# Patient Record
Sex: Male | Born: 1960 | Race: Black or African American | Hispanic: No | Marital: Single | Smoking: Former smoker
Health system: Southern US, Community
[De-identification: ages and names within clinical notes are randomized; demographics above are authoritative.]

## PROBLEM LIST (undated history)

## (undated) ENCOUNTER — Inpatient Hospital Stay: Admission: EM | Payer: Self-pay | Source: Home / Self Care

## (undated) DIAGNOSIS — G8929 Other chronic pain: Secondary | ICD-10-CM

## (undated) DIAGNOSIS — F419 Anxiety disorder, unspecified: Secondary | ICD-10-CM

## (undated) DIAGNOSIS — E7800 Pure hypercholesterolemia, unspecified: Secondary | ICD-10-CM

## (undated) DIAGNOSIS — K219 Gastro-esophageal reflux disease without esophagitis: Secondary | ICD-10-CM

## (undated) DIAGNOSIS — F32A Depression, unspecified: Secondary | ICD-10-CM

## (undated) DIAGNOSIS — D126 Benign neoplasm of colon, unspecified: Secondary | ICD-10-CM

## (undated) DIAGNOSIS — M109 Gout, unspecified: Secondary | ICD-10-CM

## (undated) DIAGNOSIS — R200 Anesthesia of skin: Secondary | ICD-10-CM

## (undated) DIAGNOSIS — F329 Major depressive disorder, single episode, unspecified: Secondary | ICD-10-CM

## (undated) DIAGNOSIS — G5793 Unspecified mononeuropathy of bilateral lower limbs: Secondary | ICD-10-CM

## (undated) DIAGNOSIS — M542 Cervicalgia: Secondary | ICD-10-CM

## (undated) DIAGNOSIS — M5416 Radiculopathy, lumbar region: Secondary | ICD-10-CM

## (undated) DIAGNOSIS — F1910 Other psychoactive substance abuse, uncomplicated: Secondary | ICD-10-CM

## (undated) DIAGNOSIS — I639 Cerebral infarction, unspecified: Secondary | ICD-10-CM

## (undated) DIAGNOSIS — K649 Unspecified hemorrhoids: Secondary | ICD-10-CM

## (undated) DIAGNOSIS — I10 Essential (primary) hypertension: Secondary | ICD-10-CM

## (undated) DIAGNOSIS — H538 Other visual disturbances: Secondary | ICD-10-CM

## (undated) DIAGNOSIS — M549 Dorsalgia, unspecified: Secondary | ICD-10-CM

## (undated) DIAGNOSIS — M1990 Unspecified osteoarthritis, unspecified site: Secondary | ICD-10-CM

## (undated) DIAGNOSIS — G629 Polyneuropathy, unspecified: Secondary | ICD-10-CM

## (undated) HISTORY — DX: Major depressive disorder, single episode, unspecified: F32.9

## (undated) HISTORY — DX: Gastro-esophageal reflux disease without esophagitis: K21.9

## (undated) HISTORY — DX: Other visual disturbances: H53.8

## (undated) HISTORY — PX: HAND SURGERY: SHX662

## (undated) HISTORY — DX: Other psychoactive substance abuse, uncomplicated: F19.10

## (undated) HISTORY — DX: Unspecified hemorrhoids: K64.9

## (undated) HISTORY — DX: Cervicalgia: M54.2

## (undated) HISTORY — DX: Dorsalgia, unspecified: M54.9

## (undated) HISTORY — PX: FINGER SURGERY: SHX640

## (undated) HISTORY — DX: Benign neoplasm of colon, unspecified: D12.6

## (undated) HISTORY — DX: Depression, unspecified: F32.A

## (undated) HISTORY — DX: Unspecified osteoarthritis, unspecified site: M19.90

---

## 1999-05-03 ENCOUNTER — Encounter: Payer: Self-pay | Admitting: Emergency Medicine

## 1999-05-03 ENCOUNTER — Emergency Department (HOSPITAL_COMMUNITY): Admission: EM | Admit: 1999-05-03 | Discharge: 1999-05-03 | Payer: Self-pay | Admitting: Emergency Medicine

## 2001-05-20 ENCOUNTER — Ambulatory Visit (HOSPITAL_COMMUNITY): Admission: RE | Admit: 2001-05-20 | Discharge: 2001-05-20 | Payer: Self-pay | Admitting: Unknown Physician Specialty

## 2001-05-20 ENCOUNTER — Encounter: Payer: Self-pay | Admitting: Family Medicine

## 2002-01-15 ENCOUNTER — Encounter: Payer: Self-pay | Admitting: Urology

## 2002-01-15 ENCOUNTER — Ambulatory Visit (HOSPITAL_COMMUNITY): Admission: RE | Admit: 2002-01-15 | Discharge: 2002-01-15 | Payer: Self-pay | Admitting: Urology

## 2004-04-05 ENCOUNTER — Ambulatory Visit: Payer: Self-pay | Admitting: Family Medicine

## 2004-04-05 ENCOUNTER — Ambulatory Visit (HOSPITAL_COMMUNITY): Admission: RE | Admit: 2004-04-05 | Discharge: 2004-04-05 | Payer: Self-pay | Admitting: Family Medicine

## 2004-07-18 ENCOUNTER — Inpatient Hospital Stay (HOSPITAL_COMMUNITY): Admission: EM | Admit: 2004-07-18 | Discharge: 2004-07-20 | Payer: Self-pay | Admitting: Emergency Medicine

## 2006-03-13 DIAGNOSIS — D126 Benign neoplasm of colon, unspecified: Secondary | ICD-10-CM

## 2006-03-13 HISTORY — DX: Benign neoplasm of colon, unspecified: D12.6

## 2006-04-09 ENCOUNTER — Ambulatory Visit: Payer: Self-pay | Admitting: Family Medicine

## 2006-04-17 ENCOUNTER — Encounter (INDEPENDENT_AMBULATORY_CARE_PROVIDER_SITE_OTHER): Payer: Self-pay | Admitting: *Deleted

## 2006-04-17 ENCOUNTER — Encounter: Payer: Self-pay | Admitting: Family Medicine

## 2006-04-17 LAB — CONVERTED CEMR LAB
ALT: 16 units/L (ref 0–53)
AST: 25 units/L (ref 0–37)
Alkaline Phosphatase: 69 units/L (ref 39–117)
BUN: 14 mg/dL (ref 6–23)
Basophils Relative: 1 % (ref 0–1)
Calcium: 9.4 mg/dL (ref 8.4–10.5)
Cholesterol: 246 mg/dL — ABNORMAL HIGH (ref 0–200)
Creatinine, Ser: 1.37 mg/dL (ref 0.40–1.50)
Eosinophils Absolute: 0.2 10*3/uL (ref 0.0–0.7)
Eosinophils Relative: 5 % (ref 0–5)
Hemoglobin, Urine: NEGATIVE
Indirect Bilirubin: 0.5 mg/dL (ref 0.0–0.9)
Ketones, ur: NEGATIVE mg/dL
MCHC: 33.6 g/dL (ref 30.0–36.0)
MCV: 88 fL (ref 78.0–100.0)
Monocytes Relative: 11 % (ref 3–11)
Neutrophils Relative %: 44 % (ref 43–77)
PSA: 0.24 ng/mL
Potassium: 4.8 meq/L (ref 3.5–5.3)
RBC: 4.93 M/uL (ref 4.22–5.81)
Specific Gravity, Urine: 1.016 (ref 1.005–1.03)
Total Protein: 8.1 g/dL (ref 6.0–8.3)
Triglycerides: 170 mg/dL — ABNORMAL HIGH (ref <150)
Urine Glucose: NEGATIVE mg/dL
pH: 5.5 (ref 5.0–8.0)

## 2006-05-21 ENCOUNTER — Ambulatory Visit: Payer: Self-pay | Admitting: Family Medicine

## 2006-07-25 ENCOUNTER — Encounter: Payer: Self-pay | Admitting: Family Medicine

## 2006-07-25 LAB — CONVERTED CEMR LAB
HDL: 60 mg/dL (ref >39)
LDL Cholesterol: 122 mg/dL — ABNORMAL HIGH (ref 0–99)
Total CHOL/HDL Ratio: 3.8
Triglycerides: 226 mg/dL — ABNORMAL HIGH (ref <150)

## 2006-07-26 ENCOUNTER — Encounter: Payer: Self-pay | Admitting: Family Medicine

## 2006-07-26 LAB — CONVERTED CEMR LAB
AST: 22 units/L (ref 0–37)
Albumin: 4.2 g/dL (ref 3.5–5.2)
Total Bilirubin: 0.6 mg/dL (ref 0.3–1.2)
Total Protein: 7.6 g/dL (ref 6.0–8.3)

## 2006-08-14 ENCOUNTER — Ambulatory Visit: Payer: Self-pay | Admitting: Family Medicine

## 2006-08-28 ENCOUNTER — Encounter: Payer: Self-pay | Admitting: Family Medicine

## 2006-08-28 LAB — CONVERTED CEMR LAB
Hemoglobin, Urine: NEGATIVE
Ketones, ur: NEGATIVE mg/dL
Leukocytes, UA: NEGATIVE
Nitrite: NEGATIVE
Specific Gravity, Urine: 1.016 (ref 1.005–1.03)
Urobilinogen, UA: 0.2 (ref 0.0–1.0)
pH: 5.5 (ref 5.0–8.0)

## 2006-10-04 ENCOUNTER — Ambulatory Visit: Payer: Self-pay | Admitting: Gastroenterology

## 2006-10-09 ENCOUNTER — Ambulatory Visit: Payer: Self-pay | Admitting: Gastroenterology

## 2006-10-09 ENCOUNTER — Ambulatory Visit (HOSPITAL_COMMUNITY): Admission: RE | Admit: 2006-10-09 | Discharge: 2006-10-09 | Payer: Self-pay | Admitting: Gastroenterology

## 2006-10-09 ENCOUNTER — Encounter: Payer: Self-pay | Admitting: Gastroenterology

## 2006-10-09 HISTORY — PX: COLONOSCOPY: SHX174

## 2006-11-08 ENCOUNTER — Encounter: Payer: Self-pay | Admitting: Family Medicine

## 2006-11-08 LAB — CONVERTED CEMR LAB
AST: 25 units/L (ref 0–37)
Albumin: 4.2 g/dL (ref 3.5–5.2)
Alkaline Phosphatase: 53 units/L (ref 39–117)
HDL: 59 mg/dL (ref >39)
Indirect Bilirubin: 0.5 mg/dL (ref 0.0–0.9)
LDL Cholesterol: 90 mg/dL (ref 0–99)
Total Bilirubin: 0.6 mg/dL (ref 0.3–1.2)

## 2006-11-14 ENCOUNTER — Ambulatory Visit: Payer: Self-pay | Admitting: Family Medicine

## 2006-12-03 ENCOUNTER — Ambulatory Visit: Payer: Self-pay | Admitting: Orthopedic Surgery

## 2007-03-14 ENCOUNTER — Encounter: Payer: Self-pay | Admitting: Family Medicine

## 2007-03-28 ENCOUNTER — Ambulatory Visit: Payer: Self-pay | Admitting: Family Medicine

## 2007-04-03 ENCOUNTER — Encounter: Payer: Self-pay | Admitting: Family Medicine

## 2007-04-03 LAB — CONVERTED CEMR LAB
ALT: 14 units/L (ref 0–53)
AST: 28 units/L (ref 0–37)
Albumin: 4.6 g/dL (ref 3.5–5.2)
Alkaline Phosphatase: 57 units/L (ref 39–117)
Basophils Absolute: 0 10*3/uL (ref 0.0–0.1)
Basophils Relative: 0 % (ref 0–1)
CO2: 23 meq/L (ref 19–32)
Calcium: 9.6 mg/dL (ref 8.4–10.5)
Cholesterol: 217 mg/dL — ABNORMAL HIGH (ref 0–200)
Creatinine, Ser: 1.24 mg/dL (ref 0.40–1.50)
Eosinophils Absolute: 0.2 10*3/uL (ref 0.0–0.7)
Eosinophils Relative: 4 % (ref 0–5)
HCT: 42 % (ref 39.0–52.0)
HDL: 71 mg/dL (ref >39)
Hemoglobin: 15 g/dL (ref 13.0–17.0)
MCHC: 35.7 g/dL (ref 30.0–36.0)
MCV: 86.1 fL (ref 78.0–100.0)
Monocytes Absolute: 0.5 10*3/uL (ref 0.1–1.0)
Platelets: 383 10*3/uL (ref 150–400)
RDW: 13.6 % (ref 11.5–15.5)
Sodium: 138 meq/L (ref 135–145)
TSH: 0.779 microintl units/mL (ref 0.350–5.50)
Total Bilirubin: 0.8 mg/dL (ref 0.3–1.2)
Total CHOL/HDL Ratio: 3.1

## 2007-04-15 ENCOUNTER — Ambulatory Visit: Payer: Self-pay | Admitting: Family Medicine

## 2007-04-20 ENCOUNTER — Ambulatory Visit (HOSPITAL_COMMUNITY): Admission: RE | Admit: 2007-04-20 | Discharge: 2007-04-20 | Payer: Self-pay | Admitting: Family Medicine

## 2007-05-08 ENCOUNTER — Ambulatory Visit: Payer: Self-pay | Admitting: Family Medicine

## 2007-05-17 ENCOUNTER — Ambulatory Visit: Payer: Self-pay | Admitting: Family Medicine

## 2007-07-10 ENCOUNTER — Encounter: Admission: RE | Admit: 2007-07-10 | Discharge: 2007-07-10 | Payer: Self-pay | Admitting: Orthopedic Surgery

## 2007-07-22 ENCOUNTER — Ambulatory Visit: Payer: Self-pay | Admitting: Family Medicine

## 2007-07-25 ENCOUNTER — Encounter (INDEPENDENT_AMBULATORY_CARE_PROVIDER_SITE_OTHER): Payer: Self-pay | Admitting: *Deleted

## 2007-07-25 ENCOUNTER — Ambulatory Visit (HOSPITAL_COMMUNITY): Admission: RE | Admit: 2007-07-25 | Discharge: 2007-07-25 | Payer: Self-pay | Admitting: Family Medicine

## 2007-07-25 ENCOUNTER — Encounter: Payer: Self-pay | Admitting: Family Medicine

## 2007-07-25 DIAGNOSIS — M542 Cervicalgia: Secondary | ICD-10-CM | POA: Insufficient documentation

## 2007-07-25 DIAGNOSIS — I10 Essential (primary) hypertension: Secondary | ICD-10-CM | POA: Insufficient documentation

## 2007-07-25 DIAGNOSIS — R109 Unspecified abdominal pain: Secondary | ICD-10-CM | POA: Insufficient documentation

## 2007-07-25 DIAGNOSIS — M549 Dorsalgia, unspecified: Secondary | ICD-10-CM | POA: Insufficient documentation

## 2007-07-25 DIAGNOSIS — E785 Hyperlipidemia, unspecified: Secondary | ICD-10-CM | POA: Insufficient documentation

## 2007-07-25 LAB — CONVERTED CEMR LAB
Albumin: 4.2 g/dL (ref 3.5–5.2)
BUN: 13 mg/dL (ref 6–23)
CO2: 22 meq/L (ref 19–32)
Chloride: 103 meq/L (ref 96–112)
HDL: 81 mg/dL (ref >39)
LDL Cholesterol: 72 mg/dL (ref 0–99)
Potassium: 4.8 meq/L (ref 3.5–5.3)
Total Bilirubin: 0.9 mg/dL (ref 0.3–1.2)
Total Protein: 7.4 g/dL (ref 6.0–8.3)
Triglycerides: 119 mg/dL (ref <150)
VLDL: 24 mg/dL (ref 0–40)

## 2007-07-26 ENCOUNTER — Encounter: Admission: RE | Admit: 2007-07-26 | Discharge: 2007-07-26 | Payer: Self-pay | Admitting: Orthopedic Surgery

## 2007-10-02 ENCOUNTER — Ambulatory Visit: Payer: Self-pay | Admitting: Family Medicine

## 2007-10-15 ENCOUNTER — Telehealth: Payer: Self-pay | Admitting: Family Medicine

## 2008-01-08 ENCOUNTER — Ambulatory Visit: Payer: Self-pay | Admitting: Family Medicine

## 2008-02-03 ENCOUNTER — Ambulatory Visit: Payer: Self-pay | Admitting: Family Medicine

## 2008-02-03 DIAGNOSIS — R209 Unspecified disturbances of skin sensation: Secondary | ICD-10-CM | POA: Insufficient documentation

## 2008-02-05 ENCOUNTER — Encounter: Payer: Self-pay | Admitting: Family Medicine

## 2008-02-17 LAB — CONVERTED CEMR LAB
ALT: <8 units/L (ref 0–53)
Albumin: 3.5 g/dL (ref 3.5–5.2)
Chloride: 103 meq/L (ref 96–112)
Cholesterol: 169 mg/dL (ref 0–200)
HDL: 47 mg/dL (ref >39)
Potassium: 3.2 meq/L — ABNORMAL LOW (ref 3.5–5.3)
Sodium: 141 meq/L (ref 135–145)
Total CHOL/HDL Ratio: 3.6
Total Protein: 6.3 g/dL (ref 6.0–8.3)
Triglycerides: 171 mg/dL — ABNORMAL HIGH (ref <150)
VLDL: 34 mg/dL (ref 0–40)

## 2008-02-19 ENCOUNTER — Encounter: Payer: Self-pay | Admitting: Family Medicine

## 2008-04-14 ENCOUNTER — Ambulatory Visit: Payer: Self-pay | Admitting: Family Medicine

## 2008-04-14 DIAGNOSIS — B353 Tinea pedis: Secondary | ICD-10-CM | POA: Insufficient documentation

## 2008-04-14 DIAGNOSIS — K5900 Constipation, unspecified: Secondary | ICD-10-CM | POA: Insufficient documentation

## 2008-05-22 ENCOUNTER — Telehealth: Payer: Self-pay | Admitting: Family Medicine

## 2008-05-26 ENCOUNTER — Ambulatory Visit: Payer: Self-pay | Admitting: Family Medicine

## 2008-05-30 DIAGNOSIS — F329 Major depressive disorder, single episode, unspecified: Secondary | ICD-10-CM | POA: Insufficient documentation

## 2008-06-16 ENCOUNTER — Ambulatory Visit: Payer: Self-pay | Admitting: Family Medicine

## 2008-06-16 LAB — CONVERTED CEMR LAB
Bilirubin Urine: NEGATIVE
Glucose, Urine, Semiquant: NEGATIVE
Specific Gravity, Urine: 1.015
Urobilinogen, UA: 0.2

## 2008-06-19 ENCOUNTER — Encounter: Payer: Self-pay | Admitting: Family Medicine

## 2008-06-30 ENCOUNTER — Ambulatory Visit: Payer: Self-pay | Admitting: Family Medicine

## 2008-06-30 DIAGNOSIS — M25529 Pain in unspecified elbow: Secondary | ICD-10-CM | POA: Insufficient documentation

## 2008-06-30 DIAGNOSIS — R599 Enlarged lymph nodes, unspecified: Secondary | ICD-10-CM | POA: Insufficient documentation

## 2008-07-17 ENCOUNTER — Encounter: Payer: Self-pay | Admitting: Family Medicine

## 2008-07-17 ENCOUNTER — Emergency Department (HOSPITAL_COMMUNITY): Admission: EM | Admit: 2008-07-17 | Discharge: 2008-07-17 | Payer: Self-pay | Admitting: Emergency Medicine

## 2008-07-20 ENCOUNTER — Ambulatory Visit: Payer: Self-pay | Admitting: Family Medicine

## 2008-07-20 DIAGNOSIS — J029 Acute pharyngitis, unspecified: Secondary | ICD-10-CM | POA: Insufficient documentation

## 2008-08-17 ENCOUNTER — Telehealth: Payer: Self-pay | Admitting: Family Medicine

## 2008-10-09 ENCOUNTER — Encounter: Payer: Self-pay | Admitting: Family Medicine

## 2008-11-18 ENCOUNTER — Ambulatory Visit: Payer: Self-pay | Admitting: Family Medicine

## 2008-11-18 DIAGNOSIS — M25519 Pain in unspecified shoulder: Secondary | ICD-10-CM | POA: Insufficient documentation

## 2008-11-18 DIAGNOSIS — M25569 Pain in unspecified knee: Secondary | ICD-10-CM | POA: Insufficient documentation

## 2008-12-15 ENCOUNTER — Ambulatory Visit: Payer: Self-pay | Admitting: Orthopedic Surgery

## 2008-12-15 DIAGNOSIS — M25819 Other specified joint disorders, unspecified shoulder: Secondary | ICD-10-CM | POA: Insufficient documentation

## 2008-12-15 DIAGNOSIS — M7580 Other shoulder lesions, unspecified shoulder: Secondary | ICD-10-CM

## 2008-12-29 ENCOUNTER — Ambulatory Visit: Payer: Self-pay | Admitting: Orthopedic Surgery

## 2008-12-29 DIAGNOSIS — M2240 Chondromalacia patellae, unspecified knee: Secondary | ICD-10-CM | POA: Insufficient documentation

## 2009-01-20 ENCOUNTER — Telehealth: Payer: Self-pay | Admitting: Orthopedic Surgery

## 2009-01-20 ENCOUNTER — Telehealth: Payer: Self-pay | Admitting: Family Medicine

## 2009-01-21 ENCOUNTER — Encounter: Payer: Self-pay | Admitting: Orthopedic Surgery

## 2009-12-09 ENCOUNTER — Encounter (INDEPENDENT_AMBULATORY_CARE_PROVIDER_SITE_OTHER): Payer: Self-pay

## 2009-12-09 ENCOUNTER — Ambulatory Visit: Payer: Self-pay | Admitting: Family Medicine

## 2009-12-09 DIAGNOSIS — N63 Unspecified lump in unspecified breast: Secondary | ICD-10-CM | POA: Insufficient documentation

## 2009-12-15 ENCOUNTER — Ambulatory Visit (HOSPITAL_COMMUNITY): Admission: RE | Admit: 2009-12-15 | Discharge: 2009-12-15 | Payer: Self-pay | Admitting: Family Medicine

## 2010-03-10 ENCOUNTER — Encounter: Payer: Self-pay | Admitting: Family Medicine

## 2010-03-10 ENCOUNTER — Ambulatory Visit: Payer: Self-pay | Admitting: Family Medicine

## 2010-03-11 DIAGNOSIS — F1010 Alcohol abuse, uncomplicated: Secondary | ICD-10-CM | POA: Insufficient documentation

## 2010-03-11 LAB — CONVERTED CEMR LAB
ALT: 18 units/L (ref 0–53)
Albumin: 4.5 g/dL (ref 3.5–5.2)
Basophils Relative: 1 % (ref 0–1)
Cholesterol: 208 mg/dL — ABNORMAL HIGH (ref 0–200)
Eosinophils Absolute: 0.1 10*3/uL (ref 0.0–0.7)
Glucose, Bld: 86 mg/dL (ref 70–99)
Lymphs Abs: 2.1 10*3/uL (ref 0.7–4.0)
MCV: 85.6 fL (ref 78.0–100.0)
Neutrophils Relative %: 45 % (ref 43–77)
PSA: 0.43 ng/mL (ref <=4.00)
Platelets: 365 10*3/uL (ref 150–400)
Potassium: 4.9 meq/L (ref 3.5–5.3)
Sodium: 139 meq/L (ref 135–145)
TSH: 0.981 microintl units/mL (ref 0.350–4.500)
Total CHOL/HDL Ratio: 3
Total Protein: 7.9 g/dL (ref 6.0–8.3)
Triglycerides: 182 mg/dL — ABNORMAL HIGH (ref <150)
VLDL: 36 mg/dL (ref 0–40)
WBC: 4.7 10*3/uL (ref 4.0–10.5)

## 2010-04-03 ENCOUNTER — Encounter: Payer: Self-pay | Admitting: Family Medicine

## 2010-04-12 NOTE — Letter (Signed)
Summary: PHONE NOTES  PHONE NOTES   Imported By: Lind Guest 08/06/2009 15:27:28  _____________________________________________________________________  External Attachment:    Type:   Image     Comment:   External Document

## 2010-04-12 NOTE — Assessment & Plan Note (Signed)
Summary: lump- room 1   Vital Signs:  Patient profile:   50 year old male Height:      74 inches Weight:      201.75 pounds BMI:     26.00 O2 Sat:      98 % on Room air Pulse rate:   88 / minute Resp:     16 per minute BP sitting:   130 / 80  (left arm)  Vitals Entered By: Adella Hare LPN (December 09, 2009 1:50 PM) CC: lump on left chest Is Patient Diabetic? No Pain Assessment Patient in pain? no        Referring Provider:  Dr Lodema Hong   CC:  lump on left chest.  History of Present Illness: Pt discovered tender lump Lt breast area approx 1 1/2 weeks ago.  No change in size since discovered.  No nipple dischg.  No FH of breast CA.  Hx of htn. Taking medications as prescribed and denies side effects.  No headache, chest pain or palpitations. Also Hx of Hyperlipidemia.  Taking chol meds also. Is overdue for labs.  No ins currently, but states he will have some in 2-3 mos.     Allergies (verified): No Known Drug Allergies  Past History:  Past medical history reviewed for relevance to current acute and chronic problems.  Past Medical History: Reviewed history from 07/25/2007 and no changes required. Current Problems:  NECK PAIN (ICD-723.1) BACK PAIN (ICD-724.5) ABDOMINAL PAIN (ICD-789.00) HYPERLIPIDEMIA (ICD-272.4) HYPERTENSION (ICD-401.9)  Review of Systems General:  Denies chills and fever. CV:  Denies chest pain or discomfort, lightheadness, and palpitations. Resp:  Denies cough and shortness of breath.  Physical Exam  General:  Well-developed,well-nourished,in no acute distress; alert,appropriate and cooperative throughout examination Head:  Normocephalic and atraumatic without obvious abnormalities. No apparent alopecia or balding. Ears:  External ear exam shows no significant lesions or deformities.  Otoscopic examination reveals clear canals, tympanic membranes are intact bilaterally without bulging, retraction, inflammation or discharge. Hearing is  grossly normal bilaterally. Nose:  External nasal examination shows no deformity or inflammation. Nasal mucosa are pink and moist without lesions or exudates. Mouth:  Oral mucosa and oropharynx without lesions or exudates.  Teeth in good repair. Neck:  No deformities, masses, or tenderness noted. Breasts:  gynecomastia bilat and L palpable mass approx 2 cm irrg, mobile,inferior to areola. Lungs:  Normal respiratory effort, chest expands symmetrically. Lungs are clear to auscultation, no crackles or wheezes. Heart:  Normal rate and regular rhythm. S1 and S2 normal without gallop, murmur, click, rub or other extra sounds. Cervical Nodes:  No lymphadenopathy noted Axillary Nodes:  No palpable lymphadenopathy Psych:  Cognition and judgment appear intact. Alert and cooperative with normal attention span and concentration. No apparent delusions, illusions, hallucinations   Impression & Recommendations:  Problem # 1:  BREAST MASS, LEFT (ICD-611.72) Assessment New  Orders: Mammogram (Screening) (Mammo) Miscellaneous Other Radiology (Misc Other Rad)  Problem # 2:  HYPERTENSION (ICD-401.9) Assessment: Comment Only  His updated medication list for this problem includes:    Lisinopril-hydrochlorothiazide 10-12.5 Mg Tabs (Lisinopril-hydrochlorothiazide) ..... One tab by mouth once daily  BP today: 130/80 Prior BP: 116/70 (11/18/2008)  Labs Reviewed: K+: 3.2 (02/05/2008) Creat: : 1.07 (02/05/2008)   Chol: 169 (02/05/2008)   HDL: 47 (02/05/2008)   LDL: 88 (02/05/2008)   TG: 171 (02/05/2008)  Problem # 3:  HYPERLIPIDEMIA (ICD-272.4) Assessment: Comment Only  His updated medication list for this problem includes:    Mevacor 20 Mg Tabs (Lovastatin) .Marland KitchenMarland KitchenMarland KitchenMarland Kitchen  One tab by mouth at bedtime  Labs Reviewed: SGOT: 15 (02/05/2008)   SGPT: <8 U/L (02/05/2008)   HDL:47 (02/05/2008), 81 (07/25/2007)  LDL:88 (02/05/2008), 72 (07/25/2007)  Chol:169 (02/05/2008), 177 (07/25/2007)  Trig:171 (02/05/2008), 119  (07/25/2007)  Complete Medication List: 1)  Lisinopril-hydrochlorothiazide 10-12.5 Mg Tabs (Lisinopril-hydrochlorothiazide) .... One tab by mouth once daily 2)  Mevacor 20 Mg Tabs (Lovastatin) .... One tab by mouth at bedtime  Patient Instructions: 1)  Please schedule a follow-up appointment in 2 months. 2)  I have ordered a mammogram and ultrasound of your Lt breast. 3)  I have refilled your blood pressure and cholesterol medication. 4)  You will need to have blood work in a couple of months when your health insurance starts. Prescriptions: MEVACOR 20 MG  TABS (LOVASTATIN) one tab by mouth at bedtime  #90 x 0   Entered and Authorized by:   Esperanza Sheets PA   Signed by:   Esperanza Sheets PA on 12/09/2009   Method used:   Electronically to        [facility]  [address] (retail)       [address]       [place], [place]  [address]       Ph: [phone]       Fax: [phone]   RxID:   [id number] LISINOPRIL-HYDROCHLOROTHIAZIDE 10-12.5 MG  TABS (LISINOPRIL-HYDROCHLOROTHIAZIDE) one tab by mouth once daily  #90 x 0   Entered and Authorized by:   Esperanza Sheets PA   Signed by:   Esperanza Sheets PA on 12/09/2009   Method used:   Electronically to        [facility]  [address] (retail)       [address]       [place], [place]  [address]       Ph: [phone]       Fax: [phone]   RxID:   [id number]

## 2010-04-12 NOTE — Letter (Signed)
Summary: CONSULTS  CONSULTS   Imported By: Lind Guest 08/06/2009 15:23:42  _____________________________________________________________________  External Attachment:    Type:   Image     Comment:   External Document

## 2010-04-12 NOTE — Letter (Signed)
Summary: X RAYS  X RAYS   Imported By: Lind Guest 08/06/2009 15:28:09  _____________________________________________________________________  External Attachment:    Type:   Image     Comment:   External Document

## 2010-04-12 NOTE — Letter (Signed)
Summary: OFFICE NOTES  OFFICE NOTES   Imported By: Lind Guest 08/06/2009 15:23:13  _____________________________________________________________________  External Attachment:    Type:   Image     Comment:   External Document

## 2010-04-12 NOTE — Letter (Signed)
Summary: DEMO  DEMO   Imported By: Lind Guest 08/06/2009 15:24:07  _____________________________________________________________________  External Attachment:    Type:   Image     Comment:   External Document

## 2010-04-12 NOTE — Letter (Signed)
Summary: Work note fax  Work note fax   Imported By: Cammie Sickle 04/06/2009 10:34:55  _____________________________________________________________________  External Attachment:    Type:   Image     Comment:   External Document

## 2010-04-12 NOTE — Letter (Signed)
Summary: Work Excuse  [facility]  [address]   [place], [place] [address]   Phone: [phone]  Fax: [phone]    Today's Date: December 09, 2009  Name of Patient: Tristan Cook  The above named patient had a medical visit today at: 1:30pm  Please take this into consideration when reviewing the time away from work/school.    Special Instructions:  [*] None  [  ] To be off the remainder of today, returning to the normal work / school schedule tomorrow.  [  ] To be off until the next scheduled appointment on ______________________.  [  ] Other ________________________________________________________________ ________________________________________________________________________   Sincerely yours,   Esperanza Sheets, PA-C

## 2010-04-12 NOTE — Letter (Signed)
Summary: HISTORY AND PHYSICAL  HISTORY AND PHYSICAL   Imported By: Lind Guest 08/06/2009 15:24:43  _____________________________________________________________________  External Attachment:    Type:   Image     Comment:   External Document

## 2010-04-12 NOTE — Letter (Signed)
Summary: LABS  LABS   Imported By: Lind Guest 08/06/2009 15:31:15  _____________________________________________________________________  External Attachment:    Type:   Image     Comment:   External Document

## 2010-04-12 NOTE — Letter (Signed)
Summary: MISC  MISC   Imported By: Lind Guest 08/06/2009 15:26:38  _____________________________________________________________________  External Attachment:    Type:   Image     Comment:   External Document
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
## 2010-07-26 NOTE — Op Note (Signed)
[name], [name]              ACCOUNT NO.:  [id number]   MEDICAL RECORD NO.:  [id number]          PATIENT TYPE:  AMB   LOCATION:  DAY                           FACILITY:  [facility]   PHYSICIAN:  Kassie Mends, M.D.      DATE OF BIRTH:  1960-05-05   DATE OF PROCEDURE:  10/09/2006  DATE OF DISCHARGE:                               OPERATIVE REPORT   PROCEDURE:  Colonoscopy with snare cautery polypectomy.   INDICATION FOR EXAM:  Tristan [name] is a 50 year old male whose brother  had colon cancer in his 54s.  He presents with heme-positive stool.   FINDINGS:  1. 3-mm pedunculated sigmoid colon polyp removed via snare cautery.  2. An 8-mm sessile hepatic flexure polyp removed via snare cautery.  3. A 6-mm descending colon polyp removed via snare cautery.  4. Small internal hemorrhoids.  5. Otherwise no masses, inflammatory changes, diverticula, or      arteriovenous malformations seen.   RECOMMENDATIONS:  1. No aspirin, NSAIDs or anticoagulation for 7 days.  2. He should follow a high fiber diet.  He was given a handout on high-      fiber diet and polyps, as well as hemorrhoids.  3. We will call Tristan [name] with results of his biopsies.  If his      polyp is adenomatous, then his children should have a screening      colonoscopy at age 3.  4. Screening colonoscopy in 5 years because he has a first-degree      relative who had colon cancer at age less than 62.   MEDICATIONS:  1. Demerol 50 mg IV.  2. Versed 5 mg IV.   PROCEDURE TECHNIQUE:  Physical exam was performed; and informed consent  was obtained from the patient after explaining the benefits, risks, and  alternatives to the procedure.  The patient was connected to the monitor  and placed in the left lateral position.  Continuous oxygen was provided  by nasal cannula and IV medicine administered through an indwelling  cannula.  After administration of sedation and rectal exam, the  patient's rectum was intubated  with the  scope and it was advanced onder direct visualization to the  cecum.  The scope was withdrawn slowly by carefully examining the color,  texture, anatomy, and integrity of the mucosa on the way out.  The  patient was recovered in endoscopy, and discharged home in satisfactory  condition.      Kassie Mends, M.D.  Electronically Signed     SM/MEDQ  D:  10/09/2006  T:  10/09/2006  Job:  [id number]   cc:   Milus Mallick. Lodema Hong, M.D.  Fax: [phone]

## 2010-07-26 NOTE — Consult Note (Signed)
NAMERIORDAN, WALLE              ACCOUNT NO.:  [id number]   MEDICAL RECORD NO.:  [id number]          PATIENT TYPE:  AMB   LOCATION:                                FACILITY:  [facility]   PHYSICIAN:  Kassie Mends, M.D.      DATE OF BIRTH:  Sep 03, 1960   DATE OF CONSULTATION:  10/04/2006  DATE OF DISCHARGE:                                 CONSULTATION   REASON FOR CONSULTATION:  Heme-positive stool.   HISTORY OF PRESENT ILLNESS:  Mr. Raffield is a 50 year old male who has a  history of hemorrhoids if he eats hot food.  He had Hemoccults checked  and they were positive.  He has a brother who is in his 31s with  colorectal cancer.  He denies any blood in stool, black tarry stools,  problems swallowing, nausea, vomiting, diarrhea, constipation or change  in his bowel habits.  He is having a bowel movement one to two times a  day.  He denies any aspirin, BC, Goody powder or NSAID use.   PAST MEDICAL HISTORY:  1. Hypertension.  2. Hyperlipidemia.  3. Hemorrhoids.   PAST SURGICAL HISTORY:  None.   ALLERGIES:  NO KNOWN DRUG ALLERGIES.   MEDICATIONS:  1. Lisinopril/HCTZ 10/12.5 daily.  2. Lovastatin 20 mg q.h.s.  3. Tylenol Extra Strength as needed.   FAMILY HISTORY:  He has no family history of breast, uterine or ovarian  cancer.   SOCIAL HISTORY:  He is separated.  He works of Radio producer.  He does not  smoke.  He drinks approximately a six pack on the weekends.   PHYSICAL EXAMINATION:  VITAL SIGNS:  Weight 200 pounds, height 6 feet 1  inch, temperature 93, blood pressure 120/80, pulse 80.  GENERAL:  He is in no apparent distress, alert and orient x4.  HEENT:  Exam is atraumatic, normocephalic.  Pupils equal, reactive to  light.  Mouth no oral lesions.  Posterior pharynx without erythema or  exudate.  NECK:  Full range of motion.  No lymphadenopathy.  LUNGS:  Clear to auscultation bilaterally.  CARDIOVASCULAR:  Regular rhythm, no murmur, normal S1-S2.  ABDOMEN:  Bowel sounds are  present, soft, nontender, nondistended.  No  rebound or guarding.  No hepatosplenomegaly, abdominal bruits or  pulsatile masses.  EXTREMITIES:  Without cyanosis, clubbing or edema.   LABORATORY DATA:  February 2008: hemoglobin 14.6, platelets 411, BUN 14,  creatinine 1.37, albumin 4.2 in May 2008 as well as total bili 0.6, alk  phos 57, AST 22, ALT 13.   ASSESSMENT:  Mr. Pursley is a 50 year old male with a first-degree  relative who had colon cancer age less than 50.  His colon cancer  screening is delayed.  He also has heme-positive stools.  Thank you for  allowing me to see Mr. Koury in consultation.  My recommendations  follow.   RECOMMENDATIONS:  1. Will set Mr. Pelzer up for a screening colonoscopy.  He will      likely need to have a colonoscopy every 5 years unless something      else is revealed on his colonoscopy.  2. He is a return patient visit to see me as needed.      Kassie Mends, M.D.  Electronically Signed     SM/MEDQ  D:  10/04/2006  T:  10/05/2006  Job:  [id number]   cc:   Milus Mallick. Lodema Hong, M.D.  Fax: [phone]

## 2010-07-29 NOTE — H&P (Signed)
NAME:  Tristan Cook, Tristan Cook              ACCOUNT NO.:  [id number]   MEDICAL RECORD NO.:  [id number]          PATIENT TYPE:  EMS   LOCATION:  ED                            FACILITY:  [facility]   PHYSICIAN:  Mobolaji B. Bakare, M.D.DATE OF BIRTH:  1960/06/21   DATE OF ADMISSION:  07/18/2004  DATE OF DISCHARGE:  LH                                HISTORY & PHYSICAL   PRIMARY CARE PHYSICIAN:  Dr. Lodema Hong.   CHIEF COMPLAINT:  Pleuritic left-sided chest pain.   HISTORY OF PRESENTING COMPLAINT:  Tristan Cook is a 50 year old African-  American male with no significant past medical history.  He noticed earlier  today left pleuritic, left-sided chest pain.  The pain is rated 8-10/10 and  is aggravated by movement and breathing.  He denies any cough or fever;  however, he had some chills earlier today.  No headaches.  No abdominal  pain.  No diarrhea or vomiting.  Denies hemoptysis, and no weight loss.  He  has about a 28 pack year of smoking.  He was evaluated in the emergency  department with a CT scan to rule out PE, sequel to a mildly positive D-  dimer.  Chest CT scan did not show any PE; however, there were infiltrates  on the left side more than right, suggestive of pneumonia right in the left  lower lobe.  He was given 1 gm of Rocephin and 500 mg of Zithromax p.o.   Pain seems to be the issue with Tristan Cook.  He needs to be admitted for  pain control, and, in addition, he does not have insurance.  He cannot  afford to buy any prescription medications at this point, he is in between  getting a job.  At the moment, he is unemployed.  Hence, he will need to be  seen by a Child psychotherapist in the morning for financial assistance regarding  medications and treatment.   PAST MEDICAL HISTORY:  None.   MEDICATIONS:  None.   ALLERGIES:  None.   FAMILY HISTORY:  He is married and has 2 children.  His father had a stroke  at the age of 12.  The patient's mom is healthy.  All other siblings have no  known medical ailment.   SOCIAL HISTORY:  He has been smoking about an average of one pack per day  since the age of 37.  He does not drink alcohol and does not use drugs.   REVIEW OF SYSTEMS:  The patient has shortness of breath.  There is no  history of sick contact at home.   PHYSICAL EXAMINATION:  VITAL SIGNS:  The vitals on arrival in the ER  revealed a temperature of 99, blood pressure 149/86, pulse 103, respiratory  rate 12, O2 saturations of 99%.  GENERAL:  The patient is alert and oriented to time, place, and person.  He  seems to be uncomfortable secondary to pain.  HEENT:  Normocephalic and atraumatic.  Not pale, anicteric.  NECK:  No elevated JVD.  No carotid bruit.  LUNGS:  Reduced air entry bibasilarly, more on the left than  on the right.  Some rales on the left lower base, but no crackles, no wheeze, no rhonchi.  CVS:  S1 and S2 regular.  No murmur, no gallop.  Tachycardic.  ABDOMEN:  Not distended, soft, nontender.  He hepatosplenomegaly.  EXTREMITIES:  No pedal edema.  No calf tenderness.  Dorsalis pedis pulse is  2+ bilaterally.  CNS:  No focal neurological deficit.  SKIN:  No rash, no petechiae.   INITIAL LABORATORY DATA:  ABG on room air revealed a pH of 7.46, PCO2 of 30,  PO2 of 84, bicarb 21, O2 saturations 97%.  D-dimer 0.61.  White cells 14.4,  hemoglobin 14.8, hematocrit 42.0, neutrophils 80%, lymphocytes 10%,  monocytes 10%.  Sodium 135, potassium 4.0, chloride 101, CO2 of 25, glucose  99, BUN 7, creatinine 1.1, calcium 9.4.  Chest CT revealed no acute PE.  Left greater than right lower lobe infiltrate suspicious for pneumonia.  Tiny left pleural effusion.   ASSESSMENT AND PLAN:  Tristan Cook is a 50 year old African-American male  presenting with chills, shortness of breath, leukocytosis, and chest CT  scan, suggestive of pneumonia bilaterally.  In addition, the patient lacks  the financial means to obtain medications, and has a significant history of   smoking.  1.  Community acquired pneumonia.  Continue Rocephin 1 gm IV daily,      Zithromax 500 mg IV daily, Tylenol 650 mg q.6h. p.r.n. for headache.      Blood culture x1 set now, and blood culture with temperature greater      than 100.6.  2.  Pleuritic chest pain secondary to above.  Use Percocet 5/325, 1-2      tablets p.o. q.3-4h. p.r.n.  Check EKG.  3.  Tobacco abuse.  Smoking cessation counseling.  The patient is willing to      quite smoking.  Will ask counselor to see, and also start nicotine patch      21 mg topical daily.  4.  Deep vein thrombosis prophylaxis.  Use Lovenox 40 mg subcutaneous daily.  5.  I will ask the social worker to see the patient regarding financial      assistance for procurement of medications on discharge.  6.  Elevated Blood Pressure - Probably secondary to pain. will monitor and      if persistent will start antihypertensives.      MBB/MEDQ  D:  07/18/2004  T:  07/18/2004  Job:  [id number]

## 2010-07-29 NOTE — Discharge Summary (Signed)
Tristan Cook, Tristan Cook              ACCOUNT NO.:  [id number]   MEDICAL RECORD NO.:  [id number]          PATIENT TYPE:  INP   LOCATION:  A322                          FACILITY:  [facility]   PHYSICIAN:  Osvaldo Shipper, MD     DATE OF BIRTH:  05/19/1960   DATE OF ADMISSION:  07/18/2004  DATE OF DISCHARGE:  05/10/2006LH                                 DISCHARGE SUMMARY   DISCHARGE DIAGNOSES:  1.  Community-acquired pneumonia.  2.  Hypertension.  3.  Nicotine abuse.   Please review H&P dictated on Jul 18, 2004 by Dr. Corky Downs for details  regarding patient's presenting illness.   BRIEF HOSPITAL COURSE:  Problem 1. COMMUNITY-ACQUIRED PNEUMONIA.  Patient  was admitted with symptoms of pleuritic left-sided chest pain.  He underwent  a CAT scan in the ER to rule out PE in which they found bilateral lower lobe  pneumonia.  CAT scan did not show any PE or any other concerning lesions.  Patient was put on antibiotics.  Patient was admitted mainly for pain  control issues because of a severe pleuritic chest pain.  On the day of  discharge patient's symptoms have improved.  He is actually ambulating down  the hall as well as going down for a cigarette smoke.  He admits that the  pain has improved significantly.  He continues to have some cough and one  episode of cough had specks of blood but nothing since then.  Patient denies  any shortness of breath or any fevers at this time.  He is saturating well  on room air and he is considered okay for discharge.   Problem 2. HYPERTENSION.  His blood pressure was found to be elevated in the  emergency room and it stayed elevated during his hospital stay.  He was  started on hydrochlorothiazide; his blood pressure is better controlled  though not completely optimal.  He will need to follow with his primary  medical doctor for any further adjustment to his medications.   Problem 3. SMOKING.  Patient was counseled about smoking cessation.  He says  he is ready  to quit smoking and requested nicotine patches.  I have  prescribed these for the patient at this time.   Problem 4. FINANCIAL PROBLEMS.  Patient does not have insurance and he  requires help with buying medications as well as with the hospital bill.  Financial counselor will see the patient prior to his discharge.   DISCHARGE MEDICATIONS:  1.  Levofloxacin 500 mg p.o. daily for 8 more days.  2.  Motrin 600 mg p.o. t.i.d. with meals for pain p.r.n.  3.  Hydrochlorothiazide 12.5 mg p.o. daily.  4.  Nicotine patch 21 mg per day.   DISCHARGE FOLLOWUP INSTRUCTIONS:  1.  Dr. Syliva Overman in 1-2 weeks to follow up on his pneumonia as well      as on blood pressure.  2.  Diet:  Low salt diet.  3.  Physical activity:  As before.   PROCEDURES PERFORMED THIS HOSPITAL STAY INCLUDE:  CT chest to rule out PE  which was ruled out  however, it showed airspace disease in both lung bases  suspicious for pneumonia.  Patient also underwent a chest x-ray which also  showed similar findings.   DISCHARGE INSTRUCTIONS:  Patient was told that if he developed high fever  which is not resolving with Tylenol or if he develops severe chest pains or  anymore moderate to large quantities of blood in his sputum he needs to call  his PMD or seek medical attention immediately.      GK/MEDQ  D:  07/20/2004  T:  07/20/2004  Job:  [id number]   cc:   Milus Mallick. Lodema Hong, M.D.  [address]  [place], [place] [address]  Fax: [phone]

## 2010-08-04 ENCOUNTER — Encounter: Payer: Self-pay | Admitting: Family Medicine

## 2010-08-09 ENCOUNTER — Ambulatory Visit: Payer: Self-pay | Admitting: Family Medicine

## 2010-10-11 ENCOUNTER — Other Ambulatory Visit: Payer: Self-pay | Admitting: Family Medicine

## 2010-10-18 ENCOUNTER — Telehealth: Payer: Self-pay | Admitting: Family Medicine

## 2010-10-18 MED ORDER — LOVASTATIN 20 MG PO TABS: 20.0000 mg | ORAL_TABLET | Freq: Every day | ORAL | Status: DC

## 2010-10-18 MED ORDER — LISINOPRIL-HYDROCHLOROTHIAZIDE 10-12.5 MG PO TABS: 1.0000 | ORAL_TABLET | Freq: Every day | ORAL | Status: DC

## 2010-10-18 NOTE — Telephone Encounter (Signed)
Sent in refills 

## 2010-10-20 ENCOUNTER — Emergency Department (HOSPITAL_COMMUNITY): Payer: Self-pay

## 2010-10-20 ENCOUNTER — Emergency Department (HOSPITAL_COMMUNITY)
Admission: EM | Admit: 2010-10-20 | Discharge: 2010-10-20 | Disposition: A | Discharge status: Home / Self Care | Payer: Self-pay | Attending: Emergency Medicine | Admitting: Emergency Medicine

## 2010-10-20 ENCOUNTER — Encounter: Payer: Self-pay | Admitting: *Deleted

## 2010-10-20 DIAGNOSIS — I10 Essential (primary) hypertension: Secondary | ICD-10-CM | POA: Insufficient documentation

## 2010-10-20 DIAGNOSIS — R7402 Elevation of levels of lactic acid dehydrogenase (LDH): Secondary | ICD-10-CM | POA: Insufficient documentation

## 2010-10-20 DIAGNOSIS — R110 Nausea: Secondary | ICD-10-CM

## 2010-10-20 DIAGNOSIS — R630 Anorexia: Secondary | ICD-10-CM | POA: Insufficient documentation

## 2010-10-20 DIAGNOSIS — R7401 Elevation of levels of liver transaminase levels: Secondary | ICD-10-CM | POA: Insufficient documentation

## 2010-10-20 DIAGNOSIS — R748 Abnormal levels of other serum enzymes: Secondary | ICD-10-CM

## 2010-10-20 DIAGNOSIS — E7800 Pure hypercholesterolemia, unspecified: Secondary | ICD-10-CM | POA: Insufficient documentation

## 2010-10-20 DIAGNOSIS — N3000 Acute cystitis without hematuria: Secondary | ICD-10-CM | POA: Insufficient documentation

## 2010-10-20 DIAGNOSIS — K449 Diaphragmatic hernia without obstruction or gangrene: Secondary | ICD-10-CM | POA: Insufficient documentation

## 2010-10-20 HISTORY — DX: Essential (primary) hypertension: I10

## 2010-10-20 HISTORY — DX: Pure hypercholesterolemia, unspecified: E78.00

## 2010-10-20 LAB — DIFFERENTIAL
Basophils Relative: 0 % (ref 0–1)
Lymphs Abs: 1.7 10*3/uL (ref 0.7–4.0)
Monocytes Relative: 9 % (ref 3–12)
Neutro Abs: 8.6 10*3/uL — ABNORMAL HIGH (ref 1.7–7.7)
Neutrophils Relative %: 76 % (ref 43–77)

## 2010-10-20 LAB — COMPREHENSIVE METABOLIC PANEL
ALT: 170 U/L — ABNORMAL HIGH (ref 0–53)
AST: 418 U/L — ABNORMAL HIGH (ref 0–37)
Albumin: 2.6 g/dL — ABNORMAL LOW (ref 3.5–5.2)
Alkaline Phosphatase: 190 U/L — ABNORMAL HIGH (ref 39–117)
BUN: 8 mg/dL (ref 6–23)
Chloride: 98 mEq/L (ref 96–112)
Potassium: 4 mEq/L (ref 3.5–5.1)
Sodium: 134 mEq/L — ABNORMAL LOW (ref 135–145)
Total Bilirubin: 1.3 mg/dL — ABNORMAL HIGH (ref 0.3–1.2)

## 2010-10-20 LAB — CBC
Hemoglobin: 12.7 g/dL — ABNORMAL LOW (ref 13.0–17.0)
MCHC: 36 g/dL (ref 30.0–36.0)
Platelets: 155 10*3/uL (ref 150–400)
RBC: 4.04 MIL/uL — ABNORMAL LOW (ref 4.22–5.81)

## 2010-10-20 LAB — URINALYSIS, ROUTINE W REFLEX MICROSCOPIC
Nitrite: POSITIVE — AB
Specific Gravity, Urine: 1.025 (ref 1.005–1.030)
pH: 5.5 (ref 5.0–8.0)

## 2010-10-20 LAB — URINE MICROSCOPIC-ADD ON

## 2010-10-20 MED ORDER — CIPROFLOXACIN IN D5W 400 MG/200ML IV SOLN
400.0000 mg | Freq: Once | INTRAVENOUS | Status: AC
  Administered 2010-10-20: 400 mg via INTRAVENOUS
  Filled 2010-10-20: qty 200

## 2010-10-20 MED ORDER — IOHEXOL 300 MG/ML  SOLN
100.0000 mL | Freq: Once | INTRAMUSCULAR | Status: AC | PRN
  Administered 2010-10-20: 100 mL via INTRAVENOUS

## 2010-10-20 MED ORDER — MORPHINE SULFATE 2 MG/ML IJ SOLN
2.0000 mg | Freq: Once | INTRAMUSCULAR | Status: AC
  Administered 2010-10-20: 2 mg via INTRAVENOUS
  Filled 2010-10-20: qty 1

## 2010-10-20 MED ORDER — SODIUM CHLORIDE 0.9 % IV SOLN
Freq: Once | INTRAVENOUS | Status: AC
  Administered 2010-10-20: 05:00:00 via INTRAVENOUS

## 2010-10-20 MED ORDER — AZITHROMYCIN 250 MG PO TABS
1000.0000 mg | ORAL_TABLET | Freq: Once | ORAL | Status: AC
  Administered 2010-10-20: 1000 mg via ORAL
  Filled 2010-10-20: qty 4

## 2010-10-20 MED ORDER — HYDROCODONE-ACETAMINOPHEN 5-500 MG PO TABS: 1.0000 | ORAL_TABLET | Freq: Four times a day (QID) | ORAL | Status: DC | PRN

## 2010-10-20 MED ORDER — CEFPODOXIME PROXETIL 200 MG PO TABS
400.0000 mg | ORAL_TABLET | Freq: Once | ORAL | Status: AC
  Administered 2010-10-20: 400 mg via ORAL
  Filled 2010-10-20: qty 2

## 2010-10-20 MED ORDER — DEXTROSE 5 % IV SOLN: 1.0000 g | INTRAVENOUS | Status: DC

## 2010-10-20 MED ORDER — ONDANSETRON HCL 4 MG PO TABS: 8.0000 mg | ORAL_TABLET | Freq: Three times a day (TID) | ORAL | Status: DC | PRN

## 2010-10-20 MED ORDER — ONDANSETRON HCL 4 MG/2ML IJ SOLN
4.0000 mg | Freq: Once | INTRAMUSCULAR | Status: AC
  Administered 2010-10-20: 4 mg via INTRAVENOUS
  Filled 2010-10-20: qty 2

## 2010-10-20 MED ORDER — CIPROFLOXACIN HCL 500 MG PO TABS: 500.0000 mg | ORAL_TABLET | Freq: Two times a day (BID) | ORAL | Status: AC

## 2010-10-20 NOTE — Progress Notes (Signed)
Care/disposition to Dr. Patria Mane.

## 2010-10-20 NOTE — ED Notes (Signed)
Patient with abd pain and vomiting at times and poor PO, this morning woke up with penile discharge yellow in color, admits to one sexual partner

## 2010-10-20 NOTE — ED Notes (Signed)
Patient with no complaints at this time. Respirations even and unlabored. Skin warm/dry. Discharge instructions reviewed with patient at this time. Patient given opportunity to voice concerns/ask questions. IV removed per policy and band-aid applied to site. Patient discharged at this time and left Emergency Department with steady gait.  

## 2010-10-20 NOTE — ED Provider Notes (Addendum)
History     CSN: [id number] Arrival date & time: 10/20/2010  2:16 AM  Chief Complaint  Patient presents with  . Penile Discharge  . Abdominal Pain  . Emesis   HPI Comments: Seen 230. Patient with lower abdominal pain x 2 weeks associated with some weight loss. Unable to quantify weight loss. States has had nausea and vomiting especially after meals. Denies fever, chills, chest pain, shortness of breath, night sweats. Noticed  penile discharge yesterday. Single partner, no h/o STD.  Patient is a 50 y.o. male presenting with penile discharge, abdominal pain, and vomiting. The history is provided by the patient.  Penile Discharge This is a new (single partner) problem. The current episode started yesterday. Episode frequency: occasionally. The problem has not changed since onset.Associated symptoms include abdominal pain. The symptoms are aggravated by nothing. The symptoms are relieved by nothing. He has tried nothing for the symptoms.  Abdominal Pain The primary symptoms of the illness include abdominal pain, nausea and vomiting. Episode onset: 2 weeks. The onset of the illness was gradual. The problem has been gradually worsening.  The abdominal pain is located in the RLQ and LLQ. The abdominal pain does not radiate. The severity of the abdominal pain is 7/10. The abdominal pain is relieved by nothing.  The nausea is associated with eating. The nausea is exacerbated by food.  The patient has not had a change in bowel habit. Additional symptoms associated with the illness include anorexia. Symptoms associated with the illness do not include chills, diaphoresis, constipation, hematuria or back pain.  Emesis  Associated symptoms include abdominal pain. Pertinent negatives include no chills.    Past Medical History  Diagnosis Date  . Hypertension   . High cholesterol     Past Surgical History  Procedure Date  . Hand surgery     No family history on file.  History  Substance Use  Topics  . Smoking status: Former Games developer  . Smokeless tobacco: Not on file  . Alcohol Use: 1.2 oz/week    2 Cans of beer per week     everyday      Review of Systems  Constitutional: Negative for chills and diaphoresis.  Gastrointestinal: Positive for nausea, vomiting, abdominal pain and anorexia. Negative for constipation.  Genitourinary: Positive for discharge. Negative for hematuria.  Musculoskeletal: Negative for back pain.  All other systems reviewed and are negative.    Physical Exam  BP 142/84  Pulse 98  Temp(Src) 98.2 F (36.8 C) (Oral)  Resp 18  Ht 6\' 2"  (1.88 m)  Wt 187 lb (84.823 kg)  BMI 24.01 kg/m2  SpO2 100%  Physical Exam  Nursing note and vitals reviewed. Constitutional: He is oriented to person, place, and time. He appears well-developed and well-nourished. No distress.  HENT:  Head: Normocephalic and atraumatic.  Eyes: EOM are normal.  Neck: Normal range of motion.  Cardiovascular: Normal rate.   Murmur heard. Pulmonary/Chest: Effort normal and breath sounds normal.  Abdominal: Soft. There is tenderness.       Lower abdomen tenderness to palpation. No rebound or guarding.  Genitourinary: Penis normal.       White discharge from penis. Culture obtained.  Neurological: He is alert and oriented to person, place, and time.  Skin: Skin is warm and dry.    ED Course  Procedures  MDM Patient with abdominal pain x 2 weeks and possible weight loss who presents with vomiting and abdominal pain. Labs with elevated LFTS c/w hepatitis. Additionally patient  has penile discharge (culture pending) for which he was treated. Has evidence of UTI, (culture pending), treatment begun. Abdominal series shows what looks like early bowel obstruction. With recent vomiting, differential includes ilius. CT pending.  MDM Reviewed: nursing note and vitals Interpretation: labs and x-ray         Nicoletta Dress. Colon Branch, MD 10/20/10 1610  Nicoletta Dress. Colon Branch, MD 10/20/10  [phone]

## 2010-10-20 NOTE — ED Provider Notes (Signed)
Physical Exam  BP 132/79  Pulse 94  Temp(Src) 98.9 F (37.2 C) (Oral)  Resp 18  Ht 6\' 2"  (1.88 m)  Wt 187 lb (84.823 kg)  BMI 24.01 kg/m2  SpO2 98%  Physical Exam  ED Course  Procedures  MDM Care received from Dr. Colon Branch.  Patient awaiting a CT scan to further evaluate the cause of his lower abdominal pain  9:19 AM Patient reports he feels much better this time.  CT scan without acute pathology except small hiatal hernia.  These labs were reviewed and is noted to have evidence of elevated LFTs and alkaline phosphatase.  CT scan shows no evidence of biliary pathology at this time.  Patient will followup with the gastroenterologist for elevation of his LFTs as well as his primary care physician.  His lower abdominal pain is likely rate related to a cystitis with evidence of a urinary tract infection with positive nitrites and 21-50 white blood cell and many bacteria.  Pt was given a dose of cipro IV in the ER. will be discharged home in the care of his primary care physician with PO cipro.  Home with pain medicine and antibiotics.  I doubt that this is pyelonephritis as a result of his urinary tract infection and does not believe that his nausea is associated it.   Results for orders placed during the hospital encounter of 10/20/10  URINALYSIS, ROUTINE W REFLEX MICROSCOPIC      Component Value Range   Color, Urine AMBER (*) YELLOW    Appearance CLOUDY (*) CLEAR    Specific Gravity, Urine 1.025  1.005 - 1.030    pH 5.5  5.0 - 8.0    Glucose, UA NEGATIVE  NEGATIVE (mg/dL)   Hgb urine dipstick SMALL (*) NEGATIVE    Bilirubin Urine NEGATIVE  NEGATIVE    Ketones, ur NEGATIVE  NEGATIVE (mg/dL)   Protein, ur TRACE (*) NEGATIVE (mg/dL)   Urobilinogen, UA 0.2  0.0 - 1.0 (mg/dL)   Nitrite POSITIVE (*) NEGATIVE    Leukocytes, UA SMALL (*) NEGATIVE   CBC      Component Value Range   WBC 11.4 (*) 4.0 - 10.5 (K/uL)   RBC 4.04 (*) 4.22 - 5.81 (MIL/uL)   Hemoglobin 12.7 (*) 13.0 - 17.0  (g/dL)   HCT 16.1 (*) 09.6 - 52.0 (%)   MCV 87.4  78.0 - 100.0 (fL)   MCH 31.4  26.0 - 34.0 (pg)   MCHC 36.0  30.0 - 36.0 (g/dL)   RDW 04.5  40.9 - 81.1 (%)   Platelets 155  150 - 400 (K/uL)  DIFFERENTIAL      Component Value Range   Neutrophils Relative 76  43 - 77 (%)   Neutro Abs 8.6 (*) 1.7 - 7.7 (K/uL)   Lymphocytes Relative 15  12 - 46 (%)   Lymphs Abs 1.7  0.7 - 4.0 (K/uL)   Monocytes Relative 9  3 - 12 (%)   Monocytes Absolute 1.0  0.1 - 1.0 (K/uL)   Eosinophils Relative 0  0 - 5 (%)   Eosinophils Absolute 0.0  0.0 - 0.7 (K/uL)   Basophils Relative 0  0 - 1 (%)   Basophils Absolute 0.0  0.0 - 0.1 (K/uL)  COMPREHENSIVE METABOLIC PANEL      Component Value Range   Sodium 134 (*) 135 - 145 (mEq/L)   Potassium 4.0  3.5 - 5.1 (mEq/L)   Chloride 98  96 - 112 (mEq/L)   CO2 21  19 -  32 (mEq/L)   Glucose, Bld 106 (*) 70 - 99 (mg/dL)   BUN 8  6 - 23 (mg/dL)   Creatinine, Ser 1.19  0.50 - 1.35 (mg/dL)   Calcium 8.7  8.4 - 14.7 (mg/dL)   Total Protein 7.5  6.0 - 8.3 (g/dL)   Albumin 2.6 (*) 3.5 - 5.2 (g/dL)   AST 829 (*) 0 - 37 (U/L)   ALT 170 (*) 0 - 53 (U/L)   Alkaline Phosphatase 190 (*) 39 - 117 (U/L)   Total Bilirubin 1.3 (*) 0.3 - 1.2 (mg/dL)   GFR calc non Af Amer >60  >60 (mL/min)   GFR calc Af Amer >60  >60 (mL/min)  URINE MICROSCOPIC-ADD ON      Component Value Range   Squamous Epithelial / LPF RARE  RARE    WBC, UA 21-50  <3 (WBC/hpf)   Bacteria, UA MANY (*) RARE          Lyanne Co, MD 10/20/10 [phone]

## 2010-10-21 LAB — GC/CHLAMYDIA PROBE AMP, GENITAL
Chlamydia, DNA Probe: NEGATIVE
GC Probe Amp, Genital: NEGATIVE

## 2010-10-22 LAB — URINE CULTURE
Colony Count: >=100000
Culture  Setup Time: 201208091638

## 2010-10-23 NOTE — ED Notes (Signed)
Results rcvd from [facility].  >/= 100,000 colonies -> E Coli.  Rx given in ED for Cipro -> sensitive to the same.  Chart appended per protocol.

## 2010-10-26 ENCOUNTER — Encounter: Payer: Self-pay | Admitting: Family Medicine

## 2010-10-26 ENCOUNTER — Ambulatory Visit (INDEPENDENT_AMBULATORY_CARE_PROVIDER_SITE_OTHER): Payer: Self-pay | Admitting: Family Medicine

## 2010-10-26 VITALS — BP 100/70 | HR 97 | Ht 74.0 in | Wt 182.1 lb

## 2010-10-26 DIAGNOSIS — R7401 Elevation of levels of liver transaminase levels: Secondary | ICD-10-CM

## 2010-10-26 DIAGNOSIS — E785 Hyperlipidemia, unspecified: Secondary | ICD-10-CM

## 2010-10-26 DIAGNOSIS — B353 Tinea pedis: Secondary | ICD-10-CM

## 2010-10-26 DIAGNOSIS — R748 Abnormal levels of other serum enzymes: Secondary | ICD-10-CM

## 2010-10-26 DIAGNOSIS — I10 Essential (primary) hypertension: Secondary | ICD-10-CM

## 2010-10-26 DIAGNOSIS — F1010 Alcohol abuse, uncomplicated: Secondary | ICD-10-CM

## 2010-10-26 DIAGNOSIS — K759 Inflammatory liver disease, unspecified: Secondary | ICD-10-CM

## 2010-10-26 MED ORDER — CLOTRIMAZOLE-BETAMETHASONE 1-0.05 % EX CREA: TOPICAL_CREAM | CUTANEOUS | Status: DC

## 2010-10-26 NOTE — Patient Instructions (Addendum)
F/U in 3 months.  Complete all medication for the UTI.  Your liver enzymes  Are very high, you ABSOLUTELY  Need to stop drinking.  Hepatitis panel asap    AST and   ALT in 6 weeks  Blood pressure is low, since you have lost weight, pls reduce your med dose to HALF tablet once daily(zestoretic)  PLS stop the pill for cholesterol  (mevacor, lovastatin)

## 2010-10-30 DIAGNOSIS — K759 Inflammatory liver disease, unspecified: Secondary | ICD-10-CM | POA: Insufficient documentation

## 2010-10-30 NOTE — Assessment & Plan Note (Signed)
Currently has elevation in liver enzymes, likely because of excessive alcohol, needs to quit

## 2010-10-30 NOTE — Progress Notes (Signed)
  Subjective:    Patient ID: Tristan Cook, male    DOB: 07-06-1960, 50 y.o.   MRN: [id number]  HPI Pt recently evaluated in the ed and dx with hepatitis, likely due to excessive alcohol use, which he curently states he has reduced/ stopped. He denies fever, jaundice, any known exposure to infectious hepatitis.    Review of Systems Denies recent fever or chills. Denies sinus pressure, nasal congestion, ear pain or sore throat. Denies chest congestion, productive cough or wheezing. Denies chest pains, palpitations and leg swelling Denies abdominal pain, nausea, vomiting,diarrhea or constipation.    Denies headaches, seizures, numbness, or tingling. Denies depression, anxiety or insomnia. Denies skin break down or rash.        Objective:   Physical Exam Patient alert and oriented and in no cardiopulmonary distress.  HEENT: No facial asymmetry, EOMI, no sinus tenderness,  oropharynx pink and moist.  Neck supple no adenopathy.  Chest: Clear to auscultation bilaterally.  CVS: S1, S2 no murmurs, no S3.  ABD: Soft non tender. Bowel sounds normal.  Ext: No edema  MS: Adequate ROM spine, shoulders, hips and knees.  Skin: Intact, no ulcerations or rash noted.  Psych: Good eye contact, normal affect. Memory intact not anxious or depressed appearing.  CNS: CN 2-12 intact, power, tone and sensation normal throughout.        Assessment & Plan:

## 2010-10-30 NOTE — Assessment & Plan Note (Signed)
Over corrected with weight loss, pt to reduce med by half

## 2010-10-30 NOTE — Assessment & Plan Note (Signed)
Pt to discontinue alcohol and rept labs in 6 weeks. Encouraged to get tested for infectious hepatitis asap

## 2010-10-30 NOTE — Assessment & Plan Note (Signed)
Pt to discontinue med at this time due to elevated liver enzymes

## 2011-01-03 ENCOUNTER — Encounter: Payer: Self-pay | Admitting: Family Medicine

## 2011-01-04 ENCOUNTER — Ambulatory Visit: Payer: Self-pay | Admitting: Family Medicine

## 2011-01-25 ENCOUNTER — Encounter: Payer: Self-pay | Admitting: Family Medicine

## 2011-01-26 ENCOUNTER — Encounter: Payer: Self-pay | Admitting: Family Medicine

## 2011-01-26 ENCOUNTER — Ambulatory Visit (INDEPENDENT_AMBULATORY_CARE_PROVIDER_SITE_OTHER): Payer: Self-pay | Admitting: Family Medicine

## 2011-01-26 VITALS — BP 120/78 | HR 88 | Resp 16 | Ht 74.0 in | Wt 201.4 lb

## 2011-01-26 DIAGNOSIS — K625 Hemorrhage of anus and rectum: Secondary | ICD-10-CM

## 2011-01-26 DIAGNOSIS — Z125 Encounter for screening for malignant neoplasm of prostate: Secondary | ICD-10-CM

## 2011-01-26 DIAGNOSIS — R7301 Impaired fasting glucose: Secondary | ICD-10-CM

## 2011-01-26 DIAGNOSIS — R5383 Other fatigue: Secondary | ICD-10-CM

## 2011-01-26 DIAGNOSIS — I10 Essential (primary) hypertension: Secondary | ICD-10-CM

## 2011-01-26 DIAGNOSIS — D179 Benign lipomatous neoplasm, unspecified: Secondary | ICD-10-CM | POA: Insufficient documentation

## 2011-01-26 DIAGNOSIS — E785 Hyperlipidemia, unspecified: Secondary | ICD-10-CM

## 2011-01-26 MED ORDER — LISINOPRIL-HYDROCHLOROTHIAZIDE 10-12.5 MG PO TABS: 1.0000 | ORAL_TABLET | Freq: Every day | ORAL | Status: DC

## 2011-01-26 NOTE — Progress Notes (Signed)
  Subjective:    Patient ID: BLAYNE FRANKIE, male    DOB: [birth year], 50 y.o.   MRN: [id number]  HPI C/o painless swelling on right chest noted in the past 5 months approximately, states this has been going on ever since he stopped cholesterol med .Hard stool , straining at stool in the past 6 months ever since he stopped drinking , and he has noted blood in the stool at times , no change in stool caliber, no family h/o colon ca Review of Systems See HPI Denies recent fever or chills. Denies sinus pressure, nasal congestion, ear pain or sore throat. Denies chest congestion, productive cough or wheezing. Denies chest pains, palpitations and leg swelling Denies dysuria, frequency, hesitancy or incontinence. Denies joint pain, swelling and limitation in mobility. Denies headaches, seizures, numbness, or tingling. Denies depression, anxiety or insomnia. Denies skin break down or rash.        Objective:   Physical Exam  Patient alert and oriented and in no cardiopulmonary distress.  HEENT: No facial asymmetry, EOMI, no sinus tenderness,  oropharynx pink and moist.  Neck supple no adenopathy.  Chest: Clear to auscultation bilaterally.Fatty tumor palpable under right chest wall, this is the area of pt's concern  CVS: S1, S2 no murmurs, no S3.  ABD: Soft non tender. Bowel sounds normal.No palpable organomegaly or masses. Rectal exam not done  Ext: No edema  MS: Adequate ROM spine, shoulders, hips and knees.  Skin: Intact, no ulcerations or rash noted.  Psych: Good eye contact, normal affect. Memory intact not anxious or depressed appearing.  CNS: CN 2-12 intact, power, tone and sensation normal throughout.       Assessment & Plan:

## 2011-01-26 NOTE — Patient Instructions (Addendum)
CPE in 2.5 months.  You are to get fasting labs tomorrow.Cbc and anemia panel, lipid, hepatic and chem 7, testosterone level You are referred for an US of the right chest wall swelling.  You are referred to GI for your c/o rectal bleeding intermittently.  Pls commit to daily stool softeners, and increase intake of water , vegetable and fruit to help with bowel movements  PSA and TSH in January, non fasting  You need TdaP and flu vaccines, you should have them today.Please re consider

## 2011-01-26 NOTE — Assessment & Plan Note (Signed)
Controlled, no change in medication  

## 2011-01-29 NOTE — Assessment & Plan Note (Signed)
C/o increased constipation and rctal bleeding intermittently x 6 months, refer for GI eval

## 2011-01-29 NOTE — Assessment & Plan Note (Signed)
Hyperlipidemia:Low fat diet discussed and encouraged.  Labs past due 

## 2011-01-29 NOTE — Assessment & Plan Note (Signed)
Lipoma on chest wall, pt to have [place] to further evaluate this

## 2011-02-06 ENCOUNTER — Encounter: Payer: Self-pay | Admitting: Gastroenterology

## 2011-02-07 ENCOUNTER — Ambulatory Visit (INDEPENDENT_AMBULATORY_CARE_PROVIDER_SITE_OTHER): Payer: Self-pay | Admitting: Urgent Care

## 2011-02-07 ENCOUNTER — Encounter: Payer: Self-pay | Admitting: Urgent Care

## 2011-02-07 DIAGNOSIS — K625 Hemorrhage of anus and rectum: Secondary | ICD-10-CM

## 2011-02-07 DIAGNOSIS — K5900 Constipation, unspecified: Secondary | ICD-10-CM

## 2011-02-07 NOTE — Progress Notes (Addendum)
Referring Provider: Simpson, Margaret, MD Primary Care Physician:  Margaret Simpson, MD, MD Primary Gastroenterologist:  Dr. Fields  Chief Complaint  Patient presents with  . Colonoscopy    Rectal bleeding    HPI:  Tristan Cook is a 50 y.o. male here as a referral from Dr. Simpson for rectal bleeding. He had history of constipation x 3 mo.  Tried OTC stool softeners & they seem to help.  He has noticed bright red blood in stool in small to large amts.  No clots.  Occasional ibuprofen rarely.   Denies any upper GI symptoms including heartburn, indigestion, nausea, vomiting, dysphagia, odynophagia or anorexia.  Denies any diarrhea or weight loss.  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  Past Medical History  Diagnosis Date  . Hypertension   . High cholesterol   . Substance abuse     h/o excessive alcohol use; pt says he limits use to one to 2 beers daily he currently  . Neck pain   . Back pain   . Abdominal pain   . Hyperlipidemia   . Adenomatous colon polyp 2008    Past Surgical History  Procedure Date  . Hand surgery   . Colonoscopy 10/09/2006    3 mm pedunculated sigmoid colon polyp removed/8-mm sessile hepatic flexure polyp (tubular villous adenoma) removed/6-mm  descending colon polyp removed/small internal hemorrhoids    Current Outpatient Prescriptions  Medication Sig Dispense Refill  . clotrimazole-betamethasone (LOTRISONE) cream Apply to affected area 2 times daily  45 g  1  . lisinopril-hydrochlorothiazide (ZESTORETIC) 10-12.5 MG per tablet Take 1 tablet by mouth daily.  90 tablet  1  . lovastatin (MEVACOR) 20 MG tablet Take 1 tablet (20 mg total) by mouth daily.  30 tablet  3    Allergies as of 02/07/2011  . (No Known Allergies)   Family History  Problem Relation Age of Onset  . Hypertension Mother   . Hypertension Father   . Hypertension Sister   . Hypertension Brother   . Colon cancer Brother 50   History   Social History  . Marital  Status: Legally Separated    Spouse Name: N/A    Number of Children: 0  . Years of Education: N/A   Occupational History  . unemployed    Social History Main Topics  . Smoking status: Former Smoker -- 1.0 packs/day for 23 years    Types: Cigarettes    Quit date: 03/13/2004  . Smokeless tobacco: Not on file  . Alcohol Use: 1.2 oz/week    2 Cans of beer per week     can of beer once or twice per wk  . Drug Use: No  . Sexually Active: Yes  Review of Systems: Gen: Denies any fever, chills, sweats, anorexia, fatigue, weakness, malaise, weight loss, and sleep disorder CV: Denies chest pain, angina, palpitations, syncope, orthopnea, PND, peripheral edema, and claudication. Resp: Denies dyspnea at rest, dyspnea with exercise, cough, sputum, wheezing, coughing up blood, and pleurisy. GI: Denies vomiting blood, jaundice, and fecal incontinence.   Denies dysphagia or odynophagia. GU : Denies urinary burning, blood in urine, urinary frequency, urinary hesitancy, nocturnal urination, and urinary incontinence. MS: Denies joint pain, limitation of movement, and swelling, stiffness, low back pain, extremity pain. Denies muscle weakness, cramps, atrophy.  Derm: Denies rash, itching, dry skin, hives, moles, warts, or unhealing ulcers.  Psych: Denies depression, anxiety, memory loss, suicidal ideation, hallucinations, paranoia, and confusion. Heme: Denies bruising, bleeding, and enlarged lymph nodes.  Physical   Exam: BP 118/78  Pulse 90  Temp(Src) 98.1 F (36.7 C) (Temporal)  Ht 6' 2" (1.88 m)  Wt 202 lb 3.2 oz (91.717 kg)  BMI 25.96 kg/m2 General:   Alert,  Well-developed, well-nourished, pleasant and cooperative in NAD Head:  Normocephalic and atraumatic. Eyes:  Sclera clear, no icterus.   Conjunctiva pink. Ears:  Normal auditory acuity. Nose:  No deformity, discharge,  or lesions. Mouth:  No deformity or lesions, oropharynx pink & moist. Neck:  Supple; no masses or thyromegaly. Lungs:   Clear throughout to auscultation.   No wheezes, crackles, or rhonchi. No acute distress. Heart:  Regular rate and rhythm; no murmurs, clicks, rubs,  or gallops. Abdomen:  Soft, nontender and nondistended. No masses, hepatosplenomegaly or hernias noted. Normal bowel sounds, without guarding, and without rebound.   Rectal:  Deferred until time of colonoscopy.   Msk:  Symmetrical without gross deformities. Normal posture. Pulses:  Normal pulses noted. Extremities:  Without clubbing or edema. Neurologic:  Alert and  oriented x4;  grossly normal neurologically. Skin:  Intact without significant lesions or rashes. Cervical Nodes:  No significant cervical adenopathy. Psych:  Alert and cooperative. Normal mood and affect.  

## 2011-02-07 NOTE — Progress Notes (Signed)
Cc to PCP 

## 2011-02-07 NOTE — Assessment & Plan Note (Signed)
Use colace 100mg  up to twice per day for constipation Use miralax 17 grams daily if needed for constipation If severe bleeding, go directly to ER

## 2011-02-07 NOTE — Patient Instructions (Signed)
Keep colonoscopy as planned Use colace 100mg  up to twice per day for constipation Use miralax 17 grams daily if needed for constipation If severe bleeding, go directly to ER

## 2011-02-07 NOTE — Assessment & Plan Note (Signed)
Tristan Cook is a 50 y.o. male with 3 month history of constipation and rectal bleeding. He also has history of adenomatous colon polyp. He is want any colonoscopy for further evaluation to determine an etiology of his rectal bleeding.I have discussed risks & benefits which include, but are not limited to, bleeding, infection, perforation & drug reaction.  The patient agrees with this plan & written consent will be obtained.

## 2011-02-08 NOTE — Progress Notes (Signed)
PT HAD TCS IN 2008 SHOWING HEMORRHOIDS. RECTAL BLEEDING LIKELY 2O TO HEMORRHOIDS. DAILY ETOH USE.  RECHECK CBC/PT/INR PRIOR TO TCS.

## 2011-02-09 ENCOUNTER — Other Ambulatory Visit: Payer: Self-pay

## 2011-02-09 DIAGNOSIS — K625 Hemorrhage of anus and rectum: Secondary | ICD-10-CM

## 2011-02-09 NOTE — Progress Notes (Signed)
Please call pt to arrange labs per SL[facility] below.  Thanks

## 2011-02-09 NOTE — Progress Notes (Signed)
LMOM for pt to call. Lab orders faxed to [facility].

## 2011-02-13 NOTE — Progress Notes (Signed)
Pt aware he can go get labs. ( See documentaion of 02/13/2011).

## 2011-02-13 NOTE — Progress Notes (Signed)
Per my conversation with Lubertha Basque on 02/10/2011, pt has 100% coverage from 01/06/11 - 07/07/2011.

## 2011-02-20 ENCOUNTER — Encounter (HOSPITAL_COMMUNITY): Payer: Self-pay | Admitting: Pharmacy Technician

## 2011-02-23 ENCOUNTER — Telehealth: Payer: Self-pay | Admitting: Family Medicine

## 2011-02-23 ENCOUNTER — Other Ambulatory Visit: Payer: Self-pay | Admitting: Gastroenterology

## 2011-02-23 MED ORDER — LISINOPRIL-HYDROCHLOROTHIAZIDE 10-12.5 MG PO TABS: 1.0000 | ORAL_TABLET | Freq: Every day | ORAL | Status: DC

## 2011-02-23 NOTE — Telephone Encounter (Signed)
SENT IN

## 2011-02-24 LAB — CBC WITH DIFFERENTIAL/PLATELET
Eosinophils Absolute: 0.2 10*3/uL (ref 0.0–0.7)
HCT: 44.1 % (ref 39.0–52.0)
Hemoglobin: 15.2 g/dL (ref 13.0–17.0)
Lymphs Abs: 2.3 10*3/uL (ref 0.7–4.0)
MCH: 30 pg (ref 26.0–34.0)
MCV: 87 fL (ref 78.0–100.0)
Monocytes Absolute: 0.5 10*3/uL (ref 0.1–1.0)
Monocytes Relative: 9 % (ref 3–12)
Neutrophils Relative %: 46 % (ref 43–77)
RBC: 5.07 MIL/uL (ref 4.22–5.81)

## 2011-02-24 LAB — PROTIME-INR: Prothrombin Time: 13.6 seconds (ref 11.6–15.2)

## 2011-02-26 NOTE — Progress Notes (Signed)
Quick Note:  See PT/INR-normal CC: Tristan Overman, MD, MD  ______

## 2011-02-26 NOTE — Progress Notes (Signed)
Quick Note:  Please call pt & let him know his blood ct is normal. CC: Syliva Overman, MD  ______

## 2011-02-27 ENCOUNTER — Telehealth: Payer: Self-pay

## 2011-02-27 MED ORDER — SODIUM CHLORIDE 0.45 % IV SOLN
Freq: Once | INTRAVENOUS | Status: AC
  Administered 2011-02-28: 09:00:00 via INTRAVENOUS

## 2011-02-27 NOTE — Telephone Encounter (Signed)
Called pt and informed of normal lab results.

## 2011-02-27 NOTE — Progress Notes (Signed)
Quick Note:  Pt informed ______ 

## 2011-02-28 ENCOUNTER — Encounter (HOSPITAL_COMMUNITY): Payer: Self-pay | Admitting: *Deleted

## 2011-02-28 ENCOUNTER — Encounter (HOSPITAL_COMMUNITY)
Admission: RE | Disposition: A | Discharge status: Home / Self Care | Payer: Self-pay | Source: Ambulatory Visit | Attending: Gastroenterology

## 2011-02-28 ENCOUNTER — Ambulatory Visit (HOSPITAL_COMMUNITY)
Admission: RE | Admit: 2011-02-28 | Discharge: 2011-02-28 | Disposition: A | Discharge status: Home / Self Care | Payer: Self-pay | Source: Ambulatory Visit | Attending: Gastroenterology | Admitting: Gastroenterology

## 2011-02-28 ENCOUNTER — Other Ambulatory Visit: Payer: Self-pay | Admitting: Gastroenterology

## 2011-02-28 DIAGNOSIS — Z860100 Personal history of colon polyps, unspecified: Secondary | ICD-10-CM | POA: Insufficient documentation

## 2011-02-28 DIAGNOSIS — D128 Benign neoplasm of rectum: Secondary | ICD-10-CM | POA: Insufficient documentation

## 2011-02-28 DIAGNOSIS — K625 Hemorrhage of anus and rectum: Secondary | ICD-10-CM

## 2011-02-28 DIAGNOSIS — E785 Hyperlipidemia, unspecified: Secondary | ICD-10-CM | POA: Insufficient documentation

## 2011-02-28 DIAGNOSIS — Z79899 Other long term (current) drug therapy: Secondary | ICD-10-CM | POA: Insufficient documentation

## 2011-02-28 DIAGNOSIS — D129 Benign neoplasm of anus and anal canal: Secondary | ICD-10-CM | POA: Insufficient documentation

## 2011-02-28 DIAGNOSIS — I10 Essential (primary) hypertension: Secondary | ICD-10-CM | POA: Insufficient documentation

## 2011-02-28 DIAGNOSIS — K621 Rectal polyp: Secondary | ICD-10-CM

## 2011-02-28 DIAGNOSIS — K921 Melena: Secondary | ICD-10-CM | POA: Insufficient documentation

## 2011-02-28 DIAGNOSIS — E7800 Pure hypercholesterolemia, unspecified: Secondary | ICD-10-CM | POA: Insufficient documentation

## 2011-02-28 DIAGNOSIS — K5900 Constipation, unspecified: Secondary | ICD-10-CM

## 2011-02-28 DIAGNOSIS — K648 Other hemorrhoids: Secondary | ICD-10-CM | POA: Insufficient documentation

## 2011-02-28 DIAGNOSIS — K620 Anal polyp: Secondary | ICD-10-CM

## 2011-02-28 HISTORY — PX: COLONOSCOPY: SHX5424

## 2011-02-28 SURGERY — COLONOSCOPY
Anesthesia: Moderate Sedation

## 2011-02-28 MED ORDER — MIDAZOLAM HCL 5 MG/5ML IJ SOLN
INTRAMUSCULAR | Status: DC | PRN
  Administered 2011-02-28 (×2): 2 mg via INTRAVENOUS

## 2011-02-28 MED ORDER — MEPERIDINE HCL 100 MG/ML IJ SOLN
INTRAMUSCULAR | Status: DC | PRN
  Administered 2011-02-28 (×2): 25 mg via INTRAVENOUS

## 2011-02-28 MED ORDER — MEPERIDINE HCL 100 MG/ML IJ SOLN
INTRAMUSCULAR | Status: AC
  Filled 2011-02-28: qty 2

## 2011-02-28 MED ORDER — MIDAZOLAM HCL 5 MG/5ML IJ SOLN
INTRAMUSCULAR | Status: AC
  Filled 2011-02-28: qty 10

## 2011-02-28 NOTE — H&P (View-Only) (Signed)
Referring Provider: Syliva Overman, MD Primary Care Physician:  Syliva Overman, MD, MD Primary Gastroenterologist:  Dr. Darrick Penna  Chief Complaint  Patient presents with  . Colonoscopy    Rectal bleeding    HPI:  Tristan Cook is a 50 y.o. male here as a referral from Dr. Lodema Hong for rectal bleeding. He had history of constipation x 3 mo.  Tried OTC stool softeners & they seem to help.  He has noticed bright red blood in stool in small to large amts.  No clots.  Occasional ibuprofen rarely.   Denies any upper GI symptoms including heartburn, indigestion, nausea, vomiting, dysphagia, odynophagia or anorexia.  Denies any diarrhea or weight loss.  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  Past Medical History  Diagnosis Date  . Hypertension   . High cholesterol   . Substance abuse     h/o excessive alcohol use; pt says he limits use to one to 2 beers daily he currently  . Neck pain   . Back pain   . Abdominal pain   . Hyperlipidemia   . Adenomatous colon polyp 2008    Past Surgical History  Procedure Date  . Hand surgery   . Colonoscopy 10/09/2006    3 mm pedunculated sigmoid colon polyp removed/8-mm sessile hepatic flexure polyp (tubular villous adenoma) removed/6-mm  descending colon polyp removed/small internal hemorrhoids    Current Outpatient Prescriptions  Medication Sig Dispense Refill  . clotrimazole-betamethasone (LOTRISONE) cream Apply to affected area 2 times daily  45 g  1  . lisinopril-hydrochlorothiazide (ZESTORETIC) 10-12.5 MG per tablet Take 1 tablet by mouth daily.  90 tablet  1  . lovastatin (MEVACOR) 20 MG tablet Take 1 tablet (20 mg total) by mouth daily.  30 tablet  3    Allergies as of 02/07/2011  . (No Known Allergies)   Family History  Problem Relation Age of Onset  . Hypertension Mother   . Hypertension Father   . Hypertension Sister   . Hypertension Brother   . Colon cancer Brother 41   History   Social History  . Marital  Status: Legally Separated    Spouse Name: N/A    Number of Children: 0  . Years of Education: N/A   Occupational History  . unemployed    Social History Main Topics  . Smoking status: Former Smoker -- 1.0 packs/day for 23 years    Types: Cigarettes    Quit date: 03/13/2004  . Smokeless tobacco: Not on file  . Alcohol Use: 1.2 oz/week    2 Cans of beer per week     can of beer once or twice per wk  . Drug Use: No  . Sexually Active: Yes  Review of Systems: Gen: Denies any fever, chills, sweats, anorexia, fatigue, weakness, malaise, weight loss, and sleep disorder CV: Denies chest pain, angina, palpitations, syncope, orthopnea, PND, peripheral edema, and claudication. Resp: Denies dyspnea at rest, dyspnea with exercise, cough, sputum, wheezing, coughing up blood, and pleurisy. GI: Denies vomiting blood, jaundice, and fecal incontinence.   Denies dysphagia or odynophagia. GU : Denies urinary burning, blood in urine, urinary frequency, urinary hesitancy, nocturnal urination, and urinary incontinence. MS: Denies joint pain, limitation of movement, and swelling, stiffness, low back pain, extremity pain. Denies muscle weakness, cramps, atrophy.  Derm: Denies rash, itching, dry skin, hives, moles, warts, or unhealing ulcers.  Psych: Denies depression, anxiety, memory loss, suicidal ideation, hallucinations, paranoia, and confusion. Heme: Denies bruising, bleeding, and enlarged lymph nodes.  Physical  Exam: BP 118/78  Pulse 90  Temp(Src) 98.1 F (36.7 C) (Temporal)  Ht 6\' 2"  (1.88 m)  Wt 202 lb 3.2 oz (91.717 kg)  BMI 25.96 kg/m2 General:   Alert,  Well-developed, well-nourished, pleasant and cooperative in NAD Head:  Normocephalic and atraumatic. Eyes:  Sclera clear, no icterus.   Conjunctiva pink. Ears:  Normal auditory acuity. Nose:  No deformity, discharge,  or lesions. Mouth:  No deformity or lesions, oropharynx pink & moist. Neck:  Supple; no masses or thyromegaly. Lungs:   Clear throughout to auscultation.   No wheezes, crackles, or rhonchi. No acute distress. Heart:  Regular rate and rhythm; no murmurs, clicks, rubs,  or gallops. Abdomen:  Soft, nontender and nondistended. No masses, hepatosplenomegaly or hernias noted. Normal bowel sounds, without guarding, and without rebound.   Rectal:  Deferred until time of colonoscopy.   Msk:  Symmetrical without gross deformities. Normal posture. Pulses:  Normal pulses noted. Extremities:  Without clubbing or edema. Neurologic:  Alert and  oriented x4;  grossly normal neurologically. Skin:  Intact without significant lesions or rashes. Cervical Nodes:  No significant cervical adenopathy. Psych:  Alert and cooperative. Normal mood and affect.

## 2011-02-28 NOTE — Op Note (Signed)
[facility] [address] [place], [place]  [address]  COLONOSCOPY PROCEDURE REPORT  PATIENT:  Tristan Cook, Tristan Cook  MR#:  [id number] BIRTHDATE:  February 02, 1961, 50 yrs. old  GENDER:  male  ENDOSCOPIST:  Jonette Eva, MD REF. BY:  Syliva Overman, M.D. ASSISTANT:  PROCEDURE DATE:  02/28/2011 PROCEDURE:  Colonoscopy with biopsy  INDICATIONS:  PERSONAL Hx ADVANCED POLYP JUL 2008 RECTAL BLEEDING ASSOCIATED WITH CONSTIPATION  MEDICATIONS:   Demerol 50 mg IV, Versed 4 mg IV  DESCRIPTION OF PROCEDURE:    Physical exam was performed. Informed consent was obtained from the patient after explaining the benefits, risks, and alternatives to procedure.  The patient was connected to monitor and placed in left lateral position. Continuous oxygen was provided by nasal cannula and IV medicine administered through an indwelling cannula.  After administration of sedation and rectal exam, the patient's rectum was intubated and the EC-[id number] ([id number]) colonoscope was advanced under direct visualization to the cecum.  The scope was removed slowly by carefully examining the color, texture, anatomy, and integrity mucosa on the way out.  The patient was recovered in endoscopy and discharged home in satisfactory condition. <<PROCEDUREIMAGES>>  FINDINGS:  There were TWO 3-4 MM SESSILE polyps identified and removed in the rectum VIA COLD FORCEPS.  Internal Hemorrhoids were found.  PREP QUALITY: GOOD CECAL W/D TIME:    20 minutes  COMPLICATIONS:    None  ENDOSCOPIC IMPRESSION: 1) Polyps, multiple in the rectum 2) Internal hemorrhoids, MODERATE-CAUSING RECTAL BLEEDING  RECOMMENDATIONS: TCS IN 5 YEARS DUE TO [id number] POLYP HIGH FIBER DIET AVOID CONSTIPATION. DRINK 6-8 CUPS OF WATER DAILY OPV IN 4 MOS. CONSIDER ADDING AMITIZA IF sX NO RESOLVED.  REPEAT EXAM:  No  ______________________________ Jonette Eva, MD  CC:  Syliva Overman, M.D.  n. eSIGNEDDuncan Dull Merritt Mccravy at 02/28/2011  09:47 AM  Kathline Magic, [phone]

## 2011-02-28 NOTE — Interval H&P Note (Signed)
History and Physical Interval Note:  02/28/2011 8:56 AM  Tristan Cook  has presented today for surgery, with the diagnosis of rectal bleeding  & constipation  The various methods of treatment have been discussed with the patient and family. After consideration of risks, benefits and other options for treatment, the patient has consented to  Procedure(s): COLONOSCOPY as a surgical intervention .  The patients' history has been reviewed, patient examined, no change in status, stable for surgery.  I have reviewed the patients' chart and labs.  Questions were answered to the patient's satisfaction.     [employer]

## 2011-03-13 ENCOUNTER — Encounter (HOSPITAL_COMMUNITY): Payer: Self-pay | Admitting: Gastroenterology

## 2011-03-13 ENCOUNTER — Telehealth: Payer: Self-pay | Admitting: Gastroenterology

## 2011-03-13 NOTE — Telephone Encounter (Signed)
Please call pt. HE HAD A PROLAPSED POLYP removed from hIS colon. TCS in 5 years. High fiber diet.

## 2011-03-15 ENCOUNTER — Telehealth: Payer: Self-pay

## 2011-03-15 ENCOUNTER — Encounter: Payer: Self-pay | Admitting: Gastroenterology

## 2011-03-15 NOTE — Telephone Encounter (Signed)
Referral faxed to Dr Ula Lingo

## 2011-03-15 NOTE — Telephone Encounter (Signed)
Pt said he is having a lot of trouble with hemorrhoids. He said as soon as he gets out of shower he is having rectal itching, and very irritated. Please advise!

## 2011-03-15 NOTE — Telephone Encounter (Signed)
CALLED PT WITH [facility] W/ DR Lovell Sheehan ON 01/08- PT WILL NOT BE ABLE TO GO AT THIS TIME DUE TO FINANCIAL REASONS- DR Lovell Sheehan IS REQUESTING $100 AT TIME OF VISIT

## 2011-03-15 NOTE — Telephone Encounter (Signed)
REVIEWED.  

## 2011-03-15 NOTE — Telephone Encounter (Signed)
LM for pt to returncall

## 2011-03-15 NOTE — Telephone Encounter (Signed)
Informed pt. Rx's called to [facility].

## 2011-03-15 NOTE — Telephone Encounter (Signed)
Reminder in epic to have tcs in 5 yrs °

## 2011-03-15 NOTE — Telephone Encounter (Signed)
Pt informed

## 2011-03-15 NOTE — Telephone Encounter (Signed)
Results Cc to PCP  

## 2011-03-15 NOTE — Telephone Encounter (Signed)
Pt is aware of OV on 04/26/11 at 0930 with SF and appt card was mailed

## 2011-03-15 NOTE — Telephone Encounter (Signed)
PLEASE CALL PT. HE WILL NEED TO AVOID CONSTIPATION. I will prescribe AMITIZA 24 MCG HE SHOULD TAKE 1 PO QHS FOR 7 DAYS THEN 1  PO BID #[id number]. MED MAY CAUSE NAUSEA. HE should have Anusol HC 1 pr bid for 10 days #qs, rfx0. He needs to be referred to Dr. Leticia Penna or Lovell Sheehan for hemorrhoidectomy. OPV IN 6 WEEKS FOR FOLLOW UP.

## 2011-04-05 ENCOUNTER — Encounter: Payer: Self-pay | Admitting: Family Medicine

## 2011-04-07 ENCOUNTER — Ambulatory Visit: Payer: Self-pay | Admitting: Family Medicine

## 2011-04-26 ENCOUNTER — Ambulatory Visit (INDEPENDENT_AMBULATORY_CARE_PROVIDER_SITE_OTHER): Payer: Self-pay | Admitting: Gastroenterology

## 2011-04-26 ENCOUNTER — Encounter: Payer: Self-pay | Admitting: Gastroenterology

## 2011-04-26 VITALS — BP 134/82 | HR 102 | Temp 97.4°F | Ht 74.0 in | Wt 204.8 lb

## 2011-04-26 DIAGNOSIS — K625 Hemorrhage of anus and rectum: Secondary | ICD-10-CM

## 2011-04-26 MED ORDER — HYDROCORTISONE ACETATE 25 MG RE SUPP: 25.0000 mg | Freq: Two times a day (BID) | RECTAL | Status: DC

## 2011-04-26 NOTE — Patient Instructions (Signed)
USE PREP H AS NEEDED. USE  ANUSOL HC AS NEEDED. FOLLOW UP IN 6 MOS. Hemorrhoids Hemorrhoids are dilated (enlarged) veins around the rectum. Sometimes clots will form in the veins. This makes them swollen and painful. These are called thrombosed hemorrhoids. Causes of hemorrhoids include:  Constipation.   Straining to have a bowel movement.   HEAVY LIFTING HOME CARE INSTRUCTIONS  Eat a well balanced diet and drink 6 to 8 glasses of water every day to avoid constipation. You may also use a bulk laxative.   Avoid straining to have bowel movements.   Keep anal area dry and clean.   Do not use a donut shaped pillow or sit on the toilet for long periods. This increases blood pooling and pain.   Move your bowels when your body has the urge; this will require less straining and will decrease pain and pressure.

## 2011-04-26 NOTE — Progress Notes (Signed)
Faxed to PCP

## 2011-04-26 NOTE — Assessment & Plan Note (Addendum)
DUE TO HEMORRHOIDS. Sx controlled WITH FIBER, WATER, & PRN PREP H.  PREP H PRN. ANUSOL HC SUPP PRN. OPV IN 6 MOS.

## 2011-04-26 NOTE — Progress Notes (Signed)
  Subjective:    Patient ID: Tristan Cook, male    DOB: [birth year], 51 y.o.   MRN: [id number]  PCP: Tristan Cook  HPI Constipation better. No bleeding. Walks around it feel "sweaty" down there. Been using Prep H on the inside and it has hleped. EATING FIBER & DRINKING WATER. Did not pick up supp or Amitiza. Amitiza too expensive.   ENROLLED AT [facility]. 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  Past Medical History  Diagnosis Date  . Hypertension   . High cholesterol   . Substance abuse     h/o excessive alcohol use; pt says he limits use to one to 2 beers daily he currently  . Neck pain   . Back pain   . Abdominal pain   . Hyperlipidemia   . Adenomatous colon polyp 2008  . Constipation     Past Surgical History  Procedure Date  . Hand surgery   . Colonoscopy 10/09/2006    3 mm pedunculated sigmoid colon polyp removed/8-mm sessile hepatic flexure polyp (tubular villous adenoma) removed/6-mm  descending colon polyp removed/small internal hemorrhoids  . Colonoscopy 02/28/2011    Procedure: COLONOSCOPY;  Surgeon: Arlyce Harman, MD;  Location: [facility];  Service: Endoscopy;  Laterality: N/A;  9:30    No Known Allergies  Current Outpatient Prescriptions  Medication Sig Dispense Refill  . lisinopril-hydrochlorothiazide (ZESTORETIC) 10-12.5 MG per tablet Take 1 tablet by mouth daily.    .      . lovastatin (MEVACOR) 20 MG tablet Take 1 tablet (20 mg total) by mouth daily.        Review of Systems     Objective:   Physical Exam  Vitals reviewed. Constitutional: He is oriented to person, place, and time. He appears well-developed and well-nourished. No distress.  HENT:  Head: Normocephalic and atraumatic.  Cardiovascular: Normal rate, regular rhythm and normal heart sounds.   Pulmonary/Chest: Effort normal and breath sounds normal. No respiratory distress.  Abdominal: Soft. Bowel sounds are normal. He exhibits no distension. There is no tenderness.    Musculoskeletal: He exhibits no edema.  Neurological: He is alert and oriented to person, place, and time.  Psychiatric: He has a normal mood and affect.          Assessment & Plan:

## 2011-05-01 NOTE — Progress Notes (Signed)
Reminder in epic to follow up in 6 months °

## 2011-06-19 ENCOUNTER — Encounter (HOSPITAL_COMMUNITY): Payer: Self-pay | Admitting: Emergency Medicine

## 2011-06-19 ENCOUNTER — Emergency Department (HOSPITAL_COMMUNITY): Payer: Self-pay

## 2011-06-19 ENCOUNTER — Emergency Department (HOSPITAL_COMMUNITY)
Admission: EM | Admit: 2011-06-19 | Discharge: 2011-06-19 | Disposition: A | Discharge status: Home / Self Care | Payer: Self-pay | Attending: Emergency Medicine | Admitting: Emergency Medicine

## 2011-06-19 DIAGNOSIS — M25579 Pain in unspecified ankle and joints of unspecified foot: Secondary | ICD-10-CM | POA: Insufficient documentation

## 2011-06-19 DIAGNOSIS — I10 Essential (primary) hypertension: Secondary | ICD-10-CM | POA: Insufficient documentation

## 2011-06-19 DIAGNOSIS — Z79899 Other long term (current) drug therapy: Secondary | ICD-10-CM | POA: Insufficient documentation

## 2011-06-19 DIAGNOSIS — M109 Gout, unspecified: Secondary | ICD-10-CM | POA: Insufficient documentation

## 2011-06-19 DIAGNOSIS — Z9889 Other specified postprocedural states: Secondary | ICD-10-CM | POA: Insufficient documentation

## 2011-06-19 DIAGNOSIS — E789 Disorder of lipoprotein metabolism, unspecified: Secondary | ICD-10-CM | POA: Insufficient documentation

## 2011-06-19 DIAGNOSIS — E785 Hyperlipidemia, unspecified: Secondary | ICD-10-CM | POA: Insufficient documentation

## 2011-06-19 MED ORDER — PREDNISONE 10 MG PO TABS: ORAL_TABLET | ORAL | Status: DC

## 2011-06-19 MED ORDER — HYDROCODONE-ACETAMINOPHEN 5-325 MG PO TABS: ORAL_TABLET | ORAL | Status: AC

## 2011-06-19 MED ORDER — INDOMETHACIN 25 MG PO CAPS
50.0000 mg | ORAL_CAPSULE | Freq: Once | ORAL | Status: AC
  Administered 2011-06-19: 50 mg via ORAL
  Filled 2011-06-19: qty 2

## 2011-06-19 NOTE — ED Notes (Signed)
Right ankle pain x 5 days. Denies trauma.

## 2011-06-19 NOTE — Discharge Instructions (Signed)
Gout Gout is an inflammatory condition (arthritis) caused by a buildup of uric acid crystals in the joints. Uric acid is a chemical that is normally present in the blood. Under some circumstances, uric acid can form into crystals in your joints. This causes joint redness, soreness, and swelling (inflammation). Repeat attacks are common. Over time, uric acid crystals can form into masses (tophi) near a joint, causing disfigurement. Gout is treatable and often preventable. CAUSES  The disease begins with elevated levels of uric acid in the blood. Uric acid is produced by your body when it breaks down a naturally found substance called purines. This also happens when you eat certain foods such as meats and fish. Causes of an elevated uric acid level include:  Being passed down from parent to child (heredity).   Diseases that cause increased uric acid production (obesity, psoriasis, some cancers).   Excessive alcohol use.   Diet, especially diets rich in meat and seafood.   Medicines, including certain cancer-fighting drugs (chemotherapy), diuretics, and aspirin.   Chronic kidney disease. The kidneys are no longer able to remove uric acid well.   Problems with metabolism.  Conditions strongly associated with gout include:  Obesity.   High blood pressure.   High cholesterol.   Diabetes.  Not everyone with elevated uric acid levels gets gout. It is not understood why some people get gout and others do not. Surgery, joint injury, and eating too much of certain foods are some of the factors that can lead to gout. SYMPTOMS   An attack of gout comes on quickly. It causes intense pain with redness, swelling, and warmth in a joint.   Fever can occur.   Often, only one joint is involved. Certain joints are more commonly involved:   Base of the big toe.   Knee.   Ankle.   Wrist.   Finger.  Without treatment, an attack usually goes away in a few days to weeks. Between attacks, you  usually will not have symptoms, which is different from many other forms of arthritis. DIAGNOSIS  Your caregiver will suspect gout based on your symptoms and exam. Removal of fluid from the joint (arthrocentesis) is done to check for uric acid crystals. Your caregiver will give you a medicine that numbs the area (local anesthetic) and use a needle to remove joint fluid for exam. Gout is confirmed when uric acid crystals are seen in joint fluid, using a special microscope. Sometimes, blood, urine, and X-ray tests are also used. TREATMENT  There are 2 phases to gout treatment: treating the sudden onset (acute) attack and preventing attacks (prophylaxis). Treatment of an Acute Attack  Medicines are used. These include anti-inflammatory medicines or steroid medicines.   An injection of steroid medicine into the affected joint is sometimes necessary.   The painful joint is rested. Movement can worsen the arthritis.   You may use warm or cold treatments on painful joints, depending which works best for you.   Discuss the use of coffee, vitamin C, or cherries with your caregiver. These may be helpful treatment options.  Treatment to Prevent Attacks After the acute attack subsides, your caregiver may advise prophylactic medicine. These medicines either help your kidneys eliminate uric acid from your body or decrease your uric acid production. You may need to stay on these medicines for a very long time. The early phase of treatment with prophylactic medicine can be associated with an increase in acute gout attacks. For this reason, during the first few months   of treatment, your caregiver may also advise you to take medicines usually used for acute gout treatment. Be sure you understand your caregiver's directions. You should also discuss dietary treatment with your caregiver. Certain foods such as meats and fish can increase uric acid levels. Other foods such as dairy can decrease levels. Your caregiver  can give you a list of foods to avoid. HOME CARE INSTRUCTIONS   Do not take aspirin to relieve pain. This raises uric acid levels.   Only take over-the-counter or prescription medicines for pain, discomfort, or fever as directed by your caregiver.   Rest the joint as much as possible. When in bed, keep sheets and blankets off painful areas.   Keep the affected joint raised (elevated).   Use crutches if the painful joint is in your leg.   Drink enough water and fluids to keep your urine clear or pale yellow. This helps your body get rid of uric acid. Do not drink alcoholic beverages. They slow the passage of uric acid.   Follow your caregiver's dietary instructions. Pay careful attention to the amount of protein you eat. Your daily diet should emphasize fruits, vegetables, whole grains, and fat-free or low-fat milk products.   Maintain a healthy body weight.  SEEK MEDICAL CARE IF:   You have an oral temperature above 102 F (38.9 C).   You develop diarrhea, vomiting, or any side effects from medicines.   You do not feel better in 24 hours, or you are getting worse.  SEEK IMMEDIATE MEDICAL CARE IF:   Your joint becomes suddenly more tender and you have:   Chills.   An oral temperature above 102 F (38.9 C), not controlled by medicine.  MAKE SURE YOU:   Understand these instructions.   Will watch your condition.   Will get help right away if you are not doing well or get worse.  Document Released: 02/25/2000 Document Revised: 02/16/2011 Document Reviewed: 06/07/2009 [employer] Patient Information 2012 [employer]. 

## 2011-06-19 NOTE — ED Provider Notes (Signed)
History     CSN: [id number]  Arrival date & time 06/19/11  4098   First MD Initiated Contact with Patient 06/19/11 1009      Chief Complaint  Patient presents with  . Ankle Pain    (Consider location/radiation/quality/duration/timing/severity/associated sxs/prior treatment) Patient is a 51 y.o. male presenting with ankle pain. The history is provided by the patient.  Ankle Pain  The incident occurred more than 2 days ago. The incident occurred at home. There was no injury mechanism. The pain is present in the right ankle. The quality of the pain is described as aching. The pain is moderate. The pain has been constant since onset. Pertinent negatives include no numbness, no inability to bear weight, no loss of motion, no muscle weakness, no loss of sensation and no tingling. He reports no foreign bodies present. The symptoms are aggravated by activity, bearing weight and palpation. Treatments tried: ace wrap. The treatment provided no relief.    Past Medical History  Diagnosis Date  . Hypertension   . High cholesterol   . Substance abuse     h/o excessive alcohol use; pt says he limits use to one to 2 beers daily he currently  . Neck pain   . Back pain   . Abdominal pain   . Hyperlipidemia   . Adenomatous colon polyp 2008  . Constipation     Past Surgical History  Procedure Date  . Hand surgery   . Colonoscopy 10/09/2006    3 mm pedunculated sigmoid colon polyp removed/8-mm sessile hepatic flexure polyp (tubular villous adenoma) removed/6-mm  descending colon polyp removed/small internal hemorrhoids  . Colonoscopy 02/28/2011    Procedure: COLONOSCOPY;  Surgeon: Arlyce Harman, MD;  Location: [facility];  Service: Endoscopy;  Laterality: N/A;  9:30    Family History  Problem Relation Age of Onset  . Hypertension Mother   . Hypertension Father   . Hypertension Sister   . Hypertension Brother   . Colon cancer Neg Hx     History  Substance Use Topics  . Smoking  status: Former Smoker -- 1.0 packs/day for 23 years    Types: Cigarettes    Quit date: 03/13/2004  . Smokeless tobacco: Not on file  . Alcohol Use: 3.6 oz/week    6 Cans of beer per week     can of beer once or twice per wk      Review of Systems  Constitutional: Negative for fever.  HENT: Negative for neck pain and neck stiffness.   Musculoskeletal: Positive for joint swelling and arthralgias. Negative for back pain.  Neurological: Negative for tingling and numbness.  Hematological: Negative for adenopathy.  All other systems reviewed and are negative.    Allergies  Review of patient's allergies indicates no known allergies.  Home Medications   Current Outpatient Rx  Name Route Sig Dispense Refill  . LISINOPRIL-HYDROCHLOROTHIAZIDE 10-12.5 MG PO TABS Oral Take 1 tablet by mouth daily. 90 tablet 0  . LOVASTATIN 20 MG PO TABS Oral Take 1 tablet (20 mg total) by mouth daily. 30 tablet 3    BP 143/86  Pulse 89  Temp 98.4 F (36.9 C)  Resp 18  Ht 6\' 2"  (1.88 m)  Wt 203 lb (92.08 kg)  BMI 26.06 kg/m2  SpO2 100%  Physical Exam  Nursing note and vitals reviewed. Constitutional: He is oriented to person, place, and time. He appears well-developed and well-nourished. No distress.  HENT:  Head: Normocephalic and atraumatic.  Cardiovascular: Normal  rate, regular rhythm, normal heart sounds and intact distal pulses.   No murmur heard. Pulmonary/Chest: Effort normal and breath sounds normal. No respiratory distress.  Abdominal: He exhibits no distension.  Musculoskeletal: Normal range of motion. He exhibits tenderness. He exhibits no edema.       Right ankle: He exhibits normal range of motion, no swelling, no ecchymosis, no deformity, no laceration and normal pulse. tenderness. Lateral malleolus and medial malleolus tenderness found. No head of 5th metatarsal and no proximal fibula tenderness found. Achilles tendon normal.       Feet:       ttp of the anterior right ankle.   No edema, bruising or discoloration.  DP and PT pulses are brisk, sensation intact.  No calf pain or swelling  Neurological: He is alert and oriented to person, place, and time. He exhibits normal muscle tone. Coordination normal.  Skin: Skin is warm and dry.    ED Course  Procedures (including critical care time)  Labs Reviewed  URIC ACID - Abnormal; Notable for the following:    Uric Acid, Serum 9.4 (*)    All other components within normal limits   Dg Ankle Complete Right  06/19/2011  *RADIOLOGY REPORT*  Clinical Data: Ankle pain for 2 weeks.  No known injury.  RIGHT ANKLE - COMPLETE 3+ VIEW  Comparison: None.  Findings: The mineralization and alignment are normal.  There is no evidence of acute fracture or dislocation.  There are mild tibiotalar degenerative changes and mild spurring of both malleoli. No focal soft tissue swelling is evident.  IMPRESSION: No acute osseous findings.  Mild degenerative changes.  Original Report Authenticated By: Gerrianne Scale, M.D.          MDM    Patient has localized ttp of the right ankle.  No open wounds to suggest cellulitis.  Family hx of gout per patient.  Uric acid is elevated.  Sx's likely gout.  Will treat with pain medication and steroids.     Patient / Family / Caregiver understand and agree with initial ED impression and plan with expectations set for ED visit. Pt stable in ED with no significant deterioration in condition. Pt feels improved after observation and/or treatment in ED.        Ranee Peasley L. [place], [place] 06/21/11 2206

## 2011-06-23 NOTE — ED Provider Notes (Signed)
Medical screening examination/treatment/procedure(s) were performed by non-physician practitioner and as supervising physician I was immediately available for consultation/collaboration.   Trevino Wyatt L Syd Newsome, MD 06/23/11 1403 

## 2011-07-02 ENCOUNTER — Emergency Department (HOSPITAL_COMMUNITY)
Admission: EM | Admit: 2011-07-02 | Discharge: 2011-07-02 | Disposition: A | Discharge status: Home / Self Care | Payer: Self-pay | Attending: Emergency Medicine | Admitting: Emergency Medicine

## 2011-07-02 ENCOUNTER — Encounter (HOSPITAL_COMMUNITY): Payer: Self-pay

## 2011-07-02 DIAGNOSIS — M25571 Pain in right ankle and joints of right foot: Secondary | ICD-10-CM

## 2011-07-02 DIAGNOSIS — M25473 Effusion, unspecified ankle: Secondary | ICD-10-CM | POA: Insufficient documentation

## 2011-07-02 DIAGNOSIS — Z79899 Other long term (current) drug therapy: Secondary | ICD-10-CM | POA: Insufficient documentation

## 2011-07-02 DIAGNOSIS — E789 Disorder of lipoprotein metabolism, unspecified: Secondary | ICD-10-CM | POA: Insufficient documentation

## 2011-07-02 DIAGNOSIS — M25476 Effusion, unspecified foot: Secondary | ICD-10-CM | POA: Insufficient documentation

## 2011-07-02 DIAGNOSIS — E785 Hyperlipidemia, unspecified: Secondary | ICD-10-CM | POA: Insufficient documentation

## 2011-07-02 DIAGNOSIS — M25579 Pain in unspecified ankle and joints of unspecified foot: Secondary | ICD-10-CM | POA: Insufficient documentation

## 2011-07-02 DIAGNOSIS — I10 Essential (primary) hypertension: Secondary | ICD-10-CM | POA: Insufficient documentation

## 2011-07-02 MED ORDER — INDOMETHACIN 25 MG PO CAPS: 25.0000 mg | ORAL_CAPSULE | Freq: Three times a day (TID) | ORAL | Status: DC | PRN

## 2011-07-02 NOTE — ED Notes (Signed)
Pt presents to er with c/o of right ankle pain that started yesterday, pt states that he has had problems with gout in the same ankle before and the pain is the same as before. Denies any injury, cms intact,

## 2011-07-02 NOTE — ED Provider Notes (Signed)
History     CSN: [id number]  Arrival date & time 07/02/11  4098   First MD Initiated Contact with Patient 07/02/11 [phone]      Chief Complaint  Patient presents with  . Ankle Pain    HPI SUBJECTIVE: Tristan Cook is a 51 y.o. male who presents to the Emergency Department for pain in his right ankle. Onset yesterday, no known injury. Quality described as aching. Moderate pain that is constant. Pertinent negatives include no numbness, no inability to rear weight, no loss of motion, no muscle weakness, no tingling. He reports no foreign body. Symptoms are worse with activity, bearing weight and palpation. Nothing makes the pain better. He reports being evaluated here 06/19/11 for same and given prednisone which relieved his symptoms. He was to follow up with his PCP but has not made appointment yet. He states he has had blood work in the past and diagnosed with gout. The history was provided by the patient.  Past Medical History  Diagnosis Date  . Hypertension   . High cholesterol   . Substance abuse     h/o excessive alcohol use; pt says he limits use to one to 2 beers daily he currently  . Neck pain   . Back pain   . Abdominal pain   . Hyperlipidemia   . Adenomatous colon polyp 2008  . Constipation     Past Surgical History  Procedure Date  . Hand surgery   . Colonoscopy 10/09/2006    3 mm pedunculated sigmoid colon polyp removed/8-mm sessile hepatic flexure polyp (tubular villous adenoma) removed/6-mm  descending colon polyp removed/small internal hemorrhoids  . Colonoscopy 02/28/2011    Procedure: COLONOSCOPY;  Surgeon: Arlyce Harman, MD;  Location: [facility];  Service: Endoscopy;  Laterality: N/A;  9:30    Family History  Problem Relation Age of Onset  . Hypertension Mother   . Hypertension Father   . Hypertension Sister   . Hypertension Brother   . Colon cancer Neg Hx     History  Substance Use Topics  . Smoking status: Former Smoker -- 1.0 packs/day for 23  years    Types: Cigarettes    Quit date: 03/13/2004  . Smokeless tobacco: Not on file  . Alcohol Use: 3.6 oz/week    6 Cans of beer per week     can of beer once or twice per wk      Review of Systems  Constitutional: Negative for fever and chills.  HENT: Negative.   Respiratory: Negative.   Musculoskeletal:       Right ankle pain.  Skin:       No wound.  Neurological: Negative for dizziness and headaches.  Psychiatric/Behavioral: Negative for confusion. The patient is not nervous/anxious.     Allergies  Review of patient's allergies indicates no known allergies.  Home Medications   Current Outpatient Rx  Name Route Sig Dispense Refill  . LISINOPRIL-HYDROCHLOROTHIAZIDE 10-12.5 MG PO TABS Oral Take 1 tablet by mouth daily. 90 tablet 0  . LOVASTATIN 20 MG PO TABS Oral Take 1 tablet (20 mg total) by mouth daily. 30 tablet 3  . PREDNISONE 10 MG PO TABS  Take 6 tablets day one, 5 tablets day two, 4 tablets day three, 3 tablets day four, 2 tablets day five, then 1 tablet day six 21 tablet 0    BP 139/82  Pulse 95  Temp(Src) 98.3 F (36.8 C) (Oral)  Resp 18  Ht 6\' 2"  (1.88 m)  Wt 205 lb (92.987 kg)  BMI 26.32 kg/m2  SpO2 100%  Physical Exam  Constitutional: He is oriented to person, place, and time. He appears well-developed and well-nourished. No distress.  HENT:  Head: Normocephalic.  Neck: Neck supple.  Pulmonary/Chest: Effort normal.  Musculoskeletal:       Right ankle with minimal swelling. Tender with palpation inner aspect. Pain with passive range of motion.   Neurological: He is alert and oriented to person, place, and time.       Pedal pulse present and strong. Adequate circulation.  Skin: Skin is warm and dry.       intact  Psychiatric: He has a normal mood and affect. His behavior is normal. Judgment and thought content normal.   Assessment: Right ankle pain  Diff. Dx: Gout   Tendonitis  Plan:  Indocin Rx 25 mg po tid    The nature of gout is  fully explained, including dietary relationship, acute and interval phase and treatment of both. Long term complications such as kidney stones, tophi and arthritis are discussed. Avoidance of alcohol recommended, and written literature is given along with a low purine diet. Indications for the use of allopurinol for prophylaxis and the use of colchicine to prevent or treat flare-ups is also discussed. Proper use of indomethacin for acute attacks discussed, and its side effects. Call if further attacks occur, or this one does not resolve promptly.   ED Course: Discussed with patient need for follow up with PCP for further evaluation and treatment plan.  Procedures   MDM I have reviewed this patient's vital signs, nurses notes, appropriate labs and imaging.           [facility] Orlene Och, NP 07/02/11 0900

## 2011-07-02 NOTE — Discharge Instructions (Signed)
It is important that you follow up with Dr. Lodema Hong as soon as possible. Follow the diet we give you and take the medication as directed. Return here as needed.

## 2011-07-02 NOTE — ED Notes (Signed)
Pt c/o r ankle pain since yesterday.  Denies injury, reports history of gout.

## 2011-07-03 NOTE — ED Provider Notes (Signed)
Medical screening examination/treatment/procedure(s) were performed by non-physician practitioner and as supervising physician I was immediately available for consultation/collaboration.  Jessia Kief S. Delaine Canter, MD 07/03/11 2021 

## 2011-07-11 ENCOUNTER — Encounter: Payer: Self-pay | Admitting: Family Medicine

## 2011-07-11 ENCOUNTER — Ambulatory Visit (INDEPENDENT_AMBULATORY_CARE_PROVIDER_SITE_OTHER): Payer: Self-pay | Admitting: Family Medicine

## 2011-07-11 VITALS — BP 120/80 | HR 86 | Resp 16 | Ht 74.0 in | Wt 203.0 lb

## 2011-07-11 DIAGNOSIS — Z125 Encounter for screening for malignant neoplasm of prostate: Secondary | ICD-10-CM

## 2011-07-11 DIAGNOSIS — M109 Gout, unspecified: Secondary | ICD-10-CM | POA: Insufficient documentation

## 2011-07-11 DIAGNOSIS — E785 Hyperlipidemia, unspecified: Secondary | ICD-10-CM

## 2011-07-11 DIAGNOSIS — I10 Essential (primary) hypertension: Secondary | ICD-10-CM

## 2011-07-11 MED ORDER — ALLOPURINOL 300 MG PO TABS: 300.0000 mg | ORAL_TABLET | Freq: Every day | ORAL | Status: DC

## 2011-07-11 MED ORDER — LISINOPRIL-HYDROCHLOROTHIAZIDE 10-12.5 MG PO TABS: 1.0000 | ORAL_TABLET | Freq: Every day | ORAL | Status: DC

## 2011-07-11 NOTE — Assessment & Plan Note (Signed)
First episode of acute flare, now resolved, pt to take allopurinol to lower uric acid level

## 2011-07-11 NOTE — Assessment & Plan Note (Signed)
Hyperlipidemia:Low fat diet discussed and encouraged.   

## 2011-07-11 NOTE — Assessment & Plan Note (Signed)
Controlled, no change in medication  

## 2011-07-11 NOTE — Patient Instructions (Signed)
F/u in 1 year.  Call if you need me before Fasting chem 7, uric acid and PSA in 4 month  Medication is sent in

## 2011-07-11 NOTE — Progress Notes (Signed)
  Subjective:    Patient ID: Tristan Cook, male    DOB: 07-30-1960, 51 y.o.   MRN: [id number]  HPI Pt seen in the eD on 4/21 with acute gout flare of right ankle, has been eating excessive quantities of seafood. Uric acid was high, no significant pain and swelling currently, this is his first episode. Still has indomethacin, which he is not taking since he is currently symptom free.   Review of Systems See HPI Denies recent fever or chills. Denies sinus pressure, nasal congestion, ear pain or sore throat. Denies chest congestion, productive cough or wheezing. Denies chest pains, palpitations and leg swelling Denies headaches, seizures, numbness, or tingling. Denies depression, anxiety or insomnia. Denies skin break down or rash.        Objective:   Physical Exam Patient alert and oriented and in no cardiopulmonary distress.  HEENT: No facial asymmetry, EOMI, no sinus tenderness,  oropharynx pink and moist.  Neck supple no adenopathy.  Chest: Clear to auscultation bilaterally.  CVS: S1, S2 no murmurs, no S3.  ABD: Soft non tender. Bowel sounds normal.  Ext: No edema  MS: Adequate ROM spine, shoulders, hips and knees.No swelling , redness, warmth or tenderness of right ankle  Skin: Intact, no ulcerations or rash noted.  Psych: Good eye contact, normal affect. Memory intact not anxious or depressed appearing.  CNS: CN 2-12 intact, power, tone and sensation normal throughout.        Assessment & Plan:

## 2011-09-13 ENCOUNTER — Emergency Department (HOSPITAL_COMMUNITY)
Admission: EM | Admit: 2011-09-13 | Discharge: 2011-09-13 | Disposition: A | Discharge status: Home / Self Care | Payer: Self-pay | Attending: Emergency Medicine | Admitting: Emergency Medicine

## 2011-09-13 ENCOUNTER — Encounter (HOSPITAL_COMMUNITY): Payer: Self-pay | Admitting: Emergency Medicine

## 2011-09-13 DIAGNOSIS — E7800 Pure hypercholesterolemia, unspecified: Secondary | ICD-10-CM | POA: Insufficient documentation

## 2011-09-13 DIAGNOSIS — M109 Gout, unspecified: Secondary | ICD-10-CM

## 2011-09-13 DIAGNOSIS — Z860100 Personal history of colon polyps, unspecified: Secondary | ICD-10-CM | POA: Insufficient documentation

## 2011-09-13 DIAGNOSIS — Z87891 Personal history of nicotine dependence: Secondary | ICD-10-CM | POA: Insufficient documentation

## 2011-09-13 DIAGNOSIS — I10 Essential (primary) hypertension: Secondary | ICD-10-CM | POA: Insufficient documentation

## 2011-09-13 DIAGNOSIS — E785 Hyperlipidemia, unspecified: Secondary | ICD-10-CM | POA: Insufficient documentation

## 2011-09-13 MED ORDER — OXYCODONE-ACETAMINOPHEN 5-325 MG PO TABS: 1.0000 | ORAL_TABLET | ORAL | Status: AC | PRN

## 2011-09-13 MED ORDER — INDOMETHACIN 50 MG PO CAPS: 50.0000 mg | ORAL_CAPSULE | Freq: Three times a day (TID) | ORAL | Status: DC

## 2011-09-13 MED ORDER — OXYCODONE-ACETAMINOPHEN 5-325 MG PO TABS
1.0000 | ORAL_TABLET | ORAL | Status: DC | PRN
Start: 2011-09-13 — End: 2011-09-13

## 2011-09-13 MED ORDER — PREDNISONE 10 MG PO TABS: ORAL_TABLET | ORAL | Status: DC

## 2011-09-13 NOTE — ED Provider Notes (Signed)
History     CSN: [id number]  Arrival date & time 09/13/11  0865   First MD Initiated Contact with Patient 09/13/11 [phone]      Chief Complaint  Patient presents with  . 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  He is taking allopurinol and has also taken indomethacin for 2 days,  Although was prescribed a low dose of 25 mg tid.  Pain is persistent and not improved.  The pain starts at his left lateral foot at his mtp joint and has now started to radiate across the lateral dorsum of his foot.  He has increased swelling and warmth to touch and has had prior gouty flares at this location.  He denies any injury.  He states he is under increased stress with school and his gout seems to flare when under increased stress.  He denies fevers or chills,  No nausea or other complaint.  The history is provided by the patient.    Past Medical History  Diagnosis Date  . Hypertension   . High cholesterol   . Substance abuse     h/o excessive alcohol use; pt says he limits use to one to 2 beers daily he currently  . Neck pain   . Back pain   . Abdominal pain   . Hyperlipidemia   . Adenomatous colon polyp 2008  . Constipation     Past Surgical History  Procedure Date  . Hand surgery   . Colonoscopy 10/09/2006    3 mm pedunculated sigmoid colon polyp removed/8-mm sessile hepatic flexure polyp (tubular villous adenoma) removed/6-mm  descending colon polyp removed/small internal hemorrhoids  . Colonoscopy 02/28/2011    Procedure: COLONOSCOPY;  Surgeon: Arlyce Harman, MD;  Location: [facility];  Service: Endoscopy;  Laterality: N/A;  9:30    Family History  Problem Relation Age of Onset  . Hypertension Mother   . Hypertension Father   . Hypertension Sister   . Hypertension Brother   . Colon cancer Neg Hx     History    Substance Use Topics  . Smoking status: Former Smoker -- 1.0 packs/day for 23 years    Types: Cigarettes    Quit date: 03/13/2004  . Smokeless tobacco: Not on file  . Alcohol Use: 3.6 oz/week    6 Cans of beer per week     can of beer once or twice per wk      Review of Systems  Musculoskeletal: Positive for joint swelling and arthralgias.  Skin: Negative for wound.  Neurological: Negative for weakness and numbness.    Allergies  Review of patient's allergies indicates no known allergies.  Home Medications   Current Outpatient Rx  Name Route Sig Dispense Refill  . ALLOPURINOL 300 MG PO TABS Oral Take 300 mg by mouth daily.    . INDOMETHACIN 25 MG PO CAPS Oral Take 25 mg by mouth 3 (three) times daily as needed.    [name] LISINOPRIL-HYDROCHLOROTHIAZIDE 10-12.5 MG PO TABS Oral Take 1 tablet by mouth daily.    . INDOMETHACIN 50 MG PO CAPS Oral Take 1 capsule (50 mg total) by mouth 3 (three) times daily with meals. 21 capsule 0  . OXYCODONE-ACETAMINOPHEN 5-325 MG PO TABS Oral Take 1 tablet by mouth every 4 (four) hours as needed for pain. 20 tablet 0    BP 119/73  Pulse 108  Temp  98 F (36.7 C) (Oral)  Resp 20  Ht 6\' 2"  (1.88 m)  Wt 204 lb (92.534 kg)  BMI 26.19 kg/m2  SpO2 97%  Physical Exam  Constitutional: He appears well-developed and well-nourished.  HENT:  Head: Atraumatic.  Neck: Normal range of motion.  Cardiovascular:       Pulses equal bilaterally  Musculoskeletal: He exhibits edema and tenderness.       Feet:       TTP,  Slight edema and increased warmth at lateral foot.  No skin lesions, wounds,  Or punctures,  Skin intact.  Pedal pulses normal.  Neurological: He is alert. He has normal strength. He displays normal reflexes. No sensory deficit.       Equal strength  Skin: Skin is warm and dry.  Psychiatric: He has a normal mood and affect.    ED Course  Procedures (including critical care time)  Labs Reviewed - No data to display No results  found.   1. Gout attack       MDM  Pt to continue with allopurinol.  Increased indocin to 50 mg tid.  Percocet prescribed,  Elevation,  Warm compresses.  F/u pcp if not improved over the next several days.        Burgess Amor, [place] 09/13/11 [phone]

## 2011-09-13 NOTE — ED Notes (Signed)
History of gout. Pt states takes daily meds for it.

## 2011-09-13 NOTE — ED Provider Notes (Signed)
Medical screening examination/treatment/procedure(s) were performed by non-physician practitioner and as supervising physician I was immediately available for consultation/collaboration. Estellar Cadena, MD, FACEP   Kolyn Rozario L Symphoni Helbling, MD 09/13/11 1505 

## 2011-09-16 ENCOUNTER — Emergency Department (HOSPITAL_COMMUNITY): Payer: Self-pay

## 2011-09-16 ENCOUNTER — Emergency Department (HOSPITAL_COMMUNITY)
Admission: EM | Admit: 2011-09-16 | Discharge: 2011-09-16 | Disposition: A | Discharge status: Home / Self Care | Payer: Self-pay | Attending: Emergency Medicine | Admitting: Emergency Medicine

## 2011-09-16 ENCOUNTER — Encounter (HOSPITAL_COMMUNITY): Payer: Self-pay | Admitting: *Deleted

## 2011-09-16 DIAGNOSIS — Z860100 Personal history of colon polyps, unspecified: Secondary | ICD-10-CM | POA: Insufficient documentation

## 2011-09-16 DIAGNOSIS — E7800 Pure hypercholesterolemia, unspecified: Secondary | ICD-10-CM | POA: Insufficient documentation

## 2011-09-16 DIAGNOSIS — Z79899 Other long term (current) drug therapy: Secondary | ICD-10-CM | POA: Insufficient documentation

## 2011-09-16 DIAGNOSIS — M79609 Pain in unspecified limb: Secondary | ICD-10-CM | POA: Insufficient documentation

## 2011-09-16 DIAGNOSIS — I10 Essential (primary) hypertension: Secondary | ICD-10-CM | POA: Insufficient documentation

## 2011-09-16 DIAGNOSIS — M79672 Pain in left foot: Secondary | ICD-10-CM

## 2011-09-16 DIAGNOSIS — F1910 Other psychoactive substance abuse, uncomplicated: Secondary | ICD-10-CM | POA: Insufficient documentation

## 2011-09-16 DIAGNOSIS — E785 Hyperlipidemia, unspecified: Secondary | ICD-10-CM | POA: Insufficient documentation

## 2011-09-16 MED ORDER — COLCHICINE 0.6 MG PO TABS
1.2000 mg | ORAL_TABLET | Freq: Once | ORAL | Status: AC
  Administered 2011-09-16: 1.2 mg via ORAL

## 2011-09-16 MED ORDER — COLCHICINE 0.6 MG PO TABS: 0.6000 mg | ORAL_TABLET | Freq: Every day | ORAL | Status: DC

## 2011-09-16 MED ORDER — ONDANSETRON HCL 4 MG PO TABS
4.0000 mg | ORAL_TABLET | Freq: Once | ORAL | Status: AC
  Administered 2011-09-16: 4 mg via ORAL
  Filled 2011-09-16: qty 1

## 2011-09-16 MED ORDER — KETOROLAC TROMETHAMINE 60 MG/2ML IM SOLN
60.0000 mg | Freq: Once | INTRAMUSCULAR | Status: AC
  Administered 2011-09-16: 60 mg via INTRAMUSCULAR

## 2011-09-16 MED ORDER — KETOROLAC TROMETHAMINE 60 MG/2ML IM SOLN
INTRAMUSCULAR | Status: AC
  Administered 2011-09-16: 60 mg via INTRAMUSCULAR
  Filled 2011-09-16: qty 2

## 2011-09-16 MED ORDER — COLCHICINE 0.6 MG PO TABS
ORAL_TABLET | ORAL | Status: AC
  Administered 2011-09-16: 1.2 mg via ORAL
  Filled 2011-09-16: qty 2

## 2011-09-16 NOTE — ED Provider Notes (Signed)
History     CSN: [id number]  Arrival date & time 09/16/11  2058   First MD Initiated Contact with Patient 09/16/11 2129      Chief Complaint  Patient presents with  . Foot Pain    (Consider location/radiation/quality/duration/timing/severity/associated sxs/prior treatment) Patient is a 51 y.o. male presenting with lower extremity pain. The history is provided by the patient.  Foot Pain This is a chronic problem. The current episode started in the past 7 days. The problem occurs constantly. The problem has been gradually worsening. Associated symptoms include arthralgias. Pertinent negatives include no abdominal pain, chest pain, chills, coughing, fever or neck pain. The symptoms are aggravated by standing and walking. He has tried NSAIDs and oral narcotics for the symptoms. The treatment provided no relief.    Past Medical History  Diagnosis Date  . Hypertension   . High cholesterol   . Substance abuse     h/o excessive alcohol use; pt says he limits use to one to 2 beers daily he currently  . Neck pain   . Back pain   . Abdominal pain   . Hyperlipidemia   . Adenomatous colon polyp 2008  . Constipation     Past Surgical History  Procedure Date  . Hand surgery   . Colonoscopy 10/09/2006    3 mm pedunculated sigmoid colon polyp removed/8-mm sessile hepatic flexure polyp (tubular villous adenoma) removed/6-mm  descending colon polyp removed/small internal hemorrhoids  . Colonoscopy 02/28/2011    Procedure: COLONOSCOPY;  Surgeon: Arlyce Harman, MD;  Location: [facility];  Service: Endoscopy;  Laterality: N/A;  9:30    Family History  Problem Relation Age of Onset  . Hypertension Mother   . Hypertension Father   . Hypertension Sister   . Hypertension Brother   . Colon cancer Neg Hx     History  Substance Use Topics  . Smoking status: Former Smoker -- 1.0 packs/day for 23 years    Types: Cigarettes    Quit date: 03/13/2004  . Smokeless tobacco: Not on file  .  Alcohol Use: 3.6 oz/week    6 Cans of beer per week     can of beer once or twice per wk      Review of Systems  Constitutional: Negative for fever, chills and activity change.       All ROS Neg except as noted in HPI  HENT: Negative for nosebleeds and neck pain.   Eyes: Negative for photophobia and discharge.  Respiratory: Negative for cough, shortness of breath and wheezing.   Cardiovascular: Negative for chest pain and palpitations.  Gastrointestinal: Positive for constipation. Negative for abdominal pain and blood in stool.  Genitourinary: Negative for dysuria, frequency and hematuria.  Musculoskeletal: Positive for back pain and arthralgias.  Skin: Negative.   Neurological: Negative for dizziness, seizures and speech difficulty.  Psychiatric/Behavioral: Negative for hallucinations and confusion.    Allergies  Review of patient's allergies indicates no known allergies.  Home Medications   Current Outpatient Rx  Name Route Sig Dispense Refill  . ALLOPURINOL 300 MG PO TABS Oral Take 300 mg by mouth daily.    . INDOMETHACIN 25 MG PO CAPS Oral Take 25 mg by mouth 3 (three) times daily as needed.    . INDOMETHACIN 50 MG PO CAPS Oral Take 1 capsule (50 mg total) by mouth 3 (three) times daily with meals. 21 capsule 0  . LISINOPRIL-HYDROCHLOROTHIAZIDE 10-12.5 MG PO TABS Oral Take 1 tablet by mouth daily.    [name]  OXYCODONE-ACETAMINOPHEN 5-325 MG PO TABS Oral Take 1 tablet by mouth every 4 (four) hours as needed for pain. 20 tablet 0    BP 127/82  Pulse 101  Temp 99 F (37.2 C) (Oral)  Resp 20  Ht 6\' 2"  (1.88 m)  Wt 204 lb (92.534 kg)  BMI 26.19 kg/m2  SpO2 100%  Physical Exam  Nursing note and vitals reviewed. Constitutional: He is oriented to person, place, and time. He appears well-developed and well-nourished.  Non-toxic appearance.  HENT:  Head: Normocephalic.  Right Ear: Tympanic membrane and external ear normal.  Left Ear: Tympanic membrane and external ear normal.    Eyes: EOM and lids are normal. Pupils are equal, round, and reactive to light.  Neck: Normal range of motion. Neck supple. Carotid bruit is not present.  Cardiovascular: Normal rate, regular rhythm, normal heart sounds, intact distal pulses and normal pulses.   Pulmonary/Chest: Breath sounds normal. No respiratory distress.  Abdominal: Soft. Bowel sounds are normal. There is no tenderness. There is no guarding.  Musculoskeletal: Normal range of motion.       Pain at the lateral left foot in the area of the MTP joint. There mild to mod redness of the dorsum of the foot. Achilles intact. No lesions between the toes. DP 2+. No puncture area of the plantar surface of the foot.  Lymphadenopathy:       Head (right side): No submandibular adenopathy present.       Head (left side): No submandibular adenopathy present.    He has no cervical adenopathy.  Neurological: He is alert and oriented to person, place, and time. He has normal strength. No cranial nerve deficit or sensory deficit.  Skin: Skin is warm and dry.  Psychiatric: He has a normal mood and affect. His speech is normal.    ED Course  Procedures (including critical care time)  Labs Reviewed - No data to display No results found.   No diagnosis found.    MDM  I have reviewed nursing notes, vital signs, and all appropriate lab and imaging results for this patient.  Patient has had recurrent problems with the left foot. He has been diagnosed with gout. He has also been diagnosed with degenerative changes of multiple sites. The x-ray of the left foot is negative for occult fracture, dislocation, or foreign body. There is no gas or free air noted. Patient treated with IM Toradol and colchicine.      Kathie Dike, [place] 09/16/11 2240

## 2011-09-16 NOTE — ED Notes (Signed)
Pt reporting continued pain in left foot.  Was seen in department 3 days ago for gout and was pain medication and gout medication. Reports medication began to make his side hurt so he stopped taking it.

## 2011-09-16 NOTE — ED Notes (Signed)
Pain in L foot w/lateral redness, swelling and warmth.  Began June 30, was given gout med.  After taking this, pain subsided then returned Friday.  He has soaked in Epsom salts and elevated foot w/out pain relief.

## 2011-09-17 NOTE — ED Provider Notes (Signed)
Medical screening examination/treatment/procedure(s) were performed by non-physician practitioner and as supervising physician I was immediately available for consultation/collaboration.   Laray Anger, DO 09/17/11 0214

## 2011-09-25 ENCOUNTER — Emergency Department (HOSPITAL_COMMUNITY)
Admission: EM | Admit: 2011-09-25 | Discharge: 2011-09-25 | Disposition: A | Discharge status: Home / Self Care | Payer: Self-pay | Attending: Emergency Medicine | Admitting: Emergency Medicine

## 2011-09-25 ENCOUNTER — Encounter (HOSPITAL_COMMUNITY): Payer: Self-pay

## 2011-09-25 DIAGNOSIS — E785 Hyperlipidemia, unspecified: Secondary | ICD-10-CM | POA: Insufficient documentation

## 2011-09-25 DIAGNOSIS — Z87891 Personal history of nicotine dependence: Secondary | ICD-10-CM | POA: Insufficient documentation

## 2011-09-25 DIAGNOSIS — E7800 Pure hypercholesterolemia, unspecified: Secondary | ICD-10-CM | POA: Insufficient documentation

## 2011-09-25 DIAGNOSIS — M79609 Pain in unspecified limb: Secondary | ICD-10-CM | POA: Insufficient documentation

## 2011-09-25 DIAGNOSIS — M79672 Pain in left foot: Secondary | ICD-10-CM

## 2011-09-25 DIAGNOSIS — I10 Essential (primary) hypertension: Secondary | ICD-10-CM | POA: Insufficient documentation

## 2011-09-25 MED ORDER — COLCHICINE 0.6 MG PO TABS
0.6000 mg | ORAL_TABLET | Freq: Once | ORAL | Status: AC
  Administered 2011-09-25: 0.6 mg via ORAL
  Filled 2011-09-25: qty 1

## 2011-09-25 MED ORDER — KETOROLAC TROMETHAMINE 60 MG/2ML IM SOLN
60.0000 mg | Freq: Once | INTRAMUSCULAR | Status: AC
  Administered 2011-09-25: 60 mg via INTRAMUSCULAR
  Filled 2011-09-25: qty 2

## 2011-09-25 MED ORDER — INDOMETHACIN 25 MG PO CAPS: 50.0000 mg | ORAL_CAPSULE | Freq: Three times a day (TID) | ORAL | Status: DC | PRN

## 2011-09-25 NOTE — ED Provider Notes (Signed)
History     CSN: [id number]  Arrival date & time 09/25/11  0506   First MD Initiated Contact with Patient 09/25/11 [phone]      Chief Complaint  Patient presents with  . Gout    (Consider location/radiation/quality/duration/timing/severity/associated sxs/prior treatment) HPI History provided by patient. Left foot pain that he attributes to gout. Has had this diagnosis for some time and is followed by primary care Dr. Lodema Hong. He recently ran out of prescription of indomethacin - was taking 50 mg. Has been seen here 3 times in the last week or so, and states the last time he was here he received 2 pills and a shot that made him feel much better. Records reviewed and patient received Toradol and colchicine. He took percocet PT and medication never seems to help him further he declines any narcotics. No associated fevers or chills. No vomiting. No redness. No alleviating factors. Pain is sharp and severe and not radiating from a lateral aspect of distal left foot. As the same pain in the same symptoms that he recurrently gets.he has never seen a podiatrist. He has remote ankle injury from playing basketball. Past Medical History  Diagnosis Date  . Hypertension   . High cholesterol   . Substance abuse     h/o excessive alcohol use; pt says he limits use to one to 2 beers daily he currently  . Neck pain   . Back pain   . Abdominal pain   . Hyperlipidemia   . Adenomatous colon polyp 2008  . Constipation     Past Surgical History  Procedure Date  . Hand surgery   . Colonoscopy 10/09/2006    3 mm pedunculated sigmoid colon polyp removed/8-mm sessile hepatic flexure polyp (tubular villous adenoma) removed/6-mm  descending colon polyp removed/small internal hemorrhoids  . Colonoscopy 02/28/2011    Procedure: COLONOSCOPY;  Surgeon: Arlyce Harman, MD;  Location: [facility];  Service: Endoscopy;  Laterality: N/A;  9:30    Family History  Problem Relation Age of Onset  . Hypertension  Mother   . Hypertension Father   . Hypertension Sister   . Hypertension Brother   . Colon cancer Neg Hx     History  Substance Use Topics  . Smoking status: Former Smoker -- 1.0 packs/day for 23 years    Types: Cigarettes    Quit date: 03/13/2004  . Smokeless tobacco: Not on file  . Alcohol Use: 3.6 oz/week    6 Cans of beer per week     can of beer once or twice per wk      Review of Systems  Constitutional: Negative for fever and chills.  HENT: Negative for neck pain and neck stiffness.   Eyes: Negative for pain.  Respiratory: Negative for shortness of breath.   Cardiovascular: Negative for chest pain.  Gastrointestinal: Negative for abdominal pain.  Genitourinary: Negative for dysuria.  Musculoskeletal: Negative for back pain.  Skin: Negative for rash.  Neurological: Negative for headaches.  All other systems reviewed and are negative.    Allergies  Review of patient's allergies indicates no known allergies.  Home Medications   Current Outpatient Rx  Name Route Sig Dispense Refill  . ALLOPURINOL 300 MG PO TABS Oral Take 300 mg by mouth daily.     [name] LISINOPRIL-HYDROCHLOROTHIAZIDE 10-12.5 MG PO TABS Oral Take 1 tablet by mouth daily.    . COLCHICINE 0.6 MG PO TABS Oral Take 1 tablet (0.6 mg total) by mouth daily. 10 tablet 0  .  INDOMETHACIN 25 MG PO CAPS Oral Take 25 mg by mouth 3 (three) times daily as needed.     . INDOMETHACIN 50 MG PO CAPS Oral Take 1 capsule (50 mg total) by mouth 3 (three) times daily with meals. 21 capsule 0    BP 116/81  Pulse 79  Temp 98.4 F (36.9 C) (Oral)  Resp 16  Ht 6\' 2"  (1.88 m)  Wt 204 lb (92.534 kg)  BMI 26.19 kg/m2  Physical Exam  Constitutional: He is oriented to person, place, and time. He appears well-developed and well-nourished.  HENT:  Head: Normocephalic and atraumatic.  Eyes: Conjunctivae and EOM are normal. Pupils are equal, round, and reactive to light.  Neck: Trachea normal. Neck supple. No thyromegaly  present.  Cardiovascular: Normal rate, regular rhythm, S1 normal, S2 normal and normal pulses.     No systolic murmur is present   No diastolic murmur is present  Pulses:      Radial pulses are 2+ on the right side, and 2+ on the left side.  Pulmonary/Chest: Effort normal and breath sounds normal. He has no wheezes. He has no rhonchi. He has no rales. He exhibits no tenderness.  Abdominal: Soft. Normal appearance and bowel sounds are normal. There is no tenderness. There is no CVA tenderness and negative Murphy's sign.  Musculoskeletal:       Left foot: tender over distal lateral aspect of fifth metatarsal. No significant swelling appreciated. There is no erythema or increased warmth. Distal cap refill and motor and sensorium intact. No bony deformity. No streaking lymphangitis. No tenderness over her ankle or foot otherwise.  Neurological: He is alert and oriented to person, place, and time. He has normal strength. No cranial nerve deficit or sensory deficit. GCS eye subscore is 4. GCS verbal subscore is 5. GCS motor subscore is 6.  Skin: Skin is warm and dry. No rash noted. He is not diaphoretic.  Psychiatric: His speech is normal.       Cooperative and appropriate    ED Course  Procedures (including critical care time)  Toradol. Colchicine.  Plan prescription for indomethacin and close primary care followup. Patient may benefit from seeing a podiatrist as well. He agrees to speak with his primary physician about this.postoperative shoe was provided as well for comfort while ambulating.   MDM  Old records reviewed. Patient had x-ray on previous visit and do not feel repeat imaging is required at this time. Nursing notes reviewed.vital signs reviewed.        Sunnie Nielsen, MD 09/25/11 [phone]

## 2011-09-25 NOTE — ED Notes (Signed)
Left ankle pain states is consistent with his gout pains recently

## 2011-10-05 ENCOUNTER — Other Ambulatory Visit: Payer: Self-pay | Admitting: Family Medicine

## 2011-10-05 ENCOUNTER — Telehealth: Payer: Self-pay | Admitting: Family Medicine

## 2011-10-05 MED ORDER — PREDNISONE (PAK) 5 MG PO TABS: 5.0000 mg | ORAL_TABLET | ORAL | Status: DC

## 2011-10-05 MED ORDER — INDOMETHACIN 50 MG PO CAPS: 50.0000 mg | ORAL_CAPSULE | Freq: Three times a day (TID) | ORAL | Status: DC

## 2011-10-05 NOTE — Telephone Encounter (Signed)
pls refill the indomethacin 50mg  script sent mid July x 1 and send in the pred dose pack which I just entered historically and let him know

## 2011-10-05 NOTE — Telephone Encounter (Signed)
Can you do this or does he need to come in first?

## 2011-10-05 NOTE — Telephone Encounter (Signed)
Pt aware and meds sent  

## 2011-10-06 ENCOUNTER — Emergency Department (HOSPITAL_COMMUNITY)
Admission: EM | Admit: 2011-10-06 | Discharge: 2011-10-06 | Disposition: A | Discharge status: Home / Self Care | Payer: Self-pay | Attending: Emergency Medicine | Admitting: Emergency Medicine

## 2011-10-06 ENCOUNTER — Encounter (HOSPITAL_COMMUNITY): Payer: Self-pay | Admitting: Emergency Medicine

## 2011-10-06 DIAGNOSIS — E7800 Pure hypercholesterolemia, unspecified: Secondary | ICD-10-CM | POA: Insufficient documentation

## 2011-10-06 DIAGNOSIS — Z87891 Personal history of nicotine dependence: Secondary | ICD-10-CM | POA: Insufficient documentation

## 2011-10-06 DIAGNOSIS — M109 Gout, unspecified: Secondary | ICD-10-CM

## 2011-10-06 DIAGNOSIS — I10 Essential (primary) hypertension: Secondary | ICD-10-CM | POA: Insufficient documentation

## 2011-10-06 DIAGNOSIS — E785 Hyperlipidemia, unspecified: Secondary | ICD-10-CM | POA: Insufficient documentation

## 2011-10-06 HISTORY — DX: Gout, unspecified: M10.9

## 2011-10-06 MED ORDER — PREDNISONE 10 MG PO TABS: ORAL_TABLET | ORAL | Status: DC

## 2011-10-06 MED ORDER — PREDNISONE 20 MG PO TABS
60.0000 mg | ORAL_TABLET | Freq: Once | ORAL | Status: AC
  Administered 2011-10-06: 60 mg via ORAL
  Filled 2011-10-06: qty 3

## 2011-10-06 MED ORDER — HYDROMORPHONE HCL PF 1 MG/ML IJ SOLN
1.0000 mg | Freq: Once | INTRAMUSCULAR | Status: AC
  Administered 2011-10-06: 1 mg via INTRAMUSCULAR
  Filled 2011-10-06: qty 1

## 2011-10-06 MED ORDER — TRAMADOL HCL 50 MG PO TABS: 50.0000 mg | ORAL_TABLET | Freq: Four times a day (QID) | ORAL | Status: DC | PRN

## 2011-10-06 NOTE — ED Notes (Signed)
Advised not to drive for the next 12 hours.

## 2011-10-06 NOTE — ED Notes (Signed)
States pain is 5/10, however talking/texting on phone, no acute distress noted. \

## 2011-10-06 NOTE — ED Notes (Signed)
Patient complaining of pain to left foot. States has history of gout and this episode started yesterday.

## 2011-10-06 NOTE — ED Notes (Addendum)
States that he spoke with his doctor yesterday and she called him in a medication for gout, states that he was unable to go and pick it up (cholchicine), did not have the money for the medication.  Requests a "shot" for the pain.

## 2011-10-08 NOTE — ED Provider Notes (Signed)
History     CSN: [id number]  Arrival date & time 10/06/11  2012   First MD Initiated Contact with Patient 10/06/11 2020      Chief Complaint  Patient presents with  . Foot Pain    (Consider location/radiation/quality/duration/timing/severity/associated sxs/prior treatment) HPI Comments: Tristan Cook presents with a 1 day history of increased pain and swelling of his left foot along the lateral edge which has been diagnosed recently as gout.  He has been on multiple medicines in the past including indocin and prednisone (which he states has worked better than any other medicine).  He was also recently prescribed allopurinol by his primary doctor but has not had this medicine filled.  He denies any trauma to the foot.  He has minimized weight bearing and has been elevating his foot without relief. He denies fevers and chills.    The history is provided by the patient.    Past Medical History  Diagnosis Date  . Hypertension   . High cholesterol   . Substance abuse     h/o excessive alcohol use; pt says he limits use to one to 2 beers daily he currently  . Neck pain   . Back pain   . Abdominal pain   . Hyperlipidemia   . Adenomatous colon polyp 2008  . Constipation   . Gout     Past Surgical History  Procedure Date  . Hand surgery   . Colonoscopy 10/09/2006    3 mm pedunculated sigmoid colon polyp removed/8-mm sessile hepatic flexure polyp (tubular villous adenoma) removed/6-mm  descending colon polyp removed/small internal hemorrhoids  . Colonoscopy 02/28/2011    Procedure: COLONOSCOPY;  Surgeon: Arlyce Harman, MD;  Location: [facility];  Service: Endoscopy;  Laterality: N/A;  9:30    Family History  Problem Relation Age of Onset  . Hypertension Mother   . Hypertension Father   . Hypertension Sister   . Hypertension Brother   . Colon cancer Neg Hx     History  Substance Use Topics  . Smoking status: Former Smoker -- 1.0 packs/day for 23 years    Types:  Cigarettes    Quit date: 03/13/2004  . Smokeless tobacco: Not on file  . Alcohol Use: No      Review of Systems  Musculoskeletal: Positive for joint swelling and arthralgias.  Skin: Negative for color change and wound.  Neurological: Negative for weakness and numbness.    Allergies  Review of patient's allergies indicates no known allergies.  Home Medications   Current Outpatient Rx  Name Route Sig Dispense Refill  . ALLOPURINOL 300 MG PO TABS Oral Take 300 mg by mouth daily.     . INDOMETHACIN 50 MG PO CAPS Oral Take 1 capsule (50 mg total) by mouth 3 (three) times daily with meals. 21 capsule 0  . LISINOPRIL-HYDROCHLOROTHIAZIDE 10-12.5 MG PO TABS Oral Take 0.5 tablets by mouth daily.     [name] PREDNISONE 10 MG PO TABS  6, 5, 4, 3, 2 then 1 tablet by mouth daily for 6 days total. 21 tablet 0  . TRAMADOL HCL 50 MG PO TABS Oral Take 1 tablet (50 mg total) by mouth every 6 (six) hours as needed for pain. 15 tablet 0    BP 110/67  Pulse 113  Temp 98.7 F (37.1 C) (Oral)  Resp 14  Ht 6\' 2"  (1.88 m)  Wt 198 lb 4 oz (89.926 kg)  BMI 25.45 kg/m2  SpO2 97%  Physical Exam  Constitutional: He appears well-developed and well-nourished.  HENT:  Head: Atraumatic.  Neck: Normal range of motion.  Cardiovascular:       Pulses equal bilaterally  Musculoskeletal: He exhibits tenderness. He exhibits no edema.       Feet:       ttp left metatarsal base with mild increased warmth and edema.  Skin intact without evidence of wounds or puncture.  No red streaking.  Neurological: He is alert. He has normal strength. He displays normal reflexes. No sensory deficit.       Equal strength  Skin: Skin is warm and dry.  Psychiatric: He has a normal mood and affect.    ED Course  Procedures (including critical care time)  Labs Reviewed - No data to display No results found.   1. Gout       MDM  Prior chart reviewed including recent elevated uric acid level.  Pt has been unable to  afford his allopurinol - encouraged to get filled or discuss with his pcp for alternatives.  Given 6 day prednisone taper,  Tramadol.  Encouraged elevation,  Heat,  Recheck by pcp.  Pt also sees Dr. Romeo Apple,  Suggested alternative eval by him prn.  ? Chronic metatarsalgia vs gout?  Suggested shoe insert cushion with cutout to take pressure off metatarsal.  Doubt infection as source of pain.        Burgess Amor, PA 10/08/11 1330

## 2011-10-08 NOTE — ED Provider Notes (Signed)
Medical screening examination/treatment/procedure(s) were performed by non-physician practitioner and as supervising physician I was immediately available for consultation/collaboration.  Atonya Templer, MD 10/08/11 1935 

## 2011-10-12 ENCOUNTER — Encounter: Payer: Self-pay | Admitting: Gastroenterology

## 2011-10-17 ENCOUNTER — Encounter: Payer: Self-pay | Admitting: Family Medicine

## 2011-10-17 ENCOUNTER — Ambulatory Visit (INDEPENDENT_AMBULATORY_CARE_PROVIDER_SITE_OTHER): Payer: Self-pay | Admitting: Family Medicine

## 2011-10-17 VITALS — BP 120/76 | HR 83 | Resp 18 | Ht 74.0 in | Wt 205.0 lb

## 2011-10-17 DIAGNOSIS — M79609 Pain in unspecified limb: Secondary | ICD-10-CM

## 2011-10-17 DIAGNOSIS — I10 Essential (primary) hypertension: Secondary | ICD-10-CM

## 2011-10-17 DIAGNOSIS — M79672 Pain in left foot: Secondary | ICD-10-CM

## 2011-10-17 DIAGNOSIS — Z125 Encounter for screening for malignant neoplasm of prostate: Secondary | ICD-10-CM

## 2011-10-17 NOTE — Progress Notes (Signed)
  Subjective:    Patient ID: Tristan Cook, male    DOB: 05-03-1960, 51 y.o.   MRN: [id number]  HPI Pt in for Ed f/u for left foot pain has been in the ed 3 to 4 times for the problem. Now pain free but has a small nodule on lateral aspect  Of left foot which is of concern C/o pain and stiffness in left foot with ambulation or on standing   Review of Systems See HPI Denies recent fever or chills. Denies sinus pressure, nasal congestion, ear pain or sore throat. Denies chest congestion, productive cough or wheezing. Denies chest pains, palpitations and leg swelling Denies abdominal pain, nausea, vomiting,diarrhea or constipation.   Denies dysuria, frequency, hesitancy or incontinence.  Denies headaches, seizures, numbness, or tingling. Denies depression, anxiety or insomnia. Denies skin break down or rash.        Objective:   Physical Exam  Patient alert and oriented and in no cardiopulmonary distress.  HEENT: No facial asymmetry, EOMI, no sinus tenderness,  oropharynx pink and moist.  Neck supple no adenopathy.  Chest: Clear to auscultation bilaterally.  CVS: S1, S2 no murmurs, no S3.  ABD: Soft non tender. Bowel sounds normal.  Ext: No edema  MS: Adequate ROM spine, shoulders, hips and knees.Tender over left  Foot with nodule  Skin: Intact, no ulcerations or rash noted.  Psych: Good eye contact, normal affect. Memory intact not anxious or depressed appearing.  CNS: CN 2-12 intact, power, tone and sensation normal throughout.       Assessment & Plan:

## 2011-10-17 NOTE — Patient Instructions (Addendum)
F/u in December. You are referred to Dr Romeo Apple re left foot pain  Labs today cmp, and pSA

## 2011-10-18 LAB — COMPREHENSIVE METABOLIC PANEL
ALT: 8 U/L (ref 0–53)
CO2: 31 mEq/L (ref 19–32)
Calcium: 9.2 mg/dL (ref 8.4–10.5)
Chloride: 99 mEq/L (ref 96–112)
Creat: 1.21 mg/dL (ref 0.50–1.35)
Glucose, Bld: 80 mg/dL (ref 70–99)
Sodium: 139 mEq/L (ref 135–145)
Total Protein: 6.4 g/dL (ref 6.0–8.3)

## 2011-10-18 LAB — PSA: PSA: 0.38 ng/mL (ref <=4.00)

## 2011-10-23 NOTE — Assessment & Plan Note (Signed)
Localized foot pain, ortho to further eval

## 2011-10-23 NOTE — Assessment & Plan Note (Signed)
Controlled, no change in medication DASH diet and commitment to daily physical activity for a minimum of 30 minutes discussed and encouraged, as a part of hypertension management. The importance of attaining a healthy weight is also discussed.  

## 2011-11-02 ENCOUNTER — Ambulatory Visit (INDEPENDENT_AMBULATORY_CARE_PROVIDER_SITE_OTHER): Payer: Self-pay | Admitting: Gastroenterology

## 2011-11-02 ENCOUNTER — Encounter: Payer: Self-pay | Admitting: Gastroenterology

## 2011-11-02 VITALS — BP 141/78 | HR 103 | Temp 98.1°F | Ht 74.0 in | Wt 199.2 lb

## 2011-11-02 DIAGNOSIS — K648 Other hemorrhoids: Secondary | ICD-10-CM

## 2011-11-02 MED ORDER — HYDROCORTISONE ACETATE 25 MG RE SUPP: 25.0000 mg | Freq: Two times a day (BID) | RECTAL | Status: AC

## 2011-11-02 NOTE — Progress Notes (Signed)
  Subjective:    Patient ID: Tristan Cook, male    DOB: 1960/08/03, 51 y.o.   MRN: [id number]  PCP: [facility]  HPI Seen in JAN 2013 for hemorrhoid complaints. Now has burning and itching started last night. If stool is hard & hits on left side and blood comes out. Using ointment otc. Back in school. BMs: once a day. USING A OTC STOOL SOFTENER. DRINKING WATER. STARTED EATING FIBER.    Past Medical History  Diagnosis Date  . Hypertension   . High cholesterol   . Substance abuse     h/o excessive alcohol use; pt says he limits use to one to 2 beers daily he currently  . Neck pain   . Back pain   . Abdominal pain   . Hyperlipidemia   . Adenomatous colon polyp 2008  . Constipation   . Gout     Past Surgical History  Procedure Date  . Hand surgery   . Colonoscopy 10/09/2006    3 mm pedunculated sigmoid colon polyp removed/8-mm sessile hepatic flexure polyp (tubular villous adenoma) removed/6-mm  descending colon polyp removed/small internal hemorrhoids  . Colonoscopy 02/28/2011    Procedure: COLONOSCOPY;  Surgeon: Arlyce Harman, MD;  Location: [facility];  Service: Endoscopy;  Laterality: N/A;  9:30    No Known Allergies  Current Outpatient Prescriptions  Medication Sig Dispense Refill  . lisinopril-hydrochlorothiazide (PRINZIDE,ZESTORETIC) 10-12.5 MG per tablet Take 0.5 tablets by mouth daily.           Review of Systems     Objective:   Physical Exam  Vitals reviewed. Constitutional: He is oriented to person, place, and time. He appears well-nourished. No distress.  HENT:  Head: Normocephalic and atraumatic.  Mouth/Throat: Oropharynx is clear and moist. No oropharyngeal exudate.  Eyes: Pupils are equal, round, and reactive to light. No scleral icterus.  Neck: Normal range of motion. Neck supple.  Cardiovascular: Normal rate, regular rhythm and normal heart sounds.   Pulmonary/Chest: Effort normal and breath sounds normal. No respiratory distress.  Abdominal:  Soft. Bowel sounds are normal. He exhibits no distension. There is no tenderness.  Musculoskeletal: He exhibits no edema.  Neurological: He is alert and oriented to person, place, and time.       NO FOCAL DEFICITS   Psychiatric: He has a normal mood and affect.          Assessment & Plan:

## 2011-11-02 NOTE — Progress Notes (Signed)
Faxed to PCP

## 2011-11-02 NOTE — Assessment & Plan Note (Addendum)
COULD NOT AFFORD APPT WITH SURGERY. FLARE CAUSED BY REPEATED HEAVY LIFTING.  ANUSOL BID FOR 12 DAYS AND REPEAT x1 IF sX NOT RESOLVED. AVOID HEAVY LIFTING. RETURN TO WORK 8/26. HIGH FIBER DIET. DRINK WATER. FOLLOW UP IN 2 MOS. CONSIDER [facility].

## 2011-11-02 NOTE — Patient Instructions (Addendum)
USE ANUSOL TWICE DAILY FOR 12 DAYS AND REPEAT FOR ADDITIONAL 12 DAYS IF SYMPTOMS NOT RESOLVED.  AVOID HEAVY LIFTING. RETURN TO WORK 8/26.  FOLLOW A HIGH FIBER DIET. SEE INFO BELOW.  DRINK WATER TO KEEP URINE LIGHT YELLOW.  FOLLOW UP IN 2 MOS.   High-Fiber Diet A high-fiber diet changes your normal diet to include more whole grains, legumes, fruits, and vegetables. Changes in the diet involve replacing refined carbohydrates with unrefined foods. The calorie level of the diet is essentially unchanged. The Dietary Reference Intake (recommended amount) for adult males is 38 grams per day. For adult females, it is 25 grams per day. Pregnant and lactating women should consume 28 grams of fiber per day.  Fiber is the intact part of a plant that is not broken down during digestion. Functional fiber is fiber that has been isolated from the plant to provide a beneficial effect in the body.  PURPOSE  Increase stool bulk.   Ease and regulate bowel movements.   Lower cholesterol.    INDICATIONS THAT YOU NEED MORE FIBER  Constipation and hemorrhoids.   Uncomplicated diverticulosis (intestine condition) and irritable bowel syndrome.   Weight management.   As a protective measure against hardening of the arteries (atherosclerosis), diabetes, and cancer.   GUIDELINES FOR INCREASING FIBER IN THE DIET  Start adding fiber to the diet slowly. A gradual increase of about 5 more grams (2 slices of whole-wheat bread, 2 servings of most fruits or vegetables, or 1 bowl of high-fiber cereal) per day is best. Too rapid an increase in fiber may result in constipation, flatulence, and bloating.   Drink enough water and fluids to keep your urine clear or pale yellow. Water, juice, or caffeine-free drinks are recommended. Not drinking enough fluid may cause constipation.   Eat a variety of high-fiber foods rather than one type of fiber.   Try to increase your intake of fiber through using high-fiber foods  rather than fiber pills or supplements that contain small amounts of fiber.   The goal is to change the types of food eaten. Do not supplement your present diet with high-fiber foods, but replace foods in your present diet.    INCLUDE A VARIETY OF FIBER SOURCES  Replace refined and processed grains with whole grains, canned fruits with fresh fruits, and incorporate other fiber sources. White rice, white breads, and most bakery goods contain little or no fiber.   Brown whole-grain rice, buckwheat oats, and many fruits and vegetables are all good sources of fiber. These include: broccoli, Brussels sprouts, cabbage, cauliflower, beets, sweet potatoes, white potatoes (skin on), carrots, tomatoes, eggplant, squash, berries, fresh fruits, and dried fruits.   Cereals appear to be the richest source of fiber. Cereal fiber is found in whole grains and bran. Bran is the fiber-rich outer coat of cereal grain, which is largely removed in refining. In whole-grain cereals, the bran remains. In breakfast cereals, the largest amount of fiber is found in those with "bran" in their names. The fiber content is sometimes indicated on the label.   You may need to include additional fruits and vegetables each day.   In baking, for 1 cup white flour, you may use the following substitutions:   1 cup whole-wheat flour minus 2 tablespoons.   1/2 cup white flour plus 1/2 cup whole-wheat flour.

## 2011-12-20 ENCOUNTER — Encounter: Payer: Self-pay | Admitting: Gastroenterology

## 2011-12-21 ENCOUNTER — Encounter: Payer: Self-pay | Admitting: Urgent Care

## 2011-12-21 ENCOUNTER — Ambulatory Visit (INDEPENDENT_AMBULATORY_CARE_PROVIDER_SITE_OTHER): Payer: Self-pay | Admitting: Urgent Care

## 2011-12-21 VITALS — BP 147/81 | HR 89 | Temp 98.2°F | Ht 74.0 in | Wt 203.2 lb

## 2011-12-21 DIAGNOSIS — K648 Other hemorrhoids: Secondary | ICD-10-CM

## 2011-12-21 NOTE — Patient Instructions (Addendum)
Flexible sigmoidoscopy with Dr Darrick Penna with possible hemorrhoid banding Hemorrhoid Banding Hemorrhoids are veins in the anus and lower rectum that become enlarged. The most common symptoms are rectal bleeding, itching, and sometimes pain. Hemorrhoids might come out with straining or having a bowel movement, and they can sometimes be pushed back in. There are internal and external hemorrhoids. Only internal hemorrhoids can be treated with banding. In this procedure, a rubber band is placed near the hemorrhoid tissue, cutting off the blood supply. This procedure prevents the hemorrhoids from slipping down. LET YOUR CAREGIVER KNOW ABOUT: All medicines you are taking, especially blood thinners such as aspirin and coumadin.  RISKS AND COMPLICATIONS This is not a painful procedure, but if you do have intense pain immediately let your surgeon know because the band may need to be removed. You may have some mild pain or discomfort in the first 2 days or so after treatment. Sometimes there may be delayed bleeding in the first week after treatment.  BEFORE THE PROCEDURE  There is no special preparation needed before banding. Your surgeon may have you do an enema prior to the procedure. You will go home the same day.  HOME CARE INSTRUCTIONS   Your surgeon might instruct you to do sitz baths as needed if you have discomfort or after a bowel movement.  You may be instructed to use fiber supplements. SEEK MEDICAL CARE IF:  You have an increase in pain.  Your pain does not get better. SEEK IMMEDIATE MEDICAL CARE IF:  You have intense pain.  Fever greater than 100.5 F (38.1 C).  Bleeding that does not stop, or pus from the anus. Document Released: 12/25/2008 Document Revised: 05/22/2011 Document Reviewed: 12/25/2008 [facility] Patient Information 2013 [place], [place].

## 2011-12-21 NOTE — Progress Notes (Signed)
Primary Care Physician:  Syliva Overman, MD Primary Gastroenterologist:  Dr. Jonette Eva  Chief Complaint  Patient presents with  . Follow-up    hemorrhoids    HPI:  Tristan Cook is a 51 y.o. male here for follow-up hemorrhoids.  He has failed medical management.  Her says the suppositories made burning worse.  He has tried multiple treatments since last colonoscopy where he was found to have bleeding internal hemorrhoids.  He is quite frustrated as he is still having small-moderate hematochezia, proctalgia & pruritis.   Denies constipation, diarrhea, or weight loss.  Denies abdominal pain.     Past Medical History  Diagnosis Date  . Hypertension   . High cholesterol   . Substance abuse     h/o excessive alcohol use; pt says he limits use to one to 2 beers daily he currently  . Neck pain   . Back pain   . Abdominal pain   . Adenomatous colon polyp 2008  . Constipation   . Gout   . Hemorrhoids     Past Surgical History  Procedure Date  . Hand surgery   . Colonoscopy 10/09/2006    3 mm pedunculated sigmoid colon polyp removed/8-mm sessile hepatic flexure polyp (tubular villous adenoma) removed/6-mm  descending colon polyp removed/small internal hemorrhoids  . Colonoscopy 02/28/2011    Polyps, multiple in the rectum/Internal hemorrhoids, MODERATE-CAUSING RECTAL BLEEDING    Current Outpatient Prescriptions  Medication Sig Dispense Refill  . lisinopril-hydrochlorothiazide (PRINZIDE,ZESTORETIC) 10-12.5 MG per tablet Take 0.5 tablets by mouth daily.         Allergies as of 12/21/2011  . (No Known Allergies)    Family History  Problem Relation Age of Onset  . Hypertension Mother   . Hypertension Father   . Hypertension Sister   . Hypertension Brother   . Colon cancer Neg Hx     History   Social History  . Marital Status: Legally Separated    Spouse Name: N/A    Number of Children: 0  . Years of Education: N/A   Occupational History  . unemployed, Art gallery manager    Social History Main Topics  . Smoking status: Former Smoker -- 1.0 packs/day for 23 years    Types: Cigarettes    Quit date: 03/13/2004  . Smokeless tobacco: Not on file  . Alcohol Use: 4.8 oz/week    8 Cans of beer per week  . Drug Use: No  . Sexually Active: Not on file   Other Topics Concern  . Not on file   Social History Narrative  . No narrative on file    Review of Systems: See HPI, otherwise negative complete ROS  Physical Exam: BP 147/81  Pulse 89  Temp 98.2 F (36.8 C) (Temporal)  Ht 6\' 2"  (1.88 m)  Wt 203 lb 3.2 oz (92.171 kg)  BMI 26.09 kg/m2 No LMP for male patient. General:   Alert,  Well-developed, well-nourished, pleasant and cooperative in NAD Head:  Normocephalic and atraumatic. Eyes:  Sclera clear, no icterus.   Conjunctiva pink. Ears:  Normal auditory acuity. Nose:  No deformity, discharge, or lesions. Mouth:  No deformity or lesions,oropharynx pink & moist. Neck:  Supple; no masses or thyromegaly. Lungs:  Clear throughout to auscultation.   No wheezes, crackles, or rhonchi. No acute distress. Heart:  Regular rate and rhythm; no murmurs, clicks, rubs,  or gallops. Abdomen:  Normal bowel sounds.  No bruits.  Soft, non-tender and non-distended without masses, hepatosplenomegaly or hernias noted.  No guarding or rebound tenderness.   Rectal:  Deferred until FS Msk:  Symmetrical without gross deformities. Normal posture. Pulses:  Normal pulses noted. Extremities:  No clubbing or edema. Neurologic:  Alert and  oriented x4;  grossly normal neurologically. Skin:  Intact without significant lesions or rashes. Lymph Nodes:  No significant cervical adenopathy. Psych:  Alert and cooperative. Normal mood and affect.

## 2011-12-21 NOTE — Progress Notes (Signed)
Faxed to PCP

## 2011-12-21 NOTE — Assessment & Plan Note (Signed)
Tristan Cook is a pleasant 51 y.o. male with chronic internal bleeding hemorrhoids who has failed medical management.  Flexible sigmoidoscopy with banding with Dr Darrick Penna will be arranged.  I have discussed risks & benefits which include, but are not limited to, bleeding, infection, perforation, drug reaction & hemorrhoid management failure.  The patient agrees with this plan & written consent will be obtained.    Banding literature provided

## 2011-12-25 ENCOUNTER — Encounter (HOSPITAL_COMMUNITY): Payer: Self-pay

## 2012-01-01 MED ORDER — SODIUM CHLORIDE 0.45 % IV SOLN
INTRAVENOUS | Status: DC
  Administered 2012-01-02: 10:00:00 via INTRAVENOUS

## 2012-01-02 ENCOUNTER — Ambulatory Visit (HOSPITAL_COMMUNITY)
Admission: RE | Admit: 2012-01-02 | Discharge: 2012-01-02 | Disposition: A | Discharge status: Home / Self Care | Payer: Self-pay | Source: Ambulatory Visit | Attending: Gastroenterology | Admitting: Gastroenterology

## 2012-01-02 ENCOUNTER — Encounter (HOSPITAL_COMMUNITY): Payer: Self-pay | Admitting: *Deleted

## 2012-01-02 ENCOUNTER — Encounter (HOSPITAL_COMMUNITY)
Admission: RE | Disposition: A | Discharge status: Home / Self Care | Payer: Self-pay | Source: Ambulatory Visit | Attending: Gastroenterology

## 2012-01-02 DIAGNOSIS — K621 Rectal polyp: Secondary | ICD-10-CM

## 2012-01-02 DIAGNOSIS — K648 Other hemorrhoids: Secondary | ICD-10-CM | POA: Insufficient documentation

## 2012-01-02 DIAGNOSIS — K6289 Other specified diseases of anus and rectum: Secondary | ICD-10-CM | POA: Insufficient documentation

## 2012-01-02 DIAGNOSIS — I10 Essential (primary) hypertension: Secondary | ICD-10-CM | POA: Insufficient documentation

## 2012-01-02 DIAGNOSIS — K625 Hemorrhage of anus and rectum: Secondary | ICD-10-CM | POA: Insufficient documentation

## 2012-01-02 DIAGNOSIS — K620 Anal polyp: Secondary | ICD-10-CM

## 2012-01-02 HISTORY — PX: FLEXIBLE SIGMOIDOSCOPY: SHX5431

## 2012-01-02 SURGERY — SIGMOIDOSCOPY, FLEXIBLE
Anesthesia: Moderate Sedation

## 2012-01-02 MED ORDER — MIDAZOLAM HCL 5 MG/5ML IJ SOLN
INTRAMUSCULAR | Status: DC | PRN
  Administered 2012-01-02: 2 mg via INTRAVENOUS

## 2012-01-02 MED ORDER — MIDAZOLAM HCL 5 MG/5ML IJ SOLN
INTRAMUSCULAR | Status: AC
  Filled 2012-01-02: qty 5

## 2012-01-02 MED ORDER — STERILE WATER FOR IRRIGATION IR SOLN
Status: DC | PRN
  Administered 2012-01-02: 11:00:00

## 2012-01-02 MED ORDER — MEPERIDINE HCL 100 MG/ML IJ SOLN
INTRAMUSCULAR | Status: AC
  Filled 2012-01-02: qty 1

## 2012-01-02 MED ORDER — MEPERIDINE HCL 100 MG/ML IJ SOLN
INTRAMUSCULAR | Status: DC | PRN
  Administered 2012-01-02: 25 mg via INTRAVENOUS

## 2012-01-02 NOTE — Op Note (Signed)
[facility] [address] [place], [address]   FLEXIBLE SIGMOIDOSCOPY PROCEDURE REPORT  PATIENT: Tristan Cook, Tristan Cook  MR#: [id number] BIRTHDATE: [birth year] , 51  yrs. old GENDER: Male ENDOSCOPIST: Jonette Eva, MD REFERRED BY: Syliva Overman, M.D. PROCEDURE DATE:  01/02/2012 PROCEDURE:   Sigmoidoscopy, screening and Hemorrhoidectomy via banding, clips or ligation ASA CLASS: INDICATIONS:rectal pain/bleeding-PMHx: INTERNAL HEMORRHOIDS. MEDICATIONS: Demerol 25 mg IV and Versed 2 mg IV  DESCRIPTION OF PROCEDURE:   After the risks benefits and alternatives of the procedure were thoroughly explained, informed consent was obtained.  revealed internal hemorrhoids. The endoscope was introduced through the anus  and advanced to the sigmoid colon , limited by No adverse events experienced.   The quality of the prep was good .  The instrument was then slowly withdrawn as the mucosa was fully examined.     1.  COLON FINDINGS: The colonic mucosa appeared normal in the sigmoid colon. 2.  Large internal hemorrhoids were found.   Retroflexed views revealed internal hemorrhoid.    The scope was then withdrawn & THE BANDING DEVICE APPLIED. 3 BANDS WERE APPLIED. PT EXPERIENCED NO PAIN PRIO TO RELEASING THE BANDS. The scope was then withdrawn from the patient and the procedure terminated.  COMPLICATIONS: There were no complications.  ENDOSCOPIC IMPRESSION: 1.   The colonic mucosa appeared normal in the sigmoid colon 2.   Large internal hemorrhoids: CAUSE FOR RECTAL BLEEDING/PAIN  RECOMMENDATIONS: CALL FOR RECTAL BLEEDING, PAIN, FEVER, OR DIFFICULTY URINATING LOW RESIDUE DIET COLACE TO SOFTEN STOOL OPV NOV 7   REPEAT EXAM:   _______________________________ Rosalie DoctorJonette Eva, MD 01/02/2012 12:50 PM   CC:

## 2012-01-02 NOTE — H&P (Signed)
.   Primary Care Physician:  Syliva Overman, MD Primary Gastroenterologist:  Dr. Darrick Penna  Pre-Procedure History & Physical: HPI:  Tristan Cook is a 51 y.o. male here for RECTAL BLEEDING/pain.  Past Medical History  Diagnosis Date  . Hypertension   . High cholesterol   . Substance abuse     h/o excessive alcohol use; pt says he limits use to one to 2 beers daily he currently  . Neck pain   . Back pain   . Abdominal pain   . Adenomatous colon polyp 2008  . Constipation   . Gout   . Hemorrhoids     Past Surgical History  Procedure Date  . Hand surgery   . Colonoscopy 10/09/2006    3 mm pedunculated sigmoid colon polyp removed/8-mm sessile hepatic flexure polyp (tubular villous adenoma) removed/6-mm  descending colon polyp removed/small internal hemorrhoids  . Colonoscopy 02/28/2011    Polyps, multiple in the rectum/Internal hemorrhoids, MODERATE-CAUSING RECTAL BLEEDING    Prior to Admission medications   Medication Sig Start Date End Date Taking? Authorizing Provider  lisinopril-hydrochlorothiazide (PRINZIDE,ZESTORETIC) 10-12.5 MG per tablet Take 0.5 tablets by mouth daily.  07/11/11 07/10/12 Yes Kerri Perches, MD    Allergies as of 12/21/2011  . (No Known Allergies)    Family History  Problem Relation Age of Onset  . Hypertension Mother   . Hypertension Father   . Hypertension Sister   . Hypertension Brother   . Colon cancer Neg Hx     History   Social History  . Marital Status: Legally Separated    Spouse Name: N/A    Number of Children: 0  . Years of Education: N/A   Occupational History  . unemployed, Civil engineer, contracting    Social History Main Topics  . Smoking status: Former Smoker -- 1.0 packs/day for 23 years    Types: Cigarettes    Quit date: 03/13/2004  . Smokeless tobacco: Not on file  . Alcohol Use: 4.8 oz/week    8 Cans of beer per week  . Drug Use: No  . Sexually Active: Not on file   Other Topics Concern  . Not on file   Social  History Narrative  . No narrative on file    Review of Systems: See HPI, otherwise negative ROS   Physical Exam: BP 134/89  Temp 98.5 F (36.9 C) (Oral)  Resp 18  SpO2 100% General:   Alert,  pleasant and cooperative in NAD Head:  Normocephalic and atraumatic. Neck:  Supple; Lungs:  Clear throughout to auscultation.    Heart:  Regular rate and rhythm. Abdomen:  Soft, nontender and nondistended. Normal bowel sounds, without guarding, and without rebound.   Neurologic:  Alert and  oriented x4;  grossly normal neurologically.  Impression/Plan:     RECTAL BLEEDING/pain  PLAN:  1. FLEX SIG-BANDING TODAY

## 2012-01-09 ENCOUNTER — Encounter (HOSPITAL_COMMUNITY): Payer: Self-pay | Admitting: Gastroenterology

## 2012-01-17 ENCOUNTER — Encounter: Payer: Self-pay | Admitting: Gastroenterology

## 2012-01-18 ENCOUNTER — Ambulatory Visit (INDEPENDENT_AMBULATORY_CARE_PROVIDER_SITE_OTHER): Payer: Self-pay | Admitting: Gastroenterology

## 2012-01-18 ENCOUNTER — Encounter: Payer: Self-pay | Admitting: Gastroenterology

## 2012-01-18 VITALS — BP 132/78 | HR 105 | Temp 97.4°F | Ht 74.0 in | Wt 202.6 lb

## 2012-01-18 DIAGNOSIS — K648 Other hemorrhoids: Secondary | ICD-10-CM

## 2012-01-18 NOTE — Assessment & Plan Note (Signed)
IMPROVED AFTER TCS/BANDING  HIGH FIBER DIET DISCUSSED LIFESTYLE MANAGEMENT OF HEMORRHOIDS STOOL SOFTENER DAILY RECTAL CREAM PRN OPV IN 6 MOS

## 2012-01-18 NOTE — Progress Notes (Signed)
  Subjective:    Patient ID: Tristan Cook, male    DOB: December 28, 1960, 51 y.o.   MRN: [id number]  PCP: [facility]  HPI Still has a little itching. No bleeding or pain. Gets out of the shower and uses cream to stop the itching. BmS: NL. STILL ON A MODIFIED DIET. NO NAUSEA, VOMITING, OR ABD PAIN.   Past Medical History  Diagnosis Date  . Hypertension   . High cholesterol   . Substance abuse     h/o excessive alcohol use; pt says he limits use to one to 2 beers daily he currently  . Neck pain   . Back pain   . Abdominal pain   . Adenomatous colon polyp 2008  . Constipation   . Gout   . Hemorrhoids     Past Surgical History  Procedure Date  . Hand surgery   . Colonoscopy 10/09/2006    3 mm pedunculated sigmoid colon polyp removed/8-mm sessile hepatic flexure polyp (tubular villous adenoma) removed/6-mm  descending colon polyp removed/small internal hemorrhoids  . Colonoscopy 02/28/2011    WUJ:WJXBJY, multiple in the rectum/Internal hemorrhoids, MODERATE-CAUSING RECTAL BLEEDING  . Flexible sigmoidoscopy 01/02/2012    Procedure: FLEXIBLE SIGMOIDOSCOPY;  Surgeon: West Bali, MD;  Location: [facility];  Service: Endoscopy;  Laterality: N/A;      No Known Allergies  Current Outpatient Prescriptions  Medication Sig Dispense Refill  . lisinopril-hydrochlorothiazide (PRINZIDE,ZESTORETIC) 10-12.5 MG per tablet Take 0.5 tablets by mouth daily.        OTC STOOL SOFTENER ONCE A DAY.    Review of Systems     Objective:   Physical Exam  Vitals reviewed. Constitutional: He is oriented to person, place, and time. He appears well-nourished. No distress.  HENT:  Head: Normocephalic and atraumatic.  Cardiovascular: Normal rate, regular rhythm and normal heart sounds.   Pulmonary/Chest: Breath sounds normal. No respiratory distress.  Abdominal: Soft. Bowel sounds are normal. He exhibits no distension. There is no tenderness.  Neurological: He is alert and oriented to person, place,  and time.       NO FOCAL DEFICITS   Psychiatric: He has a normal mood and affect.          Assessment & Plan:

## 2012-01-18 NOTE — Progress Notes (Signed)
Faxed to PCP

## 2012-01-18 NOTE — Patient Instructions (Signed)
DRINK WATER.  FOLLOW A HIGH FIBER DIET. SEE INFO BELOW.  USE STOOL SOFTENER DAILY  FOLLOW UP IN 6 MOS.   Hemorrhoids Hemorrhoids are dilated (enlarged) veins around the rectum. Sometimes clots will form in the veins. This makes them swollen and painful. These are called thrombosed hemorrhoids.  Causes of hemorrhoids include:    Constipation.   Straining to have a bowel movement.      HOME CARE INSTRUCTIONS  Eat a well balanced diet and drink 6 to 8 glasses of water every day to avoid constipation. You may also use a bulk laxative.   Avoid straining to have bowel movements.   Keep anal area dry and clean.   If thrombosed:  Take hot sitz baths for 20 to 30 minutes, 3 to 4 times per day.   If the hemorrhoids are very tender and swollen, place ice packs on area as tolerated. Using ice packs between sitz baths may be helpful. Fill a plastic bag with ice and use a towel between the bag of ice and your skin.   Do not use a donut shaped pillow or sit on the toilet for long periods. This increases blood pooling and pain.   Move your bowels when your body has the urge; this will require less straining and will decrease pain and pressure.    High-Fiber Diet A high-fiber diet changes your normal diet to include more whole grains, legumes, fruits, and vegetables. Changes in the diet involve replacing refined carbohydrates with unrefined foods. The calorie level of the diet is essentially unchanged. The Dietary Reference Intake (recommended amount) for adult males is 38 grams per day. For adult females, it is 25 grams per day. Pregnant and lactating women should consume 28 grams of fiber per day. Fiber is the intact part of a plant that is not broken down during digestion. Functional fiber is fiber that has been isolated from the plant to provide a beneficial effect in the body. PURPOSE  Increase stool bulk.   Ease and regulate bowel movements.   Lower cholesterol.  INDICATIONS  THAT YOU NEED MORE FIBER  Constipation and hemorrhoids.   Uncomplicated diverticulosis (intestine condition) and irritable bowel syndrome.   Weight management.   As a protective measure against hardening of the arteries (atherosclerosis), diabetes, and cancer.   GUIDELINES FOR INCREASING FIBER IN THE DIET  Start adding fiber to the diet slowly. A gradual increase of about 5 more grams (2 slices of whole-wheat bread, 2 servings of most fruits or vegetables, or 1 bowl of high-fiber cereal) per day is best. Too rapid an increase in fiber may result in constipation, flatulence, and bloating.   Drink enough water and fluids to keep your urine clear or pale yellow. Water, juice, or caffeine-free drinks are recommended. Not drinking enough fluid may cause constipation.   Eat a variety of high-fiber foods rather than one type of fiber.   Try to increase your intake of fiber through using high-fiber foods rather than fiber pills or supplements that contain small amounts of fiber.   The goal is to change the types of food eaten. Do not supplement your present diet with high-fiber foods, but replace foods in your present diet.  INCLUDE A VARIETY OF FIBER SOURCES  Replace refined and processed grains with whole grains, canned fruits with fresh fruits, and incorporate other fiber sources. White rice, white breads, and most bakery goods contain little or no fiber.   Brown whole-grain rice, buckwheat oats, and many fruits and  vegetables are all good sources of fiber. These include: broccoli, Brussels sprouts, cabbage, cauliflower, beets, sweet potatoes, white potatoes (skin on), carrots, tomatoes, eggplant, squash, berries, fresh fruits, and dried fruits.   Cereals appear to be the richest source of fiber. Cereal fiber is found in whole grains and bran. Bran is the fiber-rich outer coat of cereal grain, which is largely removed in refining. In whole-grain cereals, the bran remains. In breakfast cereals,  the largest amount of fiber is found in those with "bran" in their names. The fiber content is sometimes indicated on the label.   You may need to include additional fruits and vegetables each day.   In baking, for 1 cup white flour, you may use the following substitutions:   1 cup whole-wheat flour minus 2 tablespoons.   1/2 cup white flour plus 1/2 cup whole-wheat flour.

## 2012-01-20 NOTE — Progress Notes (Signed)
REVIEWED.   S/P HEMORRHOID BANDING OCT 2013

## 2012-01-31 ENCOUNTER — Telehealth: Payer: Self-pay

## 2012-01-31 ENCOUNTER — Telehealth: Payer: Self-pay | Admitting: *Deleted

## 2012-01-31 NOTE — Telephone Encounter (Signed)
Called pt. He had a large spot of blood in stool x 1 this AM. He is taking stool softner daily and using the rectal cream.Please advise!

## 2012-01-31 NOTE — Telephone Encounter (Signed)
Opened in error

## 2012-01-31 NOTE — Telephone Encounter (Signed)
Tristan Cook called today. He had a little bit of blood in his stool this am and would like to speak with someone about it. Thanks.

## 2012-02-01 NOTE — Telephone Encounter (Signed)
He has large hemorrhoids that are likely culprit of bleeding.   Hopefully this will resolve with anusol & supportive measures & banding, but if he has persistent or heavy bleeding, he should let Dr Darrick Penna know.

## 2012-02-01 NOTE — Telephone Encounter (Signed)
Routing to Lorenza Burton, NP in Dr. Darrick Penna absence today.

## 2012-02-01 NOTE — Telephone Encounter (Signed)
Called and informed pt. He said he did not have any this AM, he will call if he has problems.

## 2012-02-02 NOTE — Telephone Encounter (Signed)
REVIEWED.  

## 2012-02-05 ENCOUNTER — Other Ambulatory Visit: Payer: Self-pay

## 2012-02-05 ENCOUNTER — Telehealth: Payer: Self-pay | Admitting: Family Medicine

## 2012-02-05 MED ORDER — LISINOPRIL-HYDROCHLOROTHIAZIDE 10-12.5 MG PO TABS: 0.5000 | ORAL_TABLET | Freq: Every day | ORAL | Status: DC

## 2012-02-05 NOTE — Telephone Encounter (Signed)
Med reordered.

## 2012-02-06 NOTE — Progress Notes (Signed)
Reminder in epic to follow up in 6 months °

## 2012-02-20 ENCOUNTER — Ambulatory Visit: Payer: Self-pay | Admitting: Family Medicine

## 2012-03-18 ENCOUNTER — Encounter (HOSPITAL_COMMUNITY): Payer: Self-pay

## 2012-03-18 ENCOUNTER — Emergency Department (HOSPITAL_COMMUNITY)
Admission: EM | Admit: 2012-03-18 | Discharge: 2012-03-18 | Disposition: A | Discharge status: Home / Self Care | Payer: Self-pay | Attending: Emergency Medicine | Admitting: Emergency Medicine

## 2012-03-18 DIAGNOSIS — Z8639 Personal history of other endocrine, nutritional and metabolic disease: Secondary | ICD-10-CM | POA: Insufficient documentation

## 2012-03-18 DIAGNOSIS — Z862 Personal history of diseases of the blood and blood-forming organs and certain disorders involving the immune mechanism: Secondary | ICD-10-CM | POA: Insufficient documentation

## 2012-03-18 DIAGNOSIS — I10 Essential (primary) hypertension: Secondary | ICD-10-CM | POA: Insufficient documentation

## 2012-03-18 DIAGNOSIS — E7800 Pure hypercholesterolemia, unspecified: Secondary | ICD-10-CM | POA: Insufficient documentation

## 2012-03-18 DIAGNOSIS — M25469 Effusion, unspecified knee: Secondary | ICD-10-CM | POA: Insufficient documentation

## 2012-03-18 DIAGNOSIS — M25561 Pain in right knee: Secondary | ICD-10-CM

## 2012-03-18 DIAGNOSIS — Z87891 Personal history of nicotine dependence: Secondary | ICD-10-CM | POA: Insufficient documentation

## 2012-03-18 DIAGNOSIS — M25569 Pain in unspecified knee: Secondary | ICD-10-CM | POA: Insufficient documentation

## 2012-03-18 DIAGNOSIS — Z8719 Personal history of other diseases of the digestive system: Secondary | ICD-10-CM | POA: Insufficient documentation

## 2012-03-18 DIAGNOSIS — Z8739 Personal history of other diseases of the musculoskeletal system and connective tissue: Secondary | ICD-10-CM | POA: Insufficient documentation

## 2012-03-18 DIAGNOSIS — Z79899 Other long term (current) drug therapy: Secondary | ICD-10-CM | POA: Insufficient documentation

## 2012-03-18 DIAGNOSIS — M25562 Pain in left knee: Secondary | ICD-10-CM

## 2012-03-18 DIAGNOSIS — F1010 Alcohol abuse, uncomplicated: Secondary | ICD-10-CM | POA: Insufficient documentation

## 2012-03-18 NOTE — ED Notes (Signed)
Pt reports pain and swelling to knees bilaterally for 3 weeks.  Pt denies any injury.  Pt reports hx of gout.

## 2012-03-18 NOTE — ED Provider Notes (Signed)
History   This chart was scribed for Joya Gaskins, MD by Charolett Bumpers, ED Scribe. The patient was seen in room [facility]03/[facility]03. Patient's care was started at 0702.   CSN: [id number]  Arrival date & time 03/18/12  4098   First MD Initiated Contact with Patient 03/18/12 [phone]      Chief Complaint  Patient presents with  . Knee Pain    The history is provided by the patient. No language interpreter was used.   Tristan Cook is a 52 y.o. male who presents to the Emergency Department complaining of constant, moderate bilateral knee pain that started 3 weeks ago. He states the pain started 4 days after he started working 3 weeks ago. He reports associated swelling and weakness in his legs. He states he does a lot of walking and pulling at his job. He denies any fall or injuries. He denies any fevers, vomiting or redness to his knees. He states that he has similar symptoms in the past but never this severe. He has a h/o HTN and gout but states this does not feel like his gout which is normally located in his ankles. He has taken Aleve without relief.   No trauma reported  PCP: Dr. Lodema Hong  Past Medical History  Diagnosis Date  . Hypertension   . High cholesterol   . Substance abuse     h/o excessive alcohol use; pt says he limits use to one to 2 beers daily he currently  . Neck pain   . Back pain   . Abdominal pain   . Adenomatous colon polyp 2008  . Constipation   . Gout   . Hemorrhoids     Past Surgical History  Procedure Date  . Hand surgery   . Colonoscopy 10/09/2006    3 mm pedunculated sigmoid colon polyp removed/8-mm sessile hepatic flexure polyp (tubular villous adenoma) removed/6-mm  descending colon polyp removed/small internal hemorrhoids  . Colonoscopy 02/28/2011    YNW:[id number], multiple in the rectum/Internal hemorrhoids, MODERATE-CAUSING RECTAL BLEEDING  . Flexible sigmoidoscopy 01/02/2012    Procedure: FLEXIBLE SIGMOIDOSCOPY;  Surgeon: West Bali, MD;   Location: [facility];  Service: Endoscopy;  Laterality: N/A;  11:30AM-changed to 10:15 Soledad Gerlach notified pt./WITH POSS BANDING    Family History  Problem Relation Age of Onset  . Hypertension Mother   . Hypertension Father   . Hypertension Sister   . Hypertension Brother   . Colon cancer Neg Hx     History  Substance Use Topics  . Smoking status: Former Smoker -- 1.0 packs/day for 23 years    Types: Cigarettes    Quit date: 03/13/2004  . Smokeless tobacco: Not on file  . Alcohol Use: 4.8 oz/week    8 Cans of beer per week      Review of Systems  Constitutional: Negative for fever and chills.  Gastrointestinal: Negative for vomiting and diarrhea.  Musculoskeletal: Positive for joint swelling and arthralgias.  Skin: Negative for color change.  Neurological: Positive for weakness.    Allergies  Review of patient's allergies indicates no known allergies.  Home Medications   Current Outpatient Rx  Name  Route  Sig  Dispense  Refill  . LISINOPRIL-HYDROCHLOROTHIAZIDE 10-12.5 MG PO TABS   Oral   Take 0.5 tablets by mouth daily.   15 tablet   1     BP 116/79  Pulse 92  Temp 98.2 F (36.8 C) (Oral)  Resp 16  SpO2 99%  Physical Exam  CONSTITUTIONAL: Well developed/well nourished HEAD AND FACE: Normocephalic/atraumatic EYES: EOMI ENMT: Mucous membranes moist NECK: supple no meningeal signs CV: S1/S2 noted, no murmurs/rubs/gallops noted LUNGS: Lungs are clear to auscultation bilaterally, no apparent distress ABDOMEN: soft, nontender, no rebound or guarding GU:no cva tenderness NEURO: Pt is awake/alert, moves all extremitiesx4. No focal motor deficits noted in his lower extremities.  He is ambulatory EXTREMITIES: pulses normal, No edema or erythema to the knees, full ROM and no deformity.   He does have mild crepitus to each knee with ROM SKIN: warm, color normal PSYCH: no abnormalities of mood noted  ED Course  Procedures   DIAGNOSTIC STUDIES: Oxygen  Saturation is 99% on room air, normal by my interpretation.    COORDINATION OF CARE:  07:17-Discussed planned course of treatment with the patient including d/c home and f/u with orthopedics, who is agreeable at this time.  Advised to continue his home aleve which he just started.  Suspect he will need ortho referral as outpatient.  Do not feel imaging is warranted at this time     MDM  Nursing notes including past medical history and social history reviewed and considered in documentation     I personally performed the services described in this documentation, which was scribed in my presence. The recorded information has been reviewed and is accurate.        Joya Gaskins, MD 03/18/12 [phone]

## 2012-03-22 ENCOUNTER — Telehealth: Payer: Self-pay | Admitting: Family Medicine

## 2012-03-22 MED ORDER — LISINOPRIL-HYDROCHLOROTHIAZIDE 10-12.5 MG PO TABS: 0.5000 | ORAL_TABLET | Freq: Every day | ORAL | Status: DC

## 2012-03-22 NOTE — Telephone Encounter (Signed)
30 days sent

## 2012-04-08 ENCOUNTER — Other Ambulatory Visit (HOSPITAL_COMMUNITY): Payer: Self-pay | Admitting: Orthopaedic Surgery

## 2012-04-08 DIAGNOSIS — M25561 Pain in right knee: Secondary | ICD-10-CM

## 2012-04-10 ENCOUNTER — Ambulatory Visit (HOSPITAL_COMMUNITY)
Admission: RE | Admit: 2012-04-10 | Discharge: 2012-04-10 | Disposition: A | Discharge status: Home / Self Care | Payer: BC Managed Care – PPO | Source: Ambulatory Visit | Attending: Orthopaedic Surgery | Admitting: Orthopaedic Surgery

## 2012-04-10 ENCOUNTER — Encounter (HOSPITAL_COMMUNITY): Payer: Self-pay

## 2012-04-10 DIAGNOSIS — M25569 Pain in unspecified knee: Secondary | ICD-10-CM | POA: Insufficient documentation

## 2012-04-10 DIAGNOSIS — R937 Abnormal findings on diagnostic imaging of other parts of musculoskeletal system: Secondary | ICD-10-CM | POA: Insufficient documentation

## 2012-04-10 DIAGNOSIS — M25561 Pain in right knee: Secondary | ICD-10-CM

## 2012-04-10 DIAGNOSIS — M25469 Effusion, unspecified knee: Secondary | ICD-10-CM | POA: Insufficient documentation

## 2012-04-23 ENCOUNTER — Ambulatory Visit (INDEPENDENT_AMBULATORY_CARE_PROVIDER_SITE_OTHER): Payer: BC Managed Care – PPO | Admitting: Family Medicine

## 2012-04-23 ENCOUNTER — Encounter: Payer: Self-pay | Admitting: Family Medicine

## 2012-04-23 VITALS — BP 114/82 | HR 80 | Resp 16 | Ht 74.0 in | Wt 202.0 lb

## 2012-04-23 DIAGNOSIS — M2240 Chondromalacia patellae, unspecified knee: Secondary | ICD-10-CM

## 2012-04-23 DIAGNOSIS — K219 Gastro-esophageal reflux disease without esophagitis: Secondary | ICD-10-CM | POA: Insufficient documentation

## 2012-04-23 DIAGNOSIS — I10 Essential (primary) hypertension: Secondary | ICD-10-CM

## 2012-04-23 DIAGNOSIS — E785 Hyperlipidemia, unspecified: Secondary | ICD-10-CM

## 2012-04-23 DIAGNOSIS — R05 Cough: Secondary | ICD-10-CM | POA: Insufficient documentation

## 2012-04-23 DIAGNOSIS — K648 Other hemorrhoids: Secondary | ICD-10-CM

## 2012-04-23 DIAGNOSIS — R0789 Other chest pain: Secondary | ICD-10-CM

## 2012-04-23 DIAGNOSIS — R059 Cough, unspecified: Secondary | ICD-10-CM

## 2012-04-23 MED ORDER — PANTOPRAZOLE SODIUM 20 MG PO TBEC: 20.0000 mg | DELAYED_RELEASE_TABLET | Freq: Every day | ORAL | Status: DC

## 2012-04-23 MED ORDER — LISINOPRIL-HYDROCHLOROTHIAZIDE 10-12.5 MG PO TABS: ORAL_TABLET | ORAL | Status: DC

## 2012-04-23 NOTE — Progress Notes (Signed)
  Subjective:    Patient ID: Tristan Cook, male    DOB: April 05, 1960, 52 y.o.   MRN: [id number]  HPI The PT is here for follow up and re-evaluation of chronic medical conditions, medication management and review of any available recent lab and radiology data.  Preventive health is updated, specifically  Cancer screening and Immunization.   Was working up to Jan 6, worked for 3 weeks, in a Air traffic controller, had acute swelling of both knees, had to leave the job, saw Dr Hilda Lias, job will not take him back though the swelling is down States he was working in [employer] for 3 days lifting containers over 50 pounds. He had banding of hemorhoids buy GI 2 in Fall of 2013,and was told not to lift over 50 pounds. States he has been lifting over 50 pounds, has had intermittent rectal bleeding despite being advised not to lift a certain weight. Plans to go back to school either culinary or servicing of computers. 3 month h/o non productive intermittent cough, no fever or chills , no sinus pressure or nasal drainage, concerned as he has a close family member recently diagnosed with lung cancer Feels depressed/ stresed ashe is unemployed, and job prospects are limited because of his health problems, however intends to get back in the job market by re training 1 month h/o left chest pain intermittently with and without activity, no associated nausea , diaphoresis or light headedness.No specific aggravating or relieving factors    Review of Systems See HPI Denies recent fever or chills. Denies sinus pressure, nasal congestion, ear pain or sore throat. Denies chest congestion, productive cough or wheezing. Denies PND, orhtopnea, palpitations and leg swelling Denies abdominal pain, nausea, vomiting,diarrhea or constipation.   Denies dysuria, frequency, hesitancy or incontinence. Denies headaches, seizures, numbness, or tingling.  Denies skin break down or rash.        Objective:   Physical  Exam  Patient alert and oriented and in no cardiopulmonary distress.  HEENT: No facial asymmetry, EOMI, no sinus tenderness,  oropharynx pink and moist.  Neck supple no adenopathy.  Chest: Clear to auscultation bilaterally.No reproducible chest wall pain  CVS: S1, S2 no murmurs, no S3. EKG: NSR, no ischemia ABD: Soft non tender. Bowel sounds normal.  Ext: No edema  MS: Adequate ROM spine, shoulders, hips and knees.  Skin: Intact, no ulcerations or rash noted.  Psych: Good eye contact, flat  affect. Memory intact  depressed appearing.  CNS: CN 2-12 intact, power, tone and sensation normal throughout.       Assessment & Plan:

## 2012-04-23 NOTE — Patient Instructions (Addendum)
F/u in 6 month, call iof you need me before.  Pls get CXR ordered.  EKG is normal  Fasting lipid and chem 7 as soon as possible  Continue hALF prinizide daily, though script states one , per your request

## 2012-04-25 NOTE — Assessment & Plan Note (Addendum)
States he has had banding last Fall with resriction on lifting of 50 pounds which limits his ability to work significantly, states he "tore up trhe paper" subsequently took up a job which involves lifting over the weight and he at times has rectal bleeding

## 2012-04-25 NOTE — Assessment & Plan Note (Signed)
New oncset cough, past h/o nicotine , family member recently diagnosed with lung cancer, pt concerned, CXR ordered

## 2012-04-25 NOTE — Assessment & Plan Note (Signed)
Controlled, no change in medication DASH diet and commitment to daily physical activity for a minimum of 30 minutes discussed and encouraged, as a part of hypertension management. The importance of attaining a healthy weight is also discussed. Pt actually controlled on half tablet daily, for economic reasons he requests script be written for 1 tab daily

## 2012-04-25 NOTE — Assessment & Plan Note (Signed)
Updated lab needed Hyperlipidemia:Low fat diet discussed and encouraged.   

## 2012-04-25 NOTE — Assessment & Plan Note (Signed)
Intermittent left chest pain x 1 month, office EKG, nSR no ischemia, normal

## 2012-04-25 NOTE — Assessment & Plan Note (Signed)
Reports acute knee pain and swelling which took him out of work recently, depressed as job prospects look poor

## 2012-04-29 ENCOUNTER — Other Ambulatory Visit: Payer: Self-pay | Admitting: Family Medicine

## 2012-04-29 ENCOUNTER — Ambulatory Visit (HOSPITAL_COMMUNITY)
Admit: 2012-04-29 | Discharge: 2012-04-29 | Disposition: A | Discharge status: Home / Self Care | Payer: BC Managed Care – PPO | Source: Ambulatory Visit | Attending: Family Medicine | Admitting: Family Medicine

## 2012-04-29 DIAGNOSIS — I10 Essential (primary) hypertension: Secondary | ICD-10-CM | POA: Insufficient documentation

## 2012-04-29 DIAGNOSIS — R059 Cough, unspecified: Secondary | ICD-10-CM | POA: Insufficient documentation

## 2012-04-29 DIAGNOSIS — R05 Cough: Secondary | ICD-10-CM | POA: Insufficient documentation

## 2012-04-29 LAB — LIPID PANEL
Cholesterol: 284 mg/dL — ABNORMAL HIGH (ref 0–200)
Total CHOL/HDL Ratio: 7.3 Ratio
Triglycerides: 631 mg/dL — ABNORMAL HIGH (ref <150)

## 2012-04-29 LAB — BASIC METABOLIC PANEL
BUN: 10 mg/dL (ref 6–23)
Chloride: 106 mEq/L (ref 96–112)
Creat: 1.26 mg/dL (ref 0.50–1.35)

## 2012-05-01 LAB — HEPATIC FUNCTION PANEL
ALT: 22 U/L (ref 0–53)
Bilirubin, Direct: 0.1 mg/dL (ref 0.0–0.3)

## 2012-05-01 LAB — LDL CHOLESTEROL, DIRECT: Direct LDL: 63 mg/dL

## 2012-05-06 ENCOUNTER — Other Ambulatory Visit: Payer: Self-pay | Admitting: Family Medicine

## 2012-05-10 ENCOUNTER — Other Ambulatory Visit: Payer: Self-pay

## 2012-05-10 MED ORDER — PRAVASTATIN SODIUM 20 MG PO TABS: 20.0000 mg | ORAL_TABLET | Freq: Every evening | ORAL | Status: DC

## 2012-05-22 ENCOUNTER — Encounter: Payer: Self-pay | Admitting: Family Medicine

## 2012-08-06 ENCOUNTER — Encounter: Payer: Self-pay | Admitting: Gastroenterology

## 2012-10-22 ENCOUNTER — Ambulatory Visit: Payer: BC Managed Care – PPO | Admitting: Family Medicine

## 2012-11-16 ENCOUNTER — Emergency Department (HOSPITAL_COMMUNITY)
Admission: EM | Admit: 2012-11-16 | Discharge: 2012-11-16 | Disposition: A | Discharge status: Home / Self Care | Payer: BC Managed Care – PPO | Attending: Emergency Medicine | Admitting: Emergency Medicine

## 2012-11-16 ENCOUNTER — Encounter (HOSPITAL_COMMUNITY): Payer: Self-pay | Admitting: Emergency Medicine

## 2012-11-16 DIAGNOSIS — Z87891 Personal history of nicotine dependence: Secondary | ICD-10-CM | POA: Insufficient documentation

## 2012-11-16 DIAGNOSIS — M109 Gout, unspecified: Secondary | ICD-10-CM

## 2012-11-16 DIAGNOSIS — E7800 Pure hypercholesterolemia, unspecified: Secondary | ICD-10-CM | POA: Insufficient documentation

## 2012-11-16 DIAGNOSIS — M25579 Pain in unspecified ankle and joints of unspecified foot: Secondary | ICD-10-CM | POA: Insufficient documentation

## 2012-11-16 DIAGNOSIS — M79672 Pain in left foot: Secondary | ICD-10-CM

## 2012-11-16 DIAGNOSIS — Z79899 Other long term (current) drug therapy: Secondary | ICD-10-CM | POA: Insufficient documentation

## 2012-11-16 DIAGNOSIS — Z8679 Personal history of other diseases of the circulatory system: Secondary | ICD-10-CM | POA: Insufficient documentation

## 2012-11-16 DIAGNOSIS — Z860100 Personal history of colon polyps, unspecified: Secondary | ICD-10-CM | POA: Insufficient documentation

## 2012-11-16 DIAGNOSIS — I10 Essential (primary) hypertension: Secondary | ICD-10-CM | POA: Insufficient documentation

## 2012-11-16 MED ORDER — INDOMETHACIN 25 MG PO CAPS: 25.0000 mg | ORAL_CAPSULE | Freq: Three times a day (TID) | ORAL | Status: DC | PRN

## 2012-11-16 MED ORDER — HYDROCODONE-ACETAMINOPHEN 5-325 MG PO TABS: 1.0000 | ORAL_TABLET | ORAL | Status: DC | PRN

## 2012-11-16 NOTE — ED Notes (Signed)
Pt c/o foot pain and swelling since this morning. Pt has hx of gout and states this feels similar to past episodes.

## 2012-11-16 NOTE — ED Provider Notes (Signed)
CSN: [id number]     Arrival date & time 11/16/12  2043 History   First MD Initiated Contact with Patient 11/16/12 2051     Chief Complaint  Patient presents with  . Foot Pain   (Consider location/radiation/quality/duration/timing/severity/associated sxs/prior Treatment) Patient is a 52 y.o. male presenting with lower extremity pain. The history is provided by the patient.  Foot Pain This is a new problem. The current episode started today. The problem occurs constantly. The problem has been gradually worsening. Pertinent negatives include no chest pain, chills, fever, headaches, nausea, neck pain or vomiting. He has tried nothing for the symptoms.  Tristan Cook is a 52 y.o. male who presents to the ED with left foot pain that started this morning. He states it is in the same area that his gout usually occurs and the pain is similar. He states that he has been eating a lot of foots that he shouldn't be that may have caused the flare up.   Past Medical History  Diagnosis Date  . Hypertension   . High cholesterol   . Substance abuse     h/o excessive alcohol use; pt says he limits use to one to 2 beers daily he currently  . Neck pain   . Back pain   . Abdominal pain   . Adenomatous colon polyp 2008  . Constipation   . Gout   . Hemorrhoids    Past Surgical History  Procedure Laterality Date  . Hand surgery    . Colonoscopy  10/09/2006    3 mm pedunculated sigmoid colon polyp removed/8-mm sessile hepatic flexure polyp (tubular villous adenoma) removed/6-mm  descending colon polyp removed/small internal hemorrhoids  . Colonoscopy  02/28/2011    WUJ:WJXBJY, multiple in the rectum/Internal hemorrhoids, MODERATE-CAUSING RECTAL BLEEDING  . Flexible sigmoidoscopy  01/02/2012    Procedure: FLEXIBLE SIGMOIDOSCOPY;  Surgeon: West Bali, MD;  Location: [facility];  Service: Endoscopy;  Laterality: N/A;  11:30AM-changed to 10:15 Soledad Gerlach notified pt./WITH POSS BANDING   Family History   Problem Relation Age of Onset  . Hypertension Mother   . Hypertension Father   . Hypertension Sister   . Hypertension Brother   . Colon cancer Neg Hx    History  Substance Use Topics  . Smoking status: Former Smoker -- 1.00 packs/day for 23 years    Types: Cigarettes    Quit date: 03/13/2004  . Smokeless tobacco: Not on file  . Alcohol Use: 4.8 oz/week    8 Cans of beer per week    Review of Systems  Constitutional: Negative for fever and chills.  HENT: Negative for neck pain.   Respiratory: Negative for shortness of breath.   Cardiovascular: Negative for chest pain.  Gastrointestinal: Negative for nausea and vomiting.  Musculoskeletal:       Left foot pain  Skin: Negative for wound.  Neurological: Negative for headaches.  Psychiatric/Behavioral: The patient is not nervous/anxious.     Allergies  Review of patient's allergies indicates no known allergies.  Home Medications   Current Outpatient Rx  Name  Route  Sig  Dispense  Refill  . diclofenac (VOLTAREN) 75 MG EC tablet   Oral   Take 75 mg by mouth 2 (two) times daily.         [name] docusate sodium (COLACE) 100 MG capsule   Oral   Take 100 mg by mouth 2 (two) times daily.         [name] HYDROcodone-acetaminophen (NORCO/VICODIN) 5-325 MG per  tablet   Oral   Take 1 tablet by mouth as needed for pain.         [name] lisinopril-hydrochlorothiazide (PRINZIDE,ZESTORETIC) 10-12.5 MG per tablet      One tablet once daily Dose increase effective 04/23/2012   30 tablet   5   . pantoprazole (PROTONIX) 20 MG tablet   Oral   Take 1 tablet (20 mg total) by mouth daily.   30 tablet   1   . pravastatin (PRAVACHOL) 20 MG tablet   Oral   Take 1 tablet (20 mg total) by mouth every evening.   90 tablet   1    BP 124/69  Pulse 96  Temp(Src) 98.2 F (36.8 C) (Oral)  Resp 16  SpO2 99% Physical Exam  Nursing note and vitals reviewed. Constitutional: He is oriented to person, place, and time. He appears well-developed  and well-nourished. No distress.  HENT:  Head: Normocephalic.  Eyes: EOM are normal.  Neck: Neck supple.  Cardiovascular: Normal rate.   Pulmonary/Chest: Effort normal.  Musculoskeletal:       Left foot: He exhibits tenderness and swelling. He exhibits normal range of motion.       Feet:  Pedal pulse strong, adequate circulation, good touch sensation. Tenderness on palpation and increased warmth.   Neurological: He is alert and oriented to person, place, and time. No cranial nerve deficit.  Skin: Skin is warm and dry.  Psychiatric: He has a normal mood and affect. His behavior is normal.    ED Course  Procedures Offered patient x=ray to see if stress fracture but he states this is his gout and he does not want x-ray.   MDM  52 y.o. male with pain on the dorsum of the left foot. Will treat with NSAIDS and pain medication. Patient to follow up with PCP. Patient stable for discharge home without any immediate complications. Discussed with the patient plan of care and all questioned fully answered. He will return if any problems arise.     Medication List    STOP taking these medications       diclofenac 75 MG EC tablet  Commonly known as:  VOLTAREN      TAKE these medications       HYDROcodone-acetaminophen 5-325 MG per tablet  Commonly known as:  NORCO/VICODIN  Take 1 tablet by mouth every 4 (four) hours as needed.     indomethacin 25 MG capsule  Commonly known as:  INDOCIN  Take 1 capsule (25 mg total) by mouth 3 (three) times daily as needed.      ASK your doctor about these medications       docusate sodium 100 MG capsule  Commonly known as:  COLACE  Take 100 mg by mouth 2 (two) times daily.     lisinopril-hydrochlorothiazide 10-12.5 MG per tablet  Commonly known as:  PRINZIDE,ZESTORETIC  - One tablet once daily  - Dose increase effective 04/23/2012           Janne Napoleon, NP 11/17/12 [phone]

## 2012-11-17 NOTE — ED Provider Notes (Signed)
Medical screening examination/treatment/procedure(s) were performed by non-physician practitioner and as supervising physician I was immediately available for consultation/collaboration.  Samanthamarie Ezzell, MD 11/17/12 1652 

## 2012-12-06 ENCOUNTER — Encounter (HOSPITAL_COMMUNITY): Payer: Self-pay

## 2012-12-06 ENCOUNTER — Emergency Department (HOSPITAL_COMMUNITY)
Admission: EM | Admit: 2012-12-06 | Discharge: 2012-12-06 | Disposition: A | Discharge status: Home / Self Care | Payer: BC Managed Care – PPO | Attending: Emergency Medicine | Admitting: Emergency Medicine

## 2012-12-06 DIAGNOSIS — I10 Essential (primary) hypertension: Secondary | ICD-10-CM | POA: Insufficient documentation

## 2012-12-06 DIAGNOSIS — Z8719 Personal history of other diseases of the digestive system: Secondary | ICD-10-CM | POA: Insufficient documentation

## 2012-12-06 DIAGNOSIS — Z79899 Other long term (current) drug therapy: Secondary | ICD-10-CM | POA: Insufficient documentation

## 2012-12-06 DIAGNOSIS — M79609 Pain in unspecified limb: Secondary | ICD-10-CM | POA: Insufficient documentation

## 2012-12-06 DIAGNOSIS — Z860100 Personal history of colon polyps, unspecified: Secondary | ICD-10-CM | POA: Insufficient documentation

## 2012-12-06 DIAGNOSIS — M79671 Pain in right foot: Secondary | ICD-10-CM

## 2012-12-06 DIAGNOSIS — Z87891 Personal history of nicotine dependence: Secondary | ICD-10-CM | POA: Insufficient documentation

## 2012-12-06 DIAGNOSIS — M109 Gout, unspecified: Secondary | ICD-10-CM | POA: Insufficient documentation

## 2012-12-06 MED ORDER — OXYCODONE-ACETAMINOPHEN 5-325 MG PO TABS
1.0000 | ORAL_TABLET | Freq: Once | ORAL | Status: AC
  Administered 2012-12-06: 1 via ORAL
  Filled 2012-12-06: qty 1

## 2012-12-06 MED ORDER — OXYCODONE-ACETAMINOPHEN 5-325 MG PO TABS: 1.0000 | ORAL_TABLET | ORAL | Status: DC | PRN

## 2012-12-06 MED ORDER — KETOROLAC TROMETHAMINE 60 MG/2ML IM SOLN
60.0000 mg | Freq: Once | INTRAMUSCULAR | Status: AC
  Administered 2012-12-06: 60 mg via INTRAMUSCULAR
  Filled 2012-12-06: qty 2

## 2012-12-06 MED ORDER — PREDNISONE 10 MG PO TABS: ORAL_TABLET | ORAL | Status: DC

## 2012-12-06 NOTE — ED Notes (Signed)
Pt denies injury or trauma but c/o pain and swelling to top of right foot x 1 week

## 2012-12-06 NOTE — ED Provider Notes (Signed)
CSN: [id number]     Arrival date & time 12/06/12  1853 History   First MD Initiated Contact with Patient 12/06/12 1921     Chief Complaint  Patient presents with  . Foot Pain   (Consider location/radiation/quality/duration/timing/severity/associated sxs/prior Treatment) HPI Comments: Tristan Cook is a 52 y.o. male who presents to the Emergency Department complaining of persistent pain to his right lateral foot.  He was seen here earlier this month for same and states the medication prescribed is not helping.  He reports hx of gout and states pain is similar to previous.  He also reports swelling and mild redness to the top of his foot.  He denies injury, numbness, open wounds, red streaks, fever, chills or pain proximal to his foot.  Patient is a 52 y.o. male presenting with lower extremity pain. The history is provided by the patient.  Foot Pain This is a recurrent problem. Episode onset: 2 weeks ago. The problem occurs constantly. The problem has been unchanged. Associated symptoms include arthralgias. Pertinent negatives include no abdominal pain, chills, congestion, fever, headaches, nausea, neck pain, numbness, rash, sore throat, swollen glands, vomiting or weakness. The symptoms are aggravated by standing and walking. He has tried oral narcotics (indocin) for the symptoms. The treatment provided no relief.    Past Medical History  Diagnosis Date  . Hypertension   . High cholesterol   . Substance abuse     h/o excessive alcohol use; pt says he limits use to one to 2 beers daily he currently  . Neck pain   . Back pain   . Abdominal pain   . Adenomatous colon polyp 2008  . Constipation   . Gout   . Hemorrhoids    Past Surgical History  Procedure Laterality Date  . Hand surgery    . Colonoscopy  10/09/2006    3 mm pedunculated sigmoid colon polyp removed/8-mm sessile hepatic flexure polyp (tubular villous adenoma) removed/6-mm  descending colon polyp removed/small internal  hemorrhoids  . Colonoscopy  02/28/2011    WUJ:WJXBJY, multiple in the rectum/Internal hemorrhoids, MODERATE-CAUSING RECTAL BLEEDING  . Flexible sigmoidoscopy  01/02/2012    Procedure: FLEXIBLE SIGMOIDOSCOPY;  Surgeon: West Bali, MD;  Location: [facility];  Service: Endoscopy;  Laterality: N/A;  11:30AM-changed to 10:15 Soledad Gerlach notified pt./WITH POSS BANDING   Family History  Problem Relation Age of Onset  . Hypertension Mother   . Hypertension Father   . Hypertension Sister   . Hypertension Brother   . Colon cancer Neg Hx    History  Substance Use Topics  . Smoking status: Former Smoker -- 1.00 packs/day for 23 years    Types: Cigarettes    Quit date: 03/13/2004  . Smokeless tobacco: Not on file  . Alcohol Use: 4.8 oz/week    8 Cans of beer per week    Review of Systems  Constitutional: Negative for fever and chills.  HENT: Negative for congestion, sore throat and neck pain.   Gastrointestinal: Negative for nausea, vomiting and abdominal pain.  Genitourinary: Negative for dysuria and difficulty urinating.  Musculoskeletal: Positive for arthralgias.       Swelling to top of his foot  Skin: Positive for color change. Negative for rash and wound.  Neurological: Negative for weakness, numbness and headaches.  All other systems reviewed and are negative.    Allergies  Review of patient's allergies indicates no known allergies.  Home Medications   Current Outpatient Rx  Name  Route  Sig  Dispense  Refill  . docusate sodium (COLACE) 100 MG capsule   Oral   Take 100 mg by mouth 2 (two) times daily.         [name] HYDROcodone-acetaminophen (NORCO/VICODIN) 5-325 MG per tablet   Oral   Take 1 tablet by mouth every 4 (four) hours as needed.   15 tablet   0   . indomethacin (INDOCIN) 25 MG capsule   Oral   Take 1 capsule (25 mg total) by mouth 3 (three) times daily as needed.   30 capsule   0   . lisinopril-hydrochlorothiazide (PRINZIDE,ZESTORETIC) 10-12.5 MG per  tablet      One tablet once daily Dose increase effective 04/23/2012   30 tablet   5    BP 108/66  Pulse 89  Temp(Src) 98.2 F (36.8 C) (Oral)  Resp 16  Ht 6\' 2"  (1.88 m)  Wt 204 lb (92.534 kg)  BMI 26.18 kg/m2  SpO2 98% Physical Exam  Nursing note and vitals reviewed. Constitutional: He is oriented to person, place, and time. He appears well-developed and well-nourished. No distress.  HENT:  Head: Normocephalic and atraumatic.  Cardiovascular: Normal rate, regular rhythm, normal heart sounds and intact distal pulses.   Pulmonary/Chest: Effort normal and breath sounds normal.  Musculoskeletal: He exhibits edema and tenderness.       Right foot: He exhibits tenderness, bony tenderness and swelling. He exhibits normal range of motion, normal capillary refill, no crepitus, no deformity and no laceration.       Feet:  Localized STS to the dorsolateral right foot.  ROM is preserved. Mild erythema.   DP pulse is brisk,distal sensation intact.  No abrasion, bruising, skin lesions or bony deformity.  No proximal tenderness.  Neurological: He is alert and oriented to person, place, and time. He exhibits normal muscle tone. Coordination normal.  Skin: Skin is warm and dry.    ED Course  Procedures (including critical care time) Labs Review Labs Reviewed - No data to display Imaging Review No results found.  MDM   Previous ED chart reviewed by me.   Patient has history of gout. Localized erythema and soft tissue swelling of the lateral right foot. Patient states previous medications prescribed are not helping but the pain. Patient reports sx's are similar to previous gout flares.  Neurovascularly intact,  ambulates with a slow but steady gait.  Cellulitis also considered but no lesions or open wounds to the foot.  Patient denies IV drug use.   Patient agrees to return here if the sx's are not improving.  He appears stable for discharge.  Ilia Dimaano L. Trisha Mangle, PA-C 12/07/12 0118

## 2012-12-06 NOTE — ED Notes (Signed)
Rt foot pain with swelling for 1 week, No injury, Says vicodin is not helping, also taking. indocin

## 2012-12-07 NOTE — ED Provider Notes (Signed)
Medical screening examination/treatment/procedure(s) were performed by non-physician practitioner and as supervising physician I was immediately available for consultation/collaboration.   Glynn Octave, MD 12/07/12 0120

## 2013-01-28 ENCOUNTER — Encounter (HOSPITAL_COMMUNITY): Payer: Self-pay | Admitting: Emergency Medicine

## 2013-01-28 ENCOUNTER — Emergency Department (HOSPITAL_COMMUNITY)
Admission: EM | Admit: 2013-01-28 | Discharge: 2013-01-28 | Disposition: A | Discharge status: Home / Self Care | Payer: BC Managed Care – PPO | Attending: Emergency Medicine | Admitting: Emergency Medicine

## 2013-01-28 DIAGNOSIS — Z79899 Other long term (current) drug therapy: Secondary | ICD-10-CM | POA: Insufficient documentation

## 2013-01-28 DIAGNOSIS — Z87891 Personal history of nicotine dependence: Secondary | ICD-10-CM | POA: Insufficient documentation

## 2013-01-28 DIAGNOSIS — I10 Essential (primary) hypertension: Secondary | ICD-10-CM | POA: Insufficient documentation

## 2013-01-28 DIAGNOSIS — M109 Gout, unspecified: Secondary | ICD-10-CM

## 2013-01-28 DIAGNOSIS — Z860100 Personal history of colon polyps, unspecified: Secondary | ICD-10-CM | POA: Insufficient documentation

## 2013-01-28 DIAGNOSIS — Z8719 Personal history of other diseases of the digestive system: Secondary | ICD-10-CM | POA: Insufficient documentation

## 2013-01-28 DIAGNOSIS — IMO0002 Reserved for concepts with insufficient information to code with codable children: Secondary | ICD-10-CM | POA: Insufficient documentation

## 2013-01-28 MED ORDER — PREDNISONE 10 MG PO TABS: ORAL_TABLET | ORAL | Status: DC

## 2013-01-28 MED ORDER — OXYCODONE-ACETAMINOPHEN 5-325 MG PO TABS
1.0000 | ORAL_TABLET | Freq: Once | ORAL | Status: AC
  Administered 2013-01-28: 1 via ORAL
  Filled 2013-01-28: qty 1

## 2013-01-28 MED ORDER — PREDNISONE 50 MG PO TABS
60.0000 mg | ORAL_TABLET | Freq: Once | ORAL | Status: AC
Start: 2013-01-28 — End: 2013-01-28
  Administered 2013-01-28: 60 mg via ORAL
  Filled 2013-01-28 (×2): qty 1

## 2013-01-28 MED ORDER — OXYCODONE-ACETAMINOPHEN 5-325 MG PO TABS: 1.0000 | ORAL_TABLET | ORAL | Status: DC | PRN

## 2013-01-28 MED ORDER — OXYCODONE-ACETAMINOPHEN 5-325 MG PO TABS: 2.0000 | ORAL_TABLET | ORAL | Status: DC | PRN

## 2013-01-28 NOTE — ED Provider Notes (Signed)
Medical screening examination/treatment/procedure(s) were performed by non-physician practitioner and as supervising physician I was immediately available for consultation/collaboration.  EKG Interpretation   None         Gilda Crease, MD 01/28/13 [phone]

## 2013-01-28 NOTE — ED Provider Notes (Signed)
CSN: [id number]     Arrival date & time 01/28/13  1656 History   First MD Initiated Contact with Patient 01/28/13 1701     Chief Complaint  Patient presents with  . Gout   (Consider location/radiation/quality/duration/timing/severity/associated sxs/prior Treatment) HPI Comments: JERIAH CORKUM is a 52 y.o. Male presenting with a return of his gout in his right lateral foot starting 2 days ago.  He reports intermittent episodes of gout in either foot,  Most recently when he was seen here 2 months ago.  He denies injury to the foot and no treatments prior to arrival.  He has had no fevers or chills and no radiation of pain.  In the past,  Prednisone has helped him the most with flare ups.      The history is provided by the patient.    Past Medical History  Diagnosis Date  . Hypertension   . High cholesterol   . Substance abuse     h/o excessive alcohol use; pt says he limits use to one to 2 beers daily he currently  . Neck pain   . Back pain   . Abdominal pain   . Adenomatous colon polyp 2008  . Constipation   . Gout   . Hemorrhoids    Past Surgical History  Procedure Laterality Date  . Hand surgery    . Colonoscopy  10/09/2006    3 mm pedunculated sigmoid colon polyp removed/8-mm sessile hepatic flexure polyp (tubular villous adenoma) removed/6-mm  descending colon polyp removed/small internal hemorrhoids  . Colonoscopy  02/28/2011    [id number], multiple in the rectum/Internal hemorrhoids, MODERATE-CAUSING RECTAL BLEEDING  . Flexible sigmoidoscopy  01/02/2012    Procedure: FLEXIBLE SIGMOIDOSCOPY;  Surgeon: West Bali, MD;  Location: [facility];  Service: Endoscopy;  Laterality: N/A;  11:30AM-changed to 10:15 Soledad Gerlach notified pt./WITH POSS BANDING   Family History  Problem Relation Age of Onset  . Hypertension Mother   . Hypertension Father   . Hypertension Sister   . Hypertension Brother   . Colon cancer Neg Hx    History  Substance Use Topics  . Smoking  status: Former Smoker -- 1.00 packs/day for 23 years    Types: Cigarettes    Quit date: 03/13/2004  . Smokeless tobacco: Not on file  . Alcohol Use: 4.8 oz/week    8 Cans of beer per week    Review of Systems  Constitutional: Negative for fever and chills.  Musculoskeletal: Positive for arthralgias and joint swelling. Negative for myalgias.  Skin: Positive for color change. Negative for rash and wound.  Neurological: Negative for weakness and numbness.    Allergies  Review of patient's allergies indicates no known allergies.  Home Medications   Current Outpatient Rx  Name  Route  Sig  Dispense  Refill  . docusate sodium (COLACE) 100 MG capsule   Oral   Take 100 mg by mouth 2 (two) times daily.         [name] lisinopril-hydrochlorothiazide (PRINZIDE,ZESTORETIC) 10-12.5 MG per tablet      One tablet once daily Dose increase effective 04/23/2012   30 tablet   5   . HYDROcodone-acetaminophen (NORCO/VICODIN) 5-325 MG per tablet   Oral   Take 1 tablet by mouth every 4 (four) hours as needed.   15 tablet   0   . indomethacin (INDOCIN) 25 MG capsule   Oral   Take 1 capsule (25 mg total) by mouth 3 (three) times daily as needed.  30 capsule   0   . oxyCODONE-acetaminophen (PERCOCET/ROXICET) 5-325 MG per tablet   Oral   Take 1 tablet by mouth every 4 (four) hours as needed for pain.   15 tablet   0   . oxyCODONE-acetaminophen (PERCOCET/ROXICET) 5-325 MG per tablet   Oral   Take 2 tablets by mouth every 4 (four) hours as needed for severe pain.   6 tablet   0   . oxyCODONE-acetaminophen (PERCOCET/ROXICET) 5-325 MG per tablet   Oral   Take 1 tablet by mouth every 4 (four) hours as needed for severe pain.   20 tablet   0   . predniSONE (DELTASONE) 10 MG tablet      Take 6 tablets day one, 5 tablets day two, 4 tablets day three, 3 tablets day four, 2 tablets day five, then 1 tablet day six   21 tablet   0   . predniSONE (DELTASONE) 10 MG tablet      6, 5, 4, 3,  2 then 1 tablet by mouth daily for 6 days total.   21 tablet   0    BP 142/85  Pulse 95  Temp(Src) 98.3 F (36.8 C) (Oral)  Resp 18  Ht 6\' 2"  (1.88 m)  Wt 203 lb (92.08 kg)  BMI 26.05 kg/m2  SpO2 100% Physical Exam  Constitutional: He appears well-developed and well-nourished.  HENT:  Head: Atraumatic.  Neck: Normal range of motion.  Cardiovascular:  Pulses:      Dorsalis pedis pulses are 2+ on the right side, and 2+ on the left side.  Pulses equal bilaterally  Musculoskeletal: He exhibits tenderness.       Feet:  Neurological: He is alert. He has normal strength. He displays normal reflexes. No sensory deficit.  Equal strength  Skin: Skin is warm and dry. No rash noted. There is erythema.  No skin injury.  Erythema lateral right foot centered over 5th mtp.  Edema which is localized.    Psychiatric: He has a normal mood and affect.    ED Course  Procedures (including critical care time) Labs Review Labs Reviewed - No data to display Imaging Review No results found.  EKG Interpretation   None       MDM   1. Gout attack    Pt was given prednisone taper, first dose given in ed.  Oxycodone for pain relief.  Elevation and warm compresses recommended.    Pts sx are consistent with his prior episodes of gout.  Denies injury,  No fevers or wounds, doubt this is an infectious process.    Burgess Amor, PA-C 01/28/13 1743

## 2013-01-28 NOTE — ED Notes (Signed)
Pt with recent gout flare up 2 months ago to side of right foot, pt states same symptoms that started 2 days ago and at same location

## 2013-02-03 MED FILL — Oxycodone w/ Acetaminophen Tab 5-325 MG: ORAL | Qty: 6 | Status: AC

## 2013-08-12 ENCOUNTER — Emergency Department (HOSPITAL_COMMUNITY)
Admission: EM | Admit: 2013-08-12 | Discharge: 2013-08-12 | Disposition: A | Discharge status: Home / Self Care | Payer: BC Managed Care – PPO | Attending: Emergency Medicine | Admitting: Emergency Medicine

## 2013-08-12 ENCOUNTER — Encounter (HOSPITAL_COMMUNITY): Payer: Self-pay | Admitting: Emergency Medicine

## 2013-08-12 DIAGNOSIS — Z87891 Personal history of nicotine dependence: Secondary | ICD-10-CM | POA: Insufficient documentation

## 2013-08-12 DIAGNOSIS — Z79899 Other long term (current) drug therapy: Secondary | ICD-10-CM | POA: Insufficient documentation

## 2013-08-12 DIAGNOSIS — Z8719 Personal history of other diseases of the digestive system: Secondary | ICD-10-CM | POA: Insufficient documentation

## 2013-08-12 DIAGNOSIS — IMO0002 Reserved for concepts with insufficient information to code with codable children: Secondary | ICD-10-CM | POA: Insufficient documentation

## 2013-08-12 DIAGNOSIS — M79671 Pain in right foot: Secondary | ICD-10-CM

## 2013-08-12 DIAGNOSIS — Z860100 Personal history of colon polyps, unspecified: Secondary | ICD-10-CM | POA: Insufficient documentation

## 2013-08-12 DIAGNOSIS — M79609 Pain in unspecified limb: Secondary | ICD-10-CM | POA: Insufficient documentation

## 2013-08-12 DIAGNOSIS — Z8639 Personal history of other endocrine, nutritional and metabolic disease: Secondary | ICD-10-CM | POA: Insufficient documentation

## 2013-08-12 DIAGNOSIS — I10 Essential (primary) hypertension: Secondary | ICD-10-CM | POA: Insufficient documentation

## 2013-08-12 DIAGNOSIS — Z862 Personal history of diseases of the blood and blood-forming organs and certain disorders involving the immune mechanism: Secondary | ICD-10-CM | POA: Insufficient documentation

## 2013-08-12 MED ORDER — PREDNISONE 50 MG PO TABS
60.0000 mg | ORAL_TABLET | Freq: Once | ORAL | Status: AC
  Administered 2013-08-12: 60 mg via ORAL
  Filled 2013-08-12 (×2): qty 1

## 2013-08-12 MED ORDER — LISINOPRIL 10 MG PO TABS: 10.0000 mg | ORAL_TABLET | Freq: Every day | ORAL | Status: DC

## 2013-08-12 MED ORDER — OXYCODONE-ACETAMINOPHEN 5-325 MG PO TABS: 1.0000 | ORAL_TABLET | ORAL | Status: DC | PRN

## 2013-08-12 MED ORDER — OXYCODONE-ACETAMINOPHEN 5-325 MG PO TABS
2.0000 | ORAL_TABLET | Freq: Once | ORAL | Status: AC
  Administered 2013-08-12: 2 via ORAL
  Filled 2013-08-12: qty 2

## 2013-08-12 MED ORDER — PREDNISONE 50 MG PO TABS: ORAL_TABLET | ORAL | Status: DC

## 2013-08-12 NOTE — ED Provider Notes (Signed)
CSN: [id number]     Arrival date & time 08/12/13  0349 History   First MD Initiated Contact with Patient 08/12/13 0411     Chief Complaint  Patient presents with  . Foot Pain      Patient is a 53 y.o. male presenting with lower extremity pain. The history is provided by the patient.  Foot Pain This is a recurrent problem. The current episode started yesterday. The problem occurs constantly. The problem has been gradually worsening. The symptoms are aggravated by walking. The symptoms are relieved by rest. He has tried rest for the symptoms. The treatment provided no relief.  pt with long h/o gout presents with pain in right foot He reports these symptoms are similar to prior episodes of gout Reports increased meat intake recently which flares his gout No trauma to foot No fever is reported  Past Medical History  Diagnosis Date  . Hypertension   . High cholesterol   . Substance abuse     h/o excessive alcohol use; pt says he limits use to one to 2 beers daily he currently  . Neck pain   . Back pain   . Abdominal pain   . Adenomatous colon polyp 2008  . Constipation   . Gout   . Hemorrhoids    Past Surgical History  Procedure Laterality Date  . Hand surgery    . Colonoscopy  10/09/2006    3 mm pedunculated sigmoid colon polyp removed/8-mm sessile hepatic flexure polyp (tubular villous adenoma) removed/6-mm  descending colon polyp removed/small internal hemorrhoids  . Colonoscopy  02/28/2011    [id number], multiple in the rectum/Internal hemorrhoids, MODERATE-CAUSING RECTAL BLEEDING  . Flexible sigmoidoscopy  01/02/2012    Procedure: FLEXIBLE SIGMOIDOSCOPY;  Surgeon: [name], MD;  Location: [facility];  Service: Endoscopy;  Laterality: N/A;  11:30AM-changed to 10:15 Darius Bump notified pt./WITH POSS BANDING   Family History  Problem Relation Age of Onset  . Hypertension Mother   . Hypertension Father   . Hypertension Sister   . Hypertension Brother   . Colon cancer  Neg Hx    History  Substance Use Topics  . Smoking status: Former Smoker -- 1.00 packs/day for 23 years    Types: Cigarettes    Quit date: 03/13/2004  . Smokeless tobacco: Not on file  . Alcohol Use: 4.8 oz/week    8 Cans of beer per week    Review of Systems  Constitutional: Negative for fever.  Musculoskeletal: Positive for arthralgias.      Allergies  Review of patient's allergies indicates no known allergies.  Home Medications   Prior to Admission medications   Medication Sig Start Date End Date Taking? Authorizing Provider  lisinopril (PRINIVIL,ZESTRIL) 10 MG tablet Take 1 tablet (10 mg total) by mouth daily. 08/12/13   Sharyon Cable, MD  oxyCODONE-acetaminophen (PERCOCET/ROXICET) 5-325 MG per tablet Take 1 tablet by mouth every 4 (four) hours as needed for severe pain. 08/12/13   Sharyon Cable, MD  predniSONE (DELTASONE) 50 MG tablet One tablet PO daily for 6 days 08/12/13   Sharyon Cable, MD   BP 139/89  Pulse 78  Temp(Src) 98.1 F (36.7 C) (Oral)  Resp 16  SpO2 97% Physical Exam CONSTITUTIONAL: Well developed/well nourished HEAD: Normocephalic/atraumatic EYES: EOMI ENMT: Mucous membranes moist NECK: supple no meningeal signs CV: S1/S2 noted, no murmurs/rubs/gallops noted LUNGS: Lungs are clear to auscultation bilaterally, no apparent distress ABDOMEN: soft, nontender, no rebound or guarding NEURO: Pt is awake/alert,  moves all extremitiesx4 EXTREMITIES: pulses normal, full ROM Tenderness right 1st MTP.  Tenderness to lateral aspect of right foot.  No bruising or deformity.  Pulses intact on right foot.  Distal motor/sensory intact to right foot Mild erythema noted.  No crepitus.  No abscess noted SKIN: warm, color normal PSYCH: no abnormalities of mood noted  ED Course  Procedures  Will treat for probable gout Low suspicion for septic arthritis He reports he usually responds to steroids for a week He also requests HTN meds Will give 2 weeks of  lisinopril and advised f/u with PCP for re-evaluation  MDM   Final diagnoses:  Right foot pain    Nursing notes including past medical history and social history reviewed and considered in documentation Previous records reviewed and considered Narcotic database reviewed    Sharyon Cable, MD 08/12/13 0430

## 2013-08-12 NOTE — ED Notes (Signed)
Per pt. Increased right foot pain. Pt. Reports hx of gout.

## 2013-08-14 ENCOUNTER — Ambulatory Visit (INDEPENDENT_AMBULATORY_CARE_PROVIDER_SITE_OTHER): Payer: BC Managed Care – PPO | Admitting: Family Medicine

## 2013-08-14 ENCOUNTER — Encounter: Payer: Self-pay | Admitting: Family Medicine

## 2013-08-14 VITALS — BP 144/86 | HR 84 | Resp 18 | Ht 74.0 in | Wt 224.0 lb

## 2013-08-14 DIAGNOSIS — E785 Hyperlipidemia, unspecified: Secondary | ICD-10-CM

## 2013-08-14 DIAGNOSIS — M62830 Muscle spasm of back: Secondary | ICD-10-CM

## 2013-08-14 DIAGNOSIS — M109 Gout, unspecified: Secondary | ICD-10-CM

## 2013-08-14 DIAGNOSIS — I10 Essential (primary) hypertension: Secondary | ICD-10-CM

## 2013-08-14 DIAGNOSIS — M5380 Other specified dorsopathies, site unspecified: Secondary | ICD-10-CM

## 2013-08-14 MED ORDER — INDOMETHACIN 25 MG PO CAPS: ORAL_CAPSULE | ORAL | Status: DC

## 2013-08-14 MED ORDER — PRAVASTATIN SODIUM 40 MG PO TABS: 40.0000 mg | ORAL_TABLET | Freq: Every day | ORAL | Status: DC

## 2013-08-14 MED ORDER — ALLOPURINOL 300 MG PO TABS: 300.0000 mg | ORAL_TABLET | Freq: Every day | ORAL | Status: DC

## 2013-08-14 MED ORDER — CYCLOBENZAPRINE HCL 10 MG PO TABS: 10.0000 mg | ORAL_TABLET | Freq: Every day | ORAL | Status: DC

## 2013-08-14 MED ORDER — LISINOPRIL 20 MG PO TABS: ORAL_TABLET | ORAL | Status: DC

## 2013-08-14 NOTE — Patient Instructions (Addendum)
F/u in 6 weeks, call if you need  Me before   increase BP med to two 10mg  tabs daily till done , new will be zestril 20mg  daily  Flexeril sent in for muscle spasm   Indomethacin sent in for gout pain, start today  Start allopurinol 0n 08/21/2013 to help reduce gout risk    Pravachol sent in for cholesterol

## 2013-08-15 ENCOUNTER — Telehealth: Payer: Self-pay | Admitting: Family Medicine

## 2013-08-15 DIAGNOSIS — I10 Essential (primary) hypertension: Secondary | ICD-10-CM

## 2013-08-15 MED ORDER — LISINOPRIL 20 MG PO TABS: ORAL_TABLET | ORAL | Status: DC

## 2013-08-15 NOTE — Assessment & Plan Note (Signed)
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

## 2013-08-15 NOTE — Assessment & Plan Note (Signed)
Acute flare affecting left foot with hyperuricemia. Has been startedon oral steroid by other provider, add indomethacin, short course in tapering doses Allopurinol to be started in 1 week also Dietary advice provided also

## 2013-08-15 NOTE — Telephone Encounter (Signed)
Med sent in again

## 2013-08-15 NOTE — Progress Notes (Signed)
   Subjective:    Patient ID: Tristan Cook, male    DOB: 1960/09/29, 53 y.o.   MRN: [id number]  HPI The PT is here for follow up and re-evaluation of chronic medical conditions, medication management and review of any available recent lab and radiology data.  Recently seen at urgent care with c/o acute foot pain due to gout, and started on BP meds. Has labs from the facility which show marked hyperlipidemia and hyperuricemia Has been unable to come in for over 1 year , no insurance , but is again working and he is back    Review of Systems See HPI Denies recent fever or chills. Denies sinus pressure, nasal congestion, ear pain or sore throat. Denies chest congestion, productive cough or wheezing. Denies chest pains, palpitations and leg swelling Denies abdominal pain, nausea, vomiting,diarrhea or constipation.   Denies dysuria, frequency, hesitancy or incontinence. Denies headaches, seizures, numbness, or tingling. Denies depression, anxiety or insomnia. Denies skin break down or rash.        Objective:   Physical Exam BP 144/86  Pulse 84  Resp 18  Ht 6\' 2"  (1.88 m)  Wt 224 lb 0.5 oz (101.619 kg)  BMI 28.75 kg/m2  SpO2 95% Patient alert and oriented and in no cardiopulmonary distress.  HEENT: No facial asymmetry, EOMI, no sinus tenderness,  oropharynx pink and moist.  Neck supple no adenopathy.  Chest: Clear to auscultation bilaterally.  CVS: S1, S2 no murmurs, no S3.  ABD: Soft non tender. Bowel sounds normal.  Ext: No edema  MS: Adequate ROM spine, shoulders, hips and knees.Positive lumbar spasm. Pain and tenderness over right great toe  Skin: Intact, no ulcerations or rash noted.  Psych: Good eye contact, normal affect. Memory intact not anxious or depressed appearing.  CNS: CN 2-12 intact, power, tone and sensation normal throughout.        Assessment & Plan:  HYPERTENSION Uncontrolled, inc dose of prinivil DASH diet and commitment to daily  physical activity for a minimum of 30 minutes discussed and encouraged, as a part of hypertension management. The importance of attaining a healthy weight is also discussed.   Gout Acute flare affecting left foot with hyperuricemia. Has been startedon oral steroid by other provider, add indomethacin, short course in tapering doses Allopurinol to be started in 1 week also Dietary advice provided also  Back spasm Increased back pain and spasm, stands for prolonged periods and walks  All day on the job 10 to 12 hour sessons, start flexeril at bedtime

## 2013-08-15 NOTE — Assessment & Plan Note (Signed)
Uncontrolled, inc dose of prinivil DASH diet and commitment to daily physical activity for a minimum of 30 minutes discussed and encouraged, as a part of hypertension management. The importance of attaining a healthy weight is also discussed.

## 2013-10-06 ENCOUNTER — Ambulatory Visit (INDEPENDENT_AMBULATORY_CARE_PROVIDER_SITE_OTHER): Payer: BC Managed Care – PPO | Admitting: Family Medicine

## 2013-10-06 ENCOUNTER — Encounter: Payer: Self-pay | Admitting: Family Medicine

## 2013-10-06 VITALS — BP 134/78 | HR 84 | Resp 18 | Ht 74.0 in | Wt 223.0 lb

## 2013-10-06 DIAGNOSIS — Z8739 Personal history of other diseases of the musculoskeletal system and connective tissue: Secondary | ICD-10-CM

## 2013-10-06 DIAGNOSIS — Z8639 Personal history of other endocrine, nutritional and metabolic disease: Secondary | ICD-10-CM

## 2013-10-06 DIAGNOSIS — E785 Hyperlipidemia, unspecified: Secondary | ICD-10-CM

## 2013-10-06 DIAGNOSIS — R7301 Impaired fasting glucose: Secondary | ICD-10-CM

## 2013-10-06 DIAGNOSIS — M62830 Muscle spasm of back: Secondary | ICD-10-CM

## 2013-10-06 DIAGNOSIS — I10 Essential (primary) hypertension: Secondary | ICD-10-CM

## 2013-10-06 DIAGNOSIS — Z862 Personal history of diseases of the blood and blood-forming organs and certain disorders involving the immune mechanism: Secondary | ICD-10-CM

## 2013-10-06 DIAGNOSIS — E663 Overweight: Secondary | ICD-10-CM | POA: Insufficient documentation

## 2013-10-06 DIAGNOSIS — Z23 Encounter for immunization: Secondary | ICD-10-CM

## 2013-10-06 DIAGNOSIS — M5380 Other specified dorsopathies, site unspecified: Secondary | ICD-10-CM

## 2013-10-06 NOTE — Assessment & Plan Note (Signed)
Vaccine administered.

## 2013-10-06 NOTE — Assessment & Plan Note (Signed)
Controlled, no change in medication DASH diet and commitment to daily physical activity for a minimum of 30 minutes discussed and encouraged, as a part of hypertension management. The importance of attaining a healthy weight is also discussed.  

## 2013-10-06 NOTE — Assessment & Plan Note (Signed)
Deteriorated. Patient re-educated about  the importance of commitment to a  minimum of 150 minutes of exercise per week. The importance of healthy food choices with portion control discussed. Encouraged to start a food diary, count calories and to consider  joining a support group. Sample diet sheets offered. Goals set by the patient for the next several months.    

## 2013-10-06 NOTE — Assessment & Plan Note (Signed)
Intolerant of muscle relaxant, tylenol advised

## 2013-10-06 NOTE — Assessment & Plan Note (Signed)
Hyperlipidemia:Low fat diet discussed and encouraged.  Updated lab needed at/ before next visit.  

## 2013-10-06 NOTE — Assessment & Plan Note (Addendum)
No recurrent  flare , re check uric acid level, continue allopurinol

## 2013-10-06 NOTE — Patient Instructions (Signed)
F/u in mid October , call if you need me before  BP is good today, no med change  It is important that you exercise regularly at least 30 minutes 5 times a week. If you develop chest pain, have severe difficulty breathing, or feel very tired, stop exercising immediately and seek medical attention   Reduce fried and fatty food and work on weight loss  TdAP today  Fasting lipid, cmp, HBa1C and uric acid level in October  New for back pain is tylenol 500mg  one at bedtime as needed, this is OTC  New for heart protection is aspirin 81mg  one daily

## 2013-10-06 NOTE — Progress Notes (Signed)
   Subjective:    Patient ID: Tristan Cook, male    DOB: [birth year], 53 y.o.   MRN: [id number]  HPI The PT is here for follow up and re-evaluation of chronic medical conditions, medication management and review of any available recent lab and radiology data.  Preventive health is updated, specifically  Cancer screening and Immunization.   . The PT reports intolerance of muscle relaxant, made him excessively sleepy Plans to work on weight loss, and recently acquired home equipment for this  C/o low back pain and spasm at the end of the workday, needs some medication for this      Review of Systems See HPI Denies recent fever or chills. Denies sinus pressure, nasal congestion, ear pain or sore throat. Denies chest congestion, productive cough or wheezing. Denies chest pains, palpitations and leg swelling Denies abdominal pain, nausea, vomiting,diarrhea or constipation.   Denies dysuria, frequency, hesitancy or incontinence.  Denies headaches, seizures, numbness, or tingling. Denies depression, anxiety or insomnia. Denies skin break down or rash.        Objective:   Physical Exam BP 134/78  Pulse 84  Resp 18  Ht 6\' 2"  (1.88 m)  Wt 223 lb (101.152 kg)  BMI 28.62 kg/m2  SpO2 99%  Patient alert and oriented and in no cardiopulmonary distress.  HEENT: No facial asymmetry, EOMI,   oropharynx pink and moist.  Neck supple no JVD, no mass.  Chest: Clear to auscultation bilaterally.  CVS: S1, S2 no murmurs, no S3.Regular rate.  ABD: Soft non tender.   Ext: No edema  MS: Adequate ROM spine, shoulders, hips and knees.  Skin: Intact, no ulcerations or rash noted.  Psych: Good eye contact, normal affect. Memory intact not anxious or depressed appearing.  CNS: CN 2-12 intact, power,  normal throughout.no focal deficits noted.       Assessment & Plan:

## 2013-10-07 NOTE — Progress Notes (Signed)
   Subjective:    Patient ID: Tristan Cook, male    DOB: 23-Apr-1960, 53 y.o.   MRN: [id number]  HPI    Review of Systems     Objective:   Physical Exam        Assessment & Plan:  Need for Tdap vaccination Vaccine administered  Back spasm Intolerant of muscle relaxant, tylenol advised  HYPERTENSION Controlled, no change in medication DASH diet and commitment to daily physical activity for a minimum of 30 minutes discussed and encouraged, as a part of hypertension management. The importance of attaining a healthy weight is also discussed.   HYPERLIPIDEMIA Hyperlipidemia:Low fat diet discussed and encouraged.  Updated lab needed at/ before next visit.   Gout No recurrent  flare , re check uric acid level, continue allopurinol  Overweight (BMI 25.0-29.9) Deteriorated. Patient re-educated about  the importance of commitment to a  minimum of 150 minutes of exercise per week. The importance of healthy food choices with portion control discussed. Encouraged to start a food diary, count calories and to consider  joining a support group. Sample diet sheets offered. Goals set by the patient for the next several months.

## 2013-10-08 ENCOUNTER — Telehealth: Payer: Self-pay | Admitting: *Deleted

## 2013-10-08 ENCOUNTER — Other Ambulatory Visit: Payer: Self-pay

## 2013-10-08 MED ORDER — METHOCARBAMOL 500 MG PO TABS: 500.0000 mg | ORAL_TABLET | Freq: Two times a day (BID) | ORAL | Status: DC | PRN

## 2013-10-08 NOTE — Telephone Encounter (Signed)
Robaxin 500mg  sent 1 bid prn per Dr. Virgel Gess to pharmacy

## 2013-10-08 NOTE — Telephone Encounter (Signed)
Muscle tightness in back x 3 days and makes it hard for him to work. Wants to know if you can prescribe a muscle relaxer.   [facility]

## 2013-10-08 NOTE — Telephone Encounter (Signed)
Patient aware.

## 2013-10-08 NOTE — Telephone Encounter (Signed)
Pt called stating his muscles tighten up while he's at work and per pt he would like a muscle relaxer.

## 2013-11-04 ENCOUNTER — Ambulatory Visit (INDEPENDENT_AMBULATORY_CARE_PROVIDER_SITE_OTHER): Payer: BC Managed Care – PPO | Admitting: Family Medicine

## 2013-11-04 ENCOUNTER — Encounter: Payer: Self-pay | Admitting: Family Medicine

## 2013-11-04 ENCOUNTER — Ambulatory Visit (HOSPITAL_COMMUNITY)
Admission: RE | Admit: 2013-11-04 | Discharge: 2013-11-04 | Disposition: A | Discharge status: Home / Self Care | Payer: BC Managed Care – PPO | Source: Ambulatory Visit | Attending: Family Medicine | Admitting: Family Medicine

## 2013-11-04 ENCOUNTER — Encounter (INDEPENDENT_AMBULATORY_CARE_PROVIDER_SITE_OTHER): Payer: Self-pay

## 2013-11-04 VITALS — BP 126/74 | HR 82 | Resp 18 | Wt 217.0 lb

## 2013-11-04 DIAGNOSIS — M5380 Other specified dorsopathies, site unspecified: Secondary | ICD-10-CM

## 2013-11-04 DIAGNOSIS — M79609 Pain in unspecified limb: Secondary | ICD-10-CM | POA: Diagnosis present

## 2013-11-04 DIAGNOSIS — M79671 Pain in right foot: Secondary | ICD-10-CM

## 2013-11-04 DIAGNOSIS — M62830 Muscle spasm of back: Secondary | ICD-10-CM

## 2013-11-04 DIAGNOSIS — I10 Essential (primary) hypertension: Secondary | ICD-10-CM

## 2013-11-04 NOTE — Progress Notes (Signed)
   Subjective:    Patient ID: Tristan Cook, male    DOB: Jun 14, 1960, 53 y.o.   MRN: [id number]  HPI Acute rigth foot pain on top of arch started for no known reason while at work last week Friday,Not similar to gout pain No known injury/trauma  Limitation in flexion and extension of the foot. Left job at [employer] today, otherwise has been working with the pain No other concerns   Review of Systems See HPI Denies recent fever or chills. Denies sinus pressure, nasal congestion, ear pain or sore throat. Denies chest congestion, productive cough or wheezing. Denies chest pains, palpitations and leg swelling Denies abdominal pain, nausea, vomiting,diarrhea or constipation.   Denies dysuria, frequency, hesitancy or incontinence.        Objective:   Physical Exam  BP 126/74  Pulse 82  Resp 18  Wt 217 lb (98.431 kg)  SpO2 100% Patient alert and oriented and in no cardiopulmonary distress.Pt in pain  HEENT: No facial asymmetry, EOMI,   oropharynx pink and moist.  Neck supple no JVD, no mass.  Chest: Clear to auscultation bilaterally.  CVS: S1, S2 no murmurs, no S3.Regular rate.  ABD: Soft non tender.   Ext: No edema  MS: Adequate ROM spine, shoulders, hips and knees.Tender over dorsum of affected foot , no redness no reduction in mobilityy  Skin: Intact, no ulcerations or rash noted.         Assessment & Plan:  Foot pain, right Acute disabling foot pain, not involving joint but on dorsum of affected foot. Recently treated for acute gout, currently on allopurinol with improvement in uric acid level Work excuse and refer to podiatry   HYPERTENSION Controlled, no change in medication   Back spasm Improved with medication

## 2013-11-04 NOTE — Patient Instructions (Addendum)
F/u as before  Pls get xray of right foot today  Pls take ibuprofen 200mg  tablets 3 twice daily for the next 3 days, stop as soon as you see podiatry , and let podiatrist treat the problem  Uric acid and chem 7 today   Work [place] will be prepared tomorrow as soon as I know when you will see podiatry

## 2013-11-05 ENCOUNTER — Telehealth: Payer: Self-pay | Admitting: Family Medicine

## 2013-11-05 LAB — BASIC METABOLIC PANEL
BUN: 16 mg/dL (ref 6–23)
CALCIUM: 10.1 mg/dL (ref 8.4–10.5)
CO2: 26 meq/L (ref 19–32)
CREATININE: 1.39 mg/dL — AB (ref 0.50–1.35)
Chloride: 102 mEq/L (ref 96–112)
Glucose, Bld: 99 mg/dL (ref 70–99)
Potassium: 5.1 mEq/L (ref 3.5–5.3)
Sodium: 137 mEq/L (ref 135–145)

## 2013-11-05 LAB — URIC ACID: URIC ACID, SERUM: 6 mg/dL (ref 4.0–7.8)

## 2013-11-05 NOTE — Telephone Encounter (Signed)
ATT: Caryl Pina when faxing

## 2013-11-05 NOTE — Telephone Encounter (Signed)
pls print work excuse to start from 8/25 until evaluated by podiatr on September 2 who will make further recommendations i will sign, he is calling in with number to fax this to/ will collect

## 2013-11-05 NOTE — Telephone Encounter (Signed)
Letter typed to be signed by Dr

## 2013-11-05 NOTE — Telephone Encounter (Signed)
Work note faxed

## 2013-11-05 NOTE — Telephone Encounter (Signed)
Didn't see this message and sent you another one

## 2013-11-05 NOTE — Telephone Encounter (Signed)
Noted and will refax.

## 2013-11-06 ENCOUNTER — Telehealth: Payer: Self-pay | Admitting: Family Medicine

## 2013-11-06 NOTE — Telephone Encounter (Signed)
Noted and faxed to given #

## 2013-11-10 ENCOUNTER — Telehealth: Payer: Self-pay

## 2013-11-11 ENCOUNTER — Other Ambulatory Visit: Payer: Self-pay

## 2013-11-11 MED ORDER — INDOMETHACIN 25 MG PO CAPS: ORAL_CAPSULE | ORAL | Status: DC

## 2013-11-11 NOTE — Telephone Encounter (Signed)
Med refilled and patient aware to check with pharmacy this afternoon.

## 2013-11-11 NOTE — Telephone Encounter (Signed)
Left foot pain.  Is asking for Indomethacin for gout flare.  Insures that he is taking allopurinol daily.  Please advise.   Sees podiatry tomorrow for right foot.

## 2013-11-11 NOTE — Telephone Encounter (Signed)
pls refill the indomethacin he has had inn the past and let him know  He NEEDS to see podiatry

## 2013-11-18 NOTE — Assessment & Plan Note (Signed)
Improved with medication

## 2013-11-18 NOTE — Assessment & Plan Note (Signed)
Controlled, no change in medication  

## 2013-11-18 NOTE — Assessment & Plan Note (Signed)
Acute disabling foot pain, not involving joint but on dorsum of affected foot. Recently treated for acute gout, currently on allopurinol with improvement in uric acid level Work excuse and refer to podiatry

## 2014-01-09 ENCOUNTER — Other Ambulatory Visit: Payer: Self-pay | Admitting: Family Medicine

## 2014-01-13 ENCOUNTER — Other Ambulatory Visit: Payer: Self-pay

## 2014-01-13 ENCOUNTER — Telehealth: Payer: Self-pay | Admitting: *Deleted

## 2014-01-13 DIAGNOSIS — M1000 Idiopathic gout, unspecified site: Secondary | ICD-10-CM

## 2014-01-13 MED ORDER — ALLOPURINOL 300 MG PO TABS: 300.0000 mg | ORAL_TABLET | Freq: Every day | ORAL | Status: DC

## 2014-01-13 NOTE — Telephone Encounter (Signed)
Pt called stating he needs his gout medicine refilled. Please advise

## 2014-01-13 NOTE — Telephone Encounter (Signed)
Allopurinol refilled

## 2014-01-14 ENCOUNTER — Telehealth: Payer: Self-pay

## 2014-01-14 MED ORDER — PREDNISONE (PAK) 5 MG PO TABS: 5.0000 mg | ORAL_TABLET | ORAL | Status: DC

## 2014-01-14 MED ORDER — INDOMETHACIN 25 MG PO CAPS: ORAL_CAPSULE | ORAL | Status: DC

## 2014-01-14 NOTE — Addendum Note (Signed)
Addended by: Denman George B on: 01/14/2014 01:02 PM   Modules accepted: Orders, Medications

## 2014-01-14 NOTE — Telephone Encounter (Signed)
Is it ok to refill Indomethacin?

## 2014-01-14 NOTE — Telephone Encounter (Signed)
pls refill indomethacin x 1 and also send in pred 5 mg dose pack # 21 and let him know

## 2014-01-14 NOTE — Telephone Encounter (Signed)
Med s sent to pharmacy and patient aware.

## 2014-01-15 ENCOUNTER — Other Ambulatory Visit: Payer: Self-pay | Admitting: Family Medicine

## 2014-01-16 LAB — COMPREHENSIVE METABOLIC PANEL
ALBUMIN: 4.3 g/dL (ref 3.5–5.2)
ALK PHOS: 64 U/L (ref 39–117)
ALT: 21 U/L (ref 0–53)
AST: 39 U/L — ABNORMAL HIGH (ref 0–37)
BILIRUBIN TOTAL: 0.5 mg/dL (ref 0.2–1.2)
BUN: 10 mg/dL (ref 6–23)
CO2: 26 mEq/L (ref 19–32)
Calcium: 9.5 mg/dL (ref 8.4–10.5)
Chloride: 98 mEq/L (ref 96–112)
Creat: 1.23 mg/dL (ref 0.50–1.35)
GLUCOSE: 90 mg/dL (ref 70–99)
Potassium: 4.6 mEq/L (ref 3.5–5.3)
Sodium: 136 mEq/L (ref 135–145)
Total Protein: 7.3 g/dL (ref 6.0–8.3)

## 2014-01-16 LAB — LIPID PANEL
CHOL/HDL RATIO: 2.9 ratio
Cholesterol: 212 mg/dL — ABNORMAL HIGH (ref 0–200)
HDL: 73 mg/dL (ref >39)
LDL CALC: 111 mg/dL — AB (ref 0–99)
Triglycerides: 138 mg/dL (ref <150)
VLDL: 28 mg/dL (ref 0–40)

## 2014-01-16 LAB — HEMOGLOBIN A1C
Hgb A1c MFr Bld: 5.8 % — ABNORMAL HIGH (ref <5.7)
Mean Plasma Glucose: 120 mg/dL — ABNORMAL HIGH (ref <117)

## 2014-01-16 LAB — URIC ACID: Uric Acid, Serum: 3.5 mg/dL — ABNORMAL LOW (ref 4.0–7.8)

## 2014-01-19 ENCOUNTER — Ambulatory Visit: Payer: BC Managed Care – PPO | Admitting: Family Medicine

## 2014-01-20 ENCOUNTER — Encounter: Payer: Self-pay | Admitting: Family Medicine

## 2014-01-20 ENCOUNTER — Encounter (INDEPENDENT_AMBULATORY_CARE_PROVIDER_SITE_OTHER): Payer: Self-pay

## 2014-01-20 ENCOUNTER — Ambulatory Visit (INDEPENDENT_AMBULATORY_CARE_PROVIDER_SITE_OTHER): Payer: BC Managed Care – PPO | Admitting: Family Medicine

## 2014-01-20 VITALS — BP 130/70 | HR 100 | Resp 18 | Ht 74.0 in | Wt 214.0 lb

## 2014-01-20 DIAGNOSIS — G8929 Other chronic pain: Secondary | ICD-10-CM | POA: Insufficient documentation

## 2014-01-20 DIAGNOSIS — E785 Hyperlipidemia, unspecified: Secondary | ICD-10-CM

## 2014-01-20 DIAGNOSIS — M1000 Idiopathic gout, unspecified site: Secondary | ICD-10-CM

## 2014-01-20 DIAGNOSIS — R7301 Impaired fasting glucose: Secondary | ICD-10-CM

## 2014-01-20 DIAGNOSIS — E663 Overweight: Secondary | ICD-10-CM

## 2014-01-20 DIAGNOSIS — R7302 Impaired glucose tolerance (oral): Secondary | ICD-10-CM

## 2014-01-20 DIAGNOSIS — M79671 Pain in right foot: Secondary | ICD-10-CM

## 2014-01-20 DIAGNOSIS — M79674 Pain in right toe(s): Secondary | ICD-10-CM

## 2014-01-20 DIAGNOSIS — I10 Essential (primary) hypertension: Secondary | ICD-10-CM

## 2014-01-20 MED ORDER — HYDROCODONE-ACETAMINOPHEN 5-325 MG PO TABS: ORAL_TABLET | ORAL | Status: DC

## 2014-01-20 NOTE — Patient Instructions (Addendum)
CPE in 4 month, call if you need me before  Blood pressure is good, cholesterol much improved, and uric acid normal Continue medication you are taking  Keep appt with foot specialist, due to ongoing pain and swelling of right great toe base.  I will send copy of recent lab, a note and copy of script I nm writing, this ONE time only for the pain you are having, if you have need for chronic pain medication for foot pain , the podiatrist treating you will need to prescribe  Cut back on candy and starches, blood sugar is slightly elevated  Fasting lipid, cmp anfd hBa1C, uric acid in 4 month

## 2014-01-20 NOTE — Progress Notes (Signed)
Subjective:    Patient ID: Tristan Cook, male    DOB: 1961/02/13, 53 y.o.   MRN: [id number]  HPI Pt in with 10 day h/o right great toe base pain and swelling, tender, unable to weight bear without pain, saw podiatry on 10/28, was re diagnosed with inflammation in 2nd toe , put in a short boot , and since that time he has had increased pain symptoms in right great toe He denies warmth or redness of area, and his uric acid level is well below normal He has been out of work for this reason for some time now and is anxious to return Updated labs are reviewed    Review of Systems See HPI Denies recent fever or chills. Denies sinus pressure, nasal congestion, ear pain or sore throat. Denies chest congestion, productive cough or wheezing. Denies chest pains, palpitations and leg swelling Denies abdominal pain, nausea, vomiting,diarrhea or constipation.   Denies dysuria, frequency, hesitancy or incontinence.  Denies headaches, seizures, numbness, or tingling. Has some anxiety and insomnia due to ongoing uncontrolled pain, and no improvement in his chronic condition, which is preventing him from working Denies skin break down or rash.        Objective:   Physical Exam  BP 130/70 mmHg  Pulse 100  Resp 18  Ht 6\' 2"  (1.88 m)  Wt 214 lb (97.07 kg)  BMI 27.46 kg/m2  SpO2 97%  Patient alert and oriented and in no cardiopulmonary distress.  HEENT: No facial asymmetry, EOMI,   oropharynx pink and moist.  Neck supple no JVD, no mass.  Chest: Clear to auscultation bilaterally.  CVS: S1, S2 no murmurs, no S3.Regular rate.  ABD: Soft non tender.   Ext: No edema  MS: Adequate ROM spine, shoulders, hips and knees.Tender over dorsum of right foot at base of great toe, no warmth or eryhtema noted  Skin: Intact, no ulcerations or rash noted.  Psych: Good eye contact, normal affect. Memory intact not anxious or depressed appearing.  CNS: CN 2-12 intact, power,  normal throughout.no  focal deficits noted.        Assessment & Plan:  Essential hypertension Controlled, no change in medication DASH diet and commitment to daily physical activity for a minimum of 30 minutes discussed and encouraged, as a part of hypertension management. The importance of attaining a healthy weight is also discussed.   Hyperlipidemia LDL goal <100 Improved, npo med change Hyperlipidemia:Low fat diet discussed and encouraged.  Updated lab needed at/ before next visit.   Foot pain, right Worsened, currently being treated by podiatry. Short course of hydrocodone prescribed, pain management will need to be by podiatry if ongoing need persists. Clinically I do not think this is gout, pt does however swelling and deformity and tenderness in area of pain. Correspondence sent to podiatrist, lab and script given   Gout Uric acid well controlled on current dose of allopurinol, continue same  Overweight (BMI 25.0-29.9) Deteriorated. Patient re-educated about  the importance of commitment to a  minimum of 150 minutes of exercise per week. The importance of healthy food choices with portion control discussed. Encouraged to start a food diary, count calories and to consider  joining a support group. Sample diet sheets offered. Goals set by the patient for the next several months.     IGT (impaired glucose tolerance) Patient educated about the importance of limiting  Carbohydrate intake , the need to commit to daily physical activity for a minimum of 30 minutes ,  and to commit weight loss. The fact that changes in all these areas will reduce or eliminate all together the development of diabetes is stressed.   Updated lab needed at/ before next visit.

## 2014-01-22 LAB — PSA: PSA: 0.41 ng/mL (ref <=4.00)

## 2014-01-24 DIAGNOSIS — R7302 Impaired glucose tolerance (oral): Secondary | ICD-10-CM | POA: Insufficient documentation

## 2014-01-24 NOTE — Assessment & Plan Note (Signed)
Patient educated about the importance of limiting  Carbohydrate intake , the need to commit to daily physical activity for a minimum of 30 minutes , and to commit weight loss. The fact that changes in all these areas will reduce or eliminate all together the development of diabetes is stressed.   Updated lab needed at/ before next visit.  

## 2014-01-24 NOTE — Assessment & Plan Note (Signed)
Improved, npo med change Hyperlipidemia:Low fat diet discussed and encouraged.  Updated lab needed at/ before next visit.

## 2014-01-24 NOTE — Assessment & Plan Note (Signed)
Controlled, no change in medication DASH diet and commitment to daily physical activity for a minimum of 30 minutes discussed and encouraged, as a part of hypertension management. The importance of attaining a healthy weight is also discussed.  

## 2014-01-24 NOTE — Assessment & Plan Note (Signed)
Worsened, currently being treated by podiatry. Short course of hydrocodone prescribed, pain management will need to be by podiatry if ongoing need persists. Clinically I do not think this is gout, pt does however swelling and deformity and tenderness in area of pain. Correspondence sent to podiatrist, lab and script given

## 2014-01-24 NOTE — Assessment & Plan Note (Signed)
Deteriorated. Patient re-educated about  the importance of commitment to a  minimum of 150 minutes of exercise per week. The importance of healthy food choices with portion control discussed. Encouraged to start a food diary, count calories and to consider  joining a support group. Sample diet sheets offered. Goals set by the patient for the next several months.    

## 2014-01-24 NOTE — Assessment & Plan Note (Signed)
Uric acid well controlled on current dose of allopurinol, continue same

## 2014-02-10 ENCOUNTER — Telehealth: Payer: Self-pay

## 2014-02-10 NOTE — Telephone Encounter (Signed)
States he has to go back for a follow up to [facility] and if he's not satisfied with their plan then he will call back here for referral at that time

## 2014-02-10 NOTE — Telephone Encounter (Signed)
Patient called very upset stating that his right foot is still hurting and swollen and the foot Dr he was referred to cannot give him any answers. He has been out of work for a month and he cannot continue like this. He has been soaking it in epsom salt often and it still wakes him up in the night hurting badly. Wanted to speak with you directly but I assured him I would discuss this with you and get back to him.

## 2014-02-10 NOTE — Telephone Encounter (Signed)
He came here having already seeing his current podiatrist. I suggest since not improving needs new opinion, easier access to podiatry in  [place] so offer appt there and then enter urgent referral for that or for foot orthoopedic eval by  Dr Debroah Loop opffice in [place] pls let me know update

## 2014-02-23 ENCOUNTER — Telehealth: Payer: Self-pay | Admitting: Family Medicine

## 2014-02-23 MED ORDER — PRAVASTATIN SODIUM 40 MG PO TABS: 40.0000 mg | ORAL_TABLET | Freq: Every day | ORAL | Status: DC

## 2014-02-23 NOTE — Telephone Encounter (Signed)
Med refilled.

## 2014-03-23 ENCOUNTER — Telehealth: Payer: Self-pay

## 2014-03-23 MED ORDER — BENZONATATE 100 MG PO CAPS: 100.0000 mg | ORAL_CAPSULE | Freq: Three times a day (TID) | ORAL | Status: DC

## 2014-03-23 MED ORDER — LEVOFLOXACIN 500 MG PO TABS: 500.0000 mg | ORAL_TABLET | Freq: Every day | ORAL | Status: DC

## 2014-03-23 MED ORDER — PROMETHAZINE-DM 6.25-15 MG/5ML PO SYRP: ORAL_SOLUTION | ORAL | Status: DC

## 2014-03-23 NOTE — Telephone Encounter (Addendum)
Medications (3 ) are sent to [facility], if not improving will need to be seen, call back will work in if able or advise urgent care, pt aware

## 2014-03-23 NOTE — Telephone Encounter (Signed)
Called today wanting to be seen tomorrow for sickness. Advised no openings. C/o yellow sinus drainage and bad cough with colored phlegm coming up x 1 week. Has had chills also. Coughing is so bad at night he can't sleep. Please advise

## 2014-04-17 ENCOUNTER — Other Ambulatory Visit: Payer: Self-pay

## 2014-04-17 ENCOUNTER — Telehealth: Payer: Self-pay

## 2014-04-17 MED ORDER — INDOMETHACIN 25 MG PO CAPS: ORAL_CAPSULE | ORAL | Status: DC

## 2014-04-17 MED ORDER — PREDNISONE (PAK) 5 MG PO TABS: ORAL_TABLET | ORAL | Status: DC

## 2014-04-17 NOTE — Telephone Encounter (Signed)
pls erx and let him know prednisone 5 mg dose pack, and refill indomethacin that he has had in the past, if not present I will send this in Offer nurse visit nex Monday for toradol and depo medrol 60 mg and 80 mg IM respectively he has severe arthritis

## 2014-04-17 NOTE — Telephone Encounter (Signed)
Started having severe pain on the top of his right foot. Some swelling. Didn't injure it in any way. I advised urgent care but he wanted to wait and hear from you before going. Please advise

## 2014-04-17 NOTE — Telephone Encounter (Signed)
Will call back Monday if he wants shots - meds sent

## 2014-05-09 ENCOUNTER — Other Ambulatory Visit: Payer: Self-pay | Admitting: Family Medicine

## 2014-05-29 ENCOUNTER — Ambulatory Visit (INDEPENDENT_AMBULATORY_CARE_PROVIDER_SITE_OTHER): Payer: BLUE CROSS/BLUE SHIELD | Admitting: Family Medicine

## 2014-05-29 ENCOUNTER — Encounter: Payer: Self-pay | Admitting: Family Medicine

## 2014-05-29 ENCOUNTER — Other Ambulatory Visit: Payer: Self-pay

## 2014-05-29 VITALS — BP 138/84 | HR 77 | Resp 16 | Ht 74.0 in | Wt 213.0 lb

## 2014-05-29 DIAGNOSIS — Z1211 Encounter for screening for malignant neoplasm of colon: Secondary | ICD-10-CM

## 2014-05-29 DIAGNOSIS — R1310 Dysphagia, unspecified: Secondary | ICD-10-CM

## 2014-05-29 DIAGNOSIS — Z0000 Encounter for general adult medical examination without abnormal findings: Secondary | ICD-10-CM | POA: Insufficient documentation

## 2014-05-29 LAB — POC HEMOCCULT BLD/STL (OFFICE/1-CARD/DIAGNOSTIC): FECAL OCCULT BLD: NEGATIVE

## 2014-05-29 MED ORDER — INDOMETHACIN 25 MG PO CAPS: ORAL_CAPSULE | ORAL | Status: DC

## 2014-05-29 MED ORDER — PREDNISONE (PAK) 5 MG PO TABS: ORAL_TABLET | ORAL | Status: DC

## 2014-05-29 NOTE — Assessment & Plan Note (Signed)

## 2014-05-29 NOTE — Progress Notes (Signed)
   Subjective:    Patient ID: Tristan Cook, male    DOB: 04-24-1960, 54 y.o.   MRN: [id number]  HPI Patient is in for annual physical exam. States the pain in his right foot is much improved though at times still gets a "twinge", requested after visit that a refill of anti inflammatory be sent in , in the event he has an acute flare of pain. Labs results will need to be called to him as he had them today Currently exercises over 150 mins per week, and tries to eat healthily ROS is negative other than the fact that he also c/o increased difficulty swallowing solid foods, and requests GI re eval    Review of Systems See HPI     Objective:   Physical Exam BP 138/84 mmHg  Pulse 77  Resp 16  Ht 6\' 2"  (1.88 m)  Wt 213 lb (96.616 kg)  BMI 27.34 kg/m2  SpO2 99% Pleasant overweight  male, alert and oriented x 3, in no cardio-pulmonary distress. Afebrile. HEENT No facial trauma or asymetry. Sinuses non tender. EOMI, PERTL, fundoscopic exam is negative for hemorhages or exudates. External ears normal, tympanic membranes clear. Oropharynx moist, no exudate, fair dentition. Neck: supple, no adenopathy,JVD or thyromegaly.No bruits.  Chest: Clear to ascultation bilaterally.No crackles or wheezes. Non tender to palpation  Breast: No asymetry,no masses. No nipple discharge or inversion. No axillary or supraclavicular adenopathy  Cardiovascular system; Heart sounds normal,  S1 and  S2 ,no S3.  No murmur, or thrill. Apical beat not displaced Peripheral pulses normal.  Abdomen: Soft, non tender, no organomegaly or masses. No bruits. Bowel sounds normal. No guarding, tenderness or rebound.  Rectal:  Normal sphincter tone. Small internal  hemorrhoids . guaiac negative stool. Prostate smooth and firm  GU: No penile lesion or discharge. No testicular mass or tenderness.  Musculoskeletal exam: Full ROM of spine, hips , shoulders and knees. No deformity ,swelling or crepitus  noted. No muscle wasting or atrophy.   Neurologic: Cranial nerves 2 to 12 intact. Power, tone ,sensation and reflexes normal throughout. No disturbance in gait. No tremor.  Skin: Intact, no ulceration, erythema , scaling or rash noted. Pigmentation normal throughout  Psych; Normal mood and affect. Judgement and concentration normal         Assessment & Plan:  Annual physical exam Annual exam as documented. Counseling done  re healthy lifestyle involving commitment to 150 minutes exercise per week, heart healthy diet, and attaining healthy weight.The importance of adequate sleep also discussed. Regular seat belt use and home safety, is also discussed. Changes in health habits are decided on by the patient with goals and time frames  set for achieving them. Immunization and cancer screening needs are specifically addressed at this visit.    Dysphagia C/o increased difficulty swallowing , feels as though food is sticking at times, has been happening for over 6 months, needs re eval by GI

## 2014-05-29 NOTE — Patient Instructions (Signed)
F/u in  5 month, call if you need m before  You are referred to Dr Oneida Alar re swallowing difficulty  Pls bring all medications to b visit  Blood pressure slightly higher than I would like so work on healthy lifestyle please

## 2014-05-30 LAB — COMPREHENSIVE METABOLIC PANEL
ALBUMIN: 4 g/dL (ref 3.5–5.2)
ALK PHOS: 63 U/L (ref 39–117)
ALT: 8 U/L (ref 0–53)
AST: 16 U/L (ref 0–37)
BUN: 10 mg/dL (ref 6–23)
CALCIUM: 9.6 mg/dL (ref 8.4–10.5)
CHLORIDE: 105 meq/L (ref 96–112)
CO2: 28 mEq/L (ref 19–32)
Creat: 1.19 mg/dL (ref 0.50–1.35)
GLUCOSE: 97 mg/dL (ref 70–99)
Potassium: 4.2 mEq/L (ref 3.5–5.3)
Sodium: 141 mEq/L (ref 135–145)
Total Bilirubin: 1 mg/dL (ref 0.2–1.2)
Total Protein: 6.6 g/dL (ref 6.0–8.3)

## 2014-05-30 LAB — URIC ACID: URIC ACID, SERUM: 4.3 mg/dL (ref 4.0–7.8)

## 2014-05-30 LAB — LIPID PANEL
CHOL/HDL RATIO: 2.8 ratio
Cholesterol: 158 mg/dL (ref 0–200)
HDL: 56 mg/dL (ref >=40)
LDL Cholesterol: 71 mg/dL (ref 0–99)
TRIGLYCERIDES: 154 mg/dL — AB (ref <150)
VLDL: 31 mg/dL (ref 0–40)

## 2014-05-30 LAB — HEMOGLOBIN A1C
HEMOGLOBIN A1C: 5.5 % (ref <5.7)
MEAN PLASMA GLUCOSE: 111 mg/dL (ref <117)

## 2014-05-31 NOTE — Assessment & Plan Note (Signed)
C/o increased difficulty swallowing , feels as though food is sticking at times, has been happening for over 6 months, needs re eval by GI

## 2014-06-01 ENCOUNTER — Encounter: Payer: Self-pay | Admitting: Gastroenterology

## 2014-06-17 ENCOUNTER — Ambulatory Visit: Payer: BLUE CROSS/BLUE SHIELD | Admitting: Nurse Practitioner

## 2014-07-01 ENCOUNTER — Other Ambulatory Visit: Payer: Self-pay

## 2014-07-01 ENCOUNTER — Encounter: Payer: Self-pay | Admitting: Nurse Practitioner

## 2014-07-01 ENCOUNTER — Ambulatory Visit (INDEPENDENT_AMBULATORY_CARE_PROVIDER_SITE_OTHER): Payer: BLUE CROSS/BLUE SHIELD | Admitting: Nurse Practitioner

## 2014-07-01 VITALS — BP 143/82 | HR 77 | Temp 97.6°F | Ht 74.0 in | Wt 208.2 lb

## 2014-07-01 DIAGNOSIS — R1310 Dysphagia, unspecified: Secondary | ICD-10-CM | POA: Diagnosis not present

## 2014-07-01 DIAGNOSIS — R1314 Dysphagia, pharyngoesophageal phase: Secondary | ICD-10-CM

## 2014-07-01 NOTE — Progress Notes (Signed)
Referring Provider: Fayrene Helper, MD Primary Care Physician:  Tula Nakayama, MD Primary GI:  Dr. Oneida Alar  Chief Complaint  Patient presents with  . Dysphagia    HPI:   54 year old male presents with complaint of dysphagia.  Today he states he has been having dysphagia symptoms for about 5 months. Solid food dysphagia only, no pill dysphagia. Not having regurgitation, can usually coax food bolus to pass with liquids and repeated swallowing. Last episode 4 days ago which "was really bad" which took over 3 minutes to pass. Denies N/V, Denies abdominal pain. Does have esophageal pain with dysphagia episodes. Denies hematochezia or melena. Has GERD symptoms intermittently, not currently on a PPI. Does not take anything for this. Denies NSAIDs and ASA powders. Denies any other upper or lower GI symptoms. Denies fever, chills, unintentional weight loss, chest pain, dyspnea.  Past Medical History  Diagnosis Date  . Hypertension   . High cholesterol   . Substance abuse     h/o excessive alcohol use; pt says he limits use to one to 2 beers daily he currently  . Neck pain   . Back pain   . Abdominal pain   . Adenomatous colon polyp 2008  . Constipation   . Gout   . Hemorrhoids     Past Surgical History  Procedure Laterality Date  . Hand surgery    . Colonoscopy  10/09/2006    3 mm pedunculated sigmoid colon polyp removed/8-mm sessile hepatic flexure polyp (tubular villous adenoma) removed/6-mm  descending colon polyp removed/small internal hemorrhoids  . Colonoscopy  02/28/2011    XUX:YBFXOV, multiple in the rectum/Internal hemorrhoids, MODERATE-CAUSING RECTAL BLEEDING  . Flexible sigmoidoscopy  01/02/2012    SLF: 1. the colonic mucosa appeared normal in the sigmoid colon 2. Large internal hemorrhoids: cause for rectal bleeding/pain     Current Outpatient Prescriptions  Medication Sig Dispense Refill  . allopurinol (ZYLOPRIM) 300 MG tablet Take 1 tablet (300 mg total) by  mouth daily. 30 tablet 6  . aspirin 81 MG tablet Take 1 tablet (81 mg total) by mouth daily. 30 tablet   . lisinopril (PRINIVIL,ZESTRIL) 20 MG tablet TAKE ONE TABLET BY MOUTH ONCE DAILY **DOSE INCREASE** 30 tablet 2  . pravastatin (PRAVACHOL) 40 MG tablet Take 1 tablet (40 mg total) by mouth daily. 90 tablet 1  . predniSONE (STERAPRED UNI-PAK) 5 MG TABS tablet Use as directed 21 tablet 0  . indomethacin (INDOCIN) 25 MG capsule One capsule 3 times daily for 3 days, then one capsule twice daily for 3 days , then one capsule daily for 5 days (Patient not taking: Reported on 07/01/2014) 20 capsule 0  . [DISCONTINUED] lisinopril-hydrochlorothiazide (PRINZIDE,ZESTORETIC) 10-12.5 MG per tablet One tablet once daily Dose increase effective 04/23/2012 30 tablet 5   No current facility-administered medications for this visit.    Allergies as of 07/01/2014  . (No Known Allergies)    Family History  Problem Relation Age of Onset  . Hypertension Mother   . Hypertension Father   . Hypertension Sister   . Hypertension Brother   . Colon cancer Neg Hx     History   Social History  . Marital Status: Legally Separated    Spouse Name: N/A  . Number of Children: 0  . Years of Education: N/A   Occupational History  . unemployed, [place] History Main Topics  . Smoking status: Former Smoker -- 1.00 packs/day for 23 years    Types: Cigarettes  Quit date: 03/13/2004  . Smokeless tobacco: Not on file  . Alcohol Use: 4.8 oz/week    8 Cans of beer per week  . Drug Use: No  . Sexual Activity: Not on file   Other Topics Concern  . None   Social History Narrative    Review of Systems: 10 point ROS negative except as per HPI.   Physical Exam: BP 143/82 mmHg  Pulse 77  Temp(Src) 97.6 F (36.4 C) (Oral)  Ht 6\' 2"  (1.88 m)  Wt 208 lb 3.2 oz (94.439 kg)  BMI 26.72 kg/m2 General:   Alert and oriented. No distress noted. Pleasant and cooperative.  Head:  Normocephalic and  atraumatic. Eyes:  Conjuctiva clear without scleral icterus. Mouth:  Oral mucosa pink and moist. No OP edema. Neck:  Supple, without mass or thyromegaly. Lungs:  Clear to auscultation bilaterally. No wheezes, rales, or rhonchi. No distress.  Heart:  S1, S2 present without murmurs, rubs, or gallops. Regular rate and rhythm. Abdomen:  +BS, soft, non-tender and non-distended. No rebound or guarding. No HSM or masses noted. Extremities:  Without edema. Neurologic:  Alert and  oriented x4;  grossly normal neurologically. Skin:  Intact without significant lesions or rashes. Cervical Nodes:  No significant cervical adenopathy. Psych:  Alert and cooperative. Normal mood and affect.    07/01/2014 10:32 AM

## 2014-07-01 NOTE — Assessment & Plan Note (Signed)
54 year old male presents with a 5 month history of new onset dysphagia with solid foods. Denies pill dysphagia. Denies hematemesis, abdominal pain, nausea, vomiting, diarrhea, hematochezia, or melena. No other red flag/warning signs or symptoms. Has never had an upper endoscopy before. Has intermittent GERD symptoms which she does not take any medication for. Today we'll plan to proceed with a diagnostic upper endoscopy with possible dilation. Possible etiologies include silent GERD related esophagitis but cannot rule out stricture, web, or ring.  Proceed with EGD with Dr. Oneida Alar in near future: the risks, benefits, and alternatives have been discussed with the patient in detail. The patient states understanding and desires to proceed.  Patient is on 81 mg daily aspirin, no other anticoagulants.

## 2014-07-01 NOTE — Progress Notes (Signed)
cc'ed to pcp °

## 2014-07-01 NOTE — Patient Instructions (Signed)
1. We will schedule your procedure (endoscopy with possible dilation) for you. 2. Further recommendations to be based on results of your procedure.

## 2014-07-21 ENCOUNTER — Ambulatory Visit (HOSPITAL_COMMUNITY)
Admission: RE | Admit: 2014-07-21 | Discharge: 2014-07-21 | Disposition: A | Discharge status: Home / Self Care | Payer: BLUE CROSS/BLUE SHIELD | Source: Ambulatory Visit | Attending: Gastroenterology | Admitting: Gastroenterology

## 2014-07-21 ENCOUNTER — Encounter (HOSPITAL_COMMUNITY): Payer: Self-pay | Admitting: *Deleted

## 2014-07-21 ENCOUNTER — Encounter (HOSPITAL_COMMUNITY)
Admission: RE | Disposition: A | Discharge status: Home / Self Care | Payer: Self-pay | Source: Ambulatory Visit | Attending: Gastroenterology

## 2014-07-21 DIAGNOSIS — K2950 Unspecified chronic gastritis without bleeding: Secondary | ICD-10-CM | POA: Insufficient documentation

## 2014-07-21 DIAGNOSIS — K222 Esophageal obstruction: Secondary | ICD-10-CM | POA: Insufficient documentation

## 2014-07-21 DIAGNOSIS — F1910 Other psychoactive substance abuse, uncomplicated: Secondary | ICD-10-CM | POA: Insufficient documentation

## 2014-07-21 DIAGNOSIS — R1310 Dysphagia, unspecified: Secondary | ICD-10-CM | POA: Diagnosis present

## 2014-07-21 DIAGNOSIS — M109 Gout, unspecified: Secondary | ICD-10-CM | POA: Diagnosis not present

## 2014-07-21 DIAGNOSIS — K2970 Gastritis, unspecified, without bleeding: Secondary | ICD-10-CM | POA: Diagnosis not present

## 2014-07-21 DIAGNOSIS — Z87891 Personal history of nicotine dependence: Secondary | ICD-10-CM | POA: Insufficient documentation

## 2014-07-21 DIAGNOSIS — E780 Pure hypercholesterolemia: Secondary | ICD-10-CM | POA: Diagnosis not present

## 2014-07-21 DIAGNOSIS — Z7982 Long term (current) use of aspirin: Secondary | ICD-10-CM | POA: Diagnosis not present

## 2014-07-21 DIAGNOSIS — K5900 Constipation, unspecified: Secondary | ICD-10-CM | POA: Insufficient documentation

## 2014-07-21 DIAGNOSIS — I10 Essential (primary) hypertension: Secondary | ICD-10-CM | POA: Insufficient documentation

## 2014-07-21 DIAGNOSIS — R1314 Dysphagia, pharyngoesophageal phase: Secondary | ICD-10-CM | POA: Diagnosis not present

## 2014-07-21 HISTORY — PX: SAVORY DILATION: SHX5439

## 2014-07-21 HISTORY — PX: ESOPHAGOGASTRODUODENOSCOPY: SHX5428

## 2014-07-21 SURGERY — EGD (ESOPHAGOGASTRODUODENOSCOPY)
Anesthesia: Moderate Sedation

## 2014-07-21 MED ORDER — PANTOPRAZOLE SODIUM 40 MG PO TBEC: DELAYED_RELEASE_TABLET | ORAL | Status: DC

## 2014-07-21 MED ORDER — MEPERIDINE HCL 100 MG/ML IJ SOLN
INTRAMUSCULAR | Status: DC | PRN
  Administered 2014-07-21 (×3): 25 mg via INTRAVENOUS

## 2014-07-21 MED ORDER — LIDOCAINE VISCOUS 2 % MT SOLN
OROMUCOSAL | Status: DC | PRN
  Administered 2014-07-21: 3 mL via OROMUCOSAL

## 2014-07-21 MED ORDER — STERILE WATER FOR IRRIGATION IR SOLN
Status: DC | PRN
  Administered 2014-07-21: 13:00:00

## 2014-07-21 MED ORDER — SODIUM CHLORIDE 0.9 % IV SOLN: INTRAVENOUS | Status: DC

## 2014-07-21 MED ORDER — MEPERIDINE HCL 100 MG/ML IJ SOLN
INTRAMUSCULAR | Status: AC
  Filled 2014-07-21: qty 2

## 2014-07-21 MED ORDER — MIDAZOLAM HCL 5 MG/5ML IJ SOLN
INTRAMUSCULAR | Status: DC | PRN
  Administered 2014-07-21 (×2): 2 mg via INTRAVENOUS
  Administered 2014-07-21: 1 mg via INTRAVENOUS

## 2014-07-21 MED ORDER — LIDOCAINE VISCOUS 2 % MT SOLN
OROMUCOSAL | Status: AC
  Filled 2014-07-21: qty 15

## 2014-07-21 MED ORDER — MINERAL OIL PO OIL
TOPICAL_OIL | ORAL | Status: AC
  Filled 2014-07-21: qty 30

## 2014-07-21 MED ORDER — MIDAZOLAM HCL 5 MG/5ML IJ SOLN
INTRAMUSCULAR | Status: AC
  Filled 2014-07-21: qty 10

## 2014-07-21 NOTE — Op Note (Signed)
[place], [address]   ENDOSCOPY PROCEDURE REPORT  PATIENT: Tristan, Cook  MR#: [id number] BIRTHDATE: 07-03-1960 , 53  yrs. old GENDER: male  ENDOSCOPIST: [name], MD REFFERED RZ:NBVAPOLI Moshe Cipro, M.D.  PROCEDURE DATE:  07-26-2014 PROCEDURE:   EGD with biopsy and EGD with dilatation over guidewire   INDICATIONS:1.  chest pain.   2.  dysphagia.   3.  INTERMITTENT FOOD IMPACTION. USES INDOMETHACIN PRN. HEARTBURN NOT WELL CONTROLLED. MEDICATIONS: Demerol 75 mg IV and Versed 5 mg IV TOPICAL ANESTHETIC: Viscous Xylocaine  DESCRIPTION OF PROCEDURE:   After the risks benefits and alternatives of the procedure were thoroughly explained, informed consent was obtained.  The EG-[id number] ([id number])  endoscope was introduced through the mouth and advanced to the second portion of the duodenum. The instrument was slowly withdrawn as the mucosa was carefully examined.  Prior to withdrawal of the scope, the guidwire was placed.  The esophagus was dilated successfully.  The patient was recovered in endoscopy and discharged home in satisfactory condition.   ESOPHAGUS: A stricture was found at the gastroesophageal junction. STOMACH: Mild erosive gastritis (inflammation) was found in the gastric antrum.  Multiple biopsies were performed using cold forceps.   DUODENUM: The duodenal mucosa showed no abnormalities in the bulb and second portion of the duodenum.   Dilation was then performed at the gastroesphageal junction Dilator: Savary over guidewire Size(s): 12.8-16 MM Resistance: moderate Heme: yes  COMPLICATIONS: There were no immediate complications.  ENDOSCOPIC IMPRESSION: 1.   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   MILD Erosive gastritis MOST LIKELY DUE TO INDOMETHACIN  RECOMMENDATIONS: AVOID TRIGGERS FOR REFLUX. START PROTONIX.  TAKE 30 MINUTES PRIOR TO MEALS TWICE DAILY. AWAIT BIOPSY RESULTS. FOLLOW UP IN 3  MOS.    _______________________________ eSignedDanie Binder, MD 26-Jul-2014 1:10 PM   CPT CODES: ICD CODES:  The ICD and CPT codes recommended by this software are interpretations from the data that the clinical staff has captured with the software.  The verification of the translation of this report to the ICD and CPT codes and modifiers is the sole responsibility of the health care institution and practicing physician where this report was generated.  [facility]. will not be held responsible for the validity of the ICD and CPT codes included on this report.  AMA assumes no liability for data contained or not contained herein. CPT is a Designer, television/film set of the [employer].

## 2014-07-21 NOTE — Progress Notes (Signed)
REVIEWED-NO ADDITIONAL RECOMMENDATIONS. 

## 2014-07-21 NOTE — Discharge Instructions (Signed)
I dilated your esophagus. You have a stricture near the base of your esophagus.  You have EROSIVE gastritis DUE TO INDOMETHACIN USE. I biopsied your stomach.   AVOID TRIGGERS FOR REFLUX. SEE INFO BELOW.  START PROTONIX. TAKE 30 MINUTES PRIOR TO MEALS TWICE DAILY.  YOUR BIOPSY RESULTS WILL BE AVAILABLE IN MY CHART AFTER MAY 12 AND MY OFFICE WILL CONTACT YOU IN 10-14 DAYS WITH YOUR RESULTS.   FOLLOW UP IN 3 MOS.   UPPER ENDOSCOPY AFTER CARE Read the instructions outlined below and refer to this sheet in the next week. These discharge instructions provide you with general information on caring for yourself after you leave the hospital. While your treatment has been planned according to the most current medical practices available, unavoidable complications occasionally occur. If you have any problems or questions after discharge, call DR. Kasiya Burck, [phone].  ACTIVITY  You may resume your regular activity, but move at a slower pace for the next 24 hours.   Take frequent rest periods for the next 24 hours.   Walking will help get rid of the air and reduce the bloated feeling in your belly (abdomen).   No driving for 24 hours (because of the medicine (anesthesia) used during the test).   You may shower.   Do not sign any important legal documents or operate any machinery for 24 hours (because of the anesthesia used during the test).    NUTRITION  Drink plenty of fluids.   You may resume your normal diet as instructed by your doctor.   Begin with a light meal and progress to your normal diet. Heavy or fried foods are harder to digest and may make you feel sick to your stomach (nauseated).   Avoid alcoholic beverages for 24 hours or as instructed.    MEDICATIONS  You may resume your normal medications.   WHAT YOU CAN EXPECT TODAY  Some feelings of bloating in the abdomen.   Passage of more gas than usual.    IF YOU HAD A BIOPSY TAKEN DURING THE UPPER ENDOSCOPY:  Eat  a soft diet IF YOU HAVE NAUSEA, BLOATING, ABDOMINAL PAIN, OR VOMITING.    FINDING OUT THE RESULTS OF YOUR TEST Not all test results are available during your visit. DR. Oneida Alar WILL CALL YOU WITHIN 14 DAYS OF YOUR PROCEDUE WITH YOUR RESULTS. Do not assume everything is normal if you have not heard from DR. Felissa Blouch, CALL HER OFFICE AT [phone].  SEEK IMMEDIATE MEDICAL ATTENTION AND CALL THE OFFICE: [phone] IF:  You have more than a spotting of blood in your stool.   Your belly is swollen (abdominal distention).   You are nauseated or vomiting.   You have a temperature over 101F.   You have abdominal pain or discomfort that is severe or gets worse throughout the day.  Gastritis  Gastritis is an inflammation (the body's way of reacting to injury and/or infection) of the stomach. It is often caused by viral or bacterial (germ) infections. It can also be caused BY ASPIRIN, BC/GOODY POWDER'S, (IBUPROFEN) MOTRIN, OR ALEVE (NAPROXEN), chemicals (including alcohol), SPICY FOODS, and medications. This illness may be associated with generalized malaise (feeling tired, not well), UPPER ABDOMINAL STOMACH cramps, and fever. One common bacterial cause of gastritis is an organism known as H. Pylori. This can be treated with antibiotics.    ESOPHAGEAL STRICTURE  Esophageal strictures can be caused by stomach acid backing up into the tube that carries food from the mouth down to the stomach (lower  esophagus).  TREATMENT There are a number of medicines used to treat reflux/stricture, including: Antacids.  Proton-pump inhibitors: PROTONIX  HOME CARE INSTRUCTIONS Eat 2-3 hours before going to bed.  Try to reach and maintain a healthy weight.  Do not eat just a few very large meals. Instead, eat 4 TO 6 smaller meals throughout the day.  Try to identify foods and beverages that make your symptoms worse, and avoid these.  Avoid tight clothing.  Do not exercise right after eating.   Lifestyle  and home remedies TO HELP CONTROL HEARTBURN.  You may eliminate or reduce the frequency of heartburn by making the following lifestyle changes:   Control your weight. Being overweight is a major risk factor for heartburn and GERD. Excess pounds put pressure on your abdomen, pushing up your stomach and causing acid to back up into your esophagus.    Eat smaller meals. 4 TO 6 MEALS A DAY. This reduces pressure on the lower esophageal sphincter, helping to prevent the valve from opening and acid from washing back into your esophagus.    Loosen your belt. Clothes that fit tightly around your waist put pressure on your abdomen and the lower esophageal sphincter.     Eliminate heartburn triggers. Everyone has specific triggers.     Common triggers such as fatty or fried foods, spicy food, tomato sauce, carbonated beverages, alcohol, chocolate, mint, garlic, onion, caffeine and nicotine may make heartburn worse.    Avoid stooping or bending. Tying your shoes is OK. Bending over for longer periods to PICK UP SOMETING isn't GOOD, especially soon after eating.    Don't lie down after a meal. Wait at least three to four hours after eating before going to bed, and don't lie down right after eating.    PLACE THE HEAD OF YOUR BED ON 6 INCH BLOCKS.  Alternative medicine  Several home remedies exist for treating GERD, but they provide only temporary relief. They include drinking baking soda (sodium bicarbonate) added to water or drinking other fluids such as baking soda mixed with cream of tartar and water.  Although these liquids create temporary relief by neutralizing, washing away or buffering acids, eventually they aggravate the situation by adding gas and fluid to your stomach, increasing pressure and causing more acid reflux. Further, adding more sodium to your diet may increase your blood pressure and add stress to your heart, and excessive bicarbonate ingestion can alter the acid-base  balance in your body.

## 2014-07-21 NOTE — H&P (Signed)
Primary Care Physician:  Tula Nakayama, MD Primary Gastroenterologist:  Dr. Oneida Alar  Pre-Procedure History & Physical: HPI:  Tristan Cook is a 54 y.o. male here for DYSPHAGIA.  Past Medical History  Diagnosis Date  . Hypertension   . High cholesterol   . Substance abuse     h/o excessive alcohol use; pt says he limits use to one to 2 beers daily he currently  . Neck pain   . Back pain   . Abdominal pain   . Adenomatous colon polyp 2008  . Constipation   . Gout   . Hemorrhoids     Past Surgical History  Procedure Laterality Date  . Hand surgery    . Colonoscopy  10/09/2006    3 mm pedunculated sigmoid colon polyp removed/8-mm sessile hepatic flexure polyp (tubular villous adenoma) removed/6-mm  descending colon polyp removed/small internal hemorrhoids  . Colonoscopy  02/28/2011    XAJ:OINOMV, multiple in the rectum/Internal hemorrhoids, MODERATE-CAUSING RECTAL BLEEDING  . Flexible sigmoidoscopy  01/02/2012    SLF: 1. the colonic mucosa appeared normal in the sigmoid colon 2. Large internal hemorrhoids: cause for rectal bleeding/pain     Prior to Admission medications   Medication Sig Start Date End Date Taking? Authorizing Provider  allopurinol (ZYLOPRIM) 300 MG tablet Take 1 tablet (300 mg total) by mouth daily. 01/13/14  Yes Fayrene Helper, MD  aspirin 81 MG tablet Take 1 tablet (81 mg total) by mouth daily. 10/06/13  Yes Fayrene Helper, MD  indomethacin (INDOCIN) 25 MG capsule One capsule 3 times daily for 3 days, then one capsule twice daily for 3 days , then one capsule daily for 5 days 05/29/14  Yes Fayrene Helper, MD  lisinopril (PRINIVIL,ZESTRIL) 20 MG tablet TAKE ONE TABLET BY MOUTH ONCE DAILY **DOSE INCREASE** 05/11/14  Yes Fayrene Helper, MD  pravastatin (PRAVACHOL) 40 MG tablet Take 1 tablet (40 mg total) by mouth daily. 02/23/14  Yes Fayrene Helper, MD  predniSONE (STERAPRED UNI-PAK) 5 MG TABS tablet Use as directed 05/29/14   Fayrene Helper, MD    Allergies as of 07/01/2014  . (No Known Allergies)    Family History  Problem Relation Age of Onset  . Hypertension Mother   . Hypertension Father   . Hypertension Sister   . Hypertension Brother   . Colon cancer Neg Hx     History   Social History  . Marital Status: Legally Separated    Spouse Name: N/A  . Number of Children: 0  . Years of Education: N/A   Occupational History  . unemployed, [place] History Main Topics  . Smoking status: Former Smoker -- 1.00 packs/day for 23 years    Types: Cigarettes    Quit date: 03/13/2004  . Smokeless tobacco: Not on file  . Alcohol Use: No     Comment: Quit 2012  . Drug Use: No  . Sexual Activity: Not on file   Other Topics Concern  . Not on file   Social History Narrative    Review of Systems: See HPI, otherwise negative ROS   Physical Exam: BP 138/78 mmHg  Pulse 72  Temp(Src) 98.1 F (36.7 C) (Oral)  Resp 17  SpO2 100% General:   Alert,  pleasant and cooperative in NAD Head:  Normocephalic and atraumatic. Neck:  Supple; Lungs:  Clear throughout to auscultation.    Heart:  Regular rate and rhythm. Abdomen:  Soft, nontender and nondistended. Normal bowel sounds,  without guarding, and without rebound.   Neurologic:  Alert and  oriented x4;  grossly normal neurologically.  Impression/Plan:    DYSPHAGIA  PLAN:  EGD/DIL TODAY

## 2014-07-22 ENCOUNTER — Encounter (HOSPITAL_COMMUNITY): Payer: Self-pay | Admitting: Gastroenterology

## 2014-07-29 ENCOUNTER — Telehealth: Payer: Self-pay | Admitting: Gastroenterology

## 2014-07-29 NOTE — Telephone Encounter (Signed)
Please call pt. His stomach Bx shows EROSIVE gastritis.    AVOID TRIGGERS FOR REFLUX.   TAKE  PROTONIX 30 MINUTES PRIOR TO MEALS TWICE DAILY.  FOLLOW UP IN 3 MOS E30 GASTRITIS/GERD/DYSPHAGIA.

## 2014-07-29 NOTE — Telephone Encounter (Signed)
PATIENT HAS APPOINTMENT

## 2014-07-29 NOTE — Telephone Encounter (Signed)
LMOM to call.

## 2014-07-30 MED ORDER — LANSOPRAZOLE 30 MG PO CPDR
DELAYED_RELEASE_CAPSULE | ORAL | Status: DC
Start: 2014-07-30 — End: 2014-08-05

## 2014-07-30 NOTE — Addendum Note (Signed)
Addended by: [name] on: 07/30/2014 03:45 PM   Modules accepted: Orders

## 2014-07-30 NOTE — Telephone Encounter (Signed)
PLEASE CALL PT. Sent Rx for Prevacid bid.

## 2014-07-30 NOTE — Telephone Encounter (Signed)
Pt is aware. Said he could not get the Protonix, it will cost him $130.00.  Please advise!

## 2014-07-31 NOTE — Telephone Encounter (Signed)
Called and left the message on the VM that new Rx has been sent in for him.

## 2014-08-04 NOTE — Telephone Encounter (Signed)
PT called and he will have to pay $100.00 for the Prevacid also. He said he just cannot afford that. Please advise!

## 2014-08-05 ENCOUNTER — Other Ambulatory Visit: Payer: Self-pay | Admitting: Gastroenterology

## 2014-08-05 ENCOUNTER — Other Ambulatory Visit: Payer: Self-pay

## 2014-08-05 MED ORDER — OMEPRAZOLE 20 MG PO CPDR: 20.0000 mg | DELAYED_RELEASE_CAPSULE | Freq: Two times a day (BID) | ORAL | Status: DC

## 2014-08-05 NOTE — Addendum Note (Signed)
Addended by: Mahala Menghini on: 08/05/2014 01:22 PM   Modules accepted: Orders

## 2014-08-05 NOTE — Telephone Encounter (Signed)
Routing to refill box in Dr. Nona Dell absence.

## 2014-08-05 NOTE — Telephone Encounter (Addendum)
Try omeprazole 20mg  BID.  I could not tell if patient's insurance covers or not. Have him call if any problems.

## 2014-08-06 NOTE — Telephone Encounter (Signed)
LMOM that the RX was sent in and to call if there is a problem.

## 2014-08-06 NOTE — Telephone Encounter (Signed)
Pt left VM that the pharmacy has faxed papers. Per Almyra Free, the Lansoprazole was approved. I have LMOM for a return call.

## 2014-08-06 NOTE — Telephone Encounter (Signed)
PT said the insurance will not pay for the Omeprazole  bid, they will charge him $50.00 for one month at one qd.  I told him he might just be better off to get it OTC to take bid and he said he will check it out.

## 2014-08-11 NOTE — Telephone Encounter (Signed)
So was the $100 for prevacid prior to the approval??? Let's make sure we have exhausted our options for RX PPI.

## 2014-08-11 NOTE — Telephone Encounter (Signed)
I spoke with Tamika at [facility]. Lansoprazole was $100 even with PA approval.

## 2014-08-12 NOTE — Telephone Encounter (Signed)
PT is aware.

## 2014-08-12 NOTE — Telephone Encounter (Signed)
REVIEWED. PT SHOULD TRY OMEPRAZOLE 30 MINS PRIOR TO MEALS ONCE OR TWICE A DAY.

## 2014-08-31 ENCOUNTER — Telehealth: Payer: Self-pay

## 2014-08-31 NOTE — Telephone Encounter (Signed)
I recommend he let podiatrist order xray and podiatry will need to do work excuse since he is being follwoed by podiatry long term for foot pain

## 2014-08-31 NOTE — Telephone Encounter (Signed)
States he wants an xray of his left foot. No injury but its swollen and feels like there are needles sticking up through the bottom of his left foot. Had to leave work early and wants a work note. Has appt with podiatry on Thursday  [phone]

## 2014-09-01 NOTE — Telephone Encounter (Signed)
Called and relayed message to patient via voicemail.  Will await return call.

## 2014-09-08 ENCOUNTER — Telehealth: Payer: Self-pay

## 2014-09-08 NOTE — Telephone Encounter (Signed)
Pt called office asking to speak to Dr. Oneida Alar about his brother Jenny Reichmann.  States he needs to talk to Dr. Oneida Alar about getting his brother Jenny Reichmann in [facility] care

## 2014-09-08 NOTE — Telephone Encounter (Signed)
Tried to call pt and [facility]

## 2014-09-08 NOTE — Telephone Encounter (Signed)
PLEASE CALL PT'S BROTHER. PT IS ALREADY IN HOSPICE. IF THEY THINK HE NEEDS TO BE MOVED TO HOSPICE HOME. HE JUST NEEDS TO ASK. WILL TRY TO CAL HIM LATER.

## 2014-09-20 ENCOUNTER — Emergency Department (HOSPITAL_COMMUNITY)
Admission: EM | Admit: 2014-09-20 | Discharge: 2014-09-20 | Disposition: A | Discharge status: Home / Self Care | Payer: BLUE CROSS/BLUE SHIELD | Attending: Emergency Medicine | Admitting: Emergency Medicine

## 2014-09-20 ENCOUNTER — Encounter (HOSPITAL_COMMUNITY): Payer: Self-pay

## 2014-09-20 DIAGNOSIS — K122 Cellulitis and abscess of mouth: Secondary | ICD-10-CM | POA: Insufficient documentation

## 2014-09-20 DIAGNOSIS — Z86010 Personal history of colonic polyps: Secondary | ICD-10-CM | POA: Insufficient documentation

## 2014-09-20 DIAGNOSIS — Z791 Long term (current) use of non-steroidal anti-inflammatories (NSAID): Secondary | ICD-10-CM | POA: Diagnosis not present

## 2014-09-20 DIAGNOSIS — Z8719 Personal history of other diseases of the digestive system: Secondary | ICD-10-CM | POA: Insufficient documentation

## 2014-09-20 DIAGNOSIS — E780 Pure hypercholesterolemia: Secondary | ICD-10-CM | POA: Insufficient documentation

## 2014-09-20 DIAGNOSIS — Z79899 Other long term (current) drug therapy: Secondary | ICD-10-CM | POA: Diagnosis not present

## 2014-09-20 DIAGNOSIS — Z87891 Personal history of nicotine dependence: Secondary | ICD-10-CM | POA: Diagnosis not present

## 2014-09-20 DIAGNOSIS — Z7982 Long term (current) use of aspirin: Secondary | ICD-10-CM | POA: Insufficient documentation

## 2014-09-20 DIAGNOSIS — I10 Essential (primary) hypertension: Secondary | ICD-10-CM | POA: Diagnosis not present

## 2014-09-20 DIAGNOSIS — M109 Gout, unspecified: Secondary | ICD-10-CM | POA: Insufficient documentation

## 2014-09-20 DIAGNOSIS — J029 Acute pharyngitis, unspecified: Secondary | ICD-10-CM | POA: Diagnosis present

## 2014-09-20 MED ORDER — CLINDAMYCIN HCL 150 MG PO CAPS
300.0000 mg | ORAL_CAPSULE | Freq: Once | ORAL | Status: AC
  Administered 2014-09-20: 300 mg via ORAL
  Filled 2014-09-20: qty 2

## 2014-09-20 MED ORDER — CLINDAMYCIN HCL 300 MG PO CAPS: 300.0000 mg | ORAL_CAPSULE | Freq: Four times a day (QID) | ORAL | Status: DC

## 2014-09-20 MED ORDER — PREDNISONE 10 MG PO TABS
60.0000 mg | ORAL_TABLET | Freq: Once | ORAL | Status: AC
  Administered 2014-09-20: 60 mg via ORAL
  Filled 2014-09-20 (×2): qty 1

## 2014-09-20 MED ORDER — PREDNISONE 50 MG PO TABS: ORAL_TABLET | ORAL | Status: DC

## 2014-09-20 NOTE — ED Notes (Signed)
Sore throat and headache per pt.

## 2014-09-20 NOTE — ED Provider Notes (Signed)
CSN: [id number]     Arrival date & time 09/20/14  7989 History   First MD Initiated Contact with Patient 09/20/14 0540     Chief Complaint  Patient presents with  . Sore Throat    Patient is a 54 y.o. male presenting with pharyngitis. The history is provided by the patient.  Sore Throat This is a new problem. The current episode started 12 to 24 hours ago. The problem occurs constantly. The problem has been gradually worsening. Associated symptoms include headaches. Pertinent negatives include no chest pain, no abdominal pain and no shortness of breath. The symptoms are relieved by rest.  pt reports cough, sore throat, myalgias, headache for past day.  He reports fever, tmax 101 No vomiting He reports diarrhea  No drooling or difficulty swallowing No tongue/lip swelling   Past Medical History  Diagnosis Date  . Hypertension   . High cholesterol   . Substance abuse     h/o excessive alcohol use; pt says he limits use to one to 2 beers daily he currently  . Neck pain   . Back pain   . Abdominal pain   . Adenomatous colon polyp 2008  . Constipation   . Gout   . Hemorrhoids    Past Surgical History  Procedure Laterality Date  . Hand surgery    . Colonoscopy  10/09/2006    3 mm pedunculated sigmoid colon polyp removed/8-mm sessile hepatic flexure polyp (tubular villous adenoma) removed/6-mm  descending colon polyp removed/small internal hemorrhoids  . Colonoscopy  02/28/2011    QJJ:HERDEY, multiple in the rectum/Internal hemorrhoids, MODERATE-CAUSING RECTAL BLEEDING  . Flexible sigmoidoscopy  01/02/2012    SLF: 1. the colonic mucosa appeared normal in the sigmoid colon 2. Large internal hemorrhoids: cause for rectal bleeding/pain   . Esophagogastroduodenoscopy N/A 07/21/2014    Procedure: ESOPHAGOGASTRODUODENOSCOPY (EGD);  Surgeon: [name], MD;  Location: [facility];  Service: Endoscopy;  Laterality: N/A;  215pm - moved to 11:40 - office notified pt  . Savory dilation  N/A 07/21/2014    Procedure: SAVORY DILATION;  Surgeon: [name], MD;  Location: [facility];  Service: Endoscopy;  Laterality: N/A;   Family History  Problem Relation Age of Onset  . Hypertension Mother   . Hypertension Father   . Hypertension Sister   . Hypertension Brother   . Colon cancer Neg Hx    History  Substance Use Topics  . Smoking status: Former Smoker -- 1.00 packs/day for 23 years    Types: Cigarettes    Quit date: 03/13/2004  . Smokeless tobacco: Not on file  . Alcohol Use: No     Comment: Quit 2012    Review of Systems  Constitutional: Positive for fever and chills.  HENT: Positive for sore throat. Negative for drooling.   Respiratory: Negative for shortness of breath.   Cardiovascular: Negative for chest pain.  Gastrointestinal: Negative for abdominal pain.  Neurological: Positive for headaches.  All other systems reviewed and are negative.     Allergies  Review of patient's allergies indicates no known allergies.  Home Medications   Prior to Admission medications   Medication Sig Start Date End Date Taking? Authorizing Provider  allopurinol (ZYLOPRIM) 300 MG tablet Take 1 tablet (300 mg total) by mouth daily. 01/13/14  Yes Fayrene Helper, MD  aspirin 81 MG tablet Take 1 tablet (81 mg total) by mouth daily. 10/06/13  Yes Fayrene Helper, MD  indomethacin (INDOCIN) 25 MG capsule One capsule  3 times daily for 3 days, then one capsule twice daily for 3 days , then one capsule daily for 5 days 05/29/14  Yes Fayrene Helper, MD  lisinopril (PRINIVIL,ZESTRIL) 20 MG tablet TAKE ONE TABLET BY MOUTH ONCE DAILY **DOSE INCREASE** 05/11/14  Yes Fayrene Helper, MD  omeprazole (PRILOSEC) 20 MG capsule Take 1 capsule (20 mg total) by mouth 2 (two) times daily before a meal. 08/05/14  Yes Mahala Menghini, PA-C  clindamycin (CLEOCIN) 300 MG capsule Take 1 capsule (300 mg total) by mouth 4 (four) times daily. X 7 days 09/20/14   Ripley Fraise, MD   pravastatin (PRAVACHOL) 40 MG tablet Take 1 tablet (40 mg total) by mouth daily. 02/23/14   Fayrene Helper, MD  predniSONE (DELTASONE) 50 MG tablet One tablet PO daily for 4 days 09/20/14   Ripley Fraise, MD   BP 153/92 mmHg  Pulse 104  Temp(Src) 98 F (36.7 C) (Oral)  Resp 22  SpO2 100% Physical Exam CONSTITUTIONAL: Well developed/well nourished HEAD: Normocephalic/atraumatic EYES: EOMI/PERRL ENMT: Mucous membranes moist, no anterior lymphadenopathy Uvula midline but it is edematous.  He has erythema to posterior oropharynx.  There are no exudates.  No tongue/lip swelling.  No stridor or drooling noted. NECK: supple no meningeal signs SPINE/BACK:entire spine nontender CV: S1/S2 noted, no murmurs/rubs/gallops noted LUNGS: Lungs are clear to auscultation bilaterally, no apparent distress ABDOMEN: soft, nontender, no rebound or guarding, bowel sounds noted throughout abdomen GU:no cva tenderness NEURO: Pt is awake/alert/appropriate, moves all extremitiesx4.  No facial droop.   EXTREMITIES: pulses normal/equal, full ROM SKIN: warm, color normal PSYCH: no abnormalities of mood noted, alert and oriented to situation  ED Course  Procedures  Pt with recent infectious symptoms.  He appears to have uvulitis.  Will start prednisone/clindamycin He is nontoxic No drooling/stridor He is well appearing He is on lisinopril which can cause edema but suspect this is infectious etiology however he will hold lisinopril while taking his new meds for this acute episode  We discussed strict ER return precautions  MDM   Final diagnoses:  Uvulitis    Nursing notes including past medical history and social history reviewed and considered in documentation     Ripley Fraise, MD 09/20/14 [phone]

## 2014-10-22 ENCOUNTER — Ambulatory Visit (INDEPENDENT_AMBULATORY_CARE_PROVIDER_SITE_OTHER): Payer: BLUE CROSS/BLUE SHIELD | Admitting: Gastroenterology

## 2014-10-22 ENCOUNTER — Encounter: Payer: Self-pay | Admitting: Gastroenterology

## 2014-10-22 VITALS — BP 122/78 | HR 93 | Temp 98.3°F | Ht 74.0 in | Wt 197.0 lb

## 2014-10-22 DIAGNOSIS — K2100 Gastro-esophageal reflux disease with esophagitis, without bleeding: Secondary | ICD-10-CM

## 2014-10-22 DIAGNOSIS — K222 Esophageal obstruction: Secondary | ICD-10-CM

## 2014-10-22 DIAGNOSIS — R197 Diarrhea, unspecified: Secondary | ICD-10-CM | POA: Diagnosis not present

## 2014-10-22 NOTE — Patient Instructions (Signed)
If you have persistent diarrhea, submit stools to the labs. Continue omeprazole at least once daily for now.  Return to the office in six months or call sooner if needed.

## 2014-10-22 NOTE — Progress Notes (Signed)
      Primary Care Physician: Tula Nakayama, MD  Primary Gastroenterologist:  Barney Drain, MD   Chief Complaint  Patient presents with  . Follow-up    HPI: Tristan Cook is a 54 y.o. male here for follow-up of EGD with dilation performed for dysphagia, GERD, chest pain. Had a peptic stricture at the GE junction. Status post dilation. Mild erosive gastritis, biopsy benign. Started on PPI therapy. Currently on Prilosec OTC 1-2 times daily. Really has had no issues with heartburn. Swallowing much improved. Denies any abdominal pain, vomiting. Notably his weight is down 10-15 pounds over the past 4-5 months. He states some of it is related to stress from being on a work related to a foot fracture, death of his brother as well. He states that his weight has been stable more recently. Appetite returning. Over the past week or so he's had diarrhea, multiple loose stools daily. No blood in the stool or melena. Denies any ill contacts or recent [place] use. States he's been limiting his alcohol consumption although 4 days ago he decided to not drink anymore.  Looking for to go back to work on Monday having been out of work for over one month.   Current Outpatient Prescriptions  Medication Sig Dispense Refill  . allopurinol (ZYLOPRIM) 300 MG tablet Take 1 tablet (300 mg total) by mouth daily. (Patient taking differently: Take 300 mg by mouth daily. As needed) 30 tablet 6  . aspirin 81 MG tablet Take 1 tablet (81 mg total) by mouth daily. 30 tablet   . lisinopril (PRINIVIL,ZESTRIL) 20 MG tablet TAKE ONE TABLET BY MOUTH ONCE DAILY **DOSE INCREASE** (Patient taking differently: Takes one half of 20 mg pill daily) 30 tablet 2  . omeprazole (PRILOSEC) 20 MG capsule Take 1 capsule (20 mg total) by mouth 2 (two) times daily before a meal. (Patient not taking: Reported on 10/22/2014) 60 capsule 3  . [DISCONTINUED] lisinopril-hydrochlorothiazide (PRINZIDE,ZESTORETIC) 10-12.5 MG per tablet One tablet  once daily Dose increase effective 04/23/2012 30 tablet 5   No current facility-administered medications for this visit.    Allergies as of 10/22/2014  . (No Known Allergies)    ROS:  General: Negative for anorexia, weight loss, fever, chills, fatigue, weakness. ENT: Negative for hoarseness, difficulty swallowing , nasal congestion. CV: Negative for chest pain, angina, palpitations, dyspnea on exertion, peripheral edema.  Respiratory: Negative for dyspnea at rest, dyspnea on exertion, cough, sputum, wheezing.  GI: See history of present illness. GU:  Negative for dysuria, hematuria, urinary incontinence, urinary frequency, nocturnal urination.  Endo: Negative for unusual weight change.    Physical Examination:   BP 122/78 mmHg  Pulse 93  Temp(Src) 98.3 F (36.8 C) (Oral)  Ht 6\' 2"  (1.88 m)  Wt 197 lb (89.359 kg)  BMI 25.28 kg/m2  General: Well-nourished, well-developed in no acute distress.  Eyes: No icterus. Mouth: Oropharyngeal mucosa moist and pink , no lesions erythema or exudate. Lungs: Clear to auscultation bilaterally.  Heart: Regular rate and rhythm, no murmurs rubs or gallops.  Abdomen: Bowel sounds are normal, nontender, nondistended, no hepatosplenomegaly or masses, no abdominal bruits or hernia , no rebound or guarding.   Extremities: No lower extremity edema. No clubbing or deformities. Neuro: Alert and oriented x 4   Skin: Warm and dry, no jaundice.   Psych: Alert and cooperative, normal mood and affect.

## 2014-10-22 NOTE — Assessment & Plan Note (Signed)
Erosive reflux esophagitis with peptic stricture status post dilation. Now on PPI therapy. Doing well clinically. Return to the office in 6 months or call sooner if needed.

## 2014-10-22 NOTE — Assessment & Plan Note (Addendum)
Acute onset diarrhea. Check stools if persistent. Likely self-limiting.

## 2014-10-22 NOTE — Progress Notes (Signed)
CC'ED TO PCP 

## 2014-10-29 ENCOUNTER — Ambulatory Visit (INDEPENDENT_AMBULATORY_CARE_PROVIDER_SITE_OTHER): Payer: BLUE CROSS/BLUE SHIELD | Admitting: Family Medicine

## 2014-10-29 ENCOUNTER — Encounter: Payer: Self-pay | Admitting: Family Medicine

## 2014-10-29 VITALS — BP 130/76 | HR 78 | Resp 18 | Ht 74.0 in | Wt 200.0 lb

## 2014-10-29 DIAGNOSIS — R7302 Impaired glucose tolerance (oral): Secondary | ICD-10-CM

## 2014-10-29 DIAGNOSIS — Z1159 Encounter for screening for other viral diseases: Secondary | ICD-10-CM

## 2014-10-29 DIAGNOSIS — Z125 Encounter for screening for malignant neoplasm of prostate: Secondary | ICD-10-CM

## 2014-10-29 DIAGNOSIS — K2100 Gastro-esophageal reflux disease with esophagitis, without bleeding: Secondary | ICD-10-CM

## 2014-10-29 DIAGNOSIS — E785 Hyperlipidemia, unspecified: Secondary | ICD-10-CM

## 2014-10-29 DIAGNOSIS — M10079 Idiopathic gout, unspecified ankle and foot: Secondary | ICD-10-CM

## 2014-10-29 DIAGNOSIS — E790 Hyperuricemia without signs of inflammatory arthritis and tophaceous disease: Secondary | ICD-10-CM

## 2014-10-29 DIAGNOSIS — I10 Essential (primary) hypertension: Secondary | ICD-10-CM | POA: Diagnosis not present

## 2014-10-29 DIAGNOSIS — M109 Gout, unspecified: Secondary | ICD-10-CM

## 2014-10-29 DIAGNOSIS — E663 Overweight: Secondary | ICD-10-CM

## 2014-10-29 MED ORDER — PRAVASTATIN SODIUM 40 MG PO TABS: 40.0000 mg | ORAL_TABLET | Freq: Every day | ORAL | Status: DC

## 2014-10-29 NOTE — Patient Instructions (Signed)
CPE March 19 or after   It is important that you exercise regularly at least 30 minutes 5 times a week. If you develop chest pain, have severe difficulty breathing, or feel very tired, stop exercising immediately and seek medical attention    Blood pressure is good  Take cholesterol med every day  Fasting labs Nov 6 or after   Weight loss goal ogf 5 top 10 pounds  Thanks for choosing [facility], we consider it a privelige to serve you.

## 2014-10-30 NOTE — Assessment & Plan Note (Signed)
Controlled, no change in medication DASH diet and commitment to daily physical activity for a minimum of 30 minutes discussed and encouraged, as a part of hypertension management. The importance of attaining a healthy weight is also discussed.  BP/Weight 10/29/2014 10/22/2014 09/20/2014 07/21/2014 07/01/2014 05/29/2014 17/91/5056  Systolic BP 979 480 165 537 482 707 867  Diastolic BP 76 78 92 86 82 84 70  Wt. (Lbs) 200 197 - - 208.2 213 214  BMI 25.67 25.28 - - 26.72 27.34 27.46

## 2014-10-30 NOTE — Assessment & Plan Note (Signed)
No established flare in past 8 monhs, on no allopurinol and most recent uric acid normal

## 2014-10-30 NOTE — Assessment & Plan Note (Signed)
Patient educated about the importance of limiting  Carbohydrate intake , the need to commit to daily physical activity for a minimum of 30 minutes , and to commit weight loss. The fact that changes in all these areas will reduce or eliminate all together the development of diabetes is stressed.   Diabetic Labs Latest Ref Rng 05/29/2014 01/15/2014 11/04/2013 04/29/2012 10/17/2011  HbA1c <5.7 % 5.5 5.8(H) - - -  Chol 0 - 200 mg/dL 158 212(H) - 284(H) -  HDL >=40 mg/dL 56 73 - 39(L) -  Calc LDL 0 - 99 mg/dL 71 111(H) - - -  Triglycerides <150 mg/dL 154(H) 138 - 631(H) -  Creatinine 0.50 - 1.35 mg/dL 1.19 1.23 1.39(H) 1.26 1.21   BP/Weight 10/29/2014 10/22/2014 09/20/2014 07/21/2014 07/01/2014 05/29/2014 86/38/1771  Systolic BP 165 790 383 338 329 191 660  Diastolic BP 76 78 92 86 82 84 70  Wt. (Lbs) 200 197 - - 208.2 213 214  BMI 25.67 25.28 - - 26.72 27.34 27.46   No flowsheet data found.  Updated lab needed at/ before next visit.

## 2014-10-30 NOTE — Assessment & Plan Note (Signed)
Improved , uses PPI as needed currently

## 2014-10-30 NOTE — Assessment & Plan Note (Signed)
Deteriorated. Patient re-educated about  the importance of commitment to a  minimum of 150 minutes of exercise per week.  The importance of healthy food choices with portion control discussed. Encouraged to start a food diary, count calories and to consider  joining a support group. Sample diet sheets offered. Goals set by the patient for the next several months.   Weight /BMI 10/29/2014 10/22/2014 07/01/2014  WEIGHT 200 lb 197 lb 208 lb 3.2 oz  HEIGHT 6\' 2"  6\' 2"  6\' 2"   BMI 25.67 kg/m2 25.28 kg/m2 26.72 kg/m2    Current exercise per week 120  minutes.

## 2014-10-30 NOTE — Progress Notes (Signed)
Tristan Cook     MRN: [id number]      DOB: [birth year]   HPI Tristan Cook is here for follow up and re-evaluation of chronic medical conditions, medication management and review of any available recent lab and radiology data.  Preventive health is updated, specifically  Cancer screening and Immunization.   Questions or concerns regarding consultations or procedures which the PT has had in the interim are  Addressed. Sustained a fracture of his left foot  Following an accident in his yard, and was out of work x 7 weeks, returned end June. No recurrent gout flares and on no allopurinol The PT denies any adverse reactions to current medications since the last visit.  There are no new concerns.  There are no specific complaints   ROS Denies recent fever or chills. Denies sinus pressure, nasal congestion, ear pain or sore throat. Denies chest congestion, productive cough or wheezing. Denies chest pains, palpitations and leg swelling Denies abdominal pain, nausea, vomiting,diarrhea or constipation.   Denies dysuria, frequency, hesitancy or incontinence. Denies joint pain, swelling and limitation in mobility. Denies headaches, seizures, numbness, or tingling. Denies depression, anxiety or insomnia. Denies skin break down or rash.   PE  BP 130/76 mmHg  Pulse 78  Resp 18  Ht 6\' 2"  (1.88 m)  Wt 200 lb (90.719 kg)  BMI 25.67 kg/m2  SpO2 100%  Patient alert and oriented and in no cardiopulmonary distress.  HEENT: No facial asymmetry, EOMI,   oropharynx pink and moist.  Neck supple no JVD, no mass.  Chest: Clear to auscultation bilaterally.  CVS: S1, S2 no murmurs, no S3.Regular rate.  ABD: Soft non tender.   Ext: No edema  MS: Adequate ROM spine, shoulders, hips and knees.  Skin: Intact, no ulcerations or rash noted.  Psych: Good eye contact, normal affect. Memory intact not anxious or depressed appearing.  CNS: CN 2-12 intact, power,  normal throughout.no focal deficits  noted.   Assessment & Plan   Essential hypertension Controlled, no change in medication DASH diet and commitment to daily physical activity for a minimum of 30 minutes discussed and encouraged, as a part of hypertension management. The importance of attaining a healthy weight is also discussed.  BP/Weight 10/29/2014 10/22/2014 09/20/2014 07/21/2014 07/01/2014 05/29/2014 89/37/3428  Systolic BP 768 115 726 203 559 741 638  Diastolic BP 76 78 92 86 82 84 70  Wt. (Lbs) 200 197 - - 208.2 213 214  BMI 25.67 25.28 - - 26.72 27.34 27.46        GERD (gastroesophageal reflux disease) Improved , uses PPI as needed currently  IGT (impaired glucose tolerance) Patient educated about the importance of limiting  Carbohydrate intake , the need to commit to daily physical activity for a minimum of 30 minutes , and to commit weight loss. The fact that changes in all these areas will reduce or eliminate all together the development of diabetes is stressed.   Diabetic Labs Latest Ref Rng 05/29/2014 01/15/2014 11/04/2013 04/29/2012 10/17/2011  HbA1c <5.7 % 5.5 5.8(H) - - -  Chol 0 - 200 mg/dL 158 212(H) - 284(H) -  HDL >=40 mg/dL 56 73 - 39(L) -  Calc LDL 0 - 99 mg/dL 71 111(H) - - -  Triglycerides <150 mg/dL 154(H) 138 - 631(H) -  Creatinine 0.50 - 1.35 mg/dL 1.19 1.23 1.39(H) 1.26 1.21   BP/Weight 10/29/2014 10/22/2014 09/20/2014 07/21/2014 07/01/2014 05/29/2014 45/36/4680  Systolic BP 321 224 825 003 704 888 916  Diastolic  BP 76 78 92 86 82 84 70  Wt. (Lbs) 200 197 - - 208.2 213 214  BMI 25.67 25.28 - - 26.72 27.34 27.46   No flowsheet data found.  Updated lab needed at/ before next visit.      Hyperlipidemia LDL goal <100 Hyperlipidemia:Low fat diet discussed and encouraged.   Lipid Panel  Lab Results  Component Value Date   CHOL 158 05/29/2014   HDL 56 05/29/2014   LDLCALC 71 05/29/2014   LDLDIRECT 63 04/29/2012   TRIG 154* 05/29/2014   CHOLHDL 2.8 05/29/2014      Updated lab needed  at/ before next visit.   Overweight (BMI 25.0-29.9) Deteriorated. Patient re-educated about  the importance of commitment to a  minimum of 150 minutes of exercise per week.  The importance of healthy food choices with portion control discussed. Encouraged to start a food diary, count calories and to consider  joining a support group. Sample diet sheets offered. Goals set by the patient for the next several months.   Weight /BMI 10/29/2014 10/22/2014 07/01/2014  WEIGHT 200 lb 197 lb 208 lb 3.2 oz  HEIGHT 6\' 2"  6\' 2"  6\' 2"   BMI 25.67 kg/m2 25.28 kg/m2 26.72 kg/m2    Current exercise per week 120  minutes.   Gout No established flare in past 8 monhs, on no allopurinol and most recent uric acid normal

## 2014-10-30 NOTE — Assessment & Plan Note (Signed)
Hyperlipidemia:Low fat diet discussed and encouraged.   Lipid Panel  Lab Results  Component Value Date   CHOL 158 05/29/2014   HDL 56 05/29/2014   LDLCALC 71 05/29/2014   LDLDIRECT 63 04/29/2012   TRIG 154* 05/29/2014   CHOLHDL 2.8 05/29/2014      Updated lab needed at/ before next visit.

## 2014-11-04 ENCOUNTER — Other Ambulatory Visit: Payer: Self-pay

## 2014-11-04 MED ORDER — PRAVASTATIN SODIUM 40 MG PO TABS: 40.0000 mg | ORAL_TABLET | Freq: Every day | ORAL | Status: DC

## 2014-11-04 MED ORDER — LISINOPRIL 20 MG PO TABS: 20.0000 mg | ORAL_TABLET | Freq: Every day | ORAL | Status: DC

## 2014-11-04 MED ORDER — OMEPRAZOLE 20 MG PO CPDR: 20.0000 mg | DELAYED_RELEASE_CAPSULE | Freq: Two times a day (BID) | ORAL | Status: DC

## 2014-12-14 ENCOUNTER — Ambulatory Visit (INDEPENDENT_AMBULATORY_CARE_PROVIDER_SITE_OTHER): Payer: BLUE CROSS/BLUE SHIELD

## 2014-12-14 ENCOUNTER — Other Ambulatory Visit: Payer: Self-pay

## 2014-12-14 DIAGNOSIS — Z23 Encounter for immunization: Secondary | ICD-10-CM | POA: Diagnosis not present

## 2014-12-14 MED ORDER — LISINOPRIL 20 MG PO TABS: 20.0000 mg | ORAL_TABLET | Freq: Every day | ORAL | Status: DC

## 2014-12-28 ENCOUNTER — Telehealth: Payer: Self-pay

## 2014-12-28 MED ORDER — PREDNISONE 5 MG PO TABS: 5.0000 mg | ORAL_TABLET | Freq: Two times a day (BID) | ORAL | Status: DC

## 2014-12-28 MED ORDER — PROMETHAZINE-DM 6.25-15 MG/5ML PO SYRP: 5.0000 mL | ORAL_SOLUTION | Freq: Every evening | ORAL | Status: DC | PRN

## 2014-12-28 MED ORDER — BENZONATATE 100 MG PO CAPS: 100.0000 mg | ORAL_CAPSULE | Freq: Two times a day (BID) | ORAL | Status: DC | PRN

## 2014-12-28 NOTE — Telephone Encounter (Signed)
States he has been congested in his chest x 1-2 weeks. Unable to cough up mucus. No fever or chills. Has been using mucinex with no relief. No wheezing or SOB. Please advise

## 2014-12-28 NOTE — Telephone Encounter (Signed)
pls send tessalon perle 100 mg twice daily a s needed # 20 only, pred 5 mg twice daily # 10 , and phenergan dm one tsp at bedtime as needed x 118 cc

## 2014-12-28 NOTE — Addendum Note (Signed)
Addended by: Eual Fines on: 12/28/2014 01:22 PM   Modules accepted: Orders

## 2014-12-28 NOTE — Telephone Encounter (Signed)
Left message on voicemail and sent in meds

## 2015-01-20 LAB — COMPLETE METABOLIC PANEL WITH GFR
ALT: 6 U/L — ABNORMAL LOW (ref 9–46)
AST: 16 U/L (ref 10–35)
Albumin: 3.9 g/dL (ref 3.6–5.1)
Alkaline Phosphatase: 67 U/L (ref 40–115)
BUN: 10 mg/dL (ref 7–25)
CHLORIDE: 106 mmol/L (ref 98–110)
CO2: 26 mmol/L (ref 20–31)
Calcium: 9.4 mg/dL (ref 8.6–10.3)
Creat: 1.29 mg/dL (ref 0.70–1.33)
GFR, Est African American: 73 mL/min (ref >=60)
GFR, Est Non African American: 63 mL/min (ref >=60)
Glucose, Bld: 94 mg/dL (ref 65–99)
POTASSIUM: 4.6 mmol/L (ref 3.5–5.3)
Sodium: 140 mmol/L (ref 135–146)
Total Bilirubin: 0.9 mg/dL (ref 0.2–1.2)
Total Protein: 6.9 g/dL (ref 6.1–8.1)

## 2015-01-20 LAB — URIC ACID: URIC ACID, SERUM: 10.8 mg/dL — AB (ref 4.0–7.8)

## 2015-01-20 LAB — HEMOGLOBIN A1C
HEMOGLOBIN A1C: 5.1 % (ref <5.7)
MEAN PLASMA GLUCOSE: 100 mg/dL (ref <117)

## 2015-01-20 LAB — LIPID PANEL
CHOL/HDL RATIO: 5.2 ratio — AB (ref <=5.0)
Cholesterol: 268 mg/dL — ABNORMAL HIGH (ref 125–200)
HDL: 52 mg/dL (ref >=40)
LDL Cholesterol: 158 mg/dL — ABNORMAL HIGH (ref <130)
TRIGLYCERIDES: 290 mg/dL — AB (ref <150)
VLDL: 58 mg/dL — AB (ref <30)

## 2015-01-20 LAB — CBC
HEMATOCRIT: 40.9 % (ref 39.0–52.0)
HEMOGLOBIN: 14.5 g/dL (ref 13.0–17.0)
MCH: 31.8 pg (ref 26.0–34.0)
MCHC: 35.5 g/dL (ref 30.0–36.0)
MCV: 89.7 fL (ref 78.0–100.0)
MPV: 9.7 fL (ref 8.6–12.4)
Platelets: 359 10*3/uL (ref 150–400)
RBC: 4.56 MIL/uL (ref 4.22–5.81)
RDW: 14.6 % (ref 11.5–15.5)
WBC: 4.3 10*3/uL (ref 4.0–10.5)

## 2015-01-20 LAB — HEPATITIS C ANTIBODY: HCV Ab: NEGATIVE

## 2015-01-20 LAB — TSH: TSH: 0.907 u[IU]/mL (ref 0.350–4.500)

## 2015-01-21 LAB — PSA: PSA: 0.4 ng/mL (ref <=4.00)

## 2015-01-29 MED ORDER — ALLOPURINOL 300 MG PO TABS: 300.0000 mg | ORAL_TABLET | Freq: Every day | ORAL | Status: DC

## 2015-01-29 MED ORDER — PRAVASTATIN SODIUM 80 MG PO TABS: 80.0000 mg | ORAL_TABLET | Freq: Every day | ORAL | Status: DC

## 2015-01-29 NOTE — Addendum Note (Signed)
Addended by: Denman George B on: 01/29/2015 03:01 PM   Modules accepted: Orders

## 2015-01-29 NOTE — Addendum Note (Signed)
Addended by: Denman George B on: 01/29/2015 11:12 AM   Modules accepted: Orders

## 2015-02-01 ENCOUNTER — Telehealth: Payer: Self-pay

## 2015-02-08 NOTE — Telephone Encounter (Signed)
pls check his formulary as able, send lipitor 40 mg daily x 4 month if on formularyl, if not pls let me know what is! if too expensive again, explain the need for him to directly request formulary from his ins, and offer lovastatin 40mg  if on $4 list  Also needs to change diet as high chol inc risk of stroke and MI, he MAY NEED to sacrifice $25 per month to help his health if hat is his cheapest option, but I would think he has lower option,

## 2015-02-10 ENCOUNTER — Ambulatory Visit (INDEPENDENT_AMBULATORY_CARE_PROVIDER_SITE_OTHER): Payer: BLUE CROSS/BLUE SHIELD | Admitting: Family Medicine

## 2015-02-10 ENCOUNTER — Encounter: Payer: Self-pay | Admitting: Family Medicine

## 2015-02-10 VITALS — BP 136/80 | HR 68 | Resp 18 | Ht 74.0 in | Wt 207.0 lb

## 2015-02-10 DIAGNOSIS — E785 Hyperlipidemia, unspecified: Secondary | ICD-10-CM

## 2015-02-10 DIAGNOSIS — I10 Essential (primary) hypertension: Secondary | ICD-10-CM | POA: Diagnosis not present

## 2015-02-10 DIAGNOSIS — M10071 Idiopathic gout, right ankle and foot: Secondary | ICD-10-CM

## 2015-02-10 DIAGNOSIS — J309 Allergic rhinitis, unspecified: Secondary | ICD-10-CM | POA: Insufficient documentation

## 2015-02-10 DIAGNOSIS — E663 Overweight: Secondary | ICD-10-CM

## 2015-02-10 DIAGNOSIS — J3089 Other allergic rhinitis: Secondary | ICD-10-CM | POA: Diagnosis not present

## 2015-02-10 MED ORDER — LORATADINE 10 MG PO TABS: 10.0000 mg | ORAL_TABLET | Freq: Every day | ORAL | Status: DC

## 2015-02-10 MED ORDER — LOVASTATIN 40 MG PO TABS: 40.0000 mg | ORAL_TABLET | Freq: Every day | ORAL | Status: DC

## 2015-02-10 MED ORDER — METHYLPREDNISOLONE ACETATE 80 MG/ML IJ SUSP
80.0000 mg | Freq: Once | INTRAMUSCULAR | Status: AC
  Administered 2015-02-10: 80 mg via INTRAMUSCULAR

## 2015-02-10 MED ORDER — PREDNISONE 5 MG (21) PO TBPK: 5.0000 mg | ORAL_TABLET | ORAL | Status: DC

## 2015-02-10 NOTE — Progress Notes (Signed)
Subjective:    Patient ID: Tristan Cook, male    DOB: February 15, 1961, 54 y.o.   MRN: [id number]  HPI  1 week h/o worsening head and chest congestion, associated no  fever and chills . Nasal drainage has thickened , and is yellowish green, and at times bloody. Took OTC meds this past weekend , which have not improved the symptoms. C/o bilateral ear pressure, denies hearing loss and sore throat.  No improvement with OTC medication.  Right foot pain  Started today   Review of Systems See HPI  Denies chest congestion, productive cough or wheezing. Denies chest pains, palpitations and leg swelling Denies abdominal pain, nausea, vomiting,diarrhea or constipation.   Denies dysuria, frequency, hesitancy or incontinence. Denies headaches, seizures, numbness, or tingling. Denies depression, anxiety or insomnia. Denies skin break down or rash.        Objective:   Physical Exam BP 136/80 mmHg  Pulse 68  Resp 18  Ht 6\' 2"  (1.88 m)  Wt 207 lb (93.895 kg)  BMI 26.57 kg/m2  SpO2 98% Patient alert and oriented and in no cardiopulmonary distress.  HEENT: No facial asymmetry, EOMI,   oropharynx pink and moist.  Neck supple no JVD, no mass. Nasal mucosa erythematous and edematous with clear drainage, TM clear, no sinus tenderness Chest: Clear to auscultation bilaterally.  CVS: S1, S2 no murmurs, no S3.Regular rate.  ABD: Soft non tender.   Ext: No edema  MS: Adequate ROM spine, shoulders, hips and knees.Tendeer over right great toe , which is swollen and warm  Skin: Intact, no ulcerations or rash noted.  Psych: Good eye contact, normal affect. Memory intact not anxious or depressed appearing.  CNS: CN 2-12 intact, power,  normal throughout.no focal deficits noted.        Assessment & Plan:  Allergic rhinitis Uncontrolled, prednisone dose pack and commitment to daily medication for symptom control, also encouraged to do saline flushes twice daily  Essential  hypertension Controlled, no change in medication DASH diet and commitment to daily physical activity for a minimum of 30 minutes discussed and encouraged, as a part of hypertension management. The importance of attaining a healthy weight is also discussed.  BP/Weight 03/29/2015 03/04/2015 02/10/2015 10/29/2014 10/22/2014 09/20/2014 Q000111Q  Systolic BP Q000111Q [id number] 0000000 0000000  Diastolic BP 80 80 80 76 78 92 86  Wt. (Lbs) 208 208 207 200 197 - -  BMI 26.69 26.69 26.57 25.67 25.28 - -        Gout involving toe Acute flare with right foot pain, prednisone prescribed and anti inflammatory, needs uric acid level checked also  Overweight (BMI 25.0-29.9) Deteriorated. Patient re-educated about  the importance of commitment to a  minimum of 150 minutes of exercise per week.  The importance of healthy food choices with portion control discussed. Encouraged to start a food diary, count calories and to consider  joining a support group. Sample diet sheets offered. Goals set by the patient for the next several months.   Weight /BMI 03/29/2015 03/04/2015 02/10/2015  WEIGHT 208 lb 208 lb 207 lb  HEIGHT 6\' 2"  6\' 2"  6\' 2"   BMI 26.69 kg/m2 26.69 kg/m2 26.57 kg/m2    Current exercise per week 60 minutes.   Hyperlipidemia LDL goal <100 Hyperlipidemia:Low fat diet discussed and encouraged.   Lipid Panel  Lab Results  Component Value Date   CHOL 268* 01/20/2015   HDL 52 01/20/2015   LDLCALC 158* 01/20/2015   LDLDIRECT 63 04/29/2012  TRIG 290* 01/20/2015   CHOLHDL 5.2* 01/20/2015  uncontrolled Updated lab needed at/ before next visit.

## 2015-02-10 NOTE — Patient Instructions (Addendum)
F/u in Decemebr, as before , call if you need me before  You are treated for uncontrolled allergies and right great toe pain. No infection, no antibiotic at this time  Uric acid level and chem 7 1 week before December  Visit  Please work on good  health habits so that your health will improve. 1. Commitment to daily physical activity for 30 to 60  minutes, if you are able to do this.  2. Commitment to wise food choices. Aim for half of your  food intake to be vegetable and fruit, one quarter starchy foods, and one quarter protein. Try to eat on a regular schedule  3 meals per day, snacking between meals should be limited to vegetables or fruits or small portions of nuts. 64 ounces of water per day is generally recommended, unless you have specific health conditions, like heart failure or kidney failure where you will need to limit fluid intake.  3. Commitment to sufficient and a  good quality of physical and mental rest daily, generally between 6 to 8 hours per day.  WITH PERSISTANCE AND PERSEVERANCE, THE IMPOSSIBLE , BECOMES THE NORM!  Thanks for choosing [facility], we consider it a privelige to serve you.

## 2015-02-10 NOTE — Telephone Encounter (Signed)
Medication changed at office visit.

## 2015-03-04 ENCOUNTER — Ambulatory Visit (INDEPENDENT_AMBULATORY_CARE_PROVIDER_SITE_OTHER): Payer: BLUE CROSS/BLUE SHIELD | Admitting: Family Medicine

## 2015-03-04 ENCOUNTER — Encounter: Payer: Self-pay | Admitting: Family Medicine

## 2015-03-04 VITALS — BP 120/80 | HR 76 | Resp 18 | Ht 74.0 in | Wt 208.0 lb

## 2015-03-04 DIAGNOSIS — K2100 Gastro-esophageal reflux disease with esophagitis, without bleeding: Secondary | ICD-10-CM

## 2015-03-04 DIAGNOSIS — I10 Essential (primary) hypertension: Secondary | ICD-10-CM

## 2015-03-04 DIAGNOSIS — E785 Hyperlipidemia, unspecified: Secondary | ICD-10-CM

## 2015-03-04 DIAGNOSIS — E790 Hyperuricemia without signs of inflammatory arthritis and tophaceous disease: Secondary | ICD-10-CM | POA: Diagnosis not present

## 2015-03-04 DIAGNOSIS — J3089 Other allergic rhinitis: Secondary | ICD-10-CM

## 2015-03-04 DIAGNOSIS — R7302 Impaired glucose tolerance (oral): Secondary | ICD-10-CM

## 2015-03-04 DIAGNOSIS — E663 Overweight: Secondary | ICD-10-CM

## 2015-03-04 NOTE — Assessment & Plan Note (Signed)
Patient educated about the importance of limiting  Carbohydrate intake , the need to commit to daily physical activity for a minimum of 30 minutes , and to commit weight loss. The fact that changes in all these areas will reduce or eliminate all together the development of diabetes is stressed.   Diabetic Labs Latest Ref Rng 01/20/2015 05/29/2014 01/15/2014 11/04/2013 04/29/2012  HbA1c <5.7 % 5.1 5.5 5.8(H) - -  Chol 125 - 200 mg/dL 268(H) 158 212(H) - 284(H)  HDL >=40 mg/dL 52 56 73 - 39(L)  Calc LDL <130 mg/dL 158(H) 71 111(H) - -  Triglycerides <150 mg/dL 290(H) 154(H) 138 - 631(H)  Creatinine 0.70 - 1.33 mg/dL 1.29 1.19 1.23 1.39(H) 1.26   BP/Weight 03/04/2015 02/10/2015 10/29/2014 10/22/2014 09/20/2014 07/21/2014 [id number]  Systolic BP 123456 [id number] 0000000 0000000 [id number]  Diastolic BP 80 80 76 78 92 86 82  Wt. (Lbs) 208 207 200 197 - - 208.2  BMI 26.69 26.57 25.67 25.28 - - 26.72   No flowsheet data found.

## 2015-03-04 NOTE — Assessment & Plan Note (Signed)
Deteriorated. Patient re-educated about  the importance of commitment to a  minimum of 150 minutes of exercise per week.  The importance of healthy food choices with portion control discussed. Encouraged to start a food diary, count calories and to consider  joining a support group. Sample diet sheets offered. Goals set by the patient for the next several months.   Weight /BMI 03/04/2015 02/10/2015 10/29/2014  WEIGHT 208 lb 207 lb 200 lb  HEIGHT 6\' 2"  6\' 2"  6\' 2"   BMI 26.69 kg/m2 26.57 kg/m2 25.67 kg/m2    Current exercise per week 90 minutes.

## 2015-03-04 NOTE — Assessment & Plan Note (Signed)
Controlled, no change in medication  

## 2015-03-04 NOTE — Assessment & Plan Note (Signed)
Updated lab needed at/ before next visit. Hyperlipidemia:Low fat diet discussed and encouraged.   Lipid Panel  Lab Results  Component Value Date   CHOL 268* 01/20/2015   HDL 52 01/20/2015   LDLCALC 158* 01/20/2015   LDLDIRECT 63 04/29/2012   TRIG 290* 01/20/2015   CHOLHDL 5.2* 01/20/2015

## 2015-03-04 NOTE — Assessment & Plan Note (Signed)
Controlled, no change in medication DASH diet and commitment to daily physical activity for a minimum of 30 minutes discussed and encouraged, as a part of hypertension management. The importance of attaining a healthy weight is also discussed.  BP/Weight 03/04/2015 02/10/2015 10/29/2014 10/22/2014 09/20/2014 07/21/2014 [id number]  Systolic BP 123456 [id number] 0000000 0000000 [id number]  Diastolic BP 80 80 76 78 92 86 82  Wt. (Lbs) 208 207 200 197 - - 208.2  BMI 26.69 26.57 25.67 25.28 - - 26.72

## 2015-03-04 NOTE — Patient Instructions (Addendum)
Physical exam March 19 or after, call if you need me before  Weight loss goal of 5 pounds  Use sudafed one every other day as needed for excess nasal drainage  Blood pressure  Is great, no med change  Fasting lipid, cmp and EGFr, Uric acid end February   Please work on good  health habits so that your health will improve. 1. Commitment to daily physical activity for 30 to 60  minutes, if you are able to do this.  2. Commitment to wise food choices. Aim for half of your  food intake to be vegetable and fruit, one quarter starchy foods, and one quarter protein. Try to eat on a regular schedule  3 meals per day, snacking between meals should be limited to vegetables or fruits or small portions of nuts. 64 ounces of water per day is generally recommended, unless you have specific health conditions, like heart failure or kidney failure where you will need to limit fluid intake.  3. Commitment to sufficient and a  good quality of physical and mental rest daily, generally between 6 to 8 hours per day.  WITH PERSISTANCE AND PERSEVERANCE, THE IMPOSSIBLE , BECOMES THE NORM!

## 2015-03-04 NOTE — Progress Notes (Signed)
Subjective:    Patient ID: Tristan Cook, male    DOB: [birth year], 54 y.o.   MRN: [id number]  HPI   Tristan Cook     MRN: [id number]      DOB: 07-09-1960   HPI Mr. Urtado is here for follow up and re-evaluation of chronic medical conditions, medication management and review of any available recent lab and radiology data.  Preventive health is updated, specifically  Cancer screening and Immunization.   Questions or concerns regarding consultations or procedures which the PT has had in the interim are  addressed. The PT denies any adverse reactions to current medications since the last visit.  There are no new concerns.  C/o increased nasal drainage and runny nose with sneezing in past 2 weeks despite daily loratidine Denies recent gout flare. Continued weight gain, intends to increase activity and reduce portion size to improve this  ROS Denies recent fever or chills. Denies sinus pressure, nasal congestion, ear pain or sore throat. Denies chest congestion, productive cough or wheezing. Denies chest pains, palpitations and leg swelling Denies abdominal pain, nausea, vomiting,diarrhea or constipation.   Denies dysuria, frequency, hesitancy or incontinence. Denies joint pain, swelling and limitation in mobility. Denies headaches, seizures, numbness, or tingling. Denies depression, anxiety or insomnia. Denies skin break down or rash.   PE  BP 120/80 mmHg  Pulse 76  Resp 18  Ht 6\' 2"  (1.88 m)  Wt 208 lb (94.348 kg)  BMI 26.69 kg/m2  SpO2 99%  Patient alert and oriented and in no cardiopulmonary distress.  HEENT: No facial asymmetry, EOMI,   oropharynx pink and moist.  Neck supple no JVD, no mass.  Chest: Clear to auscultation bilaterally.  CVS: S1, S2 no murmurs, no S3.Regular rate.  ABD: Soft non tender.   Ext: No edema  MS: Adequate ROM spine, shoulders, hips and knees.  Skin: Intact, no ulcerations or rash noted.  Psych: Good eye contact, normal affect.  Memory intact not anxious or depressed appearing.  CNS: CN 2-12 intact, power,  normal throughout.no focal deficits noted.   Assessment & Plan   Essential hypertension Controlled, no change in medication DASH diet and commitment to daily physical activity for a minimum of 30 minutes discussed and encouraged, as a part of hypertension management. The importance of attaining a healthy weight is also discussed.  BP/Weight 03/04/2015 02/10/2015 10/29/2014 10/22/2014 09/20/2014 07/21/2014 [id number]  Systolic BP 123456 [id number] 0000000 0000000 [id number]  Diastolic BP 80 80 76 78 92 86 82  Wt. (Lbs) 208 207 200 197 - - 208.2  BMI 26.69 26.57 25.67 25.28 - - 26.72        Allergic rhinitis Increased and uncontrolled , advised to take sudafed  Approximately 3 days per week  GERD (gastroesophageal reflux disease) Controlled, no change in medication   Hyperlipidemia LDL goal <100 Updated lab needed at/ before next visit. Hyperlipidemia:Low fat diet discussed and encouraged.   Lipid Panel  Lab Results  Component Value Date   CHOL 268* 01/20/2015   HDL 52 01/20/2015   LDLCALC 158* 01/20/2015   LDLDIRECT 63 04/29/2012   TRIG 290* 01/20/2015   CHOLHDL 5.2* 01/20/2015         Overweight (BMI 25.0-29.9) Deteriorated. Patient re-educated about  the importance of commitment to a  minimum of 150 minutes of exercise per week.  The importance of healthy food choices with portion control discussed. Encouraged to start a food diary, count calories and to consider  joining a  support group. Sample diet sheets offered. Goals set by the patient for the next several months.   Weight /BMI 03/04/2015 02/10/2015 10/29/2014  WEIGHT 208 lb 207 lb 200 lb  HEIGHT 6\' 2"  6\' 2"  6\' 2"   BMI 26.69 kg/m2 26.57 kg/m2 25.67 kg/m2    Current exercise per week 90 minutes.   IGT (impaired glucose tolerance) Patient educated about the importance of limiting  Carbohydrate intake , the need to commit to daily  physical activity for a minimum of 30 minutes , and to commit weight loss. The fact that changes in all these areas will reduce or eliminate all together the development of diabetes is stressed.   Diabetic Labs Latest Ref Rng 01/20/2015 05/29/2014 01/15/2014 11/04/2013 04/29/2012  HbA1c <5.7 % 5.1 5.5 5.8(H) - -  Chol 125 - 200 mg/dL 268(H) 158 212(H) - 284(H)  HDL >=40 mg/dL 52 56 73 - 39(L)  Calc LDL <130 mg/dL 158(H) 71 111(H) - -  Triglycerides <150 mg/dL 290(H) 154(H) 138 - 631(H)  Creatinine 0.70 - 1.33 mg/dL 1.29 1.19 1.23 1.39(H) 1.26   BP/Weight 03/04/2015 02/10/2015 10/29/2014 10/22/2014 09/20/2014 07/21/2014 [id number]  Systolic BP 123456 [id number] 0000000 0000000 [id number]  Diastolic BP 80 80 76 78 92 86 82  Wt. (Lbs) 208 207 200 197 - - 208.2  BMI 26.69 26.57 25.67 25.28 - - 26.72   No flowsheet data found.          Review of Systems     Objective:   Physical Exam        Assessment & Plan:

## 2015-03-04 NOTE — Assessment & Plan Note (Signed)
Increased and uncontrolled , advised to take sudafed  Approximately 3 days per week

## 2015-03-29 ENCOUNTER — Ambulatory Visit (HOSPITAL_COMMUNITY)
Admission: RE | Admit: 2015-03-29 | Discharge: 2015-03-29 | Disposition: A | Discharge status: Home / Self Care | Payer: BLUE CROSS/BLUE SHIELD | Source: Ambulatory Visit | Attending: Family Medicine | Admitting: Family Medicine

## 2015-03-29 ENCOUNTER — Telehealth: Payer: Self-pay

## 2015-03-29 ENCOUNTER — Encounter: Payer: Self-pay | Admitting: Gastroenterology

## 2015-03-29 ENCOUNTER — Ambulatory Visit (INDEPENDENT_AMBULATORY_CARE_PROVIDER_SITE_OTHER): Payer: BLUE CROSS/BLUE SHIELD | Admitting: Family Medicine

## 2015-03-29 ENCOUNTER — Encounter: Payer: Self-pay | Admitting: Family Medicine

## 2015-03-29 ENCOUNTER — Other Ambulatory Visit: Payer: Self-pay

## 2015-03-29 VITALS — BP 150/80 | HR 94 | Resp 18 | Ht 74.0 in | Wt 208.0 lb

## 2015-03-29 DIAGNOSIS — E291 Testicular hypofunction: Secondary | ICD-10-CM

## 2015-03-29 DIAGNOSIS — R5383 Other fatigue: Secondary | ICD-10-CM | POA: Diagnosis present

## 2015-03-29 DIAGNOSIS — K219 Gastro-esophageal reflux disease without esophagitis: Secondary | ICD-10-CM

## 2015-03-29 DIAGNOSIS — I10 Essential (primary) hypertension: Secondary | ICD-10-CM | POA: Diagnosis not present

## 2015-03-29 DIAGNOSIS — R059 Cough, unspecified: Secondary | ICD-10-CM

## 2015-03-29 DIAGNOSIS — R05 Cough: Secondary | ICD-10-CM

## 2015-03-29 DIAGNOSIS — R252 Cramp and spasm: Secondary | ICD-10-CM

## 2015-03-29 DIAGNOSIS — E559 Vitamin D deficiency, unspecified: Secondary | ICD-10-CM | POA: Diagnosis not present

## 2015-03-29 DIAGNOSIS — R7989 Other specified abnormal findings of blood chemistry: Secondary | ICD-10-CM

## 2015-03-29 DIAGNOSIS — M1000 Idiopathic gout, unspecified site: Secondary | ICD-10-CM

## 2015-03-29 DIAGNOSIS — R0789 Other chest pain: Secondary | ICD-10-CM

## 2015-03-29 DIAGNOSIS — R7302 Impaired glucose tolerance (oral): Secondary | ICD-10-CM

## 2015-03-29 LAB — CBC
HCT: 42.9 % (ref 39.0–52.0)
HEMOGLOBIN: 14.5 g/dL (ref 13.0–17.0)
MCH: 30.3 pg (ref 26.0–34.0)
MCHC: 33.8 g/dL (ref 30.0–36.0)
MCV: 89.7 fL (ref 78.0–100.0)
MPV: 9.1 fL (ref 8.6–12.4)
Platelets: 386 10*3/uL (ref 150–400)
RBC: 4.78 MIL/uL (ref 4.22–5.81)
RDW: 15.1 % (ref 11.5–15.5)
WBC: 5.3 10*3/uL (ref 4.0–10.5)

## 2015-03-29 LAB — POCT URINALYSIS DIPSTICK
BILIRUBIN UA: NEGATIVE
GLUCOSE UA: NEGATIVE
Ketones, UA: NEGATIVE
LEUKOCYTES UA: NEGATIVE
NITRITE UA: NEGATIVE
Protein, UA: NEGATIVE
Spec Grav, UA: 1.02
Urobilinogen, UA: 0.2
pH, UA: 6

## 2015-03-29 LAB — COMPREHENSIVE METABOLIC PANEL
ALBUMIN: 4.2 g/dL (ref 3.6–5.1)
ALT: 15 U/L (ref 9–46)
AST: 26 U/L (ref 10–35)
Alkaline Phosphatase: 68 U/L (ref 40–115)
BUN: 11 mg/dL (ref 7–25)
CALCIUM: 9.2 mg/dL (ref 8.6–10.3)
CHLORIDE: 100 mmol/L (ref 98–110)
CO2: 27 mmol/L (ref 20–31)
CREATININE: 1.32 mg/dL (ref 0.70–1.33)
Glucose, Bld: 91 mg/dL (ref 65–99)
Potassium: 4.3 mmol/L (ref 3.5–5.3)
SODIUM: 137 mmol/L (ref 135–146)
Total Bilirubin: 0.7 mg/dL (ref 0.2–1.2)
Total Protein: 7.2 g/dL (ref 6.1–8.1)

## 2015-03-29 LAB — MAGNESIUM: Magnesium: 1.9 mg/dL (ref 1.5–2.5)

## 2015-03-29 LAB — VITAMIN B12: Vitamin B-12: 269 pg/mL (ref 211–911)

## 2015-03-29 LAB — TSH: TSH: 1.006 u[IU]/mL (ref 0.350–4.500)

## 2015-03-29 LAB — CALCIUM: Calcium: 9.2 mg/dL (ref 8.6–10.3)

## 2015-03-29 LAB — CK: Total CK: 207 U/L (ref 7–232)

## 2015-03-29 NOTE — Telephone Encounter (Signed)
Patient is coming in today at 1:00

## 2015-03-29 NOTE — Patient Instructions (Addendum)
F/u in 6 weeks, call if you need me sooner  As discussed, CXR, EKG and UA today, also labs to be drawn  If No cause for symptoms found, I will refer you to specialist who will be able to help, starting with a sleep study as you do snore  Depression screen is negative   Wor excuse to return on 03/31/2015  Thanks for choosing [facility], we consider it a privelige to serve you.  Hope you feel better soon   INCREASE prinivil to ONE daily, BP is high

## 2015-03-29 NOTE — Assessment & Plan Note (Signed)
Uncontrolled, increase prinivil dose to ONE daily, currently taking half daily DASH diet and commitment to daily physical activity for a minimum of 30 minutes discussed and encouraged, as a part of hypertension management. The importance of attaining a healthy weight is also discussed.  BP/Weight 03/29/2015 03/04/2015 02/10/2015 10/29/2014 10/22/2014 09/20/2014 Q000111Q  Systolic BP Q000111Q [id number] 0000000 0000000  Diastolic BP 80 80 80 76 78 92 86  Wt. (Lbs) 208 208 207 200 197 - -  BMI 26.69 26.69 26.57 25.67 25.28 - -

## 2015-03-29 NOTE — Telephone Encounter (Signed)
pls give work in appt today , before 2:30

## 2015-03-29 NOTE — Progress Notes (Signed)
   Subjective:    Patient ID: Tristan Cook, male    DOB: [birth year], 55 y.o.   MRN: [id number]  HPI Pt in with main c/o excessive fatigue over past 3 days , severe enough to take him out of work today. C/o absolute lack of energy and drive and  poor exercise tolerance No recent h/o fever, unsure of chills, has cough sometimes has sputum which is thick   Review of Systems See HPI Denies sinus pressure, nasal congestion, ear pain or sore throat.Using allergy medication faithfully Denies chest congestion, productive cough or wheezing. Denies , palpitations and leg swelling, intermittent chest pain, no PND or orthopnea Denies abdominal pain, nausea, vomiting,diarrhea or constipation.   Denies dysuria, frequency, hesitancy or incontinence. Denies joint pain, swelling and limitation in mobility. Denies headaches, seizures, numbness, or tingling. Denies depression, anxiety or insomnia. Denies skin break down or rash.        Objective:   Physical Exam BP 150/80 mmHg  Pulse 94  Resp 18  Ht 6\' 2"  (1.88 m)  Wt 208 lb (94.348 kg)  BMI 26.69 kg/m2  SpO2 100% Patient alert and oriented and in no cardiopulmonary distress.  HEENT: No facial asymmetry, EOMI,   oropharynx pink and moist.  Neck supple no JVD, no mass.  Chest: Clear to auscultation bilaterally.No reproducible chest wall tenderness  CVS: S1, S2 no murmurs, no S3.Regular rate. EKG: NSR , no ischemia ABD: Soft non tender.   Ext: No edema  MS: Adequate ROM spine, shoulders, hips and knees.  Skin: Intact, no ulcerations or rash noted.  Psych: Good eye contact, normal affect. Memory intact not anxious or depressed appearing.  CNS: CN 2-12 intact, power,  normal throughout.no focal deficits noted.        Assessment & Plan:  Essential hypertension Uncontrolled, increase prinivil dose to ONE daily, currently taking half daily DASH diet and commitment to daily physical activity for a minimum of 30 minutes discussed  and encouraged, as a part of hypertension management. The importance of attaining a healthy weight is also discussed.  BP/Weight 03/29/2015 03/04/2015 02/10/2015 10/29/2014 10/22/2014 09/20/2014 Q000111Q  Systolic BP Q000111Q [id number]  Diastolic BP 80 80 80 76 78 92 86  Wt. (Lbs) 208 208 207 200 197 - -  BMI 26.69 26.69 26.57 25.67 25.28 - -        Fatigue 5 day h/o excessive fatigue requiring pt to leave work. No associated viral symptoms, no fever or chills EKG: NSR no ischemic changes  IGT (impaired glucose tolerance)    Low serum testosterone Low testosterone level in patient with chronic fatigue , will refer to urology for further evaluation and management as deemed necessary  Gout No recent flare, uric acid level to be checked  GERD (gastroesophageal reflux disease) Controlled, no change in medication   Cough Cough with increased fatigue, CXR to furhter evaluate  Atypical chest pain Increased fatigue, EKG to further assess

## 2015-03-30 LAB — URINALYSIS, ROUTINE W REFLEX MICROSCOPIC
Bilirubin Urine: NEGATIVE
GLUCOSE, UA: NEGATIVE
Hgb urine dipstick: NEGATIVE
Ketones, ur: NEGATIVE
LEUKOCYTES UA: NEGATIVE
NITRITE: NEGATIVE
PH: 6 (ref 5.0–8.0)
Protein, ur: NEGATIVE
SPECIFIC GRAVITY, URINE: 1.02 (ref 1.001–1.035)

## 2015-03-30 LAB — VITAMIN D 25 HYDROXY (VIT D DEFICIENCY, FRACTURES): VIT D 25 HYDROXY: 8 ng/mL — AB (ref 30–100)

## 2015-03-30 LAB — TESTOSTERONE: Testosterone: 187 ng/dL — ABNORMAL LOW (ref 250–827)

## 2015-03-30 MED ORDER — VITAMIN D (ERGOCALCIFEROL) 1.25 MG (50000 UNIT) PO CAPS: 50000.0000 [IU] | ORAL_CAPSULE | ORAL | Status: DC

## 2015-04-01 NOTE — Assessment & Plan Note (Signed)
5 day h/o excessive fatigue requiring pt to leave work. No associated viral symptoms, no fever or chills EKG: NSR no ischemic changes

## 2015-04-13 ENCOUNTER — Encounter: Payer: Self-pay | Admitting: Family Medicine

## 2015-04-16 DIAGNOSIS — R059 Cough, unspecified: Secondary | ICD-10-CM | POA: Insufficient documentation

## 2015-04-16 DIAGNOSIS — R7989 Other specified abnormal findings of blood chemistry: Secondary | ICD-10-CM | POA: Insufficient documentation

## 2015-04-16 DIAGNOSIS — R05 Cough: Secondary | ICD-10-CM | POA: Insufficient documentation

## 2015-04-16 DIAGNOSIS — R0789 Other chest pain: Secondary | ICD-10-CM | POA: Insufficient documentation

## 2015-04-16 NOTE — Assessment & Plan Note (Signed)
No recent flare, uric acid level to be checked

## 2015-04-16 NOTE — Assessment & Plan Note (Signed)
Increased fatigue, EKG to further assess

## 2015-04-16 NOTE — Assessment & Plan Note (Signed)
Controlled, no change in medication  

## 2015-04-16 NOTE — Assessment & Plan Note (Signed)
Low testosterone level in patient with chronic fatigue , will refer to urology for further evaluation and management as deemed necessary

## 2015-04-16 NOTE — Assessment & Plan Note (Signed)
Cough with increased fatigue, CXR to furhter evaluate

## 2015-04-25 DIAGNOSIS — E785 Hyperlipidemia, unspecified: Secondary | ICD-10-CM | POA: Insufficient documentation

## 2015-04-25 NOTE — Assessment & Plan Note (Signed)
Controlled, no change in medication DASH diet and commitment to daily physical activity for a minimum of 30 minutes discussed and encouraged, as a part of hypertension management. The importance of attaining a healthy weight is also discussed.  BP/Weight 03/29/2015 03/04/2015 02/10/2015 10/29/2014 10/22/2014 09/20/2014 Q000111Q  Systolic BP Q000111Q [id number] 0000000 0000000  Diastolic BP 80 80 80 76 78 92 86  Wt. (Lbs) 208 208 207 200 197 - -  BMI 26.69 26.69 26.57 25.67 25.28 - -

## 2015-04-25 NOTE — Assessment & Plan Note (Signed)
Hyperlipidemia:Low fat diet discussed and encouraged.   Lipid Panel  Lab Results  Component Value Date   CHOL 268* 01/20/2015   HDL 52 01/20/2015   LDLCALC 158* 01/20/2015   LDLDIRECT 63 04/29/2012   TRIG 290* 01/20/2015   CHOLHDL 5.2* 01/20/2015  uncontrolled Updated lab needed at/ before next visit.

## 2015-04-25 NOTE — Assessment & Plan Note (Signed)
Acute flare with right foot pain, prednisone prescribed and anti inflammatory, needs uric acid level checked also

## 2015-04-25 NOTE — Assessment & Plan Note (Signed)
Deteriorated. Patient re-educated about  the importance of commitment to a  minimum of 150 minutes of exercise per week.  The importance of healthy food choices with portion control discussed. Encouraged to start a food diary, count calories and to consider  joining a support group. Sample diet sheets offered. Goals set by the patient for the next several months.   Weight /BMI 03/29/2015 03/04/2015 02/10/2015  WEIGHT 208 lb 208 lb 207 lb  HEIGHT 6\' 2"  6\' 2"  6\' 2"   BMI 26.69 kg/m2 26.69 kg/m2 26.57 kg/m2    Current exercise per week 60 minutes.

## 2015-04-25 NOTE — Assessment & Plan Note (Signed)
Uncontrolled, prednisone dose pack and commitment to daily medication for symptom control, also encouraged to do saline flushes twice daily

## 2015-05-05 ENCOUNTER — Ambulatory Visit: Payer: BLUE CROSS/BLUE SHIELD | Admitting: Urology

## 2015-05-05 ENCOUNTER — Encounter (INDEPENDENT_AMBULATORY_CARE_PROVIDER_SITE_OTHER): Payer: Self-pay

## 2015-05-05 ENCOUNTER — Ambulatory Visit (INDEPENDENT_AMBULATORY_CARE_PROVIDER_SITE_OTHER): Payer: BLUE CROSS/BLUE SHIELD | Admitting: Gastroenterology

## 2015-05-05 ENCOUNTER — Encounter: Payer: Self-pay | Admitting: Gastroenterology

## 2015-05-05 VITALS — BP 139/78 | HR 81 | Temp 97.6°F | Ht 74.0 in | Wt 201.4 lb

## 2015-05-05 DIAGNOSIS — K219 Gastro-esophageal reflux disease without esophagitis: Secondary | ICD-10-CM | POA: Diagnosis not present

## 2015-05-05 DIAGNOSIS — K222 Esophageal obstruction: Secondary | ICD-10-CM

## 2015-05-05 MED ORDER — OMEPRAZOLE 20 MG PO CPDR: 20.0000 mg | DELAYED_RELEASE_CAPSULE | Freq: Two times a day (BID) | ORAL | Status: DC

## 2015-05-05 NOTE — Progress Notes (Signed)
Primary Care Physician: Tula Nakayama, MD  Primary Gastroenterologist:  Barney Drain, MD   Chief Complaint  Patient presents with  . Follow-up    HPI: Tristan Cook is a 55 y.o. male here for six-month follow-up. He has a history of GERD, peptic stricture with dysphagia. Currently on omeprazole 20 mg twice a day. Heartburn is well-controlled. Denies abdominal pain, vomiting. Appetite is good. Denies any dysphagia for the most part but had one episode of sensation of food sticking with the same which in the past 6 months. Bowel movements regular. No blood in the stool or melena. He is due for surveillance colonoscopy for history of colon polyps in December of this year.  Recent labs revealed testosterone and vitamin D deficiency. Being managed by Dr. Moshe Cipro. Was supposed to see the urologist today but the urology office canceled his office visit because the provider was ill.  Wt Readings from Last 3 Encounters:  05/05/15 201 lb 6.4 oz (91.354 kg)  03/29/15 208 lb (94.348 kg)  03/04/15 208 lb (94.348 kg)     Current Outpatient Prescriptions  Medication Sig Dispense Refill  . allopurinol (ZYLOPRIM) 300 MG tablet Take 1 tablet (300 mg total) by mouth daily. 30 tablet 6  . aspirin 81 MG tablet Take 1 tablet (81 mg total) by mouth daily. 30 tablet   . lisinopril (PRINIVIL,ZESTRIL) 20 MG tablet Take 1 tablet (20 mg total) by mouth daily. 30 tablet 6  . loratadine (CLARITIN) 10 MG tablet Take 1 tablet (10 mg total) by mouth daily. 30 tablet 5  . lovastatin (MEVACOR) 40 MG tablet Take 1 tablet (40 mg total) by mouth at bedtime. 30 tablet 3  . omeprazole (PRILOSEC) 20 MG capsule Take 1 capsule (20 mg total) by mouth 2 (two) times daily before a meal. 60 capsule 6  . Vitamin D, Ergocalciferol, (DRISDOL) 50000 units CAPS capsule Take 1 capsule (50,000 Units total) by mouth every 7 (seven) days. 12 capsule 1  . [DISCONTINUED] lisinopril-hydrochlorothiazide (PRINZIDE,ZESTORETIC)  10-12.5 MG per tablet One tablet once daily Dose increase effective 04/23/2012 30 tablet 5   No current facility-administered medications for this visit.    Allergies as of 05/05/2015  . (No Known Allergies)    ROS:  General: Negative for anorexia, weight loss, fever, chills, fatigue, weakness. ENT: Negative for hoarseness, difficulty swallowing , nasal congestion. CV: Negative for chest pain, angina, palpitations, dyspnea on exertion, peripheral edema.  Respiratory: Negative for dyspnea at rest, dyspnea on exertion, cough, sputum, wheezing.  GI: See history of present illness. GU:  Negative for dysuria, hematuria, urinary incontinence, urinary frequency, nocturnal urination.  Endo: Negative for unusual weight change.    Physical Examination:   BP 139/78 mmHg  Pulse 81  Temp(Src) 97.6 F (36.4 C) (Oral)  Ht 6\' 2"  (1.88 m)  Wt 201 lb 6.4 oz (91.354 kg)  BMI 25.85 kg/m2  General: Well-nourished, well-developed in no acute distress.  Eyes: No icterus. Mouth: Oropharyngeal mucosa moist and pink , no lesions erythema or exudate. Lungs: Clear to auscultation bilaterally.  Heart: Regular rate and rhythm, no murmurs rubs or gallops.  Abdomen: Bowel sounds are normal, nontender, nondistended, no hepatosplenomegaly or masses, no abdominal bruits or hernia , no rebound or guarding.   Extremities: No lower extremity edema. No clubbing or deformities. Neuro: Alert and oriented x 4   Skin: Warm and dry, no jaundice.   Psych: Alert and cooperative, normal mood and affect.  Labs:  Lab Results  Component  Value Date   WBC 5.3 03/29/2015   HGB 14.5 03/29/2015   HCT 42.9 03/29/2015   MCV 89.7 03/29/2015   PLT 386 03/29/2015   Lab Results  Component Value Date   TSH 1.006 03/29/2015   Lab Results  Component Value Date   CREATININE 1.32 03/29/2015   BUN 11 03/29/2015   NA 137 03/29/2015   K 4.3 03/29/2015   CL 100 03/29/2015   CO2 27 03/29/2015   Lab Results  Component Value  Date   ALT 15 03/29/2015   AST 26 03/29/2015   ALKPHOS 68 03/29/2015   BILITOT 0.7 03/29/2015   Lab Results  Component Value Date   VITAMINB12 269 03/29/2015     Imaging Studies: No results found.

## 2015-05-05 NOTE — Patient Instructions (Signed)
1. Please go and pick up her prescription for vitamin D as per Dr. Griffin Dakin previous recommendations. 2. According to Dr. Griffin Dakin note, she was going to leave testosterone replacement therapy up to the urologist that you were supposed to see today. 3. Continue omeprazole 20 mg twice daily for now for acid reflux. You may consider dropping back to once daily dosing as long as she did not have recurrent heartburn, chest discomfort, swallowing issues. 4. Return to the office in one year or sooner if needed. 5. You will be due for your next colonoscopy in December of this year for history of colon polyps.

## 2015-05-05 NOTE — Assessment & Plan Note (Signed)
Clinically doing well. He may continue omeprazole twice daily but any time he can try once daily dosing to see if his symptoms are adequately controlled. He will return to the office in one year or sooner if needed. As of note, he has a history of adenomatous colon polyps and will be due for his surveillance colonoscopy in December of this year.

## 2015-05-05 NOTE — Progress Notes (Signed)
cc'ed to pcp °

## 2015-06-03 ENCOUNTER — Ambulatory Visit (INDEPENDENT_AMBULATORY_CARE_PROVIDER_SITE_OTHER): Payer: BLUE CROSS/BLUE SHIELD | Admitting: Family Medicine

## 2015-06-03 ENCOUNTER — Encounter: Payer: Self-pay | Admitting: Family Medicine

## 2015-06-03 VITALS — BP 140/84 | HR 79 | Resp 16 | Ht 74.0 in | Wt 209.0 lb

## 2015-06-03 DIAGNOSIS — E790 Hyperuricemia without signs of inflammatory arthritis and tophaceous disease: Secondary | ICD-10-CM

## 2015-06-03 DIAGNOSIS — E785 Hyperlipidemia, unspecified: Secondary | ICD-10-CM | POA: Diagnosis not present

## 2015-06-03 DIAGNOSIS — E559 Vitamin D deficiency, unspecified: Secondary | ICD-10-CM

## 2015-06-03 DIAGNOSIS — Z0000 Encounter for general adult medical examination without abnormal findings: Secondary | ICD-10-CM | POA: Insufficient documentation

## 2015-06-03 DIAGNOSIS — I10 Essential (primary) hypertension: Secondary | ICD-10-CM

## 2015-06-03 DIAGNOSIS — G4700 Insomnia, unspecified: Secondary | ICD-10-CM

## 2015-06-03 DIAGNOSIS — Z1211 Encounter for screening for malignant neoplasm of colon: Secondary | ICD-10-CM

## 2015-06-03 LAB — POC HEMOCCULT BLD/STL (OFFICE/1-CARD/DIAGNOSTIC): Fecal Occult Blood, POC: POSITIVE — AB

## 2015-06-03 MED ORDER — LISINOPRIL 40 MG PO TABS: 40.0000 mg | ORAL_TABLET | Freq: Every day | ORAL | Status: DC

## 2015-06-03 MED ORDER — TEMAZEPAM 30 MG PO CAPS: 30.0000 mg | ORAL_CAPSULE | Freq: Every evening | ORAL | Status: DC | PRN

## 2015-06-03 NOTE — Patient Instructions (Signed)
F/u in 2 month, call if you need me before  BP is high, NEW increased dose of lisinopril 40 mg one daily, (OK to take two 20 mg tabs daily till done)  Practice good sleep hygiene, new for sleep is restoril; 1 at bedtime  Fasting labs in 2 months, 65 days before appt  Insomnia Insomnia is a sleep disorder that makes it difficult to fall asleep or to stay asleep. Insomnia can cause tiredness (fatigue), low energy, difficulty concentrating, mood swings, and poor performance at work or school.  There are three different ways to classify insomnia:  Difficulty falling asleep.  Difficulty staying asleep.  Waking up too early in the morning. Any type of insomnia can be long-term (chronic) or short-term (acute). Both are common. Short-term insomnia usually lasts for three months or less. Chronic insomnia occurs at least three times a week for longer than three months. CAUSES  Insomnia may be caused by another condition, situation, or substance, such as:  Anxiety.  Certain medicines.  Gastroesophageal reflux disease (GERD) or other gastrointestinal conditions.  Asthma or other breathing conditions.  Restless legs syndrome, sleep apnea, or other sleep disorders.  Chronic pain.  Menopause. This may include hot flashes.  Stroke.  Abuse of alcohol, tobacco, or illegal drugs.  Depression.  Caffeine.   Neurological disorders, such as Alzheimer disease.  An overactive thyroid (hyperthyroidism). The cause of insomnia may not be known. RISK FACTORS Risk factors for insomnia include:  Gender. Women are more commonly affected than men.  Age. Insomnia is more common as you get older.  Stress. This may involve your professional or personal life.  Income. Insomnia is more common in people with lower income.  Lack of exercise.   Irregular work schedule or night shifts.  Traveling between different time zones. SIGNS AND SYMPTOMS If you have insomnia, trouble falling asleep or  trouble staying asleep is the main symptom. This may lead to other symptoms, such as:  Feeling fatigued.  Feeling nervous about going to sleep.  Not feeling rested in the morning.  Having trouble concentrating.  Feeling irritable, anxious, or depressed. TREATMENT  Treatment for insomnia depends on the cause. If your insomnia is caused by an underlying condition, treatment will focus on addressing the condition. Treatment may also include:   Medicines to help you sleep.  Counseling or therapy.  Lifestyle adjustments. HOME CARE INSTRUCTIONS   Take medicines only as directed by your health care provider.  Keep regular sleeping and waking hours. Avoid naps.  Keep a sleep diary to help you and your health care provider figure out what could be causing your insomnia. Include:   When you sleep.  When you wake up during the night.  How well you sleep.   How rested you feel the next day.  Any side effects of medicines you are taking.  What you eat and drink.   Make your bedroom a comfortable place where it is easy to fall asleep:  Put up shades or special blackout curtains to block light from outside.  Use a white noise machine to block noise.  Keep the temperature cool.   Exercise regularly as directed by your health care provider. Avoid exercising right before bedtime.  Use relaxation techniques to manage stress. Ask your health care provider to suggest some techniques that may work well for you. These may include:  Breathing exercises.  Routines to release muscle tension.  Visualizing peaceful scenes.  Cut back on alcohol, caffeinated beverages, and cigarettes, especially close  to bedtime. These can disrupt your sleep.  Do not overeat or eat spicy foods right before bedtime. This can lead to digestive discomfort that can make it hard for you to sleep.  Limit screen use before bedtime. This includes:  Watching TV.  Using your smartphone, tablet, and  computer.  Stick to a routine. This can help you fall asleep faster. Try to do a quiet activity, brush your teeth, and go to bed at the same time each night.  Get out of bed if you are still awake after 15 minutes of trying to sleep. Keep the lights down, but try reading or doing a quiet activity. When you feel sleepy, go back to bed.  Make sure that you drive carefully. Avoid driving if you feel very sleepy.  Keep all follow-up appointments as directed by your health care provider. This is important. SEEK MEDICAL CARE IF:   You are tired throughout the day or have trouble in your daily routine due to sleepiness.  You continue to have sleep problems or your sleep problems get worse. SEEK IMMEDIATE MEDICAL CARE IF:   You have serious thoughts about hurting yourself or someone else.   This information is not intended to replace advice given to you by your health care provider. Make sure you discuss any questions you have with your health care provider.   Document Released: 02/25/2000 Document Revised: 11/18/2014 Document Reviewed: 11/28/2013 Elsevier Interactive Patient Education [employer].

## 2015-06-03 NOTE — Assessment & Plan Note (Signed)

## 2015-06-03 NOTE — Progress Notes (Signed)
   Subjective:    Patient ID: Tristan Cook, male    DOB: September 27, 1960, 55 y.o.   MRN: [id number]  HPI Patient is in for annual physical exam. C/o chronic insomnia, gets about 4 to 5 hrs sleep and has chronic fatigue. No increased anxiety and not depressed. Recent labs, if available are reviewed. Immunization is reviewed , and  updated if needed.    Review of Systems See HPI      Objective:   Physical Exam  BP 140/84 mmHg  Pulse 79  Resp 16  Ht 6\' 2"  (1.88 m)  Wt 209 lb (94.802 kg)  BMI 26.82 kg/m2  SpO2 100% Pleasant well nourished male, alert and oriented x 3, in no cardio-pulmonary distress. Afebrile. HEENT No facial trauma or asymetry. Sinuses non tender. EOMI, PERTL, fundoscopic exam is negative for hemorhages or exudates. External ears normal, tympanic membranes clear. Oropharynx moist, no exudate, good dentition. Neck: supple, no adenopathy,JVD or thyromegaly.No bruits.  Chest: Clear to ascultation bilaterally.No crackles or wheezes. Non tender to palpation  Breast: No asymetry,no masses. No nipple discharge or inversion. No axillary or supraclavicular adenopathy  Cardiovascular system; Heart sounds normal,  S1 and  S2 ,no S3.  No murmur, or thrill. Apical beat not displaced Peripheral pulses normal.  Abdomen: Soft, non tender, no organomegaly or masses. No bruits. Bowel sounds normal. No guarding, tenderness or rebound.  Rectal:  Normal sphincter tone. Internal hemorrhoids . guaiac positive  stool. Prostate smooth and firm  GU: Not examined  Musculoskeletal exam: Full ROM of spine, hips , shoulders and knees. No deformity ,swelling or crepitus noted. No muscle wasting or atrophy.   Neurologic: Cranial nerves 2 to 12 intact. Power, tone ,sensation and reflexes normal throughout. No disturbance in gait. No tremor.  Skin: Intact, no ulceration, erythema , scaling or rash noted. Pigmentation normal throughout  Psych; Normal mood and  affect. Judgement and concentration normal        Assessment & Plan:  Annual physical exam Annual exam as documented. Counseling done  re healthy lifestyle involving commitment to 150 minutes exercise per week, heart healthy diet, and attaining healthy weight.The importance of adequate sleep also discussed. Regular seat belt use and home safety, is also discussed. Changes in health habits are decided on by the patient with goals and time frames  set for achieving them. Immunization and cancer screening needs are specifically addressed at this visit.   Insomnia Sleep hygiene reviewed and written information offered also. Prescription sent for  medication needed.   Essential hypertension Uncontrolled, increase dose of medication DASH diet and commitment to daily physical activity for a minimum of 30 minutes discussed and encouraged, as a part of hypertension management. The importance of attaining a healthy weight is also discussed.  BP/Weight 06/03/2015 05/05/2015 03/29/2015 03/04/2015 02/10/2015 10/29/2014 Q000111Q  Systolic BP [id number] [id number] Q000111Q 123456 [id number]  Diastolic BP 84 78 80 80 80 76 78  Wt. (Lbs) 209 201.4 208 208 207 200 197  BMI 26.82 25.85 26.69 26.69 26.57 25.67 25.28      F/u in 6 to 8 weeks

## 2015-06-04 NOTE — Assessment & Plan Note (Signed)
Sleep hygiene reviewed and written information offered also. Prescription sent for  medication needed.  

## 2015-06-04 NOTE — Assessment & Plan Note (Signed)
Uncontrolled, increase dose of medication DASH diet and commitment to daily physical activity for a minimum of 30 minutes discussed and encouraged, as a part of hypertension management. The importance of attaining a healthy weight is also discussed.  BP/Weight 06/03/2015 05/05/2015 03/29/2015 03/04/2015 02/10/2015 10/29/2014 Q000111Q  Systolic BP XX123456 [id number] Q000111Q 123456 [id number]  Diastolic BP 84 78 80 80 80 76 78  Wt. (Lbs) 209 201.4 208 208 207 200 197  BMI 26.82 25.85 26.69 26.69 26.57 25.67 25.28      F/u in 6 to 8 weeks

## 2015-06-15 ENCOUNTER — Telehealth: Payer: Self-pay

## 2015-06-15 ENCOUNTER — Ambulatory Visit (INDEPENDENT_AMBULATORY_CARE_PROVIDER_SITE_OTHER): Payer: BLUE CROSS/BLUE SHIELD | Admitting: Family Medicine

## 2015-06-15 ENCOUNTER — Encounter: Payer: Self-pay | Admitting: Family Medicine

## 2015-06-15 ENCOUNTER — Ambulatory Visit (HOSPITAL_COMMUNITY)
Admission: RE | Admit: 2015-06-15 | Discharge: 2015-06-15 | Disposition: A | Discharge status: Home / Self Care | Payer: BLUE CROSS/BLUE SHIELD | Source: Ambulatory Visit | Attending: Family Medicine | Admitting: Family Medicine

## 2015-06-15 ENCOUNTER — Telehealth: Payer: Self-pay | Admitting: Family Medicine

## 2015-06-15 VITALS — BP 150/90 | HR 76 | Resp 18 | Ht 74.0 in | Wt 202.0 lb

## 2015-06-15 DIAGNOSIS — M549 Dorsalgia, unspecified: Secondary | ICD-10-CM | POA: Diagnosis not present

## 2015-06-15 DIAGNOSIS — E785 Hyperlipidemia, unspecified: Secondary | ICD-10-CM

## 2015-06-15 DIAGNOSIS — M545 Low back pain: Secondary | ICD-10-CM | POA: Insufficient documentation

## 2015-06-15 DIAGNOSIS — I10 Essential (primary) hypertension: Secondary | ICD-10-CM

## 2015-06-15 MED ORDER — PREDNISONE 5 MG (21) PO TBPK: 5.0000 mg | ORAL_TABLET | ORAL | Status: DC

## 2015-06-15 MED ORDER — KETOROLAC TROMETHAMINE 60 MG/2ML IM SOLN
60.0000 mg | Freq: Once | INTRAMUSCULAR | Status: AC
  Administered 2015-06-15: 60 mg via INTRAMUSCULAR

## 2015-06-15 MED ORDER — AMLODIPINE BESYLATE 5 MG PO TABS: 5.0000 mg | ORAL_TABLET | Freq: Every day | ORAL | Status: DC

## 2015-06-15 MED ORDER — GABAPENTIN 100 MG PO CAPS: 100.0000 mg | ORAL_CAPSULE | Freq: Every day | ORAL | Status: DC

## 2015-06-15 MED ORDER — METHYLPREDNISOLONE ACETATE 80 MG/ML IJ SUSP
80.0000 mg | Freq: Once | INTRAMUSCULAR | Status: AC
  Administered 2015-06-15: 80 mg via INTRAMUSCULAR

## 2015-06-15 NOTE — Progress Notes (Signed)
   Subjective:    Patient ID: Tristan Cook, male    DOB: February 11, 1961, 55 y.o.   MRN: [id number]  HPI Lifted 280 lb Mom 2 weeks ago, since yestrday low back pain radiating to groin rated from 8 to 10. Never had this before, denies lower extremity weakness or numbness, denies incontinence of stool or urine    Review of Systems See HPI Denies recent fever or chills. Denies sinus pressure, nasal congestion, ear pain or sore throat. Denies chest congestion, productive cough or wheezing. Denies chest pains, palpitations and leg swelling Denies abdominal pain, nausea, vomiting,diarrhea or constipation.   Denies dysuria, frequency, hesitancy or incontinence.  Denies headaches, seizures, numbness, or tingling. Denies depression, anxiety or insomnia. Denies skin break down or rash.        Objective:   Physical Exam  BP 150/90 mmHg  Pulse 76  Resp 18  Ht 6\' 2"  (1.88 m)  Wt 202 lb (91.627 kg)  BMI 25.92 kg/m2  SpO2 100% Patient alert and oriented and in no cardiopulmonary distress.Pt in pain  HEENT: No facial asymmetry, EOMI,   oropharynx pink and moist.  Neck supple no JVD, no mass.  Chest: Clear to auscultation bilaterally.  CVS: S1, S2 no murmurs, no S3.Regular rate.  ABD: Soft non tender.   Ext: No edema  [id number]  ROM lumbar  Spine, adequate in  shoulders, hips and knees.  Skin: Intact, no ulcerations or rash noted.  Psych: Good eye contact, normal affect. Memory intact not anxious or depressed appearing.  CNS: CN 2-12 intact, power,  normal throughout.no focal deficits noted.       Assessment & Plan:  Essential hypertension Uncontrolled, add amlodipine DASH diet and commitment to daily physical activity for a minimum of 30 minutes discussed and encouraged, as a part of hypertension management. The importance of attaining a healthy weight is also discussed.  BP/Weight 06/15/2015 06/03/2015 05/05/2015 03/29/2015 03/04/2015 02/10/2015 [id number]  Systolic BP  Q000111Q [id number] [id number] Q000111Q 123456 [id number]  Diastolic BP 90 84 78 80 80 80 76  Wt. (Lbs) 202 209 201.4 208 208 207 200  BMI 25.92 26.82 25.85 26.69 26.69 26.57 25.67        Back pain with radiation Uncontrolled.Toradol and depo medrol administered IM in the office , to be followed by a short course of oral prednisone and NSAIDS. X ray and start nightly gabapentin also Work excuse   Hyperlipidemia LDL goal <100 Hyperlipidemia:Low fat diet discussed and encouraged.   Lipid Panel  Lab Results  Component Value Date   CHOL 268* 01/20/2015   HDL 52 01/20/2015   LDLCALC 158* 01/20/2015   LDLDIRECT 63 04/29/2012   TRIG 290* 01/20/2015   CHOLHDL 5.2* 01/20/2015  Uncontrolled Updated lab needed at/ before next visit.

## 2015-06-15 NOTE — Assessment & Plan Note (Addendum)
Uncontrolled, add amlodipine DASH diet and commitment to daily physical activity for a minimum of 30 minutes discussed and encouraged, as a part of hypertension management. The importance of attaining a healthy weight is also discussed.  BP/Weight 06/15/2015 06/03/2015 05/05/2015 03/29/2015 03/04/2015 02/10/2015 [id number]  Systolic BP Q000111Q XX123456 XX123456 Q000111Q 123456 [id number]  Diastolic BP 90 84 78 80 80 80 76  Wt. (Lbs) 202 209 201.4 208 208 207 200  BMI 25.92 26.82 25.85 26.69 26.69 26.57 25.67

## 2015-06-15 NOTE — Telephone Encounter (Signed)
Error

## 2015-06-15 NOTE — Telephone Encounter (Signed)
Patient is calling asking to be seen for back pain, please advise?

## 2015-06-15 NOTE — Assessment & Plan Note (Addendum)
Uncontrolled.Toradol and depo medrol administered IM in the office , to be followed by a short course of oral prednisone and NSAIDS. X ray and start nightly gabapentin also Work excuse

## 2015-06-15 NOTE — Patient Instructions (Signed)
F/u in 6 weeks, call if you need me sooner  Injections and medication, prednisone and gabapentin sent for back pain. Take alleve one daily for  next 1 week also Pls get X ray of back today  BP is high, start new additional med amlodipine 5 mg daily today. Continue your current BP med , take at usual time of 3pm, oK to take amlodipine lat the same time along with this medication  Work excuse from yesterday to return 06/17/2015  Thank you  for choosing [facility]. We consider it a privelige to serve you.  Delivering excellent health care in a caring and  compassionate way is our goal.  Partnering with you,  so that together we can achieve this goal is our strategy.

## 2015-06-15 NOTE — Telephone Encounter (Signed)
Patient states that he strained back lifting mother who is now in a nursing home.  Was out of work yesterday and today.  States that pain in lumbar region of back.  Please advise no open appointments for today.

## 2015-06-15 NOTE — Telephone Encounter (Signed)
Can be seen as 11 am pt

## 2015-06-20 NOTE — Assessment & Plan Note (Signed)
Hyperlipidemia:Low fat diet discussed and encouraged.   Lipid Panel  Lab Results  Component Value Date   CHOL 268* 01/20/2015   HDL 52 01/20/2015   LDLCALC 158* 01/20/2015   LDLDIRECT 63 04/29/2012   TRIG 290* 01/20/2015   CHOLHDL 5.2* 01/20/2015  Uncontrolled Updated lab needed at/ before next visit.

## 2015-07-26 ENCOUNTER — Ambulatory Visit: Payer: BLUE CROSS/BLUE SHIELD | Admitting: Family Medicine

## 2015-08-03 ENCOUNTER — Ambulatory Visit (INDEPENDENT_AMBULATORY_CARE_PROVIDER_SITE_OTHER): Payer: BLUE CROSS/BLUE SHIELD | Admitting: Family Medicine

## 2015-08-03 ENCOUNTER — Encounter: Payer: Self-pay | Admitting: Family Medicine

## 2015-08-03 VITALS — BP 120/74 | HR 75 | Resp 16 | Ht 74.0 in | Wt 195.0 lb

## 2015-08-03 DIAGNOSIS — F32A Depression, unspecified: Secondary | ICD-10-CM

## 2015-08-03 DIAGNOSIS — E785 Hyperlipidemia, unspecified: Secondary | ICD-10-CM

## 2015-08-03 DIAGNOSIS — I10 Essential (primary) hypertension: Secondary | ICD-10-CM

## 2015-08-03 DIAGNOSIS — F329 Major depressive disorder, single episode, unspecified: Secondary | ICD-10-CM | POA: Insufficient documentation

## 2015-08-03 MED ORDER — FLUOXETINE HCL 20 MG PO TABS: 20.0000 mg | ORAL_TABLET | Freq: Every day | ORAL | Status: DC

## 2015-08-03 NOTE — Progress Notes (Signed)
   Subjective:    Patient ID: Tristan Cook, male    DOB: 1960/09/02, 55 y.o.   MRN: [id number]  HPI    Tristan Cook     MRN: [id number]      DOB: [birth year]   HPI Tristan Cook is here for evaluation of fatigue  There are no specific complaints   ROS Denies recent fever or chills. Denies sinus pressure, nasal congestion, ear pain or sore throat. Denies chest congestion, productive cough or wheezing. Denies chest pains, palpitations and leg swelling Denies abdominal pain, nausea, vomiting,diarrhea or constipation.   Denies dysuria, frequency, hesitancy or incontinence. Denies joint pain, swelling and limitation in mobility. Denies headaches, seizures, numbness, or tingling. Denies depression, anxiety or insomnia. Denies skin break down or rash.   PE  BP 120/74 mmHg  Pulse 75  Resp 16  Ht 6\' 2"  (1.88 m)  Wt 195 lb (88.451 kg)  BMI 25.03 kg/m2  SpO2 99%  Patient alert and oriented and in no cardiopulmonary distress.  HEENT: No facial asymmetry, EOMI,   oropharynx pink and moist.  Neck supple no JVD, no mass.  Chest: Clear to auscultation bilaterally.  CVS: S1, S2 no murmurs, no S3.Regular rate.  ABD: Soft non tender.   Ext: No edema  MS: Adequate ROM spine, shoulders, hips and knees.  Skin: Intact, no ulcerations or rash noted.  Psych: Good eye contact, normal affect. Memory intact not anxious or depressed appearing.  CNS: CN 2-12 intact, power,  normal throughout.no focal deficits noted.   Assessment & Plan   Depression Start fluoxetine daily, c/o excessive fatigue, awaiting further eval of low T, recently changed job and currently uninsured Increased anxiety and depressed, not suicidal or homicidal, main c/o sluggish feeling, recently had entire lab panel which only showed low testosterone, has started urology eval for this Work excuse for one day only, the day of the visit  Essential hypertension Controlled, no change in medication DASH diet and  commitment to daily physical activity for a minimum of 30 minutes discussed and encouraged, as a part of hypertension management. The importance of attaining a healthy weight is also discussed.  BP/Weight 08/03/2015 06/15/2015 06/03/2015 05/05/2015 03/29/2015 03/04/2015 [id number]  Systolic BP 123456 Q000111Q [id number] [id number] Q000111Q 123456 [id number]  Diastolic BP 74 90 84 78 80 80 80  Wt. (Lbs) 195 202 209 201.4 208 208 207  BMI 25.03 25.92 26.82 25.85 26.69 26.69 26.57        Hyperlipidemia LDL goal <100 Hyperlipidemia:Low fat diet discussed and encouraged.   Lipid Panel  Lab Results  Component Value Date   CHOL 268* 01/20/2015   HDL 52 01/20/2015   LDLCALC 158* 01/20/2015   LDLDIRECT 63 04/29/2012   TRIG 290* 01/20/2015   CHOLHDL 5.2* 01/20/2015            Review of Systems     Objective:   Physical Exam        Assessment & Plan:

## 2015-08-03 NOTE — Assessment & Plan Note (Addendum)
Start fluoxetine daily, c/o excessive fatigue, awaiting further eval of low T, recently changed job and currently uninsured Increased anxiety and depressed, not suicidal or homicidal, main c/o sluggish feeling, recently had entire lab panel which only showed low testosterone, has started urology eval for this Work excuse for one day only, the day of the visit

## 2015-08-03 NOTE — Patient Instructions (Signed)
F/u in 2 months, call if you need me sooner  Work excuse for today , return tomorrow  Start medication today please for depression, this will improve your overall feeling of wellbeing and energy  Hope you feel better soon  Continue vitamin D and B supplements  Although you need to have further urology evaluation, I do believe that starting mental health medication will benefit you tremendously  Thank you  for choosing [facility]. We consider it a privelige to serve you.  Delivering excellent health care in a caring and  compassionate way is our goal.  Partnering with you,  so that together we can achieve this goal is our strategy.

## 2015-08-09 ENCOUNTER — Other Ambulatory Visit: Payer: Self-pay | Admitting: Family Medicine

## 2015-08-09 NOTE — Assessment & Plan Note (Signed)
Controlled, no change in medication DASH diet and commitment to daily physical activity for a minimum of 30 minutes discussed and encouraged, as a part of hypertension management. The importance of attaining a healthy weight is also discussed.  BP/Weight 08/03/2015 06/15/2015 06/03/2015 05/05/2015 03/29/2015 03/04/2015 99991111  Systolic BP 123456 Q000111Q XX123456 [id number] Q000111Q 123456 XX123456  Diastolic BP 74 90 84 78 80 80 80  Wt. (Lbs) 195 202 209 201.4 208 208 207  BMI 25.03 25.92 26.82 25.85 26.69 26.69 26.57

## 2015-08-09 NOTE — Assessment & Plan Note (Signed)
Hyperlipidemia:Low fat diet discussed and encouraged.   Lipid Panel  Lab Results  Component Value Date   CHOL 268* 01/20/2015   HDL 52 01/20/2015   LDLCALC 158* 01/20/2015   LDLDIRECT 63 04/29/2012   TRIG 290* 01/20/2015   CHOLHDL 5.2* 01/20/2015

## 2015-09-02 ENCOUNTER — Other Ambulatory Visit: Payer: Self-pay

## 2015-09-02 ENCOUNTER — Telehealth: Payer: Self-pay | Admitting: Family Medicine

## 2015-09-02 MED ORDER — ALLOPURINOL 300 MG PO TABS: 300.0000 mg | ORAL_TABLET | Freq: Every day | ORAL | Status: DC

## 2015-09-02 NOTE — Telephone Encounter (Signed)
Medication refilled

## 2015-09-02 NOTE — Telephone Encounter (Signed)
Patient is asking for a refill on his allopurinol (ZYLOPRIM) 300 MG tablet  Please advise?

## 2015-09-20 ENCOUNTER — Ambulatory Visit (INDEPENDENT_AMBULATORY_CARE_PROVIDER_SITE_OTHER): Payer: Self-pay | Admitting: Gastroenterology

## 2015-09-20 ENCOUNTER — Encounter: Payer: Self-pay | Admitting: Gastroenterology

## 2015-09-20 VITALS — BP 117/75 | HR 99 | Temp 97.7°F | Wt 183.5 lb

## 2015-09-20 DIAGNOSIS — K649 Unspecified hemorrhoids: Secondary | ICD-10-CM

## 2015-09-20 NOTE — Assessment & Plan Note (Signed)
55 year old with what appears to be self-limiting episode of diarrhea, now improved. Prior to this small volume rectal bleeding, improved with preparation H. Unfortunately, he does not have insurance coverage; I have checked with [place] regarding out of pocket expense for both Anusol and the compounded special hemorrhoid cream by [employer], both ranging in the mid 40$. At this time, preparation H is most cost-effective. As he has had symptom improvement with this and I did not note any obvious abnormality on rectal exam, we will continue with this. However, I do recommend a colonoscopy as soon as he is able to acquire insurance, as we are not able to definitely attribute rectal bleeding to hemorrhoids at this time. He is due for adenoma surveillance later this year (Dec 2017), but I recommend this as soon as he is able. He understands this. I will have him return in 3 months, at which time hopefully we may proceed with arranging a colonoscopy.

## 2015-09-20 NOTE — Progress Notes (Signed)
Referring Provider: Fayrene Helper, MD Primary Care Physician:  Tula Nakayama, MD  Primary GI: Dr. Oneida Alar   Chief Complaint  Patient presents with  . Hemorrhoids    HPI:   Tristan Cook is a 55 y.o. male presenting today with a history of GERD and peptic stricture with dysphagia. He will be due for surveillance colonoscopy in Dec 2017. Last seen in our office Feb 2017.   Had lower abdominal discomfort last week with diarrhea. Lost about 8 lbs. Eating fruit, fruit drinks. Saturday took an anti-diarrheal pill, which helped. Baseline habits is once in morning and once in evening. Abdominal pain improved but still tender. Saw bright red blood awhile ago at [facility] and felt like it was his hemorrhoids. [place] a [place]. Has had issues with hemorrhoids since then. Using the preparation H. Feels burning. Feels like there is a scar in his rectum.   Due for surveillance colonoscopy in Dec 2017. Doesn't have insurance currently but hopes to return to work sometime this month.   Past Medical History  Diagnosis Date  . Hypertension   . High cholesterol   . Substance abuse     h/o excessive alcohol use; pt says he limits use to one to 2 beers daily he currently  . Neck pain   . Back pain   . Abdominal pain   . Adenomatous colon polyp 2008  . Constipation   . Gout   . Hemorrhoids     Past Surgical History  Procedure Laterality Date  . Hand surgery    . Colonoscopy  10/09/2006    3 mm pedunculated sigmoid colon polyp removed/8-mm sessile hepatic flexure polyp (tubular villous adenoma) removed/6-mm  descending colon polyp removed/small internal hemorrhoids  . Colonoscopy  02/28/2011    [id number], multiple in the rectum/Internal hemorrhoids, MODERATE-CAUSING RECTAL BLEEDING  . Flexible sigmoidoscopy  01/02/2012    SLF: 1. the colonic mucosa appeared normal in the sigmoid colon 2. Large internal hemorrhoids: cause for rectal bleeding/pain .  s/p banding X 3   . Esophagogastroduodenoscopy N/A 07/21/2014    SLF: 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 Mild erosive gastrtitis most likely due to indomethacin   . Savory dilation N/A 07/21/2014    Procedure: SAVORY DILATION;  Surgeon: [name], MD;  Location: [facility];  Service: Endoscopy;  Laterality: N/A;    Current Outpatient Prescriptions  Medication Sig Dispense Refill  . allopurinol (ZYLOPRIM) 300 MG tablet Take 1 tablet (300 mg total) by mouth daily. 30 tablet 6  . amLODipine (NORVASC) 5 MG tablet Take 1 tablet (5 mg total) by mouth daily. 30 tablet 3  . aspirin 81 MG tablet Take 1 tablet (81 mg total) by mouth daily. 30 tablet   . cholecalciferol (VITAMIN D) 1000 units tablet Take 1,000 Units by mouth daily.    [name] FLUoxetine (PROZAC) 20 MG tablet Take 1 tablet (20 mg total) by mouth daily. 30 tablet 3  . gabapentin (NEURONTIN) 100 MG capsule Take 1 capsule (100 mg total) by mouth at bedtime. 30 capsule 0  . lisinopril (PRINIVIL,ZESTRIL) 40 MG tablet Take 1 tablet (40 mg total) by mouth daily. 30 tablet 5  . loratadine (CLARITIN) 10 MG tablet Take 1 tablet (10 mg total) by mouth daily. 30 tablet 5  . lovastatin (MEVACOR) 40 MG tablet TAKE ONE TABLET BY MOUTH AT BEDTIME 30 tablet 3  . omeprazole (PRILOSEC) 20 MG capsule Take 1 capsule (  20 mg total) by mouth 2 (two) times daily before a meal. 180 capsule 3  . predniSONE (STERAPRED UNI-PAK 21 TAB) 5 MG (21) TBPK tablet Take 1 tablet (5 mg total) by mouth as directed. Use as directed 21 tablet 0  . temazepam (RESTORIL) 30 MG capsule Take 1 capsule (30 mg total) by mouth at bedtime as needed for sleep. 30 capsule 4  . [DISCONTINUED] lisinopril-hydrochlorothiazide (PRINZIDE,ZESTORETIC) 10-12.5 MG per tablet One tablet once daily Dose increase effective 04/23/2012 30 tablet 5   No current facility-administered medications for this visit.    Allergies as of 09/20/2015  . (No Known Allergies)    Family History   Problem Relation Age of Onset  . Hypertension Mother   . Hypertension Father   . Hypertension Sister   . Hypertension Brother   . Colon cancer Neg Hx     Social History   Social History  . Marital Status: Legally Separated    Spouse Name: N/A  . Number of Children: 0  . Years of Education: N/A   Occupational History  . unemployed, [place] History Main Topics  . Smoking status: Former Smoker -- 1.00 packs/day for 23 years    Types: Cigarettes    Quit date: 03/13/2004  . Smokeless tobacco: None     Comment: Quit since 2006  . Alcohol Use: No     Comment: Quit 2012. rare.  . Drug Use: No  . Sexual Activity: Not Asked   Other Topics Concern  . None   Social History Narrative    Review of Systems: As mentioned in HPI   Physical Exam: BP 117/75 mmHg  Pulse 99  Temp(Src) 97.7 F (36.5 C)  Wt 183 lb 8 oz (83.235 kg) General:   Alert and oriented. No distress noted. Pleasant and cooperative.  Head:  Normocephalic and atraumatic. Eyes:  Conjuctiva clear without scleral icterus. Mouth:  Oral mucosa pink and moist. Good dentition. No lesions. Abdomen:  +BS, soft, non-tender and non-distended. No rebound or guarding. No HSM or masses noted. Rectal: external non-thrombosed hemorrhoids noted, internal exam without mass. No obvious fissure  Msk:  Symmetrical without gross deformities. Normal posture. Extremities:  Without edema. Neurologic:  Alert and  oriented x4 Psych:  Alert and cooperative. Normal mood and affect.

## 2015-09-20 NOTE — Progress Notes (Signed)
cc'ed to pcp °

## 2015-09-20 NOTE — Patient Instructions (Signed)
Continue the preparation H.   Please call if you change your mind about the compounded [facility] cream.   We will see you in 3 months.  I recommend a colonoscopy before December, when you are able to proceed.

## 2015-09-27 ENCOUNTER — Ambulatory Visit: Payer: Self-pay | Admitting: Family Medicine

## 2015-10-06 ENCOUNTER — Ambulatory Visit (INDEPENDENT_AMBULATORY_CARE_PROVIDER_SITE_OTHER): Payer: Self-pay | Admitting: Urology

## 2015-10-06 DIAGNOSIS — N5201 Erectile dysfunction due to arterial insufficiency: Secondary | ICD-10-CM

## 2015-10-06 DIAGNOSIS — E291 Testicular hypofunction: Secondary | ICD-10-CM

## 2015-10-13 ENCOUNTER — Ambulatory Visit (INDEPENDENT_AMBULATORY_CARE_PROVIDER_SITE_OTHER): Payer: Self-pay | Admitting: Urology

## 2015-10-13 DIAGNOSIS — N5201 Erectile dysfunction due to arterial insufficiency: Secondary | ICD-10-CM

## 2015-10-13 DIAGNOSIS — E291 Testicular hypofunction: Secondary | ICD-10-CM

## 2015-10-27 ENCOUNTER — Ambulatory Visit (INDEPENDENT_AMBULATORY_CARE_PROVIDER_SITE_OTHER): Payer: Self-pay | Admitting: Urology

## 2015-10-27 DIAGNOSIS — E291 Testicular hypofunction: Secondary | ICD-10-CM

## 2015-10-27 DIAGNOSIS — N5201 Erectile dysfunction due to arterial insufficiency: Secondary | ICD-10-CM

## 2015-11-24 ENCOUNTER — Encounter: Payer: Self-pay | Admitting: Family Medicine

## 2015-11-24 ENCOUNTER — Ambulatory Visit (INDEPENDENT_AMBULATORY_CARE_PROVIDER_SITE_OTHER): Payer: Self-pay | Admitting: Family Medicine

## 2015-11-24 VITALS — BP 124/80 | HR 91 | Resp 16 | Ht 73.0 in | Wt 190.0 lb

## 2015-11-24 DIAGNOSIS — E785 Hyperlipidemia, unspecified: Secondary | ICD-10-CM

## 2015-11-24 DIAGNOSIS — Z125 Encounter for screening for malignant neoplasm of prostate: Secondary | ICD-10-CM

## 2015-11-24 DIAGNOSIS — M542 Cervicalgia: Secondary | ICD-10-CM

## 2015-11-24 DIAGNOSIS — I10 Essential (primary) hypertension: Secondary | ICD-10-CM

## 2015-11-24 MED ORDER — CYCLOBENZAPRINE HCL 10 MG PO TABS: ORAL_TABLET | ORAL | 0 refills | Status: DC

## 2015-11-24 MED ORDER — PREDNISONE 5 MG (21) PO TBPK: 5.0000 mg | ORAL_TABLET | ORAL | 0 refills | Status: DC

## 2015-11-24 NOTE — Assessment & Plan Note (Signed)
Hyperlipidemia:Low fat diet discussed and encouraged.   Lipid Panel  Lab Results  Component Value Date   CHOL 268 (H) 01/20/2015   HDL 52 01/20/2015   LDLCALC 158 (H) 01/20/2015   LDLDIRECT 63 04/29/2012   TRIG 290 (H) 01/20/2015   CHOLHDL 5.2 (H) 01/20/2015     Updated lab needed at/ before next visit.

## 2015-11-24 NOTE — Assessment & Plan Note (Signed)
Controlled, no change in medication DASH diet and commitment to daily physical activity for a minimum of 30 minutes discussed and encouraged, as a part of hypertension management. The importance of attaining a healthy weight is also discussed.  BP/Weight 11/24/2015 09/20/2015 08/03/2015 06/15/2015 06/03/2015 05/05/2015 [id number]  Systolic BP A999333 123XX123 123456 Q000111Q XX123456 XX123456 Q000111Q  Diastolic BP 80 75 74 90 84 78 80  Wt. (Lbs) 190 183.5 195 202 209 201.4 208  BMI 25.07 23.55 25.03 25.92 26.82 25.85 26.69

## 2015-11-24 NOTE — Progress Notes (Signed)
   Tristan Cook     MRN: [id number]      DOB: Aug 14, 1960   HPI [name] is here with a 1 week h/o neck pain and spasm radiating down left shoulder. unable to sleep last night due to pain, and not at work today as a result. No specific trigger, he has had this in the past ROS Denies recent fever or chills. Denies sinus pressure, nasal congestion, ear pain or sore throat. Denies chest congestion, productive cough or wheezing. Denies chest pains, palpitations and leg swelling Denies abdominal pain, nausea, vomiting,diarrhea or constipation.   Denies dysuria, frequency, hesitancy or incontinence.  Denies headaches, seizures, numbness, or tingling.  Denies skin break down or rash.   PE  BP 124/80   Pulse 91   Resp 16   Ht 6\' 1"  (1.854 m)   Wt 190 lb (86.2 kg)   SpO2 99%   BMI 25.07 kg/m   Patient alert and oriented and in no cardiopulmonary distress.  HEENT: No facial asymmetry, EOMI,   oropharynx pink and moist.  Neck decreased ROM with left neck spasm no JVD, no mass.  Chest: Clear to auscultation bilaterally.  CVS: S1, S2 no murmurs, no S3.Regular rate.  ABD: Soft non tender.   Ext: No edema  MS: Adequate ROM spine, shoulders, hips and knees.  Skin: Intact, no ulcerations or rash noted.  Psych: Good eye contact, normal affect. Memory intact not anxious or depressed appearing.  CNS: CN 2-12 intact, power,  normal throughout.no focal deficits noted.   Assessment & Plan  Essential hypertension Controlled, no change in medication DASH diet and commitment to daily physical activity for a minimum of 30 minutes discussed and encouraged, as a part of hypertension management. The importance of attaining a healthy weight is also discussed.  BP/Weight 11/24/2015 09/20/2015 08/03/2015 06/15/2015 06/03/2015 05/05/2015 [id number]  Systolic BP A999333 123XX123 123456 Q000111Q XX123456 XX123456 Q000111Q  Diastolic BP 80 75 74 90 84 78 80  Wt. (Lbs) 190 183.5 195 202 209 201.4 208  BMI 25.07 23.55 25.03  25.92 26.82 25.85 26.69       Neck pain on left side 1 week history with spasm, pred dose pack , as needed, muscle relaxanrt at bedtime , work note for 1 day  Hyperlipidemia LDL goal <130 Hyperlipidemia:Low fat diet discussed and encouraged.   Lipid Panel  Lab Results  Component Value Date   CHOL 268 (H) 01/20/2015   HDL 52 01/20/2015   LDLCALC 158 (H) 01/20/2015   LDLDIRECT 63 04/29/2012   TRIG 290 (H) 01/20/2015   CHOLHDL 5.2 (H) 01/20/2015     Updated lab needed at/ before next visit.

## 2015-11-24 NOTE — Assessment & Plan Note (Signed)
1 week history with spasm, pred dose pack , as needed, muscle relaxanrt at bedtime , work note for 1 day

## 2015-11-24 NOTE — Patient Instructions (Signed)
F/u early Dec, call if you need me sooner  Prednisone and flexeril sent for neck pain and spasm today Work excuse for today , return tomorrow  Pls get flu vaccine as soon as able  Fasting lipid, cmp , PSA dec 1 or after  Thank you  for choosing [facility]. We consider it a privelige to serve you.  Delivering excellent health care in a caring and  compassionate way is our goal.  Partnering with you,  so that together we can achieve this goal is our strategy.

## 2015-12-17 ENCOUNTER — Other Ambulatory Visit: Payer: Self-pay | Admitting: Family Medicine

## 2015-12-17 DIAGNOSIS — G4700 Insomnia, unspecified: Secondary | ICD-10-CM

## 2015-12-22 ENCOUNTER — Ambulatory Visit: Payer: Self-pay | Admitting: Urology

## 2015-12-22 ENCOUNTER — Ambulatory Visit: Payer: Self-pay | Admitting: Gastroenterology

## 2016-01-17 ENCOUNTER — Encounter: Payer: Self-pay | Admitting: Gastroenterology

## 2016-02-01 ENCOUNTER — Other Ambulatory Visit: Payer: Self-pay | Admitting: Family Medicine

## 2016-02-16 ENCOUNTER — Ambulatory Visit: Payer: Self-pay | Admitting: Urology

## 2016-02-21 ENCOUNTER — Encounter (HOSPITAL_COMMUNITY): Payer: Self-pay | Admitting: *Deleted

## 2016-02-21 ENCOUNTER — Encounter: Payer: Self-pay | Admitting: Family Medicine

## 2016-02-21 ENCOUNTER — Emergency Department (HOSPITAL_COMMUNITY)
Admission: EM | Admit: 2016-02-21 | Discharge: 2016-02-21 | Disposition: A | Discharge status: Home / Self Care | Payer: Self-pay | Attending: Emergency Medicine | Admitting: Emergency Medicine

## 2016-02-21 ENCOUNTER — Ambulatory Visit: Payer: Self-pay | Admitting: Family Medicine

## 2016-02-21 DIAGNOSIS — Z87891 Personal history of nicotine dependence: Secondary | ICD-10-CM | POA: Insufficient documentation

## 2016-02-21 DIAGNOSIS — I10 Essential (primary) hypertension: Secondary | ICD-10-CM | POA: Insufficient documentation

## 2016-02-21 DIAGNOSIS — Z79899 Other long term (current) drug therapy: Secondary | ICD-10-CM | POA: Insufficient documentation

## 2016-02-21 DIAGNOSIS — Z7982 Long term (current) use of aspirin: Secondary | ICD-10-CM | POA: Insufficient documentation

## 2016-02-21 DIAGNOSIS — G629 Polyneuropathy, unspecified: Secondary | ICD-10-CM | POA: Insufficient documentation

## 2016-02-21 LAB — COMPREHENSIVE METABOLIC PANEL
ALBUMIN: 3.9 g/dL (ref 3.5–5.0)
ALT: 35 U/L (ref 17–63)
ANION GAP: 11 (ref 5–15)
AST: 86 U/L — ABNORMAL HIGH (ref 15–41)
Alkaline Phosphatase: 74 U/L (ref 38–126)
BILIRUBIN TOTAL: 2 mg/dL — AB (ref 0.3–1.2)
BUN: 7 mg/dL (ref 6–20)
CO2: 25 mmol/L (ref 22–32)
Calcium: 9.4 mg/dL (ref 8.9–10.3)
Chloride: 100 mmol/L — ABNORMAL LOW (ref 101–111)
Creatinine, Ser: 1.31 mg/dL — ABNORMAL HIGH (ref 0.61–1.24)
GFR calc Af Amer: >60 mL/min (ref >60)
GFR calc non Af Amer: >60 mL/min (ref >60)
GLUCOSE: 98 mg/dL (ref 65–99)
POTASSIUM: 3.3 mmol/L — AB (ref 3.5–5.1)
Sodium: 136 mmol/L (ref 135–145)
TOTAL PROTEIN: 7.8 g/dL (ref 6.5–8.1)

## 2016-02-21 LAB — CBC WITH DIFFERENTIAL/PLATELET
BASOS PCT: 1 %
Basophils Absolute: 0 10*3/uL (ref 0.0–0.1)
Eosinophils Absolute: 0.1 10*3/uL (ref 0.0–0.7)
Eosinophils Relative: 2 %
HEMATOCRIT: 43.6 % (ref 39.0–52.0)
HEMOGLOBIN: 15.8 g/dL (ref 13.0–17.0)
LYMPHS ABS: 1.5 10*3/uL (ref 0.7–4.0)
Lymphocytes Relative: 36 %
MCH: 33.3 pg (ref 26.0–34.0)
MCHC: 36.2 g/dL — AB (ref 30.0–36.0)
MCV: 91.8 fL (ref 78.0–100.0)
MONOS PCT: 15 %
Monocytes Absolute: 0.6 10*3/uL (ref 0.1–1.0)
NEUTROS ABS: 1.9 10*3/uL (ref 1.7–7.7)
NEUTROS PCT: 46 %
Platelets: 259 10*3/uL (ref 150–400)
RBC: 4.75 MIL/uL (ref 4.22–5.81)
RDW: 13.3 % (ref 11.5–15.5)
WBC: 4.1 10*3/uL (ref 4.0–10.5)

## 2016-02-21 LAB — VITAMIN B12: Vitamin B-12: 719 pg/mL (ref 180–914)

## 2016-02-21 LAB — SEDIMENTATION RATE: SED RATE: 7 mm/h (ref 0–16)

## 2016-02-21 LAB — ETHANOL: ALCOHOL ETHYL (B): 15 mg/dL — AB (ref <5)

## 2016-02-21 LAB — TSH: TSH: 0.897 u[IU]/mL (ref 0.350–4.500)

## 2016-02-21 NOTE — ED Provider Notes (Signed)
[facility] DEPT Provider Note   CSN: [id number] Arrival date & time: 02/21/16  0359  Time seen 05:40 AM   History   Chief Complaint Chief Complaint  Patient presents with  . Numbness    HPI Tristan Cook is a 55 y.o. male.  HPI  patient reports waxing and waning numbness of both his feet for the past 2 months. He denies any history of diabetes, chemotherapy, and he denies heavy alcohol use although it is in his problem list. He states last week he had an episode where he was having difficulty walking but not now. He indicates it comes up into the lower third of his lower leg.  Patient states he gives himself testosterone injections every 2 weeks.  PCP Dr. Moshe Cipro  Past Medical History:  Diagnosis Date  . Abdominal pain   . Adenomatous colon polyp 2008  . Back pain   . Constipation   . Gout   . Hemorrhoids   . High cholesterol   . Hypertension   . Neck pain   . Substance abuse    h/o excessive alcohol use; pt says he limits use to one to 2 beers daily he currently    Patient Active Problem List   Diagnosis Date Noted  . Hemorrhoids 09/20/2015  . Depression 08/03/2015  . Back pain with radiation 06/15/2015  . Insomnia 06/03/2015  . Hyperlipidemia LDL goal <130 04/25/2015  . 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 Fatigue 03/29/2015  . Allergic rhinitis 02/10/2015  . Esophageal stricture 10/22/2014  . Overweight (BMI 25.0-29.9) 10/06/2013  . GERD (gastroesophageal reflux disease) 04/23/2012  . Hemorrhoids, internal 11/02/2011  . Essential hypertension 07/25/2007  . Neck pain on left side 07/25/2007    Past Surgical History:  Procedure Laterality Date  . COLONOSCOPY  10/09/2006   3 mm pedunculated sigmoid colon polyp removed/8-mm sessile hepatic flexure polyp (tubular villous adenoma) removed/6-mm  descending colon polyp removed/small internal hemorrhoids  . COLONOSCOPY  02/28/2011   [id number], multiple in the rectum/Internal hemorrhoids,  MODERATE-CAUSING RECTAL BLEEDING  . 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 Mild erosive gastrtitis most likely due to indomethacin   . FLEXIBLE SIGMOIDOSCOPY  01/02/2012   SLF: 1. the colonic mucosa appeared normal in the sigmoid colon 2. Large internal hemorrhoids: cause for rectal bleeding/pain . s/p banding X 3   . HAND SURGERY    . SAVORY DILATION N/A 07/21/2014   Procedure: SAVORY DILATION;  Surgeon: [name], MD;  Location: [facility];  Service: Endoscopy;  Laterality: N/A;       Home Medications    Prior to Admission medications   Medication Sig Start Date End Date Taking? Authorizing Provider  aspirin 81 MG tablet Take 1 tablet (81 mg total) by mouth daily. 10/06/13  Yes Fayrene Helper, MD  cholecalciferol (VITAMIN D) 1000 units tablet Take 1,000 Units by mouth daily.   Yes Historical Provider, MD  cyclobenzaprine (FLEXERIL) 10 MG tablet One tablet at bedtime, as needed, for spasm 11/24/15  Yes Fayrene Helper, MD  lisinopril (PRINIVIL,ZESTRIL) 40 MG tablet Take 1 tablet (40 mg total) by mouth daily. 06/03/15  Yes Fayrene Helper, MD  allopurinol (ZYLOPRIM) 300 MG tablet Take 1 tablet (300 mg total) by mouth daily. 09/02/15   Fayrene Helper, MD  amLODipine (NORVASC) 5 MG tablet Take 1 tablet (5 mg total) by mouth daily. 06/15/15   Fayrene Helper, MD  FLUoxetine (PROZAC) 20 MG tablet Take  1 tablet (20 mg total) by mouth daily. 08/03/15   Fayrene Helper, MD  lisinopril (PRINIVIL,ZESTRIL) 40 MG tablet Take 1 tablet (40 mg total) by mouth daily. 02/02/16   Raylene Everts, MD  loratadine (CLARITIN) 10 MG tablet Take 1 tablet (10 mg total) by mouth daily. 02/10/15   Fayrene Helper, MD  lovastatin (MEVACOR) 40 MG tablet TAKE ONE TABLET BY MOUTH AT BEDTIME 08/10/15   Fayrene Helper, MD  omeprazole (PRILOSEC) 20 MG capsule Take 1 capsule (20 mg total) by mouth 2 (two) times daily before a meal.  05/05/15   Mahala Menghini, PA-C  predniSONE (STERAPRED UNI-PAK 21 TAB) 5 MG (21) TBPK tablet Take 1 tablet (5 mg total) by mouth as directed. Use as directed 11/24/15   Fayrene Helper, MD  temazepam (RESTORIL) 30 MG capsule TAKE ONE CAPSULE BY MOUTH AT BEDTIME AS NEEDED FOR SLEEP 12/17/15   Fayrene Helper, MD    Family History Family History  Problem Relation Age of Onset  . Hypertension Mother   . Hypertension Father   . Hypertension Sister   . Hypertension Brother   . Colon cancer Neg Hx     Social History Social History  Substance Use Topics  . Smoking status: Former Smoker    Packs/day: 1.00    Years: 23.00    Types: Cigarettes    Quit date: 03/13/2004  . Smokeless tobacco: Current User     Comment: Quit since 2006  . Alcohol use No     Comment: Quit 2012. rare.  Unemployed Drinks 1-2 shots on the weekends   Allergies   Patient has no known allergies.   Review of Systems Review of Systems  All other systems reviewed and are negative.    Physical Exam Updated Vital Signs BP 134/88 (BP Location: Right Arm)   Pulse 120   Temp 98.2 F (36.8 C) (Oral)   Resp 16   Ht 6\' 2"  (1.88 m)   Wt 190 lb (86.2 kg)   SpO2 96%   BMI 24.39 kg/m   Vital signs normal except for tachycardia   Physical Exam  Constitutional: He appears well-developed and well-nourished.  HENT:  Head: Normocephalic and atraumatic.  Eyes: Conjunctivae and EOM are normal.  Neck: Normal range of motion.  Cardiovascular: Normal rate.   Pulmonary/Chest: Effort normal. No respiratory distress.  Musculoskeletal: He exhibits no edema or deformity.  Moves all extremities well. Patient complains of decreased sensation to light touch on the dorsum of both his feet. His lower legs seem to be normal. Babinski is negative bilaterally. There is no edema or deformity seen.     ED Treatments / Results  Labs (all labs ordered are listed, but only abnormal results are displayed) Results for orders  placed or performed during the hospital encounter of 02/21/16  Comprehensive metabolic panel  Result Value Ref Range   Sodium 136 135 - 145 mmol/L   Potassium 3.3 (L) 3.5 - 5.1 mmol/L   Chloride 100 (L) 101 - 111 mmol/L   CO2 25 22 - 32 mmol/L   Glucose, Bld 98 65 - 99 mg/dL   BUN 7 6 - 20 mg/dL   Creatinine, Ser 1.31 (H) 0.61 - 1.24 mg/dL   Calcium 9.4 8.9 - 10.3 mg/dL   Total Protein 7.8 6.5 - 8.1 g/dL   Albumin 3.9 3.5 - 5.0 g/dL   AST 86 (H) 15 - 41 U/L   ALT 35 17 - 63 U/L  Alkaline Phosphatase 74 38 - 126 U/L   Total Bilirubin 2.0 (H) 0.3 - 1.2 mg/dL   GFR calc non Af Amer >60 >60 mL/min   GFR calc Af Amer >60 >60 mL/min   Anion gap 11 5 - 15  Ethanol  Result Value Ref Range   Alcohol, Ethyl (B) 15 (H) <5 mg/dL  CBC with Differential  Result Value Ref Range   WBC 4.1 4.0 - 10.5 K/uL   RBC 4.75 4.22 - 5.81 MIL/uL   Hemoglobin 15.8 13.0 - 17.0 g/dL   HCT 43.6 39.0 - 52.0 %   MCV 91.8 78.0 - 100.0 fL   MCH 33.3 26.0 - 34.0 pg   MCHC 36.2 (H) 30.0 - 36.0 g/dL   RDW 13.3 11.5 - 15.5 %   Platelets 259 150 - 400 K/uL   Neutrophils Relative % 46 %   Neutro Abs 1.9 1.7 - 7.7 K/uL   Lymphocytes Relative 36 %   Lymphs Abs 1.5 0.7 - 4.0 K/uL   Monocytes Relative 15 %   Monocytes Absolute 0.6 0.1 - 1.0 K/uL   Eosinophils Relative 2 %   Eosinophils Absolute 0.1 0.0 - 0.7 K/uL   Basophils Relative 1 %   Basophils Absolute 0.0 0.0 - 0.1 K/uL  TSH  Result Value Ref Range   TSH 0.897 0.350 - 4.500 uIU/mL   Laboratory interpretation all normal except mild hypokalemia   Procedures Procedures (including critical care time)  Medications Ordered in ED Medications - No data to display   Initial Impression / Assessment and Plan / ED Course  I have reviewed the triage vital signs and the nursing notes.  Pertinent labs & imaging results that were available during my care of the patient were reviewed by me and considered in my medical decision making (see chart for  details).  Clinical Course    I have explained to patient this is not an emergency evaluation. I did some basic blood work however he will need follow-up with either his PCP or neurologist to get peripheral nerve studies done to help clarify the etiology of the numbness in his feet.  7:45 AM patient was given his test results letter resulted so far, his vitamin B-12 and sedimentation rate are still pending. This can be followed up by his PCP. He was referred to neurology to get nerve conduction studies done to evaluate his assumed peripheral neuropathy. He was advised to quit drinking alcohol.   Review of patient's prior record shows he has several ED visits in 2013 through 2015 for pain either in his left foot or right foot. He reports this is different and those episodes were from gout.    Final Clinical Impressions(s) / ED Diagnoses   Final diagnoses:  Peripheral polyneuropathy [facility])    Plan discharge  Rolland Porter, MD, Barbette Or, MD 02/21/16 [phone]

## 2016-02-21 NOTE — ED Triage Notes (Signed)
Pt states numbness in feet for the past 2 months & wants to be checked out.

## 2016-02-21 NOTE — Discharge Instructions (Signed)
Please follow up with the neurologist to get further testing to evaluate the numbness in your feet and legs. NO MORE ALCOHOL!!!

## 2016-02-28 ENCOUNTER — Emergency Department (HOSPITAL_COMMUNITY)
Admission: EM | Admit: 2016-02-28 | Discharge: 2016-02-28 | Disposition: A | Discharge status: Home / Self Care | Payer: Self-pay | Attending: Emergency Medicine | Admitting: Emergency Medicine

## 2016-02-28 ENCOUNTER — Encounter (HOSPITAL_COMMUNITY): Payer: Self-pay | Admitting: Emergency Medicine

## 2016-02-28 ENCOUNTER — Emergency Department (HOSPITAL_COMMUNITY): Payer: Self-pay

## 2016-02-28 DIAGNOSIS — E876 Hypokalemia: Secondary | ICD-10-CM

## 2016-02-28 DIAGNOSIS — Z79899 Other long term (current) drug therapy: Secondary | ICD-10-CM | POA: Insufficient documentation

## 2016-02-28 DIAGNOSIS — Z7982 Long term (current) use of aspirin: Secondary | ICD-10-CM | POA: Insufficient documentation

## 2016-02-28 DIAGNOSIS — N289 Disorder of kidney and ureter, unspecified: Secondary | ICD-10-CM

## 2016-02-28 DIAGNOSIS — Z139 Encounter for screening, unspecified: Secondary | ICD-10-CM

## 2016-02-28 DIAGNOSIS — I10 Essential (primary) hypertension: Secondary | ICD-10-CM | POA: Insufficient documentation

## 2016-02-28 DIAGNOSIS — R42 Dizziness and giddiness: Secondary | ICD-10-CM

## 2016-02-28 DIAGNOSIS — F17290 Nicotine dependence, other tobacco product, uncomplicated: Secondary | ICD-10-CM | POA: Insufficient documentation

## 2016-02-28 LAB — COMPREHENSIVE METABOLIC PANEL
ALK PHOS: 91 U/L (ref 38–126)
ALT: 61 U/L (ref 17–63)
AST: 160 U/L — AB (ref 15–41)
Albumin: 3.8 g/dL (ref 3.5–5.0)
Anion gap: 13 (ref 5–15)
BUN: 18 mg/dL (ref 6–20)
CHLORIDE: 91 mmol/L — AB (ref 101–111)
CO2: 22 mmol/L (ref 22–32)
CREATININE: 2.17 mg/dL — AB (ref 0.61–1.24)
Calcium: 9 mg/dL (ref 8.9–10.3)
GFR calc Af Amer: 38 mL/min — ABNORMAL LOW (ref >60)
GFR calc non Af Amer: 33 mL/min — ABNORMAL LOW (ref >60)
GLUCOSE: 104 mg/dL — AB (ref 65–99)
Potassium: 3.1 mmol/L — ABNORMAL LOW (ref 3.5–5.1)
SODIUM: 126 mmol/L — AB (ref 135–145)
Total Bilirubin: 0.9 mg/dL (ref 0.3–1.2)
Total Protein: 7.4 g/dL (ref 6.5–8.1)

## 2016-02-28 LAB — I-STAT TROPONIN, ED: TROPONIN I, POC: 0 ng/mL (ref 0.00–0.08)

## 2016-02-28 LAB — CBC WITH DIFFERENTIAL/PLATELET
Basophils Absolute: 0 10*3/uL (ref 0.0–0.1)
Basophils Relative: 1 %
EOS ABS: 0.1 10*3/uL (ref 0.0–0.7)
Eosinophils Relative: 2 %
HCT: 40 % (ref 39.0–52.0)
HEMOGLOBIN: 14.6 g/dL (ref 13.0–17.0)
LYMPHS ABS: 2.1 10*3/uL (ref 0.7–4.0)
Lymphocytes Relative: 29 %
MCH: 34.8 pg — AB (ref 26.0–34.0)
MCHC: 36.5 g/dL — AB (ref 30.0–36.0)
MCV: 95.5 fL (ref 78.0–100.0)
MONOS PCT: 13 %
Monocytes Absolute: 0.9 10*3/uL (ref 0.1–1.0)
Neutro Abs: 4 10*3/uL (ref 1.7–7.7)
Neutrophils Relative %: 55 %
Platelets: 244 10*3/uL (ref 150–400)
RBC: 4.19 MIL/uL — ABNORMAL LOW (ref 4.22–5.81)
RDW: 13.7 % (ref 11.5–15.5)
WBC: 7.3 10*3/uL (ref 4.0–10.5)

## 2016-02-28 LAB — GLUCOSE, POCT (MANUAL RESULT ENTRY): POC GLUCOSE: 121 mg/dL — AB (ref 70–99)

## 2016-02-28 MED ORDER — FAMOTIDINE 20 MG PO TABS: 20.0000 mg | ORAL_TABLET | Freq: Two times a day (BID) | ORAL | 0 refills | Status: DC

## 2016-02-28 MED ORDER — PANTOPRAZOLE SODIUM 40 MG IV SOLR
40.0000 mg | Freq: Once | INTRAVENOUS | Status: AC
Start: 2016-02-28 — End: 2016-02-28
  Administered 2016-02-28: 40 mg via INTRAVENOUS
  Filled 2016-02-28: qty 40

## 2016-02-28 MED ORDER — SODIUM CHLORIDE 0.9 % IV BOLUS (SEPSIS)
500.0000 mL | Freq: Once | INTRAVENOUS | Status: AC
  Administered 2016-02-28: 500 mL via INTRAVENOUS

## 2016-02-28 MED ORDER — SODIUM CHLORIDE 0.9 % IV SOLN: INTRAVENOUS | Status: DC

## 2016-02-28 NOTE — Discharge Instructions (Signed)
Return for any new or worse symptoms. Keep your follow-up appointment with the free clinic. Understanding and no longer followed by Dr. Moshe Cipro. Today's findings showed some renal insufficiency need to have your kidney function rechecked. Also showed slightly low potassium. Need to have that rechecked. Otherwise workup was negative. Recommend Pepcid for the reflux. Prescription provided or you can get it over-the-counter.

## 2016-02-28 NOTE — ED Notes (Signed)
**  Vital signs entered at 2011, were not Tristan Cook.**

## 2016-02-28 NOTE — ED Triage Notes (Signed)
Pt C/O dizziness and low BP that started yesterday.

## 2016-02-28 NOTE — ED Notes (Signed)
ED Provider at bedside. 

## 2016-02-28 NOTE — ED Provider Notes (Signed)
[facility] DEPT Provider Note   CSN: [id number] Arrival date & time: 02/28/16  1942 By signing my name below, I, Georgette Shell, attest that this documentation has been prepared under the direction and in the presence of Fredia Sorrow, MD. Electronically Signed: Georgette Shell, ED Scribe. 02/28/16. 9:32 PM.  History   Chief Complaint Chief Complaint  Patient presents with  . Dizziness   HPI Comments: Tristan Cook is a 55 y.o. male with h/o HTN who presents to the Emergency Department complaining of intermittent dizziness onset one day ago. Pt also has a cough and rhinorrhea. Pt states his blood pressure at home was slightly elevated at 191/61. His symptoms are exacerbated with activity. He has not taken any OTC medications PTA. Denies h/o similar symptoms. He has been compliant with his medications and denies any recent medication changes. Pt denies fever, congestion, sore throat, visual disturbance, shortness of breath, chest pain, leg swelling, abdominal pain, diarrhea, nausea, dysuria, hematuria, back pain, neck pain, rash, headache, and bruising easily.  The history is provided by the patient. No language interpreter was used.    Past Medical History:  Diagnosis Date  . Abdominal pain   . Adenomatous colon polyp 2008  . Back pain   . Constipation   . Gout   . Hemorrhoids   . High cholesterol   . Hypertension   . Neck pain   . Substance abuse    h/o excessive alcohol use; pt says he limits use to one to 2 beers daily he currently    Patient Active Problem List   Diagnosis Date Noted  . Hemorrhoids 09/20/2015  . Depression 08/03/2015  . Back pain with radiation 06/15/2015  . Insomnia 06/03/2015  . Hyperlipidemia LDL goal <130 04/25/2015  . 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 Fatigue 03/29/2015  . Allergic rhinitis 02/10/2015  . Esophageal stricture 10/22/2014  . Overweight (BMI 25.0-29.9) 10/06/2013  . GERD (gastroesophageal reflux disease) 04/23/2012  .  Hemorrhoids, internal 11/02/2011  . Essential hypertension 07/25/2007  . Neck pain on left side 07/25/2007    Past Surgical History:  Procedure Laterality Date  . COLONOSCOPY  10/09/2006   3 mm pedunculated sigmoid colon polyp removed/8-mm sessile hepatic flexure polyp (tubular villous adenoma) removed/6-mm  descending colon polyp removed/small internal hemorrhoids  . COLONOSCOPY  02/28/2011   [id number], multiple in the rectum/Internal hemorrhoids, MODERATE-CAUSING RECTAL BLEEDING  . 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 Mild erosive gastrtitis most likely due to indomethacin   . FLEXIBLE SIGMOIDOSCOPY  01/02/2012   SLF: 1. the colonic mucosa appeared normal in the sigmoid colon 2. Large internal hemorrhoids: cause for rectal bleeding/pain . s/p banding X 3   . HAND SURGERY    . SAVORY DILATION N/A 07/21/2014   Procedure: SAVORY DILATION;  Surgeon: [name], MD;  Location: [facility];  Service: Endoscopy;  Laterality: N/A;       Home Medications    Prior to Admission medications   Medication Sig Start Date End Date Taking? Authorizing Provider  allopurinol (ZYLOPRIM) 300 MG tablet Take 1 tablet (300 mg total) by mouth daily. 09/02/15   Fayrene Helper, MD  amLODipine (NORVASC) 5 MG tablet Take 1 tablet (5 mg total) by mouth daily. 06/15/15   Fayrene Helper, MD  aspirin 81 MG tablet Take 1 tablet (81 mg total) by mouth daily. 10/06/13   Fayrene Helper, MD  cholecalciferol (VITAMIN D) 1000 units tablet Take 1,000 Units by  mouth daily.    Historical Provider, MD  cyclobenzaprine (FLEXERIL) 10 MG tablet One tablet at bedtime, as needed, for spasm 11/24/15   Fayrene Helper, MD  famotidine (PEPCID) 20 MG tablet Take 1 tablet (20 mg total) by mouth 2 (two) times daily. 02/28/16   Fredia Sorrow, MD  FLUoxetine (PROZAC) 20 MG tablet Take 1 tablet (20 mg total) by mouth daily. 08/03/15   Fayrene Helper, MD    lisinopril (PRINIVIL,ZESTRIL) 40 MG tablet Take 1 tablet (40 mg total) by mouth daily. 06/03/15   Fayrene Helper, MD  lisinopril (PRINIVIL,ZESTRIL) 40 MG tablet Take 1 tablet (40 mg total) by mouth daily. 02/02/16   Raylene Everts, MD  loratadine (CLARITIN) 10 MG tablet Take 1 tablet (10 mg total) by mouth daily. 02/10/15   Fayrene Helper, MD  lovastatin (MEVACOR) 40 MG tablet TAKE ONE TABLET BY MOUTH AT BEDTIME 08/10/15   Fayrene Helper, MD  omeprazole (PRILOSEC) 20 MG capsule Take 1 capsule (20 mg total) by mouth 2 (two) times daily before a meal. 05/05/15   Mahala Menghini, PA-C  predniSONE (STERAPRED UNI-PAK 21 TAB) 5 MG (21) TBPK tablet Take 1 tablet (5 mg total) by mouth as directed. Use as directed 11/24/15   Fayrene Helper, MD  temazepam (RESTORIL) 30 MG capsule TAKE ONE CAPSULE BY MOUTH AT BEDTIME AS NEEDED FOR SLEEP 12/17/15   Fayrene Helper, MD    Family History Family History  Problem Relation Age of Onset  . Hypertension Mother   . Hypertension Father   . Hypertension Sister   . Hypertension Brother   . Colon cancer Neg Hx     Social History Social History  Substance Use Topics  . Smoking status: Former Smoker    Packs/day: 1.00    Years: 23.00    Types: Cigarettes    Quit date: 03/13/2004  . Smokeless tobacco: Current User     Comment: Quit since 2006  . Alcohol use No     Comment: Quit 2012. rare.     Allergies   Patient has no known allergies.   Review of Systems Review of Systems  Constitutional: Negative for fever.  HENT: Positive for rhinorrhea. Negative for congestion and sore throat.   Eyes: Negative for visual disturbance.  Respiratory: Positive for cough. Negative for shortness of breath.   Cardiovascular: Negative for chest pain and leg swelling.  Gastrointestinal: Positive for vomiting (baseline). Negative for abdominal pain, diarrhea and nausea.  Genitourinary: Negative for dysuria and hematuria.  Musculoskeletal: Negative for  back pain and neck pain.  Skin: Negative for rash.  Neurological: Positive for dizziness. Negative for headaches.  Hematological: Does not bruise/bleed easily.  Psychiatric/Behavioral: Negative for confusion.     Physical Exam Updated Vital Signs BP 121/74 (BP Location: Right Arm)   Pulse 102   Temp 99.2 F (37.3 C) (Oral)   Resp 19   Ht 6\' 2"  (1.88 m)   Wt 84.4 kg   SpO2 99%   BMI 23.88 kg/m   Physical Exam  Constitutional: He is oriented to person, place, and time. He appears well-developed and well-nourished.  HENT:  Head: Normocephalic.  Mouth/Throat: Oropharynx is clear and moist.  Eyes: Conjunctivae and EOM are normal. Pupils are equal, round, and reactive to light. No scleral icterus.  Cardiovascular: Normal rate, regular rhythm and normal heart sounds.  Exam reveals no gallop and no friction rub.   No murmur heard. Pulmonary/Chest: Effort normal and breath sounds normal.  No respiratory distress. He has no wheezes. He has no rales.  Abdominal: Soft. Bowel sounds are normal. He exhibits no distension. There is no tenderness.  Musculoskeletal: Normal range of motion. He exhibits no edema.  Neurological: He is alert and oriented to person, place, and time. No cranial nerve deficit or sensory deficit. He exhibits normal muscle tone. Coordination normal.  Skin: Skin is warm and dry.  Psychiatric: He has a normal mood and affect. His behavior is normal.  Nursing note and vitals reviewed.  ED Treatments / Results  DIAGNOSTIC STUDIES: Oxygen Saturation is 96% on RA, adequate by my interpretation.    COORDINATION OF CARE: 9:32 PM Discussed treatment plan with pt at bedside which includes lab work and pt agreed to plan.  Labs (all labs ordered are listed, but only abnormal results are displayed) Labs Reviewed  COMPREHENSIVE METABOLIC PANEL - Abnormal; Notable for the following:       Result Value   Sodium 126 (*)    Potassium 3.1 (*)    Chloride 91 (*)    Glucose, Bld  104 (*)    Creatinine, Ser 2.17 (*)    AST 160 (*)    GFR calc non Af Amer 33 (*)    GFR calc Af Amer 38 (*)    All other components within normal limits  CBC WITH DIFFERENTIAL/PLATELET - Abnormal; Notable for the following:    RBC 4.19 (*)    MCH 34.8 (*)    MCHC 36.5 (*)    All other components within normal limits  I-STAT TROPOININ, ED    EKG  EKG Interpretation  Date/Time:  Monday February 28 2016 22:06:48 EST Ventricular Rate:  103 PR Interval:    QRS Duration: 108 QT Interval:  379 QTC Calculation: 497 R Axis:   63 Text Interpretation:  Sinus tachycardia Borderline prolonged QT interval No significant change since last tracing Confirmed by Kainat Pizana  MD, Denny Lave [id number]) on 02/28/2016 10:39:32 PM       Radiology Dg Chest 2 View  Result Date: 02/28/2016 CLINICAL DATA:  Initial evaluation for acute dizziness with hypotension. EXAM: CHEST  2 VIEW COMPARISON:  Prior radiograph from 03/29/2015. FINDINGS: The cardiac and mediastinal silhouettes are stable in size and contour, and remain within normal limits. The lungs are normally inflated. No airspace consolidation, pleural effusion, or pulmonary edema is identified. There is no pneumothorax. No acute osseous abnormality identified. IMPRESSION: No active cardiopulmonary disease. Electronically Signed   By: Jeannine Boga M.D.   On: 02/28/2016 22:04    Procedures Procedures (including critical care time)  Medications Ordered in ED Medications  0.9 %  sodium chloride infusion (not administered)  sodium chloride 0.9 % bolus 500 mL (500 mLs Intravenous New Bag/Given 02/28/16 2147)  pantoprazole (PROTONIX) injection 40 mg (40 mg Intravenous Given 02/28/16 2220)     Initial Impression / Assessment and Plan / ED Course  I have reviewed the triage vital signs and the nursing notes.  Pertinent labs & imaging results that were available during my care of the patient were reviewed by me and considered in my medical decision  making (see chart for details).  Clinical Course     Patient presents with dizziness and concern for low blood pressure. However blood pressures have been fine here. Patient is not on a diuretic. Workup shows some renal insufficiency with an elevated creatinine potassium slightly low at 3.1. Potassium slightly low at 126. Not sure why these are often it is not on a diuretic.  Patient felt better with fluids patient also treated with Protonix for his known reflux disease. We'll continue him on Pepcid. Patient no longer followed by Dr. Moshe Cipro. But [facility] follow-up with [facility]. Where he can have his labs rechecked. Patient's dizziness not consistent with vertigo. Not concerned about stroke symptoms.  Final Clinical Impressions(s) / ED Diagnoses   Final diagnoses:  Dizziness  Renal insufficiency  Hypokalemia    New Prescriptions New Prescriptions   FAMOTIDINE (PEPCID) 20 MG TABLET    Take 1 tablet (20 mg total) by mouth 2 (two) times daily.   I personally performed the services described in this documentation, which was scribed in my presence. The recorded information has been reviewed and is accurate.       Fredia Sorrow, MD 02/28/16 [phone]

## 2016-02-28 NOTE — ED Notes (Signed)
Patient transported to Ultrasound 

## 2016-02-28 NOTE — Congregational Nurse Program (Signed)
Congregational Nurse Program Note  Date of Encounter: 02/28/2016  Past Medical History: Past Medical History:  Diagnosis Date  . Abdominal pain   . Adenomatous colon polyp 2008  . Back pain   . Constipation   . Gout   . Hemorrhoids   . High cholesterol   . Hypertension   . Neck pain   . Substance abuse    h/o excessive alcohol use; pt says he limits use to one to 2 beers daily he currently    Encounter Details:     CNP Questionnaire - 02/28/16 1030      Patient Demographics   Is this a new or existing patient? New   Patient is considered a/an Not Applicable   Race African-American/Black     Patient Assistance   Location of Patient [place]   Patient's financial/insurance status Low Income;Self-Pay (Uninsured)   Uninsured Patient ([place]) Yes   Interventions Counseled to make appt. with provider;Assisted patient in making appt.   Patient referred to apply for the following financial assistance [place] insecurities addressed Referred to food bank or resource   Transportation assistance No   Assistance securing medications No   Educational health offerings Behavioral health;Chronic disease;Navigating the healthcare system;Nutrition;Hypertension     Encounter Details   Primary purpose of visit Chronic Illness/Condition Visit;Acute Illness/Condition Visit;Education/Health Concerns;Navigating the Healthcare System;Post ED/Hospitalization Visit   Was an Emergency Department visit averted? Not Applicable   Does patient have a medical provider? No  formerly with DR Moshe Cipro , but now no income or insurance   Patient referred to [place] [facility]   Was a mental health screening completed? (GAINS tool) No   Does patient have dental issues? No   Does patient have vision issues? No   Does your patient have an abnormal blood pressure today? No   Since previous encounter, have you referred patient for abnormal blood  pressure that resulted in a new diagnosis or medication change? No   Does your patient have an abnormal blood glucose today? Yes  fasting , not eaten in 3 days , [facility]   Since previous encounter, have you referred patient for abnormal blood glucose that resulted in a new diagnosis or medication change? No   Was there a life-saving intervention made? No     New client to [employer], states he was recently in the [place] and was referred to come to [facility] for navigation into a primary care provider. Chief complaint today is 2 month history of "burning of his feet" He reports he has taken oxycodone he had at home with relief and states now they are just "numb", Denies any injury to his lower back. Denies any leg or lower back pain. Gait appears steady. He states he was told he needed a specialist referral. He also has been a patient of Dr Moshe Cipro and he states he last saw her 6 months ago. He is a poor historian for his medications and he doesn't know doses and he did not bring his medication bottles.  He reports: lisinopril, a "depression medicine"(out of) a "cholesterol medicine" (out of), allopurinol for gout(out of), "sleep medicine" Vitamin D and vitamin E.  RN counseled client to take his medication bottles to his appointments even if empty. Past Medical History: Arthritis left knee, gout, Depression, hyperlipidemia, hypertension, ? Cervical disc issues, GERD,  Past surgical history: ligament surgery on two fingers right hand.  Mental Health: Denies past suicide attempts, denies current thoughts of suicide. States he has not eaten in 3 days due to depression, not wanting to fix the food he has. Increased stress of unemployment, possibly losing home and car. Discussed options for counseling , but client does not wish to go that at this time. [employer] contact information /crisis number given to client. Social: Client lives alone, his mother is  in [facility]. Client states he is trying to find a way to keep his home.and help with his mortgage. He states he is caught up currently on utilities. Client denies drug use, quit smoking in 2006 . He does report alcohol use on the weekends of beer "not much". RN discussed options for primary medical care and [facility] due to [facility] proximity to his home. Appointment secured for December 19 th at 9:45 am. Contact information given to client for the [facility] and client encouraged to take all medicine bottles with him for his appointment. RN also counseled client to go to [facility] and talk with Adline Potter regarding his recent ER bill and to begin application process for [facility] application for possible financial assistance. RN also discussed options for supplementing his food and discussed the food pantry at the [employer] in [place] and also discussed possible future housing options and applying with the [employer] if needed. Client states he is applying for disability and has applied for food stamps. RN counseled client on the need to eat for his health and he states he was going to eat a sandwich when he left. RN gave client a granola bar to eat in his car. Also gave client educational information on hypertension, stress management, healthy tips and cholesterol. RN contact information given to client for any future needs or assistance and will follow up with client after his medical [facility].

## 2016-02-28 NOTE — ED Notes (Signed)
Patient transported to X-ray 

## 2016-02-29 ENCOUNTER — Encounter: Payer: Self-pay | Admitting: Physician Assistant

## 2016-02-29 ENCOUNTER — Ambulatory Visit: Payer: Self-pay | Admitting: Physician Assistant

## 2016-02-29 VITALS — BP 90/52 | HR 114 | Temp 97.9°F | Ht 73.0 in | Wt 184.8 lb

## 2016-02-29 DIAGNOSIS — E876 Hypokalemia: Secondary | ICD-10-CM

## 2016-02-29 DIAGNOSIS — F329 Major depressive disorder, single episode, unspecified: Secondary | ICD-10-CM

## 2016-02-29 DIAGNOSIS — Z131 Encounter for screening for diabetes mellitus: Secondary | ICD-10-CM

## 2016-02-29 DIAGNOSIS — R748 Abnormal levels of other serum enzymes: Secondary | ICD-10-CM

## 2016-02-29 DIAGNOSIS — Z125 Encounter for screening for malignant neoplasm of prostate: Secondary | ICD-10-CM

## 2016-02-29 DIAGNOSIS — E785 Hyperlipidemia, unspecified: Secondary | ICD-10-CM

## 2016-02-29 DIAGNOSIS — Z789 Other specified health status: Secondary | ICD-10-CM

## 2016-02-29 DIAGNOSIS — F32A Depression, unspecified: Secondary | ICD-10-CM

## 2016-02-29 DIAGNOSIS — I10 Essential (primary) hypertension: Secondary | ICD-10-CM

## 2016-02-29 DIAGNOSIS — K219 Gastro-esophageal reflux disease without esophagitis: Secondary | ICD-10-CM

## 2016-02-29 DIAGNOSIS — Z720 Tobacco use: Secondary | ICD-10-CM

## 2016-02-29 MED ORDER — POTASSIUM CHLORIDE CRYS ER 20 MEQ PO TBCR: 20.0000 meq | EXTENDED_RELEASE_TABLET | Freq: Two times a day (BID) | ORAL | 0 refills | Status: DC

## 2016-02-29 MED ORDER — POTASSIUM CHLORIDE CRYS ER 20 MEQ PO TBCR: 20.0000 meq | EXTENDED_RELEASE_TABLET | Freq: Every day | ORAL | 0 refills | Status: DC

## 2016-02-29 MED ORDER — FLUOXETINE HCL 20 MG PO TABS: 20.0000 mg | ORAL_TABLET | Freq: Every day | ORAL | 0 refills | Status: DC

## 2016-02-29 NOTE — Patient Instructions (Signed)
Rx- potassium once daily for 3 days Rx- pick up pepcid (famotidine- ER sent yesterday) Rx- restart prozac (fluoxetine)  Push water! Avoid alcohol  con[facility] for depression  LABWORK ON Friday- FASTING

## 2016-02-29 NOTE — Progress Notes (Signed)
BP (!) 90/52 (BP Location: Left Arm, Patient Position: Sitting, Cuff Size: Normal)   Pulse (!) 114   Temp 97.9 F (36.6 C)   Ht 6\' 1"  (1.854 m)   Wt 184 lb 12 oz (83.8 kg)   SpO2 98%   BMI 24.37 kg/m    Subjective:    Patient ID: Tristan Cook, male    DOB: 1960/03/29, 55 y.o.   MRN: [id number]  HPI: Tristan Cook is a 55 y.o. male presenting on 02/29/2016 for New Patient (Initial Visit)   HPI   Pt says he hasn't had insurance for a while.  He was seeing dr Moshe Cipro as self pay.   Pt says when he went to ER yesterday, his BP at home was 91/61, NOT 191/61  Pt was given Rx for pepcid yesterday in ER which he has not picked up yet.  He was given nothing for his low K+  Pt admits to depression.  He was on prozac in the past and he ran out.  His brother is in the hospital and they don't think he will live.  Pt admits to decreased appetite and hasn't been drinking much fluids lately either.   Pt denies hx etoh and says he was never heavy drinker.  Says he doesn't drink now but had etoh 2 wk ago (labs)  Relevant past medical, surgical, family and social history reviewed and updated as indicated. Interim medical history since our last visit reviewed. Allergies and medications reviewed and updated.   Current Outpatient Prescriptions:  .  allopurinol (ZYLOPRIM) 300 MG tablet, Take 1 tablet (300 mg total) by mouth daily., Disp: 30 tablet, Rfl: 6 .  aspirin 81 MG tablet, Take 1 tablet (81 mg total) by mouth daily., Disp: 30 tablet, Rfl:  .  B Complex Vitamins (VITAMIN B COMPLEX) TABS, Take by mouth., Disp: , Rfl:  .  cholecalciferol (VITAMIN D) 1000 units tablet, Take 1,000 Units by mouth daily., Disp: , Rfl:  .  lisinopril (PRINIVIL,ZESTRIL) 40 MG tablet, Take 1 tablet (40 mg total) by mouth daily., Disp: 30 tablet, Rfl: 5 .  lovastatin (MEVACOR) 40 MG tablet, TAKE ONE TABLET BY MOUTH AT BEDTIME, Disp: 30 tablet, Rfl: 3 .  temazepam (RESTORIL) 30 MG capsule, TAKE ONE CAPSULE BY  MOUTH AT BEDTIME AS NEEDED FOR SLEEP, Disp: 30 capsule, Rfl: 1   Review of Systems  Constitutional: Positive for appetite change. Negative for chills, diaphoresis, fatigue, fever and unexpected weight change.  HENT: Positive for dental problem. Negative for congestion, drooling, ear pain, facial swelling, hearing loss, mouth sores, sneezing, sore throat, trouble swallowing and voice change.   Eyes: Negative for pain, discharge, redness, itching and visual disturbance.  Respiratory: Negative for cough, choking, shortness of breath and wheezing.   Cardiovascular: Negative for chest pain, palpitations and leg swelling.  Gastrointestinal: Negative for abdominal pain, blood in stool, constipation, diarrhea and vomiting.  Endocrine: Negative for cold intolerance, heat intolerance and polydipsia.  Genitourinary: Negative for decreased urine volume, dysuria and hematuria.  Musculoskeletal: Negative for arthralgias, back pain and gait problem.  Skin: Negative for rash.  Allergic/Immunologic: Positive for environmental allergies.  Neurological: Negative for seizures, syncope, light-headedness and headaches.  Hematological: Negative for adenopathy.  Psychiatric/Behavioral: Positive for dysphoric mood. Negative for agitation and suicidal ideas. The patient is not nervous/anxious.     Per HPI unless specifically indicated above     Objective:    BP (!) 90/52 (BP Location: Left Arm, Patient Position: Sitting, Cuff Size:  Normal)   Pulse (!) 114   Temp 97.9 F (36.6 C)   Ht 6\' 1"  (1.854 m)   Wt 184 lb 12 oz (83.8 kg)   SpO2 98%   BMI 24.37 kg/m   Wt Readings from Last 3 Encounters:  02/29/16 184 lb 12 oz (83.8 kg)  02/28/16 186 lb (84.4 kg)  02/28/16 186 lb 3.2 oz (84.5 kg)    Physical Exam  Constitutional: He is oriented to person, place, and time. He appears well-developed and well-nourished.  HENT:  Head: Normocephalic and atraumatic.  Mouth/Throat: Oropharynx is clear and moist. No  oropharyngeal exudate.  Eyes: Conjunctivae and EOM are normal. Pupils are equal, round, and reactive to light.  Neck: Neck supple. No thyromegaly present.  Cardiovascular: Normal rate and regular rhythm.   Pulmonary/Chest: Effort normal and breath sounds normal. He has no wheezes. He has no rales.  Abdominal: Soft. Bowel sounds are normal. He exhibits no mass. There is no hepatosplenomegaly. There is no tenderness.  Musculoskeletal: He exhibits no edema.  Lymphadenopathy:    He has no cervical adenopathy.  Neurological: He is alert and oriented to person, place, and time.  Skin: Skin is warm and dry. No rash noted.  Psychiatric: He has a normal mood and affect. His behavior is normal. Thought content normal.  Vitals reviewed.   Results for orders placed or performed during the hospital encounter of 02/28/16  Comprehensive metabolic panel  Result Value Ref Range   Sodium 126 (L) 135 - 145 mmol/L   Potassium 3.1 (L) 3.5 - 5.1 mmol/L   Chloride 91 (L) 101 - 111 mmol/L   CO2 22 22 - 32 mmol/L   Glucose, Bld 104 (H) 65 - 99 mg/dL   BUN 18 6 - 20 mg/dL   Creatinine, Ser 2.17 (H) 0.61 - 1.24 mg/dL   Calcium 9.0 8.9 - 10.3 mg/dL   Total Protein 7.4 6.5 - 8.1 g/dL   Albumin 3.8 3.5 - 5.0 g/dL   AST 160 (H) 15 - 41 U/L   ALT 61 17 - 63 U/L   Alkaline Phosphatase 91 38 - 126 U/L   Total Bilirubin 0.9 0.3 - 1.2 mg/dL   GFR calc non Af Amer 33 (L) >60 mL/min   GFR calc Af Amer 38 (L) >60 mL/min   Anion gap 13 5 - 15  CBC with Differential/Platelet  Result Value Ref Range   WBC 7.3 4.0 - 10.5 K/uL   RBC 4.19 (L) 4.22 - 5.81 MIL/uL   Hemoglobin 14.6 13.0 - 17.0 g/dL   HCT 40.0 39.0 - 52.0 %   MCV 95.5 78.0 - 100.0 fL   MCH 34.8 (H) 26.0 - 34.0 pg   MCHC 36.5 (H) 30.0 - 36.0 g/dL   RDW 13.7 11.5 - 15.5 %   Platelets 244 150 - 400 K/uL   Neutrophils Relative % 55 %   Neutro Abs 4.0 1.7 - 7.7 K/uL   Lymphocytes Relative 29 %   Lymphs Abs 2.1 0.7 - 4.0 K/uL   Monocytes Relative 13 %    Monocytes Absolute 0.9 0.1 - 1.0 K/uL   Eosinophils Relative 2 %   Eosinophils Absolute 0.1 0.0 - 0.7 K/uL   Basophils Relative 1 %   Basophils Absolute 0.0 0.0 - 0.1 K/uL  I-stat troponin, ED  Result Value Ref Range   Troponin i, poc 0.00 0.00 - 0.08 ng/mL   Comment 3  Assessment & Plan:    Encounter Diagnoses  Name Primary?  . Essential hypertension Yes  . Gastroesophageal reflux disease, esophagitis presence not specified   . Depression, unspecified depression type   . Hypokalemia   . Hyperlipidemia, unspecified hyperlipidemia type   . Elevated liver enzymes   . Snuff user   . Screening for prostate cancer   . Screening for diabetes mellitus (DM)       -Pt counseled to pick up the pepcid rx by the ER yesterday for gerd.  It is $4 at [facility] -rx K to take 86meq for 3 days  -Push water/fluids.  Pt counseled to avoid etoh -Recheck K on Friday-  Also check chol,   Psa,  -rx prozac -encouraged pt to Go to [facility] for MH -follow up OV in 2 weeks.   RTO sooner prn

## 2016-03-01 ENCOUNTER — Telehealth: Payer: Self-pay

## 2016-03-01 NOTE — Telephone Encounter (Signed)
Called client to follow up with his new patient appointment at [facility]. Client states that it went well and he is to go for lab work and then for follow up. He states they did put him on some medication for his depression. Will follow as needed. Client has RN contact information.

## 2016-03-04 LAB — LIPID PANEL
Cholesterol: 179 mg/dL (ref <200)
HDL: 64 mg/dL (ref >40)
LDL CALC: 87 mg/dL (ref <100)
TRIGLYCERIDES: 140 mg/dL (ref <150)
Total CHOL/HDL Ratio: 2.8 Ratio (ref <5.0)
VLDL: 28 mg/dL (ref <30)

## 2016-03-04 LAB — COMPREHENSIVE METABOLIC PANEL
ALK PHOS: 68 U/L (ref 40–115)
ALT: 43 U/L (ref 9–46)
AST: 82 U/L — AB (ref 10–35)
Albumin: 3.7 g/dL (ref 3.6–5.1)
BUN: 9 mg/dL (ref 7–25)
CALCIUM: 9.4 mg/dL (ref 8.6–10.3)
CO2: 24 mmol/L (ref 20–31)
Chloride: 100 mmol/L (ref 98–110)
Creat: 1.23 mg/dL (ref 0.70–1.33)
GLUCOSE: 98 mg/dL (ref 65–99)
POTASSIUM: 4.1 mmol/L (ref 3.5–5.3)
Sodium: 135 mmol/L (ref 135–146)
Total Bilirubin: 1.4 mg/dL — ABNORMAL HIGH (ref 0.2–1.2)
Total Protein: 6.9 g/dL (ref 6.1–8.1)

## 2016-03-04 LAB — PSA: PSA: 0.2 ng/mL (ref <=4.0)

## 2016-03-08 ENCOUNTER — Encounter: Payer: Self-pay | Admitting: Gastroenterology

## 2016-03-15 ENCOUNTER — Ambulatory Visit: Payer: Self-pay | Admitting: Physician Assistant

## 2016-03-15 ENCOUNTER — Encounter: Payer: Self-pay | Admitting: Physician Assistant

## 2016-03-15 VITALS — BP 152/84 | HR 85 | Temp 97.5°F | Ht 73.0 in | Wt 190.8 lb

## 2016-03-15 DIAGNOSIS — F329 Major depressive disorder, single episode, unspecified: Secondary | ICD-10-CM

## 2016-03-15 DIAGNOSIS — R748 Abnormal levels of other serum enzymes: Secondary | ICD-10-CM

## 2016-03-15 DIAGNOSIS — G5793 Unspecified mononeuropathy of bilateral lower limbs: Secondary | ICD-10-CM

## 2016-03-15 DIAGNOSIS — F32A Depression, unspecified: Secondary | ICD-10-CM

## 2016-03-15 DIAGNOSIS — R7989 Other specified abnormal findings of blood chemistry: Secondary | ICD-10-CM

## 2016-03-15 DIAGNOSIS — I10 Essential (primary) hypertension: Secondary | ICD-10-CM

## 2016-03-15 DIAGNOSIS — K219 Gastro-esophageal reflux disease without esophagitis: Secondary | ICD-10-CM

## 2016-03-15 DIAGNOSIS — Z86010 Personal history of colonic polyps: Secondary | ICD-10-CM

## 2016-03-15 MED ORDER — GABAPENTIN 300 MG PO CAPS: 300.0000 mg | ORAL_CAPSULE | Freq: Three times a day (TID) | ORAL | 0 refills | Status: DC

## 2016-03-15 NOTE — Patient Instructions (Addendum)
   Turn in [facility] discount application  Get blood drawn for testosterone and potassium (before 10am)

## 2016-03-15 NOTE — Progress Notes (Signed)
BP (!) 152/84 (BP Location: Left Arm, Patient Position: Sitting, Cuff Size: Normal)   Pulse 85   Temp 97.5 F (36.4 C)   Ht 6\' 1"  (1.854 m)   Wt 190 lb 12 oz (86.5 kg)   SpO2 99%   BMI 25.17 kg/m    Subjective:    Patient ID: Tristan Cook, male    DOB: 1960-06-24, 56 y.o.   MRN: [id number]  HPI: Tristan Cook is a 56 y.o. male presenting on 03/15/2016 for Dizziness; Follow-up (on potassium); and Numbness   HPI   Pt did not go to [place] as recommended.   He is still stressed.  His brother died New Year's day.  He is not drinking.  He feels like the prozac is helping his mood  He isn't taking anything for his stomach but says it is not bothering him  He c/o foot pain.  Bilat.  Not the gout.  Feels numbness.  This has been going for about 2 months.  He had orthopedist in [place] (dr Blanch Media) give him shots in his feet last year for same thing.    Pt is due for repeat colonoscopy- had polyps.  Done in 2012 by Dr Oneida Alar   Pt on testosterone.  He has been off it for about 2 wk.  He has rx at home but hasn't filled it b/c it's expensive.  He says he has been taking a shot every 2 week.  His lost shot was 02/25/16.  This was managed by Dr Nicolette Bang the urologist  Relevant past medical, surgical, family and social history reviewed and updated as indicated. Interim medical history since our last visit reviewed. Allergies and medications reviewed and updated.   Current Outpatient Prescriptions:  .  aspirin 81 MG tablet, Take 1 tablet (81 mg total) by mouth daily., Disp: 30 tablet, Rfl:  .  B Complex Vitamins (VITAMIN B COMPLEX) TABS, Take by mouth., Disp: , Rfl:  .  cholecalciferol (VITAMIN D) 1000 units tablet, Take 1,000 Units by mouth daily., Disp: , Rfl:  .  FLUoxetine (PROZAC) 20 MG tablet, Take 1 tablet (20 mg total) by mouth daily., Disp: 30 tablet, Rfl: 0 .  lisinopril (PRINIVIL,ZESTRIL) 40 MG tablet, Take 1 tablet (40 mg total) by mouth daily., Disp: 30 tablet, Rfl: 5 .   temazepam (RESTORIL) 30 MG capsule, TAKE ONE CAPSULE BY MOUTH AT BEDTIME AS NEEDED FOR SLEEP, Disp: 30 capsule, Rfl: 1 .  allopurinol (ZYLOPRIM) 300 MG tablet, Take 1 tablet (300 mg total) by mouth daily. (Patient not taking: Reported on 03/15/2016), Disp: 30 tablet, Rfl: 6      Review of Systems  Constitutional: Negative for appetite change, chills, diaphoresis, fatigue, fever and unexpected weight change.  HENT: Positive for congestion. Negative for dental problem, drooling, ear pain, facial swelling, hearing loss, mouth sores, sneezing, sore throat, trouble swallowing and voice change.   Eyes: Negative for pain, discharge, redness, itching and visual disturbance.  Respiratory: Negative for cough, choking, shortness of breath and wheezing.   Cardiovascular: Negative for chest pain, palpitations and leg swelling.  Gastrointestinal: Negative for abdominal pain, blood in stool, constipation, diarrhea and vomiting.  Endocrine: Negative for cold intolerance, heat intolerance and polydipsia.  Genitourinary: Negative for decreased urine volume, dysuria and hematuria.  Musculoskeletal: Positive for gait problem. Negative for arthralgias and back pain.  Skin: Negative for rash.  Allergic/Immunologic: Positive for environmental allergies.  Neurological: Negative for seizures, syncope, light-headedness and headaches.  Hematological: Negative for adenopathy.  Psychiatric/Behavioral: Positive for dysphoric mood. Negative for agitation and suicidal ideas. The patient is not nervous/anxious.     Per HPI unless specifically indicated above     Objective:    BP (!) 152/84 (BP Location: Left Arm, Patient Position: Sitting, Cuff Size: Normal)   Pulse 85   Temp 97.5 F (36.4 C)   Ht 6\' 1"  (1.854 m)   Wt 190 lb 12 oz (86.5 kg)   SpO2 99%   BMI 25.17 kg/m   Wt Readings from Last 3 Encounters:  03/15/16 190 lb 12 oz (86.5 kg)  02/29/16 184 lb 12 oz (83.8 kg)  02/28/16 186 lb (84.4 kg)    Physical  Exam  Constitutional: He is oriented to person, place, and time. He appears well-developed and well-nourished.  HENT:  Head: Normocephalic and atraumatic.  Neck: Neck supple.  Cardiovascular: Normal rate and regular rhythm.   Pulmonary/Chest: Effort normal and breath sounds normal. He has no wheezes.  Abdominal: Soft. Bowel sounds are normal. There is no hepatosplenomegaly. There is no tenderness.  Musculoskeletal: He exhibits no edema.  B feet with mildly decreased sensation.  Nontender.  No swelling or deformity.  Strong pulses bilaterally.   Lymphadenopathy:    He has no cervical adenopathy.  Neurological: He is alert and oriented to person, place, and time.  Skin: Skin is warm and dry.  Psychiatric: He has a normal mood and affect. His behavior is normal.  Vitals reviewed.   Results for orders placed or performed in visit on 02/29/16  Comprehensive Metabolic Panel (CMET)  Result Value Ref Range   Sodium 135 135 - 146 mmol/L   Potassium 4.1 3.5 - 5.3 mmol/L   Chloride 100 98 - 110 mmol/L   CO2 24 20 - 31 mmol/L   Glucose, Bld 98 65 - 99 mg/dL   BUN 9 7 - 25 mg/dL   Creat 1.23 0.70 - 1.33 mg/dL   Total Bilirubin 1.4 (H) 0.2 - 1.2 mg/dL   Alkaline Phosphatase 68 40 - 115 U/L   AST 82 (H) 10 - 35 U/L   ALT 43 9 - 46 U/L   Total Protein 6.9 6.1 - 8.1 g/dL   Albumin 3.7 3.6 - 5.1 g/dL   Calcium 9.4 8.6 - 10.3 mg/dL  PSA  Result Value Ref Range   PSA 0.2 <=4.0 ng/mL  Lipid Profile  Result Value Ref Range   Cholesterol 179 <200 mg/dL   Triglycerides 140 <150 mg/dL   HDL 64 >40 mg/dL   Total CHOL/HDL Ratio 2.8 <5.0 Ratio   VLDL 28 <30 mg/dL   LDL Cholesterol 87 <100 mg/dL      Assessment & Plan:   Encounter Diagnoses  Name Primary?  . Essential hypertension Yes  . Gastroesophageal reflux disease, esophagitis presence not specified   . Depression, unspecified depression type   . Neuropathy of both feet   . Elevated liver enzymes   . Low serum testosterone   .  History of colonic polyps     -Reviewed labs with pt -rx gabapentin for neuropathy -will get pt signed up for medassist -will Check testosterone- pt was seeing dr Nicolette Bang but has not seen him in about 4 months.  He says he is past due but didn't go due to cost.  If T still low, will refer.  Pt is counseled to get his blood drawn in the morning, before 10am -No change in bp meds today due to BP low at last OV.  Will recheck  and monitor the BP -Gave [facility] discount application -will Refer to GI for repeat colonoscopy -pt doesn't want counseling- wants to stay on prozac.  He believes that is all he needs -recheck K+ when blood drawn for testosterone ch eck -follow up one month to recheck bp and neuropathy  (The duration of this appointment visit was 25 minutes of face-to-face time with the patient.  Greater than 50% of this time was spent in counseling, explanation of diagnosis, planning of further management, and coordination of care.)

## 2016-03-16 ENCOUNTER — Encounter: Payer: Self-pay | Admitting: Gastroenterology

## 2016-03-20 LAB — BASIC METABOLIC PANEL
BUN: 10 mg/dL (ref 7–25)
CO2: 28 mmol/L (ref 20–31)
Calcium: 9.3 mg/dL (ref 8.6–10.3)
Chloride: 104 mmol/L (ref 98–110)
Creat: 1.07 mg/dL (ref 0.70–1.33)
GLUCOSE: 91 mg/dL (ref 65–99)
POTASSIUM: 4.2 mmol/L (ref 3.5–5.3)
Sodium: 141 mmol/L (ref 135–146)

## 2016-03-21 LAB — TESTOSTERONE TOTAL,FREE,BIO, MALES
Albumin: 3.6 g/dL (ref 3.6–5.1)
Sex Hormone Binding: 38 nmol/L (ref 10–50)
TESTOSTERONE: 240 ng/dL — AB (ref 250–827)

## 2016-04-04 ENCOUNTER — Ambulatory Visit: Payer: Self-pay | Admitting: Gastroenterology

## 2016-04-17 ENCOUNTER — Encounter: Payer: Self-pay | Admitting: Physician Assistant

## 2016-04-17 ENCOUNTER — Ambulatory Visit: Payer: Self-pay | Admitting: Physician Assistant

## 2016-04-17 VITALS — BP 136/90 | HR 90 | Temp 98.1°F | Ht 73.0 in | Wt 180.0 lb

## 2016-04-17 DIAGNOSIS — F329 Major depressive disorder, single episode, unspecified: Secondary | ICD-10-CM

## 2016-04-17 DIAGNOSIS — I10 Essential (primary) hypertension: Secondary | ICD-10-CM

## 2016-04-17 DIAGNOSIS — F32A Depression, unspecified: Secondary | ICD-10-CM

## 2016-04-17 DIAGNOSIS — R7989 Other specified abnormal findings of blood chemistry: Secondary | ICD-10-CM

## 2016-04-17 DIAGNOSIS — Z91199 Patient's noncompliance with other medical treatment and regimen due to unspecified reason: Secondary | ICD-10-CM

## 2016-04-17 DIAGNOSIS — G5793 Unspecified mononeuropathy of bilateral lower limbs: Secondary | ICD-10-CM

## 2016-04-17 DIAGNOSIS — Z9119 Patient's noncompliance with other medical treatment and regimen: Secondary | ICD-10-CM

## 2016-04-17 MED ORDER — GABAPENTIN 300 MG PO CAPS: 300.0000 mg | ORAL_CAPSULE | Freq: Three times a day (TID) | ORAL | 0 refills | Status: DC

## 2016-04-17 NOTE — Progress Notes (Signed)
BP 136/90 (BP Location: Left Arm, Patient Position: Sitting, Cuff Size: Normal)   Pulse 90   Temp 98.1 F (36.7 C)   Ht 6\' 1"  (1.854 m)   Wt 180 lb (81.6 kg)   SpO2 98%   BMI 23.75 kg/m    Subjective:    Patient ID: Tristan Cook, male    DOB: Mar 13, 1961, 56 y.o.   MRN: [id number]  HPI: Tristan Cook is a 56 y.o. male presenting on 04/17/2016 for Hypertension (pt ran out of BP med 2 weeks ago.) and Peripheral Neuropathy   HPI   -[facility] -He has been out of meds 2 wk -Pt didn't turn in his papers for [facility]. He is still not brought in tax statements. -Pt has not turned in his [employer] discount application either. Says he was waiting to get his tax statements.  -Pt cancelled GI appt b/c he didn't have his [facility] discount -Pt concerned about his balance.  He thinks it is related to his neuroapthy.  Says it hurts to have his shoes on    Relevant past medical, surgical, family and social history reviewed and updated as indicated. Interim medical history since our last visit reviewed. Allergies and medications reviewed and updated.   Current Outpatient Prescriptions:  .  aspirin 81 MG tablet, Take 1 tablet (81 mg total) by mouth daily., Disp: 30 tablet, Rfl:  .  B Complex Vitamins (VITAMIN B COMPLEX) TABS, Take by mouth., Disp: , Rfl:  .  cholecalciferol (VITAMIN D) 1000 units tablet, Take 1,000 Units by mouth daily., Disp: , Rfl:  .  FLUoxetine (PROZAC) 20 MG tablet, Take 1 tablet (20 mg total) by mouth daily., Disp: 30 tablet, Rfl: 0 .  allopurinol (ZYLOPRIM) 300 MG tablet, Take 1 tablet (300 mg total) by mouth daily. (Patient not taking: Reported on 03/15/2016), Disp: 30 tablet, Rfl: 6 .  gabapentin (NEURONTIN) 300 MG capsule, Take 1 capsule (300 mg total) by mouth 3 (three) times daily. Take 1 po qd x 7 days then increase to 1 po bid (Patient not taking: Reported on 04/17/2016), Disp: 180 capsule, Rfl: 0 .  lisinopril (PRINIVIL,ZESTRIL) 40 MG tablet, Take 1 tablet  (40 mg total) by mouth daily. (Patient not taking: Reported on 04/17/2016), Disp: 30 tablet, Rfl: 5 .  temazepam (RESTORIL) 30 MG capsule, TAKE ONE CAPSULE BY MOUTH AT BEDTIME AS NEEDED FOR SLEEP (Patient not taking: Reported on 04/17/2016), Disp: 30 capsule, Rfl: 1   Review of Systems  Constitutional: Negative for appetite change, chills, diaphoresis, fatigue, fever and unexpected weight change.  HENT: Positive for dental problem. Negative for congestion, drooling, ear pain, facial swelling, hearing loss, mouth sores, sneezing, sore throat, trouble swallowing and voice change.   Eyes: Negative for pain, discharge, redness, itching and visual disturbance.  Respiratory: Negative for cough, choking, shortness of breath and wheezing.   Cardiovascular: Negative for chest pain, palpitations and leg swelling.  Gastrointestinal: Negative for abdominal pain, blood in stool, constipation, diarrhea and vomiting.  Endocrine: Negative for cold intolerance, heat intolerance and polydipsia.  Genitourinary: Negative for decreased urine volume, dysuria and hematuria.  Musculoskeletal: Positive for gait problem. Negative for arthralgias and back pain.  Skin: Negative for rash.  Allergic/Immunologic: Negative for environmental allergies.  Neurological: Negative for seizures, syncope, light-headedness and headaches.  Hematological: Negative for adenopathy.  Psychiatric/Behavioral: Positive for dysphoric mood. Negative for agitation and suicidal ideas. The patient is not nervous/anxious.     Per HPI unless specifically indicated  above     Objective:    BP 136/90 (BP Location: Left Arm, Patient Position: Sitting, Cuff Size: Normal)   Pulse 90   Temp 98.1 F (36.7 C)   Ht 6\' 1"  (1.854 m)   Wt 180 lb (81.6 kg)   SpO2 98%   BMI 23.75 kg/m   Wt Readings from Last 3 Encounters:  04/17/16 180 lb (81.6 kg)  03/15/16 190 lb 12 oz (86.5 kg)  02/29/16 184 lb 12 oz (83.8 kg)    Physical Exam  Constitutional: He  is oriented to person, place, and time. He appears well-developed and well-nourished.  HENT:  Head: Normocephalic and atraumatic.  Neck: Neck supple.  Cardiovascular: Normal rate and regular rhythm.   Pulmonary/Chest: Effort normal and breath sounds normal. He has no wheezes.  Abdominal: Soft. Bowel sounds are normal. There is no hepatosplenomegaly. There is no tenderness.  Musculoskeletal: He exhibits no edema.       Right foot: Normal.       Left foot: Normal.  Lymphadenopathy:    He has no cervical adenopathy.  Neurological: He is alert and oriented to person, place, and time.  Skin: Skin is warm and dry.  Psychiatric: He has a normal mood and affect. His behavior is normal.  Vitals reviewed.   Results for orders placed or performed in visit on 03/15/16  Testosterone Total,Free,Bio, Males  Result Value Ref Range   Testosterone 240 (L) 250 - 827 ng/dL   Albumin 3.6 3.6 - 5.1 g/dL   Sex Hormone Binding 38 10 - 50 nmol/L   Testosterone, Free See below 46.0 - 224.0 pg/mL   Testosterone, Bioavailable SEE NOTE 110.0 - 575.0 ng/dL  Basic Metabolic Panel (BMET)  Result Value Ref Range   Sodium 141 135 - 146 mmol/L   Potassium 4.2 3.5 - 5.3 mmol/L   Chloride 104 98 - 110 mmol/L   CO2 28 20 - 31 mmol/L   Glucose, Bld 91 65 - 99 mg/dL   BUN 10 7 - 25 mg/dL   Creat 1.07 0.70 - 1.33 mg/dL   Calcium 9.3 8.6 - 10.3 mg/dL      Assessment & Plan:   Encounter Diagnoses  Name Primary?  . Essential hypertension Yes  . Neuropathy of both feet   . Personal history of noncompliance with medical treatment, presenting hazards to health   . Low serum testosterone   . 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  Discussed that his gabapentin was written to help his neuropathy and it can't  help if he doesn't take it -urged pt to contact [facility] for depression/counseling -gave pt [facility] [place] card to contact to try to get some help with transportation -follow up one month to make sure pt is back on his meds and has been to [facility] and urology.  RTO sooner prn

## 2016-04-18 ENCOUNTER — Telehealth: Payer: Self-pay

## 2016-04-18 NOTE — Telephone Encounter (Signed)
Client called requesting help to get transportation to [facility] services for an intake to receive mental health treatment. [facility] called and arranged transportation for 04/24/16 at 0900 and pickup for 0800. Client aware. Client also states he has been unable to pay for his gabapentin. Arranged client to meet RN at the [facility] in [place] on 04/20/16 to discuss his barriers and needs. Client aware that RN will be there at 9:00am . Client states he will try to come there to see RN.

## 2016-04-20 NOTE — Congregational Nurse Program (Signed)
Congregational Nurse Program Note  Date of Encounter: 04/20/2016  Past Medical History: Past Medical History:  Diagnosis Date  . Abdominal pain   . Adenomatous colon polyp 2008  . Arthritis   . Back pain   . Constipation   . Depression   . GERD (gastroesophageal reflux disease)   . Gout   . Hemorrhoids   . High cholesterol   . Hypertension   . Neck pain   . Substance abuse    h/o excessive alcohol use; pt says he limits use to one to 2 beers daily he currently    Encounter Details:     CNP Questionnaire - 04/20/16 1406      Patient Demographics   Is this a new or existing patient? Existing   Patient is considered a/an Not Applicable   Race African-American/Black     Patient Assistance   Location of Patient Assistance [employer], [place]   Patient's financial/insurance status Low Income;Self-Pay (Uninsured)   Uninsured Patient ([place]) Yes   Patient referred to apply for the following financial assistance [place] insecurities addressed Not Applicable   Transportation assistance No   Assistance securing medications Yes   Type of Assistance [employer] Navigating the healthcare system;Medications     Encounter Details   Primary purpose of visit Chronic Illness/Condition Visit;Education/Health Concerns;Navigating the Healthcare System;Post PCP Visit   Was an Emergency Department visit averted? Yes   Does patient have a medical provider? Yes   Patient referred to [place]   Was a mental health screening completed? (GAINS tool) No   Does patient have dental issues? No   Does patient have vision issues? No   Does your patient have an abnormal blood pressure today? No   Since previous encounter, have you referred patient for abnormal blood pressure that resulted in a new diagnosis or medication change? No   Does your patient have an abnormal blood glucose today? No   Since previous  encounter, have you referred patient for abnormal blood glucose that resulted in a new diagnosis or medication change? No   Was there a life-saving intervention made? No     client seen today at the [facility] needing help obtaining his gabapentin that has been prescribed. [facility] will assist with a one time prescription of gabapentin 300 mg as written by client's PCP provider. RN encouraged client to follow through with turning in his information needed by the free clinic to help him apply for [facility] as well as the [facility] application. Client states he is trying to obtain his past tax forms. RN also encouraged client to follow through with going to [facility] for mental health services and transportation arranged through [place] with [facility]. Referral form for [employer] completed and sent with Client.  Will follow up as needed.

## 2016-04-27 NOTE — Congregational Nurse Program (Signed)
Congregational Nurse Program Note  Date of Encounter: 04/27/2016  Past Medical History: Past Medical History:  Diagnosis Date  . Abdominal pain   . Adenomatous colon polyp 2008  . Arthritis   . Back pain   . Constipation   . Depression   . GERD (gastroesophageal reflux disease)   . Gout   . Hemorrhoids   . High cholesterol   . Hypertension   . Neck pain   . Substance abuse    h/o excessive alcohol use; pt says he limits use to one to 2 beers daily he currently    Encounter Details:     CNP Questionnaire - 04/27/16 1645      Patient Demographics   Is this a new or existing patient? Existing   Patient is considered a/an Not Applicable   Race African-American/Black     Patient Assistance   Location of Patient Assistance [employer], [place]   Patient's financial/insurance status Low Income;Self-Pay (Uninsured)   Uninsured Patient ([place] [facility]/[employer]) Yes   Interventions Follow-up/Education/Support provided after completed appt.   Patient referred to apply for the following financial assistance [facility] insecurities addressed Not Applicable   Transportation assistance No   Assistance securing medications Yes   Type of [place];Other   Educational health offerings Navigating the healthcare system;Medications     Encounter Details   Primary purpose of visit Chronic Illness/Condition Visit;Education/Health Concerns;Navigating the Healthcare System;Post PCP Visit   Was an Emergency Department visit averted? Not Applicable   Does patient have a medical provider? Yes   Patient referred to [place]   Was a mental health screening completed? (GAINS tool) No   Does patient have dental issues? No   Does patient have vision issues? No   Does your patient have an abnormal blood pressure today? No   Since previous encounter, have you referred patient for abnormal blood pressure that resulted in a new diagnosis or medication  change? No   Does your patient have an abnormal blood glucose today? No   Since previous encounter, have you referred patient for abnormal blood glucose that resulted in a new diagnosis or medication change? No   Was there a life-saving intervention made? No      Client came by the [facility] today in [place] to inquire about help finding and discount coupon for his testoterone medication. He states he has refills on his prescription, but cannot afford it now that he is not working. RN checked and printed a coupon from drugs.com and Good[employer] to [facility] for client.  Client states he is going to his mental health appointment at [facility] and transportation has already been arranged. Client encouraged to keep all his medical appointments as well as those with his mental health provider. Will follow as needed.

## 2016-05-05 ENCOUNTER — Telehealth: Payer: Self-pay

## 2016-05-05 NOTE — Telephone Encounter (Signed)
Client called needing transportation for an upcoming [place] appointment on 05/10/16 at 1:45 pm . [facility] called and transportation arranged and Client aware of pickup time for that appointment of 12:45 pm. Will follow as needed.

## 2016-05-15 ENCOUNTER — Encounter: Payer: Self-pay | Admitting: Physician Assistant

## 2016-05-15 ENCOUNTER — Ambulatory Visit: Payer: Self-pay | Admitting: Physician Assistant

## 2016-05-15 DIAGNOSIS — I10 Essential (primary) hypertension: Secondary | ICD-10-CM

## 2016-05-15 MED ORDER — LISINOPRIL 20 MG PO TABS: 40.0000 mg | ORAL_TABLET | Freq: Every day | ORAL | 1 refills | Status: DC

## 2016-05-15 MED ORDER — LISINOPRIL 40 MG PO TABS: 40.0000 mg | ORAL_TABLET | Freq: Every day | ORAL | 1 refills | Status: DC

## 2016-05-15 NOTE — Progress Notes (Signed)
BP (!) 154/82 (BP Location: Left Arm, Patient Position: Sitting, Cuff Size: Normal)   Pulse 97   Temp 98.1 F (36.7 C)   Ht 6\' 1"  (1.854 m)   Wt 188 lb (85.3 kg)   SpO2 99%   BMI 24.80 kg/m    Subjective:    Patient ID: Tristan Cook, male    DOB: 1960/10/31, 56 y.o.   MRN: [id number]  HPI: Tristan Cook is a 56 y.o. male presenting on 05/15/2016 for Hypertension   HPI   Pt says he got approved for [facility].  He is supposed to be getting his gabapentin.  He wants to get his lisinopril from there.  He has not taken his lisinopril since his last appointment here at [facility].   He has been to [facility]. He has appointment to follow up later this month.  He says he turned in his cone discount application but has to take a letter to turn in to [place]  He has GI appointment on 05/25/16  Relevant past medical, surgical, family and social history reviewed and updated as indicated. Interim medical history since our last visit reviewed. Allergies and medications reviewed and updated.   Current Outpatient Prescriptions:  .  aspirin 81 MG tablet, Take 1 tablet (81 mg total) by mouth daily., Disp: 30 tablet, Rfl:  .  B Complex Vitamins (VITAMIN B COMPLEX) TABS, Take by mouth., Disp: , Rfl:  .  cholecalciferol (VITAMIN D) 1000 units tablet, Take 1,000 Units by mouth daily., Disp: , Rfl:  .  FLUoxetine (PROZAC) 20 MG tablet, Take 1 tablet (20 mg total) by mouth daily., Disp: 30 tablet, Rfl: 0 .  gabapentin (NEURONTIN) 300 MG capsule, Take 1 capsule (300 mg total) by mouth 3 (three) times daily. Take 1 po qd x 7 days then increase to 1 po bid (Patient taking differently: Take 300 mg by mouth 3 (three) times daily. ), Disp: 60 capsule, Rfl: 0 .  allopurinol (ZYLOPRIM) 300 MG tablet, Take 1 tablet (300 mg total) by mouth daily. (Patient not taking: Reported on 03/15/2016), Disp: 30 tablet, Rfl: 6 .  lisinopril (PRINIVIL,ZESTRIL) 40 MG tablet, Take 1 tablet (40 mg total) by mouth daily. (Patient not  taking: Reported on 05/15/2016), Disp: 30 tablet, Rfl: 5 .  temazepam (RESTORIL) 30 MG capsule, TAKE ONE CAPSULE BY MOUTH AT BEDTIME AS NEEDED FOR SLEEP (Patient not taking: Reported on 04/17/2016), Disp: 30 capsule, Rfl: 1   Review of Systems  Constitutional: Negative for appetite change, chills, diaphoresis, fatigue, fever and unexpected weight change.  HENT: Negative for congestion, dental problem, drooling, ear pain, facial swelling, hearing loss, mouth sores, sneezing, sore throat, trouble swallowing and voice change.   Eyes: Negative for pain, discharge, redness, itching and visual disturbance.  Respiratory: Negative for cough, choking, shortness of breath and wheezing.   Cardiovascular: Negative for chest pain, palpitations and leg swelling.  Gastrointestinal: Negative for abdominal pain, blood in stool, constipation, diarrhea and vomiting.  Endocrine: Negative for cold intolerance, heat intolerance and polydipsia.  Genitourinary: Negative for decreased urine volume, dysuria and hematuria.  Musculoskeletal: Positive for arthralgias and gait problem. Negative for back pain.  Skin: Negative for rash.  Allergic/Immunologic: Negative for environmental allergies.  Neurological: Negative for seizures, syncope, light-headedness and headaches.  Hematological: Negative for adenopathy.  Psychiatric/Behavioral: Positive for dysphoric mood. Negative for agitation and suicidal ideas. The patient is not nervous/anxious.     Per HPI unless specifically indicated above     Objective:  BP (!) 154/82 (BP Location: Left Arm, Patient Position: Sitting, Cuff Size: Normal)   Pulse 97   Temp 98.1 F (36.7 C)   Ht 6\' 1"  (1.854 m)   Wt 188 lb (85.3 kg)   SpO2 99%   BMI 24.80 kg/m   Wt Readings from Last 3 Encounters:  05/15/16 188 lb (85.3 kg)  04/17/16 180 lb (81.6 kg)  03/15/16 190 lb 12 oz (86.5 kg)    Physical Exam  Constitutional: He is oriented to person, place, and time. He appears  well-developed and well-nourished.  HENT:  Head: Normocephalic and atraumatic.  Neck: Neck supple.  Cardiovascular: Normal rate and regular rhythm.   Pulmonary/Chest: Effort normal and breath sounds normal. He has no wheezes.  Abdominal: Soft. Bowel sounds are normal. There is no hepatosplenomegaly. There is no tenderness.  Musculoskeletal: He exhibits no edema.  Lymphadenopathy:    He has no cervical adenopathy.  Neurological: He is alert and oriented to person, place, and time.  Skin: Skin is warm and dry.  Psychiatric: He has a normal mood and affect. His behavior is normal.  Vitals reviewed.       Assessment & Plan:   Encounter Diagnosis  Name Primary?  . Essential hypertension     -recommended to pt that he not wait the 2 weeks that it will take to get his lisinopril from [facility].  He agrees and rx sent to [place] -pt to continue with [facility] for MH issues -follow up 6 weeks to recheck bp

## 2016-05-25 ENCOUNTER — Ambulatory Visit: Payer: Self-pay | Admitting: Nurse Practitioner

## 2016-05-29 ENCOUNTER — Telehealth: Payer: Self-pay

## 2016-05-29 ENCOUNTER — Telehealth: Payer: Self-pay | Admitting: Gastroenterology

## 2016-05-29 NOTE — Telephone Encounter (Signed)
Pt said he is not having any problems with hemorrhoids at this time. But his rectum feels numb on the left side x 2 months. Said he was diagnosed with neuropathy in his feet x 2 months ago and he wonders if this can be related. He said he has normal BM's daily.  He was started on Gabapentin at [facility] after he was seen at the ED.  Routing to Roseanne Kaufman, NP who saw him in the office last. Please advise!

## 2016-05-29 NOTE — Telephone Encounter (Signed)
[phone]  Please call patient, rectum is numb and he does not know why

## 2016-05-29 NOTE — Telephone Encounter (Signed)
Not sure this is related. He is overdue for colonoscopy, however. We can have him come into clinic if he is able.

## 2016-05-29 NOTE — Telephone Encounter (Signed)
Client called RN inquiring about a medication he needs to be administered Intramuscularly. Advised client to contact his medical provider for direction. Client states he will call them.

## 2016-05-30 NOTE — Telephone Encounter (Signed)
PT is aware and already has appt on 06/14/2016 and will keep that appt.

## 2016-06-14 ENCOUNTER — Encounter: Payer: Self-pay | Admitting: Nurse Practitioner

## 2016-06-14 ENCOUNTER — Ambulatory Visit (INDEPENDENT_AMBULATORY_CARE_PROVIDER_SITE_OTHER): Payer: Self-pay | Admitting: Nurse Practitioner

## 2016-06-14 ENCOUNTER — Other Ambulatory Visit: Payer: Self-pay

## 2016-06-14 VITALS — BP 159/97 | HR 83 | Temp 98.4°F | Ht 74.0 in | Wt 182.4 lb

## 2016-06-14 DIAGNOSIS — R030 Elevated blood-pressure reading, without diagnosis of hypertension: Secondary | ICD-10-CM

## 2016-06-14 DIAGNOSIS — K648 Other hemorrhoids: Secondary | ICD-10-CM

## 2016-06-14 DIAGNOSIS — Z86010 Personal history of colonic polyps: Secondary | ICD-10-CM

## 2016-06-14 DIAGNOSIS — K649 Unspecified hemorrhoids: Secondary | ICD-10-CM

## 2016-06-14 NOTE — Assessment & Plan Note (Signed)
History of multiple adenomatous colon polyps removed, primarily in the rectum. Recommended 5 year repeat colonoscopy and was due in December 2017. At this point we will proceed with colonoscopy as previously recommended. Return for follow-up based on postprocedure recommendations.  Proceed with colonoscopy with Dr. Oneida Alar in the near future. The risks, benefits, and alternatives have been discussed in detail with the patient. They state understanding and desire to proceed.   The patient is currently on Prozac 20 mg daily, Neurontin 300 mg 3 times a day as needed, and Restoril at bedtime as needed. Previous colonoscopy completed under conscious sedation. No other anticoagulants, anxiolytics, chronic pain medications, or antidepressants. I will add 12.5 mg preprocedure Phenergan due to polypharmacy to promote adequate sedation.

## 2016-06-14 NOTE — Progress Notes (Signed)
Referring Provider: Soyla Dryer, PA-C Primary Care Physician:  Soyla Dryer, PA-C Primary GI:  Dr. Oneida Alar  Chief Complaint  Patient presents with  . other    Numbness in rectum    HPI:   Tristan Cook is a 56 y.o. male who presents for rectal numbness and to schedule overdue colonoscopy. The patient was last seen in our office 09/20/2015 for hemorrhoids. At that time it was noted he has a history of GERD and peptic stricture with dysphagia. At his last appointment it was noted he lost about 8 pounds recently, was having diarrhea and abdominal discomfort an over-the-counter antidiarrheal pill helped. Abdominal pain improved but still tender. Saw bright red blood in a while ago and felt like it was due to his hemorrhoids. He uses Preparation H. Feels burning and feels like there is a scar in his rectum. He underwent hemorrhoid banding with flexible sigmoidoscopy on 01/02/2012 with successful placement of 3 bands for large internal hemorrhoids.  Last colonoscopy completed 02/28/2011 which found multiple rectal polyps and internal hemorrhoids. Recommended colonoscopy in 5 years due to advanced polyp.  Today he states he's having neuropathy pain. Otherwise doing ok. Recently diagnosed with peripheral neuropathy, not diabetic. Is on Neurontin for this but not very effective for his pain. Denies abdominal pain. Occasional N/V if he misses his OTC heartburn medicine; isn't sure what he medicine name is. Denies hematochezia or melena. Denies fever, chills, unintentional weight loss, acute changes in bowel habits. Developed partial rectal numbness when he was diagnosed with neuropathy; denies incontinence or inability to sense need for defecation. Denies chest pain, dyspnea, dizziness, syncope, near syncope. Denies any other upper or lower GI symptoms.  Past Medical History:  Diagnosis Date  . Abdominal pain   . Adenomatous colon polyp 2008  . Arthritis   . Back pain   . Constipation     . Depression   . GERD (gastroesophageal reflux disease)   . Gout   . Hemorrhoids   . High cholesterol   . Hypertension   . Neck pain   . Substance abuse    h/o excessive alcohol use; pt says he limits use to one to 2 beers daily he currently    Past Surgical History:  Procedure Laterality Date  . COLONOSCOPY  10/09/2006   3 mm pedunculated sigmoid colon polyp removed/8-mm sessile hepatic flexure polyp (tubular villous adenoma) removed/6-mm  descending colon polyp removed/small internal hemorrhoids  . COLONOSCOPY  02/28/2011   UTM:[id number], multiple in the rectum/Internal hemorrhoids, MODERATE-CAUSING RECTAL BLEEDING  . 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 Mild erosive gastrtitis most likely due to indomethacin   . FINGER SURGERY Right    4th and 5th digit  . FLEXIBLE SIGMOIDOSCOPY  01/02/2012   SLF: 1. the colonic mucosa appeared normal in the sigmoid colon 2. Large internal hemorrhoids: cause for rectal bleeding/pain . s/p banding X 3   . HAND SURGERY    . SAVORY DILATION N/A 07/21/2014   Procedure: SAVORY DILATION;  Surgeon: [name], MD;  Location: [facility];  Service: Endoscopy;  Laterality: N/A;    Current Outpatient Prescriptions  Medication Sig Dispense Refill  . aspirin 81 MG tablet Take 1 tablet (81 mg total) by mouth daily. 30 tablet   . B Complex Vitamins (VITAMIN B COMPLEX) TABS Take by mouth.    . cholecalciferol (VITAMIN D) 1000 units tablet Take 1,000 Units by mouth daily.    [name] FLUoxetine (  PROZAC) 20 MG tablet Take 1 tablet (20 mg total) by mouth daily. 30 tablet 0  . gabapentin (NEURONTIN) 300 MG capsule Take 1 capsule (300 mg total) by mouth 3 (three) times daily. Take 1 po qd x 7 days then increase to 1 po bid (Patient taking differently: Take 300 mg by mouth 3 (three) times daily. ) 60 capsule 0  . lisinopril (PRINIVIL,ZESTRIL) 40 MG tablet Take 1 tablet (40 mg total) by mouth daily. 90  tablet 1  . temazepam (RESTORIL) 30 MG capsule TAKE ONE CAPSULE BY MOUTH AT BEDTIME AS NEEDED FOR SLEEP 30 capsule 1   No current facility-administered medications for this visit.     Allergies as of 06/14/2016  . (No Known Allergies)    Family History  Problem Relation Age of Onset  . Hypertension Mother   . Hypertension Father   . Stroke Father   . Hypertension Sister   . Hypertension Brother   . Cancer Brother   . Cancer Brother     throat cancer  . Hypertension Brother   . Stroke Brother     2x  . Colon cancer Neg Hx     Social History   Social History  . Marital status: Legally Separated    Spouse name: N/A  . Number of children: 0  . Years of education: N/A   Occupational History  . unemployed, Arts development officer Unemployed   Social History Main Topics  . Smoking status: Former Smoker    Packs/day: 1.50    Years: 33.00    Types: Cigarettes    Quit date: 03/13/2004  . Smokeless tobacco: Current User    Types: Snuff     Comment: Dips every once in a while  . Alcohol use 3.6 oz/week    6 Cans of beer per week     Comment: drinks 6 pack of beer on weekends  . Drug use: No  . Sexual activity: Not Asked   Other Topics Concern  . None   Social History Narrative  . None    Review of Systems: General: Negative for anorexia, weight loss, fever, chills, fatigue, weakness. Eyes: Negative for vision changes.  ENT: Negative for hoarseness, difficulty swallowing , nasal congestion. CV: Negative for chest pain, angina, palpitations, dyspnea on exertion, peripheral edema.  Respiratory: Negative for dyspnea at rest, dyspnea on exertion, cough, sputum, wheezing.  GI: See history of present illness. GU:  Negative for dysuria, hematuria, urinary incontinence, urinary frequency, nocturnal urination.  MS: Negative for joint pain, low back pain.  Derm: Negative for rash or itching.  Neuro: Negative for weakness, abnormal sensation, seizure, frequent headaches, memory loss,  confusion.  Psych: Negative for anxiety, depression, suicidal ideation, hallucinations.  Endo: Negative for unusual weight change.  Heme: Negative for bruising or bleeding. Allergy: Negative for rash or hives.   Physical Exam: BP (!) 159/97   Pulse 83   Temp 98.4 F (36.9 C) (Oral)   Ht 6\' 2"  (1.88 m)   Wt 182 lb 6.4 oz (82.7 kg)   BMI 23.42 kg/m  General:   Alert and oriented. Pleasant and cooperative. Well-nourished and well-developed.  Head:  Normocephalic and atraumatic. Eyes:  Without icterus, sclera clear and conjunctiva pink.  Ears:  Normal auditory acuity. Mouth:  No deformity or lesions, oral mucosa pink.  Throat/Neck:  Supple, without mass or thyromegaly. Cardiovascular:  S1, S2 present without murmurs appreciated. Normal pulses noted. Extremities without clubbing or edema. Respiratory:  Clear to auscultation bilaterally. No wheezes,  rales, or rhonchi. No distress.  Gastrointestinal:  +BS, soft, non-tender and non-distended. No HSM noted. No guarding or rebound. No masses appreciated.  Rectal:  Deferred  Musculoskalatal:  Symmetrical without gross deformities. Normal posture. Skin:  Intact without significant lesions or rashes. Neurologic:  Alert and oriented x4;  grossly normal neurologically. Psych:  Alert and cooperative. Normal mood and affect. Heme/Lymph/Immune: No significant cervical adenopathy. No excessive bruising noted.    06/14/2016 3:00 PM   Disclaimer: This note was dictated with voice recognition software. Similar sounding words can inadvertently be transcribed and may not be corrected upon review.

## 2016-06-14 NOTE — Patient Instructions (Signed)
1. We will schedule your procedure for you 2. Further recommendations to follow 3. Return for follow-up based on post-procedure recommendations.

## 2016-06-14 NOTE — Progress Notes (Signed)
CC'ED TO PCP 

## 2016-06-14 NOTE — Assessment & Plan Note (Signed)
Blood pressure was elevated today on multiple readings. States he thinks is because he is in significant pain with his neuropathy. He is not symptomatic. Specifically, denies dizziness, lightheadedness, chest pain, dyspnea. I have advised him to call his primary care today and notify them as he may need medication changes. He agrees.

## 2016-06-14 NOTE — Assessment & Plan Note (Signed)
History of internal hemorrhoids status post banding during flexible sigmoidoscopy. No recurrent hemorrhoid symptoms or rectal bleeding noted. As an aside, the patient is complaining of partial rectal numbness. States this started when he was diagnosed with neuropathy. Not sure if rectal numbness is related to his neuropathy diagnosis. This seems to be more bothersome than acute. No urgency, still able to sense need for defecation. Continue to monitor. He is due for colonoscopy regardless, as per below. Return for follow-up based on postprocedure recommendations.

## 2016-06-19 ENCOUNTER — Telehealth: Payer: Self-pay

## 2016-06-19 NOTE — Telephone Encounter (Signed)
c/o the gabapentin is not helping the neuropathy in his feet and legs and the pain is unbearable at times and [facility] than gab[facility]entin. Advised he would need an appointment and their were none this week or next. Still wants to know if there is anything you can do to help him. Please advise

## 2016-06-19 NOTE — Telephone Encounter (Signed)
No and since it is foot pain , best that he see the podiatrist to treat that. I do not intend to prescribe anything other than the gabapentin for chronic pain and that is not working for him

## 2016-06-26 NOTE — Progress Notes (Signed)
REVIEWED-NO ADDITIONAL RECOMMENDATIONS. 

## 2016-06-27 ENCOUNTER — Ambulatory Visit: Payer: Self-pay | Admitting: Physician Assistant

## 2016-06-27 ENCOUNTER — Encounter: Payer: Self-pay | Admitting: Physician Assistant

## 2016-06-27 VITALS — BP 140/88 | HR 98 | Temp 97.7°F | Ht 73.0 in | Wt 184.8 lb

## 2016-06-27 DIAGNOSIS — G5793 Unspecified mononeuropathy of bilateral lower limbs: Secondary | ICD-10-CM

## 2016-06-27 DIAGNOSIS — K629 Disease of anus and rectum, unspecified: Secondary | ICD-10-CM

## 2016-06-27 DIAGNOSIS — R292 Abnormal reflex: Secondary | ICD-10-CM

## 2016-06-27 DIAGNOSIS — R32 Unspecified urinary incontinence: Secondary | ICD-10-CM

## 2016-06-27 DIAGNOSIS — R198 Other specified symptoms and signs involving the digestive system and abdomen: Secondary | ICD-10-CM

## 2016-06-27 DIAGNOSIS — I10 Essential (primary) hypertension: Secondary | ICD-10-CM

## 2016-06-27 DIAGNOSIS — R200 Anesthesia of skin: Secondary | ICD-10-CM

## 2016-06-27 NOTE — Progress Notes (Signed)
BP 140/88 (BP Location: Left Arm, Patient Position: Sitting, Cuff Size: Normal)   Pulse 98   Temp 97.7 F (36.5 C)   Ht 6\' 1"  (1.854 m)   Wt 184 lb 12 oz (83.8 kg)   SpO2 99%   BMI 24.37 kg/m    Subjective:    Patient ID: Tristan Cook, male    DOB: [birth year], 56 y.o.   MRN: [id number]  HPI: Tristan Cook is a 56 y.o. male presenting on 06/27/2016 for Hypertension   HPI   Pt [facility]t [facility] April 22 Pt is scheduled for colonoscoipy next week  Pt states he checks his bp at home. He says it was low- 131/79.  He says he felt dizzy.  He thinks he felt dizzy though because he was taking benedryl for his runny nose.   He says it usually runs in the low 140s and sometimes upper 130s.    Pt states numbness bilaterally from the knees down.  He says this came on gradually a few weeks ago.   He says his back is not hurting.  He says his anus is numb too.   No stool incontenence.  Some leakage of urine, but just a little bit.  Pt with hx neuropathy, not diabetic  Pt was seen in ER on 02/21/16 for numbness and was dx with peripheral neuropahty.   He turned in his [facility] discount application but has not heard on it yet  Relevant past medical, surgical, family and social history reviewed and updated as indicated. Interim medical history since our last visit reviewed. Allergies and medications reviewed and updated.   Current Outpatient Prescriptions:  .  aspirin 81 MG tablet, Take 1 tablet (81 mg total) by mouth daily., Disp: 30 tablet, Rfl:  .  B Complex Vitamins (VITAMIN B COMPLEX) TABS, Take by mouth., Disp: , Rfl:  .  cholecalciferol (VITAMIN D) 1000 units tablet, Take 1,000 Units by mouth daily., Disp: , Rfl:  .  FLUoxetine (PROZAC) 20 MG tablet, Take 1 tablet (20 mg total) by mouth daily., Disp: 30 tablet, Rfl: 0 .  gabapentin (NEURONTIN) 300 MG capsule, Take 1 capsule (300 mg total) by mouth 3 (three) times daily. Take 1 po qd x 7 days then increase to 1 po bid (Patient  taking differently: Take 300 mg by mouth 3 (three) times daily. ), Disp: 60 capsule, Rfl: 0 .  lisinopril (PRINIVIL,ZESTRIL) 40 MG tablet, Take 1 tablet (40 mg total) by mouth daily., Disp: 90 tablet, Rfl: 1 .  temazepam (RESTORIL) 30 MG capsule, TAKE ONE CAPSULE BY MOUTH AT BEDTIME AS NEEDED FOR SLEEP (Patient not taking: Reported on 06/27/2016), Disp: 30 capsule, Rfl: 1   Review of Systems  Constitutional: Negative for appetite change, chills, diaphoresis, fatigue, fever and unexpected weight change.  HENT: Negative for congestion, dental problem, drooling, ear pain, facial swelling, hearing loss, mouth sores, sneezing, sore throat, trouble swallowing and voice change.   Eyes: Negative for pain, discharge, redness, itching and visual disturbance.  Respiratory: Negative for cough, choking, shortness of breath and wheezing.   Cardiovascular: Negative for chest pain, palpitations and leg swelling.  Gastrointestinal: Negative for abdominal pain, blood in stool, constipation, diarrhea and vomiting.  Endocrine: Negative for cold intolerance, heat intolerance and polydipsia.  Genitourinary: Negative for decreased urine volume, dysuria and hematuria.  Musculoskeletal: Positive for arthralgias and gait problem. Negative for back pain.  Skin: Negative for rash.  Allergic/Immunologic: Negative for environmental allergies.  Neurological: Positive for  light-headedness. Negative for seizures, syncope and headaches.  Hematological: Negative for adenopathy.  Psychiatric/Behavioral: Positive for dysphoric mood. Negative for agitation and suicidal ideas. The patient is not nervous/anxious.     Per HPI unless specifically indicated above     Objective:    BP 140/88 (BP Location: Left Arm, Patient Position: Sitting, Cuff Size: Normal)   Pulse 98   Temp 97.7 F (36.5 C)   Ht 6\' 1"  (1.854 m)   Wt 184 lb 12 oz (83.8 kg)   SpO2 99%   BMI 24.37 kg/m   Wt Readings from Last 3 Encounters:  06/27/16 184 lb  12 oz (83.8 kg)  06/14/16 182 lb 6.4 oz (82.7 kg)  05/15/16 188 lb (85.3 kg)    Physical Exam  Constitutional: He is oriented to person, place, and time. He appears well-developed and well-nourished.  HENT:  Head: Normocephalic and atraumatic.  Neck: Neck supple.  Cardiovascular: Normal rate and regular rhythm.   Pulmonary/Chest: Effort normal and breath sounds normal. He has no wheezes.  Abdominal: Soft. Bowel sounds are normal. There is no hepatosplenomegaly. There is no tenderness.  Musculoskeletal: He exhibits no edema.       Lumbar back: He exhibits normal range of motion, no tenderness, no bony tenderness, no swelling and no edema.  SLR normal  Lymphadenopathy:    He has no cervical adenopathy.  Neurological: He is alert and oriented to person, place, and time. He has normal strength. He displays no tremor. He exhibits normal muscle tone. Gait abnormal.  Reflex Scores:      Patellar reflexes are 1+ on the right side and 1+ on the left side. Skin: Skin is warm and dry.  Psychiatric: He has a normal mood and affect. His behavior is normal.  Vitals reviewed.        Assessment & Plan:   Encounter Diagnoses  Name Primary?  . Essential hypertension Yes  . Neuropathy of both feet   . Numbness of legs   . Anus problems   . Urinary incontinence, unspecified type   . Patellar reflex decreased      -Order Mri. -order nuerology referral. -Increase gabapentin evening dose to 2 tabs (pt requests pain medication) -follow up one month.  Will call pt with mri results if abn.  Pt to RTO sooner prn

## 2016-06-30 ENCOUNTER — Ambulatory Visit (HOSPITAL_COMMUNITY)
Admission: RE | Admit: 2016-06-30 | Discharge: 2016-06-30 | Disposition: A | Discharge status: Home / Self Care | Payer: Self-pay | Source: Ambulatory Visit | Attending: Physician Assistant | Admitting: Physician Assistant

## 2016-06-30 DIAGNOSIS — M5127 Other intervertebral disc displacement, lumbosacral region: Secondary | ICD-10-CM | POA: Insufficient documentation

## 2016-06-30 DIAGNOSIS — M5137 Other intervertebral disc degeneration, lumbosacral region: Secondary | ICD-10-CM | POA: Insufficient documentation

## 2016-06-30 DIAGNOSIS — M5136 Other intervertebral disc degeneration, lumbar region: Secondary | ICD-10-CM | POA: Insufficient documentation

## 2016-06-30 DIAGNOSIS — R292 Abnormal reflex: Secondary | ICD-10-CM | POA: Insufficient documentation

## 2016-06-30 DIAGNOSIS — M5126 Other intervertebral disc displacement, lumbar region: Secondary | ICD-10-CM | POA: Insufficient documentation

## 2016-06-30 DIAGNOSIS — R32 Unspecified urinary incontinence: Secondary | ICD-10-CM | POA: Insufficient documentation

## 2016-06-30 DIAGNOSIS — R198 Other specified symptoms and signs involving the digestive system and abdomen: Secondary | ICD-10-CM | POA: Insufficient documentation

## 2016-06-30 DIAGNOSIS — M2548 Effusion, other site: Secondary | ICD-10-CM | POA: Insufficient documentation

## 2016-06-30 DIAGNOSIS — G5793 Unspecified mononeuropathy of bilateral lower limbs: Secondary | ICD-10-CM | POA: Insufficient documentation

## 2016-07-05 ENCOUNTER — Encounter (HOSPITAL_COMMUNITY)
Admission: RE | Disposition: A | Discharge status: Home / Self Care | Payer: Self-pay | Source: Ambulatory Visit | Attending: Gastroenterology

## 2016-07-05 ENCOUNTER — Encounter (HOSPITAL_COMMUNITY): Payer: Self-pay | Admitting: Gastroenterology

## 2016-07-05 ENCOUNTER — Ambulatory Visit (HOSPITAL_COMMUNITY)
Admission: RE | Admit: 2016-07-05 | Discharge: 2016-07-05 | Disposition: A | Discharge status: Home / Self Care | Payer: Self-pay | Source: Ambulatory Visit | Attending: Gastroenterology | Admitting: Gastroenterology

## 2016-07-05 DIAGNOSIS — Z7982 Long term (current) use of aspirin: Secondary | ICD-10-CM | POA: Insufficient documentation

## 2016-07-05 DIAGNOSIS — K648 Other hemorrhoids: Secondary | ICD-10-CM | POA: Insufficient documentation

## 2016-07-05 DIAGNOSIS — D123 Benign neoplasm of transverse colon: Secondary | ICD-10-CM

## 2016-07-05 DIAGNOSIS — K219 Gastro-esophageal reflux disease without esophagitis: Secondary | ICD-10-CM | POA: Insufficient documentation

## 2016-07-05 DIAGNOSIS — Z87891 Personal history of nicotine dependence: Secondary | ICD-10-CM | POA: Insufficient documentation

## 2016-07-05 DIAGNOSIS — Q438 Other specified congenital malformations of intestine: Secondary | ICD-10-CM | POA: Insufficient documentation

## 2016-07-05 DIAGNOSIS — Z79899 Other long term (current) drug therapy: Secondary | ICD-10-CM | POA: Insufficient documentation

## 2016-07-05 DIAGNOSIS — K635 Polyp of colon: Secondary | ICD-10-CM | POA: Insufficient documentation

## 2016-07-05 DIAGNOSIS — I10 Essential (primary) hypertension: Secondary | ICD-10-CM | POA: Insufficient documentation

## 2016-07-05 DIAGNOSIS — Z1211 Encounter for screening for malignant neoplasm of colon: Secondary | ICD-10-CM | POA: Insufficient documentation

## 2016-07-05 DIAGNOSIS — F329 Major depressive disorder, single episode, unspecified: Secondary | ICD-10-CM | POA: Insufficient documentation

## 2016-07-05 DIAGNOSIS — Z86010 Personal history of colonic polyps: Secondary | ICD-10-CM | POA: Insufficient documentation

## 2016-07-05 DIAGNOSIS — K644 Residual hemorrhoidal skin tags: Secondary | ICD-10-CM | POA: Insufficient documentation

## 2016-07-05 DIAGNOSIS — K649 Unspecified hemorrhoids: Secondary | ICD-10-CM

## 2016-07-05 HISTORY — PX: COLONOSCOPY: SHX5424

## 2016-07-05 SURGERY — COLONOSCOPY
Anesthesia: Moderate Sedation

## 2016-07-05 MED ORDER — MEPERIDINE HCL 100 MG/ML IJ SOLN
INTRAMUSCULAR | Status: DC | PRN
  Administered 2016-07-05 (×3): 25 mg via INTRAVENOUS

## 2016-07-05 MED ORDER — MIDAZOLAM HCL 5 MG/5ML IJ SOLN
INTRAMUSCULAR | Status: AC
  Filled 2016-07-05: qty 10

## 2016-07-05 MED ORDER — PROMETHAZINE HCL 25 MG/ML IJ SOLN
INTRAMUSCULAR | Status: AC
  Filled 2016-07-05: qty 1

## 2016-07-05 MED ORDER — MIDAZOLAM HCL 5 MG/5ML IJ SOLN
INTRAMUSCULAR | Status: DC | PRN
  Administered 2016-07-05 (×3): 2 mg via INTRAVENOUS

## 2016-07-05 MED ORDER — SODIUM CHLORIDE 0.9% FLUSH
INTRAVENOUS | Status: AC
  Filled 2016-07-05: qty 10

## 2016-07-05 MED ORDER — SODIUM CHLORIDE 0.9 % IV SOLN
INTRAVENOUS | Status: DC
  Administered 2016-07-05: 11:00:00 via INTRAVENOUS

## 2016-07-05 MED ORDER — ONDANSETRON HCL 4 MG/2ML IJ SOLN
INTRAMUSCULAR | Status: AC
  Filled 2016-07-05: qty 2

## 2016-07-05 MED ORDER — ONDANSETRON HCL 4 MG/2ML IJ SOLN
INTRAMUSCULAR | Status: DC | PRN
  Administered 2016-07-05: 4 mg via INTRAVENOUS

## 2016-07-05 MED ORDER — MEPERIDINE HCL 100 MG/ML IJ SOLN
INTRAMUSCULAR | Status: AC
  Filled 2016-07-05: qty 2

## 2016-07-05 MED ORDER — PROMETHAZINE HCL 25 MG/ML IJ SOLN
12.5000 mg | Freq: Once | INTRAMUSCULAR | Status: AC
  Administered 2016-07-05: 12.5 mg via INTRAVENOUS

## 2016-07-05 NOTE — H&P (Signed)
Primary Care Physician:  Soyla Dryer, PA-C Primary Gastroenterologist:  Dr. Oneida Alar  Pre-Procedure History & Physical: HPI:  Tristan Cook is a 56 y.o. male here for  PERSONAL HISTORY OF POLYPS.  Past Medical History:  Diagnosis Date  . Abdominal pain   . Adenomatous colon polyp 2008  . Arthritis   . Back pain   . Constipation   . Depression   . GERD (gastroesophageal reflux disease)   . Gout   . Hemorrhoids   . High cholesterol   . Hypertension   . Neck pain   . Substance abuse    h/o excessive alcohol use; pt says he limits use to one to 2 beers daily he currently    Past Surgical History:  Procedure Laterality Date  . COLONOSCOPY  10/09/2006   3 mm pedunculated sigmoid colon polyp removed/8-mm sessile hepatic flexure polyp (tubular villous adenoma) removed/6-mm  descending colon polyp removed/small internal hemorrhoids  . COLONOSCOPY  02/28/2011   LEX:[id number], multiple in the rectum/Internal hemorrhoids, MODERATE-CAUSING RECTAL BLEEDING  . 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 Mild erosive gastrtitis most likely due to indomethacin   . FINGER SURGERY Right    4th and 5th digit  . FLEXIBLE SIGMOIDOSCOPY  01/02/2012   SLF: 1. the colonic mucosa appeared normal in the sigmoid colon 2. Large internal hemorrhoids: cause for rectal bleeding/pain . s/p banding X 3   . HAND SURGERY    . SAVORY DILATION N/A 07/21/2014   Procedure: SAVORY DILATION;  Surgeon: [name], MD;  Location: [facility];  Service: Endoscopy;  Laterality: N/A;    Prior to Admission medications   Medication Sig Start Date End Date Taking? Authorizing Provider  aspirin 81 MG tablet Take 1 tablet (81 mg total) by mouth daily. 10/06/13  Yes Fayrene Helper, MD  B Complex Vitamins (VITAMIN B COMPLEX) TABS Take 1 tablet by mouth daily.    Yes Historical Provider, MD  Calcium Carbonate Antacid (ANTACID PO) Take 1 tablet by mouth daily.    Yes Historical Provider, MD  cholecalciferol (VITAMIN D) 1000 units tablet Take 1,000 Units by mouth daily.   Yes Historical Provider, MD  diphenhydrAMINE (BENADRYL) 25 MG tablet Take 12.5 mg by mouth daily as needed for allergies.   Yes Historical Provider, MD  FLUoxetine (PROZAC) 20 MG tablet Take 1 tablet (20 mg total) by mouth daily. 02/29/16  Yes Soyla Dryer, PA-C  gabapentin (NEURONTIN) 300 MG capsule Take 1 capsule (300 mg total) by mouth 3 (three) times daily. Take 1 po qd x 7 days then increase to 1 po bid Patient taking differently: Take 300 mg by mouth 3 (three) times daily.  04/17/16  Yes Soyla Dryer, PA-C  lisinopril (PRINIVIL,ZESTRIL) 40 MG tablet Take 1 tablet (40 mg total) by mouth daily. 05/15/16  Yes Soyla Dryer, PA-C  temazepam (RESTORIL) 30 MG capsule TAKE ONE CAPSULE BY MOUTH AT BEDTIME AS NEEDED FOR SLEEP 12/17/15  Yes Fayrene Helper, MD  testosterone cypionate (DEPOTESTOSTERONE CYPIONATE) 200 MG/ML injection Inject 100 mg into the muscle 2 (two) times a week. 05/04/16  Yes Historical Provider, MD    Allergies as of 06/14/2016  . (No Known Allergies)    Family History  Problem Relation Age of Onset  . Hypertension Mother   . Hypertension Father   . Stroke Father   . Hypertension Sister   . Hypertension Brother   . Cancer Brother   . Cancer Brother  throat cancer  . Hypertension Brother   . Stroke Brother     2x  . Colon cancer Neg Hx     Social History   Social History  . Marital status: Single    Spouse name: N/A  . Number of children: 0  . Years of education: N/A   Occupational History  . unemployed, Arts development officer Unemployed   Social History Main Topics  . Smoking status: Former Smoker    Packs/day: 1.50    Years: 33.00    Types: Cigarettes    Quit date: 03/13/2004  . Smokeless tobacco: Current User    Types: Snuff     Comment: Dips every once in a while  . Alcohol use 3.6 oz/week    6 Cans of beer per week     Comment: drinks 6  pack of beer on weekends  . Drug use: No  . Sexual activity: Not on file   Other Topics Concern  . Not on file   Social History Narrative  . No narrative on file    Review of Systems: See HPI, otherwise negative ROS   Physical Exam: There were no vitals taken for this visit. General:   Alert,  pleasant and cooperative in NAD Head:  Normocephalic and atraumatic. Neck:  Supple; Lungs:  Clear throughout to auscultation.    Heart:  Regular rate and rhythm. Abdomen:  Soft, nontender and nondistended. Normal bowel sounds, without guarding, and without rebound.   Neurologic:  Alert and  oriented x4;  grossly normal neurologically.  Impression/Plan:    PERSONAL HISTORY OF POLYPS.  PLAN: 1. TCS TODAY. DISCUSSED PROCEDURE, BENEFITS, & RISKS: < 1% chance of medication reaction, bleeding, perforation, or rupture of spleen/liver.

## 2016-07-05 NOTE — Discharge Instructions (Signed)
You had 1 polyp removed. You have internal AND EXTERNAL hemorrhoids.    DRINK WATER TO KEEP YOUR URINE LIGHT YELLOW.  FOLLOW A HIGH FIBER DIET. AVOID ITEMS THAT CAUSE BLOATING & GAS. SEE INFO BELOW.  YOUR BIOPSY RESULTS WILL BE AVAILABLE IN MY CHART AFTER APR 27 AND MY OFFICE WILL CONTACT YOU IN 10-14 DAYS WITH YOUR RESULTS.   Next colonoscopy in 5 years.    Colonoscopy Care After Read the instructions outlined below and refer to this sheet in the next week. These discharge instructions provide you with general information on caring for yourself after you leave the hospital. While your treatment has been planned according to the most current medical practices available, unavoidable complications occasionally occur. If you have any problems or questions after discharge, call DR. Tawan Corkern, [phone].  ACTIVITY  You may resume your regular activity, but move at a slower pace for the next 24 hours.   Take frequent rest periods for the next 24 hours.   Walking will help get rid of the air and reduce the bloated feeling in your belly (abdomen).   No driving for 24 hours (because of the medicine (anesthesia) used during the test).   You may shower.   Do not sign any important legal documents or operate any machinery for 24 hours (because of the anesthesia used during the test).    NUTRITION  Drink plenty of fluids.   You may resume your normal diet as instructed by your doctor.   Begin with a light meal and progress to your normal diet. Heavy or fried foods are harder to digest and may make you feel sick to your stomach (nauseated).   Avoid alcoholic beverages for 24 hours or as instructed.    MEDICATIONS  You may resume your normal medications.   WHAT YOU CAN EXPECT TODAY  Some feelings of bloating in the abdomen.   Passage of more gas than usual.   Spotting of blood in your stool or on the toilet paper  .  IF YOU HAD POLYPS REMOVED DURING THE COLONOSCOPY:  Eat a  soft diet IF YOU HAVE NAUSEA, BLOATING, ABDOMINAL PAIN, OR VOMITING.    FINDING OUT THE RESULTS OF YOUR TEST Not all test results are available during your visit. DR. Oneida Alar WILL CALL YOU WITHIN 14 DAYS OF YOUR PROCEDUE WITH YOUR RESULTS. Do not assume everything is normal if you have not heard from DR. Shanee Batch, CALL HER OFFICE AT [phone].  SEEK IMMEDIATE MEDICAL ATTENTION AND CALL THE OFFICE: [phone] IF:  You have more than a spotting of blood in your stool.   Your belly is swollen (abdominal distention).   You are nauseated or vomiting.   You have a temperature over 101F.   You have abdominal pain or discomfort that is severe or gets worse throughout the day.   High-Fiber Diet A high-fiber diet changes your normal diet to include more whole grains, legumes, fruits, and vegetables. Changes in the diet involve replacing refined carbohydrates with unrefined foods. The calorie level of the diet is essentially unchanged. The Dietary Reference Intake (recommended amount) for adult males is 38 grams per day. For adult females, it is 25 grams per day. Pregnant and lactating women should consume 28 grams of fiber per day. Fiber is the intact part of a plant that is not broken down during digestion. Functional fiber is fiber that has been isolated from the plant to provide a beneficial effect in the body. PURPOSE  Increase stool bulk.  Ease and regulate bowel movements.   Lower cholesterol.   REDUCE RISK OF COLON CANCER  INDICATIONS THAT YOU NEED MORE FIBER  Constipation and hemorrhoids.   Uncomplicated diverticulosis (intestine condition) and irritable bowel syndrome.   Weight management.   As a protective measure against hardening of the arteries (atherosclerosis), diabetes, and cancer.   GUIDELINES FOR INCREASING FIBER IN THE DIET  Start adding fiber to the diet slowly. A gradual increase of about 5 more grams (2 slices of whole-wheat bread, 2 servings of most fruits  or vegetables, or 1 bowl of high-fiber cereal) per day is best. Too rapid an increase in fiber may result in constipation, flatulence, and bloating.   Drink enough water and fluids to keep your urine clear or pale yellow. Water, juice, or caffeine-free drinks are recommended. Not drinking enough fluid may cause constipation.   Eat a variety of high-fiber foods rather than one type of fiber.   Try to increase your intake of fiber through using high-fiber foods rather than fiber pills or supplements that contain small amounts of fiber.   The goal is to change the types of food eaten. Do not supplement your present diet with high-fiber foods, but replace foods in your present diet.   INCLUDE A VARIETY OF FIBER SOURCES  Replace refined and processed grains with whole grains, canned fruits with fresh fruits, and incorporate other fiber sources. White rice, white breads, and most bakery goods contain little or no fiber.   Brown whole-grain rice, buckwheat oats, and many fruits and vegetables are all good sources of fiber. These include: broccoli, Brussels sprouts, cabbage, cauliflower, beets, sweet potatoes, white potatoes (skin on), carrots, tomatoes, eggplant, squash, berries, fresh fruits, and dried fruits.   Cereals appear to be the richest source of fiber. Cereal fiber is found in whole grains and bran. Bran is the fiber-rich outer coat of cereal grain, which is largely removed in refining. In whole-grain cereals, the bran remains. In breakfast cereals, the largest amount of fiber is found in those with "bran" in their names. The fiber content is sometimes indicated on the label.   You may need to include additional fruits and vegetables each day.   In baking, for 1 cup white flour, you may use the following substitutions:   1 cup whole-wheat flour minus 2 tablespoons.   1/2 cup white flour plus 1/2 cup whole-wheat flour.   Polyps, Colon  A polyp is extra tissue that grows inside your body.  Colon polyps grow in the large intestine. The large intestine, also called the colon, is part of your digestive system. It is a long, hollow tube at the end of your digestive tract where your body makes and stores stool. Most polyps are not dangerous. They are benign. This means they are not cancerous. But over time, some types of polyps can turn into cancer. Polyps that are smaller than a pea are usually not harmful. But larger polyps could someday become or may already be cancerous. To be safe, doctors remove all polyps and test them.   PREVENTION There is not one sure way to prevent polyps. You might be able to lower your risk of getting them if you:  Eat more fruits and vegetables and less fatty food.   Do not smoke.   Avoid alcohol.   Exercise every day.   Lose weight if you are overweight.   Eating more calcium and folate can also lower your risk of getting polyps. Some foods that are rich  in calcium are milk, cheese, and broccoli. Some foods that are rich in folate are chickpeas, kidney beans, and spinach.   Hemorrhoids Hemorrhoids are dilated (enlarged) veins around the rectum. Sometimes clots will form in the veins. This makes them swollen and painful. These are called thrombosed hemorrhoids. Causes of hemorrhoids include:  Constipation.   Straining to have a bowel movement.   HEAVY LIFTING  HOME CARE INSTRUCTIONS  Eat a well balanced diet and drink 6 to 8 glasses of water every day to avoid constipation. You may also use a bulk laxative.   Avoid straining to have bowel movements.   Keep anal area dry and clean.   Do not use a donut shaped pillow or sit on the toilet for long periods. This increases blood pooling and pain.   Move your bowels when your body has the urge; this will require less straining and will decrease pain and pressure.

## 2016-07-05 NOTE — Op Note (Signed)
[facility] Patient Name: Tristan Cook Procedure Date: 07/05/2016 11:10 AM MRN: [id number] Date of Birth: 10-15-1960 Attending MD: Barney Drain , MD CSN: [id number] Age: 56 Admit Type: Outpatient Procedure:                Colonoscopy WITH COLD SNARE POLYPECTOMY Indications:              High risk colon cancer surveillance: Personal                            history of colonic polyps Providers:                Barney Drain, MD, Janeece Riggers, RN, Bonnetta Barry,                            Technician Referring MD:             Soyla Dryer PA-C, PA Medicines:                Promethazine 12.5 mg IV, Ondansetron 4 mg IV,                            Meperidine 75 mg IV, Midazolam 6 mg IV Complications:            No immediate complications. Estimated Blood Loss:     Estimated blood loss was minimal. Procedure:                Pre-Anesthesia Assessment:                           - Prior to the procedure, a History and Physical                            was performed, and patient medications and                            allergies were reviewed. The patient's tolerance of                            previous anesthesia was also reviewed. The risks                            and benefits of the procedure and the sedation                            options and risks were discussed with the patient.                            All questions were answered, and informed consent                            was obtained. Prior Anticoagulants: The patient has                            taken aspirin, last dose was 1 day prior to  procedure. ASA Grade Assessment: II - A patient                            with mild systemic disease. After reviewing the                            risks and benefits, the patient was deemed in                            satisfactory condition to undergo the procedure.                            After obtaining informed consent, the colonoscope                             was passed under direct vision. Throughout the                            procedure, the patient's blood pressure, pulse, and                            oxygen saturations were monitored continuously. The                            EC-[id number] ([id number]) scope was introduced through                            the anus and advanced to the the cecum, identified                            by appendiceal orifice and ileocecal valve. The                            colonoscopy was somewhat difficult due to a                            tortuous colon. Successful completion of the                            procedure was aided by COLOWRAP. The patient                            tolerated the procedure well. The quality of the                            bowel preparation was excellent. The ileocecal                            valve, appendiceal orifice, and rectum were                            photographed. Scope In: 11:32:50 AM Scope Out: 37:16:96 AM Scope Withdrawal Time: 0 hours 10 minutes 26 seconds  Total Procedure Duration: 0  hours 13 minutes 13 seconds  Findings:      A 5 mm polyp was found in the mid transverse colon. The polyp was       sessile. The polyp was removed with a cold snare. Resection and       retrieval were complete.      The recto-sigmoid colon and sigmoid colon were moderately redundant.      External and internal hemorrhoids were found during retroflexion. The       hemorrhoids were small. Impression:               - One 5 mm polyp in the mid transverse colon,                            removed with a cold snare. Resected and retrieved.                           - Redundant colon.                           - External and internal hemorrhoids. Moderate Sedation:      Moderate (conscious) sedation was administered by the endoscopy nurse       and supervised by the endoscopist. The following parameters were       monitored: oxygen saturation,  heart rate, blood pressure, and response       to care. Total physician intraservice time was 33 minutes. Recommendation:           - Repeat colonoscopy in 5 years for surveillance.                           - High fiber diet.                           - Continue present medications.                           - Await pathology results.                           - Patient has a contact number available for                            emergencies. The signs and symptoms of potential                            delayed complications were discussed with the                            patient. Return to normal activities tomorrow.                            Written discharge instructions were provided to the                            patient. Procedure Code(s):        --- Professional ---  [phone], Colonoscopy, flexible; with removal of                            tumor(s), polyp(s), or other lesion(s) by snare                            technique                           99152, Moderate sedation services provided by the                            same physician or other qualified health care                            professional performing the diagnostic or                            therapeutic service that the sedation supports,                            requiring the presence of an independent trained                            observer to assist in the monitoring of the                            patient's level of consciousness and physiological                            status; initial 15 minutes of intraservice time,                            patient age 36 years or older                           [phone], Moderate sedation services; each additional                            15 minutes intraservice time Diagnosis Code(s):        --- Professional ---                           Z86.010, Personal history of colonic polyps                           D12.3, Benign  neoplasm of transverse colon (hepatic                            flexure or splenic flexure)                           K64.8, Other hemorrhoids                           Q43.8, Other specified congenital  malformations of                            intestine CPT copyright 2016 [employer]. All rights reserved. The codes documented in this report are preliminary and upon coder review may  be revised to meet current compliance requirements. Barney Drain, MD Barney Drain, MD 07/05/2016 11:54:04 AM This report has been signed electronically. Number of Addenda: 0

## 2016-07-10 ENCOUNTER — Encounter (HOSPITAL_COMMUNITY): Payer: Self-pay | Admitting: Gastroenterology

## 2016-07-17 ENCOUNTER — Ambulatory Visit: Payer: Self-pay | Admitting: Neurology

## 2016-07-22 ENCOUNTER — Telehealth: Payer: Self-pay | Admitting: Gastroenterology

## 2016-07-22 NOTE — Telephone Encounter (Signed)
Please call pt. HE had a polypoid lesion removed.    DRINK WATER TO KEEP YOUR URINE LIGHT YELLOW.  FOLLOW A HIGH FIBER DIET. AVOID ITEMS THAT CAUSE BLOATING & GAS.   Next colonoscopy in 5 years.

## 2016-07-24 NOTE — Telephone Encounter (Signed)
Reminder in epic °

## 2016-07-24 NOTE — Telephone Encounter (Signed)
LMOM to call.

## 2016-07-24 NOTE — Telephone Encounter (Signed)
Pt is aware of results. 

## 2016-07-26 ENCOUNTER — Ambulatory Visit: Payer: Self-pay | Admitting: Physician Assistant

## 2016-07-26 ENCOUNTER — Other Ambulatory Visit: Payer: Self-pay | Admitting: Physician Assistant

## 2016-08-04 ENCOUNTER — Ambulatory Visit: Payer: Self-pay | Admitting: Neurology

## 2016-08-15 DIAGNOSIS — F4321 Adjustment disorder with depressed mood: Secondary | ICD-10-CM | POA: Insufficient documentation

## 2016-08-18 ENCOUNTER — Encounter (HOSPITAL_COMMUNITY): Payer: Self-pay | Admitting: Emergency Medicine

## 2016-08-18 ENCOUNTER — Emergency Department (HOSPITAL_COMMUNITY)
Admission: EM | Admit: 2016-08-18 | Discharge: 2016-08-18 | Disposition: A | Discharge status: Home / Self Care | Payer: Self-pay | Attending: Emergency Medicine | Admitting: Emergency Medicine

## 2016-08-18 DIAGNOSIS — Z87891 Personal history of nicotine dependence: Secondary | ICD-10-CM | POA: Insufficient documentation

## 2016-08-18 DIAGNOSIS — I10 Essential (primary) hypertension: Secondary | ICD-10-CM | POA: Insufficient documentation

## 2016-08-18 DIAGNOSIS — Z7982 Long term (current) use of aspirin: Secondary | ICD-10-CM | POA: Insufficient documentation

## 2016-08-18 DIAGNOSIS — Z79899 Other long term (current) drug therapy: Secondary | ICD-10-CM | POA: Insufficient documentation

## 2016-08-18 DIAGNOSIS — M5417 Radiculopathy, lumbosacral region: Secondary | ICD-10-CM | POA: Insufficient documentation

## 2016-08-18 HISTORY — DX: Unspecified mononeuropathy of bilateral lower limbs: G57.93

## 2016-08-18 HISTORY — DX: Other chronic pain: G89.29

## 2016-08-18 HISTORY — DX: Radiculopathy, lumbar region: M54.16

## 2016-08-18 HISTORY — DX: Dorsalgia, unspecified: M54.9

## 2016-08-18 HISTORY — DX: Anesthesia of skin: R20.0

## 2016-08-18 LAB — CBC WITH DIFFERENTIAL/PLATELET
BASOS ABS: 0 10*3/uL (ref 0.0–0.1)
BASOS PCT: 0 %
EOS ABS: 0.2 10*3/uL (ref 0.0–0.7)
Eosinophils Relative: 2 %
HEMATOCRIT: 43.7 % (ref 39.0–52.0)
HEMOGLOBIN: 15.9 g/dL (ref 13.0–17.0)
Lymphocytes Relative: 18 %
Lymphs Abs: 1.4 10*3/uL (ref 0.7–4.0)
MCH: 35.7 pg — ABNORMAL HIGH (ref 26.0–34.0)
MCHC: 36.4 g/dL — AB (ref 30.0–36.0)
MCV: 98 fL (ref 78.0–100.0)
Monocytes Absolute: 1.1 10*3/uL — ABNORMAL HIGH (ref 0.1–1.0)
Monocytes Relative: 15 %
NEUTROS ABS: 5 10*3/uL (ref 1.7–7.7)
NEUTROS PCT: 65 %
Platelets: 306 10*3/uL (ref 150–400)
RBC: 4.46 MIL/uL (ref 4.22–5.81)
RDW: 14.6 % (ref 11.5–15.5)
WBC: 7.7 10*3/uL (ref 4.0–10.5)

## 2016-08-18 LAB — BASIC METABOLIC PANEL
ANION GAP: 10 (ref 5–15)
BUN: 7 mg/dL (ref 6–20)
CHLORIDE: 100 mmol/L — AB (ref 101–111)
CO2: 27 mmol/L (ref 22–32)
CREATININE: 1.23 mg/dL (ref 0.61–1.24)
Calcium: 9.3 mg/dL (ref 8.9–10.3)
GFR calc non Af Amer: >60 mL/min (ref >60)
Glucose, Bld: 101 mg/dL — ABNORMAL HIGH (ref 65–99)
POTASSIUM: 3.8 mmol/L (ref 3.5–5.1)
SODIUM: 137 mmol/L (ref 135–145)

## 2016-08-18 MED ORDER — PREDNISONE 10 MG PO TABS: ORAL_TABLET | ORAL | 0 refills | Status: DC

## 2016-08-18 MED ORDER — PREDNISONE 50 MG PO TABS
60.0000 mg | ORAL_TABLET | Freq: Once | ORAL | Status: AC
  Administered 2016-08-18: 60 mg via ORAL
  Filled 2016-08-18: qty 1

## 2016-08-18 NOTE — ED Triage Notes (Signed)
C/o numbness in pelvic region,no bowel movement for 2 days and difficulty urinating

## 2016-08-18 NOTE — ED Provider Notes (Signed)
[facility] DEPT Provider Note   CSN: [id number] Arrival date & time: 08/18/16  1644     History   Chief Complaint Chief Complaint  Patient presents with  . Numbness    HPI Tristan Cook is a 56 y.o. male with a history of low back pain and lumbar radiculopathy which he states is chronic with numbness to his bilateral toes which is treated with neurontin presenting with a 2 day history of difficulty urinating, stating he has to push to empty his bladder in association with new numbness in his perineum.  He denies any worsened low back pain or increased numbness or weakness in his legs and denies having any low back pain at this time. He has had no fevers, chills, hematuria, or painful urination.  He also reports has had no bm x 2 day which is abnormal for him. He denies abdominal or rectal pain nor urgency to have a bm.  The history is provided by the patient.    Past Medical History:  Diagnosis Date  . Abdominal pain   . Adenomatous colon polyp 2008  . Arthritis   . Back pain   . Bilateral lumbar radiculopathy   . Chronic back pain   . Constipation   . Depression   . GERD (gastroesophageal reflux disease)   . Gout   . Hemorrhoids   . High cholesterol   . Hypertension   . Neck pain   . Neuropathy of both feet   . Numbness    rectal  . Numbness of legs   . Substance abuse    h/o excessive alcohol use; pt says he limits use to one to 2 beers daily he currently    Patient Active Problem List   Diagnosis Date Noted  . History of colonic polyps 06/14/2016  . Elevated blood pressure reading 06/14/2016  . Hemorrhoids 09/20/2015  . Depression 08/03/2015  . Back pain with radiation 06/15/2015  . Insomnia 06/03/2015  . Hyperlipidemia LDL goal <130 04/25/2015  . 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 Fatigue 03/29/2015  . Allergic rhinitis 02/10/2015  . Esophageal stricture 10/22/2014  . Overweight (BMI 25.0-29.9) 10/06/2013  . GERD (gastroesophageal reflux disease)  04/23/2012  . Hemorrhoids, internal 11/02/2011  . Essential hypertension 07/25/2007  . Neck pain on left side 07/25/2007    Past Surgical History:  Procedure Laterality Date  . COLONOSCOPY  10/09/2006   3 mm pedunculated sigmoid colon polyp removed/8-mm sessile hepatic flexure polyp (tubular villous adenoma) removed/6-mm  descending colon polyp removed/small internal hemorrhoids  . COLONOSCOPY  02/28/2011   [id number]:[id number], multiple in the rectum/Internal hemorrhoids, MODERATE-CAUSING RECTAL BLEEDING  . COLONOSCOPY N/A 07/05/2016   Procedure: COLONOSCOPY;  Surgeon: [name], MD;  Location: [facility];  Service: Endoscopy;  Laterality: N/A;  11:15am  . 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 Mild erosive gastrtitis most likely due to indomethacin   . FINGER SURGERY Right    4th and 5th digit  . FLEXIBLE SIGMOIDOSCOPY  01/02/2012   SLF: 1. the colonic mucosa appeared normal in the sigmoid colon 2. Large internal hemorrhoids: cause for rectal bleeding/pain . s/p banding X 3   . HAND SURGERY    . SAVORY DILATION N/A 07/21/2014   Procedure: SAVORY DILATION;  Surgeon: [name], MD;  Location: [facility];  Service: Endoscopy;  Laterality: N/A;       Home Medications    Prior to Admission medications   Medication Sig  Start Date End Date Taking? Authorizing Provider  aspirin 81 MG tablet Take 1 tablet (81 mg total) by mouth daily. 10/06/13  Yes Fayrene Helper, MD  B Complex Vitamins (VITAMIN B COMPLEX) TABS Take 1 tablet by mouth daily.    Yes [provider]  Calcium Carbonate Antacid (ANTACID PO) Take 1 tablet by mouth daily.   Yes [provider]  cholecalciferol (VITAMIN D) 1000 units tablet Take 1,000 Units by mouth daily.   Yes [provider]  diphenhydrAMINE (BENADRYL) 25 MG tablet Take 12.5 mg by mouth daily as needed for allergies.   Yes [provider]  FLUoxetine  (PROZAC) 20 MG tablet Take 1 tablet (20 mg total) by mouth daily. 02/29/16  Yes Soyla Dryer, PA-C  lisinopril (PRINIVIL,ZESTRIL) 40 MG tablet Take 1 tablet (40 mg total) by mouth daily. 05/15/16  Yes Soyla Dryer, PA-C  testosterone cypionate (DEPOTESTOSTERONE CYPIONATE) 200 MG/ML injection Inject 100 mg into the muscle 2 (two) times a week. 05/04/16  Yes [provider]  traZODone (DESYREL) 100 MG tablet Take 150 mg by mouth at bedtime.   Yes [provider]  gabapentin (NEURONTIN) 300 MG capsule Take 1 capsule (300 mg total) by mouth 3 (three) times daily. Take 1 po qd x 7 days then increase to 1 po bid Patient not taking: Reported on 08/18/2016 04/17/16   Soyla Dryer, PA-C  predniSONE (DELTASONE) 10 MG tablet Take 6 tablets day one, 5 tablets day two, 4 tablets day three, 3 tablets day four, 2 tablets day five, then 1 tablet day six 08/18/16   Evalee Jefferson, PA-C    Family History Family History  Problem Relation Age of Onset  . Hypertension Mother   . Hypertension Father   . Stroke Father   . Hypertension Sister   . Hypertension Brother   . Cancer Brother   . Cancer Brother        throat cancer  . Hypertension Brother   . Stroke Brother        2x  . Colon cancer Neg Hx     Social History Social History  Substance Use Topics  . Smoking status: Former Smoker    Packs/day: 1.50    Years: 33.00    Types: Cigarettes    Quit date: 03/13/2004  . Smokeless tobacco: Current User    Types: Snuff     Comment: Dips every once in a while  . Alcohol use 3.6 oz/week    6 Cans of beer per week     Comment: drinks 6 pack of beer on weekends     Allergies   Benadryl [diphenhydramine hcl (sleep)]   Review of Systems Review of Systems  Constitutional: Negative for chills and fever.  Respiratory: Negative for shortness of breath.   Cardiovascular: Negative for chest pain and leg swelling.  Gastrointestinal: Positive for constipation. Negative for abdominal  distention, abdominal pain, nausea and vomiting.  Genitourinary: Positive for difficulty urinating. Negative for decreased urine volume, dysuria, flank pain, frequency and urgency.  Musculoskeletal: Positive for back pain. Negative for gait problem and joint swelling.  Skin: Negative for rash.  Neurological: Positive for numbness. Negative for weakness.     Physical Exam Updated Vital Signs BP 118/69 (BP Location: Right Arm)   Pulse 68   Temp 98.4 F (36.9 C) (Oral)   Resp 18   Ht 6\' 2"  (1.88 m)   Wt 82.1 kg (181 lb)   SpO2 97%   BMI 23.24 kg/m  Physical Exam  Constitutional: He appears well-developed and well-nourished.  HENT:  Head: Normocephalic.  Eyes: Conjunctivae are normal.  Neck: Normal range of motion. Neck supple.  Cardiovascular: Normal rate and intact distal pulses.   Pedal pulses normal.  Pulmonary/Chest: Effort normal.  Abdominal: Soft. Bowel sounds are normal. He exhibits no distension and no mass.  Genitourinary: Prostate normal. Rectal exam shows no tenderness and anal tone normal. Prostate is not tender.  Genitourinary Comments: Perineal numbness to fine touch.  Cremasteric reflex present. No fecal impaction.  Chaperone was present during exam.   Musculoskeletal: Normal range of motion. He exhibits no edema.       Lumbar back: He exhibits tenderness. He exhibits no swelling, no edema and no spasm.  Neurological: He is alert. He has normal strength. He displays no atrophy and no tremor. A sensory deficit is present. Gait normal.  Reflex Scores:      Patellar reflexes are 2+ on the right side and 2+ on the left side.      Achilles reflexes are 2+ on the right side and 2+ on the left side. No strength deficit noted in hip and knee flexor and extensor muscle groups.  Ankle flexion and extension intact.  Sensation present in lower extremities but reduced.  Skin: Skin is warm and dry.  Psychiatric: He has a normal mood and affect.  Nursing note and vitals  reviewed.    ED Treatments / Results  Labs (all labs ordered are listed, but only abnormal results are displayed)   Results for orders placed or performed during the hospital encounter of 08/18/16  CBC with Differential  Result Value Ref Range   WBC 7.7 4.0 - 10.5 K/uL   RBC 4.46 4.22 - 5.81 MIL/uL   Hemoglobin 15.9 13.0 - 17.0 g/dL   HCT 43.7 39.0 - 52.0 %   MCV 98.0 78.0 - 100.0 fL   MCH 35.7 (H) 26.0 - 34.0 pg   MCHC 36.4 (H) 30.0 - 36.0 g/dL   RDW 14.6 11.5 - 15.5 %   Platelets 306 150 - 400 K/uL   Neutrophils Relative % 65 %   Neutro Abs 5.0 1.7 - 7.7 K/uL   Lymphocytes Relative 18 %   Lymphs Abs 1.4 0.7 - 4.0 K/uL   Monocytes Relative 15 %   Monocytes Absolute 1.1 (H) 0.1 - 1.0 K/uL   Eosinophils Relative 2 %   Eosinophils Absolute 0.2 0.0 - 0.7 K/uL   Basophils Relative 0 %   Basophils Absolute 0.0 0.0 - 0.1 K/uL  Basic metabolic panel  Result Value Ref Range   Sodium 137 135 - 145 mmol/L   Potassium 3.8 3.5 - 5.1 mmol/L   Chloride 100 (L) 101 - 111 mmol/L   CO2 27 22 - 32 mmol/L   Glucose, Bld 101 (H) 65 - 99 mg/dL   BUN 7 6 - 20 mg/dL   Creatinine, Ser 1.23 0.61 - 1.24 mg/dL   Calcium 9.3 8.9 - 10.3 mg/dL   GFR calc non Af Amer >60 >60 mL/min   GFR calc Af Amer >60 >60 mL/min   Anion gap 10 5 - 15   No results found.  EKG  EKG Interpretation None       Radiology No results found.  Procedures Procedures (including critical care time)  Medications Ordered in ED Medications  predniSONE (DELTASONE) tablet 60 mg (60 mg Oral Given 08/18/16 2058)     Initial Impression / Assessment and Plan / ED Course  I  have reviewed the triage vital signs and the nursing notes.  Pertinent labs & imaging results that were available during my care of the patient were reviewed by me and considered in my medical decision making (see chart for details).     Pt underwent a post void bladder scan with residual urine 30cc.   MRI from 4/20 /18   IMPRESSION:  L4-5: Disc degeneration with a shallow protrusion more prominent in the left foraminal to extraforaminal region. Facet degeneration and hypertrophy, left more than right, with a joint effusion on the left and bone marrow edema. The L4 nerve roots could be affected, particularly on the left. The left facet arthropathy could be a cause of back pain or referred facet syndrome pain.  L5-S1: Disc degeneration with shallow biforaminal protrusion slightly more prominent on the left. Mild facet hypertrophy but without edema. There would be potential for irritation of either or both L5 nerve roots, more likely the left.   Electronically Signed   By: Nelson Chimes M.D.   On: 06/30/2016 16:07    No neuro deficit on exam or by history to suggest emergent or surgical presentation although by hx, pt has progressive radiculopathy, but no neuro deficit.  Discussed worsened sx that should prompt immediate re-evaluation including distal weakness, bowel/bladder retention/incontinence. Plan f/u with pcp for a recheck this week.        Final Clinical Impressions(s) / ED Diagnoses   Final diagnoses:  Lumbosacral radiculopathy    New Prescriptions Discharge Medication List as of 08/18/2016  8:48 PM    START taking these medications   Details  predniSONE (DELTASONE) 10 MG tablet Take 6 tablets day one, 5 tablets day two, 4 tablets day three, 3 tablets day four, 2 tablets day five, then 1 tablet day six, Print         Evalee Jefferson, PA-C 08/19/16 1241    Nat Christen, MD 08/19/16 1544

## 2016-08-18 NOTE — Discharge Instructions (Signed)
Your exam is reassuring that you do not have a lumbar surgical emergency as discussed.  Take your next dose of prednisone tomorrow evening as you received your first dose here.  This can help with inflammation and may reduce your symptoms.  Use a heating pad applied to your lower back several times daily.

## 2016-08-18 NOTE — ED Notes (Signed)
Bladder scan revealed 29ml in bladder.

## 2016-08-29 ENCOUNTER — Emergency Department (HOSPITAL_COMMUNITY)
Admission: EM | Admit: 2016-08-29 | Discharge: 2016-08-29 | Disposition: A | Discharge status: Home / Self Care | Payer: Self-pay | Attending: Emergency Medicine | Admitting: Emergency Medicine

## 2016-08-29 ENCOUNTER — Encounter (HOSPITAL_COMMUNITY): Payer: Self-pay | Admitting: Emergency Medicine

## 2016-08-29 DIAGNOSIS — K5900 Constipation, unspecified: Secondary | ICD-10-CM | POA: Insufficient documentation

## 2016-08-29 DIAGNOSIS — Z7982 Long term (current) use of aspirin: Secondary | ICD-10-CM | POA: Insufficient documentation

## 2016-08-29 DIAGNOSIS — F17220 Nicotine dependence, chewing tobacco, uncomplicated: Secondary | ICD-10-CM | POA: Insufficient documentation

## 2016-08-29 DIAGNOSIS — Z79899 Other long term (current) drug therapy: Secondary | ICD-10-CM | POA: Insufficient documentation

## 2016-08-29 DIAGNOSIS — I10 Essential (primary) hypertension: Secondary | ICD-10-CM | POA: Insufficient documentation

## 2016-08-29 MED ORDER — POLYETHYLENE GLYCOL 3350 17 G PO PACK: 102.0000 g | PACK | Freq: Once | ORAL | 0 refills | Status: AC

## 2016-08-29 NOTE — ED Triage Notes (Signed)
Pt states he has not had a bowel movement in 2 weeks, but states his inguinal hernia is betting bigger. Pt states he is passing a lot of gas.

## 2016-08-30 ENCOUNTER — Encounter: Payer: Self-pay | Admitting: Physician Assistant

## 2016-08-30 NOTE — ED Provider Notes (Signed)
[facility] DEPT Provider Note   CSN: [id number] Arrival date & time: 08/29/16  1945     History   Chief Complaint Chief Complaint  Patient presents with  . Constipation    HPI Tristan Cook is a 56 y.o. male.   Constipation   This is a new problem. The current episode started more than 1 week ago. Pertinent negatives include no abdominal pain, no flatus and no dysuria. There is no fiber in the patient's diet. He does not exercise regularly. There has been adequate water intake. He has tried nothing for the symptoms. The treatment provided no relief. Risk factors include a recent illness.    Past Medical History:  Diagnosis Date  . Abdominal pain   . Adenomatous colon polyp 2008  . Arthritis   . Back pain   . Bilateral lumbar radiculopathy   . Chronic back pain   . Constipation   . Depression   . GERD (gastroesophageal reflux disease)   . Gout   . Hemorrhoids   . High cholesterol   . Hypertension   . Neck pain   . Neuropathy of both feet   . Numbness    rectal  . Numbness of legs   . Substance abuse    h/o excessive alcohol use; pt says he limits use to one to 2 beers daily he currently    Patient Active Problem List   Diagnosis Date Noted  . History of colonic polyps 06/14/2016  . Elevated blood pressure reading 06/14/2016  . Hemorrhoids 09/20/2015  . Depression 08/03/2015  . Back pain with radiation 06/15/2015  . Insomnia 06/03/2015  . Hyperlipidemia LDL goal <130 04/25/2015  . 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 Fatigue 03/29/2015  . Allergic rhinitis 02/10/2015  . Esophageal stricture 10/22/2014  . Overweight (BMI 25.0-29.9) 10/06/2013  . GERD (gastroesophageal reflux disease) 04/23/2012  . Hemorrhoids, internal 11/02/2011  . Essential hypertension 07/25/2007  . Neck pain on left side 07/25/2007    Past Surgical History:  Procedure Laterality Date  . COLONOSCOPY  10/09/2006   3 mm pedunculated sigmoid colon polyp removed/8-mm sessile  hepatic flexure polyp (tubular villous adenoma) removed/6-mm  descending colon polyp removed/small internal hemorrhoids  . COLONOSCOPY  02/28/2011   [id number]:[id number], multiple in the rectum/Internal hemorrhoids, MODERATE-CAUSING RECTAL BLEEDING  . COLONOSCOPY N/A 07/05/2016   Procedure: COLONOSCOPY;  Surgeon: [name], MD;  Location: [facility];  Service: Endoscopy;  Laterality: N/A;  11:15am  . 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 Mild erosive gastrtitis most likely due to indomethacin   . FINGER SURGERY Right    4th and 5th digit  . FLEXIBLE SIGMOIDOSCOPY  01/02/2012   SLF: 1. the colonic mucosa appeared normal in the sigmoid colon 2. Large internal hemorrhoids: cause for rectal bleeding/pain . s/p banding X 3   . HAND SURGERY    . SAVORY DILATION N/A 07/21/2014   Procedure: SAVORY DILATION;  Surgeon: [name], MD;  Location: [facility];  Service: Endoscopy;  Laterality: N/A;       Home Medications    Prior to Admission medications   Medication Sig Start Date End Date Taking? Authorizing Provider  aspirin 81 MG tablet Take 1 tablet (81 mg total) by mouth daily. 10/06/13  Yes Fayrene Helper, MD  B Complex Vitamins (VITAMIN B COMPLEX) TABS Take 1 tablet by mouth daily.    Yes [provider]  bisacodyl (DULCOLAX) 10 MG suppository Place 10  mg rectally as needed for mild constipation or moderate constipation.   Yes [provider]  Calcium Carbonate Antacid (ANTACID PO) Take 1 tablet by mouth daily.   Yes [provider]  cholecalciferol (VITAMIN D) 1000 units tablet Take 1,000 Units by mouth daily.   Yes [provider]  diphenhydrAMINE (BENADRYL) 25 MG tablet Take 12.5 mg by mouth daily as needed for allergies.   Yes [provider]  FLUoxetine (PROZAC) 20 MG tablet Take 1 tablet (20 mg total) by mouth daily. 02/29/16  Yes Soyla Dryer, PA-C  lisinopril  (PRINIVIL,ZESTRIL) 40 MG tablet Take 1 tablet (40 mg total) by mouth daily. 05/15/16  Yes Soyla Dryer, PA-C  mineral oil enema Place 1 enema rectally once.   Yes [provider]  testosterone cypionate (DEPOTESTOSTERONE CYPIONATE) 200 MG/ML injection Inject 100 mg into the muscle 2 (two) times a week. 05/04/16  Yes [provider]  traZODone (DESYREL) 100 MG tablet Take 150 mg by mouth at bedtime.   Yes [provider]    Family History Family History  Problem Relation Age of Onset  . Hypertension Mother   . Hypertension Father   . Stroke Father   . Hypertension Sister   . Hypertension Brother   . Cancer Brother   . Cancer Brother        throat cancer  . Hypertension Brother   . Stroke Brother        2x  . Colon cancer Neg Hx     Social History Social History  Substance Use Topics  . Smoking status: Former Smoker    Packs/day: 1.50    Years: 33.00    Types: Cigarettes    Quit date: 03/13/2004  . Smokeless tobacco: Current User    Types: Snuff     Comment: Dips every once in a while  . Alcohol use No     Comment: drinks 6 pack of beer on weekends     Allergies   Benadryl [diphenhydramine hcl (sleep)]   Review of Systems Review of Systems  Gastrointestinal: Positive for constipation. Negative for abdominal pain and flatus.  Genitourinary: Negative for dysuria.  All other systems reviewed and are negative.    Physical Exam Updated Vital Signs BP 125/74   Pulse 98   Temp 98.5 F (36.9 C)   Resp 18   Ht 6\' 2"  (1.88 m)   Wt 79.4 kg (175 lb)   SpO2 96%   BMI 22.47 kg/m   Physical Exam  Constitutional: He is oriented to person, place, and time. He appears well-developed and well-nourished.  HENT:  Head: Normocephalic and atraumatic.  Eyes: Conjunctivae and EOM are normal.  Neck: Normal range of motion.  Cardiovascular: Normal rate.  Exam reveals no friction rub.   No murmur heard. Pulmonary/Chest: Effort normal. No respiratory  distress.  Abdominal: Soft. He exhibits no distension and no mass. There is no tenderness. There is no rebound and no guarding. No hernia.  Genitourinary: Right testis shows no mass. Left testis shows no mass.  Musculoskeletal: Normal range of motion. He exhibits no edema or deformity.  Neurological: He is alert and oriented to person, place, and time. No cranial nerve deficit. Coordination normal.  Skin: Skin is warm and dry.  Nursing note and vitals reviewed.    ED Treatments / Results  Labs (all labs ordered are listed, but only abnormal results are displayed) Labs Reviewed - No data to display  EKG  EKG Interpretation None  Radiology No results found.  Procedures Procedures (including critical care time)  Medications Ordered in ED Medications - No data to display   Initial Impression / Assessment and Plan / ED Course  I have reviewed the triage vital signs and the nursing notes.  Pertinent labs & imaging results that were available during my care of the patient were reviewed by me and considered in my medical decision making (see chart for details).     No obvious hernia on exam. No impaction. Has BM's just harder and smaller than normal. Has tried colace and suppository without relief.  Doubt hernia. Doubt obstruction with n/v and still passing gas. Likely just constipation. He will try bowel cleanout tomorrow.   Final Clinical Impressions(s) / ED Diagnoses   Final diagnoses:  Constipation, unspecified constipation type    New Prescriptions Discharge Medication List as of 08/29/2016  9:35 PM    START taking these medications   Details  polyethylene glycol (MIRALAX / GLYCOLAX) packet Take 102 g by mouth once. Mix into 32 oz gatorade and drink 4-6 ounces every 2 hours until diarrhea. Drink plenty of water at same time., Starting Tue 08/29/2016, Print         Melburn Treiber, Corene Cornea, MD 08/30/16 [phone]

## 2016-09-01 ENCOUNTER — Ambulatory Visit (INDEPENDENT_AMBULATORY_CARE_PROVIDER_SITE_OTHER): Payer: Self-pay | Admitting: Neurology

## 2016-09-01 ENCOUNTER — Encounter: Payer: Self-pay | Admitting: Neurology

## 2016-09-01 VITALS — BP 123/82 | HR 90 | Ht 74.0 in | Wt 174.2 lb

## 2016-09-01 DIAGNOSIS — M5416 Radiculopathy, lumbar region: Secondary | ICD-10-CM

## 2016-09-01 DIAGNOSIS — R200 Anesthesia of skin: Secondary | ICD-10-CM

## 2016-09-01 DIAGNOSIS — R270 Ataxia, unspecified: Secondary | ICD-10-CM

## 2016-09-01 DIAGNOSIS — R202 Paresthesia of skin: Secondary | ICD-10-CM

## 2016-09-01 DIAGNOSIS — R531 Weakness: Secondary | ICD-10-CM

## 2016-09-01 DIAGNOSIS — H539 Unspecified visual disturbance: Secondary | ICD-10-CM

## 2016-09-01 MED ORDER — AMITRIPTYLINE HCL 10 MG PO TABS: 10.0000 mg | ORAL_TABLET | Freq: Every day | ORAL | 0 refills | Status: DC
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
## 2016-09-01 NOTE — Patient Instructions (Addendum)
Remember to drink plenty of fluid, eat healthy meals and do not skip any meals. Try to eat protein with a every meal and eat a healthy snack such as fruit or nuts in between meals. Try to keep a regular sleep-wake schedule and try to exercise daily, particularly in the form of walking, 20-30 minutes a day, if you can.   As far as your medications are concerned, I would like to suggest: Amitriptyline 10mg  at bedtime  As far as diagnostic testing: Needs EKG in 2 weeks, Labs, MRI brain and neck, EMG/NCS  I would like to see you back for emg/ncs, sooner if we need to. Please call us with any interim questions, concerns, problems, updates or refill requests.   Our phone number is [phone]. We also have an after hours call service for urgent matters and there is a physician on-call for urgent questions. For any emergencies you know to call 911 or go to the nearest emergency room  Amitriptyline tablets What is this medicine? AMITRIPTYLINE (a mee TRIP ti leen) is used to treat depression. This medicine may be used for other purposes; ask your health care provider or pharmacist if you have questions. COMMON BRAND NAME(S): Elavil, Vanatrip What should I tell my health care provider before I take this medicine? They need to know if you have any of these conditions: -an alcohol problem -asthma, difficulty breathing -bipolar disorder or schizophrenia -difficulty passing urine, prostate trouble -glaucoma -heart disease or previous heart attack -liver disease -over active thyroid -seizures -thoughts or plans of suicide, a previous suicide attempt, or family history of suicide attempt -an unusual or allergic reaction to amitriptyline, other medicines, foods, dyes, or preservatives -pregnant or trying to get pregnant -breast-feeding How should I use this medicine? Take this medicine by mouth with a drink of water. Follow the directions on the prescription label. You can take the tablets with or  without food. Take your medicine at regular intervals. Do not take it more often than directed. Do not stop taking this medicine suddenly except upon the advice of your doctor. Stopping this medicine too quickly may cause serious side effects or your condition may worsen. A special MedGuide will be given to you by the pharmacist with each prescription and refill. Be sure to read this information carefully each time. Talk to your pediatrician regarding the use of this medicine in children. Special care may be needed. Overdosage: If you think you have taken too much of this medicine contact a poison control center or emergency room at once. NOTE: This medicine is only for you. Do not share this medicine with others. What if I miss a dose? If you miss a dose, take it as soon as you can. If it is almost time for your next dose, take only that dose. Do not take double or extra doses. What may interact with this medicine? Do not take this medicine with any of the following medications: -arsenic trioxide -certain medicines used to regulate abnormal heartbeat or to treat other heart conditions -cisapride -droperidol -halofantrine -linezolid -MAOIs like Carbex, Eldepryl, Marplan, Nardil, and Parnate -methylene blue -other medicines for mental depression -phenothiazines like perphenazine, thioridazine and chlorpromazine -pimozide -probucol -procarbazine -sparfloxacin -St. John's Wort -ziprasidone This medicine may also interact with the following medications: -atropine and related drugs like hyoscyamine, scopolamine, tolterodine and others -barbiturate medicines for inducing sleep or treating seizures, like phenobarbital -cimetidine -disulfiram -ethchlorvynol -thyroid hormones such as levothyroxine This list may not describe all possible interactions. Give your health  care provider a list of all the medicines, herbs, non-prescription drugs, or dietary supplements you use. Also tell them if you  smoke, drink alcohol, or use illegal drugs. Some items may interact with your medicine. What should I watch for while using this medicine? Tell your doctor if your symptoms do not get better or if they get worse. Visit your doctor or health care professional for regular checks on your progress. Because it may take several weeks to see the full effects of this medicine, it is important to continue your treatment as prescribed by your doctor. Patients and their families should watch out for new or worsening thoughts of suicide or depression. Also watch out for sudden changes in feelings such as feeling anxious, agitated, panicky, irritable, hostile, aggressive, impulsive, severely restless, overly excited and hyperactive, or not being able to sleep. If this happens, especially at the beginning of treatment or after a change in dose, call your health care professional. Dennis Bast may get drowsy or dizzy. Do not drive, use machinery, or do anything that needs mental alertness until you know how this medicine affects you. Do not stand or sit up quickly, especially if you are an older patient. This reduces the risk of dizzy or fainting spells. Alcohol may interfere with the effect of this medicine. Avoid alcoholic drinks. Do not treat yourself for coughs, colds, or allergies without asking your doctor or health care professional for advice. Some ingredients can increase possible side effects. Your mouth may get dry. Chewing sugarless gum or sucking hard candy, and drinking plenty of water will help. Contact your doctor if the problem does not go away or is severe. This medicine may cause dry eyes and blurred vision. If you wear contact lenses you may feel some discomfort. Lubricating drops may help. See your eye doctor if the problem does not go away or is severe. This medicine can cause constipation. Try to have a bowel movement at least every 2 to 3 days. If you do not have a bowel movement for 3 days, call your doctor  or health care professional. This medicine can make you more sensitive to the sun. Keep out of the sun. If you cannot avoid being in the sun, wear protective clothing and use sunscreen. Do not use sun lamps or tanning beds/booths. What side effects may I notice from receiving this medicine? Side effects that you should report to your doctor or health care professional as soon as possible: -allergic reactions like skin rash, itching or hives, swelling of the face, lips, or tongue -anxious -breathing problems -changes in vision -confusion -elevated mood, decreased need for sleep, racing thoughts, impulsive behavior -eye pain -fast, irregular heartbeat -feeling faint or lightheaded, falls -feeling agitated, angry, or irritable -fever with increased sweating -hallucination, loss of contact with reality -seizures -stiff muscles -suicidal thoughts or other mood changes -tingling, pain, or numbness in the feet or hands -trouble passing urine or change in the amount of urine -trouble sleeping -unusually weak or tired -vomiting -yellowing of the eyes or skin Side effects that usually do not require medical attention (report to your doctor or health care professional if they continue or are bothersome): -change in sex drive or performance -change in appetite or weight -constipation -dizziness -dry mouth -nausea -tired -tremors -upset stomach This list may not describe all possible side effects. Call your doctor for medical advice about side effects. You may report side effects to FDA at 1-800-FDA-1088. Where should I keep my medicine? Keep out of the  reach of children. Store at room temperature between 20 and 25 degrees C (68 and 77 degrees F). Throw away any unused medicine after the expiration date. NOTE: This sheet is a summary. It may not cover all possible information. If you have questions about this medicine, talk to your doctor, pharmacist, or health care provider.  2018  Elsevier/Gold Standard (2015-07-30 12:14:15)

## 2016-09-05 ENCOUNTER — Ambulatory Visit: Payer: Self-pay | Admitting: Family Medicine

## 2016-09-12 ENCOUNTER — Ambulatory Visit: Payer: Self-pay | Admitting: Physician Assistant

## 2016-09-12 ENCOUNTER — Encounter: Payer: Self-pay | Admitting: Physician Assistant

## 2016-09-12 VITALS — BP 122/86 | HR 81 | Temp 97.7°F | Ht 74.0 in | Wt 177.0 lb

## 2016-09-12 DIAGNOSIS — F32A Depression, unspecified: Secondary | ICD-10-CM

## 2016-09-12 DIAGNOSIS — I10 Essential (primary) hypertension: Secondary | ICD-10-CM

## 2016-09-12 DIAGNOSIS — F329 Major depressive disorder, single episode, unspecified: Secondary | ICD-10-CM

## 2016-09-12 DIAGNOSIS — G629 Polyneuropathy, unspecified: Secondary | ICD-10-CM

## 2016-09-12 MED ORDER — FLUOXETINE HCL 20 MG PO TABS: 20.0000 mg | ORAL_TABLET | Freq: Every day | ORAL | 1 refills | Status: DC

## 2016-09-12 MED ORDER — LISINOPRIL 20 MG PO TABS: 20.0000 mg | ORAL_TABLET | Freq: Every day | ORAL | 2 refills | Status: DC

## 2016-09-12 NOTE — Progress Notes (Signed)
BP 122/86   Pulse 81   Temp 97.7 F (36.5 C)   Ht 6\' 2"  (1.88 m)   Wt 177 lb (80.3 kg)   SpO2 99%   BMI 22.73 kg/m    Subjective:    Patient ID: [name], male    DOB: 27-Sep-1960, 56 y.o.   MRN: [id number]  HPI: Tristan Cook is a 56 y.o. male presenting on 09/12/2016 for Follow-up   HPI   Pt [facility]  He got his colonoscopy done  He has been seeing the neurologist.  He has appts for MRI and EMG  Pt says he started cutting his lisinopril in half b/c his bp got too low. Highest 117, lowest 93.  Pt states mood is doing well.    Pt requests something for pain.  Pt states gabapentin didn't help him in the past. He doesn't take nsaids due to GI issues  Relevant past medical, surgical, family and social history reviewed and updated as indicated. Interim medical history since our last visit reviewed. Allergies and medications reviewed and updated.    Current Outpatient Prescriptions:  .  amitriptyline (ELAVIL) 10 MG tablet, Take 1 tablet (10 mg total) by mouth at bedtime., Disp: 30 tablet, Rfl: 0 .  aspirin 81 MG tablet, Take 1 tablet (81 mg total) by mouth daily., Disp: 30 tablet, Rfl:  .  B Complex Vitamins (VITAMIN B COMPLEX) TABS, Take 1 tablet by mouth daily. , Disp: , Rfl:  .  cholecalciferol (VITAMIN D) 1000 units tablet, Take 1,000 Units by mouth daily., Disp: , Rfl:  .  diphenhydrAMINE (BENADRYL) 25 MG tablet, Take 12.5 mg by mouth daily as needed for allergies., Disp: , Rfl:  .  docusate sodium (COLACE) 100 MG capsule, Take 100 mg by mouth 2 (two) times daily., Disp: , Rfl:  .  FLUoxetine (PROZAC) 20 MG tablet, Take 1 tablet (20 mg total) by mouth daily., Disp: 90 tablet, Rfl: 1 .  testosterone cypionate (DEPOTESTOSTERONE CYPIONATE) 200 MG/ML injection, Inject 100 mg into the muscle 2 (two) times a week., Disp: , Rfl: 3 .  traZODone (DESYREL) 100 MG tablet, Take 150 mg by mouth at bedtime., Disp: , Rfl:  .  lisinopril  (PRINIVIL,ZESTRIL) 20 MG tablet, Take 1 tablet (20 mg total) by mouth daily., Disp: 90 tablet, Rfl: 2  Review of Systems  Constitutional: Negative for appetite change, chills, diaphoresis, fatigue, fever and unexpected weight change.  HENT: Negative for congestion, drooling, ear pain, facial swelling, hearing loss, mouth sores, sneezing, sore throat, trouble swallowing and voice change.   Eyes: Positive for visual disturbance. Negative for pain, discharge, redness and itching.  Respiratory: Negative for cough, choking, shortness of breath and wheezing.   Cardiovascular: Negative for chest pain, palpitations and leg swelling.  Gastrointestinal: Negative for abdominal pain, blood in stool, constipation, diarrhea and vomiting.  Endocrine: Negative for cold intolerance, heat intolerance and polydipsia.  Genitourinary: Negative for decreased urine volume, dysuria and hematuria.  Musculoskeletal: Positive for arthralgias, back pain and gait problem.  Skin: Negative for rash.  Allergic/Immunologic: Negative for environmental allergies.  Neurological: Negative for seizures, syncope, light-headedness and headaches.  Hematological: Negative for adenopathy.  Psychiatric/Behavioral: Positive for dysphoric mood. Negative for agitation and suicidal ideas. The patient is not nervous/anxious.     Per HPI unless specifically indicated above     Objective:    BP 122/86   Pulse 81   Temp 97.7 F (36.5 C)   Ht 6\' 2"  (  1.88 m)   Wt 177 lb (80.3 kg)   SpO2 99%   BMI 22.73 kg/m   Wt Readings from Last 3 Encounters:  09/12/16 177 lb (80.3 kg)  09/01/16 174 lb 3.2 oz (79 kg)  08/29/16 175 lb (79.4 kg)    Physical Exam  Constitutional: He is oriented to person, place, and time. He appears well-developed and well-nourished.  HENT:  Head: Normocephalic and atraumatic.  Neck: Neck supple.  Cardiovascular: Normal rate and regular rhythm.   Pulmonary/Chest: Effort normal and breath sounds normal. He has  no wheezes.  Abdominal: Soft. Bowel sounds are normal. There is no hepatosplenomegaly. There is no tenderness.  Musculoskeletal: He exhibits no edema.  Lymphadenopathy:    He has no cervical adenopathy.  Neurological: He is alert and oriented to person, place, and time.  Skin: Skin is warm and dry.  Psychiatric: He has a normal mood and affect. His behavior is normal.  Vitals reviewed.       Assessment & Plan:   Encounter Diagnoses  Name Primary?  . Essential hypertension Yes  . Depression, unspecified depression type   . Neuropathy     -Pt to continue with specialist for neuropathy -Gave rx lisinopril 20mg  since this is what he has been using and his bp is good -Discussed with pt [facility] cannot rx narcotics and since gabapentin didn't help him any, there is little more to offer him (pt won't take nsaids) -pt to continue on prozac for depression.  Discussed with him to contact [facility] if needed to reschedule -told pt he has appt with dr Moshe Cipro on the calendar that he should call and cancel.  He says he would -follow up 3 months. RTO sooner prn

## 2016-09-12 NOTE — Patient Instructions (Signed)
Cancel appt with dr Moshe Cipro for 10/04/16

## 2016-09-23 ENCOUNTER — Ambulatory Visit
Admission: RE | Admit: 2016-09-23 | Discharge: 2016-09-23 | Disposition: A | Discharge status: Home / Self Care | Payer: Self-pay | Source: Ambulatory Visit | Attending: Neurology | Admitting: Neurology

## 2016-09-23 DIAGNOSIS — R200 Anesthesia of skin: Secondary | ICD-10-CM

## 2016-09-23 DIAGNOSIS — R531 Weakness: Secondary | ICD-10-CM

## 2016-09-23 DIAGNOSIS — R270 Ataxia, unspecified: Secondary | ICD-10-CM

## 2016-09-23 DIAGNOSIS — H539 Unspecified visual disturbance: Secondary | ICD-10-CM

## 2016-09-23 DIAGNOSIS — R202 Paresthesia of skin: Secondary | ICD-10-CM

## 2016-09-23 MED ORDER — GADOBENATE DIMEGLUMINE 529 MG/ML IV SOLN
17.0000 mL | Freq: Once | INTRAVENOUS | Status: AC | PRN
  Administered 2016-09-23: 17 mL via INTRAVENOUS

## 2016-09-24 ENCOUNTER — Encounter (HOSPITAL_COMMUNITY): Payer: Self-pay

## 2016-09-24 ENCOUNTER — Emergency Department (HOSPITAL_COMMUNITY)
Admission: EM | Admit: 2016-09-24 | Discharge: 2016-09-24 | Disposition: A | Discharge status: Home / Self Care | Payer: No Typology Code available for payment source | Attending: Emergency Medicine | Admitting: Emergency Medicine

## 2016-09-24 DIAGNOSIS — R000 Tachycardia, unspecified: Secondary | ICD-10-CM

## 2016-09-24 DIAGNOSIS — F17220 Nicotine dependence, chewing tobacco, uncomplicated: Secondary | ICD-10-CM | POA: Insufficient documentation

## 2016-09-24 DIAGNOSIS — I10 Essential (primary) hypertension: Secondary | ICD-10-CM | POA: Insufficient documentation

## 2016-09-24 DIAGNOSIS — Z7982 Long term (current) use of aspirin: Secondary | ICD-10-CM | POA: Insufficient documentation

## 2016-09-24 DIAGNOSIS — Z79899 Other long term (current) drug therapy: Secondary | ICD-10-CM | POA: Insufficient documentation

## 2016-09-24 DIAGNOSIS — R42 Dizziness and giddiness: Secondary | ICD-10-CM

## 2016-09-24 LAB — CBC WITH DIFFERENTIAL/PLATELET
BASOS ABS: 0 10*3/uL (ref 0.0–0.1)
BASOS PCT: 0 %
EOS ABS: 0 10*3/uL (ref 0.0–0.7)
Eosinophils Relative: 0 %
HCT: 43.3 % (ref 39.0–52.0)
HEMOGLOBIN: 15.5 g/dL (ref 13.0–17.0)
Lymphocytes Relative: 13 %
Lymphs Abs: 1.3 10*3/uL (ref 0.7–4.0)
MCH: 33.2 pg (ref 26.0–34.0)
MCHC: 35.8 g/dL (ref 30.0–36.0)
MCV: 92.7 fL (ref 78.0–100.0)
MONOS PCT: 12 %
Monocytes Absolute: 1.2 10*3/uL — ABNORMAL HIGH (ref 0.1–1.0)
NEUTROS PCT: 75 %
Neutro Abs: 7.3 10*3/uL (ref 1.7–7.7)
Platelets: 292 10*3/uL (ref 150–400)
RBC: 4.67 MIL/uL (ref 4.22–5.81)
RDW: 13.2 % (ref 11.5–15.5)
WBC: 9.9 10*3/uL (ref 4.0–10.5)

## 2016-09-24 LAB — COMPREHENSIVE METABOLIC PANEL
ALBUMIN: 4.3 g/dL (ref 3.5–5.0)
ALK PHOS: 76 U/L (ref 38–126)
ALT: 23 U/L (ref 17–63)
ANION GAP: 15 (ref 5–15)
AST: 42 U/L — AB (ref 15–41)
BUN: 20 mg/dL (ref 6–20)
CALCIUM: 10.6 mg/dL — AB (ref 8.9–10.3)
CO2: 24 mmol/L (ref 22–32)
Chloride: 95 mmol/L — ABNORMAL LOW (ref 101–111)
Creatinine, Ser: 1.41 mg/dL — ABNORMAL HIGH (ref 0.61–1.24)
GFR calc Af Amer: >60 mL/min (ref >60)
GFR calc non Af Amer: 55 mL/min — ABNORMAL LOW (ref >60)
GLUCOSE: 124 mg/dL — AB (ref 65–99)
Potassium: 3.8 mmol/L (ref 3.5–5.1)
SODIUM: 134 mmol/L — AB (ref 135–145)
Total Bilirubin: 3.1 mg/dL — ABNORMAL HIGH (ref 0.3–1.2)
Total Protein: 7.8 g/dL (ref 6.5–8.1)

## 2016-09-24 LAB — TROPONIN I: Troponin I: <0.03 ng/mL (ref <0.03)

## 2016-09-24 LAB — TSH: TSH: 1.411 u[IU]/mL (ref 0.350–4.500)

## 2016-09-24 MED ORDER — SODIUM CHLORIDE 0.9 % IV BOLUS (SEPSIS)
1000.0000 mL | Freq: Once | INTRAVENOUS | Status: AC
  Administered 2016-09-24: 1000 mL via INTRAVENOUS

## 2016-09-24 MED ORDER — MECLIZINE HCL 12.5 MG PO TABS
25.0000 mg | ORAL_TABLET | Freq: Once | ORAL | Status: AC
  Administered 2016-09-24: 25 mg via ORAL
  Filled 2016-09-24: qty 2

## 2016-09-24 MED ORDER — MECLIZINE HCL 25 MG PO TABS: 25.0000 mg | ORAL_TABLET | Freq: Two times a day (BID) | ORAL | 0 refills | Status: DC | PRN

## 2016-09-24 NOTE — Discharge Instructions (Signed)
Rest, increase fluids, scription for dizziness medicine. Return if worse.

## 2016-09-24 NOTE — ED Provider Notes (Signed)
[facility] DEPT Provider Note   CSN: [id number] Arrival date & time: 09/24/16  1021     History   Chief Complaint Chief Complaint  Patient presents with  . Dizziness    HPI Tristan Cook is a 56 y.o. male.  Patient reports dizziness/light headedness particularly when standing for the past 24 hours. He has peripheral neuropathy which is an old finding. He feels dehydrated. No chest pain, dyspnea, gross neurological deficits. Severity of symptoms is moderate.      Past Medical History:  Diagnosis Date  . Abdominal pain   . Adenomatous colon polyp 2008  . Arthritis   . Back pain   . Bilateral lumbar radiculopathy   . Chronic back pain   . Constipation   . Depression   . GERD (gastroesophageal reflux disease)   . Gout   . Hemorrhoids   . High cholesterol   . Hypertension   . Neck pain   . Neuropathy of both feet   . Numbness    rectal  . Numbness of legs   . Substance abuse    h/o excessive alcohol use; pt says he limits use to one to 2 beers daily he currently    Patient Active Problem List   Diagnosis Date Noted  . History of colonic polyps 06/14/2016  . Elevated blood pressure reading 06/14/2016  . Hemorrhoids 09/20/2015  . Depression 08/03/2015  . Back pain with radiation 06/15/2015  . Insomnia 06/03/2015  . Hyperlipidemia LDL goal <130 04/25/2015  . 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 Fatigue 03/29/2015  . Allergic rhinitis 02/10/2015  . Esophageal stricture 10/22/2014  . Overweight (BMI 25.0-29.9) 10/06/2013  . GERD (gastroesophageal reflux disease) 04/23/2012  . Hemorrhoids, internal 11/02/2011  . Essential hypertension 07/25/2007  . Neck pain on left side 07/25/2007    Past Surgical History:  Procedure Laterality Date  . COLONOSCOPY  10/09/2006   3 mm pedunculated sigmoid colon polyp removed/8-mm sessile hepatic flexure polyp (tubular villous adenoma) removed/6-mm  descending colon polyp removed/small internal hemorrhoids  .  COLONOSCOPY  02/28/2011   EPP:IRJJOA, multiple in the rectum/Internal hemorrhoids, MODERATE-CAUSING RECTAL BLEEDING  . COLONOSCOPY N/A 07/05/2016   Procedure: COLONOSCOPY;  Surgeon: [name], MD;  Location: [facility];  Service: Endoscopy;  Laterality: N/A;  11:15am  . 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 Mild erosive gastrtitis most likely due to indomethacin   . FINGER SURGERY Right    4th and 5th digit  . FLEXIBLE SIGMOIDOSCOPY  01/02/2012   SLF: 1. the colonic mucosa appeared normal in the sigmoid colon 2. Large internal hemorrhoids: cause for rectal bleeding/pain . s/p banding X 3   . HAND SURGERY    . SAVORY DILATION N/A 07/21/2014   Procedure: SAVORY DILATION;  Surgeon: [name], MD;  Location: [facility];  Service: Endoscopy;  Laterality: N/A;       Home Medications    Prior to Admission medications   Medication Sig Start Date End Date Taking? Authorizing Provider  amitriptyline (ELAVIL) 10 MG tablet Take 1 tablet (10 mg total) by mouth at bedtime. 09/01/16  Yes Melvenia Beam, MD  aspirin 81 MG tablet Take 1 tablet (81 mg total) by mouth daily. 10/06/13  Yes Fayrene Helper, MD  B Complex Vitamins (VITAMIN B COMPLEX) TABS Take 1 tablet by mouth daily.    Yes [provider]  cholecalciferol (VITAMIN D) 1000 units tablet Take 1,000 Units by mouth daily.  Yes [provider]  diphenhydrAMINE (BENADRYL) 25 MG tablet Take 12.5 mg by mouth daily as needed for allergies.   Yes [provider]  docusate sodium (COLACE) 100 MG capsule Take 100 mg by mouth 2 (two) times daily.   Yes [provider]  FLUoxetine (PROZAC) 20 MG tablet Take 1 tablet (20 mg total) by mouth daily. Patient taking differently: Take 20 mg by mouth daily. Not started yet 09/12/16  Yes Soyla Dryer, PA-C  lisinopril (PRINIVIL,ZESTRIL) 20 MG tablet Take 1 tablet (20 mg total) by mouth daily.  09/12/16  Yes Soyla Dryer, PA-C  testosterone cypionate (DEPOTESTOSTERONE CYPIONATE) 200 MG/ML injection Inject 100 mg into the muscle 2 (two) times a week. 05/04/16  Yes [provider]  traZODone (DESYREL) 100 MG tablet Take 150 mg by mouth at bedtime.   Yes [provider]  meclizine (ANTIVERT) 25 MG tablet Take 1 tablet (25 mg total) by mouth 2 (two) times daily as needed for dizziness. 09/24/16   Nat Christen, MD    Family History Family History  Problem Relation Age of Onset  . Hypertension Mother   . Hypertension Father   . Stroke Father   . Hypertension Sister   . Hypertension Brother   . Cancer Brother   . Cancer Brother        throat cancer  . Hypertension Brother   . Diabetes Maternal Grandmother   . Colon cancer Neg Hx     Social History Social History  Substance Use Topics  . Smoking status: Former Smoker    Packs/day: 1.50    Years: 33.00    Types: Cigarettes    Quit date: 08/16/2004  . Smokeless tobacco: Current User    Types: Snuff     Comment: Dips every once in a while  . Alcohol use No     Comment: drinks 6 pack of beer on weekends, 09/01/16 stopped ETOH     Allergies   Benadryl [diphenhydramine hcl (sleep)]   Review of Systems Review of Systems  All other systems reviewed and are negative.    Physical Exam Updated Vital Signs BP 116/86   Pulse (!) 113   Temp 97.6 F (36.4 C) (Oral)   Resp 16   Ht 6\' 2"  (1.88 m)   Wt 81.6 kg (180 lb)   SpO2 98%   BMI 23.11 kg/m   Physical Exam  Constitutional: He is oriented to person, place, and time. He appears well-developed and well-nourished.  HENT:  Head: Normocephalic and atraumatic.  Eyes: Conjunctivae are normal.  Neck: Neck supple.  Cardiovascular: Regular rhythm.   tachycardic  Pulmonary/Chest: Effort normal and breath sounds normal.  Abdominal: Soft. Bowel sounds are normal.  Musculoskeletal: Normal range of motion.  Neurological: He is alert and oriented to person,  place, and time.  Skin: Skin is warm and dry.  Psychiatric: He has a normal mood and affect. His behavior is normal.  Nursing note and vitals reviewed.    ED Treatments / Results  Labs (all labs ordered are listed, but only abnormal results are displayed) Labs Reviewed  CBC WITH DIFFERENTIAL/PLATELET - Abnormal; Notable for the following:       Result Value   Monocytes Absolute 1.2 (*)    All other components within normal limits  COMPREHENSIVE METABOLIC PANEL - Abnormal; Notable for the following:    Sodium 134 (*)    Chloride 95 (*)    Glucose, Bld 124 (*)    Creatinine, Ser 1.41 (*)  Calcium 10.6 (*)    AST 42 (*)    Total Bilirubin 3.1 (*)    GFR calc non Af Amer 55 (*)    All other components within normal limits  TROPONIN I  TSH  RAPID URINE DRUG SCREEN, HOSP PERFORMED    EKG  EKG Interpretation  Date/Time:  Sunday September 24 2016 10:32:00 EDT Ventricular Rate:  128 PR Interval:    QRS Duration: 157 QT Interval:  343 QTC Calculation: 495 R Axis:   19 Text Interpretation:  Sinus tachycardia Nonspecific intraventricular conduction delay Probable anteroseptal infarct, old Minimal ST depression, inferior leads Confirmed by Nat Christen [phone]) on 09/24/2016 11:52:09 AM       Radiology Mr Jeri Cos Wo Contrast  Result Date: 09/24/2016  [facility] [address] [place], [place] [address] [phone] NEUROIMAGING REPORT STUDY DATE: 09/23/2016 PATIENT NAME: ALVIA TORY DOB: Sep 24, 1960 MRN: [id number] EXAM: MRI Brain with and without contrast ORDERING CLINICIAN: Sarina Ill M.D. CLINICAL HISTORY: 56 year old man with numbness, weakness, ataxia and vision changes COMPARISON FILMS: None TECHNIQUE:MRI of the brain with and without contrast was obtained utilizing 5 mm axial slices with T1, T2, T2 flair, SWI and diffusion weighted views.  T1 sagittal, T2 coronal and postcontrast views in the axial and coronal plane were obtained. CONTRAST: 17 ml  [facility] IMAGING SITE: [facility], [place]. FINDINGS: On sagittal images, the spinal cord is imaged caudally to C3 and is normal in caliber.   The contents of the posterior fossa are of normal size and position.   The pituitary gland and optic chiasm appear normal.    There is minimal cortical atrophy with proportional increase in size of the third ventricle.   There are no abnormal extra-axial collections of fluid.  The cerebellum appears normal.  There are small T2/FLAIR hyperintense focus in the right pons and left cerebral peduncle. The deep gray matter appears normal. There is a chronic lacunar infarction adjacent to the basal ganglia on the right.    In the hemispheres, there are some scattered T2/FLAIR hyperintense foci consistent with chronic microvascular ischemic change. None of these appear to be acute.  Diffusion weighted images are normal.  Susceptibility weighted images are normal.   The orbits appear normal.   The VIIth/VIIIth nerve complex appears normal.  The mastoid air cells appear normal.  The paranasal sinuses appear normal.  Flow voids are identified within the major intracerebral arteries.  After the infusion of contrast material, a normal enhancement pattern is noted.    This MRI of the brain with and without contrast shows the following: 1.   There is a chronic lacunar infarction adjacent to the basal ganglia on the right. There are also some scattered T2/FLAIR hyperintense foci in the hemispheres, right pons and left midbrain consistent with chronic microvascular ischemic change. 2.   There is a normal enhancement pattern and there are no acute findings. INTERPRETING PHYSICIAN: Richard A. Felecia Shelling, MD, PhD, FAAN Certified in  Neuroimaging by [employer]   Mr Cervical Spine Wo Contrast  Result Date: 09/24/2016  [facility] [address] [place], [place] [address] [phone] NEUROIMAGING REPORT STUDY DATE: 09/23/2016  PATIENT NAME: Tristan Cook DOB: [birth year] MRN: [id number] EXAM: MRI of the cervical spine ORDERING CLINICIAN: Sarina Ill M.D. CLINICAL HISTORY: 56 year old man with numbness and weakness and gait ataxia COMPARISON FILMS: None TECHNIQUE: MRI of the cervical spine was obtained utilizing 3 mm sagittal slices from the  posterior fossa down to the T3 level with T1, T2 and inversion recovery views. In addition 4 mm axial slices from H8-5 down to T1-2 level were included with T2 and gradient echo views. CONTRAST: None IMAGING SITE: [facility], [place], [place], [place] FINDINGS: :  On sagittal images, the spine is imaged from above the cervicomedullary junction to T3.   The spinal cord is of normal caliber and signal.   The vertebral bodies are normally aligned.   The vertebral bodies have normal signal.  The discs and interspaces were further evaluated on axial views from C2 to T2 as follows: C2-C3: There is minimal central disc bulging that does not lead to any nerve root compression. C3-C4: There is mild right greater than left uncovertebral spurring and disc bulging. There is mild to moderate right and mild left foraminal narrowing. There is no nerve root compression. C4-C5:  There is right greater than left uncovertebral spurring, mild right facet hypertrophy and disc bulging.   This causes moderate foraminal narrowing, more on the right. Although there is no definite nerve root compression, there is encroachment upon both of the C5 nerve roots. C5-C6: There is moderate right greater than left facet hypertrophy, uncovertebral spurring bilaterally and disc bulging.   This causes moderately severe foraminal narrowing on the right and moderate foraminal narrowing on the left. There is potential for right C6 nerve root compression. The central canal is narrowed but not enough to be considered spinal stenosis. C6-C7:  There is disc protrusion and mild uncovertebral spurring and minimal facet  hypertrophy. These combine to cause mild spinal stenosis and mild to moderate bilateral foraminal narrowing. There does not appear to be nerve root compression. C7-T1: There is disc protrusion and mild uncovertebral spurring combining to cause mild spinal stenosis and mild bilateral foraminal narrowing. There does not appear to be any nerve root compression. T1-T2: The disc and interspace appear normal.    This MRI of the cervical spine without contrast shows the following: 1.    There are multilevel degenerative changes as detailed above. These combined to cause moderate foraminal narrowing at C4-C5 with there is some encroachment upon the C5 nerve roots. There is moderately severe foraminal narrowing on the right at C5-C6 with potential for right C6 nerve root compression. 2.   There is mild spinal stenosis at C6-C7 and C7-T1. There does not appear to be nerve root compression at these levels. 3.    The spinal cord appears normal. INTERPRETING PHYSICIAN: Richard A. Felecia Shelling, MD, PhD, FAAN Certified in  Neuroimaging by [employer]    Procedures Procedures (including critical care time)  Medications Ordered in ED Medications  sodium chloride 0.9 % bolus 1,000 mL (1,000 mLs Intravenous New Bag/Given 09/24/16 1128)  sodium chloride 0.9 % bolus 1,000 mL (1,000 mLs Intravenous New Bag/Given 09/24/16 1054)  meclizine (ANTIVERT) tablet 25 mg (25 mg Oral Given 09/24/16 1103)     Initial Impression / Assessment and Plan / ED Course  I have reviewed the triage vital signs and the nursing notes.  Pertinent labs & imaging results that were available during my care of the patient were reviewed by me and considered in my medical decision making (see chart for details).     Patient presents with sense of lightheadedness and tachycardia. He feels much better after 2 L of IV fluids. His pulse has reduced to approximately 100. MRI of brain and cervical spine reviewed from 09/23/2016. No acute  anomalies were noted. Screening  labs were acceptable. Discharge medications Antivert 25 mg. If symptoms persist, would consider reducing his blood pressure medication.  Final Clinical Impressions(s) / ED Diagnoses   Final diagnoses:  Dizziness  Tachycardia    New Prescriptions New Prescriptions   MECLIZINE (ANTIVERT) 25 MG TABLET    Take 1 tablet (25 mg total) by mouth 2 (two) times daily as needed for dizziness.     Nat Christen, MD 09/24/16 [phone]

## 2016-09-24 NOTE — ED Triage Notes (Signed)
Pt reports that he gets dizzy and off balanced when he stands up. This began yesterday. Denies HA. Also reports numbness in his legs when standing, but attributes this to peripheral neuropathy. Pt reports he fell Friday and believes it may be related to dizziness

## 2016-09-25 ENCOUNTER — Encounter: Payer: Self-pay | Admitting: Physician Assistant

## 2016-09-25 NOTE — Progress Notes (Signed)
Pt called asking if he needed to continue taking his blood pressure medication. Pt states his blood pressure drops when he stands up and has been to [facility].  Spoke with PA and she advises pt to continue taking bp med. [facility] diagnosed patient as dehydrated. Pt was informed.  Pt states that today he blacked out on his kitchen floor. Pt has been scheduled for tomorrow, 09-26-16 at 11:45am

## 2016-09-26 ENCOUNTER — Encounter: Payer: Self-pay | Admitting: Physician Assistant

## 2016-09-26 ENCOUNTER — Ambulatory Visit: Payer: Self-pay | Admitting: Physician Assistant

## 2016-09-26 VITALS — BP 130/80 | HR 110 | Temp 97.5°F | Ht 74.0 in | Wt 170.0 lb

## 2016-09-26 DIAGNOSIS — I10 Essential (primary) hypertension: Secondary | ICD-10-CM

## 2016-09-26 DIAGNOSIS — R42 Dizziness and giddiness: Secondary | ICD-10-CM

## 2016-09-26 DIAGNOSIS — E860 Dehydration: Secondary | ICD-10-CM

## 2016-09-26 NOTE — Progress Notes (Signed)
BP 130/80 (BP Location: Left Arm, Patient Position: Sitting, Cuff Size: Normal)   Pulse (!) 110   Temp (!) 97.5 F (36.4 C)   Ht 6\' 2"  (1.88 m)   Wt 170 lb (77.1 kg)   SpO2 99%   BMI 21.83 kg/m    Subjective:    Patient ID: Tristan Cook, male    DOB: 1960/12/05, 56 y.o.   MRN: [id number]  HPI: Tristan Cook is a 56 y.o. male presenting on 09/26/2016 for Dizziness (pt states he fell out twice yesterday. pt took amitriptylin last night, but states he will no longer take it due to belief that it is the reason for his symptoms)   HPI  Pt complains of dizziness.  He says he feels just like he did when he went to the ER 2 days ago.  That note and labs/imaging reviewed.  Pt felt better until he took the amitryptylline.   Pt says he has been drinking lots of fluids and has not been out in the heat much.  No vision changes, CP, HA, hemiplegia, weakness, LOC.   Pt uncertain of exactly what meds he is taking from his psychiatrist. He did not bring them with him today.   Relevant past medical, surgical, family and social history reviewed and updated as indicated. Interim medical history since our last visit reviewed. Allergies and medications reviewed and updated.   Current Outpatient Prescriptions:  .  amitriptyline (ELAVIL) 10 MG tablet, Take 1 tablet (10 mg total) by mouth at bedtime., Disp: 30 tablet, Rfl: 0 .  aspirin 81 MG tablet, Take 1 tablet (81 mg total) by mouth daily., Disp: 30 tablet, Rfl:  .  B Complex Vitamins (VITAMIN B COMPLEX) TABS, Take 1 tablet by mouth daily. , Disp: , Rfl:  .  cholecalciferol (VITAMIN D) 1000 units tablet, Take 1,000 Units by mouth daily., Disp: , Rfl:  .  diphenhydrAMINE (BENADRYL) 25 MG tablet, Take 12.5 mg by mouth daily as needed for allergies., Disp: , Rfl:  .  docusate sodium (COLACE) 100 MG capsule, Take 100 mg by mouth 2 (two) times daily., Disp: , Rfl:  .  lisinopril (PRINIVIL,ZESTRIL) 20 MG tablet, Take 1 tablet (20 mg total) by mouth  daily., Disp: 90 tablet, Rfl: 2 .  TEMAZEPAM PO, Take by mouth., Disp: , Rfl:  .  FLUoxetine (PROZAC) 20 MG tablet, Take 1 tablet (20 mg total) by mouth daily. (Patient not taking: Reported on 09/26/2016), Disp: 90 tablet, Rfl: 1 .  meclizine (ANTIVERT) 25 MG tablet, Take 1 tablet (25 mg total) by mouth 2 (two) times daily as needed for dizziness. (Patient not taking: Reported on 09/26/2016), Disp: 15 tablet, Rfl: 0 .  testosterone cypionate (DEPOTESTOSTERONE CYPIONATE) 200 MG/ML injection, Inject 100 mg into the muscle 2 (two) times a week., Disp: , Rfl: 3 .  traZODone (DESYREL) 100 MG tablet, Take 150 mg by mouth at bedtime., Disp: , Rfl:    Review of Systems  Constitutional: Negative for appetite change, chills, diaphoresis, fatigue, fever and unexpected weight change.  HENT: Positive for dental problem. Negative for congestion, drooling, ear pain, facial swelling, hearing loss, mouth sores, sneezing, sore throat, trouble swallowing and voice change.   Eyes: Negative for pain, discharge, redness, itching and visual disturbance.  Respiratory: Negative for cough, choking, shortness of breath and wheezing.   Cardiovascular: Negative for chest pain, palpitations and leg swelling.  Gastrointestinal: Negative for abdominal pain, blood in stool, constipation, diarrhea and vomiting.  Endocrine: Negative for  cold intolerance, heat intolerance and polydipsia.  Genitourinary: Positive for decreased urine volume. Negative for dysuria and hematuria.  Musculoskeletal: Positive for arthralgias, back pain and gait problem.  Skin: Negative for rash.  Allergic/Immunologic: Negative for environmental allergies.  Neurological: Negative for seizures, syncope, light-headedness and headaches.  Hematological: Negative for adenopathy.  Psychiatric/Behavioral: Positive for dysphoric mood. Negative for agitation and suicidal ideas. The patient is not nervous/anxious.     Per HPI unless specifically indicated  above     Objective:    BP 130/80 (BP Location: Left Arm, Patient Position: Sitting, Cuff Size: Normal)   Pulse (!) 110   Temp (!) 97.5 F (36.4 C)   Ht 6\' 2"  (1.88 m)   Wt 170 lb (77.1 kg)   SpO2 99%   BMI 21.83 kg/m   Wt Readings from Last 3 Encounters:  09/26/16 170 lb (77.1 kg)  09/24/16 180 lb (81.6 kg)  09/12/16 177 lb (80.3 kg)   Orthostatic VS reviewed   Physical Exam  Constitutional: He is oriented to person, place, and time. He appears well-developed and well-nourished.  HENT:  Head: Normocephalic and atraumatic.  Neck: Neck supple.  Cardiovascular: Normal rate and regular rhythm.   Pulmonary/Chest: Effort normal and breath sounds normal. He has no wheezes.  Abdominal: Soft. Bowel sounds are normal. There is no hepatosplenomegaly. There is no tenderness.  Musculoskeletal: He exhibits no edema.  Lymphadenopathy:    He has no cervical adenopathy.  Neurological: He is alert and oriented to person, place, and time. He has normal strength. He displays no tremor. No cranial nerve deficit or sensory deficit. He exhibits normal muscle tone. Coordination and gait normal.  Skin: Skin is warm and dry.  Psychiatric: He has a normal mood and affect. His behavior is normal.  Vitals reviewed.   Results for orders placed or performed during the hospital encounter of 09/24/16  CBC with Differential  Result Value Ref Range   WBC 9.9 4.0 - 10.5 K/uL   RBC 4.67 4.22 - 5.81 MIL/uL   Hemoglobin 15.5 13.0 - 17.0 g/dL   HCT 43.3 39.0 - 52.0 %   MCV 92.7 78.0 - 100.0 fL   MCH 33.2 26.0 - 34.0 pg   MCHC 35.8 30.0 - 36.0 g/dL   RDW 13.2 11.5 - 15.5 %   Platelets 292 150 - 400 K/uL   Neutrophils Relative % 75 %   Neutro Abs 7.3 1.7 - 7.7 K/uL   Lymphocytes Relative 13 %   Lymphs Abs 1.3 0.7 - 4.0 K/uL   Monocytes Relative 12 %   Monocytes Absolute 1.2 (H) 0.1 - 1.0 K/uL   Eosinophils Relative 0 %   Eosinophils Absolute 0.0 0.0 - 0.7 K/uL   Basophils Relative 0 %   Basophils  Absolute 0.0 0.0 - 0.1 K/uL  Comprehensive metabolic panel  Result Value Ref Range   Sodium 134 (L) 135 - 145 mmol/L   Potassium 3.8 3.5 - 5.1 mmol/L   Chloride 95 (L) 101 - 111 mmol/L   CO2 24 22 - 32 mmol/L   Glucose, Bld 124 (H) 65 - 99 mg/dL   BUN 20 6 - 20 mg/dL   Creatinine, Ser 1.41 (H) 0.61 - 1.24 mg/dL   Calcium 10.6 (H) 8.9 - 10.3 mg/dL   Total Protein 7.8 6.5 - 8.1 g/dL   Albumin 4.3 3.5 - 5.0 g/dL   AST 42 (H) 15 - 41 U/L   ALT 23 17 - 63 U/L   Alkaline Phosphatase 76  38 - 126 U/L   Total Bilirubin 3.1 (H) 0.3 - 1.2 mg/dL   GFR calc non Af Amer 55 (L) >60 mL/min   GFR calc Af Amer >60 >60 mL/min   Anion gap 15 5 - 15  Troponin I  Result Value Ref Range   Troponin I <0.03 <0.03 ng/mL  TSH  Result Value Ref Range   TSH 1.411 0.350 - 4.500 uIU/mL      Assessment & Plan:    Encounter Diagnoses  Name Primary?  . Dizzy Yes  . Dehydration   . Essential hypertension     -pt is to take only 1/2 lisinopril daily.  He is to push fluids.  He is to stop the amitriptyline.   -pt will follow up next week -pt is counseled to bring ALL meds to next OV -discussed signs/symptoms that would indicate he should return to the ER

## 2016-09-26 NOTE — Patient Instructions (Addendum)
Cut lisinopril 20mg  tablet in half.   Stop the amitriptyline Bring ALL meds to next OV PUSH FLUIDS    Dehydration, Adult Dehydration is a condition in which there is not enough fluid or water in the body. This happens when you lose more fluids than you take in. Important organs, such as the kidneys, brain, and heart, cannot function without a proper amount of fluids. Any loss of fluids from the body can lead to dehydration. Dehydration can range from mild to severe. This condition should be treated right away to prevent it from becoming severe. What are the causes? This condition may be caused by:  Vomiting.  Diarrhea.  Excessive sweating, such as from heat exposure or exercise.  Not drinking enough fluid, especially: ? When ill. ? While doing activity that requires a lot of energy.  Excessive urination.  Fever.  Infection.  Certain medicines, such as medicines that cause the body to lose excess fluid (diuretics).  Inability to access safe drinking water.  Reduced physical ability to get adequate water and food.  What increases the risk? This condition is more likely to develop in people:  Who have a poorly controlled long-term (chronic) illness, such as diabetes, heart disease, or kidney disease.  Who are age 65 or older.  Who are disabled.  Who live in a place with high altitude.  Who play endurance sports.  What are the signs or symptoms? Symptoms of mild dehydration may include:  Thirst.  Dry lips.  Slightly dry mouth.  Dry, warm skin.  Dizziness. Symptoms of moderate dehydration may include:  Very dry mouth.  Muscle cramps.  Dark urine. Urine may be the color of tea.  Decreased urine production.  Decreased tear production.  Heartbeat that is irregular or faster than normal (palpitations).  Headache.  Light-headedness, especially when you stand up from a sitting position.  Fainting (syncope). Symptoms of severe dehydration may  include:  Changes in skin, such as: ? Cold and clammy skin. ? Blotchy (mottled) or pale skin. ? Skin that does not quickly return to normal after being lightly pinched and released (poor skin turgor).  Changes in body fluids, such as: ? Extreme thirst. ? No tear production. ? Inability to sweat when body temperature is high, such as in hot weather. ? Very little urine production.  Changes in vital signs, such as: ? Weak pulse. ? Pulse that is more than 100 beats a minute when sitting still. ? Rapid breathing. ? Low blood pressure.  Other changes, such as: ? Sunken eyes. ? Cold hands and feet. ? Confusion. ? Lack of energy (lethargy). ? Difficulty waking up from sleep. ? Short-term weight loss. ? Unconsciousness. How is this diagnosed? This condition is diagnosed based on your symptoms and a physical exam. Blood and urine tests may be done to help confirm the diagnosis. How is this treated? Treatment for this condition depends on the severity. Mild or moderate dehydration can often be treated at home. Treatment should be started right away. Do not wait until dehydration becomes severe. Severe dehydration is an emergency and it needs to be treated in a hospital. Treatment for mild dehydration may include:  Drinking more fluids.  Replacing salts and minerals in your blood (electrolytes) that you may have lost. Treatment for moderate dehydration may include:  Drinking an oral rehydration solution (ORS). This is a drink that helps you replace fluids and electrolytes (rehydrate). It can be found at pharmacies and retail stores. Treatment for severe dehydration may include:  Receiving fluids through an IV tube.  Receiving an electrolyte solution through a feeding tube that is passed through your nose and into your stomach (nasogastric tube, or NG tube).  Correcting any abnormalities in electrolytes.  Treating the underlying cause of dehydration. Follow these instructions at  home:  If directed by your health care provider, drink an ORS: ? Make an ORS by following instructions on the package. ? Start by drinking small amounts, about  cup (120 mL) every 5-10 minutes. ? Slowly increase how much you drink until you have taken the amount recommended by your health care provider.  Drink enough clear fluid to keep your urine clear or pale yellow. If you were told to drink an ORS, finish the ORS first, then start slowly drinking other clear fluids. Drink fluids such as: ? Water. Do not drink only water. Doing that can lead to having too little salt (sodium) in the body (hyponatremia). ? Ice chips. ? Fruit juice that you have added water to (diluted fruit juice). ? Low-calorie sports drinks.  Avoid: ? Alcohol. ? Drinks that contain a lot of sugar. These include high-calorie sports drinks, fruit juice that is not diluted, and soda. ? Caffeine. ? Foods that are greasy or contain a lot of fat or sugar.  Take over-the-counter and prescription medicines only as told by your health care provider.  Do not take sodium tablets. This can lead to having too much sodium in the body (hypernatremia).  Eat foods that contain a healthy balance of electrolytes, such as bananas, oranges, potatoes, tomatoes, and spinach.  Keep all follow-up visits as told by your health care provider. This is important. Contact a health care provider if:  You have abdominal pain that: ? Gets worse. ? Stays in one area (localizes).  You have a rash.  You have a stiff neck.  You are more irritable than usual.  You are sleepier or more difficult to wake up than usual.  You feel weak or dizzy.  You feel very thirsty.  You have urinated only a small amount of very dark urine over 6-8 hours. Get help right away if:  You have symptoms of severe dehydration.  You cannot drink fluids without vomiting.  Your symptoms get worse with treatment.  You have a fever.  You have a severe  headache.  You have vomiting or diarrhea that: ? Gets worse. ? Does not go away.  You have blood or green matter (bile) in your vomit.  You have blood in your stool. This may cause stool to look black and tarry.  You have not urinated in 6-8 hours.  You faint.  Your heart rate while sitting still is over 100 beats a minute.  You have trouble breathing. This information is not intended to replace advice given to you by your health care provider. Make sure you discuss any questions you have with your health care provider. Document Released: 02/27/2005 Document Revised: 09/24/2015 Document Reviewed: 04/23/2015 Elsevier Interactive Patient Education  [employer].

## 2016-09-28 ENCOUNTER — Telehealth: Payer: Self-pay

## 2016-09-28 ENCOUNTER — Encounter: Payer: Self-pay | Admitting: Neurology

## 2016-09-28 NOTE — Telephone Encounter (Signed)
Pt called and cancelled same day EMG.

## 2016-09-29 ENCOUNTER — Encounter: Payer: Self-pay | Admitting: Neurology

## 2016-10-02 ENCOUNTER — Encounter: Payer: Self-pay | Admitting: Physician Assistant

## 2016-10-02 ENCOUNTER — Ambulatory Visit: Payer: Self-pay | Admitting: Physician Assistant

## 2016-10-02 ENCOUNTER — Other Ambulatory Visit (HOSPITAL_COMMUNITY)
Admission: RE | Admit: 2016-10-02 | Discharge: 2016-10-02 | Disposition: A | Discharge status: Home / Self Care | Payer: No Typology Code available for payment source | Source: Ambulatory Visit | Attending: Physician Assistant | Admitting: Physician Assistant

## 2016-10-02 VITALS — BP 122/78 | HR 81 | Temp 97.9°F | Ht 74.0 in | Wt 169.0 lb

## 2016-10-02 DIAGNOSIS — I10 Essential (primary) hypertension: Secondary | ICD-10-CM

## 2016-10-02 DIAGNOSIS — R55 Syncope and collapse: Secondary | ICD-10-CM

## 2016-10-02 DIAGNOSIS — H539 Unspecified visual disturbance: Secondary | ICD-10-CM | POA: Insufficient documentation

## 2016-10-02 DIAGNOSIS — M5416 Radiculopathy, lumbar region: Secondary | ICD-10-CM | POA: Insufficient documentation

## 2016-10-02 DIAGNOSIS — R202 Paresthesia of skin: Secondary | ICD-10-CM | POA: Insufficient documentation

## 2016-10-02 DIAGNOSIS — I959 Hypotension, unspecified: Secondary | ICD-10-CM

## 2016-10-02 DIAGNOSIS — R531 Weakness: Secondary | ICD-10-CM | POA: Insufficient documentation

## 2016-10-02 DIAGNOSIS — R270 Ataxia, unspecified: Secondary | ICD-10-CM

## 2016-10-02 DIAGNOSIS — R42 Dizziness and giddiness: Secondary | ICD-10-CM

## 2016-10-02 DIAGNOSIS — F329 Major depressive disorder, single episode, unspecified: Secondary | ICD-10-CM

## 2016-10-02 DIAGNOSIS — R200 Anesthesia of skin: Secondary | ICD-10-CM | POA: Insufficient documentation

## 2016-10-02 DIAGNOSIS — S0181XA Laceration without foreign body of other part of head, initial encounter: Secondary | ICD-10-CM

## 2016-10-02 DIAGNOSIS — I951 Orthostatic hypotension: Secondary | ICD-10-CM

## 2016-10-02 DIAGNOSIS — F32A Depression, unspecified: Secondary | ICD-10-CM

## 2016-10-02 DIAGNOSIS — G629 Polyneuropathy, unspecified: Secondary | ICD-10-CM

## 2016-10-02 DIAGNOSIS — M25561 Pain in right knee: Secondary | ICD-10-CM

## 2016-10-02 LAB — COMPREHENSIVE METABOLIC PANEL
ALT: 27 U/L (ref 17–63)
AST: 51 U/L — AB (ref 15–41)
Albumin: 3.9 g/dL (ref 3.5–5.0)
Alkaline Phosphatase: 84 U/L (ref 38–126)
Anion gap: 11 (ref 5–15)
BILIRUBIN TOTAL: 1.4 mg/dL — AB (ref 0.3–1.2)
BUN: 6 mg/dL (ref 6–20)
CO2: 27 mmol/L (ref 22–32)
Calcium: 9.7 mg/dL (ref 8.9–10.3)
Chloride: 94 mmol/L — ABNORMAL LOW (ref 101–111)
Creatinine, Ser: 1.09 mg/dL (ref 0.61–1.24)
GFR calc Af Amer: >60 mL/min (ref >60)
Glucose, Bld: 129 mg/dL — ABNORMAL HIGH (ref 65–99)
POTASSIUM: 4.4 mmol/L (ref 3.5–5.1)
Sodium: 132 mmol/L — ABNORMAL LOW (ref 135–145)
TOTAL PROTEIN: 7.4 g/dL (ref 6.5–8.1)

## 2016-10-02 LAB — CBC
HEMATOCRIT: 36.7 % — AB (ref 39.0–52.0)
Hemoglobin: 13.2 g/dL (ref 13.0–17.0)
MCH: 33.4 pg (ref 26.0–34.0)
MCHC: 36 g/dL (ref 30.0–36.0)
MCV: 92.9 fL (ref 78.0–100.0)
Platelets: 333 10*3/uL (ref 150–400)
RBC: 3.95 MIL/uL — ABNORMAL LOW (ref 4.22–5.81)
RDW: 14.4 % (ref 11.5–15.5)
WBC: 10.1 10*3/uL (ref 4.0–10.5)

## 2016-10-02 NOTE — Progress Notes (Signed)
BP 122/78 (BP Location: Left Arm, Patient Position: Sitting, Cuff Size: Normal)   Pulse 81   Temp 97.9 F (36.6 C) (Other (Comment))   Ht 6\' 2"  (1.88 m)   Wt 169 lb (76.7 kg)   SpO2 98%   BMI 21.70 kg/m    Subjective:    Patient ID: Tristan Cook, male    DOB: Feb 17, 1961, 56 y.o.   MRN: [id number]  HPI: Tristan Cook is a 56 y.o. male presenting on 10/02/2016 for Dizziness (states dizziness occurs at night, fell last night and last Tuesday night)   HPI   Chief Complaint  Patient presents with  . Dizziness    states dizziness occurs at night, fell last night and last Tuesday night     Pt has nerve conduction test later this week.   He says he will be seeing the neurologist for appointment after his test.  He is being seen for ataxia and neuropathy.   Pt feels no different today than he did when he was here on 7/17.  He has been drinking plenty of fluids and cut back his lisinopril to 1/2 tab daily.   He has not been spending much time outside in the heat.   The meclizine he was given in the ER do not help him.   Pt has had 2 episodes of LOC.  Last Tuesday and he hurt his R knee.  Again last night and he cut his L eyebrow.   He says he can feel it coming on- it feels like a rush- he says he tries to find something to grab on to or sit down.  He says the episodes don't last long and he says he is awake almost by the time he hits the flood.   Pt denies seizure activity and says he is not confused when he awakens.  No incontinence.  He says these episodes are usually in the evening when he is "winding down".   Pt denies CP or HA, just feels "like a rush"  Pt says he just started the prozac yesterday (because he was finishing up "those little blue pills"- ? Temazepam)  Relevant past medical, surgical, family and social history reviewed and updated as indicated. Interim medical history since our last visit reviewed. Allergies and medications reviewed and updated.    Current  Outpatient Prescriptions:  .  aspirin 81 MG tablet, Take 1 tablet (81 mg total) by mouth daily., Disp: 30 tablet, Rfl:  .  B Complex Vitamins (VITAMIN B COMPLEX) TABS, Take 1 tablet by mouth daily. , Disp: , Rfl:  .  cholecalciferol (VITAMIN D) 1000 units tablet, Take 1,000 Units by mouth daily., Disp: , Rfl:  .  diphenhydrAMINE (SOMINEX) 25 MG tablet, Take 25 mg by mouth at bedtime as needed for sleep., Disp: , Rfl:  .  docusate sodium (COLACE) 100 MG capsule, Take 100 mg by mouth 2 (two) times daily., Disp: , Rfl:  .  FLUoxetine (PROZAC) 20 MG tablet, Take 1 tablet (20 mg total) by mouth daily., Disp: 90 tablet, Rfl: 1 .  lisinopril (PRINIVIL,ZESTRIL) 20 MG tablet, Take 1 tablet (20 mg total) by mouth daily., Disp: 90 tablet, Rfl: 2 .  testosterone cypionate (DEPOTESTOSTERONE CYPIONATE) 200 MG/ML injection, Inject 100 mg into the muscle 2 (two) times a week., Disp: , Rfl: 3  Review of Systems  Constitutional: Negative for appetite change, chills, diaphoresis, fatigue, fever and unexpected weight change.  HENT: Negative for congestion, drooling, ear pain, facial swelling,  hearing loss, mouth sores, sneezing, sore throat, trouble swallowing and voice change.   Eyes: Negative for pain, discharge, redness, itching and visual disturbance.  Respiratory: Negative for cough, choking, shortness of breath and wheezing.   Cardiovascular: Negative for chest pain, palpitations and leg swelling.  Gastrointestinal: Negative for abdominal pain, blood in stool, constipation, diarrhea and vomiting.  Endocrine: Negative for cold intolerance, heat intolerance and polydipsia.  Genitourinary: Negative for decreased urine volume, dysuria and hematuria.  Musculoskeletal: Positive for arthralgias, back pain and gait problem.  Skin: Negative for rash.  Allergic/Immunologic: Negative for environmental allergies.  Neurological: Positive for syncope. Negative for seizures, light-headedness and headaches.  Hematological:  Negative for adenopathy.  Psychiatric/Behavioral: Positive for dysphoric mood. Negative for agitation and suicidal ideas. The patient is not nervous/anxious.     Per HPI unless specifically indicated above     Objective:    BP 122/78 (BP Location: Left Arm, Patient Position: Sitting, Cuff Size: Normal)   Pulse 81   Temp 97.9 F (36.6 C) (Other (Comment))   Ht 6\' 2"  (1.88 m)   Wt 169 lb (76.7 kg)   SpO2 98%   BMI 21.70 kg/m   Wt Readings from Last 3 Encounters:  10/02/16 169 lb (76.7 kg)  09/26/16 170 lb (77.1 kg)  09/24/16 180 lb (81.6 kg)    Orthostatic VS for the past 24 hrs:  BP- Lying Pulse- Lying BP- Sitting Pulse- Sitting BP- Standing at 0 minutes Pulse- Standing at 0 minutes  10/02/16 0943 136/77 111 126/83 116 102/71 130       Physical Exam  Constitutional: He is oriented to person, place, and time. He appears well-developed and well-nourished.  HENT:  Head: Normocephalic. Head is with contusion and with laceration.    Mild swelling lateral edge L eyebrow with scab from laceration last night.  No active bleeding.  Mildly tender.  Eye not involved.  Eyes: Pupils are equal, round, and reactive to light. Conjunctivae and EOM are normal.  Neck: Neck supple.  Cardiovascular: Normal rate and regular rhythm.   Pulmonary/Chest: Effort normal and breath sounds normal. He has no wheezes.  Abdominal: Soft. Bowel sounds are normal. There is no hepatosplenomegaly. There is no tenderness.  Musculoskeletal: He exhibits no edema.       Right knee: He exhibits normal range of motion, no swelling, no effusion, no ecchymosis, no deformity and normal alignment. Tenderness found.  Lymphadenopathy:    He has no cervical adenopathy.  Neurological: He is alert and oriented to person, place, and time. He displays tremor (fine tremor L hand). No cranial nerve deficit. Gait (ambulates slowly, without cane. no antalgic gait) abnormal.  Skin: Skin is warm and dry.  Psychiatric: He has a  normal mood and affect. His speech is normal and behavior is normal. Thought content normal.  Vitals reviewed.    EKG- ST at 103. unchanged from EKG done in ER on 09/24/16    Results for orders placed or performed during the hospital encounter of 09/24/16  CBC with Differential  Result Value Ref Range   WBC 9.9 4.0 - 10.5 K/uL   RBC 4.67 4.22 - 5.81 MIL/uL   Hemoglobin 15.5 13.0 - 17.0 g/dL   HCT 43.3 39.0 - 52.0 %   MCV 92.7 78.0 - 100.0 fL   MCH 33.2 26.0 - 34.0 pg   MCHC 35.8 30.0 - 36.0 g/dL   RDW 13.2 11.5 - 15.5 %   Platelets 292 150 - 400 K/uL   Neutrophils Relative %  75 %   Neutro Abs 7.3 1.7 - 7.7 K/uL   Lymphocytes Relative 13 %   Lymphs Abs 1.3 0.7 - 4.0 K/uL   Monocytes Relative 12 %   Monocytes Absolute 1.2 (H) 0.1 - 1.0 K/uL   Eosinophils Relative 0 %   Eosinophils Absolute 0.0 0.0 - 0.7 K/uL   Basophils Relative 0 %   Basophils Absolute 0.0 0.0 - 0.1 K/uL  Comprehensive metabolic panel  Result Value Ref Range   Sodium 134 (L) 135 - 145 mmol/L   Potassium 3.8 3.5 - 5.1 mmol/L   Chloride 95 (L) 101 - 111 mmol/L   CO2 24 22 - 32 mmol/L   Glucose, Bld 124 (H) 65 - 99 mg/dL   BUN 20 6 - 20 mg/dL   Creatinine, Ser 1.41 (H) 0.61 - 1.24 mg/dL   Calcium 10.6 (H) 8.9 - 10.3 mg/dL   Total Protein 7.8 6.5 - 8.1 g/dL   Albumin 4.3 3.5 - 5.0 g/dL   AST 42 (H) 15 - 41 U/L   ALT 23 17 - 63 U/L   Alkaline Phosphatase 76 38 - 126 U/L   Total Bilirubin 3.1 (H) 0.3 - 1.2 mg/dL   GFR calc non Af Amer 55 (L) >60 mL/min   GFR calc Af Amer >60 >60 mL/min   Anion gap 15 5 - 15  Troponin I  Result Value Ref Range   Troponin I <0.03 <0.03 ng/mL  TSH  Result Value Ref Range   TSH 1.411 0.350 - 4.500 uIU/mL      Assessment & Plan:    Encounter Diagnoses  Name Primary?  . Syncope, unspecified syncope type Yes  . Orthostatic hypotension   . Facial laceration, initial encounter   . Acute pain of right knee   . Dizzy   . Neuropathy   . Depression, unspecified  depression type   . Essential hypertension     -Discussed with pt.  He did not want to go to the ER.  -will Refer to cardiology for input with orthostatic hypotension and syncope -Pt says he has [facility] discount good through 01/21/17  -pt counseled No driving.  Told him [place] law states no driving until event free for 6 month -will repeat Labs today- cbc and cmp- would like to order acth stim test but lab doesn't do it -told pt to make sure he discusses his syncopal episodes with his neurologist later this week when he goes in for his appointment -counseled pt to stand up while holding on to chair or wall and wait for a few minutes before walking off to help reduce changes of LOC or fall.  Counseled to pt use his cane or a walker to help also. -discussed with pt that if he gets a laceration like he did last night, he needs to be seen for possible sutures at the time of injury.  Counseled on wound care.  His Td is UTD.  Counseled ice and ace-wrap for knee pain -pt to follow up in 2 weeks.  RTO sooner prn new symptoms or worsening

## 2016-10-03 LAB — HEMOGLOBIN A1C
HEMOGLOBIN A1C: 5.1 % (ref 4.8–5.6)
Mean Plasma Glucose: 100 mg/dL

## 2016-10-04 ENCOUNTER — Telehealth: Payer: Self-pay | Admitting: *Deleted

## 2016-10-04 ENCOUNTER — Ambulatory Visit: Payer: Self-pay | Admitting: Family Medicine

## 2016-10-04 NOTE — Telephone Encounter (Signed)
Called and spoke with pt. Advised hemoglobin A1C normal per AA,MD. No diabetes. Pt verbalized understanding.

## 2016-10-04 NOTE — Telephone Encounter (Signed)
-----   Message from Melvenia Beam, MD sent at 10/03/2016  5:00 PM EDT ----- [id number] is normal no diabetes thanls

## 2016-10-05 ENCOUNTER — Encounter: Payer: Self-pay | Admitting: Neurology

## 2016-10-06 ENCOUNTER — Telehealth: Payer: Self-pay | Admitting: Neurology

## 2016-10-06 NOTE — Telephone Encounter (Signed)
Jen, Patient has no showed 2 emg/ncs. This is an issue as other patients are waiting to have this test done. Please cancel his emg on August 8th, I cannot extend this to him again until he is seen in the office. Make him first available with carolyn martin. Please discuss with him.  thank you.

## 2016-10-06 NOTE — Telephone Encounter (Signed)
Called pt who said that he did arrive at 8 am yesterday morning as scheduled for NCV/EMG. However, he has [facility] Patient Assistance. Although pt has letter stating that he has 100% coverage, the front desk staff had difficulty with verifying it for the procedure. They rescheduled him for 10/18/16. Pt is in the process of re-sending his application. Agreed to call and cancel upcoming appt in plenty of time to not have another no-show.

## 2016-10-09 NOTE — Telephone Encounter (Signed)
Called and spoke to Patient he is [place] with assistance . [place] called him this morning.

## 2016-10-16 ENCOUNTER — Ambulatory Visit: Payer: Self-pay | Admitting: Physician Assistant

## 2016-10-16 ENCOUNTER — Encounter: Payer: Self-pay | Admitting: Physician Assistant

## 2016-10-16 VITALS — BP 152/106 | HR 114 | Ht 74.0 in | Wt 166.0 lb

## 2016-10-16 DIAGNOSIS — I10 Essential (primary) hypertension: Secondary | ICD-10-CM

## 2016-10-16 NOTE — Progress Notes (Signed)
BP (!) 152/106 (BP Location: Left Arm, Patient Position: Sitting, Cuff Size: Normal)   Pulse (!) 114   Ht 6\' 2"  (1.88 m)   Wt 166 lb (75.3 kg)   SpO2 98%   BMI 21.31 kg/m    Subjective:    Patient ID: Tristan Cook, male    DOB: [birth year], 56 y.o.   MRN: [id number]  HPI: Tristan Cook is a 56 y.o. male presenting on 10/16/2016 for Follow-up   HPI  Pt didn't get his EMG due to issues with his [facility] discount.  He is scheduled to get the test done soon  Pt is still taking 1/2 his lisinopril.  He hasn't had any more episodes of 'falling out' since he was here last.  He is feeling a lot better.    Relevant past medical, surgical, family and social history reviewed and updated as indicated. Interim medical history since our last visit reviewed. Allergies and medications reviewed and updated.   Current Outpatient Prescriptions:  .  aspirin 81 MG tablet, Take 1 tablet (81 mg total) by mouth daily., Disp: 30 tablet, Rfl:  .  B Complex Vitamins (VITAMIN B COMPLEX) TABS, Take 1 tablet by mouth daily. , Disp: , Rfl:  .  cholecalciferol (VITAMIN D) 1000 units tablet, Take 1,000 Units by mouth daily., Disp: , Rfl:  .  diphenhydrAMINE (SOMINEX) 25 MG tablet, Take 25 mg by mouth at bedtime as needed for sleep., Disp: , Rfl:  .  docusate sodium (COLACE) 100 MG capsule, Take 100 mg by mouth 2 (two) times daily., Disp: , Rfl:  .  FLUoxetine (PROZAC) 20 MG tablet, Take 1 tablet (20 mg total) by mouth daily., Disp: 90 tablet, Rfl: 1 .  lisinopril (PRINIVIL,ZESTRIL) 20 MG tablet, Take 1 tablet (20 mg total) by mouth daily., Disp: 90 tablet, Rfl: 2 .  testosterone cypionate (DEPOTESTOSTERONE CYPIONATE) 200 MG/ML injection, Inject 100 mg into the muscle 2 (two) times a week., Disp: , Rfl: 3   Review of Systems  Constitutional: Negative for appetite change, chills, diaphoresis, fatigue, fever and unexpected weight change.  HENT: Negative for congestion, drooling, ear pain, facial swelling, hearing  loss, mouth sores, sneezing, sore throat, trouble swallowing and voice change.   Eyes: Negative for pain, discharge, redness, itching and visual disturbance.  Respiratory: Negative for cough, choking, shortness of breath and wheezing.   Cardiovascular: Negative for chest pain, palpitations and leg swelling.  Gastrointestinal: Negative for abdominal pain, blood in stool, constipation, diarrhea and vomiting.  Endocrine: Negative for cold intolerance, heat intolerance and polydipsia.  Genitourinary: Positive for decreased urine volume. Negative for dysuria and hematuria.  Musculoskeletal: Positive for arthralgias and gait problem. Negative for back pain.  Skin: Negative for rash.  Allergic/Immunologic: Negative for environmental allergies.  Neurological: Negative for seizures, syncope, light-headedness and headaches.  Hematological: Negative for adenopathy.  Psychiatric/Behavioral: Positive for dysphoric mood. Negative for agitation and suicidal ideas. The patient is not nervous/anxious.     Per HPI unless specifically indicated above     Objective:    BP (!) 152/106 (BP Location: Left Arm, Patient Position: Sitting, Cuff Size: Normal)   Pulse (!) 114   Ht 6\' 2"  (1.88 m)   Wt 166 lb (75.3 kg)   SpO2 98%   BMI 21.31 kg/m   Wt Readings from Last 3 Encounters:  10/16/16 166 lb (75.3 kg)  10/02/16 169 lb (76.7 kg)  09/26/16 170 lb (77.1 kg)    Orthostatic VS for the past 24 hrs:  BP- Lying Pulse- Lying BP- Sitting Pulse- Sitting BP- Standing at 0 minutes Pulse- Standing at 0 minutes  10/16/16 0936 (!) 156/98 108 (!) 159/103 112 (!) 135/93 125       Physical Exam  Constitutional: He is oriented to person, place, and time. He appears well-developed and well-nourished.  HENT:  Head: Normocephalic and atraumatic.  Neck: Neck supple.  Cardiovascular: Normal rate and regular rhythm.   Pulmonary/Chest: Effort normal and breath sounds normal. He has no wheezes.  Abdominal: Soft. Bowel  sounds are normal. There is no hepatosplenomegaly. There is no tenderness.  Musculoskeletal: He exhibits no edema.  Lymphadenopathy:    He has no cervical adenopathy.  Neurological: He is alert and oriented to person, place, and time. Gait (ambultest slowly with a cane) abnormal.  Skin: Skin is warm and dry.  Psychiatric: He has a normal mood and affect. His behavior is normal.  Vitals reviewed.   Results for orders placed or performed during the hospital encounter of 10/02/16  Hemoglobin A1c  Result Value Ref Range   Hgb A1c MFr Bld 5.1 4.8 - 5.6 %   Mean Plasma Glucose 100 mg/dL  CBC  Result Value Ref Range   WBC 10.1 4.0 - 10.5 K/uL   RBC 3.95 (L) 4.22 - 5.81 MIL/uL   Hemoglobin 13.2 13.0 - 17.0 g/dL   HCT 36.7 (L) 39.0 - 52.0 %   MCV 92.9 78.0 - 100.0 fL   MCH 33.4 26.0 - 34.0 pg   MCHC 36.0 30.0 - 36.0 g/dL   RDW 14.4 11.5 - 15.5 %   Platelets 333 150 - 400 K/uL  Comprehensive metabolic panel  Result Value Ref Range   Sodium 132 (L) 135 - 145 mmol/L   Potassium 4.4 3.5 - 5.1 mmol/L   Chloride 94 (L) 101 - 111 mmol/L   CO2 27 22 - 32 mmol/L   Glucose, Bld 129 (H) 65 - 99 mg/dL   BUN 6 6 - 20 mg/dL   Creatinine, Ser 1.09 0.61 - 1.24 mg/dL   Calcium 9.7 8.9 - 10.3 mg/dL   Total Protein 7.4 6.5 - 8.1 g/dL   Albumin 3.9 3.5 - 5.0 g/dL   AST 51 (H) 15 - 41 U/L   ALT 27 17 - 63 U/L   Alkaline Phosphatase 84 38 - 126 U/L   Total Bilirubin 1.4 (H) 0.3 - 1.2 mg/dL   GFR calc non Af Amer >60 >60 mL/min   GFR calc Af Amer >60 >60 mL/min   Anion gap 11 5 - 15      Assessment & Plan:   Encounter Diagnosis  Name Primary?  . Essential hypertension Yes    -reviewed labs with pt -pt counseled to Go back to taking whole lisinopril daily.  He is still orthostatic by vital signs but his bp is up so he needs to get back on his usual dose.  He is counseled to notify office if he has problems when he resumes his usual dose. -pt to get EMG and follow up with neuro as  scheduled -pt has not had sycopal episode in past week, but his vitals are still orthostatic and there is concern that he may become symptomatic again once his lisinopril is increased back to his usual dose to control his htn.  Pt is to see cardiology for this as scheduled  -pt to follow up here in one month. RTO sooner prn

## 2016-10-16 NOTE — Patient Instructions (Signed)
Return to taking whole lisinopril tablet daily

## 2016-10-18 ENCOUNTER — Telehealth: Payer: Self-pay | Admitting: Student

## 2016-10-18 ENCOUNTER — Encounter: Payer: Self-pay | Admitting: Neurology

## 2016-10-18 NOTE — Telephone Encounter (Signed)
Pt called stating he felt lightheaded while mopping the floor at his house. Pt states he sat down and checked his pulse. Pt states pulse was 131.   Pt was recommended to sit down, drink plenty of water, and to avoid over exertion. Pt was instructed to go to cardiology appointment scheduled for Monday, 10-23-16. Pt verbalized understanding.

## 2016-10-19 ENCOUNTER — Ambulatory Visit: Payer: Self-pay | Admitting: Nurse Practitioner

## 2016-10-22 NOTE — Progress Notes (Signed)
Cardiology Office Note   Date:  10/23/2016   ID:  Tristan Cook, DOB [birth year], MRN [id number]  PCP:  Soyla Dryer, PA-C  Cardiologist:   Tristan Rouge, MD   No chief complaint on file.     History of Present Illness: Tristan Cook is a 56 y.o. male who presents for consultation regarding syncope and postural hypotension. Referred by [facility]. He has been Rx with ACE for HTN and primary notes 10/16/16 indicate "postural" but BP high and resting HR usually appears high. 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 He feels prodrome or "rush" mostly at night. Not always related to postural changes. No chest pain dyspnea or previous cardiac issues Has ? Ataxia and is supposed to see neurologist. Denies excess ETOH Sees a psychiatrist Elavil has made symptoms worse Labs 7/23 reviewed and TSH  A1c normal.  Troponin negative Hct 43  He has had a more broad based walk and left hand tremor Denies stroke Symptoms have been worse last 3 weeks Has appt with neurologist soon   Past Medical History:  Diagnosis Date  . Abdominal pain   . Adenomatous colon polyp 2008  . Arthritis   . Back pain   . Bilateral lumbar radiculopathy   . Chronic back pain   . Constipation   . Depression   . GERD (gastroesophageal reflux disease)   . Gout   . Hemorrhoids   . High cholesterol   . Hypertension   . Neck pain   . Neuropathy of both feet   . Numbness    rectal  . Numbness of legs   . Substance abuse    h/o excessive alcohol use; pt says he limits use to one to 2 beers daily he currently    Past Surgical History:  Procedure Laterality Date  . COLONOSCOPY  10/09/2006   3 mm pedunculated sigmoid colon polyp removed/8-mm sessile hepatic flexure polyp (tubular villous adenoma) removed/6-mm  descending colon polyp removed/small internal hemorrhoids  . COLONOSCOPY  02/28/2011   ZSW:FUXNAT, multiple in the rectum/Internal hemorrhoids,  MODERATE-CAUSING RECTAL BLEEDING  . COLONOSCOPY N/A 07/05/2016   Procedure: COLONOSCOPY;  Surgeon: [name], MD;  [facility]: [facility];  Service: Endoscopy;  Laterality: N/A;  11:15am  . 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 Mild erosive gastrtitis most likely due to indomethacin   . FINGER SURGERY Right    4th and 5th digit  . FLEXIBLE SIGMOIDOSCOPY  01/02/2012   SLF: 1. the colonic mucosa appeared normal in the sigmoid colon 2. Large internal hemorrhoids: cause for rectal bleeding/pain . s/p banding X 3   . HAND SURGERY    . SAVORY DILATION N/A 07/21/2014   Procedure: SAVORY DILATION;  Surgeon: [name], MD;  Location: [facility];  Service: Endoscopy;  Laterality: N/A;     Current Outpatient Prescriptions  Medication Sig Dispense Refill  . aspirin 81 MG tablet Take 1 tablet (81 mg total) by mouth daily. 30 tablet   . B Complex Vitamins (VITAMIN B COMPLEX) TABS Take 1 tablet by mouth daily.     . cholecalciferol (VITAMIN D) 1000 units tablet Take 1,000 Units by mouth daily.    . diphenhydrAMINE (SOMINEX) 25 MG tablet Take 25 mg by mouth at bedtime as needed for sleep.    [name] docusate sodium (COLACE) 100 MG capsule Take 100 mg by mouth 2 (two)  times daily.    [name] FLUoxetine (PROZAC) 20 MG tablet Take 1 tablet (20 mg total) by mouth daily. 90 tablet 1  . testosterone cypionate (DEPOTESTOSTERONE CYPIONATE) 200 MG/ML injection Inject 100 mg into the muscle 2 (two) times a week.  3  . midodrine (PROAMATINE) 10 MG tablet Take 1 tablet (10 mg total) by mouth 3 (three) times daily. 90 tablet 3  . nebivolol (BYSTOLIC) 5 MG tablet Take 1 tablet (5 mg total) by mouth daily. 30 tablet 6   No current facility-administered medications for this visit.     Allergies:   Benadryl [diphenhydramine hcl (sleep)]    Social History:  The patient  reports that he quit smoking about 12 years ago. His smoking use included Cigarettes.  He has a 49.50 pack-year smoking history. His smokeless tobacco use includes Snuff. He reports that he does not drink alcohol or use drugs.   Family History:  The patient's family history includes Cancer in his brother and brother; Diabetes in his maternal grandmother; Hypertension in his brother, brother, father, mother, and sister; Stroke in his father.    ROS:  Please see the history of present illness.   Otherwise, review of systems are positive for none.   All other systems are reviewed and negative.    PHYSICAL EXAM: VS:  BP 120/62 (BP Location: Left Arm)   Pulse (!) 101   Ht 6\' 2"  (1.88 m)   Wt 166 lb (75.3 kg)   SpO2 98%   BMI 21.31 kg/m  , BMI Body mass index is 21.31 kg/m. Affect appropriate Thin black male  HEENT: normal Neck supple with no adenopathy JVP normal no bruits no thyromegaly Lungs clear with no wheezing and good diaphragmatic motion Heart:  S1/S2 no murmur, no rub, gallop or click PMI normal Abdomen: benighn, BS positve, no tenderness, no AAA no bruit.  No HSM or HJR Distal pulses intact with no bruits No edema Neuro non-focal broad based gait left hand tremor  Skin warm and dry No muscular weakness    EKG:  SR rate 103 normal 10/02/16   Recent Labs: 09/24/2016: TSH 1.411 10/02/2016: ALT 27; BUN 6; Creatinine, Ser 1.09; Hemoglobin 13.2; Platelets 333; Potassium 4.4; Sodium 132    Lipid Panel    Component Value Date/Time   CHOL 179 03/03/2016 0741   TRIG 140 03/03/2016 0741   HDL 64 03/03/2016 0741   CHOLHDL 2.8 03/03/2016 0741   VLDL 28 03/03/2016 0741   LDLCALC 87 03/03/2016 0741   LDLDIRECT 63 04/29/2012 0840      Wt Readings from Last 3 Encounters:  10/23/16 166 lb (75.3 kg)  10/16/16 166 lb (75.3 kg)  10/02/16 169 lb (76.7 kg)      Other studies Reviewed: Additional studies/ records that were reviewed today include: Notes primary labs ECG.    ASSESSMENT AND PLAN:  1.  Postural Hypotension/Syncope: this is not a primary  cardiac issue Appears to have dysautonomia. Need to tolerate higher supine systolic pressure. ACE is bad med for him. Given tachycardia start low dose bystolic Start midodrin. Will order echo to make sure he does not have cardiomyopathy and 48 hour holter to assess average HR and rhythm. He can f/u with neurology and primary for dysautonomia Can consider adding florinef or changing to Norhtera in future depending on how he does with middrin MRI with non acute lacunar infarct  2. HTN: tolerate some systolic hypertension due to #1 bystolic 5 mg should not interfere with resting HR much  but blunt HR With standing and ambulation  3. Anxiety/Depression consider stopping Prozac and all centrally acting agents given dysautonomia  4. Neuro/Ataxia F/U Jaynee Eagles unifying diagnosis not clear non diabetic TSH normal consider checking random cortisol RPR and B12 pending not a drinker no ETOH abuse   Current medicines are reviewed at length with the patient today.  The patient does not have concerns regarding medicines.  The following changes have been made:  D/C Lisinopril Start midodrin 10 tid and bystolic  5 mg   Labs/ tests ordered today include: Echo and Holter monitor   Orders Placed This Encounter  Procedures  . Holter monitor - 48 hour  . ECHOCARDIOGRAM COMPLETE     Disposition:   FU with cardiology PRN     Signed, Tristan Rouge, MD  10/23/2016 3:41 PM    [facility] [place], [place], [place]  [address] Phone: [phone]; Fax: [phone]

## 2016-10-23 ENCOUNTER — Ambulatory Visit: Payer: No Typology Code available for payment source | Admitting: Cardiovascular Disease

## 2016-10-23 ENCOUNTER — Ambulatory Visit (INDEPENDENT_AMBULATORY_CARE_PROVIDER_SITE_OTHER): Payer: Self-pay | Admitting: Cardiovascular Disease

## 2016-10-23 ENCOUNTER — Encounter: Payer: Self-pay | Admitting: Cardiovascular Disease

## 2016-10-23 VITALS — BP 120/62 | HR 101 | Ht 74.0 in | Wt 166.0 lb

## 2016-10-23 DIAGNOSIS — R55 Syncope and collapse: Secondary | ICD-10-CM

## 2016-10-23 MED ORDER — NEBIVOLOL HCL 5 MG PO TABS: 5.0000 mg | ORAL_TABLET | Freq: Every day | ORAL | 6 refills | Status: DC

## 2016-10-23 MED ORDER — MIDODRINE HCL 10 MG PO TABS: 10.0000 mg | ORAL_TABLET | Freq: Three times a day (TID) | ORAL | 3 refills | Status: DC

## 2016-10-23 NOTE — Patient Instructions (Signed)
Medication Instructions:  STOP LISINOPRIL  START BYSTOLIC 5 MG DAILY  START MIDIDRINE 10 MG THREE TIMES DAILY   Labwork: NONE  Testing/Procedures: Your physician has recommended that you wear a holter monitor. Holter monitors are medical devices that record the heart's electrical activity. Doctors most often use these monitors to diagnose arrhythmias. Arrhythmias are problems with the speed or rhythm of the heartbeat. The monitor is a small, portable device. You can wear one while you do your normal daily activities. This is usually used to diagnose what is causing palpitations/syncope (passing out).  Your physician has requested that you have an echocardiogram. Echocardiography is a painless test that uses sound waves to create images of your heart. It provides your doctor with information about the size and shape of your heart and how well your heart's chambers and valves are working. This procedure takes approximately one hour. There are no restrictions for this procedure. '   Follow-Up: Your physician recommends that you schedule a follow-up appointment in: 2 MONTHS    Any Other Special Instructions Will Be Listed Below (If Applicable).     If you need a refill on your cardiac medications before your next appointment, please call your pharmacy.

## 2016-10-24 ENCOUNTER — Telehealth: Payer: Self-pay | Admitting: *Deleted

## 2016-10-24 ENCOUNTER — Telehealth: Payer: Self-pay | Admitting: Cardiovascular Disease

## 2016-10-24 MED ORDER — NEBIVOLOL HCL 5 MG PO TABS: 5.0000 mg | ORAL_TABLET | Freq: Every day | ORAL | 6 refills | Status: DC

## 2016-10-24 MED ORDER — MIDODRINE HCL 10 MG PO TABS: 10.0000 mg | ORAL_TABLET | Freq: Three times a day (TID) | ORAL | 3 refills | Status: DC

## 2016-10-24 NOTE — Telephone Encounter (Signed)
Patient states that he can not afford meds that were prescribed yesterday. Also wants to know if we deal with [facility] / tg

## 2016-10-24 NOTE — Addendum Note (Signed)
Addended by: Levonne Hubert on: 10/24/2016 11:51 AM   Modules accepted: Orders

## 2016-10-24 NOTE — Telephone Encounter (Signed)
Can try lopressor 12.5 bid

## 2016-10-24 NOTE — Telephone Encounter (Signed)
Patient is requesting a more cost effective medication that Bystolic. He has no insurance and even with a discount card it's $144. Please advise

## 2016-10-24 NOTE — Telephone Encounter (Signed)
Pt request meds be sent to [facility]. Meds sent as requested.

## 2016-10-25 ENCOUNTER — Ambulatory Visit (HOSPITAL_COMMUNITY)
Admission: RE | Admit: 2016-10-25 | Discharge: 2016-10-25 | Disposition: A | Discharge status: Home / Self Care | Payer: Self-pay | Source: Ambulatory Visit | Attending: Cardiovascular Disease | Admitting: Cardiovascular Disease

## 2016-10-25 DIAGNOSIS — I5189 Other ill-defined heart diseases: Secondary | ICD-10-CM | POA: Insufficient documentation

## 2016-10-25 DIAGNOSIS — R000 Tachycardia, unspecified: Secondary | ICD-10-CM | POA: Insufficient documentation

## 2016-10-25 DIAGNOSIS — R55 Syncope and collapse: Secondary | ICD-10-CM | POA: Insufficient documentation

## 2016-10-25 LAB — ECHOCARDIOGRAM COMPLETE
AVLVOTPG: 3 mmHg
CHL CUP DOP CALC LVOT VTI: 15.9 cm
CHL CUP MV DEC (S): 162
CHL CUP STROKE VOLUME: 31 mL
E decel time: 162 msec
EERAT: 4.75
FS: 32 % (ref 28–44)
IVS/LV PW RATIO, ED: 0.98
LA diam end sys: 23 mm
LA diam index: 1.16 cm/m2
LA vol: 27.3 mL
LASIZE: 23 mm
LAVOLA4C: 25.2 mL
LAVOLIN: 13.8 mL/m2
LDCA: 2.84 cm2
LV E/e' medial: 4.75
LV SIMPSON'S DISK: 62
LV dias vol index: 25 mL/m2
LV dias vol: 50 mL — AB (ref 62–150)
LV e' LATERAL: 10.9 cm/s
LVEEAVG: 4.75
LVOT SV: 45 mL
LVOT diameter: 19 mm
LVOTPV: 80.1 cm/s
LVSYSVOL: 19 mL — AB (ref 21–61)
LVSYSVOLIN: 10 mL/m2
Lateral S' vel: 14.5 cm/s
MV pk A vel: 84 m/s
MVPKEVEL: 51.8 m/s
PW: 9.36 mm — AB (ref 0.6–1.1)
RV TAPSE: 13.4 mm
TDI e' lateral: 10.9
TDI e' medial: 7.62

## 2016-10-25 NOTE — Progress Notes (Signed)
*  PRELIMINARY RESULTS* Echocardiogram 2D Echocardiogram has been performed.  Tristan Cook 10/25/2016, 2:04 PM

## 2016-10-25 NOTE — Telephone Encounter (Signed)
Patient's Rx's have been sent to [facility]. Patient states that all meds are no charge to him but will take 1-2 weeks by mail.

## 2016-11-01 ENCOUNTER — Ambulatory Visit: Payer: Self-pay | Admitting: Urology

## 2016-11-15 ENCOUNTER — Encounter: Payer: Self-pay | Admitting: Neurology

## 2016-11-16 ENCOUNTER — Telehealth: Payer: Self-pay | Admitting: *Deleted

## 2016-11-16 ENCOUNTER — Encounter: Payer: Self-pay | Admitting: Physician Assistant

## 2016-11-16 ENCOUNTER — Ambulatory Visit: Payer: Self-pay | Admitting: Physician Assistant

## 2016-11-16 VITALS — BP 136/90 | HR 98 | Temp 97.7°F | Ht 74.0 in | Wt 164.0 lb

## 2016-11-16 DIAGNOSIS — I10 Essential (primary) hypertension: Secondary | ICD-10-CM

## 2016-11-16 DIAGNOSIS — F329 Major depressive disorder, single episode, unspecified: Secondary | ICD-10-CM

## 2016-11-16 DIAGNOSIS — I951 Orthostatic hypotension: Secondary | ICD-10-CM

## 2016-11-16 DIAGNOSIS — F32A Depression, unspecified: Secondary | ICD-10-CM

## 2016-11-16 DIAGNOSIS — R55 Syncope and collapse: Secondary | ICD-10-CM

## 2016-11-16 MED ORDER — METOPROLOL TARTRATE 25 MG PO TABS: 12.5000 mg | ORAL_TABLET | Freq: Two times a day (BID) | ORAL | 3 refills | Status: DC

## 2016-11-16 MED ORDER — METOPROLOL TARTRATE 25 MG PO TABS
12.5000 mg | ORAL_TABLET | Freq: Two times a day (BID) | ORAL | 1 refills | Status: DC
Start: 2016-11-16 — End: 2017-02-14

## 2016-11-16 NOTE — Telephone Encounter (Signed)
Spoke with [facility] who states that they are unable to provide Bystolic or Midodrine to the pt. Verbal order given for Lopressor 12.5 mg BID. Pt will need to have Midodrine filled by another pharmacy.

## 2016-11-16 NOTE — Progress Notes (Signed)
BP 136/90 (BP Location: Left Arm, Patient Position: Sitting, Cuff Size: Normal)   Pulse 98   Temp 97.7 F (36.5 C) (Other (Comment))   Ht 6\' 2"  (1.88 m)   Wt 164 lb (74.4 kg)   SpO2 99%   BMI 21.06 kg/m    Subjective:    Patient ID: Tristan Cook, male    DOB: 10-17-1960, 56 y.o.   MRN: [id number]  HPI: [name] is a 56 y.o. male presenting on 11/16/2016 for Hypertension   HPI   [facility] and says he has to take another paper to there before he can get in to neurologist.   Cardiology rx bystolic and midodrine. Pt can't afford either.  Cardiology sent metoprolol to [facility] in lieu of bystolic.  There is no similar for the midodrine.  I gave coupon to pt for it.   Pt has already stopped his lisinopril  Relevant past medical, surgical, family and social history reviewed and updated as indicated. Interim medical history since our last visit reviewed. Allergies and medications reviewed and updated.   Current Outpatient Prescriptions:  .  aspirin 81 MG tablet, Take 1 tablet (81 mg total) by mouth daily., Disp: 30 tablet, Rfl:  .  B Complex Vitamins (VITAMIN B COMPLEX) TABS, Take 1 tablet by mouth daily. , Disp: , Rfl:  .  cholecalciferol (VITAMIN D) 1000 units tablet, Take 1,000 Units by mouth daily., Disp: , Rfl:  .  diphenhydrAMINE (SOMINEX) 25 MG tablet, Take 25 mg by mouth at bedtime as needed for sleep., Disp: , Rfl:  .  docusate sodium (COLACE) 100 MG capsule, Take 100 mg by mouth 2 (two) times daily., Disp: , Rfl:  .  FLUoxetine (PROZAC) 20 MG tablet, Take 1 tablet (20 mg total) by mouth daily., Disp: 90 tablet, Rfl: 1 .  testosterone cypionate (DEPOTESTOSTERONE CYPIONATE) 200 MG/ML injection, Inject 100 mg into the muscle 2 (two) times a week., Disp: , Rfl: 3 .  metoprolol tartrate (LOPRESSOR) 25 MG tablet, Take 0.5 tablets (12.5 mg total) by mouth 2 (two) times daily. (Patient not taking: Reported on 11/16/2016), Disp: 180  tablet, Rfl: 3 .  midodrine (PROAMATINE) 10 MG tablet, Take 1 tablet (10 mg total) by mouth 3 (three) times daily. (Patient not taking: Reported on 11/16/2016), Disp: 90 tablet, Rfl: 3   Review of Systems  Constitutional: Negative for appetite change, chills, diaphoresis, fatigue, fever and unexpected weight change.  HENT: Positive for hearing loss. Negative for congestion, dental problem, drooling, ear pain, facial swelling, mouth sores, sneezing, sore throat, trouble swallowing and voice change.   Eyes: Negative for pain, discharge, redness, itching and visual disturbance.  Respiratory: Negative for cough, choking, shortness of breath and wheezing.   Cardiovascular: Negative for chest pain, palpitations and leg swelling.  Gastrointestinal: Negative for abdominal pain, blood in stool, constipation, diarrhea and vomiting.  Endocrine: Negative for cold intolerance, heat intolerance and polydipsia.  Genitourinary: Negative for decreased urine volume, dysuria and hematuria.  Musculoskeletal: Positive for back pain and gait problem. Negative for arthralgias.  Skin: Negative for rash.  Allergic/Immunologic: Negative for environmental allergies.  Neurological: Negative for seizures, syncope, light-headedness and headaches.  Hematological: Negative for adenopathy.  Psychiatric/Behavioral: Negative for agitation, dysphoric mood and suicidal ideas. The patient is not nervous/anxious.     Per HPI unless specifically indicated above     Objective:    BP 136/90 (BP Location: Left Arm, Patient Position: Sitting, Cuff Size: Normal)  Pulse 98   Temp 97.7 F (36.5 C) (Other (Comment))   Ht 6\' 2"  (1.88 m)   Wt 164 lb (74.4 kg)   SpO2 99%   BMI 21.06 kg/m   Wt Readings from Last 3 Encounters:  11/16/16 164 lb (74.4 kg)  10/23/16 166 lb (75.3 kg)  10/16/16 166 lb (75.3 kg)    Physical Exam  Constitutional: He is oriented to person, place, and time. He appears well-developed and well-nourished.   HENT:  Head: Normocephalic and atraumatic.  Neck: Neck supple.  Cardiovascular: Normal rate and regular rhythm.   Pulmonary/Chest: Effort normal and breath sounds normal. He has no wheezes.  Abdominal: Soft. Bowel sounds are normal. There is no hepatosplenomegaly. There is no tenderness.  Musculoskeletal: He exhibits no edema.  Lymphadenopathy:    He has no cervical adenopathy.  Neurological: He is alert and oriented to person, place, and time.  Skin: Skin is warm and dry.  Psychiatric: He has a normal mood and affect. His behavior is normal.  Vitals reviewed.        Assessment & Plan:   Encounter Diagnoses  Name Primary?  . Essential hypertension Yes  . Orthostatic hypotension   . Syncope, unspecified syncope type   . Depression, unspecified depression type      -Gave pt rx for metoprolol to get filled locally (it on $4 list at [facility]) since it will take possibly 2 wk to come in the mail  -pt was given coupon to get midodrine locally  -Pt to call neurology as soon as he gets approved for [facility] to get rescheduled   -pt should continue with [facility] for treatment of his anxiety/depression  -pt to follow up here in 2 months.  RTO sooner prn worsening or new symptoms

## 2016-11-16 NOTE — Telephone Encounter (Signed)
No substitute should f/u with his primary for this

## 2016-11-16 NOTE — Telephone Encounter (Signed)
Patient notified and voiced understanding.

## 2016-11-16 NOTE — Telephone Encounter (Signed)
Pt unable to afford midodrine at $ 135 a month. [facility] is unable to provide pt with this medication. Please advise.

## 2016-12-05 ENCOUNTER — Telehealth: Payer: Self-pay | Admitting: Cardiovascular Disease

## 2016-12-05 MED ORDER — FLUDROCORTISONE ACETATE 0.1 MG PO TABS: 0.1000 mg | ORAL_TABLET | Freq: Two times a day (BID) | ORAL | 3 refills | Status: DC

## 2016-12-05 NOTE — Telephone Encounter (Signed)
Pt can get Florinef for 34.97 $ vs $ 89 for midodrine, pt will pick up from [facility] tomorrow

## 2016-12-05 NOTE — Telephone Encounter (Signed)
OK can you prescribe something else then, thanks

## 2016-12-05 NOTE — Telephone Encounter (Signed)
There is not a generic that I know of

## 2016-12-05 NOTE — Telephone Encounter (Signed)
Patient states that he can not afford medication he was prescribed at [facility] but states that he does not know the name of the medicine. / tg

## 2016-12-05 NOTE — Telephone Encounter (Signed)
Patient cannot afford Midodrine 10 mg TID, needs something generic

## 2016-12-05 NOTE — Telephone Encounter (Signed)
Northera and midodrine are both non generic. He can inquire on the cost of florinef .1 mg bid

## 2016-12-11 ENCOUNTER — Encounter (HOSPITAL_COMMUNITY): Payer: Self-pay | Admitting: Emergency Medicine

## 2016-12-11 DIAGNOSIS — G43A0 Cyclical vomiting, not intractable: Secondary | ICD-10-CM | POA: Insufficient documentation

## 2016-12-11 DIAGNOSIS — E7800 Pure hypercholesterolemia, unspecified: Secondary | ICD-10-CM | POA: Insufficient documentation

## 2016-12-11 DIAGNOSIS — Z7982 Long term (current) use of aspirin: Secondary | ICD-10-CM | POA: Insufficient documentation

## 2016-12-11 DIAGNOSIS — Z87891 Personal history of nicotine dependence: Secondary | ICD-10-CM | POA: Insufficient documentation

## 2016-12-11 DIAGNOSIS — I10 Essential (primary) hypertension: Secondary | ICD-10-CM | POA: Insufficient documentation

## 2016-12-11 DIAGNOSIS — E860 Dehydration: Secondary | ICD-10-CM | POA: Insufficient documentation

## 2016-12-11 DIAGNOSIS — Z79899 Other long term (current) drug therapy: Secondary | ICD-10-CM | POA: Insufficient documentation

## 2016-12-11 LAB — CBC
HCT: 40.6 % (ref 39.0–52.0)
HEMOGLOBIN: 14.6 g/dL (ref 13.0–17.0)
MCH: 35.5 pg — ABNORMAL HIGH (ref 26.0–34.0)
MCHC: 36 g/dL (ref 30.0–36.0)
MCV: 98.8 fL (ref 78.0–100.0)
Platelets: 338 10*3/uL (ref 150–400)
RBC: 4.11 MIL/uL — AB (ref 4.22–5.81)
RDW: 15.8 % — ABNORMAL HIGH (ref 11.5–15.5)
WBC: 7 10*3/uL (ref 4.0–10.5)

## 2016-12-11 LAB — COMPREHENSIVE METABOLIC PANEL
ALK PHOS: 109 U/L (ref 38–126)
ALT: <5 U/L — ABNORMAL LOW (ref 17–63)
ANION GAP: 17 — AB (ref 5–15)
AST: 169 U/L — ABNORMAL HIGH (ref 15–41)
Albumin: 3.9 g/dL (ref 3.5–5.0)
BILIRUBIN TOTAL: 1.5 mg/dL — AB (ref 0.3–1.2)
BUN: 8 mg/dL (ref 6–20)
CALCIUM: 9.7 mg/dL (ref 8.9–10.3)
CO2: 26 mmol/L (ref 22–32)
Chloride: 100 mmol/L — ABNORMAL LOW (ref 101–111)
Creatinine, Ser: 1.02 mg/dL (ref 0.61–1.24)
GLUCOSE: 148 mg/dL — AB (ref 65–99)
Potassium: 3.7 mmol/L (ref 3.5–5.1)
Sodium: 143 mmol/L (ref 135–145)
TOTAL PROTEIN: 7.5 g/dL (ref 6.5–8.1)

## 2016-12-11 LAB — LIPASE, BLOOD: Lipase: 20 U/L (ref 11–51)

## 2016-12-11 NOTE — ED Triage Notes (Signed)
Pt c/o emesis since last night. Pt denies any pain at this time.

## 2016-12-12 ENCOUNTER — Emergency Department (HOSPITAL_COMMUNITY)
Admission: EM | Admit: 2016-12-12 | Discharge: 2016-12-12 | Disposition: A | Discharge status: Home / Self Care | Payer: Self-pay | Attending: Emergency Medicine | Admitting: Emergency Medicine

## 2016-12-12 DIAGNOSIS — R1115 Cyclical vomiting syndrome unrelated to migraine: Secondary | ICD-10-CM

## 2016-12-12 DIAGNOSIS — E860 Dehydration: Secondary | ICD-10-CM

## 2016-12-12 LAB — URINALYSIS, ROUTINE W REFLEX MICROSCOPIC
Bacteria, UA: NONE SEEN
Bilirubin Urine: NEGATIVE
Glucose, UA: NEGATIVE mg/dL
Hgb urine dipstick: NEGATIVE
KETONES UR: NEGATIVE mg/dL
LEUKOCYTES UA: NEGATIVE
Nitrite: NEGATIVE
PROTEIN: 30 mg/dL — AB
RBC / HPF: NONE SEEN RBC/hpf (ref 0–5)
Specific Gravity, Urine: 1.015 (ref 1.005–1.030)
pH: 7 (ref 5.0–8.0)

## 2016-12-12 LAB — MAGNESIUM: MAGNESIUM: 1.5 mg/dL — AB (ref 1.7–2.4)

## 2016-12-12 LAB — TROPONIN I: Troponin I: <0.03 ng/mL (ref <0.03)

## 2016-12-12 MED ORDER — MAGNESIUM SULFATE 2 GM/50ML IV SOLN
2.0000 g | Freq: Once | INTRAVENOUS | Status: AC
  Administered 2016-12-12: 2 g via INTRAVENOUS
  Filled 2016-12-12: qty 50

## 2016-12-12 MED ORDER — LORAZEPAM 2 MG/ML IJ SOLN
2.0000 mg | Freq: Once | INTRAMUSCULAR | Status: AC
  Administered 2016-12-12: 2 mg via INTRAVENOUS
  Filled 2016-12-12: qty 1

## 2016-12-12 MED ORDER — SODIUM CHLORIDE 0.9 % IV BOLUS (SEPSIS)
1000.0000 mL | Freq: Once | INTRAVENOUS | Status: AC
  Administered 2016-12-12: 1000 mL via INTRAVENOUS

## 2016-12-12 NOTE — ED Notes (Signed)
Pt walked to end of nurses station without incident. "I feel good." Pt was able to keep down drink as well.

## 2016-12-12 NOTE — Discharge Instructions (Signed)

## 2016-12-12 NOTE — ED Provider Notes (Signed)
[facility] DEPT Provider Note   CSN: [id number] Arrival date & time: 12/11/16  2201     History   Chief Complaint Chief Complaint  Patient presents with  . Emesis    HPI Tristan Cook is a 56 y.o. male.  The history is provided by the patient.  Emesis   This is a new problem. The current episode started yesterday. The problem occurs 2 to 4 times per day. The problem has been gradually worsening. The emesis has an appearance of stomach contents. There has been no fever. Associated symptoms include chills and myalgias. Pertinent negatives include no abdominal pain, no diarrhea and no fever.  Pt reports he has been having worsening vomiting since yesterday No bloody vomitus No diarrhea, no bloody stools No abd pain No cp/sob He reports he used to drink ETOH frequently, but last drink was over 3 days  Ago (denies h/o ETOH withdrawal) No travel No sick contacts   Past Medical History:  Diagnosis Date  . Abdominal pain   . Adenomatous colon polyp 2008  . Arthritis   . Back pain   . Bilateral lumbar radiculopathy   . Chronic back pain   . Constipation   . Depression   . GERD (gastroesophageal reflux disease)   . Gout   . Hemorrhoids   . High cholesterol   . Hypertension   . Neck pain   . Neuropathy of both feet   . Numbness    rectal  . Numbness of legs   . Substance abuse ([place])    h/o excessive alcohol use; pt says he limits use to one to 2 beers daily he currently    Patient Active Problem List   Diagnosis Date Noted  . History of colonic polyps 06/14/2016  . Elevated blood pressure reading 06/14/2016  . Hemorrhoids 09/20/2015  . Depression 08/03/2015  . Back pain with radiation 06/15/2015  . Insomnia 06/03/2015  . Hyperlipidemia LDL goal <130 04/25/2015  . 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 Fatigue 03/29/2015  . Allergic rhinitis 02/10/2015  . Esophageal stricture 10/22/2014  . Overweight (BMI 25.0-29.9) 10/06/2013  . GERD (gastroesophageal  reflux disease) 04/23/2012  . Hemorrhoids, internal 11/02/2011  . Essential hypertension 07/25/2007  . Neck pain on left side 07/25/2007    Past Surgical History:  Procedure Laterality Date  . COLONOSCOPY  10/09/2006   3 mm pedunculated sigmoid colon polyp removed/8-mm sessile hepatic flexure polyp (tubular villous adenoma) removed/6-mm  descending colon polyp removed/small internal hemorrhoids  . COLONOSCOPY  02/28/2011   VOZ:DGUYQI, multiple in the rectum/Internal hemorrhoids, MODERATE-CAUSING RECTAL BLEEDING  . COLONOSCOPY N/A 07/05/2016   Procedure: COLONOSCOPY;  Surgeon: [name], MD;  Location: [facility];  Service: Endoscopy;  Laterality: N/A;  11:15am  . 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 Mild erosive gastrtitis most likely due to indomethacin   . FINGER SURGERY Right    4th and 5th digit  . FLEXIBLE SIGMOIDOSCOPY  01/02/2012   SLF: 1. the colonic mucosa appeared normal in the sigmoid colon 2. Large internal hemorrhoids: cause for rectal bleeding/pain . s/p banding X 3   . HAND SURGERY    . SAVORY DILATION N/A 07/21/2014   Procedure: SAVORY DILATION;  Surgeon: [name], MD;  Location: [facility];  Service: Endoscopy;  Laterality: N/A;       Home Medications    Prior to Admission medications   Medication Sig Start Date End Date Taking? Authorizing Provider  aspirin 81 MG tablet Take 1 tablet (81 mg total) by mouth daily. 10/06/13   Fayrene Helper, MD  B Complex Vitamins (VITAMIN B COMPLEX) TABS Take 1 tablet by mouth daily.     [provider]  cholecalciferol (VITAMIN D) 1000 units tablet Take 1,000 Units by mouth daily.    [provider]  diphenhydrAMINE (SOMINEX) 25 MG tablet Take 25 mg by mouth at bedtime as needed for sleep.    [provider]  docusate sodium (COLACE) 100 MG capsule Take 100 mg by mouth 2 (two) times daily.    [provider]    fludrocortisone (FLORINEF) 0.1 MG tablet Take 1 tablet (0.1 mg total) by mouth 2 (two) times daily. 12/05/16   Herminio Commons, MD  FLUoxetine (PROZAC) 20 MG tablet Take 1 tablet (20 mg total) by mouth daily. 09/12/16   Soyla Dryer, PA-C  metoprolol tartrate (LOPRESSOR) 25 MG tablet Take 0.5 tablets (12.5 mg total) by mouth 2 (two) times daily. 11/16/16 02/14/17  Soyla Dryer, PA-C  testosterone cypionate (DEPOTESTOSTERONE CYPIONATE) 200 MG/ML injection Inject 100 mg into the muscle 2 (two) times a week. 05/04/16   [provider]    Family History Family History  Problem Relation Age of Onset  . Hypertension Mother   . Hypertension Father   . Stroke Father   . Hypertension Sister   . Hypertension Brother   . Cancer Brother   . Cancer Brother        throat cancer  . Hypertension Brother   . Diabetes Maternal Grandmother   . Colon cancer Neg Hx     Social History Social History  Substance Use Topics  . Smoking status: Former Smoker    Packs/day: 1.50    Years: 33.00    Types: Cigarettes    Quit date: 08/16/2004  . Smokeless tobacco: Current User    Types: Snuff     Comment: Dips every once in a while  . Alcohol use No     Comment: drinks 6 pack of beer on weekends, 09/01/16 stopped ETOH     Allergies   Benadryl [diphenhydramine hcl (sleep)]   Review of Systems Review of Systems  Constitutional: Positive for chills. Negative for fever.  Cardiovascular: Negative for chest pain.  Gastrointestinal: Positive for vomiting. Negative for abdominal pain and diarrhea.  Musculoskeletal: Positive for myalgias.  Neurological: Negative for seizures and syncope.  All other systems reviewed and are negative.    Physical Exam Updated Vital Signs BP (!) 162/102   Pulse 91   Temp 98.2 F (36.8 C)   Resp 19   Ht 1.88 m (6\' 2" )   Wt 72.6 kg (160 lb)   SpO2 100%   BMI 20.54 kg/m   Physical Exam  CONSTITUTIONAL: Well developed/well nourished, anxious,  holding emesis bag HEAD: Normocephalic/atraumatic EYES: EOMI/PERRL ENMT: Mucous membranes dry NECK: supple no meningeal signs SPINE/BACK:entire spine nontender CV: S1/S2 noted, no murmurs/rubs/gallops noted, tachycardic LUNGS: Lungs are clear to auscultation bilaterally, no apparent distress ABDOMEN: soft, nontender, no rebound or guarding, bowel sounds noted throughout abdomen GU:no cva tenderness NEURO: Pt is awake/alert/appropriate, moves all extremitiesx4.  No facial droop.  Tremor noted EXTREMITIES: pulses normal/equal, full ROM SKIN: warm, color normal PSYCH:anxious  ED Treatments / Results  Labs (all labs ordered are listed, but only abnormal results are displayed) Labs Reviewed  COMPREHENSIVE METABOLIC PANEL - Abnormal; Notable for the following:       Result Value   Chloride 100 (*)  Glucose, Bld 148 (*)    AST 169 (*)    ALT <5 (*)    Total Bilirubin 1.5 (*)    Anion gap 17 (*)    All other components within normal limits  CBC - Abnormal; Notable for the following:    RBC 4.11 (*)    MCH 35.5 (*)    RDW 15.8 (*)    All other components within normal limits  URINALYSIS, ROUTINE W REFLEX MICROSCOPIC - Abnormal; Notable for the following:    Protein, ur 30 (*)    Squamous Epithelial / LPF 0-5 (*)    All other components within normal limits  MAGNESIUM - Abnormal; Notable for the following:    Magnesium 1.5 (*)    All other components within normal limits  LIPASE, BLOOD  TROPONIN I    EKG  EKG Interpretation  Date/Time:  Tuesday December 12 2016 00:35:25 EDT Ventricular Rate:  92 PR Interval:    QRS Duration: 89 QT Interval:  405 QTC Calculation: 502 R Axis:   69 Text Interpretation:  Sinus rhythm Probable anteroseptal infarct, old Prolonged QT interval Interpretation limited secondary to artifact Confirmed by Ripley Fraise [phone]) on 12/12/2016 12:46:27 AM       Radiology No results found.  Procedures Procedures (including critical care  time)  Medications Ordered in ED Medications  sodium chloride 0.9 % bolus 1,000 mL (0 mLs Intravenous Stopped 12/12/16 0400)  magnesium sulfate IVPB 2 g 50 mL (0 g Intravenous Stopped 12/12/16 0309)  LORazepam (ATIVAN) injection 2 mg (2 mg Intravenous Given 12/12/16 0153)     Initial Impression / Assessment and Plan / ED Course  I have reviewed the triage vital signs and the nursing notes.  Pertinent labs   results that were available during my care of the patient were reviewed by me and considered in my medical decision making (see chart for details).     2:21 AM Pt in the ED for vomiting Suspect possible ETOH withdrawal due to he admits to drinking ETOH frequently but quit cold [place] 3 days ago He is dehydrated with low magnesium, he was given IV mag and ativan Will follow closely 3:36 AM Pt is awake/alert, improving He is receiving IV fluids   4:33 AM Pt improved He is awake/alert He is ambulatory He is taking PO fluids EKG improved  EKG Interpretation  Date/Time:  Tuesday December 12 2016 04:04:39 EDT Ventricular Rate:  88 PR Interval:    QRS Duration: 74 QT Interval:  411 QTC Calculation: 498 R Axis:   51 Text Interpretation:  Sinus rhythm Probable anteroseptal infarct, old Confirmed by Ripley Fraise [phone]) on 12/12/2016 4:14:42 AM      Today his BP is elevated but usually it is lower and he has h/o syncope and is closely managed by cardiology Advised close f/u as outpatient for BP recheck Also advised to stop using ETOH He understands this and agrees with plan   Final Clinical Impressions(s) / ED Diagnoses   Final diagnoses:  Non-intractable cyclical vomiting with nausea  Dehydration    New Prescriptions New Prescriptions   No medications on file     Ripley Fraise, MD 12/12/16 [phone]

## 2016-12-13 ENCOUNTER — Ambulatory Visit: Payer: Self-pay | Admitting: Physician Assistant

## 2016-12-20 ENCOUNTER — Ambulatory Visit: Payer: Self-pay | Admitting: Urology

## 2016-12-22 NOTE — Progress Notes (Deleted)
Cardiology Office Note   Date:  12/22/2016   ID:  NECO KLING, DOB Aug 28, 1960, MRN [id number]  PCP:  Soyla Dryer, PA-C  Cardiologist:  [name], MD  No chief complaint on file.     History of Present Illness: Tristan Cook is a 56 y.o. male who presents for ongoing assessment and management of postural hypotension with syncope, history of hypertension, hypercholesterolemia, bilateral lumbar radiculopathy and chronic back pain, polysubstance abuse. On last office visit with Dr. Johnsie Cancel on 10/23/2016 the patient was started on low-dose bystolic, Midodrin, echocardiogram was ordered, and a 48 hour Holter monitor to assess average heart rate and rhythm.   He was noted that she may have to tolerate higher blood pressure to avoid symptoms. She was recommended to follow-up with neurology and psychiatry for ongoing management as well, and possible discontinuation of Prozac and changing to a different agent due to dysautonomia. She was unable to afford Midrin due to elevated cost 135 dollars month.   Echocardiogram 10/25/2016 Left ventricle: The cavity size was normal. Systolic function was   normal. The estimated ejection fraction was in the range of 55%   to 60%. Wall motion was normal; there were no regional wall   motion abnormalities. Doppler parameters are consistent with   abnormal left ventricular relaxation (grade 1 diastolic   dysfunction). - Atrial septum: No defect or patent foramen ovale was identified.  Holter Monitor 8/165/2018 Study Highlights    Predominant rhythm was sinus tachycardia. Avg HR 108 bpm. Rare PVC's and isolated PAC's.  Symptoms of "dizziness" corresponded with sinus tachycardia, HR 108-122 bpm.     Past Medical History:  Diagnosis Date  . Abdominal pain   . Adenomatous colon polyp 2008  . Arthritis   . Back pain   . Bilateral lumbar radiculopathy   . Chronic back pain   . Constipation   . Depression   . GERD (gastroesophageal reflux  disease)   . Gout   . Hemorrhoids   . High cholesterol   . Hypertension   . Neck pain   . Neuropathy of both feet   . Numbness    rectal  . Numbness of legs   . Substance abuse ([place])    h/o excessive alcohol use; pt says he limits use to one to 2 beers daily he currently    Past Surgical History:  Procedure Laterality Date  . COLONOSCOPY  10/09/2006   3 mm pedunculated sigmoid colon polyp removed/8-mm sessile hepatic flexure polyp (tubular villous adenoma) removed/6-mm  descending colon polyp removed/small internal hemorrhoids  . COLONOSCOPY  02/28/2011   [facility]:[id number], multiple in the rectum/Internal hemorrhoids, MODERATE-CAUSING RECTAL BLEEDING  . COLONOSCOPY N/A 07/05/2016   Procedure: COLONOSCOPY;  Surgeon: [name], MD;  Location: [facility];  Service: Endoscopy;  Laterality: N/A;  11:15am  . 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 Mild erosive gastrtitis most likely due to indomethacin   . FINGER SURGERY Right    4th and 5th digit  . FLEXIBLE SIGMOIDOSCOPY  01/02/2012   SLF: 1. the colonic mucosa appeared normal in the sigmoid colon 2. Large internal hemorrhoids: cause for rectal bleeding/pain . s/p banding X 3   . HAND SURGERY    . SAVORY DILATION N/A 07/21/2014   Procedure: SAVORY DILATION;  Surgeon: [name], MD;  Location: [facility];  Service: Endoscopy;  Laterality: N/A;     Current Outpatient Prescriptions  Medication Sig Dispense Refill  .  aspirin 81 MG tablet Take 1 tablet (81 mg total) by mouth daily. 30 tablet   . B Complex Vitamins (VITAMIN B COMPLEX) TABS Take 1 tablet by mouth daily.     . cholecalciferol (VITAMIN D) 1000 units tablet Take 1,000 Units by mouth daily.    . diphenhydrAMINE (SOMINEX) 25 MG tablet Take 25 mg by mouth at bedtime as needed for sleep.    [name] docusate sodium (COLACE) 100 MG capsule Take 100 mg by mouth 2 (two) times daily.    . fludrocortisone (FLORINEF) 0.1 MG  tablet Take 1 tablet (0.1 mg total) by mouth 2 (two) times daily. 60 tablet 3  . FLUoxetine (PROZAC) 20 MG tablet Take 1 tablet (20 mg total) by mouth daily. 90 tablet 1  . metoprolol tartrate (LOPRESSOR) 25 MG tablet Take 0.5 tablets (12.5 mg total) by mouth 2 (two) times daily. 30 tablet 1  . testosterone cypionate (DEPOTESTOSTERONE CYPIONATE) 200 MG/ML injection Inject 100 mg into the muscle 2 (two) times a week.  3   No current facility-administered medications for this visit.     Allergies:   Benadryl [diphenhydramine hcl (sleep)]    Social History:  The patient  reports that he quit smoking about 12 years ago. His smoking use included Cigarettes. He has a 49.50 pack-year smoking history. His smokeless tobacco use includes Snuff. He reports that he does not drink alcohol or use drugs.   Family History:  The patient's family history includes Cancer in his brother and brother; Diabetes in his maternal grandmother; Hypertension in his brother, brother, father, mother, and sister; Stroke in his father.    ROS: All other systems are reviewed and negative. Unless otherwise mentioned in H&P    PHYSICAL EXAM: VS:  There were no vitals taken for this visit. , BMI There is no height or weight on file to calculate BMI. GEN: Well nourished, well developed, in no acute distress  HEENT: normal  Neck: no JVD, carotid bruits, or masses Cardiac: ***RRR; no murmurs, rubs, or gallops,no edema  Respiratory:  clear to auscultation bilaterally, normal work of breathing GI: soft, nontender, nondistended, + BS MS: no deformity or atrophy  Skin: warm and dry, no rash Neuro:  Strength and sensation are intact Psych: euthymic mood, full affect   EKG:  EKG {ACTION; IS/IS [id number]:[id number]} ordered today. The ekg ordered today demonstrates ***   Recent Labs: 09/24/2016: TSH 1.411 12/11/2016: ALT <5; BUN 8; Creatinine, Ser 1.02; Hemoglobin 14.6; Magnesium 1.5; Platelets 338; Potassium 3.7; Sodium 143     Lipid Panel    Component Value Date/Time   CHOL 179 03/03/2016 0741   TRIG 140 03/03/2016 0741   HDL 64 03/03/2016 0741   CHOLHDL 2.8 03/03/2016 0741   VLDL 28 03/03/2016 0741   LDLCALC 87 03/03/2016 0741   LDLDIRECT 63 04/29/2012 0840      Wt Readings from Last 3 Encounters:  12/11/16 160 lb (72.6 kg)  11/16/16 164 lb (74.4 kg)  10/23/16 166 lb (75.3 kg)      Other studies Reviewed: Additional studies/ records that were reviewed today include: ***. Review of the above records demonstrates: ***   ASSESSMENT AND PLAN:  1.  ***   Current medicines are reviewed at length with the patient today.    Labs/ tests ordered today include: *** Phill Myron. [place] Pugh, ANP, AACC   12/22/2016 11:09 AM    [facility] [address] [place], [place] [address] Phone: [phone]; Fax: [phone])  [phone] 

## 2016-12-25 ENCOUNTER — Ambulatory Visit: Payer: Self-pay | Admitting: Adult Health

## 2016-12-25 ENCOUNTER — Encounter: Payer: Self-pay | Admitting: Adult Health

## 2016-12-29 ENCOUNTER — Encounter (HOSPITAL_COMMUNITY): Payer: Self-pay | Admitting: Emergency Medicine

## 2016-12-29 ENCOUNTER — Emergency Department (HOSPITAL_COMMUNITY)
Admission: EM | Admit: 2016-12-29 | Discharge: 2016-12-29 | Disposition: A | Discharge status: Home / Self Care | Payer: Self-pay | Attending: Emergency Medicine | Admitting: Emergency Medicine

## 2016-12-29 ENCOUNTER — Emergency Department (HOSPITAL_COMMUNITY): Payer: Self-pay

## 2016-12-29 DIAGNOSIS — S82831D Other fracture of upper and lower end of right fibula, subsequent encounter for closed fracture with routine healing: Secondary | ICD-10-CM | POA: Insufficient documentation

## 2016-12-29 DIAGNOSIS — M25561 Pain in right knee: Secondary | ICD-10-CM

## 2016-12-29 DIAGNOSIS — W19XXXD Unspecified fall, subsequent encounter: Secondary | ICD-10-CM | POA: Insufficient documentation

## 2016-12-29 DIAGNOSIS — Z79899 Other long term (current) drug therapy: Secondary | ICD-10-CM | POA: Insufficient documentation

## 2016-12-29 DIAGNOSIS — F17290 Nicotine dependence, other tobacco product, uncomplicated: Secondary | ICD-10-CM | POA: Insufficient documentation

## 2016-12-29 DIAGNOSIS — I10 Essential (primary) hypertension: Secondary | ICD-10-CM | POA: Insufficient documentation

## 2016-12-29 DIAGNOSIS — M25562 Pain in left knee: Secondary | ICD-10-CM

## 2016-12-29 DIAGNOSIS — Z7982 Long term (current) use of aspirin: Secondary | ICD-10-CM | POA: Insufficient documentation

## 2016-12-29 MED ORDER — HYDROCODONE-ACETAMINOPHEN 5-325 MG PO TABS: 1.0000 | ORAL_TABLET | Freq: Four times a day (QID) | ORAL | 0 refills | Status: DC | PRN

## 2016-12-29 MED ORDER — HYDROCODONE-ACETAMINOPHEN 5-325 MG PO TABS
1.0000 | ORAL_TABLET | Freq: Once | ORAL | Status: AC
  Administered 2016-12-29: 1 via ORAL
  Filled 2016-12-29: qty 1

## 2016-12-29 NOTE — Discharge Instructions (Signed)
You can take Tylenol or Ibuprofen as directed for pain. You can take the pain medication for severe breakthrough pain. As we discussed, do not drink while taking this medication as it can severely impacted your respiratory drive.  Follow-up with the referred orthopedic doctor for evaluation of osteoarthritis of her knees and for the fibula fracture noted on the x-rays.  Return the emergency Department for any worsening pain, numbness/weakness of the legs, difficulty walking or any other worsening or concerning symptoms.

## 2016-12-29 NOTE — ED Provider Notes (Signed)
[facility] EMERGENCY DEPARTMENT Provider Note   CSN: [id number] Arrival date & time: 12/29/16  1303     History   Chief Complaint Chief Complaint  Patient presents with  . Knee Pain    HPI Tristan Cook is a 56 y.o. male who presents with bilateral knee pain 3 days. Patient reports that the pain was gradual and progressively worsened over the last 3 days. He denies any preceding trauma, injury, fall. He reports that he has been able to ambulate but reports this pain is worsened with ambulation and bearing weight. He normally walks with the assistance of a cane and denies any changes in ambulation status. Patient does report that he had one fall yesterday where he landed on his knees because the pain was so severe. He reports patient has neuropathy to both bilateral lower extremities states that he occasionally experiences numbness but denies any new numbness or weakness of legs.He denies any warmth, swelling, erythema noted to the bilateral knees. Patient denies any pre-existing knee conditions, though he says he had a fall roughly 4 months ago. He should denies any fevers, chills, numbness/weakness of the legs.  The history is provided by the patient.    Past Medical History:  Diagnosis Date  . Abdominal pain   . Adenomatous colon polyp 2008  . Arthritis   . Back pain   . Bilateral lumbar radiculopathy   . Chronic back pain   . Constipation   . Depression   . GERD (gastroesophageal reflux disease)   . Gout   . Hemorrhoids   . High cholesterol   . Hypertension   . Neck pain   . Neuropathy of both feet   . Numbness    rectal  . Numbness of legs   . Substance abuse ([place])    h/o excessive alcohol use; pt says he limits use to one to 2 beers daily he currently    Patient Active Problem List   Diagnosis Date Noted  . History of colonic polyps 06/14/2016  . Elevated blood pressure reading 06/14/2016  . Hemorrhoids 09/20/2015  . Depression 08/03/2015  . Back pain  with radiation 06/15/2015  . Insomnia 06/03/2015  . Hyperlipidemia LDL goal <130 04/25/2015  . 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 Fatigue 03/29/2015  . Allergic rhinitis 02/10/2015  . Esophageal stricture 10/22/2014  . Overweight (BMI 25.0-29.9) 10/06/2013  . GERD (gastroesophageal reflux disease) 04/23/2012  . Hemorrhoids, internal 11/02/2011  . Essential hypertension 07/25/2007  . Neck pain on left side 07/25/2007    Past Surgical History:  Procedure Laterality Date  . COLONOSCOPY  10/09/2006   3 mm pedunculated sigmoid colon polyp removed/8-mm sessile hepatic flexure polyp (tubular villous adenoma) removed/6-mm  descending colon polyp removed/small internal hemorrhoids  . COLONOSCOPY  02/28/2011   HEN:IDPOEU, multiple in the rectum/Internal hemorrhoids, MODERATE-CAUSING RECTAL BLEEDING  . COLONOSCOPY N/A 07/05/2016   Procedure: COLONOSCOPY;  Surgeon: [name], MD;  Location: [facility] SUITE;  Service: Endoscopy;  Laterality: N/A;  11:15am  . 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 Mild erosive gastrtitis most likely due to indomethacin   . FINGER SURGERY Right    4th and 5th digit  . FLEXIBLE SIGMOIDOSCOPY  01/02/2012   SLF: 1. the colonic mucosa appeared normal in the sigmoid colon 2. Large internal hemorrhoids: cause for rectal bleeding/pain . s/p banding X 3   . HAND SURGERY    . SAVORY DILATION N/A 07/21/2014   Procedure: SAVORY  DILATION;  Surgeon: [name], MD;  Location: [facility] SUITE;  Service: Endoscopy;  Laterality: N/A;       Home Medications    Prior to Admission medications   Medication Sig Start Date End Date Taking? Authorizing Provider  aspirin 81 MG tablet Take 1 tablet (81 mg total) by mouth daily. 10/06/13   Fayrene Helper, MD  B Complex Vitamins (VITAMIN B COMPLEX) TABS Take 1 tablet by mouth daily.     [provider]  cholecalciferol (VITAMIN D) 1000 units tablet Take  1,000 Units by mouth daily.    [provider]  diphenhydrAMINE (SOMINEX) 25 MG tablet Take 25 mg by mouth at bedtime as needed for sleep.    [provider]  docusate sodium (COLACE) 100 MG capsule Take 100 mg by mouth 2 (two) times daily.    [provider]  fludrocortisone (FLORINEF) 0.1 MG tablet Take 1 tablet (0.1 mg total) by mouth 2 (two) times daily. 12/05/16   Herminio Commons, MD  FLUoxetine (PROZAC) 20 MG tablet Take 1 tablet (20 mg total) by mouth daily. 09/12/16   Soyla Dryer, PA-C  HYDROcodone-acetaminophen (NORCO/VICODIN) 5-325 MG tablet Take 1-2 tablets by mouth every 6 (six) hours as needed. 12/29/16   Volanda Napoleon, PA-C  metoprolol tartrate (LOPRESSOR) 25 MG tablet Take 0.5 tablets (12.5 mg total) by mouth 2 (two) times daily. 11/16/16 02/14/17  Soyla Dryer, PA-C  testosterone cypionate (DEPOTESTOSTERONE CYPIONATE) 200 MG/ML injection Inject 100 mg into the muscle 2 (two) times a week. 05/04/16   [provider]    Family History Family History  Problem Relation Age of Onset  . Hypertension Mother   . Hypertension Father   . Stroke Father   . Hypertension Sister   . Hypertension Brother   . Cancer Brother   . Cancer Brother        throat cancer  . Hypertension Brother   . Diabetes Maternal Grandmother   . Colon cancer Neg Hx     Social History Social History  Substance Use Topics  . Smoking status: Former Smoker    Packs/day: 1.50    Years: 33.00    Types: Cigarettes    Quit date: 08/16/2004  . Smokeless tobacco: Current User    Types: Snuff     Comment: Dips every once in a while  . Alcohol use 3.6 oz/week    6 Cans of beer per week     Comment: drinks 6 pack of beer on weekends, 09/01/16 stopped ETOH     Allergies   Benadryl [diphenhydramine hcl (sleep)]   Review of Systems Review of Systems  Musculoskeletal:       Bilateral knee pain  Neurological: Negative for weakness and numbness.     Physical  Exam Updated Vital Signs BP (!) 167/103 (BP Location: Right Arm)   Pulse 88   Temp 97.6 F (36.4 C) (Oral)   Resp 18   Ht 6\' 2"  (1.88 m)   Wt 74.8 kg (165 lb)   SpO2 100%   BMI 21.18 kg/m   Physical Exam  Constitutional: He appears well-developed and well-nourished.  Appears uncomfortable but no acute distress   HENT:  Head: Normocephalic and atraumatic.  Eyes: Pupils are equal, round, and reactive to light. Conjunctivae and EOM are normal. Right eye exhibits no discharge. Left eye exhibits no discharge. No scleral icterus.  Pulmonary/Chest: Effort normal.  Musculoskeletal:  Tenderness palpation noted to the anterior aspect of the right knee  that extends to the proximal tib/fib. No deformity or crepitus noted. Negative posterior and anterior drawer test. No varus or valgus instability. Mild overlying soft tissue swelling noted to the right knee. No warmth, erythema, ecchymosis.Tenderness palpation to the anterior aspect of the left knee. Negative anterior and posterior drawer test. No varus or valgus instability. No soft tissue swelling, erythema, warmth. Flexion/extension of bilateral knees intact without difficulty.  Neurological: He is alert.  5/5 strength BLE  Skin: Skin is warm and dry.  Psychiatric: He has a normal mood and affect. His speech is normal and behavior is normal.  Nursing note and vitals reviewed.    ED Treatments / Results  Labs (all labs ordered are listed, but only abnormal results are displayed) Labs Reviewed - No data to display  EKG  EKG Interpretation None       Radiology Dg Knee Complete 4 Views Left  Result Date: 12/29/2016 CLINICAL DATA:  Bilateral knee pain. EXAM: LEFT KNEE - COMPLETE 4+ VIEW COMPARISON:  None. FINDINGS: No evidence of fracture, dislocation, or joint effusion. There is a small osteophyte along the lateral aspect of the left lateral tibial plateau. Joint spaces are otherwise normal with no evidence of arthropathy or other  focal bone abnormality. Soft tissues are unremarkable. IMPRESSION: Minimal lateral compartment osteoarthrosis of the left knee without acute abnormality. Electronically Signed   By: Ulyses Jarred M.D.   On: 12/29/2016 13:45   Dg Knee Complete 4 Views Right  Result Date: 12/29/2016 CLINICAL DATA:  Pain for 3 days without trauma. EXAM: RIGHT KNEE - COMPLETE 4+ VIEW COMPARISON:  MRI of 04/10/2012 FINDINGS: Callus deposition about the proximal right fibula is incompletely imaged but suggests subacute to chronic unhealed fracture. Vascular calcifications. Mild 3 compartment osteoarthritis, with joint space narrowing and osteophyte formation. Probable small suprapatellar joint effusion. IMPRESSION: Three compartment osteoarthritis with probable small suprapatellar joint effusion. Nonacute proximal fibular fracture with fracture line still apparent. Electronically Signed   By: Abigail Miyamoto M.D.   On: 12/29/2016 13:57    Procedures Procedures (including critical care time)  Medications Ordered in ED Medications  HYDROcodone-acetaminophen (NORCO/VICODIN) 5-325 MG per tablet 1 tablet (1 tablet Oral Given 12/29/16 1536)     Initial Impression / Assessment and Plan / ED Course  I have reviewed the triage vital signs and the nursing notes.  Pertinent labs & imaging results that were available during my care of the patient were reviewed by me and considered in my medical decision making (see chart for details).     56 year old male who presents with bilateral knee pain that has been ongoing for the last 3 days. No preceding trauma, fall, injury. He does report one fall partly 4 months ago but states that he has been able to tolerate since then. Appears uncomfortable but no acute distress. Vital signs reviewed. Patient initially slightly tachycardic and hypertensive. He states that he did not take his blood pressure medications this morning because "I didn't feel like waking up taking them." likely also  elevated secondary to pain. Consider osteoarthritis versus chronic knee pain versus fracture versus dislocation versus sprain. Initial x-rays ordered at triage. Analgesics provided in the department.  X-rays reviewed. Left knee x-ray shows evidence of osteoarthritis with no fracture, dislocation or effusion. Right knee x-ray shows 3 compartment osteoarthritis with a small joint effusion. There is also mention of a nonacute proximal fibular fracture. I personally reviewed the x-rays and there appears to be a well-healing fibular fracture with surrounding calcification. Does not  appear acute. Discussed patient with Dr. Winfred Leeds. Given apparent chronic nature of fracture and patient's ability to ambulate and bear weight, does not need splinting. Discussed results with patient. He does not recall a specific injury but does state that he'll members falling naproxen 4 months ago. We'll plan to provide outpatient orthopedic referral for patient to follow-up with. We'll plan to give a very short course of pain medication for symptomatic relief. Encourage patient not to drink any alcohol while taking medications. Will also provide a knee sleeve for the left knee for support and stabilization. Plan to provide outpatient orthopedic referral for further follow-up and evaluation. Strict return precautions discussed. Patient expresses understanding and agreement to plan.    Final Clinical Impressions(s) / ED Diagnoses   Final diagnoses:  Acute pain of both knees  Closed fracture of proximal end of right fibula with routine healing, unspecified fracture morphology, subsequent encounter    New Prescriptions Discharge Medication List as of 12/29/2016  3:54 PM    START taking these medications   Details  HYDROcodone-acetaminophen (NORCO/VICODIN) 5-325 MG tablet Take 1-2 tablets by mouth every 6 (six) hours as needed., Starting Fri 12/29/2016, Print         Volanda Napoleon, PA-C 12/29/16 1625      Orlie Dakin, MD 12/29/16 1626

## 2016-12-29 NOTE — ED Triage Notes (Signed)
Patient complaining of bilateral knee pain x 3 days. Denies injury.

## 2017-01-04 ENCOUNTER — Ambulatory Visit (INDEPENDENT_AMBULATORY_CARE_PROVIDER_SITE_OTHER): Payer: Self-pay | Admitting: Neurology

## 2017-01-04 ENCOUNTER — Telehealth: Payer: Self-pay | Admitting: Neurology

## 2017-01-04 DIAGNOSIS — R200 Anesthesia of skin: Secondary | ICD-10-CM

## 2017-01-04 DIAGNOSIS — Z0289 Encounter for other administrative examinations: Secondary | ICD-10-CM

## 2017-01-04 DIAGNOSIS — G629 Polyneuropathy, unspecified: Secondary | ICD-10-CM

## 2017-01-04 DIAGNOSIS — R270 Ataxia, unspecified: Secondary | ICD-10-CM

## 2017-01-04 DIAGNOSIS — G621 Alcoholic polyneuropathy: Secondary | ICD-10-CM

## 2017-01-04 DIAGNOSIS — R202 Paresthesia of skin: Secondary | ICD-10-CM

## 2017-01-04 DIAGNOSIS — R531 Weakness: Secondary | ICD-10-CM

## 2017-01-04 NOTE — Telephone Encounter (Signed)
Attempted to call the patient back. No answer LVM for the patient to call back.

## 2017-01-04 NOTE — Telephone Encounter (Signed)
Pt request MRI results from 7/18.

## 2017-01-04 NOTE — Progress Notes (Signed)
See procedure note.

## 2017-01-04 NOTE — Telephone Encounter (Signed)
When patient returns call she would just like to inform the patient that the MRI in July didn't show anything acute that would explain his symptoms. She states that there was a blood vessel that was clogged and she would like to have him come in for a apt to discuss further. We had an apt originally that he had no showed where we were going to discuss

## 2017-01-08 DIAGNOSIS — Z139 Encounter for screening, unspecified: Secondary | ICD-10-CM

## 2017-01-08 NOTE — Procedures (Signed)
Full Name: Tristan Cook Gender: Male MRN #: [id number] Date of Birth: May 07, 2060    Visit Date: 01/04/17 08:14 Age: 56 Years 53 Months Old Examining Physician: Sarina Ill, MD  Referring Physician: Jaynee Eagles, MD  History:  56 y.o. male here as a referral from Dr. Roslynn Amble for neuropathy. 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 Patient has progressive weakness which is more proximal in the upper extremities and proximal in the lower extremities with significantly impaired sensation in the arms and legs. Patient has known neuropathy, EMG nerve conduction study ordered to evaluate for weakness and rule out myopathy or myositis, neuromuscular disorder or other etiologies.  Summary: The right median motor nerve showed delayed distal onset latency (4.6 ms, N< 4.4). The right Ulnar motor nerve showed delayed distal onset latency (3.6 ms, N< 3.3). The left Peroneal motor nerve showed delayed distal onset latency (7.7 ms, N< 6.5) and reduced amplitude (1.8 mV, N>2). The left Tibial motor nerve showed delayed distal onset latency (6.0 ms, N< 5.8) and reduced amplitude (3.6 mV, N>4). The right Tibial motor nerve showed delayed distal onset latency (5.9 ms, N< 5.8) and reduced amplitude (2.3 mV, N>4). The right radial sensory nerve showed delayed distal peak latency (3.8 ms, N<2.9). The right sural, right superficial peroneal and left superficial peroneal sensory nerves showed no response. The right median orthodromic sensory nerve showed delayed distal peak latency (4.2 ms, N<3.4) and reduced amplitude (3 V, N>10). The right ulnar orthodromic sensory nerve showed delayed distal peak latency (4.2 ms, N<3.1) and reduced amplitude (3 V, N>5). The left tibial F-wave showed delayed latency (68.3 ms, N<56). The right tibial F-wave showed  delayed latency (69 ms, N<56). The right ulnar F-wave showed delayed latency (35.5 ms, N<32). All muscles were within normal limits.     Conclusion: There is electrophysiologic evidence of moderately severe, axonal, length-dependent, distal polyneuropathy. No suggestion of CTS, myopathy, myositis, neuromuscular disease or radiculopathy.   Sarina Ill M.D.  [facility] [place], [place] [address] Tel: [phone] Fax: [phone]        [facility]    Nerve / Sites Muscle Latency Ref. Amplitude Ref. Rel Amp Segments Distance Velocity Ref. Area    ms ms mV mV %  cm m/s m/s mVms  R Median - APB     Wrist APB 4.6 ?4.4 12.1 ?4.0 100 Wrist - APB 7   62.7     Upper arm APB 10.1  11.6  95.5 Upper arm - Wrist 27 50 ?49 60.2  R Ulnar - ADM     Wrist ADM 3.6 ?3.3 12.5 ?6.0 100 Wrist - ADM 7   53.7     B.Elbow ADM 8.4  10.9  87.7 B.Elbow - Wrist 22 46 ?49 52.0     A.Elbow ADM 11.3  10.3  94.5 A.Elbow - B.Elbow 12 41 ?49 50.1         A.Elbow - Wrist      L Peroneal - EDB     Ankle EDB 7.7 ?6.5 1.8 ?2.0 100 Ankle - EDB 9   8.3     Fib head EDB 18.3  1.5  88.2 Fib head - Ankle 34 32 ?44 6.1     Pop fossa EDB 20.4  1.4  87.5 Pop fossa - Fib head 8 37 ?44 6.8  Pop fossa - Ankle      L Tibial - AH     Ankle AH 6.0 ?5.8 3.6 ?4.0 100 Ankle - AH 9   16.6     Pop fossa AH 18.6  3.6  101 Pop fossa - Ankle 40 32 ?41 15.4  R Tibial - AH     Ankle AH 5.9 ?5.8 2.3 ?4.0 100 Ankle - AH 9   9.5     Pop fossa AH 17.7  2.9  125 Pop fossa - Ankle 40 34 ?41 8.1                 SNC    Nerve / Sites Rec. Site Peak Lat Ref.  Amp Ref. Segments Distance Peak Diff Ref.    ms ms V V  cm ms ms  R Radial - Anatomical snuff box (Forearm)     Forearm Wrist 3.8 ?2.9 3 ?15 Forearm - Wrist 10    R Sural - Ankle (Calf)     Calf Ankle NR ?4.4 NR ?6 Calf - Ankle 14    R Superficial peroneal - Ankle     Lat leg Ankle NR ?4.4 NR ?6 Lat leg - Ankle 14    L Superficial peroneal - Ankle      Lat leg Ankle NR ?4.4 NR ?6 Lat leg - Ankle 14    R Median, Ulnar - Transcarpal comparison     Median Palm Wrist 2.8 ?2.2 6 ?35 Median Palm - Wrist 8       Ulnar Palm Wrist 2.7 ?2.2 3 ?12 Ulnar Palm - Wrist 8          Median Palm - Ulnar Palm  0.2 ?0.4  R Median - Orthodromic (Dig II, Mid palm)     Dig II Wrist 4.2 ?3.4 3 ?10 Dig II - Wrist 13    R Ulnar - Orthodromic, (Dig V, Mid palm)     Dig V Wrist 4.2 ?3.1 3 ?5 Dig V - Wrist 43                       F  Wave    Nerve F Lat Ref.   ms ms  L Tibial - AH 68.3 ?56.0  R Tibial - AH 69.0 ?56.0  R Ulnar - ADM 35.5 ?32.0           H Reflex    Nerve H Lat Lat Hmax   ms ms   Left Right Ref. Left Right Ref.  Tibial - [place] 41.5 41.2 ?35.0 42.7 41.6 ?35.0         EMG full       EMG Summary Table    Spontaneous MUAP Recruitment  Muscle IA Fib PSW Fasc Other Amp Dur. Poly Pattern  R. Biceps brachii Normal None None None _______ Normal Normal Normal Normal  R. Deltoid Normal None None None _______ Normal Normal Normal Normal  R. Triceps brachii Normal None None None _______ Normal Normal Normal Normal  R. Pronator teres Normal None None None _______ Normal Normal Normal Normal  R. First dorsal interosseous Normal None None None _______ Normal Normal Normal Normal  R. Iliopsoas Normal None None None _______ Normal Normal Normal Normal  R. Vastus lateralis Normal None None None _______ Normal Normal Normal Normal  R. Vastus medialis Normal None None None _______ Normal Normal Normal Normal  R. Tibialis anterior Normal None None None _______ Normal Normal Normal Normal  R. Gastrocnemius (  Medial head) Normal None None None _______ Normal Normal Normal Normal  R. Thoracic paraspinals (mid) Normal None None None _______ Normal Normal Normal Normal

## 2017-01-08 NOTE — Telephone Encounter (Signed)
Called patient back explained what Dr Jaynee Eagles stated about the results of his MRI in July. I informed that we had an apt that was scheduled for him but he didn't show. The patient is asking that he is seen before 11/11 if possible. I informed him that I would let her nurse know so that she can see if there is something that could be offered but that Dr Jaynee Eagles was booked out until the end of November. I informed him that we could also place him on the wait list in the event something comes up. Pt verbalized understanding. Pt had no questions at this time but was encouraged to call back if questions arise.

## 2017-01-08 NOTE — Congregational Nurse Program (Signed)
Congregational Nurse Program Note  Date of Encounter: 01/08/2017  Past Medical History: Past Medical History:  Diagnosis Date  . Abdominal pain   . Adenomatous colon polyp 2008  . Arthritis   . Back pain   . Bilateral lumbar radiculopathy   . Chronic back pain   . Constipation   . Depression   . GERD (gastroesophageal reflux disease)   . Gout   . Hemorrhoids   . High cholesterol   . Hypertension   . Neck pain   . Neuropathy of both feet   . Numbness    rectal  . Numbness of legs   . Substance abuse ([place])    h/o excessive alcohol use; pt says he limits use to one to 2 beers daily he currently    Encounter Details:     CNP Questionnaire - 01/08/17 0945      Patient Demographics   Is this a new or existing patient? New   Patient is considered a/an Not Applicable   Race African-American/Black     Patient Assistance   Location of Patient [place]   Patient's financial/insurance status Self-Pay (Uninsured)   Uninsured Patient ([place] [employer] Connects) Yes   Interventions Not Applicable   Patient referred to apply for the following financial assistance Not Applicable   Food insecurities addressed Not Applicable   Transportation assistance No   Assistance securing medications No   Educational health offerings Nutrition     Encounter Details   Primary purpose of visit Acute Illness/Condition Visit   Was an Emergency Department visit averted? No   Does patient have a medical provider? Yes   Patient referred to Not Applicable   Was a mental health screening completed? (GAINS tool) No   Does patient have dental issues? Yes   Was a dental referral made? No   Does patient have vision issues? Yes   Does your patient have an abnormal blood pressure today? No   Since previous encounter, have you referred patient for abnormal blood pressure that resulted in a new diagnosis or medication change? No   Does your patient have an abnormal blood glucose  today? No   Since previous encounter, have you referred patient for abnormal blood glucose that resulted in a new diagnosis or medication change? No   Was there a life-saving intervention made? No     [facility] Pt came in today here at [facility] wanting his flu vaccine.  Temperature was 98.3/oral   Flu vaccine info:  Lot# [id number] NDC: [id number] MFG: (IIV) [employer] Fluarix 2018-19 Expires: 09/09/17  Vaccine given in left deltoid.    Pt was kept for 15 minutes after vaccine was administered. Presented no signs of an allergic reaction.      Alfred Eckley R. Tatijana Bierly LPN

## 2017-01-08 NOTE — Progress Notes (Signed)
Full Name: Tristan Cook Gender: Male MRN #: [id number] Date of Birth: [birth year]    Visit Date: 01/04/17 08:14 Age: 56 Years 56 Months Old Examining Physician: Sarina Ill, MD  Referring Physician: Jaynee Eagles, MD  History:  56 y.o. male here as a referral from Dr. Roslynn Amble for neuropathy. 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 Patient has progressive weakness which is more proximal in the upper extremities and proximal in the lower extremities with significantly impaired sensation in the arms and legs. Patient has known neuropathy, EMG nerve conduction study ordered to evaluate for weakness and rule out myopathy or myositis, neuromuscular disorder or other etiologies.  Summary: The right median motor nerve showed delayed distal onset latency (4.6 ms, N< 4.4). The right Ulnar motor nerve showed delayed distal onset latency (3.6 ms, N< 3.3). The left Peroneal motor nerve showed delayed distal onset latency (7.7 ms, N< 6.5) and reduced amplitude (1.8 mV, N>2). The left Tibial motor nerve showed delayed distal onset latency (6.0 ms, N< 5.8) and reduced amplitude (3.6 mV, N>4). The right Tibial motor nerve showed delayed distal onset latency (5.9 ms, N< 5.8) and reduced amplitude (2.3 mV, N>4). The right radial sensory nerve showed delayed distal peak latency (3.8 ms, N<2.9). The right sural, right superficial peroneal and left superficial peroneal sensory nerves showed no response. The right median orthodromic sensory nerve showed delayed distal peak latency (4.2 ms, N<3.4) and reduced amplitude (3 V, N>10). The right ulnar orthodromic sensory nerve showed delayed distal peak latency (4.2 ms, N<3.1) and reduced amplitude (3 V, N>5). The left tibial F-wave showed delayed latency (68.3 ms, N<56). The right tibial F-wave showed  delayed latency (69 ms, N<56). The right ulnar F-wave showed delayed latency (35.5 ms, N<32). All muscles were within normal limits.     Conclusion: There is electrophysiologic evidence of moderately severe, axonal, length-dependent, distal polyneuropathy. No suggestion of CTS, myopathy, myositis, neuromuscular disease or radiculopathy.   Sarina Ill M.D.  [facility] [place], [place] [address] Tel: [phone] Fax: [phone]        [facility]    Nerve / Sites Muscle Latency Ref. Amplitude Ref. Rel Amp Segments Distance Velocity Ref. Area    ms ms mV mV %  cm m/s m/s mVms  R Median - APB     Wrist APB 4.6 ?4.4 12.1 ?4.0 100 Wrist - APB 7   62.7     Upper arm APB 10.1  11.6  95.5 Upper arm - Wrist 27 50 ?49 60.2  R Ulnar - ADM     Wrist ADM 3.6 ?3.3 12.5 ?6.0 100 Wrist - ADM 7   53.7     B.Elbow ADM 8.4  10.9  87.7 B.Elbow - Wrist 22 46 ?49 52.0     A.Elbow ADM 11.3  10.3  94.5 A.Elbow - B.Elbow 12 41 ?49 50.1         A.Elbow - Wrist      L Peroneal - EDB     Ankle EDB 7.7 ?6.5 1.8 ?2.0 100 Ankle - EDB 9   8.3     Fib head EDB 18.3  1.5  88.2 Fib head - Ankle 34 32 ?44 6.1     Pop fossa EDB 20.4  1.4  87.5 Pop fossa - Fib head 8 37 ?44 6.8  Pop fossa - Ankle      L Tibial - AH     Ankle AH 6.0 ?5.8 3.6 ?4.0 100 Ankle - AH 9   16.6     Pop fossa AH 18.6  3.6  101 Pop fossa - Ankle 40 32 ?41 15.4  R Tibial - AH     Ankle AH 5.9 ?5.8 2.3 ?4.0 100 Ankle - AH 9   9.5     Pop fossa AH 17.7  2.9  125 Pop fossa - Ankle 40 34 ?41 8.1                 SNC    Nerve / Sites Rec. Site Peak Lat Ref.  Amp Ref. Segments Distance Peak Diff Ref.    ms ms V V  cm ms ms  R Radial - Anatomical snuff box (Forearm)     Forearm Wrist 3.8 ?2.9 3 ?15 Forearm - Wrist 10    R Sural - Ankle (Calf)     Calf Ankle NR ?4.4 NR ?6 Calf - Ankle 14    R Superficial peroneal - Ankle     Lat leg Ankle NR ?4.4 NR ?6 Lat leg - Ankle 14    L Superficial peroneal - Ankle      Lat leg Ankle NR ?4.4 NR ?6 Lat leg - Ankle 14    R Median, Ulnar - Transcarpal comparison     Median Palm Wrist 2.8 ?2.2 6 ?35 Median Palm - Wrist 8       Ulnar Palm Wrist 2.7 ?2.2 3 ?12 Ulnar Palm - Wrist 8          Median Palm - Ulnar Palm  0.2 ?0.4  R Median - Orthodromic (Dig II, Mid palm)     Dig II Wrist 4.2 ?3.4 3 ?10 Dig II - Wrist 13    R Ulnar - Orthodromic, (Dig V, Mid palm)     Dig V Wrist 4.2 ?3.1 3 ?5 Dig V - Wrist 74                       F  Wave    Nerve F Lat Ref.   ms ms  L Tibial - AH 68.3 ?56.0  R Tibial - AH 69.0 ?56.0  R Ulnar - ADM 35.5 ?32.0           H Reflex    Nerve H Lat Lat Hmax   ms ms   Left Right Ref. Left Right Ref.  Tibial - [place] 41.5 41.2 ?35.0 42.7 41.6 ?35.0         EMG full       EMG Summary Table    Spontaneous MUAP Recruitment  Muscle IA Fib PSW Fasc Other Amp Dur. Poly Pattern  R. Biceps brachii Normal None None None _______ Normal Normal Normal Normal  R. Deltoid Normal None None None _______ Normal Normal Normal Normal  R. Triceps brachii Normal None None None _______ Normal Normal Normal Normal  R. Pronator teres Normal None None None _______ Normal Normal Normal Normal  R. First dorsal interosseous Normal None None None _______ Normal Normal Normal Normal  R. Iliopsoas Normal None None None _______ Normal Normal Normal Normal  R. Vastus lateralis Normal None None None _______ Normal Normal Normal Normal  R. Vastus medialis Normal None None None _______ Normal Normal Normal Normal  R. Tibialis anterior Normal None None None _______ Normal Normal Normal Normal  R. Gastrocnemius (  Medial head) Normal None None None _______ Normal Normal Normal Normal  R. Thoracic paraspinals (mid) Normal None None None _______ Normal Normal Normal Normal

## 2017-01-08 NOTE — Telephone Encounter (Signed)
Pt has called to speak with RN Myriam Jacobson who was not available at time of call.  Pt is asking for a call back

## 2017-01-09 NOTE — Telephone Encounter (Signed)
Called pt back. He wants to make an appt to discuss MRI and where to go from here. I scheduled him in an opening available on November 7th @ 09:30 with an arrival time of 09:00. He verbalized understanding.

## 2017-01-16 ENCOUNTER — Ambulatory Visit: Payer: Self-pay | Admitting: Physician Assistant

## 2017-01-17 ENCOUNTER — Ambulatory Visit (INDEPENDENT_AMBULATORY_CARE_PROVIDER_SITE_OTHER): Payer: Self-pay | Admitting: Neurology

## 2017-01-17 ENCOUNTER — Encounter: Payer: Self-pay | Admitting: Neurology

## 2017-01-17 VITALS — BP 152/100 | HR 87 | Wt 167.8 lb

## 2017-01-17 DIAGNOSIS — E538 Deficiency of other specified B group vitamins: Secondary | ICD-10-CM

## 2017-01-17 DIAGNOSIS — R202 Paresthesia of skin: Secondary | ICD-10-CM

## 2017-01-17 DIAGNOSIS — R200 Anesthesia of skin: Secondary | ICD-10-CM

## 2017-01-17 DIAGNOSIS — R270 Ataxia, unspecified: Secondary | ICD-10-CM

## 2017-01-17 DIAGNOSIS — R531 Weakness: Secondary | ICD-10-CM

## 2017-01-17 DIAGNOSIS — M5416 Radiculopathy, lumbar region: Secondary | ICD-10-CM

## 2017-01-17 DIAGNOSIS — I639 Cerebral infarction, unspecified: Secondary | ICD-10-CM | POA: Insufficient documentation

## 2017-01-17 DIAGNOSIS — G621 Alcoholic polyneuropathy: Secondary | ICD-10-CM

## 2017-01-17 DIAGNOSIS — G63 Polyneuropathy in diseases classified elsewhere: Secondary | ICD-10-CM

## 2017-01-17 DIAGNOSIS — H539 Unspecified visual disturbance: Secondary | ICD-10-CM

## 2017-01-17 MED ORDER — ASPIRIN EC 325 MG PO TBEC: 325.0000 mg | DELAYED_RELEASE_TABLET | Freq: Every day | ORAL | 0 refills | Status: DC

## 2017-01-17 MED ORDER — TRAMADOL HCL 50 MG PO TABS: 50.0000 mg | ORAL_TABLET | Freq: Every evening | ORAL | 1 refills | Status: DC | PRN

## 2017-01-17 NOTE — Addendum Note (Signed)
Addended by: Inis Sizer D on: 01/17/2017 10:22 AM   Modules accepted: Orders
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
## 2017-01-17 NOTE — Patient Instructions (Signed)
Tramadol tablets What is this medicine? TRAMADOL (TRA ma dole) is a pain reliever. It is used to treat moderate to severe pain in adults. This medicine may be used for other purposes; ask your health care provider or pharmacist if you have questions. COMMON BRAND NAME(S): Ultram What should I tell my health care provider before I take this medicine? They need to know if you have any of these conditions: -brain tumor -depression -drug abuse or addiction -head injury -if you frequently drink alcohol containing drinks -kidney disease or trouble passing urine -liver disease -lung disease, asthma, or breathing problems -seizures or epilepsy -suicidal thoughts, plans, or attempt; a previous suicide attempt by you or a family member -an unusual or allergic reaction to tramadol, codeine, other medicines, foods, dyes, or preservatives -pregnant or trying to get pregnant -breast-feeding How should I use this medicine? Take this medicine by mouth with a full glass of water. Follow the directions on the prescription label. You can take it with or without food. If it upsets your stomach, take it with food. Do not take your medicine more often than directed. A special MedGuide will be given to you by the pharmacist with each prescription and refill. Be sure to read this information carefully each time. Talk to your pediatrician regarding the use of this medicine in children. Special care may be needed. Overdosage: If you think you have taken too much of this medicine contact a poison control center or emergency room at once. NOTE: This medicine is only for you. Do not share this medicine with others. What if I miss a dose? If you miss a dose, take it as soon as you can. If it is almost time for your next dose, take only that dose. Do not take double or extra doses. What may interact with this medicine? Do not take this medication with any of the following medicines: -MAOIs like Carbex, Eldepryl,  Marplan, Nardil, and Parnate This medicine may also interact with the following medications: -alcohol -antihistamines for allergy, cough and cold -certain medicines for anxiety or sleep -certain medicines for depression like amitriptyline, fluoxetine, sertraline -certain medicines for migraine headache like almotriptan, eletriptan, frovatriptan, naratriptan, rizatriptan, sumatriptan, zolmitriptan -certain medicines for seizures like carbamazepine, oxcarbazepine, phenobarbital, primidone -certain medicines that treat or prevent blood clots like warfarin -digoxin -furazolidone -general anesthetics like halothane, isoflurane, methoxyflurane, propofol -linezolid -local anesthetics like lidocaine, pramoxine, tetracaine -medicines that relax muscles for surgery -other narcotic medicines for pain or cough -phenothiazines like chlorpromazine, mesoridazine, prochlorperazine, thioridazine -procarbazine This list may not describe all possible interactions. Give your health care provider a list of all the medicines, herbs, non-prescription drugs, or dietary supplements you use. Also tell them if you smoke, drink alcohol, or use illegal drugs. Some items may interact with your medicine. What should I watch for while using this medicine? Tell your doctor or health care professional if your pain does not go away, if it gets worse, or if you have new or a different type of pain. You may develop tolerance to the medicine. Tolerance means that you will need a higher dose of the medicine for pain relief. Tolerance is normal and is expected if you take this medicine for a long time. Do not suddenly stop taking your medicine because you may develop a severe reaction. Your body becomes used to the medicine. This does NOT mean you are addicted. Addiction is a behavior related to getting and using a drug for a non-medical reason. If you have pain, you  have a medical reason to take pain medicine. Your doctor will tell  you how much medicine to take. If your doctor wants you to stop the medicine, the dose will be slowly lowered over time to avoid any side effects. There are different types of narcotic medicines (opiates). If you take more than one type at the same time or if you are taking another medicine that also causes drowsiness, you may have more side effects. Give your health care provider a list of all medicines you use. Your doctor will tell you how much medicine to take. Do not take more medicine than directed. Call emergency for help if you have problems breathing or unusual sleepiness. You may get drowsy or dizzy. Do not drive, use machinery, or do anything that needs mental alertness until you know how this medicine affects you. Do not stand or sit up quickly, especially if you are an older patient. This reduces the risk of dizzy or fainting spells. Alcohol can increase or decrease the effects of this medicine. Avoid alcoholic drinks. You may have constipation. Try to have a bowel movement at least every 2 to 3 days. If you do not have a bowel movement for 3 days, call your doctor or health care professional. Your mouth may get dry. Chewing sugarless gum or sucking hard candy, and drinking plenty of water may help. Contact your doctor if the problem does not go away or is severe. What side effects may I notice from receiving this medicine? Side effects that you should report to your doctor or health care professional as soon as possible: -allergic reactions like skin rash, itching or hives, swelling of the face, lips, or tongue -breathing problems -confusion -seizures -signs and symptoms of low blood pressure like dizziness; feeling faint or lightheaded, falls; unusually weak or tired -trouble passing urine or change in the amount of urine Side effects that usually do not require medical attention (report to your doctor or health care professional if they continue or are bothersome): -constipation -dry  mouth -nausea, vomiting -tiredness This list may not describe all possible side effects. Call your doctor for medical advice about side effects. You may report side effects to FDA at 1-800-FDA-1088. Where should I keep my medicine? Keep out of the reach of children. This medicine may cause accidental overdose and death if it taken by other adults, children, or pets. Mix any unused medicine with a substance like cat litter or coffee grounds. Then throw the medicine away in a sealed container like a sealed bag or a coffee can with a lid. Do not use the medicine after the expiration date. Store at room temperature between 15 and 30 degrees C (59 and 86 degrees F). NOTE: This sheet is a summary. It may not cover all possible information. If you have questions about this medicine, talk to your doctor, pharmacist, or health care provider.  2018 Elsevier/Gold Standard (2014-11-22 09:00:04)  

## 2017-01-17 NOTE — Addendum Note (Signed)
Addended by: Inis Sizer D on: 01/17/2017 10:23 AM   Modules accepted: Orders

## 2017-01-22 ENCOUNTER — Encounter: Payer: Self-pay | Admitting: Physician Assistant

## 2017-01-22 ENCOUNTER — Telehealth: Payer: Self-pay | Admitting: Neurology

## 2017-01-22 DIAGNOSIS — Z77011 Contact with and (suspected) exposure to lead: Secondary | ICD-10-CM | POA: Insufficient documentation

## 2017-01-22 DIAGNOSIS — E539 Vitamin B deficiency, unspecified: Secondary | ICD-10-CM

## 2017-01-22 LAB — RPR: RPR Ser Ql: NONREACTIVE

## 2017-01-22 LAB — B12 AND FOLATE PANEL
Folate: 2.3 ng/mL — ABNORMAL LOW (ref >3.0)
VITAMIN B 12: 1107 pg/mL (ref 232–1245)

## 2017-01-22 LAB — ANA W/REFLEX: ANA: NEGATIVE

## 2017-01-22 LAB — MAGNESIUM: Magnesium: 2.6 mg/dL — ABNORMAL HIGH (ref 1.6–2.3)

## 2017-01-22 LAB — VITAMIN B6: Vitamin B6: 4.7 ug/L — ABNORMAL LOW (ref 5.3–46.7)

## 2017-01-22 LAB — METHYLMALONIC ACID, SERUM: Methylmalonic Acid: 137 nmol/L (ref 0–378)

## 2017-01-22 LAB — VITAMIN B1: THIAMINE: 65 nmol/L — AB (ref 66.5–200.0)

## 2017-01-22 LAB — SJOGREN'S SYNDROME ANTIBODS(SSA + SSB)
ENA SSA (RO) Ab: <0.2 AI (ref 0.0–0.9)
ENA SSB (LA) Ab: <0.2 AI (ref 0.0–0.9)

## 2017-01-22 LAB — B. BURGDORFI ANTIBODIES: Lyme IgG/IgM Ab: <0.91 {ISR} (ref 0.00–0.90)

## 2017-01-22 LAB — COPPER, SERUM: COPPER: 96 ug/dL (ref 72–166)

## 2017-01-22 LAB — RHEUMATOID FACTOR: Rhuematoid fact SerPl-aCnc: 10.1 IU/mL (ref 0.0–13.9)

## 2017-01-22 LAB — CRYOGLOBULIN

## 2017-01-22 LAB — HEAVY METALS, BLOOD
Arsenic: NOT DETECTED ug/L (ref 2–23)
LEAD, BLOOD: 12 ug/dL — AB (ref 0–4)
Mercury: NOT DETECTED ug/L (ref 0.0–14.9)

## 2017-01-22 LAB — HIV ANTIBODY (ROUTINE TESTING W REFLEX): HIV Screen 4th Generation wRfx: NONREACTIVE

## 2017-01-22 LAB — ANGIOTENSIN CONVERTING ENZYME: Angio Convert Enzyme: 114 U/L — ABNORMAL HIGH (ref 14–82)

## 2017-01-22 LAB — CK: CK TOTAL: 56 U/L (ref 24–204)

## 2017-01-22 NOTE — Telephone Encounter (Signed)
His labs show multiple vitamin deficiencies that are seen in conditions such as malnutrition or alcohol/substance abuse. Please call patient about the following problems which can cause neuropathy and other significant side effects such as  weakness, fatigue, easy bruising or bleeding,sore tongue, stomach upset, weight loss, and diarrhea or constipation, tingling or numbness to the fingers and toes, difficulty walking, mood changes, depression, memory loss, disorientation and, in severe cases, dementia.   Folate deficiency: 2.3 (N>3) Thiamine deficiency  65 (normal 66-200) B6 deficiency 4.7 (Normal  5.3-46)  Elevated lead at 12 (normal is 0-4) - is patient ingesting lead from some where? Not a critical value but not normal either. Please ensure patient is not participating in drug activities where he would be exposed to lead, or that there is lead in the house such as Lead-based paint and lead-contaminated dust in older buildings,. Other sources include contaminated air, water and soil. Adults who work with batteries, do home renovations or work in Pharmacologist shops also might be exposed to lead.  He needs a multivitamin with the following to take daily:  Folate 1-5mg  daily,100 mg thiamine daily, and approx 2mg  B6(not more than 100mg  a day of b6). A multi-B vitamin should be fine if they have all these at the doses suggested. Also needs to examine his diet with pcp.    I will not be seeing patient in follow up, needs to follow with pcp for re-testing in 4-6 months. Will CC her on this thanks.

## 2017-01-23 NOTE — Telephone Encounter (Signed)
Called and LVM asking for call back.   I will be discussing his lab results and Dr. Cathren Laine suggestions for vitamin replacement.

## 2017-01-24 NOTE — Telephone Encounter (Signed)
Attempted to call patient. LVM asking for call back. Unable to locate whether or not I could leave detailed vm.

## 2017-01-24 NOTE — Telephone Encounter (Signed)
Tried to call patient again today to discuss lab results. It sounded as though someone answered but it was very muffled and right much silence on the other side. Told to call our office. I will also try to call again.

## 2017-01-25 NOTE — Telephone Encounter (Signed)
Was able to reach patient. He verbalized understanding of his low levels of multiple vitamins including Folate, Vitamin B6 and Thiamine and how these low levels of vitamins can be caused by malnutrition and alcohol/substance abuse and can cause the problems outlined by Dr. Jaynee Eagles in her note. He is also aware of elevated lead level. He denied being exposed to any known sources of lead or participating in any drug activities which would expose him to lead. He said he used to work on [employer]. Pt verbalized undertanding of the importance of eating a healthy diet to make sure he is getting all of the required nutrients. He will f/u with his PCP to examine his diet & be retested in 4-6 months. Pt will start taking the following daily supplements:  Vitamin B6 2 mg daily (no more than 100 mg per day) Thiamine 100 mg Folate- I told him I would seek clarification of this dose and call him back.

## 2017-01-29 NOTE — Telephone Encounter (Signed)
He can start with 1-3mg  a day and he needs to eat better, maybe see a dietician. Discuss with his pcp and recheck folate at next pcp appointment thanks

## 2017-01-29 NOTE — Telephone Encounter (Signed)
Called and LVM asking for call back. I will be informing him of Dr. Cathren Laine latest message.

## 2017-01-31 ENCOUNTER — Encounter: Payer: Self-pay | Admitting: *Deleted

## 2017-01-31 NOTE — Telephone Encounter (Addendum)
Attempted to call patient again and was unable to reach him. Letter written to patient and sent to medical records to be mailed.

## 2017-01-31 NOTE — Telephone Encounter (Signed)
Called patient and LVM asking for call back. If he calls back, please inform him that he can take 1-3 mg of folate oral per day. He needs to eat better, maybe see a dietician and remember to discuss these vitamin deficiencies with his PCP and recheck folate at his next PCP appointment.   I had already spoken with him on the phone and discussed his other vitamin deficiencies and that he needs to eat a more balanced nutritious diet. He should discuss w/ his PCP and recheck levels in 4-6 months, and to start taking the following: oral Vitamin B6 2 mg daily (no more than 100 mg per day) & oral Thiamine 100 mg daily.  If we do not receive a call back I will send a letter to the patient.

## 2017-02-14 ENCOUNTER — Ambulatory Visit: Payer: Self-pay | Admitting: Urology

## 2017-02-14 ENCOUNTER — Encounter (HOSPITAL_COMMUNITY): Payer: Self-pay

## 2017-02-14 ENCOUNTER — Other Ambulatory Visit: Payer: Self-pay

## 2017-02-14 ENCOUNTER — Emergency Department (HOSPITAL_COMMUNITY)
Admission: EM | Admit: 2017-02-14 | Discharge: 2017-02-14 | Disposition: A | Discharge status: Home / Self Care | Payer: Self-pay | Attending: Emergency Medicine | Admitting: Emergency Medicine

## 2017-02-14 DIAGNOSIS — Z79899 Other long term (current) drug therapy: Secondary | ICD-10-CM | POA: Insufficient documentation

## 2017-02-14 DIAGNOSIS — Z87891 Personal history of nicotine dependence: Secondary | ICD-10-CM | POA: Insufficient documentation

## 2017-02-14 DIAGNOSIS — Z7982 Long term (current) use of aspirin: Secondary | ICD-10-CM | POA: Insufficient documentation

## 2017-02-14 DIAGNOSIS — I10 Essential (primary) hypertension: Secondary | ICD-10-CM | POA: Insufficient documentation

## 2017-02-14 LAB — I-STAT CHEM 8, ED
BUN: 8 mg/dL (ref 6–20)
CHLORIDE: 97 mmol/L — AB (ref 101–111)
Calcium, Ion: 1.04 mmol/L — ABNORMAL LOW (ref 1.15–1.40)
Creatinine, Ser: 0.9 mg/dL (ref 0.61–1.24)
Glucose, Bld: 128 mg/dL — ABNORMAL HIGH (ref 65–99)
HEMATOCRIT: 47 % (ref 39.0–52.0)
Hemoglobin: 16 g/dL (ref 13.0–17.0)
Potassium: 3.8 mmol/L (ref 3.5–5.1)
SODIUM: 139 mmol/L (ref 135–145)
TCO2: 30 mmol/L (ref 22–32)

## 2017-02-14 LAB — I-STAT TROPONIN, ED: Troponin i, poc: 0 ng/mL (ref 0.00–0.08)

## 2017-02-14 MED ORDER — METOPROLOL TARTRATE 25 MG PO TABS: 25.0000 mg | ORAL_TABLET | Freq: Two times a day (BID) | ORAL | 0 refills | Status: DC

## 2017-02-14 NOTE — ED Triage Notes (Signed)
Pt reports he woke up this morning and checked his BP out of curiosity b/c he has not in a while. Pt reports he hasn't been taking his BP med at night b/c he forgets, but takes it in the  Morning. Pt reports some chest pressure yesterday with vomiting, but that has resolved. Pt denies any symptoms today, just worried about BP. Reports his BP was 176/108 in left arm and was 235/108 in the right arm.

## 2017-02-14 NOTE — ED Provider Notes (Addendum)
[facility] EMERGENCY DEPARTMENT Provider Note   CSN: [id number] Arrival date & time: 02/14/17  0535  Time seen 6:10 AM   History   Chief Complaint Chief Complaint  Patient presents with  . Hypertension    HPI Tristan Cook is a 56 y.o. male.  HPI patient states he was curious about his blood pressure this morning and he happened to take it.  He states it was very high so he came to the ED.  He denies having headache, change in his blurred vision, nausea, vomiting, diarrhea, chest pain, shortness of breath or swelling of his extremities.  He has numbness of his feet that is currently being evaluated by Dr. Lavell Anchors, neurologist.  He was noted to have several vitamin deficiencies consistent with his alcohol abuse.  He also had elevated lead level, patient states he did used to drink moonshine.  He has not started taking the vitamins she prescribed yet.  He states he still drinks on the weekends.  We discussed that if he continues to drink his numbness will progress and could even involve his hands.  Patient states about 9 PM last night he had chest pain in the center of his chest for about 1 hour that he described as dull.  He states nothing he did made it feel worse or made it feel better.  He also states yesterday he had vomiting about 10-12 times however that is now gone.  Patient states he takes his blood pressure pills to 25 mg once a day instead of 12 and half milligrams twice a day as prescribed.  He states he last took them at 5 AM today.  He states he took a whole dose.  He complains of persistent blurred vision since he was put on gabapentin.  He states he was evaluated by an ophthalmologist and was told "nothing was wrong".  He states even with glasses he still has blurred vision.  We discussed seeing a different ophthalmologist just to get a second opinion.  PCP Soyla Dryer, PA-C Neurology Dr Lavell Anchors  Past Medical History:  Diagnosis Date  . Abdominal pain   .  Adenomatous colon polyp 2008  . Arthritis   . Back pain   . Bilateral lumbar radiculopathy   . Blurred vision, bilateral   . Chronic back pain   . Constipation   . Depression   . GERD (gastroesophageal reflux disease)   . Gout   . Hemorrhoids   . High cholesterol   . Hypertension   . Neck pain   . Neuropathy of both feet   . Numbness    rectal  . Numbness of legs   . Substance abuse ([place])    h/o excessive alcohol use; pt says he limits use to one to 2 beers daily he currently    Patient Active Problem List   Diagnosis Date Noted  . Vitamin B deficiency 01/22/2017  . 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 Stroke, small vessel ([place]) 01/17/2017  . 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 Vitamin B12 deficiency neuropathy ([place]) 01/17/2017  . History of colonic polyps 06/14/2016  . Elevated blood pressure reading 06/14/2016  . Hemorrhoids 09/20/2015  . Depression 08/03/2015  . Back pain with radiation 06/15/2015  . Insomnia 06/03/2015  . Hyperlipidemia LDL goal <130 04/25/2015  . 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 Fatigue 03/29/2015  . Allergic rhinitis 02/10/2015  . Esophageal stricture 10/22/2014  . Overweight (BMI 25.0-29.9) 10/06/2013  . GERD (gastroesophageal reflux disease) 04/23/2012  .  Hemorrhoids, internal 11/02/2011  . Essential hypertension 07/25/2007  . Neck pain on left side 07/25/2007    Past Surgical History:  Procedure Laterality Date  . COLONOSCOPY  10/09/2006   3 mm pedunculated sigmoid colon polyp removed/8-mm sessile hepatic flexure polyp (tubular villous adenoma) removed/6-mm  descending colon polyp removed/small internal hemorrhoids  . COLONOSCOPY  02/28/2011   NFA:OZHYQM, multiple in the rectum/Internal hemorrhoids, MODERATE-CAUSING RECTAL BLEEDING  . COLONOSCOPY N/A 07/05/2016   Procedure: COLONOSCOPY;  Surgeon: [name], MD;  Location: [facility] ENDO SUITE;  Service: Endoscopy;  Laterality: N/A;  11:15am  . ESOPHAGOGASTRODUODENOSCOPY N/A  07/21/2014   SLF: 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 Mild erosive gastrtitis most likely due to indomethacin   . FINGER SURGERY Right    4th and 5th digit  . FLEXIBLE SIGMOIDOSCOPY  01/02/2012   SLF: 1. the colonic mucosa appeared normal in the sigmoid colon 2. Large internal hemorrhoids: cause for rectal bleeding/pain . s/p banding X 3   . HAND SURGERY    . SAVORY DILATION N/A 07/21/2014   Procedure: SAVORY DILATION;  Surgeon: [name], MD;  Location: [facility] ENDO SUITE;  Service: Endoscopy;  Laterality: N/A;       Home Medications    Prior to Admission medications   Medication Sig Start Date End Date Taking? Authorizing Provider  aspirin EC 325 MG tablet Take 1 tablet (325 mg total) daily by mouth. 01/17/17   Melvenia Beam, MD  B Complex Vitamins (VITAMIN B COMPLEX) TABS Take 1 tablet by mouth daily.     [provider]  cholecalciferol (VITAMIN D) 1000 units tablet Take 1,000 Units by mouth daily.    [provider]  diphenhydrAMINE (SOMINEX) 25 MG tablet Take 12.5 mg at bedtime as needed by mouth for sleep.     [provider]  docusate sodium (COLACE) 100 MG capsule Take 100 mg by mouth 2 (two) times daily.    [provider]  fludrocortisone (FLORINEF) 0.1 MG tablet Take 1 tablet (0.1 mg total) by mouth 2 (two) times daily. 12/05/16   Herminio Commons, MD  FLUoxetine (PROZAC) 20 MG tablet Take 1 tablet (20 mg total) by mouth daily. 09/12/16   Soyla Dryer, PA-C  metoprolol tartrate (LOPRESSOR) 25 MG tablet Take 1 tablet (25 mg total) by mouth 2 (two) times daily. 02/14/17 05/15/17  Rolland Porter, MD  testosterone cypionate (DEPOTESTOSTERONE CYPIONATE) 200 MG/ML injection Inject 100 mg into the muscle 2 (two) times a week. 05/04/16   [provider]  traMADol (ULTRAM) 50 MG tablet Take 1 tablet (50 mg total) at bedtime as needed by mouth. 01/17/17   Melvenia Beam, MD    Family History Family History  Problem Relation  Age of Onset  . Hypertension Mother   . Hypertension Father   . Stroke Father   . Hypertension Sister   . Hypertension Brother   . Cancer Brother   . Cancer Brother        throat cancer  . Hypertension Brother   . Diabetes Maternal Grandmother   . Colon cancer Neg Hx     Social History Social History   Tobacco Use  . Smoking status: Former Smoker    Packs/day: 1.50    Years: 33.00    Pack years: 49.50    Types: Cigarettes    Last attempt to quit: 08/16/2004    Years since quitting: 12.5  . Smokeless tobacco: Current User    Types: Snuff  . Tobacco  comment: Dips every once in a while  Substance Use Topics  . Alcohol use: Yes    Alcohol/week: 3.6 oz    Types: 6 Cans of beer per week    Comment: drinks 6 pack of beer on weekends  . Drug use: No  unemployed   Allergies   Benadryl [diphenhydramine hcl (sleep)]   Review of Systems Review of Systems  All other systems reviewed and are negative.    Physical Exam ED Triage Vitals  Enc Vitals Group     BP 02/14/17 0546 (!) 170/109     Pulse Rate 02/14/17 0546 76     Resp 02/14/17 0546 16     Temp 02/14/17 0546 98 F (36.7 C)     Temp Source 02/14/17 0546 Oral     SpO2 02/14/17 0546 96 %     Weight 02/14/17 0545 160 lb (72.6 kg)     Height 02/14/17 0545 6\' 2"  (1.88 m)     Head Circumference --      Peak Flow --      Pain Score --      Pain Loc --      Pain Edu? --      Excl. in [place]? --    Vital signs normal except hypertension    Physical Exam  Constitutional: He is oriented to person, place, and time.  Non-toxic appearance. He does not appear ill. No distress.  Tall thin, pleasant, playing on his cell phone  HENT:  Head: Normocephalic and atraumatic.  Right Ear: External ear normal.  Left Ear: External ear normal.  Nose: Nose normal. No mucosal edema or rhinorrhea.  Mouth/Throat: Oropharynx is clear and moist and mucous membranes are normal. No dental abscesses or uvula swelling.  Eyes: Conjunctivae  and EOM are normal. Pupils are equal, round, and reactive to light.  Neck: Normal range of motion and full passive range of motion without pain. Neck supple.  Cardiovascular: Normal rate, regular rhythm and normal heart sounds. Exam reveals no gallop and no friction rub.  No murmur heard. Pulmonary/Chest: Effort normal and breath sounds normal. No respiratory distress. He has no wheezes. He has no rhonchi. He has no rales. He exhibits no tenderness and no crepitus.  Abdominal: Soft. Normal appearance and bowel sounds are normal. He exhibits no distension. There is no tenderness. There is no rebound and no guarding.  Musculoskeletal: Normal range of motion. He exhibits no edema or tenderness.  Moves all extremities well.   Neurological: He is alert and oriented to person, place, and time. He has normal strength. No cranial nerve deficit.  Skin: Skin is warm, dry and intact. No rash noted. No erythema. No pallor.  Psychiatric: He has a normal mood and affect. His speech is normal and behavior is normal. His mood appears not anxious.  Nursing note and vitals reviewed.    ED Treatments / Results  Labs (all labs ordered are listed, but only abnormal results are displayed) Results for orders placed or performed during the hospital encounter of 02/14/17  I-stat Chem 8, ED  Result Value Ref Range   Sodium 139 135 - 145 mmol/L   Potassium 3.8 3.5 - 5.1 mmol/L   Chloride 97 (L) 101 - 111 mmol/L   BUN 8 6 - 20 mg/dL   Creatinine, Ser 0.90 0.61 - 1.24 mg/dL   Glucose, Bld 128 (H) 65 - 99 mg/dL   Calcium, Ion 1.04 (L) 1.15 - 1.40 mmol/L   TCO2 30 22 - 32  mmol/L   Hemoglobin 16.0 13.0 - 17.0 g/dL   HCT 47.0 39.0 - 52.0 %  I-stat troponin, ED  Result Value Ref Range   Troponin i, poc 0.00 0.00 - 0.08 ng/mL   Comment 3           Laboratory interpretation all normal except mild hyperglycemia    EKG  EKG Interpretation  Date/Time:  Wednesday February 14 2017 05:53:53 EST Ventricular Rate:    78 PR Interval:    QRS Duration: 90 QT Interval:  454 QTC Calculation: 518 R Axis:   43 Text Interpretation:  Sinus rhythm Minimal ST depression, inferior leads Prolonged QT interval Probable Anteroseptal infarct Since last tracing 12 Dec 2016 Nonspecific ST abnormality Confirmed by Rolland Porter [id number]) on 02/14/2017 6:07:06 AM       Radiology No results found.  Procedures Procedures (including critical care time)  Medications Ordered in ED Medications - No data to display   Initial Impression / Assessment and Plan / ED Course  I have reviewed the triage vital signs and the nursing notes.  Pertinent labs & imaging results that were available during my care of the patient were reviewed by me and considered in my medical decision making (see chart for details).    I reviewed Dr. Chong Sicilian notes, she did extensive blood work on him In November.  Patient has asymptomatic hypertension that he already treated prior to coming to the ED.  His blood pressure slowly improved during his ED visit.  Patient's blood pressure improved to 153/106 in the ED without treatment.  He states he feels fine except for his chronic complaints of blurred vision and numbness.[name]  He denies any nausea now.   Final Clinical Impressions(s) / ED Diagnoses   Final diagnoses:  Essential hypertension    ED Discharge Orders        Ordered    metoprolol tartrate (LOPRESSOR) 25 MG tablet  2 times daily     02/14/17 0644      Plan discharge  Rolland Porter, MD, Barbette Or, MD 02/14/17 Frank, [place], MD 02/14/17 [id number]

## 2017-02-14 NOTE — Discharge Instructions (Signed)
Take your blood pressure pill, full tablet, twice a day. Monitor your blood pressure daily and write down the number so you can show your doctor. Recheck if you get a headache, chest pain, shortness of breath.

## 2017-02-22 ENCOUNTER — Ambulatory Visit: Payer: Self-pay | Admitting: Physician Assistant

## 2017-02-22 ENCOUNTER — Encounter: Payer: Self-pay | Admitting: Physician Assistant

## 2017-02-22 VITALS — BP 120/84 | HR 91 | Temp 97.3°F | Ht 74.0 in | Wt 167.5 lb

## 2017-02-22 DIAGNOSIS — Z125 Encounter for screening for malignant neoplasm of prostate: Secondary | ICD-10-CM

## 2017-02-22 DIAGNOSIS — F329 Major depressive disorder, single episode, unspecified: Secondary | ICD-10-CM

## 2017-02-22 DIAGNOSIS — I10 Essential (primary) hypertension: Secondary | ICD-10-CM

## 2017-02-22 DIAGNOSIS — G5793 Unspecified mononeuropathy of bilateral lower limbs: Secondary | ICD-10-CM

## 2017-02-22 DIAGNOSIS — G621 Alcoholic polyneuropathy: Secondary | ICD-10-CM

## 2017-02-22 DIAGNOSIS — E639 Nutritional deficiency, unspecified: Secondary | ICD-10-CM

## 2017-02-22 DIAGNOSIS — G63 Polyneuropathy in diseases classified elsewhere: Secondary | ICD-10-CM

## 2017-02-22 DIAGNOSIS — F32A Depression, unspecified: Secondary | ICD-10-CM

## 2017-02-22 DIAGNOSIS — E538 Deficiency of other specified B group vitamins: Secondary | ICD-10-CM

## 2017-02-22 NOTE — Progress Notes (Signed)
BP 120/84 (BP Location: Left Arm, Patient Position: Sitting, Cuff Size: Normal)   Pulse 91   Temp (!) 97.3 F (36.3 C) (Other (Comment))   Ht 6\' 2"  (1.88 m)   Wt 167 lb 8 oz (76 kg)   SpO2 98%   BMI 21.51 kg/m    Subjective:    Patient ID: Tristan Cook, male    DOB: Aug 06, 1960, 56 y.o.   MRN: [id number]  HPI: Tristan Cook is a 56 y.o. male presenting on 02/22/2017 for Hypertension   HPI   Pt is not going to [facility].  Says it's due to transportation problems  Pt says he applied for medicaid and got denied  Pt went to neurology/dr Jaynee Eagles.  Was told vitamin deficiances - needs folate checked.  Needs to take vitamin b6 no more than 100mg  daily.  and thiamine 100mg .  Dx with polyneuropathy.   PT was in note but pt states she did not talk with him about that.  Pt says he couldn't do PT anyway due to transportation problems.  Recommended to Continue ASA  Pt says he doesn't drink much- just on sundays while watching football.  Relevant past medical, surgical, family and social history reviewed and updated as indicated. Interim medical history since our last visit reviewed. Allergies and medications reviewed and updated.   Current Outpatient Medications:  .  aspirin EC 325 MG tablet, Take 1 tablet (325 mg total) daily by mouth., Disp: 30 tablet, Rfl: 0 .  B Complex Vitamins (VITAMIN B COMPLEX) TABS, Take 1 tablet by mouth daily. , Disp: , Rfl:  .  cholecalciferol (VITAMIN D) 1000 units tablet, Take 1,000 Units by mouth daily., Disp: , Rfl:  .  diphenhydrAMINE (SOMINEX) 25 MG tablet, Take 12.5 mg at bedtime as needed by mouth for sleep. , Disp: , Rfl:  .  docusate sodium (COLACE) 100 MG capsule, Take 100 mg by mouth 2 (two) times daily., Disp: , Rfl:  .  FLUoxetine (PROZAC) 20 MG tablet, Take 1 tablet (20 mg total) by mouth daily., Disp: 90 tablet, Rfl: 1 .  metoprolol tartrate (LOPRESSOR) 25 MG tablet, Take 1 tablet (25 mg total) by mouth 2 (two) times daily., Disp:  60 tablet, Rfl: 0 .  testosterone cypionate (DEPOTESTOSTERONE CYPIONATE) 200 MG/ML injection, Inject 100 mg into the muscle 2 (two) times a week., Disp: , Rfl: 3 .  traMADol (ULTRAM) 50 MG tablet, Take 1 tablet (50 mg total) at bedtime as needed by mouth., Disp: 30 tablet, Rfl: 1 .  fludrocortisone (FLORINEF) 0.1 MG tablet, Take 1 tablet (0.1 mg total) by mouth 2 (two) times daily. (Patient not taking: Reported on 02/22/2017), Disp: 60 tablet, Rfl: 3   Review of Systems  Constitutional: Negative for appetite change, chills, diaphoresis, fatigue, fever and unexpected weight change.  HENT: Negative for congestion, drooling, ear pain, facial swelling, hearing loss, mouth sores, sneezing, sore throat, trouble swallowing and voice change.   Eyes: Negative for pain, discharge, redness, itching and visual disturbance.  Respiratory: Negative for cough, choking, shortness of breath and wheezing.   Cardiovascular: Negative for chest pain, palpitations and leg swelling.  Gastrointestinal: Negative for abdominal pain, blood in stool, constipation, diarrhea and vomiting.  Endocrine: Negative for cold intolerance, heat intolerance and polydipsia.  Genitourinary: Negative for decreased urine volume, dysuria and hematuria.  Musculoskeletal: Negative for arthralgias, back pain and gait problem.  Skin: Negative for rash.  Allergic/Immunologic: Negative for environmental allergies.  Neurological: Negative for seizures, syncope, light-headedness  and headaches.  Hematological: Negative for adenopathy.  Psychiatric/Behavioral: Positive for agitation. Negative for dysphoric mood and suicidal ideas. The patient is not nervous/anxious.     Per HPI unless specifically indicated above     Objective:    BP 120/84 (BP Location: Left Arm, Patient Position: Sitting, Cuff Size: Normal)   Pulse 91   Temp (!) 97.3 F (36.3 C) (Other (Comment))   Ht 6\' 2"  (1.88 m)   Wt 167 lb 8 oz (76 kg)   SpO2 98%   BMI 21.51  kg/m   Wt Readings from Last 3 Encounters:  02/22/17 167 lb 8 oz (76 kg)  02/14/17 160 lb (72.6 kg)  01/17/17 167 lb 12.8 oz (76.1 kg)    Physical Exam  Constitutional: He is oriented to person, place, and time. He appears well-developed and well-nourished.  HENT:  Head: Normocephalic and atraumatic.  Neck: Neck supple.  Cardiovascular: Normal rate and regular rhythm.  Pulmonary/Chest: Effort normal and breath sounds normal. He has no wheezes.  Abdominal: Soft. Bowel sounds are normal. There is no hepatosplenomegaly. There is no tenderness.  Musculoskeletal: He exhibits no edema.  Lymphadenopathy:    He has no cervical adenopathy.  Neurological: He is alert and oriented to person, place, and time. Gait (ambulates with a cane) abnormal.  Skin: Skin is warm and dry.  Psychiatric: He has a normal mood and affect. His behavior is normal.  Vitals reviewed.   Results for orders placed or performed during the hospital encounter of 02/14/17  I-stat Chem 8, ED  Result Value Ref Range   Sodium 139 135 - 145 mmol/L   Potassium 3.8 3.5 - 5.1 mmol/L   Chloride 97 (L) 101 - 111 mmol/L   BUN 8 6 - 20 mg/dL   Creatinine, Ser 0.90 0.61 - 1.24 mg/dL   Glucose, Bld 128 (H) 65 - 99 mg/dL   Calcium, Ion 1.04 (L) 1.15 - 1.40 mmol/L   TCO2 30 22 - 32 mmol/L   Hemoglobin 16.0 13.0 - 17.0 g/dL   HCT 47.0 39.0 - 52.0 %  I-stat troponin, ED  Result Value Ref Range   Troponin i, poc 0.00 0.00 - 0.08 ng/mL   Comment 3              Assessment & Plan:   Encounter Diagnoses  Name Primary?  . Essential hypertension Yes  . Vitamin B12 deficiency neuropathy ([place])   . Alcoholic peripheral neuropathy ([place])   . Depression, unspecified depression type   . Screening for prostate cancer   . Neuropathy of both feet   . Nutritional deficiency      -discussed with Pt that he needs to get back to [facility].  -Gave pt info on [facility] -will Refer to nutritionist -will check labs for deficiencies as  recommended by neruology -pt to continue current bp medications -pt to follow up 3 months. RTO sooner

## 2017-02-23 NOTE — Telephone Encounter (Signed)
Indu/[facility] in [place] [phone] called labs on 01/17/17 showed high level of lead. She is needing to know if this is job related. Please call to advise.

## 2017-02-23 NOTE — Telephone Encounter (Signed)
Returned Indu's call w/ [facility]. I informed her that per Dr. Jaynee Eagles, pt reported to ED that he drinks a lot of moonshine. She verbalized understanding and had no further questions.

## 2017-03-09 NOTE — Telephone Encounter (Addendum)
Called the patient back. He reported that he had never called back regarding the vitamins that we had discussed. I advised him that I had sent a letter to his house on [address]. He said that he never got the letter. I clarified the vitamin doses on the phone with him. He was at the store getting them at the time of the call. I told him to get the following:   Thiamine 100 mg oral daily Vitamin B6  Approximately 2 mg daily (no more than 100 mg daily) Folate 1-3 mg oral daily  I told him that a multi-B vitamin would be fine if it includes all that Dr. Jaynee Eagles wants him to take. He verbalized understanding and will call if he has any further questions and will seek pharmacy assistance if he has difficulty finding the vitamins. During the call he mentioned that he has had difficulty due to not having enough money. I suggested the [employer] financial assistance program that he can sign up for online. There is no guarantee he would qualify but it is worth the try. He stated he would do that. He also said that his PCP had called him and that he would be calling them back to make an appt. I reiterated that Dr. Jaynee Eagles wants him to eat a nutritious diet, maybe see a dietician. He verbalized understanding and appreciation for the call.

## 2017-03-09 NOTE — Telephone Encounter (Signed)
Pt returned RN's call. He can be reached at [phone]

## 2017-03-13 DIAGNOSIS — I639 Cerebral infarction, unspecified: Secondary | ICD-10-CM

## 2017-03-13 HISTORY — DX: Cerebral infarction, unspecified: I63.9

## 2017-04-03 ENCOUNTER — Other Ambulatory Visit: Payer: Self-pay

## 2017-04-03 ENCOUNTER — Ambulatory Visit: Payer: Self-pay | Admitting: Nutrition

## 2017-04-03 ENCOUNTER — Telehealth: Payer: Self-pay | Admitting: Nutrition

## 2017-04-03 ENCOUNTER — Emergency Department (HOSPITAL_COMMUNITY): Payer: Self-pay

## 2017-04-03 ENCOUNTER — Emergency Department (HOSPITAL_COMMUNITY)
Admission: EM | Admit: 2017-04-03 | Discharge: 2017-04-03 | Disposition: A | Discharge status: Home / Self Care | Payer: Self-pay | Attending: Emergency Medicine | Admitting: Emergency Medicine

## 2017-04-03 ENCOUNTER — Encounter (HOSPITAL_COMMUNITY): Payer: Self-pay | Admitting: Emergency Medicine

## 2017-04-03 DIAGNOSIS — I10 Essential (primary) hypertension: Secondary | ICD-10-CM | POA: Insufficient documentation

## 2017-04-03 DIAGNOSIS — Z8673 Personal history of transient ischemic attack (TIA), and cerebral infarction without residual deficits: Secondary | ICD-10-CM | POA: Insufficient documentation

## 2017-04-03 DIAGNOSIS — G9009 Other idiopathic peripheral autonomic neuropathy: Secondary | ICD-10-CM | POA: Insufficient documentation

## 2017-04-03 DIAGNOSIS — K029 Dental caries, unspecified: Secondary | ICD-10-CM | POA: Insufficient documentation

## 2017-04-03 DIAGNOSIS — G629 Polyneuropathy, unspecified: Secondary | ICD-10-CM

## 2017-04-03 DIAGNOSIS — F17290 Nicotine dependence, other tobacco product, uncomplicated: Secondary | ICD-10-CM | POA: Insufficient documentation

## 2017-04-03 DIAGNOSIS — Z7982 Long term (current) use of aspirin: Secondary | ICD-10-CM | POA: Insufficient documentation

## 2017-04-03 DIAGNOSIS — Z79899 Other long term (current) drug therapy: Secondary | ICD-10-CM | POA: Insufficient documentation

## 2017-04-03 DIAGNOSIS — R42 Dizziness and giddiness: Secondary | ICD-10-CM | POA: Insufficient documentation

## 2017-04-03 LAB — CBC WITH DIFFERENTIAL/PLATELET
Basophils Absolute: 0 10*3/uL (ref 0.0–0.1)
Basophils Relative: 0 %
EOS ABS: 0.1 10*3/uL (ref 0.0–0.7)
EOS PCT: 1 %
HCT: 37.1 % — ABNORMAL LOW (ref 39.0–52.0)
Hemoglobin: 13.7 g/dL (ref 13.0–17.0)
LYMPHS PCT: 16 %
Lymphs Abs: 1.2 10*3/uL (ref 0.7–4.0)
MCH: 35.2 pg — ABNORMAL HIGH (ref 26.0–34.0)
MCHC: 36.9 g/dL — ABNORMAL HIGH (ref 30.0–36.0)
MCV: 95.4 fL (ref 78.0–100.0)
Monocytes Absolute: 1.5 10*3/uL — ABNORMAL HIGH (ref 0.1–1.0)
Monocytes Relative: 20 %
NEUTROS PCT: 63 %
Neutro Abs: 4.9 10*3/uL (ref 1.7–7.7)
PLATELETS: 315 10*3/uL (ref 150–400)
RBC: 3.89 MIL/uL — ABNORMAL LOW (ref 4.22–5.81)
RDW: 14.6 % (ref 11.5–15.5)
WBC: 7.7 10*3/uL (ref 4.0–10.5)

## 2017-04-03 LAB — COMPREHENSIVE METABOLIC PANEL
ALT: 176 U/L — ABNORMAL HIGH (ref 17–63)
ANION GAP: 14 (ref 5–15)
AST: 204 U/L — ABNORMAL HIGH (ref 15–41)
Albumin: 3.1 g/dL — ABNORMAL LOW (ref 3.5–5.0)
Alkaline Phosphatase: 105 U/L (ref 38–126)
BUN: 9 mg/dL (ref 6–20)
CALCIUM: 9.1 mg/dL (ref 8.9–10.3)
CHLORIDE: 89 mmol/L — AB (ref 101–111)
CO2: 28 mmol/L (ref 22–32)
Creatinine, Ser: 1.02 mg/dL (ref 0.61–1.24)
GFR calc non Af Amer: >60 mL/min (ref >60)
Glucose, Bld: 124 mg/dL — ABNORMAL HIGH (ref 65–99)
Potassium: 3.6 mmol/L (ref 3.5–5.1)
Sodium: 131 mmol/L — ABNORMAL LOW (ref 135–145)
Total Bilirubin: 3.2 mg/dL — ABNORMAL HIGH (ref 0.3–1.2)
Total Protein: 7.1 g/dL (ref 6.5–8.1)

## 2017-04-03 MED ORDER — MECLIZINE HCL 25 MG PO TABS: 25.0000 mg | ORAL_TABLET | Freq: Three times a day (TID) | ORAL | 0 refills | Status: DC | PRN

## 2017-04-03 MED ORDER — SODIUM CHLORIDE 0.9 % IV SOLN: 1000.0000 mL | INTRAVENOUS | Status: DC

## 2017-04-03 MED ORDER — TRAMADOL HCL 50 MG PO TABS
100.0000 mg | ORAL_TABLET | Freq: Once | ORAL | Status: AC
  Administered 2017-04-03: 100 mg via ORAL
  Filled 2017-04-03: qty 2

## 2017-04-03 MED ORDER — AMOXICILLIN 500 MG PO CAPS: 500.0000 mg | ORAL_CAPSULE | Freq: Three times a day (TID) | ORAL | 0 refills | Status: DC

## 2017-04-03 MED ORDER — SODIUM CHLORIDE 0.9 % IV BOLUS (SEPSIS)
1000.0000 mL | Freq: Once | INTRAVENOUS | Status: AC
  Administered 2017-04-03: 1000 mL via INTRAVENOUS

## 2017-04-03 MED ORDER — METOPROLOL TARTRATE 25 MG PO TABS
12.5000 mg | ORAL_TABLET | Freq: Once | ORAL | Status: AC
  Administered 2017-04-03: 12.5 mg via ORAL
  Filled 2017-04-03: qty 1

## 2017-04-03 MED ORDER — ONDANSETRON HCL 4 MG PO TABS
4.0000 mg | ORAL_TABLET | Freq: Once | ORAL | Status: AC
  Administered 2017-04-03: 4 mg via ORAL
  Filled 2017-04-03: qty 1

## 2017-04-03 MED ORDER — FAMOTIDINE 20 MG PO TABS
20.0000 mg | ORAL_TABLET | Freq: Once | ORAL | Status: AC
  Administered 2017-04-03: 20 mg via ORAL
  Filled 2017-04-03: qty 1

## 2017-04-03 NOTE — ED Notes (Signed)
Unable to draw labs from IV start

## 2017-04-03 NOTE — ED Triage Notes (Signed)
Pt states started taking a lot of new vitamins about 2-3 weeks ago. Pt states has been increasing dizziness and stumbling since. Also complaining of mouth swelling and numbness starting last week. Pt denies trouble swallowing but states he is drooling which is abnormal.denies chest pain or SOB.

## 2017-04-03 NOTE — Discharge Instructions (Signed)
Your heart rate has improved significantly with fluids and medication.  Please restart your metoprolol and your Prozac as previously ordered.  Please discuss the vitamins with Soyla Dryer.  Use meclizine 3 times daily for dizziness/lightheadedness.This medication may cause drowsiness. Please do not drink, drive, or participate in activity that requires concentration while taking this medication.  Your brain scan is negative for acute problem.  The scan of your facial bones reveals you have multiple cavities with swelling of your gum areas.  Please use Amoxil 3 times daily with food.  It is important that you see a dentist as soon as possible.

## 2017-04-03 NOTE — Telephone Encounter (Signed)
VM to call and reschedule missed appt. 

## 2017-04-03 NOTE — ED Provider Notes (Signed)
[facility] EMERGENCY DEPARTMENT Provider Note   CSN: [id number] Arrival date & time: 04/03/17  5366     History   Chief Complaint Chief Complaint  Patient presents with  . Dizziness    HPI Tristan Cook is a 57 y.o. male.  Patient is a 57 year old male who presents to the emergency department with a complaint of dizziness, numbness of teeth and gums.  The patient states that he has had 3 weeks of problems with dizziness that he describes as feeling lightheaded or off balance.  He states he feels that this is related to medication and/or vitamins that he is taking.  He stopped his vitamins approximately 1 week ago and he says the dizziness feels some better but is still present.  He states however that since coming off of the multiple vitamins that he has been taking that he now has numbness of his teeth and gums and he is also been suffering from a cold recently.  He has difficulty with brushing his teeth.  He says he feels as though "my teeth do not feel like they belong to my mouth".  He states at times he has problems with eating because his chewing seems to be different than what he is accustomed to.  He is not had any recent operations or procedures involving his mouth.  The patient states he had a fall about 3 weeks ago and he hit the back of his head.  He states he thinks he may have actually passed out for a short period of time, but he is not had any problem since that fall.  He denies any recent loss of bowel or bladder function.  He states that he has neuropathy involving both of his lower extremities, but he is able to walk around some.  He is not been eating well.  He states that when he does not feel well or when the numbness or pain of his extremities is acting up he does not eat.  The patient does dip tobacco, he also uses beer occasionally.  He denies use of any recreational drugs.  The patient states that in an effort to try to find what was making him not feel well he has  stopped taking his prescription medicines which included metoprolol as well as Prozac.  The patient 3 weeks ago was on vitamin B6, vitamin D3, vitamin D, and vitamin B12, as well as turmeric.  He has stopped all of these.  The patient is seen by Soyla Dryer, physician assistant for his primary care.  He is also been seen by [facility] neurolog[facility] for evaluation of his neuropathy.  He is not seen Soyla Dryer for over a month.  He presents now for assistance with these multiple issues.   The history is provided by the patient.  Dizziness  Associated symptoms: no blood in stool, no chest pain, no palpitations and no shortness of breath     Past Medical History:  Diagnosis Date  . Abdominal pain   . Adenomatous colon polyp 2008  . Arthritis   . Back pain   . Bilateral lumbar radiculopathy   . Blurred vision, bilateral   . Chronic back pain   . Constipation   . Depression   . GERD (gastroesophageal reflux disease)   . Gout   . Hemorrhoids   . High cholesterol   . Hypertension   . Neck pain   . Neuropathy of both feet   . Numbness    rectal  .  Numbness of legs   . Substance abuse ([place])    h/o excessive alcohol use; pt says he limits use to one to 2 beers daily he currently    Patient Active Problem List   Diagnosis Date Noted  . Vitamin B deficiency 01/22/2017  . 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 Stroke, small vessel ([place]) 01/17/2017  . Alcoholic peripheral neuropathy ([place]) 01/17/2017  . Vitamin B12 deficiency neuropathy ([place]) 01/17/2017  . History of colonic polyps 06/14/2016  . Elevated blood pressure reading 06/14/2016  . Hemorrhoids 09/20/2015  . Depression 08/03/2015  . Back pain with radiation 06/15/2015  . Insomnia 06/03/2015  . Hyperlipidemia LDL goal <130 04/25/2015  . 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 Fatigue 03/29/2015  . Allergic rhinitis 02/10/2015  . Esophageal stricture 10/22/2014  . Overweight (BMI 25.0-29.9) 10/06/2013  . GERD (gastroesophageal reflux  disease) 04/23/2012  . Hemorrhoids, internal 11/02/2011  . Essential hypertension 07/25/2007  . Neck pain on left side 07/25/2007    Past Surgical History:  Procedure Laterality Date  . COLONOSCOPY  10/09/2006   3 mm pedunculated sigmoid colon polyp removed/8-mm sessile hepatic flexure polyp (tubular villous adenoma) removed/6-mm  descending colon polyp removed/small internal hemorrhoids  . COLONOSCOPY  02/28/2011   KXF:GHWEXH, multiple in the rectum/Internal hemorrhoids, MODERATE-CAUSING RECTAL BLEEDING  . COLONOSCOPY N/A 07/05/2016   Procedure: COLONOSCOPY;  Surgeon: [name], MD;  Location: [facility];  Service: Endoscopy;  Laterality: N/A;  11:15am  . 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 Mild erosive gastrtitis most likely due to indomethacin   . FINGER SURGERY Right    4th and 5th digit  . FLEXIBLE SIGMOIDOSCOPY  01/02/2012   SLF: 1. the colonic mucosa appeared normal in the sigmoid colon 2. Large internal hemorrhoids: cause for rectal bleeding/pain . s/p banding X 3   . HAND SURGERY    . SAVORY DILATION N/A 07/21/2014   Procedure: SAVORY DILATION;  Surgeon: [name], MD;  Location: [facility];  Service: Endoscopy;  Laterality: N/A;       Home Medications    Prior to Admission medications   Medication Sig Start Date End Date Taking? Authorizing Provider  aspirin EC 325 MG tablet Take 1 tablet (325 mg total) daily by mouth. 01/17/17   Melvenia Beam, MD  B Complex Vitamins (VITAMIN B COMPLEX) TABS Take 1 tablet by mouth daily.     [provider]  cholecalciferol (VITAMIN D) 1000 units tablet Take 1,000 Units by mouth daily.    [provider]  diphenhydrAMINE (SOMINEX) 25 MG tablet Take 12.5 mg at bedtime as needed by mouth for sleep.     [provider]  docusate sodium (COLACE) 100 MG capsule Take 100 mg by mouth 2 (two) times daily.    [provider]    fludrocortisone (FLORINEF) 0.1 MG tablet Take 1 tablet (0.1 mg total) by mouth 2 (two) times daily. Patient not taking: Reported on 02/22/2017 12/05/16   Herminio Commons, MD  FLUoxetine (PROZAC) 20 MG tablet Take 1 tablet (20 mg total) by mouth daily. 09/12/16   Soyla Dryer, PA-C  metoprolol tartrate (LOPRESSOR) 25 MG tablet Take 1 tablet (25 mg total) by mouth 2 (two) times daily. 02/14/17 05/15/17  Rolland Porter, MD  testosterone cypionate (DEPOTESTOSTERONE CYPIONATE) 200 MG/ML injection Inject 100 mg into the muscle 2 (two) times a week. 05/04/16   [provider]  traMADol (ULTRAM) 50 MG tablet Take 1 tablet (50  mg total) at bedtime as needed by mouth. 01/17/17   Melvenia Beam, MD    Family History Family History  Problem Relation Age of Onset  . Hypertension Mother   . Hypertension Father   . Stroke Father   . Hypertension Sister   . Hypertension Brother   . Cancer Brother   . Cancer Brother        throat cancer  . Hypertension Brother   . Diabetes Maternal Grandmother   . Colon cancer Neg Hx     Social History Social History   Tobacco Use  . Smoking status: Former Smoker    Packs/day: 1.50    Years: 33.00    Pack years: 49.50    Types: Cigarettes    Last attempt to quit: 08/16/2004    Years since quitting: 12.6  . Smokeless tobacco: Current User    Types: Snuff  . Tobacco comment: Dips every once in a while  Substance Use Topics  . Alcohol use: Yes    Alcohol/week: 3.6 oz    Types: 6 Cans of beer per week    Comment: drinks 6 pack of beer on weekends  . Drug use: No     Allergies   Benadryl [diphenhydramine hcl (sleep)]   Review of Systems Review of Systems  Constitutional: Positive for activity change and appetite change. Negative for fever.       All ROS Neg except as noted in HPI  HENT: Positive for dental problem. Negative for nosebleeds.   Eyes: Negative for photophobia and discharge.  Respiratory: Negative for cough, shortness of breath  and wheezing.   Cardiovascular: Negative for chest pain and palpitations.  Gastrointestinal: Negative for abdominal pain and blood in stool.  Genitourinary: Negative for dysuria, frequency and hematuria.  Musculoskeletal: Negative for arthralgias, back pain and neck pain.  Skin: Negative.   Neurological: Positive for dizziness, light-headedness and numbness. Negative for seizures and speech difficulty.       Numbness of mouth, and both lower extremities.  Psychiatric/Behavioral: Negative for confusion and hallucinations.     Physical Exam Updated Vital Signs BP (!) 127/101 (BP Location: Right Arm)   Pulse (!) 119   Temp 97.8 F (36.6 C) (Oral)   Resp 20   Ht 6\' 2"  (1.88 m)   Wt 74.8 kg (165 lb)   SpO2 99%   BMI 21.18 kg/m   Physical Exam  Constitutional: He is oriented to person, place, and time. He appears well-developed and well-nourished.  Non-toxic appearance.  HENT:  Head: Normocephalic.  Right Ear: Tympanic membrane and external ear normal.  Left Ear: Tympanic membrane and external ear normal.  Mild nasal congestion present. Multiple dental caries of the upper and lower jaw.  Multiple areas of gum swelling.  No visible abscess appreciated.  The airway is patent.  The uvula is in the midline.  There is no swelling under the tongue.  The speech is understandable.  Eyes: EOM and lids are normal. Pupils are equal, round, and reactive to light.  Neck: Normal range of motion. Neck supple. Carotid bruit is not present.  Cardiovascular: Regular rhythm, normal heart sounds, intact distal pulses and normal pulses. Tachycardia present.  Tachycardia present at 121 bpm by my count.  Pulmonary/Chest: Breath sounds normal. No respiratory distress.  There is symmetrical rise and fall of the chest.  Patient speaks in complete sentences.  Abdominal: Soft. Bowel sounds are normal. There is no tenderness. There is no guarding.  Musculoskeletal: Normal range of motion.  Lymphadenopathy:        Head (right side): No submandibular adenopathy present.       Head (left side): No submandibular adenopathy present.    He has no cervical adenopathy.  Neurological: He is alert and oriented to person, place, and time. He has normal strength. No cranial nerve deficit or sensory deficit.  Patient has twitching of the lower extremities and movement of the lower extremities from time to time that seems to be involuntary.  No motor deficits appreciated on examination.  Skin: Skin is warm and dry.  Psychiatric: He has a normal mood and affect. His speech is normal.  Nursing note and vitals reviewed.    ED Treatments / Results  Labs (all labs ordered are listed, but only abnormal results are displayed) Labs Reviewed  COMPREHENSIVE METABOLIC PANEL  CBC WITH DIFFERENTIAL/PLATELET    EKG  EKG Interpretation  Date/Time:  Tuesday April 03 2017 09:41:40 EST Ventricular Rate:  110 PR Interval:    QRS Duration: 71 QT Interval:  384 QTC Calculation: 520 R Axis:   55 Text Interpretation:  Sinus tachycardia Borderline T abnormalities, inferior leads Prolonged QT interval Baseline wander in lead(s) V3 Confirmed by Julianne Rice [phone]) on 04/03/2017 10:07:50 AM       Radiology No results found.  Procedures Procedures (including critical care time)  Medications Ordered in ED Medications  sodium chloride 0.9 % bolus 1,000 mL (not administered)    Followed by  0.9 %  sodium chloride infusion (not administered)  metoprolol tartrate (LOPRESSOR) tablet 12.5 mg (not administered)  traMADol (ULTRAM) tablet 100 mg (not administered)  ondansetron (ZOFRAN) tablet 4 mg (not administered)     Initial Impression / Assessment and Plan / ED Course  I have reviewed the triage vital signs and the nursing notes.  Pertinent labs & imaging results that were available during my care of the patient were reviewed by me and considered in my medical decision making (see chart for details).        Final Clinical Impressions(s) / ED Diagnoses MDM Patient states he has not taken his Prozac nor his Lopressor for over a week.  Patient has not been eating as usual.  Patient will be given IV fluids and Lopressor.  Patient is also given Ultram to assist with his discomfort.  Complaints of metabolic panel shows the potassium to be normal.  The sodium is slightly low at 131 and the chloride is low at 89.  The albumin is low at 3.1.  The AST is elevated at 204, and the ALT is elevated at 176.  Total bilirubin is elevated at 3.2.  The anion gap is normal at 14.  The complete blood count shows the white blood cells to be normal at 7700.  Hemoglobin is normal at 13.7, the hematocrit is slightly low at 37.1.  On the smear there is noted target cells, with a mild shift to the left and atypical lymphocytes.  I have made the patient aware of this finding and he is to discuss this for recheck with Soyla Dryer, physician assistant. CT scan of the head is negative for intracranial abnormality.  CT maxillofacial bones shows dental caries at multiple sites with possible left molar periapical abscess.  The patient will be treated with Amoxil 3 times daily.  Prescription is also given to the patient for meclizine to assist with his lightheadedness.  Blood pressure is elevated.  I have asked the patient to resume his blood pressure medication as ordered, and  to see Soyla Dryer as soon as possible for follow-up and recheck.  Patient is in agreement with this plan.   Final diagnoses:  Dizziness  Dental caries  Neuropathy    ED Discharge Orders    None       Lily Kocher, PA-C 04/03/17 1433    Julianne Rice, MD 04/04/17 [phone]

## 2017-04-03 NOTE — ED Notes (Signed)
Patient transported to CT 

## 2017-04-09 ENCOUNTER — Telehealth: Payer: Self-pay | Admitting: Neurology

## 2017-04-09 NOTE — Telephone Encounter (Signed)
Pt is asking for a call from RN because of weakness in his legs (that he has been dealing with for about a month) pt is asking to be set up for therapy.  Please call

## 2017-04-10 ENCOUNTER — Inpatient Hospital Stay (HOSPITAL_COMMUNITY)
Admission: EM | Admit: 2017-04-10 | Discharge: 2017-04-11 | DRG: 093 | Disposition: A | Discharge status: Home Health | Payer: Self-pay | Attending: Family Medicine | Admitting: Family Medicine

## 2017-04-10 ENCOUNTER — Encounter (HOSPITAL_COMMUNITY): Payer: Self-pay | Admitting: Emergency Medicine

## 2017-04-10 ENCOUNTER — Other Ambulatory Visit: Payer: Self-pay

## 2017-04-10 DIAGNOSIS — F329 Major depressive disorder, single episode, unspecified: Secondary | ICD-10-CM | POA: Diagnosis present

## 2017-04-10 DIAGNOSIS — G8929 Other chronic pain: Secondary | ICD-10-CM | POA: Diagnosis present

## 2017-04-10 DIAGNOSIS — E639 Nutritional deficiency, unspecified: Secondary | ICD-10-CM

## 2017-04-10 DIAGNOSIS — I10 Essential (primary) hypertension: Secondary | ICD-10-CM | POA: Diagnosis present

## 2017-04-10 DIAGNOSIS — M549 Dorsalgia, unspecified: Secondary | ICD-10-CM | POA: Diagnosis present

## 2017-04-10 DIAGNOSIS — F17290 Nicotine dependence, other tobacco product, uncomplicated: Secondary | ICD-10-CM | POA: Diagnosis present

## 2017-04-10 DIAGNOSIS — K219 Gastro-esophageal reflux disease without esophagitis: Secondary | ICD-10-CM | POA: Diagnosis present

## 2017-04-10 DIAGNOSIS — F1010 Alcohol abuse, uncomplicated: Secondary | ICD-10-CM

## 2017-04-10 DIAGNOSIS — Z79899 Other long term (current) drug therapy: Secondary | ICD-10-CM

## 2017-04-10 DIAGNOSIS — K760 Fatty (change of) liver, not elsewhere classified: Secondary | ICD-10-CM | POA: Diagnosis present

## 2017-04-10 DIAGNOSIS — R531 Weakness: Secondary | ICD-10-CM

## 2017-04-10 DIAGNOSIS — Z8249 Family history of ischemic heart disease and other diseases of the circulatory system: Secondary | ICD-10-CM

## 2017-04-10 DIAGNOSIS — R7401 Elevation of levels of liver transaminase levels: Secondary | ICD-10-CM

## 2017-04-10 DIAGNOSIS — G8314 Monoplegia of lower limb affecting left nondominant side: Principal | ICD-10-CM | POA: Diagnosis present

## 2017-04-10 DIAGNOSIS — G8311 Monoplegia of lower limb affecting right dominant side: Secondary | ICD-10-CM | POA: Diagnosis present

## 2017-04-10 DIAGNOSIS — G629 Polyneuropathy, unspecified: Secondary | ICD-10-CM | POA: Diagnosis present

## 2017-04-10 DIAGNOSIS — R29898 Other symptoms and signs involving the musculoskeletal system: Secondary | ICD-10-CM | POA: Diagnosis present

## 2017-04-10 DIAGNOSIS — R740 Nonspecific elevation of levels of transaminase and lactic acid dehydrogenase [LDH]: Secondary | ICD-10-CM

## 2017-04-10 DIAGNOSIS — Z888 Allergy status to other drugs, medicaments and biological substances status: Secondary | ICD-10-CM

## 2017-04-10 DIAGNOSIS — Z7982 Long term (current) use of aspirin: Secondary | ICD-10-CM

## 2017-04-10 DIAGNOSIS — E7800 Pure hypercholesterolemia, unspecified: Secondary | ICD-10-CM | POA: Diagnosis present

## 2017-04-10 LAB — HEPATIC FUNCTION PANEL
ALT: 177 U/L — AB (ref 17–63)
AST: 286 U/L — AB (ref 15–41)
Albumin: 3.2 g/dL — ABNORMAL LOW (ref 3.5–5.0)
Alkaline Phosphatase: 115 U/L (ref 38–126)
BILIRUBIN DIRECT: 1 mg/dL — AB (ref 0.1–0.5)
Indirect Bilirubin: 2 mg/dL — ABNORMAL HIGH (ref 0.3–0.9)
Total Bilirubin: 3 mg/dL — ABNORMAL HIGH (ref 0.3–1.2)
Total Protein: 7.4 g/dL (ref 6.5–8.1)

## 2017-04-10 LAB — URINALYSIS, ROUTINE W REFLEX MICROSCOPIC
Bilirubin Urine: NEGATIVE
GLUCOSE, UA: NEGATIVE mg/dL
Hgb urine dipstick: NEGATIVE
KETONES UR: NEGATIVE mg/dL
Leukocytes, UA: NEGATIVE
NITRITE: NEGATIVE
PROTEIN: NEGATIVE mg/dL
Specific Gravity, Urine: 1.011 (ref 1.005–1.030)
pH: 5 (ref 5.0–8.0)

## 2017-04-10 LAB — PROTIME-INR
INR: 1.19
PROTHROMBIN TIME: 15 s (ref 11.4–15.2)

## 2017-04-10 LAB — BASIC METABOLIC PANEL
Anion gap: 17 — ABNORMAL HIGH (ref 5–15)
CALCIUM: 9.3 mg/dL (ref 8.9–10.3)
CO2: 24 mmol/L (ref 22–32)
CREATININE: 0.94 mg/dL (ref 0.61–1.24)
Chloride: 95 mmol/L — ABNORMAL LOW (ref 101–111)
Glucose, Bld: 112 mg/dL — ABNORMAL HIGH (ref 65–99)
Potassium: 3.3 mmol/L — ABNORMAL LOW (ref 3.5–5.1)
SODIUM: 136 mmol/L (ref 135–145)

## 2017-04-10 LAB — CBC
HCT: 38.6 % — ABNORMAL LOW (ref 39.0–52.0)
Hemoglobin: 13.6 g/dL (ref 13.0–17.0)
MCH: 35.1 pg — ABNORMAL HIGH (ref 26.0–34.0)
MCHC: 35.2 g/dL (ref 30.0–36.0)
MCV: 99.5 fL (ref 78.0–100.0)
PLATELETS: 314 10*3/uL (ref 150–400)
RBC: 3.88 MIL/uL — ABNORMAL LOW (ref 4.22–5.81)
RDW: 15.3 % (ref 11.5–15.5)
WBC: 8.2 10*3/uL (ref 4.0–10.5)

## 2017-04-10 LAB — RAPID URINE DRUG SCREEN, HOSP PERFORMED
Amphetamines: NOT DETECTED
BARBITURATES: NOT DETECTED
Benzodiazepines: NOT DETECTED
COCAINE: NOT DETECTED
OPIATES: NOT DETECTED
Tetrahydrocannabinol: NOT DETECTED

## 2017-04-10 LAB — ETHANOL

## 2017-04-10 MED ORDER — ONDANSETRON HCL 4 MG/2ML IJ SOLN
4.0000 mg | Freq: Four times a day (QID) | INTRAMUSCULAR | Status: DC | PRN
  Administered 2017-04-10: 4 mg via INTRAVENOUS
  Filled 2017-04-10 (×2): qty 2

## 2017-04-10 MED ORDER — SODIUM CHLORIDE 0.9 % IV SOLN
INTRAVENOUS | Status: AC
  Administered 2017-04-10 – 2017-04-11 (×2): via INTRAVENOUS

## 2017-04-10 MED ORDER — GI COCKTAIL ~~LOC~~
30.0000 mL | Freq: Once | ORAL | Status: AC
  Administered 2017-04-10: 30 mL via ORAL

## 2017-04-10 MED ORDER — PANTOPRAZOLE SODIUM 40 MG IV SOLR
40.0000 mg | INTRAVENOUS | Status: DC
  Administered 2017-04-10: 40 mg via INTRAVENOUS
  Filled 2017-04-10 (×2): qty 40

## 2017-04-10 MED ORDER — ENOXAPARIN SODIUM 40 MG/0.4ML ~~LOC~~ SOLN
40.0000 mg | SUBCUTANEOUS | Status: DC
  Administered 2017-04-10: 40 mg via SUBCUTANEOUS
  Filled 2017-04-10: qty 0.4

## 2017-04-10 MED ORDER — GI COCKTAIL ~~LOC~~
ORAL | Status: AC
  Filled 2017-04-10: qty 30

## 2017-04-10 MED ORDER — FLUOXETINE HCL 20 MG PO TABS
20.0000 mg | ORAL_TABLET | Freq: Every day | ORAL | Status: DC
  Administered 2017-04-11: 20 mg via ORAL
  Filled 2017-04-10 (×4): qty 1

## 2017-04-10 MED ORDER — THIAMINE HCL 100 MG/ML IJ SOLN: 100.0000 mg | Freq: Every day | INTRAMUSCULAR | Status: DC

## 2017-04-10 MED ORDER — ASPIRIN EC 325 MG PO TBEC
325.0000 mg | DELAYED_RELEASE_TABLET | Freq: Every day | ORAL | Status: DC
  Administered 2017-04-10 – 2017-04-11 (×2): 325 mg via ORAL
  Filled 2017-04-10 (×2): qty 1

## 2017-04-10 MED ORDER — ADULT MULTIVITAMIN W/MINERALS CH
1.0000 | ORAL_TABLET | Freq: Every day | ORAL | Status: DC
  Administered 2017-04-10 – 2017-04-11 (×2): 1 via ORAL
  Filled 2017-04-10 (×2): qty 1

## 2017-04-10 MED ORDER — VITAMIN B-1 100 MG PO TABS
100.0000 mg | ORAL_TABLET | Freq: Every day | ORAL | Status: DC
  Administered 2017-04-10 – 2017-04-11 (×2): 100 mg via ORAL
  Filled 2017-04-10 (×2): qty 1

## 2017-04-10 MED ORDER — LORAZEPAM 1 MG PO TABS: 1.0000 mg | ORAL_TABLET | Freq: Four times a day (QID) | ORAL | Status: DC | PRN

## 2017-04-10 MED ORDER — ENSURE ENLIVE PO LIQD
237.0000 mL | Freq: Two times a day (BID) | ORAL | Status: DC
  Administered 2017-04-11: 237 mL via ORAL

## 2017-04-10 MED ORDER — FLUDROCORTISONE ACETATE 0.1 MG PO TABS
0.1000 mg | ORAL_TABLET | Freq: Two times a day (BID) | ORAL | Status: DC
  Administered 2017-04-10 – 2017-04-11 (×2): 0.1 mg via ORAL
  Filled 2017-04-10 (×7): qty 1

## 2017-04-10 MED ORDER — AMOXICILLIN 250 MG PO CAPS
500.0000 mg | ORAL_CAPSULE | Freq: Three times a day (TID) | ORAL | Status: DC
  Administered 2017-04-10 – 2017-04-11 (×2): 500 mg via ORAL
  Filled 2017-04-10 (×2): qty 2

## 2017-04-10 MED ORDER — ACETAMINOPHEN 650 MG RE SUPP: 650.0000 mg | Freq: Four times a day (QID) | RECTAL | Status: DC | PRN

## 2017-04-10 MED ORDER — ACETAMINOPHEN 325 MG PO TABS
650.0000 mg | ORAL_TABLET | Freq: Four times a day (QID) | ORAL | Status: DC | PRN
  Administered 2017-04-11: 650 mg via ORAL
  Filled 2017-04-10: qty 2

## 2017-04-10 MED ORDER — FOLIC ACID 1 MG PO TABS
1.0000 mg | ORAL_TABLET | Freq: Every day | ORAL | Status: DC
  Administered 2017-04-10 – 2017-04-11 (×2): 1 mg via ORAL
  Filled 2017-04-10 (×2): qty 1

## 2017-04-10 MED ORDER — ENSURE ENLIVE PO LIQD: 237.0000 mL | Freq: Two times a day (BID) | ORAL | Status: DC

## 2017-04-10 MED ORDER — METOPROLOL TARTRATE 25 MG PO TABS
25.0000 mg | ORAL_TABLET | Freq: Two times a day (BID) | ORAL | Status: DC
  Administered 2017-04-10 – 2017-04-11 (×2): 25 mg via ORAL
  Filled 2017-04-10 (×2): qty 1

## 2017-04-10 MED ORDER — LORAZEPAM 2 MG/ML IJ SOLN: 1.0000 mg | Freq: Four times a day (QID) | INTRAMUSCULAR | Status: DC | PRN

## 2017-04-10 MED ORDER — ONDANSETRON HCL 4 MG PO TABS: 4.0000 mg | ORAL_TABLET | Freq: Four times a day (QID) | ORAL | Status: DC | PRN

## 2017-04-10 NOTE — H&P (Signed)
[facility] History and Physical  Tristan Cook WVP:[id number] DOB: Jul 20, 1960 DOA: 04/10/2017  Referring physician:  PCP: Soyla Dryer, PA-C   Chief Complaint: "I can't walk."  HPI: Tristan Cook is a 57 y.o. male past medical history significant for bilateral lumbar radiculopathy, chronic back pain and uses a wheelchair presents the emergency room with chief complaint of inability to walk.  She denies any fall or trauma.  Just has had trouble walking over the last 2 days.  No recent illness.  No medication changes except for stopping supplemental vitamins.  ED Course:   Review of Systems:  As per HPI otherwise 10 point review of systems negative.    Past Medical History:  Diagnosis Date  . Abdominal pain   . Adenomatous colon polyp 2008  . Arthritis   . Back pain   . Bilateral lumbar radiculopathy   . Blurred vision, bilateral   . Chronic back pain   . Constipation   . Depression   . GERD (gastroesophageal reflux disease)   . Gout   . Hemorrhoids   . High cholesterol   . Hypertension   . Neck pain   . Neuropathy of both feet   . Numbness    rectal  . Numbness of legs   . Substance abuse ([place])    h/o excessive alcohol use; pt says he limits use to one to 2 beers daily he currently   Past Surgical History:  Procedure Laterality Date  . COLONOSCOPY  10/09/2006   3 mm pedunculated sigmoid colon polyp removed/8-mm sessile hepatic flexure polyp (tubular villous adenoma) removed/6-mm  descending colon polyp removed/small internal hemorrhoids  . COLONOSCOPY  02/28/2011   NIO:EVOJJK, multiple in the rectum/Internal hemorrhoids, MODERATE-CAUSING RECTAL BLEEDING  . COLONOSCOPY N/A 07/05/2016   Procedure: COLONOSCOPY;  Surgeon: [name], MD;  [facility];  Service: Endoscopy;  Laterality: N/A;  11:15am  . 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 Mild erosive gastrtitis most likely due  to indomethacin   . FINGER SURGERY Right    4th and 5th digit  . FLEXIBLE SIGMOIDOSCOPY  01/02/2012   SLF: 1. the colonic mucosa appeared normal in the sigmoid colon 2. Large internal hemorrhoids: cause for rectal bleeding/pain . s/p banding X 3   . HAND SURGERY    . SAVORY DILATION N/A 07/21/2014   Procedure: SAVORY DILATION;  Surgeon: [name], MD;  Location: [facility];  Service: Endoscopy;  Laterality: N/A;   Social History:  reports that he quit smoking about 12 years ago. His smoking use included cigarettes. He has a 49.50 pack-year smoking history. His smokeless tobacco use includes snuff. He reports that he drinks about 3.6 oz of alcohol per week. He reports that he does not use drugs.  Allergies  Allergen Reactions  . Benadryl [Diphenhydramine Hcl (Sleep)] Other (See Comments)    Palpitations    Family History  Problem Relation Age of Onset  . Hypertension Mother   . Hypertension Father   . Stroke Father   . Hypertension Sister   . Hypertension Brother   . Cancer Brother   . Cancer Brother        throat cancer  . Hypertension Brother   . Diabetes Maternal Grandmother   . Colon cancer Neg Hx      Prior to Admission medications   Medication Sig Start Date End Date Taking? Authorizing Provider  amoxicillin (AMOXIL) 500 MG capsule Take 1 capsule (  500 mg total) by mouth 3 (three) times daily. Patient taking differently: Take 500 mg by mouth daily.  04/03/17  Yes Lily Kocher, PA-C  aspirin EC 81 MG tablet Take 162 mg by mouth daily.   Yes [provider]  cholecalciferol (VITAMIN D) 1000 units tablet Take 1,000 Units by mouth daily.   Yes [provider]  FLUoxetine (PROZAC) 20 MG tablet Take 1 tablet (20 mg total) by mouth daily. 09/12/16  Yes Soyla Dryer, PA-C  folic acid (FOLVITE) 601 MCG tablet Take 400 mcg by mouth daily.   Yes [provider]  meclizine (ANTIVERT) 25 MG tablet Take 1 tablet (25 mg total) by mouth 3 (three) times  daily as needed for dizziness. 04/03/17  Yes Lily Kocher, PA-C  metoprolol tartrate (LOPRESSOR) 25 MG tablet Take 1 tablet (25 mg total) by mouth 2 (two) times daily. 02/14/17 05/15/17 Yes Rolland Porter, MD  pyridoxine (B-6) 100 MG tablet Take 100 mg by mouth daily.   Yes [provider]  Turmeric 500 MG TABS Take 1 tablet by mouth daily.   Yes [provider]  vitamin B-12 (CYANOCOBALAMIN) 1000 MCG tablet Take 1,000 mcg by mouth daily.   Yes [provider]  vitamin E 400 UNIT capsule Take 400 Units by mouth daily.   Yes [provider]  aspirin EC 325 MG tablet Take 1 tablet (325 mg total) daily by mouth. Patient not taking: Reported on 04/10/2017 01/17/17   Melvenia Beam, MD  fludrocortisone (FLORINEF) 0.1 MG tablet Take 1 tablet (0.1 mg total) by mouth 2 (two) times daily. Patient not taking: Reported on 02/22/2017 12/05/16   Herminio Commons, MD  testosterone cypionate (DEPOTESTOSTERONE CYPIONATE) 200 MG/ML injection Inject 100 mg into the muscle 2 (two) times a week. 05/04/16   [provider]  traMADol (ULTRAM) 50 MG tablet Take 1 tablet (50 mg total) at bedtime as needed by mouth. Patient not taking: Reported on 04/03/2017 01/17/17   Melvenia Beam, MD  lisinopril-hydrochlorothiazide [facility]) 10-12.5 MG per tablet One tablet once daily Dose increase effective 04/23/2012 04/23/12 08/12/13  Fayrene Helper, MD   Physical Exam: Vitals:   04/10/17 1157 04/10/17 1627 04/10/17 1745 04/10/17 2045  BP: (!) 153/104 (!) 161/113 (!) 152/108   Pulse: 100 67 87   Resp: 18 18 18    Temp: 97.9 F (36.6 C)     TempSrc: Oral     SpO2: 100% 97% 98% 100%    Wt Readings from Last 3 Encounters:  04/03/17 74.8 kg (165 lb)  02/22/17 76 kg (167 lb 8 oz)  02/14/17 72.6 kg (160 lb)    General:  Appears calm and comfortable; A&Ox3 Eyes:  PERRL, EOMI, normal lids, iris ENT:  grossly normal hearing, lips & tongue Neck:  no LAD, masses or  thyromegaly Cardiovascular:  RRR, no m/r/g. No LE edema.  Respiratory:  CTA bilaterally, no w/r/r. Normal respiratory effort. Abdomen:  soft, ntnd Skin:  no rash or induration seen on limited exam Musculoskeletal:  grossly normal tone BUE/BLE Psychiatric:  grossly normal mood and affect, speech fluent and appropriate Neurologic:  CN 2-12 grossly intact, moves all extremities in coordinated fashion. 4/5 LE str bil          Labs on Admission:  Basic Metabolic Panel: Recent Labs  Lab 04/10/17 1307  NA 136  K 3.3*  CL 95*  CO2 24  GLUCOSE 112*  BUN <5*  CREATININE 0.94  CALCIUM 9.3   Liver Function Tests:  Recent Labs  Lab 04/10/17 1615  AST 286*  ALT 177*  ALKPHOS 115  BILITOT 3.0*  PROT 7.4  ALBUMIN 3.2*   No results for input(s): LIPASE, AMYLASE in the last 168 hours. No results for input(s): AMMONIA in the last 168 hours. CBC: Recent Labs  Lab 04/10/17 1307  WBC 8.2  HGB 13.6  HCT 38.6*  MCV 99.5  PLT 314   Cardiac Enzymes: No results for input(s): CKTOTAL, CKMB, CKMBINDEX, TROPONINI in the last 168 hours.  BNP (last 3 results) No results for input(s): BNP in the last 8760 hours.  ProBNP (last 3 results) No results for input(s): PROBNP in the last 8760 hours.   Serum creatinine: 0.94 mg/dL 04/10/17 1307 Estimated creatinine clearance: 95 mL/min  CBG: No results for input(s): GLUCAP in the last 168 hours.  Radiological Exams on Admission: No results found.  EKG: no new  Assessment/Plan Active Problems:   Weakness of both legs  Leg weakness PT ordered Flare of chronic problem  Hypertension When necessary hydralazine 10 mg IV as needed for severe blood pressure Cont lopressor  GERD PPI ordered  Depression Cont prozac No SI/HI  Adrenal issues Cont florinef  Dental infxn Cont amox  Transaminitis RUQ [place]  ETOH abuse hx CIWA protocol  Code Status: FC DVT Prophylaxis: lovenox Family Communication: none available Disposition  Plan: Pending Improvement  Status: obs medsurg  Elwin Mocha, MD Family Medicine [facility] [email or web address] Password [email or web address]

## 2017-04-10 NOTE — ED Notes (Signed)
Attempted to ambulate pt. Pt extremely shakey and unsteady on feet. Pt states that he mostly uses a wheelchair to get around but uses his cane occasionally

## 2017-04-10 NOTE — Telephone Encounter (Signed)
Called patient. He stated that for 2 weeks he has had gradual weakness of BLE. He is having to use his friend's shoulder for support. Prior to this he had some weakness but was able to get around. He stated that he has no insurance. He has been contacting his lawyer to try to speed up his disability. He also reported his gums are infected. Patient was advised that due to the significant decline over the last 2 weeks, patient should go to the ED. He verbalized understanding and stated he would try. I stated if he didn't have a ride he could call EMS. He verbalized understanding and will update our office.   Dr. Jaynee Eagles aware of weakness and she advised ED.

## 2017-04-10 NOTE — ED Notes (Signed)
Pt given ginger ale per fluid /PO challenge

## 2017-04-10 NOTE — ED Provider Notes (Signed)
[facility] EMERGENCY DEPARTMENT Provider Note   CSN: [id number] Arrival date & time: 04/10/17  1141     History   Chief Complaint Chief Complaint  Patient presents with  . Leg Pain    HPI JOSHAU CODE is a 57 y.o. male.  The patient presents for evaluation of weakness in his legs gradually worse for several weeks and acutely worse over the last 2 days.  He states that he is unable to walk at all because of leg weakness.  He has previously been evaluated by neurology, treated with gabapentin and failed because of medication intolerance.  He lives alone, and gets help from friends to do things around the house, and get to the store for food.  He sees a primary care doctor in [place] and states that getting to see the neurologist Lady Gary is too hard because of transportation difficulties.  Patient denies fever, chills, cough, chest pain, headache, back pain, nausea or vomiting.  He is taking his usual medications.  There are no other known modifying factors.  HPI  Past Medical History:  Diagnosis Date  . Abdominal pain   . Adenomatous colon polyp 2008  . Arthritis   . Back pain   . Bilateral lumbar radiculopathy   . Blurred vision, bilateral   . Chronic back pain   . Constipation   . Depression   . GERD (gastroesophageal reflux disease)   . Gout   . Hemorrhoids   . High cholesterol   . Hypertension   . Neck pain   . Neuropathy of both feet   . Numbness    rectal  . Numbness of legs   . Substance abuse ([place])    h/o excessive alcohol use; pt says he limits use to one to 2 beers daily he currently    Patient Active Problem List   Diagnosis Date Noted  . Weakness of both legs 04/10/2017  . Vitamin B deficiency 01/22/2017  . 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 Stroke, small vessel ([place]) 01/17/2017  . 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 Vitamin B12 deficiency neuropathy ([place]) 01/17/2017  . History of colonic polyps 06/14/2016  .  Elevated blood pressure reading 06/14/2016  . Hemorrhoids 09/20/2015  . Depression 08/03/2015  . Back pain with radiation 06/15/2015  . Insomnia 06/03/2015  . Hyperlipidemia LDL goal <130 04/25/2015  . 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 Fatigue 03/29/2015  . Allergic rhinitis 02/10/2015  . Esophageal stricture 10/22/2014  . Overweight (BMI 25.0-29.9) 10/06/2013  . GERD (gastroesophageal reflux disease) 04/23/2012  . Hemorrhoids, internal 11/02/2011  . Essential hypertension 07/25/2007  . Neck pain on left side 07/25/2007    Past Surgical History:  Procedure Laterality Date  . COLONOSCOPY  10/09/2006   3 mm pedunculated sigmoid colon polyp removed/8-mm sessile hepatic flexure polyp (tubular villous adenoma) removed/6-mm  descending colon polyp removed/small internal hemorrhoids  . COLONOSCOPY  02/28/2011   [id number]:[id number], multiple in the rectum/Internal hemorrhoids, MODERATE-CAUSING RECTAL BLEEDING  . COLONOSCOPY N/A 07/05/2016   Procedure: COLONOSCOPY;  Surgeon: [name], MD;  [facility];  Service: Endoscopy;  Laterality: N/A;  11:15am  . 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 Mild erosive gastrtitis most likely due to indomethacin   . FINGER SURGERY Right    4th and 5th digit  . FLEXIBLE SIGMOIDOSCOPY  01/02/2012   SLF: 1. the colonic mucosa appeared normal in the sigmoid colon 2. Large internal hemorrhoids: cause for rectal  bleeding/pain . s/p banding X 3   . HAND SURGERY    . SAVORY DILATION N/A 07/21/2014   Procedure: SAVORY DILATION;  Surgeon: [name], MD;  [facility]: [facility];  Service: Endoscopy;  Laterality: N/A;       Home Medications    Prior to Admission medications   Medication Sig Start Date End Date Taking? Authorizing Provider  amoxicillin (AMOXIL) 500 MG capsule Take 1 capsule (500 mg total) by mouth 3 (three) times daily. Patient taking differently: Take 500 mg by mouth  daily.  04/03/17  Yes Lily Kocher, PA-C  aspirin EC 81 MG tablet Take 162 mg by mouth daily.   Yes [provider]  cholecalciferol (VITAMIN D) 1000 units tablet Take 1,000 Units by mouth daily.   Yes [provider]  FLUoxetine (PROZAC) 20 MG tablet Take 1 tablet (20 mg total) by mouth daily. 09/12/16  Yes Soyla Dryer, PA-C  folic acid (FOLVITE) 409 MCG tablet Take 400 mcg by mouth daily.   Yes [provider]  meclizine (ANTIVERT) 25 MG tablet Take 1 tablet (25 mg total) by mouth 3 (three) times daily as needed for dizziness. 04/03/17  Yes Lily Kocher, PA-C  metoprolol tartrate (LOPRESSOR) 25 MG tablet Take 1 tablet (25 mg total) by mouth 2 (two) times daily. 02/14/17 05/15/17 Yes Rolland Porter, MD  pyridoxine (B-6) 100 MG tablet Take 100 mg by mouth daily.   Yes [provider]  Turmeric 500 MG TABS Take 1 tablet by mouth daily.   Yes [provider]  vitamin B-12 (CYANOCOBALAMIN) 1000 MCG tablet Take 1,000 mcg by mouth daily.   Yes [provider]  vitamin E 400 UNIT capsule Take 400 Units by mouth daily.   Yes [provider]  aspirin EC 325 MG tablet Take 1 tablet (325 mg total) daily by mouth. Patient not taking: Reported on 04/10/2017 01/17/17   Melvenia Beam, MD  fludrocortisone (FLORINEF) 0.1 MG tablet Take 1 tablet (0.1 mg total) by mouth 2 (two) times daily. Patient not taking: Reported on 02/22/2017 12/05/16   Herminio Commons, MD  testosterone cypionate (DEPOTESTOSTERONE CYPIONATE) 200 MG/ML injection Inject 100 mg into the muscle 2 (two) times a week. 05/04/16   [provider]  traMADol (ULTRAM) 50 MG tablet Take 1 tablet (50 mg total) at bedtime as needed by mouth. Patient not taking: Reported on 04/03/2017 01/17/17   Melvenia Beam, MD  lisinopril-hydrochlorothiazide [facility]) 10-12.5 MG per tablet One tablet once daily Dose increase effective 04/23/2012 04/23/12 08/12/13  Fayrene Helper, MD     Family History Family History  Problem Relation Age of Onset  . Hypertension Mother   . Hypertension Father   . Stroke Father   . Hypertension Sister   . Hypertension Brother   . Cancer Brother   . Cancer Brother        throat cancer  . Hypertension Brother   . Diabetes Maternal Grandmother   . Colon cancer Neg Hx     Social History Social History   Tobacco Use  . Smoking status: Former Smoker    Packs/day: 1.50    Years: 33.00    Pack years: 49.50    Types: Cigarettes    Last attempt to quit: 08/16/2004    Years since quitting: 12.6  . Smokeless tobacco: Current User    Types: Snuff  . Tobacco comment: Dips every once in a while  Substance Use Topics  . Alcohol use: Yes  Alcohol/week: 3.6 oz    Types: 6 Cans of beer per week    Comment: drinks 6 pack of beer on weekends  . Drug use: No     Allergies   Benadryl [diphenhydramine hcl (sleep)]   Review of Systems Review of Systems  All other systems reviewed and are negative.    Physical Exam Updated Vital Signs BP (!) 152/108   Pulse 87   Temp 97.9 F (36.6 C) (Oral)   Resp 18   SpO2 98%   Physical Exam  Constitutional: He is oriented to person, place, and time. He appears well-developed. No distress.  He appears under nourished  HENT:  Head: Normocephalic and atraumatic.  Right Ear: External ear normal.  Left Ear: External ear normal.  Eyes: Conjunctivae and EOM are normal. Pupils are equal, round, and reactive to light.  Neck: Normal range of motion and phonation normal. Neck supple.  Cardiovascular: Normal rate, regular rhythm and normal heart sounds.  Pulmonary/Chest: Effort normal and breath sounds normal. He exhibits no bony tenderness.  Abdominal: Soft. There is no tenderness.  Musculoskeletal: Normal range of motion.  Normal range of motion of arms bilaterally.  Normal strength upper extremities.  On lower extremity strength testing he is unable to lift either leg off the stretcher.   When I lift his legs up, they immediately drop when I let them go.  Neurological: He is alert and oriented to person, place, and time. No cranial nerve deficit or sensory deficit. He exhibits normal muscle tone. Coordination normal.  Skin: Skin is warm, dry and intact.  Psychiatric: He has a normal mood and affect. His behavior is normal. Judgment and thought content normal.  Nursing note and vitals reviewed.    ED Treatments / Results  Labs (all labs ordered are listed, but only abnormal results are displayed) Labs Reviewed  BASIC METABOLIC PANEL - Abnormal; Notable for the following components:      Result Value   Potassium 3.3 (*)    Chloride 95 (*)    Glucose, Bld 112 (*)    BUN <5 (*)    Anion gap 17 (*)    All other components within normal limits  CBC - Abnormal; Notable for the following components:   RBC 3.88 (*)    HCT 38.6 (*)    MCH 35.1 (*)    All other components within normal limits  URINALYSIS, ROUTINE W REFLEX MICROSCOPIC - Abnormal; Notable for the following components:   Color, Urine AMBER (*)    All other components within normal limits  HEPATIC FUNCTION PANEL - Abnormal; Notable for the following components:   Albumin 3.2 (*)    AST 286 (*)    ALT 177 (*)    Total Bilirubin 3.0 (*)    Bilirubin, Direct 1.0 (*)    Indirect Bilirubin 2.0 (*)    All other components within normal limits  ETHANOL  RAPID URINE DRUG SCREEN, HOSP PERFORMED  CBC  CREATININE, SERUM  COMPREHENSIVE METABOLIC PANEL  CBC    EKG  EKG Interpretation None       Radiology No results found.  Procedures Procedures (including critical care time)  Medications Ordered in ED Medications  fludrocortisone (FLORINEF) tablet 0.1 mg (not administered)  aspirin EC tablet 325 mg (not administered)  FLUoxetine (PROZAC) tablet 20 mg (not administered)  metoprolol tartrate (LOPRESSOR) tablet 25 mg (not administered)  enoxaparin (LOVENOX) injection 40 mg (not administered)  0.9 %   sodium chloride infusion (not administered)  acetaminophen (  TYLENOL) tablet 650 mg (not administered)    Or  acetaminophen (TYLENOL) suppository 650 mg (not administered)  ondansetron (ZOFRAN) tablet 4 mg (not administered)    Or  ondansetron (ZOFRAN) injection 4 mg (not administered)  gi cocktail (Maalox,Lidocaine,Donnatal) (30 mLs Oral Given 04/10/17 1738)     Initial Impression / Assessment and Plan / ED Course  I have reviewed the triage vital signs and the nursing notes.  Pertinent labs & imaging results that were available during my care of the patient were reviewed by me and considered in my medical decision making (see chart for details).  Clinical Course as of Apr 11 1835  Tue Apr 10, 2017  1745 Laboratory workup for weakness with moderate elevation of transaminases.  Albumin low 3.2.  Potassium low 3.3.  Chloride low 95.  Anion gap elevated at 17.  CBC with normal white blood cell count, and normal hemoglobin.  Urine drug screen is negative.  [EW]  1746 Elevated BP: (!) 161/113 [EW]    Clinical Course User Index [EW] Daleen Bo, MD     Patient Vitals for the past 24 hrs:  BP Temp Temp src Pulse Resp SpO2  04/10/17 1745 (!) 152/108 - - 87 18 98 %  04/10/17 1627 (!) 161/113 - - 67 18 97 %  04/10/17 1157 (!) 153/104 97.9 F (36.6 C) Oral 100 18 100 %    6:08 PM Reevaluation with update and discussion. After initial assessment and treatment, an updated evaluation reveals no change in clinical status.  Ambulation trial failed, patient was unable to stand and bear weight on his feet. He admits to ongoing alcohol use.  He told the tech that he has been using a wheelchair and cane at home. Daleen Bo   6:31 PM-Consult complete with hospitalist. Patient case explained and discussed.  He agrees to admit patient for further evaluation and treatment. Call ended at 18: 37   Final Clinical Impressions(s) / ED Diagnoses   Final diagnoses:  Weakness of both lower extremities   Transaminitis  Poor nutrition  Alcohol abuse   Nonspecific extremity weakness.  Evaluation indicative of transaminase elevation, which is at baseline from 1 week ago.  At that time he had been evaluated for dizziness.  Patient has been previously evaluated by neurology who felt that he had nonspecific neuropathy related to polysubstance abuse.  Patient is unable to stand and walk.  No clinical etiology discovered by physical examination or laboratory testing.  Incidental transaminitis, is likely related to ongoing use of alcohol.  No apparent acute infectious or metabolic process.  Mild nutritional deficit with low albumin, potassium and chloride.  Hospitalist consulted for possible admission, to obtain physical therapy and social work evaluations, and possible placement in a nursing care facility.  Nursing Notes Reviewed/ Care Coordinated Applicable Imaging Reviewed Interpretation of Laboratory Data incorporated into ED treatment   Plan: Evaluation for admission by Hospitalist.  ED Discharge Orders    None       Daleen Bo, MD 04/10/17 Bosie Helper

## 2017-04-10 NOTE — ED Triage Notes (Signed)
Pt c/o of leg pain and weakness in both legs for 2 weeks. Pt states "this happens when my neuropathy flares up"

## 2017-04-11 ENCOUNTER — Ambulatory Visit: Payer: Self-pay | Admitting: Physician Assistant

## 2017-04-11 ENCOUNTER — Observation Stay (HOSPITAL_COMMUNITY): Payer: Self-pay

## 2017-04-11 DIAGNOSIS — K760 Fatty (change of) liver, not elsewhere classified: Secondary | ICD-10-CM

## 2017-04-11 DIAGNOSIS — R29898 Other symptoms and signs involving the musculoskeletal system: Secondary | ICD-10-CM

## 2017-04-11 DIAGNOSIS — R2681 Unsteadiness on feet: Secondary | ICD-10-CM

## 2017-04-11 DIAGNOSIS — R531 Weakness: Secondary | ICD-10-CM

## 2017-04-11 LAB — COMPREHENSIVE METABOLIC PANEL
ALBUMIN: 2.5 g/dL — AB (ref 3.5–5.0)
ALK PHOS: 90 U/L (ref 38–126)
ALT: 125 U/L — AB (ref 17–63)
AST: 206 U/L — AB (ref 15–41)
Anion gap: 13 (ref 5–15)
BUN: 7 mg/dL (ref 6–20)
CALCIUM: 8.3 mg/dL — AB (ref 8.9–10.3)
CO2: 26 mmol/L (ref 22–32)
CREATININE: 1.03 mg/dL (ref 0.61–1.24)
Chloride: 97 mmol/L — ABNORMAL LOW (ref 101–111)
GFR calc Af Amer: >60 mL/min (ref >60)
GFR calc non Af Amer: >60 mL/min (ref >60)
GLUCOSE: 100 mg/dL — AB (ref 65–99)
Potassium: 3.4 mmol/L — ABNORMAL LOW (ref 3.5–5.1)
SODIUM: 136 mmol/L (ref 135–145)
Total Bilirubin: 2.5 mg/dL — ABNORMAL HIGH (ref 0.3–1.2)
Total Protein: 5.7 g/dL — ABNORMAL LOW (ref 6.5–8.1)

## 2017-04-11 LAB — CBC
HCT: 32.2 % — ABNORMAL LOW (ref 39.0–52.0)
HEMOGLOBIN: 11.3 g/dL — AB (ref 13.0–17.0)
MCH: 35 pg — AB (ref 26.0–34.0)
MCHC: 35.1 g/dL (ref 30.0–36.0)
MCV: 99.7 fL (ref 78.0–100.0)
Platelets: 264 10*3/uL (ref 150–400)
RBC: 3.23 MIL/uL — ABNORMAL LOW (ref 4.22–5.81)
RDW: 15.4 % (ref 11.5–15.5)
WBC: 6.8 10*3/uL (ref 4.0–10.5)

## 2017-04-11 MED ORDER — ADULT MULTIVITAMIN W/MINERALS CH: 1.0000 | ORAL_TABLET | Freq: Every day | ORAL | Status: DC

## 2017-04-11 MED ORDER — THIAMINE HCL 100 MG PO TABS: 100.0000 mg | ORAL_TABLET | Freq: Every day | ORAL | Status: DC

## 2017-04-11 MED ORDER — PANTOPRAZOLE SODIUM 40 MG PO TBEC
40.0000 mg | DELAYED_RELEASE_TABLET | Freq: Every day | ORAL | Status: DC
  Administered 2017-04-11: 40 mg via ORAL
  Filled 2017-04-11: qty 1

## 2017-04-11 MED ORDER — TRAMADOL HCL 50 MG PO TABS
50.0000 mg | ORAL_TABLET | Freq: Four times a day (QID) | ORAL | Status: DC | PRN
Start: 2017-04-11 — End: 2017-04-11
  Administered 2017-04-11: 50 mg via ORAL
  Filled 2017-04-11: qty 1

## 2017-04-11 NOTE — Plan of Care (Signed)
  Acute Rehab PT Goals(only PT should resolve) Patient Will Transfer Sit To/From Stand 04/11/2017 1352 - Progressing by Lonell Grandchild, PT Flowsheets Taken 04/11/2017 1352  Patient will transfer sit to/from stand with minimal assist Pt Will Transfer Bed To Chair/Chair To Bed 04/11/2017 1352 - Progressing by Lonell Grandchild, PT Flowsheets Taken 04/11/2017 1352  Pt will Transfer Bed to Chair/Chair to Bed with min assist Pt Will Ambulate 04/11/2017 1352 - Progressing by Lonell Grandchild, PT Flowsheets Taken 04/11/2017 1352  Pt will Ambulate with moderate assist;15 feet;with rolling walker  1:53 PM, 04/11/17 Lonell Grandchild, MPT Physical Therapist with [facility] [phone] office [phone] mobile phone

## 2017-04-11 NOTE — Progress Notes (Signed)
Pt discharged home today per Dr. Johnson. Pt's IV site D/C'd and WDL. Pt's VSS. Pt provided with home medication list, discharge instructions and prescriptions. Verbalized understanding. Pt left floor via WC in stable condition accompanied by NT. 

## 2017-04-11 NOTE — Discharge Instructions (Signed)
Follow with Primary MD  Soyla Dryer, PA-C  and other consultant's as instructed your Hospitalist MD  Please get a complete blood count and chemistry panel checked by your Primary MD at your next visit, and again as instructed by your Primary MD.  Get Medicines reviewed and adjusted: Please take all your medications with you for your next visit with your Primary MD  Laboratory/radiological data: Please request your Primary MD to go over all hospital tests and procedure/radiological results at the follow up, please ask your Primary MD to get all Hospital records sent to his/her office.  In some cases, they will be blood work, cultures and biopsy results pending at the time of your discharge. Please request that your primary care M.D. follows up on these results.  Also Note the following: If you experience worsening of your admission symptoms, develop shortness of breath, life threatening emergency, suicidal or homicidal thoughts you must seek medical attention immediately by calling 911 or calling your MD immediately  if symptoms less severe.  You must read complete instructions/literature along with all the possible adverse reactions/side effects for all the Medicines you take and that have been prescribed to you. Take any new Medicines after you have completely understood and accpet all the possible adverse reactions/side effects.   Do not drive when taking Pain medications or sleeping medications (Benzodaizepines)  Do not take more than prescribed Pain, Sleep and Anxiety Medications. It is not advisable to combine anxiety,sleep and pain medications without talking with your primary care practitioner  Special Instructions: If you have smoked or chewed Tobacco  in the last 2 yrs please stop smoking, stop any regular Alcohol  and or any Recreational drug use.  Wear Seat belts while driving.  Please note: You were cared for by a hospitalist during your hospital stay. Once you are  discharged, your primary care physician will handle any further medical issues. Please note that NO REFILLS for any discharge medications will be authorized once you are discharged, as it is imperative that you return to your primary care physician (or establish a relationship with a primary care physician if you do not have one) for your post hospital discharge needs so that they can reassess your need for medications and monitor your lab values.     Alcohol Abuse and Nutrition Alcohol abuse is any pattern of alcohol consumption that harms your health, relationships, or work. Alcohol abuse can affect how your body breaks down and absorbs nutrients from food by causing your liver to work abnormally. Additionally, many people who abuse alcohol do not eat enough carbohydrates, protein, fat, vitamins, and minerals. This can cause poor nutrition (malnutrition) and a lack of nutrients (nutrient deficiencies), which can lead to further complications. Nutrients that are commonly lacking (deficient) among people who abuse alcohol include:  Vitamins. ? Vitamin A. This is stored in your liver. It is important for your vision, metabolism, and ability to fight off infections (immunity). ? B vitamins. These include vitamins such as folate, thiamin, and niacin. These are important in new cell growth and maintenance. ? Vitamin C. This plays an important role in iron absorption, wound healing, and immunity. ? Vitamin D. This is produced by your liver, but you can also get vitamin D from food. Vitamin D is necessary for your body to absorb and use calcium.  Minerals. ? Calcium. This is important for your bones and your heart and blood vessel (cardiovascular) function. ? Iron. This is important for blood, muscle, and nervous system  functioning. ? Magnesium. This plays an important role in muscle and nerve function, and it helps to control blood sugar and blood pressure. ? Zinc. This is important for the normal  function of your nervous system and digestive system (gastrointestinal tract).  Nutrition is an essential component of therapy for alcohol abuse. Your health care provider or dietitian will work with you to design a plan that can help restore nutrients to your body and prevent potential complications. What is my plan? Your dietitian may develop a specific diet plan that is based on your condition and any other complications you may have. A diet plan will commonly include:  A balanced diet. ? Grains: 6-8 oz per day. ? Vegetables: 2-3 cups per day. ? Fruits: 1-2 cups per day. ? Meat and other protein: 5-6 oz per day. ? Dairy: 2-3 cups per day.  Vitamin and mineral supplements.  What do I need to know about alcohol and nutrition?  Consume foods that are high in antioxidants, such as grapes, berries, nuts, green tea, and dark green and orange vegetables. This can help to counteract some of the stress that is placed on your liver by consuming alcohol.  Avoid food and drinks that are high in fat and sugar. Foods such as sugared soft drinks, salty snack foods, and candy contain empty calories. This means that they lack important nutrients such as protein, fiber, and vitamins.  Eat frequent meals and snacks. Try to eat 5-6 small meals each day.  Eat a variety of fresh fruits and vegetables each day. This will help you get plenty of water, fiber, and vitamins in your diet.  Drink plenty of water and other clear fluids. Try to drink at least 48-64 oz (1.5-2 L) of water per day.  If you are a vegetarian, eat a variety of protein-rich foods. Pair whole grains with plant-based proteins at meals and snacks to obtain the greatest nutrient benefit from your food. For example, eat rice with beans, put peanut butter on whole-grain toast, or eat oatmeal with sunflower seeds.  Soak beans and whole grains overnight before cooking. This can help your body to absorb the nutrients more easily.  Include foods  fortified with vitamins and minerals in your diet. Commonly fortified foods include milk, orange juice, cereal, and bread.  If you are malnourished, your dietitian may recommend a high-protein, high-calorie diet. This may include: ? 2,000-3,000 calories (kilocalories) per day. ? 70-100 grams of protein per day.  Your health care provider may recommend a complete nutritional supplement beverage. This can help to restore calories, protein, and vitamins to your body. Depending on your condition, you may be advised to consume this instead of or in addition to meals.  Limit your intake of caffeine. Replace drinks like coffee and black tea with decaffeinated coffee and herbal tea.  Eat a variety of foods that are high in omega fatty acids. These include fish, nuts and seeds, and soybeans. These foods may help your liver to recover and may also stabilize your mood.  Certain medicines may cause changes in your appetite, taste, and weight. Work with your health care provider and dietitian to make any adjustments to your medicines and diet plan.  Include other healthy lifestyle choices in your daily routine. ? Be physically active. ? Get enough sleep. ? Spend time doing activities that you enjoy.  If you are unable to take in enough food and calories by mouth, your health care provider may recommend a feeding tube. This is a  tube that passes through your nose and throat, directly into your stomach. Nutritional supplement beverages can be given to you through the feeding tube to help you get the nutrients you need.  Take vitamin or mineral supplements as recommended by your health care provider. What foods can I eat? Grains Enriched pasta. Enriched rice. Fortified whole-grain bread. Fortified whole-grain cereal. Barley. Brown rice. Quinoa. [place]. Vegetables All fresh, frozen, and canned vegetables. Spinach. Kale. Artichoke. Carrots. Winter squash and pumpkin. Sweet potatoes. Broccoli. Cabbage.  Cucumbers. Tomatoes. Sweet peppers. Green beans. Peas. Corn. Fruits All fresh and frozen fruits. Berries. Grapes. Mango. Papaya. Guava. Cherries. Apples. Bananas. Peaches. Plums. Pineapple. Watermelon. Cantaloupe. Oranges. Avocado. Meats and Other Protein Sources Beef liver. Lean beef. Pork. Fresh and canned chicken. Fresh fish. Oysters. Sardines. Canned tuna. Shrimp. Eggs with yolks. Nuts and seeds. Peanut butter. Beans and lentils. Soybeans. Tofu. Dairy Whole, low-fat, and nonfat milk. Whole, low-fat, and nonfat yogurt. Cottage cheese. Sour cream. Hard and soft cheeses. Beverages Water. Herbal tea. Decaffeinated coffee. Decaffeinated green tea. 100% fruit juice. 100% vegetable juice. Instant breakfast shakes. Condiments Ketchup. Mayonnaise. Mustard. Salad dressing. Barbecue sauce. Sweets and Desserts Sugar-free ice cream. Sugar-free pudding. Sugar-free gelatin. Fats and Oils Butter. Vegetable oil, flaxseed oil, olive oil, and walnut oil. Other Complete nutrition shakes. Protein bars. Sugar-free gum. The items listed above may not be a complete list of recommended foods or beverages. Contact your dietitian for more options. What foods are not recommended? Grains Sugar-sweetened breakfast cereals. Flavored instant oatmeal. Fried breads. Vegetables Breaded or deep-fried vegetables. Fruits Dried fruit with added sugar. Candied fruit. Canned fruit in syrup. Meats and Other Protein Sources Breaded or deep-fried meats. Dairy Flavored milks. Fried cheese curds or fried cheese sticks. Beverages Alcohol. Sugar-sweetened soft drinks. Sugar-sweetened tea. Caffeinated coffee and tea. Condiments Sugar. Honey. Agave nectar. Molasses. Sweets and Desserts Chocolate. Cake. Cookies. Candy. Other Potato chips. Pretzels. Salted nuts. Candied nuts. The items listed above may not be a complete list of foods and beverages to avoid. Contact your dietitian for more information. This information is not  intended to replace advice given to you by your health care provider. Make sure you discuss any questions you have with your health care provider. Document Released: 12/22/2004 Document Revised: 07/07/2015 Document Reviewed: 09/30/2013 Elsevier Interactive Patient Education  2018 [employer].   Alcohol Use Disorder Alcohol use disorder is when your drinking disrupts your daily life. When you have this condition, you drink too much alcohol and you cannot control your drinking. Alcohol use disorder can cause serious problems with your physical health. It can affect your brain, heart, liver, pancreas, immune system, stomach, and intestines. Alcohol use disorder can increase your risk for certain cancers and cause problems with your mental health, such as depression, anxiety, psychosis, delirium, and dementia. People with this disorder risk hurting themselves and others. What are the causes? This condition is caused by drinking too much alcohol over time. It is not caused by drinking too much alcohol only one or two times. Some people with this condition drink alcohol to cope with or escape from negative life events. Others drink to relieve pain or symptoms of mental illness. What increases the risk? You are more likely to develop this condition if:  You have a family history of alcohol use disorder.  Your culture encourages drinking to the point of intoxication, or makes alcohol easy to get.  You had a mood or conduct disorder in childhood.  You have been a victim of abuse.  You are an adolescent and: ? You have poor grades or difficulties in school. ? Your caregivers do not talk to you about saying no to alcohol, or supervise your activities. ? You are impulsive or you have trouble with self-control.  What are the signs or symptoms? Symptoms of this condition include:  Drinkingmore than you want to.  Drinking for longer than you want to.  Trying several times to drink less or to  control your drinking.  Spending a lot of time getting alcohol, drinking, or recovering from drinking.  Craving alcohol.  Having problems at work, at school, or at home due to drinking.  Having problems in relationships due to drinking.  Drinking when it is dangerous to drink, such as before driving a car.  Continuing to drink even though you know you might have a physical or mental problem related to drinking.  Needing more and more alcohol to get the same effect you want from the alcohol (building up tolerance).  Having symptoms of withdrawal when you stop drinking. Symptoms of withdrawal include: ? Fatigue. ? Nightmares. ? Trouble sleeping. ? Depression. ? Anxiety. ? Fever. ? Seizures. ? Severe confusion. ? Feeling or seeing things that are not there (hallucinations). ? Tremors. ? Rapid heart rate. ? Rapid breathing. ? High blood pressure.  Drinking to avoid symptoms of withdrawal.  How is this diagnosed? This condition is diagnosed with an assessment. Your health care provider may start the assessment by asking three or four questions about your drinking. Your health care provider may perform a physical exam or do lab tests to see if you have physical problems resulting from alcohol use. She or he may refer you to a mental health professional for evaluation. How is this treated? Some people with alcohol use disorder are able to reduce their alcohol use to low-risk levels. Others need to completely quit drinking alcohol. When necessary, mental health professionals with specialized training in substance use treatment can help. Your health care provider can help you decide how severe your alcohol use disorder is and what type of treatment you need. The following forms of treatment are available:  Detoxification. Detoxification involves quitting drinking and using prescription medicines within the first week to help lessen withdrawal symptoms. This treatment is important for  people who have had withdrawal symptoms before and for heavy drinkers who are likely to have withdrawal symptoms. Alcohol withdrawal can be dangerous, and in severe cases, it can cause death. Detoxification may be provided in a home, community, or primary care setting, or in a hospital or substance use treatment facility.  Counseling. This treatment is also called talk therapy. It is provided by substance use treatment counselors. A counselor can address the reasons you use alcohol and suggest ways to keep you from drinking again or to prevent problem drinking. The goals of talk therapy are to: ? Find healthy activities and ways for you to cope with stress. ? Identify and avoid the things that trigger your alcohol use. ? Help you learn how to handle cravings.  Medicines.Medicines can help treat alcohol use disorder by: ? Decreasing alcohol cravings. ? Decreasing the positive feeling you have when you drink alcohol. ? Causing an uncomfortable physical reaction when you drink alcohol (aversion therapy).  Support groups. Support groups are led by people who have quit drinking. They provide emotional support, advice, and guidance.  These forms of treatment are often combined. Some people with this condition benefit from a combination of treatments provided by specialized substance  use treatment centers. Follow these instructions at home:  Take over-the-counter and prescription medicines only as told by your health care provider.  Check with your health care provider before starting any new medicines.  Ask friends and family members not to offer you alcohol.  Avoid situations where alcohol is served, including gatherings where others are drinking alcohol.  Create a plan for what to do when you are tempted to use alcohol.  Find hobbies or activities that you enjoy that do not include alcohol.  Keep all follow-up visits as told by your health care provider. This is important. How is this  prevented?  If you drink, limit alcohol intake to no more than 1 drink a day for nonpregnant women and 2 drinks a day for men. One drink equals 12 oz of beer, 5 oz of wine, or 1 oz of hard liquor.  If you have a mental health condition, get treatment and support.  Do not give alcohol to adolescents.  If you are an adolescent: ? Do not drink alcohol. ? Do not be afraid to say no if someone offers you alcohol. Speak up about why you do not want to drink. You can be a positive role model for your friends and set a good example for those around you by not drinking alcohol. ? If your friends drink, spend time with others who do not drink alcohol. Make new friends who do not use alcohol. ? Find healthy ways to manage stress and emotions, such as meditation or deep breathing, exercise, spending time in nature, listening to music, or talking with a trusted friend or family member. Contact a health care provider if:  You are not able to take your medicines as told.  Your symptoms get worse.  You return to drinking alcohol (relapse) and your symptoms get worse. Get help right away if:  You have thoughts about hurting yourself or others. If you ever feel like you may hurt yourself or others, or have thoughts about taking your own life, get help right away. You can go to your nearest emergency department or call:  Your local emergency services (911 in the [place]  A suicide crisis helpline, such as the [place] at 867-857-0718. This is open 24 hours a day.  Summary  Alcohol use disorder is when your drinking disrupts your daily life. When you have this condition, you drink too much alcohol and you cannot control your drinking.  Treatment may include detoxification, counseling, medicine, and support groups.  Ask friends and family members not to offer you alcohol. Avoid situations where alcohol is served.  Get help right away if you have thoughts about hurting  yourself or others. This information is not intended to replace advice given to you by your health care provider. Make sure you discuss any questions you have with your health care provider. Document Released: 04/06/2004 Document Revised: 11/25/2015 Document Reviewed: 11/25/2015 Elsevier Interactive Patient Education  2018 [employer].  Malnutrition Malnutrition is any condition in which nutrition is poor. There are many forms of malnutrition. A common form is having too little of one kind of nutrient (nutritional deficiency). Nutrients include proteins, minerals, carbohydrates, fats, and vitamins. They provide the body with energy and keep the body working normally. Malnutrition ranges from mild to severe. The condition affects the body's defense system (immune system). Because of this, people who are malnourished are more likely to develop health problems and get sick. What are the causes? Causes of malnutrition include:  Eating an unbalanced diet.  Eating too much of certain foods.  Eating too little.  Conditions that decrease the body's ability to use nutrients.  What increases the risk? Risk factors include:  Pregnancy and lactation. Women who are pregnant may become malnourished if they do not increase their nutrient intake. They are also susceptible to folic acid deficiency.  Increasing age. The body's ability to absorb nutrients decreases with age. This can contribute to iron, calcium, and vitamin D deficiencies.  Alcohol or drug dependency. Addiction often leads to a lifestyle in which proper nourishment is ignored. Dependency can also hurt the metabolism and the body's ability to absorb nutrients. Alcoholism is a major cause of thiamine deficiency and can lead to deficiencies of magnesium, zinc, and other vitamins.  Eating disorders, such as anorexia nervosa. People with these disorders may eat too little or too much.  Chewing or swallowing problems. People with these  disorders may not eat enough.  Certain diseases, including: ? Long-lasting (chronic) diseases. Chronic diseases tend to affect the absorption of calcium, iron, and vitamins B12, A, D, E, and K. ? Liver disease. Liver disease affects the storage of vitamins A and B12. It also interferes with the metabolism of protein and energy sources. ? Kidney disease. Kidney disease may cause deficiencies of protein, iron, and vitamin D. ? Cancer or AIDS. These diseases can cause a loss of appetite. ? Cystic fibrosis. This disease can make it difficult for the body to absorb nutrients.  Certain diets, including. ? The vegetarian diet. Vegetarians are at risk for iron deficiency. ? The vegan diet. Vegans are susceptible to vitamin B12, calcium, iron, vitamin D, and zinc deficiencies. ? The fruitarian diet. This diet can be deficient in protein, sodium, and many micronutrients. ? Many commercial "fad" diets, including those that claim to enhance well-being and reduce weight. ? Very low calorie diets.  Low income. People with a low income may have trouble paying for nutritious foods.  What are the signs or symptoms? Signs and symptoms depend on the kind of malnutrition you have. Common symptoms include:  Fatigue.  Weakness.  Dizziness.  Fainting  Weight loss.  Poor immune response.  Lack of menstruation.  Hair loss.  Poor memory.  How is this diagnosed? Malnutrition may be diagnosed by:  A medical history.  A dietary history.  A physical exam. This may include a measurement of your body mass index (BMI).  Blood tests.  How is this treated? Treatments vary depending on the cause of the malnutrition. Common treatments include:  Dietary changes.  Dietary supplements, such as vitamins and minerals.  Treatment of any underlying conditions.  Follow these instructions at home:  Eat a balanced diet.  Take dietary supplements as directed by your health care provider.  Exercise  regularly. Exercising can improve appetite.  Keep all follow-up visits as directed by your health care provider. This is important. How is this prevented? Eating a well-balanced diet helps to prevent most forms of malnutrition. Contact a health care provider if:  You have increased weakness or fatigue.  You faint.  You stop menstruating.  You have rapid hair loss.  You have unexpected weight loss. This information is not intended to replace advice given to you by your health care provider. Make sure you discuss any questions you have with your health care provider. Document Released: 01/13/2005 Document Revised: 08/05/2015 Document Reviewed: 10/24/2013 Elsevier Interactive Patient Education  2018 [place] is a complex disease  of the brain. It causes an uncontrollable (compulsive) need for a substance. You can be addicted to substances including alcohol, tobacco, illegal drugs, or prescription medicines such as painkillers. Addiction can change the way that your brain works. It affects memory, behavior, and how you make decisions. Without treatment, addiction can get worse. However, with treatment and lifestyle changes, you can recover from addiction. What types of treatment are available? The treatment program that is right for you will depend on many factors, including the type of addiction that you have. Treatment programs can be outpatient or inpatient. In an outpatient program, you live at home and go to work or school, but you also go to a clinic for treatment. With an inpatient program, you live and sleep at the program facility during treatment. Other treatment options include:  Medicine. ? Some addictions may be treated with prescription medicines. ? You may also need medicine to treat other mental health conditions such as anxiety or depression.  Counseling and behavior therapy. Therapy can help you learn new ways to respond to  situations that are stressful or that tempt you to use the addictive substance.  Support groups. These include therapy groups and 12-step programs. These can help individuals and families during treatment and recovery. Examples of 12-step programs are Alcoholics Anonymous (AA) and Narcotics Anonymous (NA).  How to manage lifestyle changes Managing stress Too much stress can lead to returning to the addiction (having a relapse). You need to find effective ways to manage your stress. Some techniques to cope with stress include:  Meditation, yoga, or deep breathing.  Exercise. Create an exercise routine that you enjoy and that allows you to work off some energy.  Creating or listening to music.  Muscle relaxation exercises.  Medicines Some medicines may make you feel calmer and help you have fewer cravings. If your health care provider prescribes medicines, make sure you:  Avoid using alcohol and other substances that may prevent your medicines from working properly (may interact).  Talk with your pharmacist or health care provider about all medicines that you take, the possible problems (side effects) that they can cause, and which medicines are safe to take together.  Make it your goal to take part in all treatment decisions (shared decision-making). Ask about possible side effects of medicines that your health care provider recommends, and tell him or her how you feel about having those side effects. It is best if shared decision-making with your health care provider is part of your total treatment plan.  Relationships Supportive relationships are very important in your recovery. When you are recovering from drug addiction, it will be important to avoid being around people who use drugs. For many people, this means developing new and different relationships. Some ways to do this include:  Developing trusting relationships with the people you meet in treatment or in AA or NA. These people  share your desire to stop using substances (get sober) and to stay sober.  Getting a sponsor as a primary support person if you are attending a 12-step program.  Building relationships with people you meet through activities such as hobbies, volunteering, or exercising.  How to recognize changes in your condition When recovering from an addiction, it is very common for a person to relapse and start using the substance again. Contact your sponsor, therapist, or health care provider to seek additional help if you experience the following:  Anxiety.  Excessive anger.  Isolating yourself from others.  Trouble sleeping.  Feeling  depressed.  Loss of appetite.  Fantasies about using the substance.  Where to find support Talking to others  You may be advised to see a family therapist along with members of your family. Family therapy can help you and your family understand what led you to addiction. Talk with your family about this approach.  Let your family members or friends know that they can help you through treatment. Support from loved ones will be important to help you maintain positive changes. Financial resources Be sure to check with your insurance carrier to find out what treatment options are covered by your plan. You may also be able to find financial assistance through not-for-profit organizations or with local government-based resources. If you are taking medicines, you may be able to get the generic form, which may be less expensive than brand-name medicine. Some makers of prescription medicines also offer help to patients who cannot afford the medicines that they need. Follow these instructions at home:  Take over-the-counter and prescription medicines only as told by your health care provider.  Stay in treatment until you complete the program. Take an active role in your treatment and your physical and emotional self-care. Develop a follow-up plan.  Keep all follow-up  visits as told by your health care provider and counselor. This is important.  Eat a healthy diet, exercise regularly, and get enough sleep. Questions to ask your health care provider  If you are taking medicines: ? How long do I need to take medicine? ? Are there any long-term side effects of my medicine? ? Are there other options instead of taking medicine?  Would I benefit from therapy?  How often should I follow up with a health care provider? Contact a health care provider if:  You feel like you might relapse.  You have stopped taking your medicine. Get help right away if:  You have serious thoughts about hurting yourself or others. If you ever feel like you may hurt yourself or others, or have thoughts about taking your own life, get help right away. You can go to your nearest emergency department or call:  Your local emergency services (911 in the [place]  A suicide crisis helpline, such as the [facility] at (403)155-9890. This is open 24 hours a day.  Summary  With treatment and lifestyle changes, it is possible to recover from an addiction to substances like alcohol, tobacco, illegal drugs, or prescription medicines such as painkillers.  Find effective ways to manage your stress to avoid a relapse. Some techniques to cope with stress include exercise, meditation, yoga, and deep breathing.  Let loved ones know that their support is important to help you recover.  If you have any signs that you may relapse, contact your 12-step sponsor, therapist, or health care provider to seek additional help. This information is not intended to replace advice given to you by your health care provider. Make sure you discuss any questions you have with your health care provider. Document Released: 07/14/2016 Document Revised: 07/14/2016 Document Reviewed: 07/14/2016 Elsevier Interactive Patient Education  2018 [place].   Knee Pain, Adult Knee pain in  adults is common. It can be caused by many things, including:  Arthritis.  A fluid-filled sac (cyst) or growth in your knee.  An infection in your knee.  An injury that will not heal.  Damage, swelling, or irritation of the tissues that support your knee.  Knee pain is usually not a sign of a serious problem. The  pain may go away on its own with time and rest. If it does not, a health care provider may order tests to find the cause of the pain. These may include:  Imaging tests, such as an X-ray, MRI, or ultrasound.  Joint aspiration. In this test, fluid is removed from the knee.  Arthroscopy. In this test, a lighted tube is inserted into knee and an image is projected onto a TV screen.  A biopsy. In this test, a sample of tissue is removed from the body and studied under a microscope.  Follow these instructions at home: Pay attention to any changes in your symptoms. Take these actions to relieve your pain. Activity  Rest your knee.  Do not do things that cause pain or make pain worse.  Avoid high-impact activities or exercises, such as running, jumping rope, or doing jumping jacks. General instructions  Take over-the-counter and prescription medicines only as told by your health care provider.  Raise (elevate) your knee above the level of your heart when you are sitting or lying down.  Sleep with a pillow under your knee.  If directed, apply ice to the knee: ? Put ice in a plastic bag. ? Place a towel between your skin and the bag. ? Leave the ice on for 20 minutes, 2-3 times a day.  Ask your health care provider if you should wear an elastic knee support.  Lose weight if you are overweight. Extra weight can put pressure on your knee.  Do not use any products that contain nicotine or tobacco, such as cigarettes and e-cigarettes. Smoking may slow the healing of any bone and joint problems that you may have. If you need help quitting, ask your health care  provider. Contact a health care provider if:  Your knee pain continues, changes, or gets worse.  You have a fever along with knee pain.  Your knee buckles or locks up.  Your knee swells, and the swelling becomes worse. Get help right away if:  Your knee feels warm to the touch.  You cannot move your knee.  You have severe pain in your knee.  You have chest pain.  You have trouble breathing. Summary  Knee pain in adults is common. It can be caused by many things, including, arthritis, infection, cysts, or injury.  Knee pain is usually not a sign of a serious problem, but if it does not go away, a health care provider may perform tests to know the cause of the pain.  Pay attention to any changes in your symptoms. Relieve your pain with rest, medicines, light activity, and use of ice.  Get help if your pain continues or becomes very severe, or if your knee buckles or locks up, or if you have chest pain or trouble breathing. This information is not intended to replace advice given to you by your health care provider. Make sure you discuss any questions you have with your health care provider. Document Released: 12/25/2006 Document Revised: 02/18/2016 Document Reviewed: 02/18/2016 Elsevier Interactive Patient Education  2018 [employer].   What You Need To Know About Alcohol Abuse and Dependence, Adult Alcohol is a widely available drug. People who use alcohol will consume it in varying amounts. People who drink alcohol in excess, and have behavior problems during and after drinking alcohol, may have what is called an alcohol use disorder. Alcohol abuse and alcohol dependence are the two main types of alcohol use disorders:  Alcohol abuse is when you use alcohol too  much or too often. You may use alcohol to make yourself feel happy or to reduce stress, but you may have a hard time setting a limit on the amount you drink.  Alcohol dependence is when you use alcohol excessively for  a period of time, and your body and brain chemistry changes as a result. This can make it hard to stop drinking because you may start to feel sick or feel different when you do not use alcohol.  How can alcohol abuse and dependence affect me? Alcohol abuse and dependence can have a negative effect on your life. Excessive use of alcohol may lead to an addiction. You may feel like you need alcohol to function normally. You may drink alcohol before work in the morning, during the day, or as soon as you get home from work in the evening. These actions can result in:  Poor performance at work.  Losing your job.  Financial problems.  Car crashes or criminal charges from driving after drinking alcohol.  Problems in your relationships with friends and family.  Losing the trust and respect of co-workers, friends, and family.  Drinking heavily over a long period of time can permanently damage your body and brain, and can cause lifelong health issues, such as:  Liver disease.  Heart problems, high blood pressure, or stroke.  Damage to your pancreas.  Certain cancers.  Decreased ability to fight infections.  Numbness or tingling in hands or feet (neuropathy).  Brain damage.  Depression.  Early (premature) death.  When your body craves alcohol, it is easy to drink more than your body can handle. As a result, you may overdose. Alcohol overdose is a serious situation that requires hospitalization. It may lead to permanent injuries or death. What are the benefits of avoiding alcohol use? Limiting or avoiding alcohol can help you:  Avoid risks to your body, brain, and relationships.  Avoid the risk of abusing or becoming dependent on alcohol.  Keep your mind and body healthy. As a result, you may be more likely to accomplish your life goals.  Avoid permanent injury, organ damage, or death due to alcohol use.  What steps can I take to stop drinking?  The best way to avoid alcohol  abuse, dependence, and addiction is not to drink at all, or to drink measured amounts. Measured drinking means no more than 1 drink a day for nonpregnant women and 2 drinks a day for men. One drink equals 12 oz of beer, 5 oz of wine, or 1 oz of hard liquor.  Stop drinking if you have been drinking too much. This can be very hard to do if you are used to abusing alcohol. If you find it hard to stop drinking, talk about your experience with someone you trust. This person may be able to help you change your drinking behavior.  Instead of drinking alcohol, do something else, like a hobby or exercise.  Find healthy ways to cope with stress, such as exercise, meditation, or spending time with people you care about.  In social gatherings and places where there may be alcohol, make intentional choices to drink non-alcohol beverages.  If your family, co-workers, or friends drink, talk to them about supporting you in your efforts to stop drinking. Ask them not to drink around you. Spend more time with people who do not drink alcohol.  If you think that you have an alcohol dependency problem: ? Tell friends or family about your concerns. ? Talk with your health  care provider or another health professional about where to get help. ? Work with a Transport planner and a Regulatory affairs officer. ? Consider joining a support group for people who struggle with alcohol abuse, dependence, and addiction. Where to find support: You can get support for preventing alcohol abuse, dependence, and addiction from:  Your health care provider.  Alcoholics Anonymous (AA): NicTax.com.pt  SMART Recovery: www.smartrecovery.org  Local treatment centers or chemical dependency counselors.  Where to find more information: Learn more about alcohol abuse and dependence from:  Centers for Disease Control and Prevention: GoalForum.com.au  [employer] on Alcohol Abuse and Alcoholism:  https://clark.org/  Local AA groups in your community.  Contact a health care provider if:  You drink more or for longer than you intended, on more than one occasion.  You tried to stop drinking or to cut back on how much you drink, but you were not able to.  You often drink to the point of vomiting or passing out.  You want to drink so badly that you cannot think about anything else.  Drinking has created problems in your life, but you continue to drink.  You keep drinking even though you feel anxious, depressed, or have experienced memory loss.  You have stopped doing the things you used to enjoy in order to drink.  You have to drink more than you used to in order to get the effect you want.  You experience anxiety, sweating, nausea, shakiness, and trouble sleeping when you try to stop drinking.  You have thoughts about hurting yourself or others. If you ever feel like you may hurt yourself or others, or have thoughts about taking your own life, get help right away. You can go to your nearest emergency department or call:  Your local emergency services (911 in the [place]  A suicide crisis helpline, such as the [facility] at 807-613-8328. This is open 24 hours a day.  Summary  Alcohol is a widely available drug. Misusing, abusing, and becoming dependent on alcohol can cause many problems.  It is important to measure and limit the amount of alcohol you consume. It is recommended to limit alcohol use to 1 drink a day for nonpregnant women and 2 drinks a day for men.  The risks associated with drinking too much will have a direct negative impact on your work, relationships, and health.  If you realize that you are having some challenges keeping your drinking under control, find some ways to change your behavior. Hobbies, self calming activities, exercise, or support groups can help.  If you feel you need  help with changing your drinking habits, talk with your health care provider, a good friend, or a therapist, or go to an [facility] group. This information is not intended to replace advice given to you by your health care provider. Make sure you discuss any questions you have with your health care provider. Document Released: 02/22/2016 Document Revised: 02/22/2016 Document Reviewed: 02/22/2016 Elsevier Interactive Patient Education  [employer].

## 2017-04-11 NOTE — Discharge Summary (Signed)
Physician Discharge Summary  Tristan Cook HBZ:[id number] DOB: 14-Feb-1961 DOA: 04/10/2017  PCP: Soyla Dryer, PA-C  Admit date: 04/10/2017 Discharge date: 04/11/2017  Admitted From: Home  Disposition: Home with Home Health Recommendations for Outpatient Follow-up:  1. Follow up with PCP in 1 weeks 2. Please obtain BMP/CBC in one week 3. Please follow up on the following pending results:  Home Health: PT  Discharge Condition: STABLE   CODE STATUS: FULL    Brief Hospitalization Summary: Please see all hospital notes, images, labs for full details of the hospitalization.  HPI: Tristan Cook is a 57 y.o. male past medical history significant for bilateral lumbar radiculopathy, chronic back pain and uses a wheelchair presents the emergency room with chief complaint of inability to walk.  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  He denies any fall or trauma.  Just has had trouble walking over the last 2 days.  No recent illness.  No medication changes except for stopping supplemental vitamins.  The patient was seen and evaluated in the hospital.  He was seen by physical therapy who evaluated him and thought that he was weak enough to qualify for SNF placement.  Unfortunately the patient does not have medical insurance and a payer source for SNF placement.  The case managers were able to arrange for him to receive home health care with physical therapy and for a rolling walker prior to discharge.  He did have an abdominal ultrasound which revealed fatty liver infiltration.  He was counseled about alcohol cessation and avoiding recreational drugs.  He was instructed to follow-up with his primary care provider.  [facility]  He also has been seeing a neurologist in [place] which I have also encouraged him to follow-up with.  The patient verbalized understanding.  He reports that he does have a roommate at home that helps to care for  him.  He is deemed stable for discharge home.  Discharge Diagnoses:  Active Problems:   Weakness of both legs   Weakness  Discharge Instructions: Discharge Instructions    Diet - low sodium heart healthy   Complete by:  As directed    Increase activity slowly   Complete by:  As directed      Allergies as of 04/11/2017      Reactions   Benadryl [diphenhydramine Hcl (sleep)] Other (See Comments)   Palpitations      Medication List    STOP taking these medications   fludrocortisone 0.1 MG tablet Commonly known as:  FLORINEF   testosterone cypionate 200 MG/ML injection Commonly known as:  DEPOTESTOSTERONE CYPIONATE   traMADol 50 MG tablet Commonly known as:  ULTRAM     TAKE these medications   amoxicillin 500 MG capsule Commonly known as:  AMOXIL Take 1 capsule (500 mg total) by mouth 3 (three) times daily. What changed:  when to take this   aspirin EC 81 MG tablet Take 162 mg by mouth daily. What changed:  Another medication with the same name was removed. Continue taking this medication, and follow the directions you see here.   cholecalciferol 1000 units tablet Commonly known as:  VITAMIN D Take 1,000 Units by mouth daily.   FLUoxetine 20 MG tablet Commonly known as:  PROZAC Take 1 tablet (20 mg total) by mouth daily.   folic acid 101 MCG tablet Commonly known as:  FOLVITE Take 400 mcg by mouth daily.   meclizine 25 MG tablet  Commonly known as:  ANTIVERT Take 1 tablet (25 mg total) by mouth 3 (three) times daily as needed for dizziness.   metoprolol tartrate 25 MG tablet Commonly known as:  LOPRESSOR Take 1 tablet (25 mg total) by mouth 2 (two) times daily.   multivitamin with minerals Tabs tablet Take 1 tablet by mouth daily. Start taking on:  04/12/2017   pyridoxine 100 MG tablet Commonly known as:  B-6 Take 100 mg by mouth daily.   thiamine 100 MG tablet Take 1 tablet (100 mg total) by mouth daily. Start taking on:  04/12/2017   Turmeric 500  MG Tabs Take 1 tablet by mouth daily.   vitamin B-12 1000 MCG tablet Commonly known as:  CYANOCOBALAMIN Take 1,000 mcg by mouth daily.   vitamin E 400 UNIT capsule Take 400 Units by mouth daily.            Durable Medical Equipment  (From admission, onward)        Start     Ordered   04/11/17 1415  For home use only DME Walker rolling  Once    Question:  Patient needs a walker to treat with the following condition  Answer:  General weakness   04/11/17 1414     Follow-up Information    Soyla Dryer, PA-C. Schedule an appointment as soon as possible for a visit in 1 week(s).   Specialty:  Physician Assistant Why:  Hospital Follow Up  Contact information: [address] [place] [address] [phone]        Carole Civil, MD. Schedule an appointment as soon as possible for a visit in 1 week(s).   Specialties:  Orthopedic Surgery, Radiology Why:  Hospital Follow Up Knee Pain Contact information: [address] [place] [address] [phone]        Melvenia Beam, MD. Schedule an appointment as soon as possible for a visit in 2 week(s).   Specialty:  Neurology Why:  Hospital Follow Up Contact information: [address] [place] [address] [phone]          Allergies  Allergen Reactions  . Benadryl [Diphenhydramine Hcl (Sleep)] Other (See Comments)    Palpitations   Allergies as of 04/11/2017      Reactions   Benadryl [diphenhydramine Hcl (sleep)] Other (See Comments)   Palpitations      Medication List    STOP taking these medications   fludrocortisone 0.1 MG tablet Commonly known as:  FLORINEF   testosterone cypionate 200 MG/ML injection Commonly known as:  DEPOTESTOSTERONE CYPIONATE   traMADol 50 MG tablet Commonly known as:  ULTRAM     TAKE these medications   amoxicillin 500 MG capsule Commonly known as:  AMOXIL Take 1 capsule (500 mg total) by mouth 3 (three) times daily. What changed:  when  to take this   aspirin EC 81 MG tablet Take 162 mg by mouth daily. What changed:  Another medication with the same name was removed. Continue taking this medication, and follow the directions you see here.   cholecalciferol 1000 units tablet Commonly known as:  VITAMIN D Take 1,000 Units by mouth daily.   FLUoxetine 20 MG tablet Commonly known as:  PROZAC Take 1 tablet (20 mg total) by mouth daily.   folic acid 962 MCG tablet Commonly known as:  FOLVITE Take 400 mcg by mouth daily.   meclizine 25 MG tablet Commonly known as:  ANTIVERT Take 1 tablet (25 mg total) by mouth  3 (three) times daily as needed for dizziness.   metoprolol tartrate 25 MG tablet Commonly known as:  LOPRESSOR Take 1 tablet (25 mg total) by mouth 2 (two) times daily.   multivitamin with minerals Tabs tablet Take 1 tablet by mouth daily. Start taking on:  04/12/2017   pyridoxine 100 MG tablet Commonly known as:  B-6 Take 100 mg by mouth daily.   thiamine 100 MG tablet Take 1 tablet (100 mg total) by mouth daily. Start taking on:  04/12/2017   Turmeric 500 MG Tabs Take 1 tablet by mouth daily.   vitamin B-12 1000 MCG tablet Commonly known as:  CYANOCOBALAMIN Take 1,000 mcg by mouth daily.   vitamin E 400 UNIT capsule Take 400 Units by mouth daily.            Durable Medical Equipment  (From admission, onward)        Start     Ordered   04/11/17 1415  For home use only DME Walker rolling  Once    Question:  Patient needs a walker to treat with the following condition  Answer:  General weakness   04/11/17 1414      Procedures/Studies: Ct Head Wo Contrast  Result Date: 04/03/2017 CLINICAL DATA:  Recent dizziness EXAM: CT HEAD WITHOUT CONTRAST CT MAXILLOFACIAL WITHOUT CONTRAST TECHNIQUE: Multidetector CT imaging of the head and maxillofacial structures were performed using the standard protocol without intravenous contrast. Multiplanar CT image reconstructions of the maxillofacial  structures were also generated. COMPARISON:  MRI from 09/23/2016 FINDINGS: CT HEAD FINDINGS Brain: No evidence of acute infarction, hemorrhage, hydrocephalus, extra-axial collection or mass lesion/mass effect. Vascular: No hyperdense vessel or unexpected calcification. Skull: Normal. Negative for fracture or focal lesion. Other: None. CT MAXILLOFACIAL FINDINGS Osseous: No acute fracture is noted. Dental caries are noted particularly in the left maxillary and mandibular molars. Some periapical lucency is noted in the left mandible adjacent to the residual molar. Orbits: The orbits and their contents are within normal limits. Sinuses: Paranasal sinuses are well aerated. Very minimal mucosal changes are noted within the left maxillary antrum. Soft tissues: No significant soft tissue abnormality is noted. IMPRESSION: CT of the head: No acute intracranial abnormality noted. CT of the maxillofacial bones: Dental caries and possible left mandibular molar periapical abscess. No other focal abnormality is noted. Electronically Signed   By: Inez Catalina M.D.   On: 04/03/2017 12:00   Ct Maxillofacial Wo Contrast  Result Date: 04/03/2017 CLINICAL DATA:  Recent dizziness EXAM: CT HEAD WITHOUT CONTRAST CT MAXILLOFACIAL WITHOUT CONTRAST TECHNIQUE: Multidetector CT imaging of the head and maxillofacial structures were performed using the standard protocol without intravenous contrast. Multiplanar CT image reconstructions of the maxillofacial structures were also generated. COMPARISON:  MRI from 09/23/2016 FINDINGS: CT HEAD FINDINGS Brain: No evidence of acute infarction, hemorrhage, hydrocephalus, extra-axial collection or mass lesion/mass effect. Vascular: No hyperdense vessel or unexpected calcification. Skull: Normal. Negative for fracture or focal lesion. Other: None. CT MAXILLOFACIAL FINDINGS Osseous: No acute fracture is noted. Dental caries are noted particularly in the left maxillary and mandibular molars. Some  periapical lucency is noted in the left mandible adjacent to the residual molar. Orbits: The orbits and their contents are within normal limits. Sinuses: Paranasal sinuses are well aerated. Very minimal mucosal changes are noted within the left maxillary antrum. Soft tissues: No significant soft tissue abnormality is noted. IMPRESSION: CT of the head: No acute intracranial abnormality noted. CT of the maxillofacial bones: Dental caries and possible left mandibular  molar periapical abscess. No other focal abnormality is noted. Electronically Signed   By: Inez Catalina M.D.   On: 04/03/2017 12:00   US Abdomen Limited Ruq  Result Date: 04/11/2017 CLINICAL DATA:  Elevated LFTs EXAM: ULTRASOUND ABDOMEN LIMITED RIGHT UPPER QUADRANT COMPARISON:  07/25/2007 FINDINGS: Gallbladder: No gallstones or wall thickening visualized. No sonographic Murphy sign noted by sonographer. Common bile duct: Diameter: 3.3 mm. Liver: Diffuse increased echogenicity without focal mass consistent with fatty infiltration. Portal vein is patent on color Doppler imaging with normal direction of blood flow towards the liver. IMPRESSION: Fatty liver. No acute abnormality noted. Electronically Signed   By: Inez Catalina M.D.   On: 04/11/2017 13:45      Subjective: The patient has some knee pain otherwise no other complaints.  His knee pain is chronic.  Discharge Exam: Vitals:   04/11/17 0400 04/11/17 0600  BP: 108/79 112/72  Pulse: 100 96  Resp:    Temp: 98.4 F (36.9 C)   SpO2: 100%    Vitals:   04/11/17 0000 04/11/17 0031 04/11/17 0400 04/11/17 0600  BP: 125/78 125/78 108/79 112/72  Pulse: 93 93 100 96  Resp:  18    Temp: 99.1 F (37.3 C) 99.1 F (37.3 C) 98.4 F (36.9 C)   TempSrc: Oral Oral Oral   SpO2: 100%  100%   Weight:  70.6 kg (155 lb 10.3 oz) 71 kg (156 lb 8.4 oz)   Height:  6\' 2"  (1.88 m)     General: Pt is alert, awake, not in acute distress Cardiovascular: RRR, S1/S2 +, no rubs, no gallops Respiratory:  CTA bilaterally, no wheezing, no rhonchi Abdominal: Soft, NT, ND, bowel sounds + Extremities: no edema, no cyanosis   The results of significant diagnostics from this hospitalization (including imaging, microbiology, ancillary and laboratory) are listed below for reference.     Microbiology: No results found for this or any previous visit (from the past 240 hour(s)).   Labs: BNP (last 3 results) No results for input(s): BNP in the last 8760 hours. Basic Metabolic Panel: Recent Labs  Lab 04/10/17 1307 04/11/17 0500  NA 136 136  K 3.3* 3.4*  CL 95* 97*  CO2 24 26  GLUCOSE 112* 100*  BUN <5* 7  CREATININE 0.94 1.03  CALCIUM 9.3 8.3*   Liver Function Tests: Recent Labs  Lab 04/10/17 1615 04/11/17 0500  AST 286* 206*  ALT 177* 125*  ALKPHOS 115 90  BILITOT 3.0* 2.5*  PROT 7.4 5.7*  ALBUMIN 3.2* 2.5*   No results for input(s): LIPASE, AMYLASE in the last 168 hours. No results for input(s): AMMONIA in the last 168 hours. CBC: Recent Labs  Lab 04/10/17 1307 04/11/17 0500  WBC 8.2 6.8  HGB 13.6 11.3*  HCT 38.6* 32.2*  MCV 99.5 99.7  PLT 314 264   Cardiac Enzymes: No results for input(s): CKTOTAL, CKMB, CKMBINDEX, TROPONINI in the last 168 hours. BNP: Invalid input(s): POCBNP CBG: No results for input(s): GLUCAP in the last 168 hours. D-Dimer No results for input(s): DDIMER in the last 72 hours. Hgb A1c No results for input(s): HGBA1C in the last 72 hours. Lipid Profile No results for input(s): CHOL, HDL, LDLCALC, TRIG, CHOLHDL, LDLDIRECT in the last 72 hours. Thyroid function studies No results for input(s): TSH, T4TOTAL, T3FREE, THYROIDAB in the last 72 hours.  Invalid input(s): FREET3 Anemia work up No results for input(s): VITAMINB12, FOLATE, FERRITIN, TIBC, IRON, RETICCTPCT in the last 72 hours. Urinalysis    Component Value  Date/Time   COLORURINE AMBER (A) 04/10/2017 1200   APPEARANCEUR CLEAR 04/10/2017 1200   LABSPEC 1.011 04/10/2017 1200    PHURINE 5.0 04/10/2017 1200   GLUCOSEU NEGATIVE 04/10/2017 1200   GLUCOSEU NEG mg/dL 08/28/2006 1728   HGBUR NEGATIVE 04/10/2017 1200   HGBUR negative 06/16/2008 1509   BILIRUBINUR NEGATIVE 04/10/2017 1200   BILIRUBINUR negative 03/29/2015 1419   KETONESUR NEGATIVE 04/10/2017 1200   PROTEINUR NEGATIVE 04/10/2017 1200   UROBILINOGEN 0.2 03/29/2015 1419   UROBILINOGEN 0.2 10/20/2010 0432   NITRITE NEGATIVE 04/10/2017 1200   LEUKOCYTESUR NEGATIVE 04/10/2017 1200   Sepsis Labs Invalid input(s): PROCALCITONIN,  WBC,  LACTICIDVEN Microbiology No results found for this or any previous visit (from the past 240 hour(s)).  Time coordinating discharge:   SIGNED:  Irwin Brakeman, MD  [facility] 04/11/2017, 2:15 PM Pager [phone]  If 7PM-7AM, please contact night-coverage [email or web address] Password [email or web address]

## 2017-04-11 NOTE — Clinical Social Work Note (Signed)
Patient states that he does not "drink it like that, I'm good." He stated that he was not interested in alcohol abuse treatment.   LCSW signing off.    Romen Yutzy, Clydene Pugh, LCSW

## 2017-04-11 NOTE — Care Management Note (Signed)
Case Management Note  Patient Details  Name: Tristan Cook MRN: [id number] Date of Birth: 1960/05/26  Subjective/Objective:    Admitted with weakness. Pt lives at home, has roommate who is with him most of the day. Pt usually ambulates with cane but for the past 3 days has had to use WC. Pt goes to the [facility] for PCP care and does not have insurance, is not employed. Pt has disability application pending.  PT has recommended SNF. Pt has no payor source for SNF and someone who can be with/help him at home. Pt will be able to get [facility] PT. CM has verified with [facility], Soyla Dryer that they are willing to sign HH orders. Pt has cane and WC pta. He needs RW.                 Action/Plan: CM has given HH and RW referral to [facility] rep. Info will be pulled from chart and RW will be delivered to pt room prior to DC. Pt made aware HH has 48 hrs to make first visit. FC spoke with pt about medicaid application process. And paying for this hospitalization.  Expected Discharge Date:      04/11/2017            Expected Discharge Plan:  [place]  In-House Referral:  Financial Counselor  Discharge planning Services   case management  Post Acute Care Choice:  Home Health Choice offered to:  NA  DME Arranged:   rolling walker DME Agency:   [facility]  HH Arranged:  PT [place] Agency:  [place]  Status of Service:  Completed, signed off  Sherald Barge, RN 04/11/2017, 2:12 PM

## 2017-04-11 NOTE — Evaluation (Signed)
Physical Therapy Evaluation Patient Details Name: Tristan Cook MRN: [id number] DOB: 03/01/1961 Today's Date: 04/11/2017   History of Present Illness  Tristan Cook is a 57 y.o. male past medical history significant for bilateral lumbar radiculopathy, chronic back pain and uses a wheelchair presents the emergency room with chief complaint of inability to walk.  He denies any fall or trauma.  Just has had trouble walking over the last 2 days.  No recent illness.  No medication changes except for stopping supplemental vitamins.    Clinical Impression  Patient unable to maintain standing balance using RW due to BLE weakness and incoordination, very unsteady/shaky when attempting to take steps with near falls prevent by Probation officer.  Patient demonstrated good return for using armrest of chair to stand pivot and able to take a few steps hanging onto bed rail.  Patient states his roommate can assist him and he can use his wheelchair for mobility.  Patient will benefit from continued physical therapy in hospital and recommended venue below to increase strength, balance, endurance for safe ADLs and gait.    Follow Up Recommendations SNF;Supervision/Assistance - 24 hour    Equipment Recommendations  Rolling walker with 5" wheels    Recommendations for Other Services       Precautions / Restrictions Precautions Precautions: Fall Restrictions Weight Bearing Restrictions: No      Mobility  Bed Mobility Overal bed mobility: Modified Independent                Transfers Overall transfer level: Needs assistance Equipment used: Rolling walker (2 wheeled);1 person hand held assist Transfers: Sit to/from [facility] Sit to Stand: Mod assist;Max assist Stand pivot transfers: Min assist       General transfer comment: Mod/max assist for sit to stands using RW, unable to stand using SPC, able to stand pivot using armrest of chair  Ambulation/Gait Ambulation/Gait  assistance: Max assist Ambulation Distance (Feet): 2 Feet Assistive device: Rolling walker (2 wheeled)     Gait velocity interpretation: Below normal speed for age/gender General Gait Details: patient limited to a couple of shuffling steps before having to sit due to BLE weakness/incoordination  Stairs            Wheelchair Mobility    Modified Rankin (Stroke Patients Only)       Balance Overall balance assessment: Needs assistance Sitting-balance support: Feet supported;No upper extremity supported Sitting balance-Leahy Scale: Good     Standing balance support: Single extremity supported;Bilateral upper extremity supported;During functional activity Standing balance-Leahy Scale: Poor Standing balance comment: unable to stand using SPC, poor with RW                             Pertinent Vitals/Pain Pain Assessment: Faces Faces Pain Scale: Hurts a little bit Pain Location: bilateral knees Pain Descriptors / Indicators: Aching;Discomfort Pain Intervention(s): Limited activity within patient's tolerance;Monitored during session    Home Living Family/patient expects to be discharged to:: Private residence Living Arrangements: Non-relatives/Friends(lives with roommate) Available Help at Discharge: Friend(s) Type of Home: House Home Access: Level entry     Home Layout: One level Home Equipment: Cane - single point;Wheelchair - manual;Shower seat;Bedside commode      Prior Function Level of Independence: Independent with assistive device(s)         Comments: ambulates household distances with SPC     Hand Dominance        Extremity/Trunk Assessment   Upper  Extremity Assessment Upper Extremity Assessment: Generalized weakness    Lower Extremity Assessment Lower Extremity Assessment: Generalized weakness    Cervical / Trunk Assessment Cervical / Trunk Assessment: Normal  Communication   Communication: No difficulties  Cognition    Behavior During Therapy: WFL for tasks assessed/performed Overall Cognitive Status: Within Functional Limits for tasks assessed                                        General Comments      Exercises     Assessment/Plan    PT Assessment Patient needs continued PT services  PT Problem List Decreased strength;Decreased activity tolerance;Decreased balance;Decreased mobility       PT Treatment Interventions Gait training;Functional mobility training;Therapeutic activities;Therapeutic exercise;Patient/family education    PT Goals (Current goals can be found in the Care Plan section)  Acute Rehab PT Goals Patient Stated Goal: return home with roommate to assist PT Goal Formulation: With patient Time For Goal Achievement: 04/18/17 Potential to Achieve Goals: Good    Frequency Min 3X/week   Barriers to discharge        Co-evaluation               AM-PAC PT "6 Clicks" Daily Activity  Outcome Measure Difficulty turning over in bed (including adjusting bedclothes, sheets and blankets)?: None Difficulty moving from lying on back to sitting on the side of the bed? : None Difficulty sitting down on and standing up from a chair with arms (e.g., wheelchair, bedside commode, etc,.)?: A Lot Help needed moving to and from a bed to chair (including a wheelchair)?: A Lot Help needed walking in hospital room?: Total Help needed climbing 3-5 steps with a railing? : Total 6 Click Score: 14    End of Session Equipment Utilized During Treatment: Gait belt Activity Tolerance: Patient limited by fatigue Patient left: in chair;with call bell/phone within reach Nurse Communication: Mobility status;Other (comment)(RN notified that patient left up in chair) PT Visit Diagnosis: Unsteadiness on feet (R26.81);Other abnormalities of gait and mobility (R26.89);Muscle weakness (generalized) (M62.81)    Time: 6546-5035 PT Time Calculation (min) (ACUTE ONLY): 25  min   Charges:   PT Evaluation $PT Eval Moderate Complexity: 1 Mod PT Treatments $Therapeutic Activity: 23-37 mins   PT G Codes:        1:49 PM, 18-Apr-2017 Lonell Grandchild, MPT Physical Therapist with [facility] 336 [phone] office [phone] mobile phone

## 2017-04-12 NOTE — Telephone Encounter (Signed)
Pt called he did go to ED. He is requesting a call back from RN. He did not go into detail.

## 2017-04-13 NOTE — Telephone Encounter (Signed)
Spoke with patient. He stated that he did go to the ED but they could not find anything. He has a therapist & a nurse coming to his home next week. He reports pain in his knees and asked for a refill of Tramadol sent to [facility] in [place].

## 2017-04-13 NOTE — Telephone Encounter (Signed)
Per Dr. Jaynee Eagles, will not be repeating refills of Tramadol or any narcotics.  Spoke with patient. He is aware that Dr. Jaynee Eagles will not be repeating refills of Tramadol. Patient was advised to contact his PCP for this. He verbalized understanding & appreciation.

## 2017-04-17 ENCOUNTER — Encounter: Payer: Self-pay | Admitting: Physician Assistant

## 2017-04-17 ENCOUNTER — Ambulatory Visit: Payer: Self-pay | Admitting: Physician Assistant

## 2017-04-17 ENCOUNTER — Telehealth: Payer: Self-pay | Admitting: Neurology

## 2017-04-17 VITALS — BP 124/80 | HR 87 | Temp 97.9°F | Ht 74.0 in

## 2017-04-17 DIAGNOSIS — G63 Polyneuropathy in diseases classified elsewhere: Secondary | ICD-10-CM

## 2017-04-17 DIAGNOSIS — G621 Alcoholic polyneuropathy: Secondary | ICD-10-CM

## 2017-04-17 DIAGNOSIS — K0889 Other specified disorders of teeth and supporting structures: Secondary | ICD-10-CM

## 2017-04-17 DIAGNOSIS — F32A Depression, unspecified: Secondary | ICD-10-CM

## 2017-04-17 DIAGNOSIS — R748 Abnormal levels of other serum enzymes: Secondary | ICD-10-CM

## 2017-04-17 DIAGNOSIS — E538 Deficiency of other specified B group vitamins: Secondary | ICD-10-CM

## 2017-04-17 DIAGNOSIS — Z125 Encounter for screening for malignant neoplasm of prostate: Secondary | ICD-10-CM

## 2017-04-17 DIAGNOSIS — E639 Nutritional deficiency, unspecified: Secondary | ICD-10-CM

## 2017-04-17 DIAGNOSIS — F329 Major depressive disorder, single episode, unspecified: Secondary | ICD-10-CM

## 2017-04-17 DIAGNOSIS — R531 Weakness: Secondary | ICD-10-CM

## 2017-04-17 DIAGNOSIS — I10 Essential (primary) hypertension: Secondary | ICD-10-CM

## 2017-04-17 DIAGNOSIS — G5793 Unspecified mononeuropathy of bilateral lower limbs: Secondary | ICD-10-CM

## 2017-04-17 MED ORDER — AMOXICILLIN 500 MG PO CAPS: 500.0000 mg | ORAL_CAPSULE | Freq: Three times a day (TID) | ORAL | 0 refills | Status: DC

## 2017-04-17 MED ORDER — NAPROXEN 500 MG PO TABS: 500.0000 mg | ORAL_TABLET | Freq: Two times a day (BID) | ORAL | 0 refills | Status: DC | PRN

## 2017-04-17 NOTE — Patient Instructions (Signed)
Schedule follow-up with neurology 

## 2017-04-17 NOTE — Telephone Encounter (Signed)
Called and spoke with patient. Discussed that Metoprolol was not prescribed by Dr. Jaynee Eagles and patient just saw his PCP today. He will call his PCP.

## 2017-04-17 NOTE — Telephone Encounter (Signed)
Pt called requesting a call back from RN to discuss his dosages for metoprolol tartrate (LOPRESSOR) 25 MG tablet

## 2017-04-17 NOTE — Progress Notes (Signed)
BP 124/80 (BP Location: Left Arm, Patient Position: Sitting, Cuff Size: Normal)   Pulse 87   Temp 97.9 F (36.6 C) (Other (Comment))   Ht 6\' 2"  (1.88 m)   SpO2 99%   BMI 20.10 kg/m    Subjective:    Patient ID: Tristan Cook, male    DOB: November 03, 1960, 57 y.o.   MRN: [id number]  HPI: Tristan Cook is a 57 y.o. male presenting on 04/17/2017 for Follow-up (f/u from hospital)   HPI   Pt did not get labs after OV 02/22/17 as ordered.  He did not return to [facility] as recommended. He was no-show to nutritionist appointment  Pt says he does not have the [facility] or medicaid  Pt is requesting more amoxil because he says his teeth still hurt.  He says he was given rx in the hospital  Pt requests pain medication  Pt was inpatient recently for weakness and was discharged with orders for physical therapy.  He says he has not yet started physical therapy.     Pt insists that he doesn't drink or use any street drugs.   Relevant past medical, surgical, family and social history reviewed and updated as indicated. Interim medical history since our last visit reviewed. Allergies and medications reviewed and updated.   Current Outpatient Medications:  .  aspirin EC 81 MG tablet, Take 81 mg by mouth daily. , Disp: , Rfl:  .  cholecalciferol (VITAMIN D) 1000 units tablet, Take 2,000 Units by mouth daily. , Disp: , Rfl:  .  docusate sodium (COLACE) 100 MG capsule, Take 100 mg by mouth 3 (three) times daily as needed for mild constipation., Disp: , Rfl:  .  FLUoxetine (PROZAC) 20 MG tablet, Take 1 tablet (20 mg total) by mouth daily., Disp: 90 tablet, Rfl: 1 .  folic acid (FOLVITE) 829 MCG tablet, Take 400 mcg by mouth daily., Disp: , Rfl:  .  metoprolol tartrate (LOPRESSOR) 25 MG tablet, Take 1 tablet (25 mg total) by mouth 2 (two) times daily. (Patient taking differently: Take 25 mg by mouth once. ), Disp: 60 tablet, Rfl: 0 .  Multiple Vitamin (MULTIVITAMIN WITH MINERALS) TABS  tablet, Take 1 tablet by mouth daily., Disp: , Rfl:  .  pyridoxine (B-6) 100 MG tablet, Take 100 mg by mouth daily., Disp: , Rfl:  .  Turmeric 500 MG TABS, Take 1 tablet by mouth daily., Disp: , Rfl:  .  vitamin B-12 (CYANOCOBALAMIN) 1000 MCG tablet, Take 1,000 mcg by mouth daily., Disp: , Rfl:  .  vitamin E 400 UNIT capsule, Take 400 Units by mouth daily., Disp: , Rfl:  .  meclizine (ANTIVERT) 25 MG tablet, Take 1 tablet (25 mg total) by mouth 3 (three) times daily as needed for dizziness. (Patient not taking: Reported on 04/17/2017), Disp: 21 tablet, Rfl: 0 .  thiamine 100 MG tablet, Take 1 tablet (100 mg total) by mouth daily. (Patient not taking: Reported on 04/17/2017), Disp: , Rfl:    Review of Systems  Respiratory: Negative for shortness of breath.   Cardiovascular: Negative for chest pain.    Per HPI unless specifically indicated above     Objective:    BP 124/80 (BP Location: Left Arm, Patient Position: Sitting, Cuff Size: Normal)   Pulse 87   Temp 97.9 F (36.6 C) (Other (Comment))   Ht 6\' 2"  (1.88 m)   SpO2 99%   BMI 20.10 kg/m   Wt Readings from Last 3 Encounters:  04/11/17 156 lb 8.4 oz (71 kg)  04/03/17 165 lb (74.8 kg)  02/22/17 167 lb 8 oz (76 kg)    Physical Exam  Constitutional: He is oriented to person, place, and time. He is cooperative.  Pt appears frail.  Pt is examined in wheelchair today because he says he is too weak to get on exam table.   HENT:  Head: Normocephalic and atraumatic.  Mouth/Throat: Uvula is midline. No trismus in the jaw. Abnormal dentition. Dental caries present. No dental abscesses.  Neck: Neck supple.  Cardiovascular: Normal rate, regular rhythm and normal heart sounds.  Pulmonary/Chest: Effort normal and breath sounds normal. No respiratory distress. He has no wheezes. He has no rales.  Abdominal: Soft. Bowel sounds are normal. There is no tenderness.  Musculoskeletal: He exhibits no edema.  Neurological: He is alert and oriented to  person, place, and time.  Skin: Skin is warm and dry.  Psychiatric: He has a normal mood and affect. His behavior is normal.  Nursing note and vitals reviewed.        Assessment & Plan:   Encounter Diagnoses  Name Primary?  . Essential hypertension Yes  . Vitamin B12 deficiency neuropathy ([place])   . Alcoholic peripheral neuropathy ([place])   . Depression, unspecified depression type   . Screening for prostate cancer   . Neuropathy of both feet   . Nutritional deficiency   . Elevated liver enzymes   . Dentalgia   . Weakness      -Pt is already set up with home health/PT -discussed with pt that he needs to get [facility] [employer] [facility] application turned in  -reminded Pt that he needs to get his labs done.  They were reordered and he is encouraged to get them drawn today -discussed with pt that he needs to follow up with neurology.  He has not been since November.  He has the contact information to set up his follow-up -will rx amoxil for toothache and pt already on dental list -discussed pain medication and pt states that naproxen would be agreeable.  rx sent to his pharmacy -pt will follow up next month as scheduled.  He will RTO sooner if needed.

## 2017-04-22 ENCOUNTER — Emergency Department (HOSPITAL_COMMUNITY): Payer: Self-pay

## 2017-04-22 ENCOUNTER — Emergency Department (HOSPITAL_COMMUNITY)
Admission: EM | Admit: 2017-04-22 | Discharge: 2017-04-22 | Disposition: A | Discharge status: Home / Self Care | Payer: Self-pay | Attending: Emergency Medicine | Admitting: Emergency Medicine

## 2017-04-22 ENCOUNTER — Encounter (HOSPITAL_COMMUNITY): Payer: Self-pay

## 2017-04-22 DIAGNOSIS — R251 Tremor, unspecified: Secondary | ICD-10-CM | POA: Insufficient documentation

## 2017-04-22 DIAGNOSIS — Z7982 Long term (current) use of aspirin: Secondary | ICD-10-CM | POA: Insufficient documentation

## 2017-04-22 DIAGNOSIS — Z79899 Other long term (current) drug therapy: Secondary | ICD-10-CM | POA: Insufficient documentation

## 2017-04-22 DIAGNOSIS — Z87891 Personal history of nicotine dependence: Secondary | ICD-10-CM | POA: Insufficient documentation

## 2017-04-22 DIAGNOSIS — I10 Essential (primary) hypertension: Secondary | ICD-10-CM | POA: Insufficient documentation

## 2017-04-22 DIAGNOSIS — F1010 Alcohol abuse, uncomplicated: Secondary | ICD-10-CM | POA: Insufficient documentation

## 2017-04-22 HISTORY — DX: Cerebral infarction, unspecified: I63.9

## 2017-04-22 LAB — URINALYSIS, ROUTINE W REFLEX MICROSCOPIC
Bacteria, UA: NONE SEEN
Bilirubin Urine: NEGATIVE
Glucose, UA: NEGATIVE mg/dL
Ketones, ur: NEGATIVE mg/dL
Leukocytes, UA: NEGATIVE
NITRITE: NEGATIVE
PH: 7 (ref 5.0–8.0)
Protein, ur: NEGATIVE mg/dL
SPECIFIC GRAVITY, URINE: 1.011 (ref 1.005–1.030)

## 2017-04-22 LAB — COMPREHENSIVE METABOLIC PANEL
ALT: 121 U/L — AB (ref 17–63)
AST: 266 U/L — AB (ref 15–41)
Albumin: 2.9 g/dL — ABNORMAL LOW (ref 3.5–5.0)
Alkaline Phosphatase: 106 U/L (ref 38–126)
Anion gap: 11 (ref 5–15)
BUN: 7 mg/dL (ref 6–20)
CHLORIDE: 104 mmol/L (ref 101–111)
CO2: 24 mmol/L (ref 22–32)
CREATININE: 0.83 mg/dL (ref 0.61–1.24)
Calcium: 9 mg/dL (ref 8.9–10.3)
GFR calc Af Amer: >60 mL/min (ref >60)
GFR calc non Af Amer: >60 mL/min (ref >60)
Glucose, Bld: 108 mg/dL — ABNORMAL HIGH (ref 65–99)
Potassium: 3.9 mmol/L (ref 3.5–5.1)
SODIUM: 139 mmol/L (ref 135–145)
Total Bilirubin: 1.5 mg/dL — ABNORMAL HIGH (ref 0.3–1.2)
Total Protein: 6.8 g/dL (ref 6.5–8.1)

## 2017-04-22 LAB — CBC WITH DIFFERENTIAL/PLATELET
BASOS ABS: 0 10*3/uL (ref 0.0–0.1)
Basophils Relative: 0 %
EOS ABS: 0.1 10*3/uL (ref 0.0–0.7)
EOS PCT: 1 %
HCT: 33.9 % — ABNORMAL LOW (ref 39.0–52.0)
HEMOGLOBIN: 11.9 g/dL — AB (ref 13.0–17.0)
LYMPHS PCT: 21 %
Lymphs Abs: 1.3 10*3/uL (ref 0.7–4.0)
MCH: 35 pg — ABNORMAL HIGH (ref 26.0–34.0)
MCHC: 35.1 g/dL (ref 30.0–36.0)
MCV: 99.7 fL (ref 78.0–100.0)
Monocytes Absolute: 0.8 10*3/uL (ref 0.1–1.0)
Monocytes Relative: 13 %
NEUTROS PCT: 65 %
Neutro Abs: 4.1 10*3/uL (ref 1.7–7.7)
PLATELETS: 320 10*3/uL (ref 150–400)
RBC: 3.4 MIL/uL — AB (ref 4.22–5.81)
RDW: 15.4 % (ref 11.5–15.5)
WBC: 6.3 10*3/uL (ref 4.0–10.5)

## 2017-04-22 LAB — RAPID URINE DRUG SCREEN, HOSP PERFORMED
Amphetamines: NOT DETECTED
BARBITURATES: NOT DETECTED
Benzodiazepines: NOT DETECTED
Cocaine: NOT DETECTED
Opiates: NOT DETECTED
TETRAHYDROCANNABINOL: NOT DETECTED

## 2017-04-22 LAB — ETHANOL

## 2017-04-22 LAB — LIPASE, BLOOD: Lipase: 21 U/L (ref 11–51)

## 2017-04-22 NOTE — ED Notes (Signed)
Pt transported to CT ?

## 2017-04-22 NOTE — Discharge Instructions (Signed)
Workup here today without any acute findings.  Return for any new or worse symptoms.

## 2017-04-22 NOTE — ED Notes (Signed)
Patient transported to X-ray 

## 2017-04-22 NOTE — ED Provider Notes (Signed)
[facility] EMERGENCY DEPARTMENT Provider Note   CSN: [id number] Arrival date & time: 04/22/17  1508     History   Chief Complaint Chief Complaint  Patient presents with  . Tremors    HPI Tristan Cook is a 57 y.o. male.  Patient presents with a complaint of tremors feeling cold and weakness.  Patient had admission for this on January 29 with extensive work up without any significant findings.  Today's complaints seem to be similar to that admission workup.      Past Medical History:  Diagnosis Date  . Abdominal pain   . Adenomatous colon polyp 2008  . Arthritis   . Back pain   . Bilateral lumbar radiculopathy   . Blurred vision, bilateral   . Chronic back pain   . Constipation   . Depression   . GERD (gastroesophageal reflux disease)   . Gout   . Hemorrhoids   . High cholesterol   . Hypertension   . Neck pain   . Neuropathy of both feet   . Numbness    rectal  . Numbness of legs   . Stroke ([place])   . Substance abuse ([place])    h/o excessive alcohol use; pt says he limits use to one to 2 beers daily he currently    Patient Active Problem List   Diagnosis Date Noted  . Weakness 04/11/2017  . Weakness of both legs 04/10/2017  . Vitamin B deficiency 01/22/2017  . 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 Alcoholic peripheral neuropathy ([place]) 01/17/2017  . Vitamin B12 deficiency neuropathy ([place]) 01/17/2017  . History of colonic polyps 06/14/2016  . Elevated blood pressure reading 06/14/2016  . Hemorrhoids 09/20/2015  . Depression 08/03/2015  . Back pain with radiation 06/15/2015  . Insomnia 06/03/2015  . Hyperlipidemia LDL goal <130 04/25/2015  . 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 Fatigue 03/29/2015  . Allergic rhinitis 02/10/2015  . Esophageal stricture 10/22/2014  . Overweight (BMI 25.0-29.9) 10/06/2013  . GERD (gastroesophageal reflux disease) 04/23/2012  . Hemorrhoids, internal 11/02/2011  . Essential hypertension  07/25/2007  . Neck pain on left side 07/25/2007    Past Surgical History:  Procedure Laterality Date  . COLONOSCOPY  10/09/2006   3 mm pedunculated sigmoid colon polyp removed/8-mm sessile hepatic flexure polyp (tubular villous adenoma) removed/6-mm  descending colon polyp removed/small internal hemorrhoids  . COLONOSCOPY  02/28/2011   LKT:GYBWLS, multiple in the rectum/Internal hemorrhoids, MODERATE-CAUSING RECTAL BLEEDING  . COLONOSCOPY N/A 07/05/2016   Procedure: COLONOSCOPY;  Surgeon: [name], MD;  Location: [facility] END[facility] SUITE;  Service: Endoscopy;  Laterality: N/A;  11:15am  . 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 Mild erosive gastrtitis most likely due to indomethacin   . FINGER SURGERY Right    4th and 5th digit  . FLEXIBLE SIGMOIDOSCOPY  01/02/2012   SLF: 1. the colonic mucosa appeared normal in the sigmoid colon 2. Large internal hemorrhoids: cause for rectal bleeding/pain . s/p banding X 3   . HAND SURGERY    . SAVORY DILATION N/A 07/21/2014   Procedure: SAVORY DILATION;  Surgeon: [name], MD;  Location: [facility] ENDO SUITE;  Service: Endoscopy;  Laterality: N/A;       Home Medications    Prior to Admission medications   Medication Sig Start Date End Date Taking? Authorizing Provider  amoxicillin (AMOXIL) 500 MG capsule Take 1 capsule (500 mg total) by mouth 3 (three) times  daily. 04/17/17  Yes Soyla Dryer, PA-C  aspirin EC 81 MG tablet Take 81 mg by mouth daily.    Yes [provider]  bisacodyl (DULCOLAX) 5 MG EC tablet Take 5 mg by mouth daily as needed for moderate constipation.   Yes [provider]  cholecalciferol (VITAMIN D) 1000 units tablet Take 2,000 Units by mouth daily.    Yes [provider]  docusate sodium (COLACE) 100 MG capsule Take 100 mg by mouth 3 (three) times daily as needed for mild constipation.   Yes [provider]  folic acid (FOLVITE) 834 MCG  tablet Take 400 mcg by mouth daily.   Yes [provider]  meclizine (ANTIVERT) 25 MG tablet Take 1 tablet (25 mg total) by mouth 3 (three) times daily as needed for dizziness. 04/03/17  Yes Lily Kocher, PA-C  metoprolol tartrate (LOPRESSOR) 25 MG tablet Take 1 tablet (25 mg total) by mouth 2 (two) times daily. Patient taking differently: Take 25 mg by mouth once.  02/14/17 05/15/17 Yes Rolland Porter, MD  Multiple Vitamin (MULTIVITAMIN WITH MINERALS) TABS tablet Take 1 tablet by mouth daily. 04/12/17  Yes Johnson, Clanford L, MD  naproxen (NAPROSYN) 500 MG tablet Take 1 tablet (500 mg total) by mouth 2 (two) times daily as needed. 04/17/17  Yes Soyla Dryer, PA-C  OVER THE COUNTER MEDICATION Take 1 tablet by mouth daily. Men's Multivitamin   Yes [provider]  pyridoxine (B-6) 100 MG tablet Take 100 mg by mouth daily.   Yes [provider]  thiamine 100 MG tablet Take 1 tablet (100 mg total) by mouth daily. 04/12/17  Yes Johnson, Clanford L, MD  Turmeric 500 MG TABS Take 1 tablet by mouth daily.   Yes [provider]  vitamin B-12 (CYANOCOBALAMIN) 1000 MCG tablet Take 1,000 mcg by mouth daily.   Yes [provider]  vitamin E 400 UNIT capsule Take 400 Units by mouth daily.   Yes [provider]  FLUoxetine (PROZAC) 20 MG tablet Take 1 tablet (20 mg total) by mouth daily. Patient not taking: Reported on 04/22/2017 09/12/16   Soyla Dryer, PA-C    Family History Family History  Problem Relation Age of Onset  . Hypertension Mother   . Hypertension Father   . Stroke Father   . Hypertension Sister   . Hypertension Brother   . Cancer Brother   . Cancer Brother        throat cancer  . Hypertension Brother   . Diabetes Maternal Grandmother   . Colon cancer Neg Hx     Social History Social History   Tobacco Use  . Smoking status: Former Smoker    Packs/day: 1.50    Years: 33.00    Pack years: 49.50    Types: Cigarettes    Last  attempt to quit: 08/16/2004    Years since quitting: 12.6  . Smokeless tobacco: Current User    Types: Snuff  . Tobacco comment: Dips every once in a while  Substance Use Topics  . Alcohol use: Yes    Alcohol/week: 3.6 oz    Types: 6 Cans of beer per week    Comment: drinks 6 pack of beer on weekends  . Drug use: No     Allergies   Benadryl [diphenhydramine hcl (sleep)]   Review of Systems Review of Systems  Constitutional: Positive for chills. Negative for fever.  HENT: Negative for congestion.   Eyes: Negative for redness.  Respiratory: Negative for shortness of  breath.   Cardiovascular: Negative for chest pain.  Gastrointestinal: Negative for abdominal pain.  Genitourinary: Negative for dysuria.  Musculoskeletal: Negative for back pain.  Skin: Negative for rash.  Neurological: Positive for tremors and weakness. Negative for dizziness and headaches.  Hematological: Does not bruise/bleed easily.  Psychiatric/Behavioral: Negative for confusion.     Physical Exam Updated Vital Signs BP (!) 163/103   Pulse (!) 102   Temp 98 F (36.7 C) (Oral)   Resp 17   SpO2 99%   Physical Exam  Constitutional: He is oriented to person, place, and time. He appears well-developed and well-nourished. No distress.  HENT:  Head: Normocephalic and atraumatic.  Mouth/Throat: Oropharynx is clear and moist.  Eyes: Conjunctivae and EOM are normal. Pupils are equal, round, and reactive to light.  Neck: Neck supple.  Cardiovascular: Normal rate, regular rhythm and normal heart sounds.  Pulmonary/Chest: Effort normal and breath sounds normal. No respiratory distress.  Abdominal: Soft. Bowel sounds are normal. There is no tenderness.  Musculoskeletal: Normal range of motion. He exhibits no edema.  Neurological: He is alert and oriented to person, place, and time. No cranial nerve deficit.  No significant weakness.  Cranial nerves seem to be intact.  Does have bilateral tremor.  Skin: Skin is  warm.  Nursing note and vitals reviewed.    ED Treatments / Results  Labs (all labs ordered are listed, but only abnormal results are displayed) Labs Reviewed  CBC WITH DIFFERENTIAL/PLATELET - Abnormal; Notable for the following components:      Result Value   RBC 3.40 (*)    Hemoglobin 11.9 (*)    HCT 33.9 (*)    MCH 35.0 (*)    All other components within normal limits  COMPREHENSIVE METABOLIC PANEL - Abnormal; Notable for the following components:   Glucose, Bld 108 (*)    Albumin 2.9 (*)    AST 266 (*)    ALT 121 (*)    Total Bilirubin 1.5 (*)    All other components within normal limits  URINALYSIS, ROUTINE W REFLEX MICROSCOPIC - Abnormal; Notable for the following components:   Hgb urine dipstick SMALL (*)    Squamous Epithelial / LPF 0-5 (*)    All other components within normal limits  LIPASE, BLOOD  RAPID URINE DRUG SCREEN, HOSP PERFORMED  ETHANOL    EKG  EKG Interpretation  Date/Time:  Sunday April 22 2017 15:09:01 EST Ventricular Rate:  92 PR Interval:    QRS Duration: 88 QT Interval:  396 QTC Calculation: 490 R Axis:   44 Text Interpretation:  Sinus rhythm Borderline low voltage, extremity leads Borderline prolonged QT interval Baseline wander in lead(s) I Artifact Abnormal ekg Confirmed by Carmin Muskrat [id number]) on 04/22/2017 3:12:43 PM       Radiology Dg Chest 2 View  Result Date: 04/22/2017 CLINICAL DATA:  Pt brought in due to increasing tremors and spastic like movements to all extremities and trunk Pt had stroke approx month ago and tremors started as result of stroke. Pt reports cant shave due to tremors or transfer himself from wheelchair.HISTORY OF HTN, STROKE, SUBSTANCE ABUSE, GOUT EXAM: CHEST  2 VIEW COMPARISON:  02/28/2016 FINDINGS: The heart size and mediastinal contours are within normal limits. Lungs are mildly hyperexpanded but clear. No pleural effusion or pneumothorax. The visualized skeletal structures are unremarkable. IMPRESSION: No  active cardiopulmonary disease. Electronically Signed   By: Lajean Manes M.D.   On: 04/22/2017 16:59   Ct Head Wo Contrast  Result Date:  04/22/2017 CLINICAL DATA:  Tremors with ataxia EXAM: CT HEAD WITHOUT CONTRAST TECHNIQUE: Contiguous axial images were obtained from the base of the skull through the vertex without intravenous contrast. COMPARISON:  April 03, 2017 head CT and brain MRI September 23, 2016 FINDINGS: Brain: There is mild diffuse atrophy. There is no intracranial mass, hemorrhage, extra-axial fluid collection, or midline shift. There is mild patchy small vessel disease in the centra semiovale bilaterally. There is mild small vessel disease in each thalamus. There is no evident acute infarct. Vascular: There is no appreciable hyperdense vessel. There are foci of calcification in each carotid siphon. Skull: Bony calvarium appears intact. Sinuses/Orbits: Visualized paranasal sinuses are clear. Orbits appear symmetric bilaterally. Other: Mastoid air cells are clear. There is stable fatty replacement of the visualized right parotid gland. IMPRESSION: 1. Mild atrophy with mild patchy periventricular small vessel disease. No acute infarct. No mass or hemorrhage. 2.  Foci of arterial vascular calcification. 3. 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 Electronically Signed   By: Lowella Grip III M.D.   On: 04/22/2017 17:47    Procedures Procedures (including critical care time)  Medications Ordered in ED Medications - No data to display   Initial Impression / Assessment and Plan / ED Course  I have reviewed the triage vital signs and the nursing notes.  Pertinent labs & imaging results that were available during my care of the patient were reviewed by me and considered in my medical decision making (see chart for details).    Presentation sounds very much like his admission on January 29.  Medical screening here today with labs without any significant findings.   Chest x-ray negative.  Patient known to have been a drinker in the past alcohol levels negative.  Liver function tests with abnormalities but no significant change.  No real mental status change.  So not worried about elevated ammonia level.  Head CT was done just to be complete.  No evidence of stroke.  Still patient is baseline and can be discharged home.  Patient nontoxic no acute distress.   Final Clinical Impressions(s) / ED Diagnoses   Final diagnoses:  Tremors of nervous system    ED Discharge Orders    None       Fredia Sorrow, MD 04/22/17 1909

## 2017-04-22 NOTE — ED Triage Notes (Addendum)
Pt brought in due to increasing tremors and spastic like movements to all extremities and trunk Pt had stroke approx month ago and tremors started as result of stroke. Pt reports cant shave due to tremors or transfer himself from wheelchair. CGB 168

## 2017-04-24 ENCOUNTER — Other Ambulatory Visit (HOSPITAL_COMMUNITY)
Admission: RE | Admit: 2017-04-24 | Discharge: 2017-04-24 | Disposition: A | Discharge status: Home / Self Care | Payer: Self-pay | Source: Ambulatory Visit | Attending: Physician Assistant | Admitting: Physician Assistant

## 2017-04-24 DIAGNOSIS — G621 Alcoholic polyneuropathy: Secondary | ICD-10-CM | POA: Insufficient documentation

## 2017-04-24 DIAGNOSIS — E639 Nutritional deficiency, unspecified: Secondary | ICD-10-CM | POA: Insufficient documentation

## 2017-04-24 DIAGNOSIS — G63 Polyneuropathy in diseases classified elsewhere: Secondary | ICD-10-CM | POA: Insufficient documentation

## 2017-04-24 DIAGNOSIS — R748 Abnormal levels of other serum enzymes: Secondary | ICD-10-CM | POA: Insufficient documentation

## 2017-04-24 DIAGNOSIS — I10 Essential (primary) hypertension: Secondary | ICD-10-CM | POA: Insufficient documentation

## 2017-04-24 DIAGNOSIS — F32A Depression, unspecified: Secondary | ICD-10-CM

## 2017-04-24 DIAGNOSIS — E538 Deficiency of other specified B group vitamins: Secondary | ICD-10-CM | POA: Insufficient documentation

## 2017-04-24 DIAGNOSIS — Z125 Encounter for screening for malignant neoplasm of prostate: Secondary | ICD-10-CM | POA: Insufficient documentation

## 2017-04-24 DIAGNOSIS — F329 Major depressive disorder, single episode, unspecified: Secondary | ICD-10-CM | POA: Insufficient documentation

## 2017-04-24 DIAGNOSIS — G5793 Unspecified mononeuropathy of bilateral lower limbs: Secondary | ICD-10-CM | POA: Insufficient documentation

## 2017-04-24 LAB — COMPREHENSIVE METABOLIC PANEL
ALK PHOS: 95 U/L (ref 38–126)
ALT: 94 U/L — ABNORMAL HIGH (ref 17–63)
ANION GAP: 12 (ref 5–15)
AST: 179 U/L — ABNORMAL HIGH (ref 15–41)
Albumin: 2.8 g/dL — ABNORMAL LOW (ref 3.5–5.0)
BILIRUBIN TOTAL: 1.6 mg/dL — AB (ref 0.3–1.2)
BUN: 6 mg/dL (ref 6–20)
CALCIUM: 8.7 mg/dL — AB (ref 8.9–10.3)
CO2: 24 mmol/L (ref 22–32)
Chloride: 100 mmol/L — ABNORMAL LOW (ref 101–111)
Creatinine, Ser: 0.88 mg/dL (ref 0.61–1.24)
GFR calc Af Amer: >60 mL/min (ref >60)
GLUCOSE: 96 mg/dL (ref 65–99)
POTASSIUM: 3.4 mmol/L — AB (ref 3.5–5.1)
Sodium: 136 mmol/L (ref 135–145)
TOTAL PROTEIN: 6.6 g/dL (ref 6.5–8.1)

## 2017-04-24 LAB — CBC
HEMATOCRIT: 33.5 % — AB (ref 39.0–52.0)
HEMOGLOBIN: 11.7 g/dL — AB (ref 13.0–17.0)
MCH: 34.9 pg — AB (ref 26.0–34.0)
MCHC: 34.9 g/dL (ref 30.0–36.0)
MCV: 100 fL (ref 78.0–100.0)
Platelets: 251 10*3/uL (ref 150–400)
RBC: 3.35 MIL/uL — ABNORMAL LOW (ref 4.22–5.81)
RDW: 15.1 % (ref 11.5–15.5)
WBC: 5.6 10*3/uL (ref 4.0–10.5)

## 2017-04-24 LAB — PSA: PROSTATIC SPECIFIC ANTIGEN: 0.32 ng/mL (ref 0.00–4.00)

## 2017-04-24 LAB — FOLATE

## 2017-04-26 ENCOUNTER — Other Ambulatory Visit: Payer: Self-pay | Admitting: Physician Assistant

## 2017-04-26 MED ORDER — FLUOXETINE HCL 20 MG PO TABS: 20.0000 mg | ORAL_TABLET | Freq: Every day | ORAL | 0 refills | Status: DC

## 2017-04-27 LAB — VITAMIN B1: Vitamin B1 (Thiamine): 95.3 nmol/L (ref 66.5–200.0)

## 2017-04-27 LAB — VITAMIN B6: Vitamin B6: 59.6 ug/L — ABNORMAL HIGH (ref 5.3–46.7)

## 2017-05-03 ENCOUNTER — Encounter (HOSPITAL_COMMUNITY): Payer: Self-pay | Admitting: Cardiology

## 2017-05-03 ENCOUNTER — Emergency Department (HOSPITAL_COMMUNITY)
Admission: EM | Admit: 2017-05-03 | Discharge: 2017-05-03 | Disposition: A | Discharge status: Home / Self Care | Payer: Self-pay | Attending: Emergency Medicine | Admitting: Emergency Medicine

## 2017-05-03 ENCOUNTER — Emergency Department (HOSPITAL_COMMUNITY): Payer: Self-pay

## 2017-05-03 DIAGNOSIS — Y9389 Activity, other specified: Secondary | ICD-10-CM | POA: Insufficient documentation

## 2017-05-03 DIAGNOSIS — Y999 Unspecified external cause status: Secondary | ICD-10-CM | POA: Insufficient documentation

## 2017-05-03 DIAGNOSIS — Y929 Unspecified place or not applicable: Secondary | ICD-10-CM | POA: Insufficient documentation

## 2017-05-03 DIAGNOSIS — S8261XA Displaced fracture of lateral malleolus of right fibula, initial encounter for closed fracture: Secondary | ICD-10-CM

## 2017-05-03 DIAGNOSIS — Z79899 Other long term (current) drug therapy: Secondary | ICD-10-CM | POA: Insufficient documentation

## 2017-05-03 DIAGNOSIS — I10 Essential (primary) hypertension: Secondary | ICD-10-CM | POA: Insufficient documentation

## 2017-05-03 DIAGNOSIS — W010XXA Fall on same level from slipping, tripping and stumbling without subsequent striking against object, initial encounter: Secondary | ICD-10-CM | POA: Insufficient documentation

## 2017-05-03 DIAGNOSIS — Z87891 Personal history of nicotine dependence: Secondary | ICD-10-CM | POA: Insufficient documentation

## 2017-05-03 DIAGNOSIS — Z7982 Long term (current) use of aspirin: Secondary | ICD-10-CM | POA: Insufficient documentation

## 2017-05-03 MED ORDER — HYDROCODONE-ACETAMINOPHEN 5-325 MG PO TABS: 1.0000 | ORAL_TABLET | Freq: Four times a day (QID) | ORAL | 0 refills | Status: DC | PRN

## 2017-05-03 NOTE — ED Provider Notes (Signed)
[facility] EMERGENCY DEPARTMENT Provider Note   CSN: [id number] Arrival date & time: 05/03/17  0701     History   Chief Complaint Chief Complaint  Patient presents with  . Fall    HPI Tristan Cook is a 57 y.o. male.  HPI Patient states that 2 days ago he lost his balance and fell injuring his right leg.  States he has been able to weight-bear since though it is painful.  Denies any head injury or loss of consciousness.  Denies acute weakness or numbness. Past Medical History:  Diagnosis Date  . Abdominal pain   . Adenomatous colon polyp 2008  . Arthritis   . Back pain   . Bilateral lumbar radiculopathy   . Blurred vision, bilateral   . Chronic back pain   . Constipation   . Depression   . GERD (gastroesophageal reflux disease)   . Gout   . Hemorrhoids   . High cholesterol   . Hypertension   . Neck pain   . Neuropathy of both feet   . Numbness    rectal  . Numbness of legs   . Stroke ([place])   . Substance abuse ([place])    h/o excessive alcohol use; pt says he limits use to one to 2 beers daily he currently    Patient Active Problem List   Diagnosis Date Noted  . Weakness 04/11/2017  . Weakness of both legs 04/10/2017  . Vitamin B deficiency 01/22/2017  . 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 Stroke, small vessel ([place]) 01/17/2017  . 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 Vitamin B12 deficiency neuropathy ([place]) 01/17/2017  . History of colonic polyps 06/14/2016  . Elevated blood pressure reading 06/14/2016  . Hemorrhoids 09/20/2015  . Depression 08/03/2015  . Back pain with radiation 06/15/2015  . Insomnia 06/03/2015  . Hyperlipidemia LDL goal <130 04/25/2015  . 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 Fatigue 03/29/2015  . Allergic rhinitis 02/10/2015  . Esophageal stricture 10/22/2014  . Overweight (BMI 25.0-29.9) 10/06/2013  . GERD (gastroesophageal reflux disease) 04/23/2012  . Hemorrhoids, internal 11/02/2011  . Essential hypertension 07/25/2007   . Neck pain on left side 07/25/2007    Past Surgical History:  Procedure Laterality Date  . COLONOSCOPY  10/09/2006   3 mm pedunculated sigmoid colon polyp removed/8-mm sessile hepatic flexure polyp (tubular villous adenoma) removed/6-mm  descending colon polyp removed/small internal hemorrhoids  . COLONOSCOPY  02/28/2011   GUY:[id number], multiple in the rectum/Internal hemorrhoids, MODERATE-CAUSING RECTAL BLEEDING  . COLONOSCOPY N/A 07/05/2016   Procedure: COLONOSCOPY;  Surgeon: [name], MD;  Location: [facility];  Service: Endoscopy;  Laterality: N/A;  11:15am  . 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 Mild erosive gastrtitis most likely due to indomethacin   . FINGER SURGERY Right    4th and 5th digit  . FLEXIBLE SIGMOIDOSCOPY  01/02/2012   SLF: 1. the colonic mucosa appeared normal in the sigmoid colon 2. Large internal hemorrhoids: cause for rectal bleeding/pain . s/p banding X 3   . HAND SURGERY    . SAVORY DILATION N/A 07/21/2014   Procedure: SAVORY DILATION;  Surgeon: [name], MD;  Location: [facility] SU[facility];  Service: Endoscopy;  Laterality: N/A;       Home Medications    Prior to Admission medications   Medication Sig Start Date End Date Taking? Authorizing Provider  aspirin EC 81 MG tablet Take 81 mg by mouth daily.    Yes  [provider]  cholecalciferol (VITAMIN D) 1000 units tablet Take 2,000 Units by mouth daily.    Yes [provider]  docusate sodium (COLACE) 100 MG capsule Take 100 mg by mouth 3 (three) times daily as needed for mild constipation.   Yes [provider]  FLUoxetine (PROZAC) 20 MG tablet Take 1 tablet (20 mg total) by mouth daily. 04/26/17  Yes Soyla Dryer, PA-C  folic acid (FOLVITE) 341 MCG tablet Take 400 mcg by mouth daily.   Yes [provider]  meclizine (ANTIVERT) 25 MG tablet Take 1 tablet (25 mg total) by mouth 3 (three) times daily  as needed for dizziness. 04/03/17  Yes Lily Kocher, PA-C  metoprolol tartrate (LOPRESSOR) 25 MG tablet Take 1 tablet (25 mg total) by mouth 2 (two) times daily. Patient taking differently: Take 25 mg by mouth once.  02/14/17 05/15/17 Yes Rolland Porter, MD  Multiple Vitamin (MULTIVITAMIN WITH MINERALS) TABS tablet Take 1 tablet by mouth daily. 04/12/17  Yes Johnson, Clanford L, MD  OVER THE COUNTER MEDICATION Take 1 tablet by mouth daily. Men's Multivitamin   Yes [provider]  pyridoxine (B-6) 100 MG tablet Take 100 mg by mouth daily.   Yes [provider]  thiamine 100 MG tablet Take 1 tablet (100 mg total) by mouth daily. 04/12/17  Yes Johnson, Clanford L, MD  Turmeric 500 MG TABS Take 1 tablet by mouth daily.   Yes [provider]  vitamin B-12 (CYANOCOBALAMIN) 1000 MCG tablet Take 1,000 mcg by mouth daily.   Yes [provider]  vitamin E 400 UNIT capsule Take 400 Units by mouth daily.   Yes [provider]  amoxicillin (AMOXIL) 500 MG capsule Take 1 capsule (500 mg total) by mouth 3 (three) times daily. Patient not taking: Reported on 05/03/2017 04/17/17   Soyla Dryer, PA-C  HYDROcodone-acetaminophen (NORCO) 5-325 MG tablet Take 1 tablet by mouth every 6 (six) hours as needed for severe pain. 05/03/17   Julianne Rice, MD  naproxen (NAPROSYN) 500 MG tablet Take 1 tablet (500 mg total) by mouth 2 (two) times daily as needed. Patient not taking: Reported on 05/03/2017 04/17/17   Soyla Dryer, PA-C    Family History Family History  Problem Relation Age of Onset  . Hypertension Mother   . Hypertension Father   . Stroke Father   . Hypertension Sister   . Hypertension Brother   . Cancer Brother   . Cancer Brother        throat cancer  . Hypertension Brother   . Diabetes Maternal Grandmother   . Colon cancer Neg Hx     Social History Social History   Tobacco Use  . Smoking status: Former Smoker    Packs/day: 1.50    Years: 33.00     Pack years: 49.50    Types: Cigarettes    Last attempt to quit: 08/16/2004    Years since quitting: 12.7  . Smokeless tobacco: Current User    Types: Snuff  . Tobacco comment: Dips every once in a while  Substance Use Topics  . Alcohol use: Yes    Alcohol/week: 3.6 oz    Types: 6 Cans of beer per week    Comment: drinks 6 pack of beer on weekends  . Drug use: No     Allergies   Benadryl [diphenhydramine hcl (sleep)]   Review of Systems Review of Systems  Constitutional: Negative for chills and fever.  Respiratory: Negative for shortness of breath.  Cardiovascular: Negative for chest pain.  Gastrointestinal: Negative for abdominal pain, nausea and vomiting.  Musculoskeletal: Positive for arthralgias and joint swelling. Negative for neck pain and neck stiffness.  Skin: Negative for rash and wound.  Neurological: Negative for dizziness, weakness, light-headedness, numbness and headaches.  All other systems reviewed and are negative.    Physical Exam Updated Vital Signs BP (!) 164/93   Pulse (!) 107   Temp 99 F (37.2 C) (Oral)   Resp 18   Ht 6\' 2"  (1.88 m)   Wt 74.8 kg (165 lb)   SpO2 100%   BMI 21.18 kg/m   Physical Exam  Constitutional: He is oriented to person, place, and time. He appears well-developed and well-nourished. No distress.  HENT:  Head: Normocephalic and atraumatic.  Mouth/Throat: Oropharynx is clear and moist. No oropharyngeal exudate.  Eyes: EOM are normal. Pupils are equal, round, and reactive to light.  Neck: Normal range of motion. Neck supple.  Cardiovascular: Normal rate and regular rhythm. Exam reveals no gallop and no friction rub.  No murmur heard. Pulmonary/Chest: Effort normal and breath sounds normal. No stridor. No respiratory distress. He has no wheezes. He has no rales. He exhibits no tenderness.  Abdominal: Soft. Bowel sounds are normal. There is no tenderness. There is no rebound and no guarding.  Musculoskeletal: Normal range of  motion. He exhibits edema and tenderness.  Patient with tenderness to palpation over the lateral malleolus of the right ankle.  Swelling noted at the site.  No proximal fibular tenderness.  Patient has full range of motion of the right knee and hip.  Distal pulses are 2+.  Lymphadenopathy:    He has no cervical adenopathy.  Neurological: He is alert and oriented to person, place, and time.  5/5 motor in all extremities.  Sensation to light touch grossly intact.  Skin: Skin is warm and dry. No rash noted. He is not diaphoretic. No erythema.  Psychiatric: He has a normal mood and affect. His behavior is normal.  Nursing note and vitals reviewed.    ED Treatments / Results  Labs (all labs ordered are listed, but only abnormal results are displayed) Labs Reviewed - No data to display  EKG  EKG Interpretation None       Radiology Dg Tibia/fibula Right  Result Date: 05/03/2017 CLINICAL DATA:  Fall 2 days.  Swelling. EXAM: RIGHT TIBIA AND FIBULA - 2 VIEW COMPARISON:  12/29/2016.  11/04/2013.  06/19/2011. FINDINGS: Fracture with callus formation again noted the proximal fibular shaft. Fracture lucency remains. Tiny bony density noted adjacent to the medial malleolus, this may represent a tiny acute avulsion fracture. Adjacent corticated bony density is again noted in unchanged from prior exams consistent old avulsion fracture. Degenerative changes noted about the knee and ankle. Diffuse soft tissue swelling noted about the ankle. IMPRESSION: 1. Fracture with callus formation again noted about the proximal fibular shaft. Fracture lucency remains. 2. Tiny bony density noted adjacent to the medial malleolus. This may represent a tiny acute avulsion fracture. Adjacent corticated bony density is again noted in unchanged position from prior exams consistent with old avulsion fracture. Diffuse soft tissue swelling is noted about the ankle. Electronically Signed   By: Marcello Moores  Register   On: 05/03/2017  07:49   Dg Ankle Complete Right  Result Date: 05/03/2017 CLINICAL DATA:  Fall 2 days prior to imaging onto the right knee. EXAM: RIGHT ANKLE - COMPLETE 3+ VIEW COMPARISON:  Radiographs from 06/19/2011 FINDINGS: Well corticated ossific structures below the  medial malleolus are present. Compared to the prior exam there was previously a notable talar spur in this vicinity, and the appearance today may reflect chronic fragmentation of that spur. There is abnormal soft tissue swelling overlying both malleoli. Moreover, there is subtle cortical discontinuity in the distal metaphysis of the lateral malleolus not present back on 06/19/2011, suspicious for a nondisplaced fracture. Tibiotalar joint effusion. Faint linear calcification along the anterior margin of the suspected joint effusion. There is also talar spurring adjacent to the tip of the medial malleolus. IMPRESSION: 1. There is high suspicion for a nondisplaced lateral malleolar fracture at the level of the distal metaphysis, probably a Weber B fracture. Overlying soft tissue swelling with accompanying tibiotalar joint effusion. CT or MRI could be useful for confirmation of the fracture and further characterization. 2. There is also soft tissue swelling below the medial malleolus. Several well corticated ossific structures below the medial malleolus likely represent chronically fragmented spurs. Electronically Signed   By: Van Clines M.D.   On: 05/03/2017 08:12   Ct Ankle Right Wo Contrast  Result Date: 05/03/2017 CLINICAL DATA:  Right ankle pain secondary to fall. EXAM: CT OF THE RIGHT ANKLE WITHOUT CONTRAST TECHNIQUE: Multidetector CT imaging of the right ankle was performed according to the standard protocol. Multiplanar CT image reconstructions were also generated. COMPARISON:  None. FINDINGS: Bones/Joint/Cartilage Generalized osteopenia. Nondisplaced oblique fracture of the distal fibular metaphysis without displacement or angulation. Overlying  soft tissue swelling. Well corticated bony fragments adjacent to the medial malleolus likely reflecting sequela of old avulsive injury. Single sliver of bone along the anteromedial malleolus may reflect a slightly more acute avulsion fracture. No other acute fracture or dislocation. Ankle mortise is intact. Mild osteoarthritis of the tibiotalar joint. Mild osteoarthritis of the second tarsometatarsal joint. Mild osteoarthritis of the first MTP joint. Ligaments Suboptimally assessed by CT. Muscles and Tendons Muscles are normal. Flexor, extensor, peroneal and Achilles tendons are intact. Plantar fascia is grossly intact. Soft tissues No fluid collection or hematoma. Soft tissue edema along the dorsal aspect of the foot and ankle. Soft tissue edema circumferentially around the ankle. IMPRESSION: 1. Nondisplaced oblique fracture of the distal fibular metaphysis without displacement or angulation. Overlying soft tissue swelling. 2. Well corticated bony fragments adjacent to the medial malleolus likely reflecting sequela of old avulsive injury. Single sliver of bone along the anteromedial malleolus may reflect a slightly more acute avulsion fracture. Electronically Signed   By: Kathreen Devoid   On: 05/03/2017 10:27   Dg Foot Complete Right  Result Date: 05/03/2017 CLINICAL DATA:  57 year old male status post fall 2 days ago. Pain and swelling. EXAM: RIGHT FOOT COMPLETE - 3+ VIEW COMPARISON:  Right foot series 11/04/2013. FINDINGS: Bone mineralization remains normal. Stable tarsal bones including chronic degenerative spurring at the anterior talus. Superimposed chronic probable posttraumatic ossific fragment distal to the distal tibia medial malleolus. Tarsal bone alignment and joint spaces within normal limits. Metatarsals appear stable and intact. Mild 1st MTP joint degeneration. No phalanx fracture identified. IMPRESSION: No acute fracture or dislocation identified about the right foot. Electronically Signed   By: Genevie Ann M.D.   On: 05/03/2017 07:42    Procedures Procedures (including critical care time)  Medications Ordered in ED Medications - No data to display   Initial Impression / Assessment and Plan / ED Course  I have reviewed the triage vital signs and the nursing notes.  Pertinent labs & imaging results that were available during my care of the patient  were reviewed by me and considered in my medical decision making (see chart for details).     CT demonstrates distal right fibular fracture.  Patient also has some older appearing avulsion fractures of the medial malleolus.  Placed in a splint and given referral to orthopedics.  Return precautions given.  Final Clinical Impressions(s) / ED Diagnoses   Final diagnoses:  Closed displaced fracture of lateral malleolus of right fibula, initial encounter    ED Discharge Orders        Ordered    HYDROcodone-acetaminophen (NORCO) 5-325 MG tablet  Every 6 hours PRN     05/03/17 1143       Julianne Rice, MD 05/03/17 1144

## 2017-05-03 NOTE — ED Triage Notes (Signed)
Fell 2 days ago while going to mail box.  C/o pain to right lower leg.

## 2017-05-10 ENCOUNTER — Telehealth: Payer: Self-pay | Admitting: Orthopedic Surgery

## 2017-05-10 NOTE — Telephone Encounter (Signed)
Tristan Cook  called in late this morning stating that he went to the ER last Thursday.  He has a fractured ankle and needs an appointment.    Please review and advise where to put him on the schedule.  Thanks

## 2017-05-11 NOTE — Telephone Encounter (Signed)
1:40 on march 5 th

## 2017-05-15 ENCOUNTER — Ambulatory Visit (INDEPENDENT_AMBULATORY_CARE_PROVIDER_SITE_OTHER): Payer: Self-pay | Admitting: Orthopedic Surgery

## 2017-05-15 ENCOUNTER — Encounter: Payer: Self-pay | Admitting: Orthopedic Surgery

## 2017-05-15 ENCOUNTER — Ambulatory Visit: Payer: Self-pay | Admitting: Physician Assistant

## 2017-05-15 VITALS — BP 154/97 | HR 93 | Ht 74.0 in

## 2017-05-15 DIAGNOSIS — S8264XA Nondisplaced fracture of lateral malleolus of right fibula, initial encounter for closed fracture: Secondary | ICD-10-CM

## 2017-05-15 MED ORDER — HYDROCODONE-ACETAMINOPHEN 5-325 MG PO TABS: 1.0000 | ORAL_TABLET | Freq: Four times a day (QID) | ORAL | 0 refills | Status: DC | PRN

## 2017-05-15 MED ORDER — OXYCODONE-ACETAMINOPHEN 5-325 MG PO TABS: 1.0000 | ORAL_TABLET | Freq: Three times a day (TID) | ORAL | 0 refills | Status: DC | PRN

## 2017-05-15 NOTE — Addendum Note (Signed)
Addended by: Arther Abbott E on: 05/15/2017 02:20 PM   Modules accepted: Orders

## 2017-05-15 NOTE — Telephone Encounter (Signed)
-----   Message from Carole Civil, MD sent at 05/11/2017  7:48 AM EST ----- rx Adelfa Koh

## 2017-05-15 NOTE — Progress Notes (Addendum)
NEW PATIENT OFFICE VISIT   Chief Complaint  Patient presents with  . Ankle Injury    right ankle 05/01/17    57 year old male presents for evaluation of right ankle injury  He had a fall on February 19 x-ray and CT scan revealed a nondisplaced lateral malleolus fracture with intact ankle mortise and syndesmosis.  He complains of severe pain at night he tried to take hydrocodone and it gave him hallucinations so he cannot take that.  He complains of moderate to severe lateral ankle pain lateral aspect now for 2 weeks associated with minimal swelling but decreased range of motion    Review of Systems  Constitutional: Negative for chills and fever.  Skin: Negative.   Neurological: Positive for sensory change.     Past Medical History:  Diagnosis Date  . Abdominal pain   . Adenomatous colon polyp 2008  . Arthritis   . Back pain   . Bilateral lumbar radiculopathy   . Blurred vision, bilateral   . Chronic back pain   . Constipation   . Depression   . GERD (gastroesophageal reflux disease)   . Gout   . Hemorrhoids   . High cholesterol   . Hypertension   . Neck pain   . Neuropathy of both feet   . Numbness    rectal  . Numbness of legs   . Stroke ([place])   . Substance abuse ([place])    h/o excessive alcohol use; pt says he limits use to one to 2 beers daily he currently    Past Surgical History:  Procedure Laterality Date  . COLONOSCOPY  10/09/2006   3 mm pedunculated sigmoid colon polyp removed/8-mm sessile hepatic flexure polyp (tubular villous adenoma) removed/6-mm  descending colon polyp removed/small internal hemorrhoids  . COLONOSCOPY  02/28/2011   [id number], multiple in the rectum/Internal hemorrhoids, MODERATE-CAUSING RECTAL BLEEDING  . COLONOSCOPY N/A 07/05/2016   Procedure: COLONOSCOPY;  Surgeon: [name], MD;  [facility];  Service: Endoscopy;  Laterality: N/A;  11:15am  . 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 Mild erosive gastrtitis most likely due to indomethacin   . FINGER SURGERY Right    4th and 5th digit  . FLEXIBLE SIGMOIDOSCOPY  01/02/2012   SLF: 1. the colonic mucosa appeared normal in the sigmoid colon 2. Large internal hemorrhoids: cause for rectal bleeding/pain . s/p banding X 3   . HAND SURGERY    . SAVORY DILATION N/A 07/21/2014   Procedure: SAVORY DILATION;  Surgeon: [name], MD;  Location: [facility];  Service: Endoscopy;  Laterality: N/A;    Family History  Problem Relation Age of Onset  . Hypertension Mother   . Hypertension Father   . Stroke Father   . Hypertension Sister   . Hypertension Brother   . Cancer Brother   . Cancer Brother        throat cancer  . Hypertension Brother   . Diabetes Maternal Grandmother   . Colon cancer Neg Hx    Social History   Tobacco Use  . Smoking status: Former Smoker    Packs/day: 1.50    Years: 33.00    Pack years: 49.50    Types: Cigarettes    Last attempt to quit: 08/16/2004    Years since quitting: 12.7  . Smokeless tobacco: Current User    Types: Snuff  . Tobacco comment: Dips every once in a while  Substance Use Topics  .  Alcohol use: Yes    Alcohol/week: 3.6 oz    Types: 6 Cans of beer per week    Comment: drinks 6 pack of beer on weekends  . Drug use: No    Allergies  Allergen Reactions  . Benadryl [Diphenhydramine Hcl (Sleep)] Other (See Comments)    Palpitations     Current Meds  Medication Sig  . aspirin EC 81 MG tablet Take 81 mg by mouth daily.   . cholecalciferol (VITAMIN D) 1000 units tablet Take 2,000 Units by mouth daily.   [name] docusate sodium (COLACE) 100 MG capsule Take 100 mg by mouth 3 (three) times daily as needed for mild constipation.  [name] FLUoxetine (PROZAC) 20 MG tablet Take 1 tablet (20 mg total) by mouth daily.  . folic acid (FOLVITE) 254 MCG tablet Take 400 mcg by mouth daily.  . meclizine (ANTIVERT) 25 MG tablet Take 1 tablet (25 mg total) by mouth 3  (three) times daily as needed for dizziness.  . metoprolol tartrate (LOPRESSOR) 25 MG tablet Take 1 tablet (25 mg total) by mouth 2 (two) times daily. (Patient taking differently: Take 25 mg by mouth once. )  . Multiple Vitamin (MULTIVITAMIN WITH MINERALS) TABS tablet Take 1 tablet by mouth daily.  . naproxen (NAPROSYN) 500 MG tablet Take 1 tablet (500 mg total) by mouth 2 (two) times daily as needed.  . pyridoxine (B-6) 100 MG tablet Take 100 mg by mouth daily.  [name] thiamine 100 MG tablet Take 1 tablet (100 mg total) by mouth daily.  . Turmeric 500 MG TABS Take 1 tablet by mouth daily.  . vitamin B-12 (CYANOCOBALAMIN) 1000 MCG tablet Take 1,000 mcg by mouth daily.  . vitamin E 400 UNIT capsule Take 400 Units by mouth daily.  . [DISCONTINUED] OVER THE COUNTER MEDICATION Take 1 tablet by mouth daily. Men's Multivitamin    BP (!) 154/97   Pulse 93   Ht 6\' 2"  (1.88 m)   BMI 21.18 kg/m   Physical Exam  Constitutional: He is oriented to person, place, and time. He appears well-developed and well-nourished.  Vital signs have been reviewed and are stable. Gen. appearance the patient is well-developed and well-nourished with normal grooming and hygiene.   Musculoskeletal:  He is mainly ambulatory with his wheelchair waiting for opinion on weightbearing  Neurological: He is alert and oriented to person, place, and time.  Skin: Skin is warm and dry. No erythema.  Psychiatric: He has a normal mood and affect.  Vitals reviewed.   Ortho Exam   Right ankle inspection reveals 2 blisters over the front of the ankle they are superficial there is no erythema or drainage.  His ankle range of motion is limited to 20 degrees total it is stable he has tenderness at the anterolateral ankle and fibula muscle tone normal skin as stated no other swelling pulse and perfusion normal he has altered sensation.    MEDICAL DECISION SECTION  xrays ordered?  No  My independent reading of xrays: Taken at the  hospital show a nondisplaced fibular fracture in the CAT scan shows intact ankle mortise no additional fractures   Encounter Diagnosis  Name Primary?  . Closed nondisplaced fracture of lateral malleolus of right fibula, initial encounter Yes     PLAN:   No orders of the defined types were placed in this encounter.  Injection? no MRI/CT/? No  Weightbearing as tolerated with Cam walker Repeat x-ray 6 weeks  Databank checked

## 2017-05-15 NOTE — Patient Instructions (Signed)
weightbearing as tolerated with walker for six weeks

## 2017-05-23 ENCOUNTER — Ambulatory Visit: Payer: Self-pay | Admitting: Physician Assistant

## 2017-05-28 ENCOUNTER — Encounter: Payer: Self-pay | Admitting: Physician Assistant

## 2017-06-11 ENCOUNTER — Emergency Department (HOSPITAL_COMMUNITY)
Admission: EM | Admit: 2017-06-11 | Discharge: 2017-06-12 | Disposition: A | Discharge status: Home / Self Care | Payer: Self-pay | Attending: Emergency Medicine | Admitting: Emergency Medicine

## 2017-06-11 ENCOUNTER — Encounter (HOSPITAL_COMMUNITY): Payer: Self-pay | Admitting: *Deleted

## 2017-06-11 ENCOUNTER — Other Ambulatory Visit: Payer: Self-pay

## 2017-06-11 DIAGNOSIS — Z79899 Other long term (current) drug therapy: Secondary | ICD-10-CM | POA: Insufficient documentation

## 2017-06-11 DIAGNOSIS — I10 Essential (primary) hypertension: Secondary | ICD-10-CM | POA: Insufficient documentation

## 2017-06-11 DIAGNOSIS — Z7982 Long term (current) use of aspirin: Secondary | ICD-10-CM | POA: Insufficient documentation

## 2017-06-11 DIAGNOSIS — F17290 Nicotine dependence, other tobacco product, uncomplicated: Secondary | ICD-10-CM | POA: Insufficient documentation

## 2017-06-11 DIAGNOSIS — M62838 Other muscle spasm: Secondary | ICD-10-CM | POA: Insufficient documentation

## 2017-06-11 LAB — CBC WITH DIFFERENTIAL/PLATELET
Basophils Absolute: 0 10*3/uL (ref 0.0–0.1)
Basophils Relative: 0 %
Eosinophils Absolute: 0.1 10*3/uL (ref 0.0–0.7)
Eosinophils Relative: 3 %
HEMATOCRIT: 37.5 % — AB (ref 39.0–52.0)
HEMOGLOBIN: 13.1 g/dL (ref 13.0–17.0)
LYMPHS ABS: 1.2 10*3/uL (ref 0.7–4.0)
LYMPHS PCT: 39 %
MCH: 32.5 pg (ref 26.0–34.0)
MCHC: 34.9 g/dL (ref 30.0–36.0)
MCV: 93.1 fL (ref 78.0–100.0)
MONOS PCT: 12 %
Monocytes Absolute: 0.4 10*3/uL (ref 0.1–1.0)
NEUTROS PCT: 46 %
Neutro Abs: 1.4 10*3/uL — ABNORMAL LOW (ref 1.7–7.7)
Platelets: 175 10*3/uL (ref 150–400)
RBC: 4.03 MIL/uL — AB (ref 4.22–5.81)
RDW: 13.1 % (ref 11.5–15.5)
WBC: 3.1 10*3/uL — AB (ref 4.0–10.5)

## 2017-06-11 MED ORDER — CYCLOBENZAPRINE HCL 10 MG PO TABS
10.0000 mg | ORAL_TABLET | Freq: Once | ORAL | Status: AC
  Administered 2017-06-11: 10 mg via ORAL
  Filled 2017-06-11: qty 1

## 2017-06-11 NOTE — ED Triage Notes (Signed)
Pt c/o muscle spasms all over body that has gotten progressively worse the last 3 days; pt admits to drinking a six pack of beer today

## 2017-06-12 LAB — COMPREHENSIVE METABOLIC PANEL
ALBUMIN: 2.7 g/dL — AB (ref 3.5–5.0)
ALT: 63 U/L (ref 17–63)
ANION GAP: 10 (ref 5–15)
AST: 190 U/L — ABNORMAL HIGH (ref 15–41)
Alkaline Phosphatase: 96 U/L (ref 38–126)
BILIRUBIN TOTAL: 0.8 mg/dL (ref 0.3–1.2)
BUN: 7 mg/dL (ref 6–20)
CALCIUM: 8.3 mg/dL — AB (ref 8.9–10.3)
CHLORIDE: 105 mmol/L (ref 101–111)
CO2: 24 mmol/L (ref 22–32)
CREATININE: 0.99 mg/dL (ref 0.61–1.24)
GFR calc Af Amer: >60 mL/min (ref >60)
GFR calc non Af Amer: >60 mL/min (ref >60)
Glucose, Bld: 93 mg/dL (ref 65–99)
Potassium: 3.8 mmol/L (ref 3.5–5.1)
SODIUM: 139 mmol/L (ref 135–145)
Total Protein: 6.4 g/dL — ABNORMAL LOW (ref 6.5–8.1)

## 2017-06-12 LAB — CK: CK TOTAL: 88 U/L (ref 49–397)

## 2017-06-12 MED ORDER — CYCLOBENZAPRINE HCL 5 MG PO TABS: 5.0000 mg | ORAL_TABLET | Freq: Three times a day (TID) | ORAL | 0 refills | Status: DC | PRN

## 2017-06-12 MED ORDER — CALCIUM CARBONATE ANTACID 500 MG PO CHEW
800.0000 mg | CHEWABLE_TABLET | Freq: Once | ORAL | Status: AC
  Administered 2017-06-12: 800 mg via ORAL
  Filled 2017-06-12: qty 4

## 2017-06-12 NOTE — Discharge Instructions (Addendum)
Take the muscle relaxer as prescribed if needed for spasm.  A heating pad can also be helpful. See the other recommendations below.  Your calcium level is borderline low, but should be not the cause of your muscle spasm.  I do recommend you take a Tums tablet daily which is a good source of calcium .

## 2017-06-12 NOTE — ED Provider Notes (Signed)
[facility] EMERGENCY DEPARTMENT Provider Note   CSN: [id number] Arrival date & time: 06/11/17  2215     History   Chief Complaint Chief Complaint  Patient presents with  . Generalized Body Aches    HPI ULYSSE Cook is a 57 y.o. male with a history of HTN, elevated cholesterol (not currently on cholesterol medication), chronic back pain with radiculopathy and etoh abuse, reporting he drank a 6 pack of beer today, with a 3 day history of intermittent muscle spasms, reporting random fleeting spasm in his arms, back and upper thighs.  He denies any over exertion or reasons for dehydration, reporting drinking plenty of fluids (not just etoh) and denies fevers, chills or other complaint.  He has found no alleviators.   The history is provided by the patient.    Past Medical History:  Diagnosis Date  . Abdominal pain   . Adenomatous colon polyp 2008  . Arthritis   . Back pain   . Bilateral lumbar radiculopathy   . Blurred vision, bilateral   . Chronic back pain   . Constipation   . Depression   . GERD (gastroesophageal reflux disease)   . Gout   . Hemorrhoids   . High cholesterol   . Hypertension   . Neck pain   . Neuropathy of both feet   . Numbness    rectal  . Numbness of legs   . Stroke ([place])   . Substance abuse ([place])    h/o excessive alcohol use; pt says he limits use to one to 2 beers daily he currently    Patient Active Problem List   Diagnosis Date Noted  . Weakness 04/11/2017  . Weakness of both legs 04/10/2017  . Vitamin B deficiency 01/22/2017  . 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 Stroke, small vessel ([place]) 01/17/2017  . 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 Vitamin B12 deficiency neuropathy ([place]) 01/17/2017  . History of colonic polyps 06/14/2016  . Elevated blood pressure reading 06/14/2016  . Hemorrhoids 09/20/2015  . Depression 08/03/2015  . Back pain with radiation 06/15/2015  . Insomnia 06/03/2015  . Hyperlipidemia LDL goal <130  04/25/2015  . 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 Fatigue 03/29/2015  . Allergic rhinitis 02/10/2015  . Esophageal stricture 10/22/2014  . Overweight (BMI 25.0-29.9) 10/06/2013  . GERD (gastroesophageal reflux disease) 04/23/2012  . Hemorrhoids, internal 11/02/2011  . Essential hypertension 07/25/2007  . Neck pain on left side 07/25/2007    Past Surgical History:  Procedure Laterality Date  . COLONOSCOPY  10/09/2006   3 mm pedunculated sigmoid colon polyp removed/8-mm sessile hepatic flexure polyp (tubular villous adenoma) removed/6-mm  descending colon polyp removed/small internal hemorrhoids  . COLONOSCOPY  02/28/2011   YSA:YTKZSW, multiple in the rectum/Internal hemorrhoids, MODERATE-CAUSING RECTAL BLEEDING  . COLONOSCOPY N/A 07/05/2016   Procedure: COLONOSCOPY;  Surgeon: [name], MD;  Location: [facility] END[facility];  Service: Endoscopy;  Laterality: N/A;  11:15am  . 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 Mild erosive gastrtitis most likely due to indomethacin   . FINGER SURGERY Right    4th and 5th digit  . FLEXIBLE SIGMOIDOSCOPY  01/02/2012   SLF: 1. the colonic mucosa appeared normal in the sigmoid colon 2. Large internal hemorrhoids: cause for rectal bleeding/pain . s/p banding X 3   . HAND SURGERY    . SAVORY DILATION N/A 07/21/2014   Procedure: SAVORY DILATION;  Surgeon: [name], MD;  Location: [facility] END[facility] SU[facility];  Service: Endoscopy;  Laterality: N/A;        Home Medications    Prior to Admission medications   Medication Sig Start Date End Date Taking? Authorizing Provider  aspirin EC 81 MG tablet Take 81 mg by mouth daily.     [provider]  cholecalciferol (VITAMIN D) 1000 units tablet Take 2,000 Units by mouth daily.     [provider]  cyclobenzaprine (FLEXERIL) 5 MG tablet Take 1 tablet (5 mg total) by mouth 3 (three) times daily as needed for muscle spasms. 06/12/17   Evalee Jefferson,  PA-C  docusate sodium (COLACE) 100 MG capsule Take 100 mg by mouth 3 (three) times daily as needed for mild constipation.    [provider]  FLUoxetine (PROZAC) 20 MG tablet Take 1 tablet (20 mg total) by mouth daily. 04/26/17   Soyla Dryer, PA-C  folic acid (FOLVITE) 308 MCG tablet Take 400 mcg by mouth daily.    [provider]  HYDROcodone-acetaminophen (NORCO) 5-325 MG tablet Take 1 tablet by mouth every 6 (six) hours as needed for severe pain. Patient not taking: Reported on 05/15/2017 05/15/17   Carole Civil, MD  meclizine (ANTIVERT) 25 MG tablet Take 1 tablet (25 mg total) by mouth 3 (three) times daily as needed for dizziness. 04/03/17   Lily Kocher, PA-C  metoprolol tartrate (LOPRESSOR) 25 MG tablet Take 1 tablet (25 mg total) by mouth 2 (two) times daily. Patient taking differently: Take 25 mg by mouth once.  02/14/17 05/15/17  Rolland Porter, MD  Multiple Vitamin (MULTIVITAMIN WITH MINERALS) TABS tablet Take 1 tablet by mouth daily. 04/12/17   Johnson, Clanford L, MD  naproxen (NAPROSYN) 500 MG tablet Take 1 tablet (500 mg total) by mouth 2 (two) times daily as needed. 04/17/17   Soyla Dryer, PA-C  oxyCODONE-acetaminophen (PERCOCET/ROXICET) 5-325 MG tablet Take 1 tablet by mouth every 8 (eight) hours as needed for severe pain. 05/15/17   Carole Civil, MD  pyridoxine (B-6) 100 MG tablet Take 100 mg by mouth daily.    [provider]  thiamine 100 MG tablet Take 1 tablet (100 mg total) by mouth daily. 04/12/17   Johnson, Clanford L, MD  Turmeric 500 MG TABS Take 1 tablet by mouth daily.    [provider]  vitamin B-12 (CYANOCOBALAMIN) 1000 MCG tablet Take 1,000 mcg by mouth daily.    [provider]  vitamin E 400 UNIT capsule Take 400 Units by mouth daily.    [provider]    Family History Family History  Problem Relation Age of Onset  . Hypertension Mother   . Hypertension Father   . Stroke Father   . Hypertension  Sister   . Hypertension Brother   . Cancer Brother   . Cancer Brother        throat cancer  . Hypertension Brother   . Diabetes Maternal Grandmother   . Colon cancer Neg Hx     Social History Social History   Tobacco Use  . Smoking status: Former Smoker    Packs/day: 1.50    Years: 33.00    Pack years: 49.50    Types: Cigarettes    Last attempt to quit: 08/16/2004    Years since quitting: 12.8  . Smokeless tobacco: Current User    Types: Snuff  . Tobacco comment: Dips every once in a while  Substance Use Topics  . Alcohol use: Yes    Alcohol/week: 3.6 oz    Types: 6  Cans of beer per week    Comment: drinks 6 pack of beer on weekends  . Drug use: No     Allergies   Hydrocodone and Benadryl [diphenhydramine hcl (sleep)]   Review of Systems Review of Systems  Constitutional: Negative for chills and fever.  HENT: Positive for sore throat.   Eyes: Negative.   Respiratory: Negative for chest tightness and shortness of breath.   Cardiovascular: Negative for chest pain.  Gastrointestinal: Negative.  Negative for abdominal pain, nausea and vomiting.  Genitourinary: Negative.   Musculoskeletal: Positive for arthralgias and back pain. Negative for joint swelling and neck pain.  Skin: Negative.  Negative for rash and wound.  Neurological: Negative for dizziness, tremors, weakness, light-headedness, numbness and headaches.  Psychiatric/Behavioral: Negative.      Physical Exam Updated Vital Signs BP 136/80 (BP Location: Right Arm)   Pulse 98   Temp 97.8 F (36.6 C) (Oral)   Resp 19   Ht 6\' 2"  (1.88 m)   Wt 74.8 kg (165 lb)   SpO2 100%   BMI 21.18 kg/m   Physical Exam  Constitutional: He is oriented to person, place, and time. He appears well-developed and well-nourished.  HENT:  Head: Normocephalic and atraumatic.  Eyes: Conjunctivae are normal.  Neck: Normal range of motion.  Cardiovascular: Normal rate, regular rhythm, normal heart sounds and intact distal  pulses.  Pulmonary/Chest: Effort normal and breath sounds normal. He has no wheezes.  Abdominal: Soft. Bowel sounds are normal. He exhibits no mass. There is no tenderness. There is no guarding.  Musculoskeletal: Normal range of motion.  Neurological: He is alert and oriented to person, place, and time. He displays no tremor. He exhibits normal muscle tone. He displays no seizure activity.  Reflex Scores:      Bicep reflexes are 2+ on the right side and 2+ on the left side.      Patellar reflexes are 2+ on the right side and 2+ on the left side. Equal grip strength  Skin: Skin is warm and dry.  Psychiatric: He has a normal mood and affect.  Nursing note and vitals reviewed.    ED Treatments / Results  Labs (all labs ordered are listed, but only abnormal results are displayed) Labs Reviewed  CBC WITH DIFFERENTIAL/PLATELET - Abnormal; Notable for the following components:      Result Value   WBC 3.1 (*)    RBC 4.03 (*)    HCT 37.5 (*)    Neutro Abs 1.4 (*)    All other components within normal limits  COMPREHENSIVE METABOLIC PANEL - Abnormal; Notable for the following components:   Calcium 8.3 (*)    Total Protein 6.4 (*)    Albumin 2.7 (*)    AST 190 (*)    All other components within normal limits    EKG None  Radiology No results found.  Procedures Procedures (including critical care time)  Medications Ordered in ED Medications  calcium carbonate (TUMS - dosed in mg elemental calcium) chewable tablet 800 mg of elemental calcium (has no administration in time range)  cyclobenzaprine (FLEXERIL) tablet 10 mg (10 mg Oral Given 06/11/17 2335)     Initial Impression / Assessment and Plan / ED Course  I have reviewed the triage vital signs and the nursing notes.  Pertinent labs & imaging results that were available during my care of the patient were reviewed by me and considered in my medical decision making (see chart for details).     Muscle  spasms of unclear  etiology.  No sx appreciated during ed visit.  He was given flexeril tablet here,  prescription for same.  Discussed ca++ being borderline low, but should not be source of sx.  Advised taking tums tablets daily for increased calcium intake.    Final Clinical Impressions(s) / ED Diagnoses   Final diagnoses:  Muscle spasm  Hypocalcemia    ED Discharge Orders        Ordered    cyclobenzaprine (FLEXERIL) 5 MG tablet  3 times daily PRN     06/12/17 0133       Evalee Jefferson, PA-C 06/12/17 0147    Ezequiel Essex, MD 06/12/17 [phone]

## 2017-06-15 ENCOUNTER — Telehealth: Payer: Self-pay

## 2017-06-15 NOTE — Telephone Encounter (Signed)
[facility] Connect Note: Referral made for RN Case manager.  Called Mr. Youngblood to discuss setting an appointment to discuss his current medical, housing and other needs. Appointment set for 06/19/17 at 0930 at the [facility] on [address].  Client given RN contact information and is to call if he is unable to make his appointment. Client states understanding.  Debria Garret RN [phone]  Care Connect RN Case Manager

## 2017-06-18 ENCOUNTER — Telehealth: Payer: Self-pay | Admitting: Orthopedic Surgery

## 2017-06-18 NOTE — Telephone Encounter (Signed)
Patient called to request refill, pain medication: HYDROcodone-acetaminophen (NORCO) 5-325 MG tablet 10 tablet   -[place]

## 2017-06-18 NOTE — Telephone Encounter (Signed)
He has hallucinations with Hydrocodone.

## 2017-06-19 NOTE — Congregational Nurse Program (Signed)
Congregational Nurse Program Note  Date of Encounter: 06/19/2017  Past Medical History: Past Medical History:  Diagnosis Date  . Abdominal pain   . Adenomatous colon polyp 2008  . Arthritis   . Back pain   . Bilateral lumbar radiculopathy   . Blurred vision, bilateral   . Chronic back pain   . Constipation   . Depression   . GERD (gastroesophageal reflux disease)   . Gout   . Hemorrhoids   . High cholesterol   . Hypertension   . Neck pain   . Neuropathy of both feet   . Numbness    rectal  . Numbness of legs   . Stroke ([place])   . Substance abuse ([place])    h/o excessive alcohol use; pt says he limits use to one to 2 beers daily he currently    Encounter Details: CNP Questionnaire - 06/19/17 0930      Questionnaire   Patient Status  Not Applicable    Race  Black or African American    Location Patient Served At  Not Applicable    Insurance  Not Applicable    Uninsured  Uninsured (NEW 1x/quarter)    Food  No food insecurities    Housing/Utilities  Worried about losing housing    Transportation  Yes, need transportation assistance;Within past 12 months, lack of transportation negatively impacted life    Interpersonal Safety  Yes, feel physically and emotionally safe where you currently live    Medication  Yes, have medication insecurities    Medical Provider  No    Referrals  [place] Environmental manager;Other    ED Visit Averted  Not Applicable    Life-Saving Intervention Made  Not Applicable      [facility]ARE [facility] RN CASE MANAGEMENT ENCOUNTER:  Met with client today to discuss concerns and needs regarding his medical, social and mental health needs.  Client concerns: 1). Housing 2). Medical provider 3). Mental Health provider 4). Medicaid application 5). Transportation needs for medical and mental health appointments 6). Disability filing status 7). Food and clothing 8). Lost IDs and Social Security cards. 9). Specialist care  Client is alert and  oriented to person, place and time. Client uses walker for mobility, recent Right ankle fracture, client with right walking boot. Appears frail. Complains of numbness and tingling in extremities x 4. ( not new) States numbness has been diagnosed as neuropathy by neurologist. Last neurology appointment with Dr. Jaynee Eagles on 01/17/17. Client states he has gone through many changes in his life, including, foreclosure of his home and loss of vehicle. He states he is currently living with his sister and that is reliable housing at present. He currently has no income and no health insurance. He states that during his last hospital admission in January 2019 that he applied for medicaid then. He states he has filed for disability and has been inquiring about disability specialist to assist in [place]. He states he saw the AD on Facebook and called, however, he has lost the contact information. Client states that last week, he lost his Driver's license and his social security card. Client states that he does receive food stamps of $187.00 per month and that his sister cooks meals for him.  Client is a former patient at the [facility] [place]. Client states he was dismissed from there due to not being able to make his appointments due to transportation issues.Last seen there on 04/17/17. Client reports he is taking his vitamins he  has been prescribed, but states he has not taken his metoprolol nor Prozac for several weeks. He states that Dr. Aline Brochure has been prescribing pain medication for his ankle.and that he called Dr. Ruthe Mannan office for a refill this week and is waiting to hear from them. Client has an upcoming appointment with Dr. Aline Brochure for follow up this month.  Mental Health client states he was at one time going to [facility], but has not gone back due to transportation needs. Client denies Suicidal thoughts, but does report feeling depressed regarding his current state regarding his health issues, housing  status. Client would like to return, but has no transportation. Client denies drug abuse, but does report he still consume alcohol, reports to 4 to 5 beers a week. Denies moonshine intake. Discussed client's concerns regarding above listed concerns/needs. We discussed a plan as noted below.  1). Housing: RN called [facility] regarding application client states he filed last year. Housing Authority discussed that they have no record of application on file. Client is to complete an online housing application to be placed on the waiting list. To complete this client needs to replace ID of driver's license and Social Security care and birth certificate.  2).Medical provider: Conf[facility]. Client is given list of needed paperwork to screen for [place] MAP program. List of needed materials highlighted and given to client with a Care Connect brochure as well as a Quincy Simmonds. RN to address transportation needs with possible [facility] for medical appointments. Client concerned regarding blood pressure medication. Client given times this week to come into [facility] for a MD live visit for possible hypertension treatment while connecting to [facility] for Primary Care. Will remind client of paperwork needed to apply for [facility] and that Reva Bores is not a primary care provider.   3). Mental Health; client agreeable to pursue mental health treatment at [facility] if transportation assistance can be provided. RN checking on transportation assistance.  4).Medicaid Application: Called with client to Yehuda Savannah financial counselor at [place] states application is still pending. She verified status last on 06/05/17. Application originally sent on 04/24/17 and with 90 day turn around, client should have notification no later than 07/23/17. Will plan to  follow up again later this month. Client aware of status.  5).Transportation needs for medical and mental health appointments: discussed with client that RN will seek assistance through possible [facility]. Client currently has unreliable transportation assistance.  6).Disability application filing status: Client states he will call Social security office to follow up with application.  7). Clothing need expressed: Client states lost his clothing in foreclosure. Referred client to [employer] for a Ambulance person. Days and times to apply given to client. Will follow up with client regarding voucher.  8). Lost driver's license and social security card as well as need for birth certificate: RN assisted client to request duplicate driver's license online: application completed and client should be mailed a duplicate license that he can use to obtain a duplicate Social Security Card and his birth certificate so he can complete housing application and also screening to become a patient at the [facility] for primary medical care. : copy of renewal approval code date and completed payment along with contact information of [place] DMV given to client.  9). Specialist care: client has a follow up appointment scheduled  for 06/2017 with Dr Aline Brochure and client also reminded of his upcoming dental appointment scheduled through [place] for 06/25/17 at [facility] at 1 pm. Client instructed to call and cancel if he is unable to go .client states understanding. Appointment written down for client.   Client also states he has a small ? Transport wheelchair he can use but he is looking for foot pedals for it. RN will inquire with local equipment closets for possible pedals.   Reviewed plans with client and he is agreeable. Client to come to [facility] this week if possible on Wednesday 06/20/17 or 06/21/17 for MD live visit for his hypertension while awaiting  possible primary care placement with [facility].  Mayra Reel RN Care Connect RN Case Manager  [phone]

## 2017-06-20 ENCOUNTER — Telehealth: Payer: Self-pay

## 2017-06-20 NOTE — Telephone Encounter (Signed)
Called client to verify [facility] scheduled for 06/26/17 at 1:30 pm. Pickup time is 12:30.  RN also verified with client that his prescription was available at [facility] for Metoprolol tartrate 50 mg one by mouth two times a day.   Client has no further questions at this time.  Debria Garret RN [phone]

## 2017-06-20 NOTE — Congregational Nurse Program (Unsigned)
Congregational Nurse Program Note  Date of Encounter: 06/20/2017  Past Medical History: Past Medical History:  Diagnosis Date  . Abdominal pain   . Adenomatous colon polyp 2008  . Arthritis   . Back pain   . Bilateral lumbar radiculopathy   . Blurred vision, bilateral   . Chronic back pain   . Constipation   . Depression   . GERD (gastroesophageal reflux disease)   . Gout   . Hemorrhoids   . High cholesterol   . Hypertension   . Neck pain   . Neuropathy of both feet   . Numbness    rectal  . Numbness of legs   . Stroke ([place])   . Substance abuse ([place])    h/o excessive alcohol use; pt says he limits use to one to 2 beers daily he currently    Encounter Details: CNP Questionnaire - 06/20/17 1503      Questionnaire   Patient Status  Not Applicable    Race  Black or African American    Location Patient Served At  MD Live - [employer]  Not Applicable    Uninsured  Uninsured (Subsequent visits/quarter)    Food  No food insecurities    Housing/Utilities  Yes, have permanent housing    Transportation  Yes, need transportation assistance;Provided transportation assistance (bus pass, taxi voucher, etc.)    Interpersonal Safety  Yes, feel physically and emotionally safe where you currently live    Medication  Yes, have medication insecurities;Provided medication assistance    Medical Provider  No    Referrals  [place] [facility] [place]    ED Visit Averted  Yes    Life-Saving Intervention Made  Not Applicable      Client had been referred from [facility] Case management session on 06/19/17 to [facility] today for evaluation and MD live visit for treatment of hypertension. Client has not been taking his metoprolol for several weeks. Client is currently in the process of getting paperwork together to be screened by [facility] for eligibility for possible Medical access program (MAP) and referral to [facility] for Primary Medical care.    Today, client denies any chest pains or shortness of breath. Denies headaches or new vision changes. No difficulty with speech , no facial drooping. No new weakness of extremities. Client has history of neuropathy of hands and feet and is currently in a right foot walking boot, due to a recent right ankle fracture. Follow up with Dr. Aline Brochure on 06/27/17 for ankle injury. Client currently ambulates with the assistance of a walker.  Today blood pressure 167/99 pulse 112 Left arm normal cuff, sitting Left arm normal cuff, sitting 167/100 pulse 109. Last known dose of metoprolol recorded from Epic notes was Metoprolol tartrate 25 mg by mouth, one tablet twice a day. Client states he does not remember how he was taking it, "I have too many doctors".  Client is agreeable to MD live visit today for evaluation and possible treatment of blood pressure with medication. MD live visit completed with Dr. Charisse Klinefelter. Dr. Higinio Plan stated he was going to order electronically to [facility] Metoprolol Tartrate 50 mg one tablet by mouth two times a day.  ****Note Consultation summary by Dr Higinio Plan states Metoprolol succinate 50 mg one by mouth two times a day and prescription sent to [facility] was for succinate. RN instructed pharmacist to hold medication until verification with Dr. Higinio Plan. Dr Higinio Plan was contacted and  medication ordered corrected to be Metoprolol tartrate 50 mg one by mouth two times a day and new prescription sent to [place] by Dr. Charisse Klinefelter.** Client's profile in MD live now corrected by Dr Higinio Plan to reflect Metoprolol tartrate. Confirmed with [place] that correct prescription medication and dosage was being resent by Dr. Higinio Plan.... Medication assistance voucher faxed to [facility] faxed for a one time assistance for client's metoprolol tartrate.  Discussed with client options for following his blood pressure at [facility] until he can get  an appointment into [facility]. Client is in process of gathering paperwork needed for screening such as ID, Social security card, proof of residency, bank statements and letter of support. Client recently lost driver's license and was assisted yesterday by RN to apply for duplicate so he can get a new Social security card and other documentation needed. Client is agreeable to follow up for blood pressure recheck at [facility] next week on 06/25/17 at 1:30 PM. Rn discussed using [facility] for transportation for that appointment. [facility] called and transportation arranged. Client will be picked up around 12:30 pm on 06/25/17. Will discuss at that time client meeting with Social Work BSW intern for risk assessment and referral to Mental health for treatment of depression. Client previously on prozac and has not taken in several weeks. Denies suicidal or homicidal thoughts but does report depression with health and living situation. Client states he is still consuming alcohol "some". Previously reported consuming alcohol 4 to 5 times per week.  Information given to client regarding today's visit and follow up. RN will call client today to verify prescription and [facility] arranged.  Kim: [facility] [facility] RN Case Manager  [facility]: [phone] [facility] [phone]

## 2017-06-20 NOTE — Telephone Encounter (Signed)
I have called him to advise. He will use Naprosyn, and Tylenol.

## 2017-06-20 NOTE — Telephone Encounter (Signed)
Injury and time since injury do not warrant percocet   Try tylenol 500 mg q6 for 5 days   If that doesn't work call back Monday for alternative

## 2017-06-25 ENCOUNTER — Telehealth: Payer: Self-pay

## 2017-06-25 NOTE — Telephone Encounter (Signed)
Follow up from [facility] visit.  Called client to verify that he picked up his metoprolol from [place] and has been taking it. Client confirms he has the medication and reports no problems with dizziness or weakness. RN also reminded client that he is scheduled for a follow up blood pressure recheck tomorrow 06/26/17 at 1:30 pm at [facility] and that [place] is scheduled to pick him up. Client verbalizes understanding.  Client reports he missed his dental appointment today at the [facility]. Client states his ride could not take him due to work. Client states he called to attempt to reschedule with "coordinator" .  Will follow up with client 06/26/17 at appointment at [facility]. No further needs at this time.

## 2017-06-26 DIAGNOSIS — Z139 Encounter for screening, unspecified: Secondary | ICD-10-CM

## 2017-06-26 LAB — GLUCOSE, POCT (MANUAL RESULT ENTRY): POC GLUCOSE: 106 mg/dL — AB (ref 70–99)

## 2017-06-26 NOTE — Congregational Nurse Program (Signed)
Congregational Nurse Program Note  Date of Encounter: 06/26/2017  Past Medical History: Past Medical History:  Diagnosis Date  . Abdominal pain   . Adenomatous colon polyp 2008  . Arthritis   . Back pain   . Bilateral lumbar radiculopathy   . Blurred vision, bilateral   . Chronic back pain   . Constipation   . Depression   . GERD (gastroesophageal reflux disease)   . Gout   . Hemorrhoids   . High cholesterol   . Hypertension   . Neck pain   . Neuropathy of both feet   . Numbness    rectal  . Numbness of legs   . Stroke ([place])   . Substance abuse ([place])    h/o excessive alcohol use; pt says he limits use to one to 2 beers daily he currently    Encounter Details: CNP Questionnaire - 06/26/17 1500      Questionnaire   Patient Status  Not Applicable    Race  Black or African American    Location Patient Served At  [facility]  Not Applicable    Uninsured  Uninsured (Subsequent visits/quarter)    Food  No food insecurities    Housing/Utilities  Yes, have permanent housing    Transportation  Yes, need transportation assistance;Provided transportation assistance (bus pass, taxi voucher, etc.);Within past 12 months, lack of transportation negatively impacted life    Interpersonal Safety  Yes, feel physically and emotionally safe where you currently live    Medication  No medication insecurities    Medical Provider  No    Referrals  [place] [facility] manager;Other    ED Visit Averted  Not Applicable    Life-Saving Intervention Made  Not Applicable       Client in for blood pressure recheck. Was last in [place] 06/20/17 He had been without his metoprolol 25mg  for weeks.  06/20/17 left arm 167/99 pulse 112 06/20/17 right arm 167/100 pulse 109 MD live visit with Dr. Charisse Klinefelter and metoprolol tartrate ordered 50 mg one by mouth two times daily ordered and client states he has been taking them since 06/21/17. No complaints of chest pains or  shortness of breath, no headaches, no dizziness.  Today 06/26/17 lett arm 150/91 pulse 85 06/26/17 right arm 150/94 pulse 86 Counseled client on monitoring salt intake. States his sister cooks for him.  Care Management follow up for [facility]: Client has not received new ID driver's license yet in the mail. Will notify RN when he does.  Client states he missed his care connect dental appointment due person at last minute could not drive him. RN called and [facility] and they state to coordinator for Care connect would have to reschedule. Lynelle Smoke at Care [facility] called and she will attempt to reschedule client.  Plan: Follow up with Dr Aline Brochure 06/27/17 for right ankle fracture. Client reminded to cancel if he cannot get transportation.  Referred client to [place] intern for risk assessment and referral back to [facility]. Client scheduled for 07/04/17 at 0800 and [facility] arranged to provide reliable transportation. Pickup time 0700. Client aware.  Client has yet to get to the [employer] as we discussed for clothing voucher but plans on going soon.  Reminded to gather needed paperwork for new Care connect screening for [facility]. List of needed items given.   Housing and other needs awaiting new driver's license and social security  card replacement and then birth certificate.  Will continue to follow with client weekly by phone and in person as needed. Client has RN contact information.  Debria Garret RN  [phone] [facility] / RN case manager [facility]

## 2017-06-27 ENCOUNTER — Encounter: Payer: Self-pay | Admitting: Orthopedic Surgery

## 2017-06-27 ENCOUNTER — Ambulatory Visit (INDEPENDENT_AMBULATORY_CARE_PROVIDER_SITE_OTHER): Payer: Self-pay

## 2017-06-27 ENCOUNTER — Ambulatory Visit: Payer: Self-pay | Admitting: Orthopedic Surgery

## 2017-06-27 VITALS — BP 131/84 | HR 78 | Ht 74.0 in | Wt 168.0 lb

## 2017-06-27 DIAGNOSIS — S8264XA Nondisplaced fracture of lateral malleolus of right fibula, initial encounter for closed fracture: Secondary | ICD-10-CM

## 2017-06-27 MED ORDER — OXYCODONE-ACETAMINOPHEN 5-325 MG PO TABS: 1.0000 | ORAL_TABLET | Freq: Three times a day (TID) | ORAL | 0 refills | Status: DC | PRN

## 2017-06-27 NOTE — Patient Instructions (Signed)
He will make a new appointment for his knee when he gets his Medicaid card  What You Need to Know About Prescription Opioid Pain Medicine        Please be advised. You are on a medication which is classified as an "opiod". The CDC the [facility]  has recently advised all providers to advise patient's that these medications have certain risks which include but are not limited to:    drug intolerance  drug addiction  respiratory depression   respiratory failure  Death  Please keep these medications locked away. If you feel that you are becoming addicted to these medicines or you are having difficulties with these medications please alert your provider.   As your provider I will attempt to wean you off of these medications when you're severe acute pain has been taking care of. However, if we cannot wean you off of this medication you will be sent to a pain management center where they can better manage chronic pain   Opioids are powerful medicines that are used to treat moderate to severe pain. Opioids should be taken with the supervision of a trained health care provider. They should be taken for the shortest period of time as possible. This is because opioids can be addictive and the longer you take opioids, the greater your risk of addiction (opioid use disorder). What do opioids do? Opioids help reduce or eliminate pain. When used for short periods of time, they can help you:  Sleep better.  Do better in physical or occupational therapy.  Feel better in the first few days after an injury.  Recover from surgery. What kind of problems can opioids cause? Opioids can cause side effects, such as:  Constipation.  Nausea.  Vomiting.  Drowsiness.  Confusion.  Opioid use disorder.  Breathing difficulties (respiratory depression). Using opioid pain medicines for longer than 3 days increases your risk of these side effects. Taking opioid pain medicine  for a long period of time can affect your ability to do daily tasks. It also puts you at risk for:  Car accidents.  Heart attack.  Overdose, which can sometimes lead to death. What can increase my risk for developing problems while taking opioids? You may be at an especially high risk for problems while taking opioids if you:  Are over the age of 49.  Are pregnant.  Have kidney or liver disease.  Have certain mental health conditions, such as depression or anxiety.  Have a history of substance use disorder.  Have had an opioid overdose in the past. How do I stop taking opioids if I have been taking them for a long time? If you have been taking opioid medicine for more than a few weeks, you may need to slowly stop taking them (taper). Tapering your use of opioids can decrease your chances of experiencing withdrawal symptoms, such as:  Abdominal pain and cramping.  Nausea.  Sweating.  Sleepiness.  Restlessness.  Uncontrollable shaking (tremors).  Cravings for the medicine. Do not attempt to taper your use of opioids on your own. Talk with your health care provider about how to do this. Your health care provider may prescribe a step-down schedule based on how much medicine you are taking and how long you have been taking it. What are the benefits of stopping the use of opioids? By switching from opioid pain medicine to non-opioid pain management options, you will decrease your risk of accidents and injuries associated with long-term opioid use.  You will also be able to:  Monitor your pain more accurately and know when to seek medical care if it is not improving.  Decrease risk to others around you. Having opioids in the home increases the risk for accidental or intentional use or overdose by others. How can I treat pain without opioids? Pain can be managed with many types of alternative treatments. Ask your health care provider to refer you to one or more specialists who can  help you manage pain through:  Physical or occupational therapy.  Counseling (cognitive-behavioral therapy).  Good nutrition.  Biofeedback.  Massage.  Meditation.  Non-opioid medicine.  Following a gentle exercise program. Where can I get support? If you have been taking opioids for a long time, you may benefit from receiving support for quitting from a local support group or counselor. Ask your health care provider for a referral to these resources in your area. When should I seek medical care? Seek medical care right away if you are taking opioids and you experience any of the following:  Difficulty breathing.  Breathing that is more shallow or slower than normal.  A very slow heartbeat (pulse).  Severe confusion.  Unconsciousness.  Sleepiness.  Difficulty waking from sleep.  Slurred speech.  Nausea and vomiting.  Cold, clammy skin.  Blue lips or fingernails.  Limpness.  Abnormally small pupils. If you think that you or someone else may have taken too much of an opioid medicine, get medical help right away. Do not wait to see if the symptoms go away on their own. Call your local emergency services (911 in the [place].), or call the hotline of the [facility] 6460173780 in the [place].).  Where can I get more information? To learn more about opioid medicines, visit the Centers for Disease Control and Prevention web site Opioid Basics at https://keller-santana.com/. Summary  Opioid medicines can help you manage moderate-to-severe pain for a short period of time.  Taking opioid pain medicine for a long period of time puts you at risk for unintentional accidents, injury, and even death.  If you think that you or someone else may have taken too much of an opioid, get medical help right away. This information is not intended to replace advice given to you by your health care provider. Make sure you discuss any questions you  have with your health care provider. Document Released: 03/26/2015 Document Revised: 10/22/2015 Document Reviewed: 10/09/2014 Elsevier Interactive Patient Education  2017 [employer].

## 2017-06-27 NOTE — Progress Notes (Signed)
Fracture care follow-up  Chief Complaint  Patient presents with  . Ankle Injury    fracture right ankle 05/01/17 patient states it feels much better,would like refill of Oxycodone    Encounter Diagnosis  Name Primary?  . Closed nondisplaced fracture of lateral malleolus of right fibula, initial encounter Yes    He says his ankle feels good his knee is hurting    Current Outpatient Medications:  .  aspirin EC 81 MG tablet, Take 81 mg by mouth daily. , Disp: , Rfl:  .  cholecalciferol (VITAMIN D) 1000 units tablet, Take 2,000 Units by mouth daily. , Disp: , Rfl:  .  cyclobenzaprine (FLEXERIL) 5 MG tablet, Take 1 tablet (5 mg total) by mouth 3 (three) times daily as needed for muscle spasms., Disp: 15 tablet, Rfl: 0 .  docusate sodium (COLACE) 100 MG capsule, Take 100 mg by mouth 3 (three) times daily as needed for mild constipation., Disp: , Rfl:  .  folic acid (FOLVITE) 242 MCG tablet, Take 400 mcg by mouth daily., Disp: , Rfl:  .  metoprolol tartrate (LOPRESSOR) 50 MG tablet, Take 50 mg by mouth 2 (two) times daily., Disp: , Rfl:  .  Multiple Vitamin (MULTIVITAMIN WITH MINERALS) TABS tablet, Take 1 tablet by mouth daily., Disp: , Rfl:  .  pyridoxine (B-6) 100 MG tablet, Take 100 mg by mouth daily., Disp: , Rfl:  .  thiamine 100 MG tablet, Take 1 tablet (100 mg total) by mouth daily., Disp: , Rfl:  .  Turmeric 500 MG TABS, Take 1 tablet by mouth daily., Disp: , Rfl:  .  vitamin B-12 (CYANOCOBALAMIN) 1000 MCG tablet, Take 1,000 mcg by mouth daily., Disp: , Rfl:  .  vitamin E 400 UNIT capsule, Take 400 Units by mouth daily., Disp: , Rfl:  .  FLUoxetine (PROZAC) 20 MG tablet, Take 1 tablet (20 mg total) by mouth daily. (Patient not taking: Reported on 06/19/2017), Disp: 30 tablet, Rfl: 0 .  meclizine (ANTIVERT) 25 MG tablet, Take 1 tablet (25 mg total) by mouth 3 (three) times daily as needed for dizziness. (Patient not taking: Reported on 06/19/2017), Disp: 21 tablet, Rfl: 0 .  metoprolol  tartrate (LOPRESSOR) 25 MG tablet, Take 1 tablet (25 mg total) by mouth 2 (two) times daily. (Patient taking differently: Take 25 mg by mouth once. ), Disp: 60 tablet, Rfl: 0 .  naproxen (NAPROSYN) 500 MG tablet, Take 1 tablet (500 mg total) by mouth 2 (two) times daily as needed. (Patient not taking: Reported on 06/27/2017), Disp: 60 tablet, Rfl: 0 .  oxyCODONE-acetaminophen (PERCOCET/ROXICET) 5-325 MG tablet, Take 1 tablet by mouth every 8 (eight) hours as needed for severe pain. (Patient not taking: Reported on 06/27/2017), Disp: 21 tablet, Rfl: 0  BP 131/84   Pulse 78   Ht 6\' 2"  (1.88 m)   Wt 168 lb (76.2 kg)   BMI 21.57 kg/m   Physical Exam  Musculoskeletal:       Feet:     Xrays: Ordered x-ray see report x-ray report will show or was read by me as healed fibular fracture ankle mortise intact recommend removing the boot weight-bear as tolerated  Patient will schedule another appointment when he gets his Medicaid card for his right knee arthritis    Plan   Opioid warnings given prescription refill

## 2017-07-03 ENCOUNTER — Telehealth: Payer: Self-pay

## 2017-07-03 NOTE — Telephone Encounter (Signed)
Called to follow up with client regarding recent ortho appointment and to inquire if he had received his duplicate ID yet. No answer, message left to please return call.

## 2017-07-03 NOTE — Telephone Encounter (Signed)
Client returned call for [facility] Care management.  Client reports duplicate driver's license was received in the mail and he has obtained his birth certificate and is awaiting his duplicate social security card by the end of the week hopefully. Client states his last ortho appointment went well and he is no longer in the walking boot. Client still reports pain in his knees and he plans on returning to Dr. Aline Brochure once his medicaid application is accepted. He states he should hear from [facility] regarding decision next month in May.  RN informed client that his dental appointment through care connect has been rescheduled for 0619/19 at 0915 [facility]. [facility] has been scheduled for Transportation and will pickup around 0815 on that day.  RN reminded client of his appointment and [facility] transport for tomorrow morning 07/03/17 at 0800 [facility] pickup 0700.  RN discussed with client to obtain his [place] statements, a letter of support from his sister this week and we will schedule a MAP(medical access screening) with [facility] financial/eligibilty screener Lynelle Smoke. Client states understanding and will notify RN of when he has materials available, possibly this week.  Plan: Establish Primary Care appointment / provider after [facility] Screening with [facility]. Apply for housing assistance Await Medicaid approval or denial as well as disability decsion.  Will follow up with Client weekly and as needed.   [place] [facility] RN Case Manager [phone]

## 2017-07-10 ENCOUNTER — Telehealth: Payer: Self-pay

## 2017-07-10 NOTE — Telephone Encounter (Signed)
[facility] follow up.   Client called with pickup times from [facility] for his 07/21/17 appointment to [facility] [place] pickup time will be 0820 approx.  Appointment at [facility] on 07/28/17 at 10:00 pickup time will be 0900.  Will follow as needed. Client aware to call RN when he has his letter of support from his sister as well as bank statements so we can make an appointment for enrollment and MAP screening with [facility] enrollment person in order to refer client into [facility] clinic.  Client states he will call.  Debria Garret RN [phone]

## 2017-07-10 NOTE — Telephone Encounter (Signed)
Client called RN to follow up. Client reports he has obtained his new ID, Birth certificate as well as social security card. Plan now is for completion of his letter of support as well as his bank statements in order to screen and enroll with Care connect and Medical Access Program for referral to [facility] for his primary medical care. Client is agreeable and will notify RN when he has the letter and statements.  RN followed up with client with his recent appointment at [facility] on 07/04/17 for intake and to establish care. Client states appointment went well and he has two upcoming appointments that he needs help with transportation for. [facility] on 07/21/17 at 920 am and 07/28/17 to [facility] at 10:00am  RN will call and schedule [facility] transportation for client and will call back with pickup times for those dates. Client agreeable.   Client reports he is taking his metoprolol as ordered and has been walking more since his orthopedic boot was removed and his fracture has healed. He does state that he is out of some of his vitamins, but states some of those are OTC and he is trying to come up with funds to purchase.  Will continue to follow as needed. Next goals from Plan: Establish a primary care provider through enrollment with [facility]. [facility]  Apply for housing assistance with [employer] since he now has his documents.

## 2017-07-13 ENCOUNTER — Telehealth: Payer: Self-pay

## 2017-07-13 NOTE — Telephone Encounter (Signed)
[facility] follow up case management.  RN called client regarding previous appointments and need for [facility] for upcoming appointments with [facility].  Dates given to client are for Saturdays. RN called client to ask him to have those dates confirmed so I can correct those with [facility].  Client confirmed he had his appointments wrong:  They are for 07/17/17 (rescheduled) at 1020am [facility] arranged pickup will be approx. 1517. 07/18/17 appointment instead of previous 07/28/17 for 10 am changed with [facility] and pickup will be 0900.  Client aware of dates and pickup times as well as Confirmed correct dates with [facility].  Client reports he should have his letter of support and bank statements and other needed things to screen for enrollment and referral to [facility]. Client prefers coming in for that appointment next week. RN will schedule and notify client of that date and time. Client agreeable.  Debria Garret RN [facility] Case Manager

## 2017-07-18 ENCOUNTER — Telehealth: Payer: Self-pay

## 2017-07-18 NOTE — Telephone Encounter (Signed)
Client called to follow up with RN that he has all the things needed to screen with enrollment to be screened for the Medical access program and referral into [facility] clinic. RN made client a screening appointment with Lynelle Smoke of [facility] for 07/19/17 at 10 am. Client states he will be at appointment and is able to get transportation there. Location [address]  [place] .  Will follow up as needed with client.  Debria Garret RN  [facility] Case Management  [phone]

## 2017-07-19 ENCOUNTER — Telehealth: Payer: Self-pay

## 2017-07-19 NOTE — Telephone Encounter (Signed)
Called client regarding his screening appointment today for [facility] and Medical access program. Client enrolled and referred into [facility] and appointment acquired for 08/07/17 at 0730.  [facility] called and transportation arranged for appointment . Pickup time will be 0645am. Client aware.   Will continue to follow and next goal discussed with client to apply for housing. RN will notify client and make an appointment for a time to complete that application. Client agreeable  [facility] Case Manager  [phone]

## 2017-07-27 ENCOUNTER — Telehealth: Payer: Self-pay

## 2017-07-27 NOTE — Telephone Encounter (Signed)
Called to follow up with client. Arranged appointment to apply for housing. Appointment scheduled for Aug 01, 2017 at 10 am at [facility] to meet client's schedule. [facility] arranged and pickup will be at 0900. Client aware. We will also recheck his blood pressure at that time and review all client's upcoming appointments.  [place] [facility] [phone]

## 2017-08-01 NOTE — Congregational Nurse Program (Signed)
Congregational Nurse Program Note  Date of Encounter: 08/01/2017  Past Medical History: Past Medical History:  Diagnosis Date  . Abdominal pain   . Adenomatous colon polyp 2008  . Arthritis   . Back pain   . Bilateral lumbar radiculopathy   . Blurred vision, bilateral   . Chronic back pain   . Constipation   . Depression   . GERD (gastroesophageal reflux disease)   . Gout   . Hemorrhoids   . High cholesterol   . Hypertension   . Neck pain   . Neuropathy of both feet   . Numbness    rectal  . Numbness of legs   . Stroke ([place])   . Substance abuse ([place])    h/o excessive alcohol use; pt says he limits use to one to 2 beers daily he currently   Client seen at [facility] today for blood pressure recheck while awaiting his primary care appointment at [facility]. His appointment was changed yesterday from 08/07/17 until 09/18/17. Client did inform Carin Primrose that his blood pressure medication was about to run out in 8 days. RN spoke with Carin Primrose. Client when screened for [facility] was approved also for [facility] until March 2020. Carin Primrose faxed prescriptions to med assist for client's medicine to be mailed to his mailing address. RN discussed with client if he gets down to 3 pills and has no medicine in the mail yet to call RN. Client states he will do so.  Today Blood pressure 136/86 pulse 76 . Client currently taking Metoprolol tartrate 50 mg one tablet twice daily as prescribed by Dr Higinio Plan of MD live visit at [facility].  Client is ambulating using walker and states he is becoming stronger. He does complain of arthritis pain in his knees. He reports he has not yet heard from DSS regarding his medicaid application.   As [facility] follow up. RN assisted client in checking on housing application he had applied for last January 2018. Client was placed into the system with his first name as his last name. RN contacted housing authority for issue to be corrected.  Helene Kelp at housing will correct and she states she should have all information she needs to get him on the list for housing. She does state there could be a wait.  RN also gave client a list of possible other income based housing units that he can call to inquire about housing.  RN assisted client in calling his therapist at [facility] to arrange an appointment. appt 06/03/  19 at 0900, [facility] arranged for 0800 pickup. RN also reschedule client's [facility] for [facility] appoinment on 09/18/17 at 0800 pickup will be 0700.  Will continue to follow as needed.  [place] [facility] program Encounter Details CNP Questionnaire - 08/01/17 1000      Questionnaire   Patient Status  Not Applicable    Race  Black or African American    Location Patient Served At  [facility]  Not Applicable    Uninsured  Uninsured (Subsequent visits/quarter)    Food  No food insecurities    Housing/Utilities  Yes, have permanent housing;Worried about losing housing    Transportation  Yes, need transportation assistance;Provided transportation assistance (bus pass, taxi voucher, etc.)    Interpersonal Safety  Yes, feel physically and emotionally safe where you currently live    Medication  Yes, have medication insecurities  Medical Provider  Yes    Referrals  Area Agency;[facility];Behavioral/Mental Health Provider    ED Visit Averted  Not Applicable    Life-Saving Intervention Made  Not Applicable

## 2017-08-13 ENCOUNTER — Telehealth: Payer: Self-pay

## 2017-08-13 NOTE — Telephone Encounter (Addendum)
Returned call to client . Client states his appointment with [facility] for today was cancelled by [facility] and rescheduled for June 14 at 0900. He will need [facility] for that appointment.  RN will call [facility] to arrange then confirm with client.  Client is being followed by [facility] and also as a care management client of [facility].   Debria Garret RN [phone]

## 2017-09-26 ENCOUNTER — Emergency Department (HOSPITAL_COMMUNITY): Payer: Medicaid Other

## 2017-09-26 ENCOUNTER — Other Ambulatory Visit: Payer: Self-pay

## 2017-09-26 ENCOUNTER — Encounter (HOSPITAL_COMMUNITY): Payer: Self-pay | Admitting: *Deleted

## 2017-09-26 ENCOUNTER — Emergency Department (HOSPITAL_COMMUNITY)
Admission: EM | Admit: 2017-09-26 | Discharge: 2017-09-26 | Disposition: A | Discharge status: Home / Self Care | Payer: Medicaid Other | Attending: Emergency Medicine | Admitting: Emergency Medicine

## 2017-09-26 DIAGNOSIS — M25531 Pain in right wrist: Secondary | ICD-10-CM | POA: Insufficient documentation

## 2017-09-26 DIAGNOSIS — I10 Essential (primary) hypertension: Secondary | ICD-10-CM | POA: Insufficient documentation

## 2017-09-26 DIAGNOSIS — M25532 Pain in left wrist: Secondary | ICD-10-CM | POA: Diagnosis not present

## 2017-09-26 DIAGNOSIS — Z7982 Long term (current) use of aspirin: Secondary | ICD-10-CM | POA: Diagnosis not present

## 2017-09-26 DIAGNOSIS — Z8673 Personal history of transient ischemic attack (TIA), and cerebral infarction without residual deficits: Secondary | ICD-10-CM | POA: Diagnosis not present

## 2017-09-26 DIAGNOSIS — F17290 Nicotine dependence, other tobacco product, uncomplicated: Secondary | ICD-10-CM | POA: Insufficient documentation

## 2017-09-26 MED ORDER — NAPROXEN 500 MG PO TABS: 500.0000 mg | ORAL_TABLET | Freq: Two times a day (BID) | ORAL | 0 refills | Status: DC

## 2017-09-26 MED ORDER — NAPROXEN 250 MG PO TABS
500.0000 mg | ORAL_TABLET | Freq: Once | ORAL | Status: AC
  Administered 2017-09-26: 500 mg via ORAL
  Filled 2017-09-26: qty 2

## 2017-09-26 NOTE — ED Triage Notes (Signed)
Pt c/o bilateral wrist pain that radiates down into his hands and up into both arms

## 2017-09-26 NOTE — ED Notes (Signed)
Patient transported to X-ray 

## 2017-09-26 NOTE — Discharge Instructions (Addendum)
Your wrist pain is likely from gripping her walker.  Wear the wrist splints as prescribed and follow-up with Dr. Aline Brochure.  Return to the ED if you develop new or worsening symptoms.

## 2017-09-26 NOTE — ED Provider Notes (Signed)
[facility] EMERGENCY DEPARTMENT Provider Note   CSN: [id number] Arrival date & time: 09/26/17  0028     History   Chief Complaint Chief Complaint  Patient presents with  . Wrist Pain    HPI Tristan Cook is a 57 y.o. male.  Patient complains of bilateral wrist pain for the "past year".  States he came tonight because he does not have a ride tomorrow.  Describes pain in his bilateral wrists that radiates to all of his fingers and sometimes up to his elbows.  This pain is constant and worse when he tries to use his walker.  He does use a walker normally due to previous stroke and arthritis issues with weakness in his legs.  Also states he has neuropathy.  He does not take anything at home for his wrist pain.  He denies any falls onto his wrist.  No focal weakness, focal tingling.  Does have tingling in all of his fingers on both hands.  The pain is not worse with palpation or movement.  The history is provided by the patient.  Wrist Pain  Pertinent negatives include no chest pain, no abdominal pain, no headaches and no shortness of breath.    Past Medical History:  Diagnosis Date  . Abdominal pain   . Adenomatous colon polyp 2008  . Arthritis   . Back pain   . Bilateral lumbar radiculopathy   . Blurred vision, bilateral   . Chronic back pain   . Constipation   . Depression   . GERD (gastroesophageal reflux disease)   . Gout   . Hemorrhoids   . High cholesterol   . Hypertension   . Neck pain   . Neuropathy of both feet   . Numbness    rectal  . Numbness of legs   . Stroke ([place])   . Substance abuse ([place])    h/o excessive alcohol use; pt says he limits use to one to 2 beers daily he currently    Patient Active Problem List   Diagnosis Date Noted  . Weakness 04/11/2017  . Weakness of both legs 04/10/2017  . Vitamin B deficiency 01/22/2017  . 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 Stroke, small vessel ([place]) 01/17/2017  . 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 Vitamin B12 deficiency neuropathy ([place]) 01/17/2017  . History of colonic polyps 06/14/2016  . Elevated blood pressure reading 06/14/2016  . Hemorrhoids 09/20/2015  . Depression 08/03/2015  . Back pain with radiation 06/15/2015  . Insomnia 06/03/2015  . Hyperlipidemia LDL goal <130 04/25/2015  . 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 Fatigue 03/29/2015  . Allergic rhinitis 02/10/2015  . Esophageal stricture 10/22/2014  . Overweight (BMI 25.0-29.9) 10/06/2013  . GERD (gastroesophageal reflux disease) 04/23/2012  . Hemorrhoids, internal 11/02/2011  . Essential hypertension 07/25/2007  . Neck pain on left side 07/25/2007    Past Surgical History:  Procedure Laterality Date  . COLONOSCOPY  10/09/2006   3 mm pedunculated sigmoid colon polyp removed/8-mm sessile hepatic flexure polyp (tubular villous adenoma) removed/6-mm  descending colon polyp removed/small internal hemorrhoids  . COLONOSCOPY  02/28/2011   TKZ:SWFUXN, multiple in the rectum/Internal hemorrhoids, MODERATE-CAUSING RECTAL BLEEDING  . COLONOSCOPY N/A 07/05/2016   Procedure: COLONOSCOPY;  Surgeon: [name], MD;  Location: [facility];  Service: Endoscopy;  Laterality: N/A;  11:15am  . 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 Mild erosive gastrtitis most likely due to indomethacin   . FINGER  SURGERY Right    4th and 5th digit  . FLEXIBLE SIGMOIDOSCOPY  01/02/2012   SLF: 1. the colonic mucosa appeared normal in the sigmoid colon 2. Large internal hemorrhoids: cause for rectal bleeding/pain . s/p banding X 3   . HAND SURGERY    . SAVORY DILATION N/A 07/21/2014   Procedure: SAVORY DILATION;  Surgeon: [name], MD;  Location: [facility] END[facility] SU[facility];  Service: Endoscopy;  Laterality: N/A;        Home Medications    Prior to Admission medications   Medication Sig Start Date End Date Taking? Authorizing Provider  aspirin EC 81 MG tablet Take 81 mg by mouth  daily.     [provider]  cholecalciferol (VITAMIN D) 1000 units tablet Take 2,000 Units by mouth daily.     [provider]  cyclobenzaprine (FLEXERIL) 5 MG tablet Take 1 tablet (5 mg total) by mouth 3 (three) times daily as needed for muscle spasms. 06/12/17   Evalee [place], PA-C  docusate sodium (COLACE) 100 MG capsule Take 100 mg by mouth 3 (three) times daily as needed for mild constipation.    [provider]  FLUoxetine (PROZAC) 20 MG tablet Take 1 tablet (20 mg total) by mouth daily. Patient not taking: Reported on 06/19/2017 04/26/17   Soyla Dryer, PA-C  folic acid (FOLVITE) 244 MCG tablet Take 400 mcg by mouth daily.    [provider]  meclizine (ANTIVERT) 25 MG tablet Take 1 tablet (25 mg total) by mouth 3 (three) times daily as needed for dizziness. Patient not taking: Reported on 06/19/2017 04/03/17   Lily Kocher, PA-C  metoprolol tartrate (LOPRESSOR) 25 MG tablet Take 1 tablet (25 mg total) by mouth 2 (two) times daily. Patient taking differently: Take 25 mg by mouth once.  02/14/17 05/15/17  Rolland Porter, MD  metoprolol tartrate (LOPRESSOR) 50 MG tablet Take 50 mg by mouth 2 (two) times daily. 06/22/17   [provider]  Multiple Vitamin (MULTIVITAMIN WITH MINERALS) TABS tablet Take 1 tablet by mouth daily. 04/12/17   Johnson, Clanford L, MD  naproxen (NAPROSYN) 500 MG tablet Take 1 tablet (500 mg total) by mouth 2 (two) times daily as needed. Patient not taking: Reported on 06/27/2017 04/17/17   Soyla Dryer, PA-C  oxyCODONE-acetaminophen (PERCOCET/ROXICET) 5-325 MG tablet Take 1 tablet by mouth every 8 (eight) hours as needed for severe pain. 06/27/17   Carole Civil, MD  pyridoxine (B-6) 100 MG tablet Take 100 mg by mouth daily.    [provider]  thiamine 100 MG tablet Take 1 tablet (100 mg total) by mouth daily. 04/12/17   Johnson, Clanford L, MD  Turmeric 500 MG TABS Take 1 tablet by mouth daily.    [provider]    vitamin B-12 (CYANOCOBALAMIN) 1000 MCG tablet Take 1,000 mcg by mouth daily.    [provider]  vitamin E 400 UNIT capsule Take 400 Units by mouth daily.    [provider]    Family History Family History  Problem Relation Age of Onset  . Hypertension Mother   . Hypertension Father   . Stroke Father   . Hypertension Sister   . Hypertension Brother   . Cancer Brother   . Cancer Brother        throat cancer  . Hypertension Brother   . Diabetes Maternal Grandmother   . Colon cancer Neg Hx     Social History Social History   Tobacco Use  . Smoking status:  Former Smoker    Packs/day: 1.50    Years: 33.00    Pack years: 49.50    Types: Cigarettes    Last attempt to quit: 08/16/2004    Years since quitting: 13.1  . Smokeless tobacco: Current User    Types: Snuff  . Tobacco comment: Dips every once in a while  Substance Use Topics  . Alcohol use: Yes    Alcohol/week: 3.6 oz    Types: 6 Cans of beer per week    Comment: drinks 6 pack of beer on weekends  . Drug use: No     Allergies   Hydrocodone and Benadryl [diphenhydramine hcl (sleep)]   Review of Systems Review of Systems  Constitutional: Negative for activity change, appetite change and fever.  HENT: Negative for congestion and sinus pressure.   Eyes: Negative for visual disturbance.  Respiratory: Negative for cough, chest tightness and shortness of breath.   Cardiovascular: Negative for chest pain.  Gastrointestinal: Negative for abdominal pain, nausea and vomiting.  Genitourinary: Negative for dysuria, hematuria and testicular pain.  Musculoskeletal: Positive for arthralgias and myalgias.  Neurological: Positive for numbness. Negative for dizziness, weakness, light-headedness and headaches.    all other systems are negative except as noted in the HPI and PMH.    Physical Exam Updated Vital Signs BP (!) 138/94 (BP Location: Right Arm)   Pulse 96   Temp 98.2 F (36.8 C) (Oral)   Resp  18   Ht 6\' 2"  (1.88 m)   Wt 77.1 kg (170 lb)   SpO2 98%   BMI 21.83 kg/m   Physical Exam  Constitutional: He is oriented to person, place, and time. He appears well-developed and well-nourished. No distress.  HENT:  Head: Normocephalic and atraumatic.  Mouth/Throat: Oropharynx is clear and moist. No oropharyngeal exudate.  Eyes: Pupils are equal, round, and reactive to light. Conjunctivae and EOM are normal.  Neck: Normal range of motion. Neck supple.  No meningismus.  Cardiovascular: Normal rate, regular rhythm, normal heart sounds and intact distal pulses.  No murmur heard. Pulmonary/Chest: Effort normal and breath sounds normal. No respiratory distress. He exhibits no tenderness.  Abdominal: Soft. There is no tenderness. There is no rebound and no guarding.  Musculoskeletal: Normal range of motion. He exhibits no edema or tenderness.  Bilateral wrists appear normal to inspection.  Intact Radial pulses and grip strengths.  Tinel's sign negative. Phalen's sign negative.  From bilateral shoulders and elbows  Neurological: He is alert and oriented to person, place, and time. No cranial nerve deficit. He exhibits normal muscle tone. Coordination normal.   5/5 strength throughout. CN 2-12 intact.Equal grip strength.   Skin: Skin is warm.  Psychiatric: He has a normal mood and affect. His behavior is normal.  Nursing note and vitals reviewed.    ED Treatments / Results  Labs (all labs ordered are listed, but only abnormal results are displayed) Labs Reviewed - No data to display  EKG None  Radiology Dg Wrist Complete Left  Result Date: 09/26/2017 CLINICAL DATA:  57 year old male with left wrist pain. EXAM: LEFT WRIST - COMPLETE 3+ VIEW COMPARISON:  None. FINDINGS: There is no acute fracture or dislocation. The bones are osteopenic. The soft tissues appear unremarkable. IMPRESSION: 1. No acute fracture or dislocation. 2. Osteopenia. Electronically Signed   By: Anner Crete  M.D.   On: 09/26/2017 01:48   Dg Wrist Complete Right  Result Date: 09/26/2017 CLINICAL DATA:  57 year old male with right wrist pain. EXAM: RIGHT WRIST -  COMPLETE 3+ VIEW COMPARISON:  Left wrist radiograph dated 09/26/2017 FINDINGS: There is no acute fracture or dislocation. Osteopenia. The soft tissues appear unremarkable. IMPRESSION: No acute fracture or dislocation. Osteopenia. Electronically Signed   By: Anner Crete M.D.   On: 09/26/2017 01:49    Procedures Procedures (including critical care time)  Medications Ordered in ED Medications  naproxen (NAPROSYN) tablet 500 mg (500 mg Oral Given 09/26/17 0109)     Initial Impression / Assessment and Plan / ED Course  I have reviewed the triage vital signs and the nursing notes.  Pertinent labs & imaging results that were available during my care of the patient were reviewed by me and considered in my medical decision making (see chart for details).    Bilateral wrist pain without acute trauma.  This is been ongoing for the past 1 year.  Patient is negative x-rays of his wrists.  He has intact pulses, strength and sensation.  There is no evidence of infection.  Suspect his wrist pain is due to his using of his walker.  There are no neurological deficits.  He is given wrist splints and anti-inflammatories and follow-up with orthopedics.  Return precautions discussed.  Final Clinical Impressions(s) / ED Diagnoses   Final diagnoses:  Bilateral wrist pain    ED Discharge Orders    None       Almira Phetteplace, Annie Main, MD 09/27/17 [phone]

## 2017-10-17 ENCOUNTER — Encounter: Payer: Self-pay | Admitting: Nurse Practitioner

## 2017-10-17 ENCOUNTER — Ambulatory Visit (INDEPENDENT_AMBULATORY_CARE_PROVIDER_SITE_OTHER): Payer: Medicaid Other | Admitting: Nurse Practitioner

## 2017-10-17 DIAGNOSIS — K5900 Constipation, unspecified: Secondary | ICD-10-CM | POA: Diagnosis not present

## 2017-10-17 DIAGNOSIS — R490 Dysphonia: Secondary | ICD-10-CM | POA: Insufficient documentation

## 2017-10-17 MED ORDER — OMEPRAZOLE 20 MG PO CPDR: 20.0000 mg | DELAYED_RELEASE_CAPSULE | Freq: Every day | ORAL | 2 refills | Status: DC

## 2017-10-17 NOTE — Assessment & Plan Note (Signed)
The patient complains of hoarseness.  He denies typical GERD symptoms including esophageal burning, throat burning, bitter sour regurgitation, epigastric pain.  He could have silent reflux contributing to his hoarseness.  He also occasionally does dip, although he is trying to cut back.  At this point I will start him on Prilosec 20 mg daily as evaluation/treatment of possible silent GERD.  We will have him follow-up in 2 months.  If his hoarseness symptoms are persistent we can then refer him to ENT for further evaluation.

## 2017-10-17 NOTE — Patient Instructions (Signed)
1. I have sent a prescription for omeprazole 20 mg to [facility].  Take this once a day, on an empty stomach. 2. Start taking fiber supplement 1-2 times a day.  You can explore the fiber I will in the pharmacy to find a fiber option that you are agreeable with. 3. Return for follow-up in 2 months. 4. Call us if you have any questions or concerns.  At [facility] we value your feedback. You may receive a survey about your visit today. Please share your experience as we strive to create trusting relationships with our patients to provide genuine, compassionate, quality care.  It was great to meet you today!  I hope you have a great summer!!

## 2017-10-17 NOTE — Progress Notes (Signed)
No pcp per patient 

## 2017-10-17 NOTE — Assessment & Plan Note (Signed)
The patient is somewhat atypical constipation symptoms.  His stools are consistent with [place] 5-6.  However, he does have straining and incomplete emptying.  He is on Colace daily.  I will start him on fiber to see if this helps him have more complete bowel movements.  Follow-up in 3 months.

## 2017-10-17 NOTE — Progress Notes (Signed)
Referring Provider: No ref. provider found Primary Care Physician:  Patient, No Pcp Per Primary GI:  Dr. Oneida Alar  Chief Complaint  Patient presents with  . change in bowels    BM's once a day, sometimes he does strain, has a lot of gas with BM  . Hoarse    HPI:   Tristan Cook is a 57 y.o. male who presents for evaluation of abdominal pain and change in bowel movements.  Patient was last seen in our office 06/14/2016 for hemorrhoids, history of colon polyps, elevated blood pressure.  Noted history of GERD, peptic stricture with dysphasia.  Hemorrhoid banding with flexible sigmoidoscopy completed 01/02/2012.  At his last visit he was overdue for repeat colonoscopy with a history of multiple rectal polyps due to advanced polyp.  He was recently diagnosed with peripheral neuropathy.  At his last visit he stated Neurontin was not helping his neuropathy pain, PPI effective but will have nausea and vomiting if he misses a dose.  No hematochezia or melena.  Developed partial rectal numbness when he was diagnosed with neuropathy.  Denies incontinence or inability to sense need for defecation.  No other GI symptoms.  Recommended schedule updated colonoscopy.  Follow-up based on post procedure recommendations.  Colonoscopy completed 07/05/2016 which found redundant colon, single 5 mm colon polyp.  Surgical pathology found the polyp to be benign colon mucosa with lymphoid aggregate.  Recommended high-fiber diet, continue current medications, repeat colonoscopy in 5 years (2023).  Today he states he's doing ok overall. His bowel movements have been soft ([place] 5-6) but has straining and incomplete emptying. Stool are chronically that soft (not new). A lot of flatulence with that as well. Denies abdominal pain, N/V. Has chronic hoarseness but denies GERD symptoms. No seasonal allergies that he's aware of. Hoarseness started last week, some tissue tenderness but no odynophagia. Denies cough, phlegm  production, swelling of that area. Denies hematochezia, melena, fever, chills, unintentional weight loss. Denies chest pain, dyspnea, dizziness, lightheadedness, syncope, near syncope. Denies any other upper or lower GI symptoms.  Is under a lot of stress recently. He does dip tobacco.  Past Medical History:  Diagnosis Date  . Abdominal pain   . Adenomatous colon polyp 2008  . Arthritis   . Back pain   . Bilateral lumbar radiculopathy   . Blurred vision, bilateral   . Chronic back pain   . Constipation   . Depression   . GERD (gastroesophageal reflux disease)   . Gout   . Hemorrhoids   . High cholesterol   . Hypertension   . Neck pain   . Neuropathy of both feet   . Numbness    rectal  . Numbness of legs   . Stroke ([place])   . Substance abuse ([place])    h/o excessive alcohol use; pt says he limits use to one to 2 beers daily he currently    Past Surgical History:  Procedure Laterality Date  . COLONOSCOPY  10/09/2006   3 mm pedunculated sigmoid colon polyp removed/8-mm sessile hepatic flexure polyp (tubular villous adenoma) removed/6-mm  descending colon polyp removed/small internal hemorrhoids  . COLONOSCOPY  02/28/2011   [id number]:[id number], multiple in the rectum/Internal hemorrhoids, MODERATE-CAUSING RECTAL BLEEDING  . COLONOSCOPY N/A 07/05/2016   Procedure: COLONOSCOPY;  Surgeon: [name], MD;  Location: [facility];  Service: Endoscopy;  Laterality: N/A;  11:15am  . 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 Mild erosive gastrtitis most  likely due to indomethacin   . FINGER SURGERY Right    4th and 5th digit  . FLEXIBLE SIGMOIDOSCOPY  01/02/2012   SLF: 1. the colonic mucosa appeared normal in the sigmoid colon 2. Large internal hemorrhoids: cause for rectal bleeding/pain . s/p banding X 3   . HAND SURGERY    . SAVORY DILATION N/A 07/21/2014   Procedure: SAVORY DILATION;  Surgeon: [name], MD;  Location: [facility];  Service: Endoscopy;  Laterality: N/A;    Current Outpatient Medications  Medication Sig Dispense Refill  . aspirin EC 81 MG tablet Take 81 mg by mouth daily.     . cholecalciferol (VITAMIN D) 1000 units tablet Take 2,000 Units by mouth daily.     [name] docusate sodium (COLACE) 100 MG capsule Take 100 mg by mouth 3 (three) times daily as needed for mild constipation.    [name] FLUoxetine (PROZAC) 20 MG tablet Take 1 tablet (20 mg total) by mouth daily. 30 tablet 0  . folic acid (FOLVITE) 712 MCG tablet Take 400 mcg by mouth daily.    . metoprolol tartrate (LOPRESSOR) 25 MG tablet Take 1 tablet (25 mg total) by mouth 2 (two) times daily. (Patient taking differently: Take 25 mg by mouth once. ) 60 tablet 0  . Multiple Vitamin (MULTIVITAMIN WITH MINERALS) TABS tablet Take 1 tablet by mouth daily.    [name] pyridoxine (B-6) 100 MG tablet Take 100 mg by mouth daily.    [name] thiamine 100 MG tablet Take 1 tablet (100 mg total) by mouth daily.    . Turmeric 500 MG TABS Take 1 tablet by mouth daily.    . vitamin B-12 (CYANOCOBALAMIN) 1000 MCG tablet Take 1,000 mcg by mouth daily.    . vitamin E 400 UNIT capsule Take 400 Units by mouth daily.     No current facility-administered medications for this visit.     Allergies as of 10/17/2017 - Review Complete 10/17/2017  Allergen Reaction Noted  . Hydrocodone  05/15/2017  . Benadryl [diphenhydramine hcl (sleep)] Other (See Comments) 08/18/2016    Family History  Problem Relation Age of Onset  . Hypertension Mother   . Hypertension Father   . Stroke Father   . Hypertension Sister   . Hypertension Brother   . Cancer Brother   . Cancer Brother        throat cancer  . Hypertension Brother   . Diabetes Maternal Grandmother   . Colon cancer Neg Hx     Social History   Socioeconomic History  . Marital status: Single    Spouse name: Not on file  . Number of children: 0  . Years of education: 52  . Highest education level: Not on file    Occupational History  . Occupation: unemployed, Retail buyer: UNEMPLOYED  Social Needs  . Financial resource strain: Not on file  . Food insecurity:    Worry: Not on file    Inability: Not on file  . Transportation needs:    Medical: No    Non-medical: No  Tobacco Use  . Smoking status: Former Smoker    Packs/day: 1.50    Years: 33.00    Pack years: 49.50    Types: Cigarettes    Last attempt to quit: 08/16/2004    Years since quitting: 13.1  . Smokeless tobacco: Current User    Types: Snuff  . Tobacco comment: Dips every once in a while  Substance and Sexual Activity  .  Alcohol use: Yes    Alcohol/week: 3.6 oz    Types: 6 Cans of beer per week    Comment: drinks 6 pack of beer on weekends  . Drug use: No  . Sexual activity: Not on file  Lifestyle  . Physical activity:    Days per week: Not on file    Minutes per session: Not on file  . Stress: Not on file  Relationships  . Social connections:    Talks on phone: Not on file    Gets together: Not on file    Attends religious service: Not on file    Active member of club or organization: Not on file    Attends meetings of clubs or organizations: Not on file    Relationship status: Not on file  Other Topics Concern  . Not on file  Social History Narrative   Lives alone   No caffeine   Right handed    Review of Systems: Complete ROS negative except as per HPI.   Physical Exam: BP (!) 144/86   Pulse 79   Temp (!) 97.5 F (36.4 C) (Oral)   Ht 6\' 2"  (1.88 m)   Wt 167 lb 6.4 oz (75.9 kg)   BMI 21.49 kg/m  General:   Alert and oriented. Pleasant and cooperative. Well-nourished and well-developed. Has a walker to ambulate. Eyes:  Without icterus, sclera clear and conjunctiva pink.  Ears:  Normal auditory acuity. Cardiovascular:  S1, S2 present without murmurs appreciated. Extremities without clubbing or edema. Respiratory:  Clear to auscultation bilaterally. No wheezes, rales, or rhonchi. No  distress.  Gastrointestinal:  +BS, soft, non-tender and non-distended. No HSM noted. No guarding or rebound. No masses appreciated.  Rectal:  Deferred  Musculoskalatal:  Symmetrical without gross deformities. Neurologic:  Alert and oriented x4;  grossly normal neurologically. Psych:  Alert and cooperative. Normal mood and affect. Heme/Lymph/Immune: No excessive bruising noted.    10/17/2017 1:47 PM   Disclaimer: This note was dictated with voice recognition software. Similar sounding words can inadvertently be transcribed and may not be corrected upon review.

## 2017-11-05 ENCOUNTER — Ambulatory Visit (INDEPENDENT_AMBULATORY_CARE_PROVIDER_SITE_OTHER): Payer: Medicaid Other | Admitting: Orthopedic Surgery

## 2017-11-05 ENCOUNTER — Encounter: Payer: Self-pay | Admitting: Orthopedic Surgery

## 2017-11-05 VITALS — BP 119/75 | HR 63 | Ht 74.0 in | Wt 173.0 lb

## 2017-11-05 DIAGNOSIS — G5603 Carpal tunnel syndrome, bilateral upper limbs: Secondary | ICD-10-CM

## 2017-11-05 DIAGNOSIS — R200 Anesthesia of skin: Secondary | ICD-10-CM

## 2017-11-05 NOTE — Patient Instructions (Signed)
Carpal Tunnel Syndrome Carpal tunnel syndrome is a condition that causes pain in your hand and arm. The carpal tunnel is a narrow area that is on the palm side of your wrist. Repeated wrist motion or certain diseases may cause swelling in the tunnel. This swelling can pinch the main nerve in the wrist (median nerve). Follow these instructions at home: If you have a splint:  Wear it as told by your doctor. Remove it only as told by your doctor.  Loosen the splint if your fingers: ? Become numb and tingle. ? Turn blue and cold.  Keep the splint clean and dry. General instructions  Take over-the-counter and prescription medicines only as told by your doctor.  Rest your wrist from any activity that may be causing your pain. If needed, talk to your employer about changes that can be made in your work, such as getting a wrist pad to use while typing.  If directed, apply ice to the painful area: ? Put ice in a plastic bag. ? Place a towel between your skin and the bag. ? Leave the ice on for 20 minutes, 2-3 times per day.  Keep all follow-up visits as told by your doctor. This is important.  Do any exercises as told by your doctor, physical therapist, or occupational therapist. Contact a doctor if:  You have new symptoms.  Medicine does not help your pain.  Your symptoms get worse. This information is not intended to replace advice given to you by your health care provider. Make sure you discuss any questions you have with your health care provider. Document Released: 02/16/2011 Document Revised: 08/05/2015 Document Reviewed: 07/15/2014 Elsevier Interactive Patient Education  2018 [employer] Inc[employer]  

## 2017-11-05 NOTE — Progress Notes (Signed)
NEW PATIENT OFFICE VISI  Chief Complaint  Patient presents with  . Wrist Pain    bilateral    57 yo male status post CVA ambulatory with walker complains of bilateral wrist pain numbness tingling loss of strength.  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  Pain is also complained as well seems to be exacerbated by using the walker   Review of Systems  Constitutional: Negative for chills and fever.  Musculoskeletal: Positive for joint pain.  Skin: Negative for rash.  Neurological: Positive for tingling, sensory change, focal weakness and weakness.     Past Medical History:  Diagnosis Date  . Abdominal pain   . Adenomatous colon polyp 2008  . Arthritis   . Back pain   . Bilateral lumbar radiculopathy   . Blurred vision, bilateral   . Chronic back pain   . Constipation   . Depression   . GERD (gastroesophageal reflux disease)   . Gout   . Hemorrhoids   . High cholesterol   . Hypertension   . Neck pain   . Neuropathy of both feet   . Numbness    rectal  . Numbness of legs   . Stroke ([place])   . Substance abuse ([place])    h/o excessive alcohol use; pt says he limits use to one to 2 beers daily he currently    Past Surgical History:  Procedure Laterality Date  . COLONOSCOPY  10/09/2006   3 mm pedunculated sigmoid colon polyp removed/8-mm sessile hepatic flexure polyp (tubular villous adenoma) removed/6-mm  descending colon polyp removed/small internal hemorrhoids  . COLONOSCOPY  02/28/2011   [id number]:[id number], multiple in the rectum/Internal hemorrhoids, MODERATE-CAUSING RECTAL BLEEDING  . COLONOSCOPY N/A 07/05/2016   Procedure: COLONOSCOPY;  Surgeon: [name], MD;  [facility];  Service: Endoscopy;  Laterality: N/A;  11:15am  . 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 Mild erosive gastrtitis most likely due to indomethacin   . FINGER SURGERY Right    4th and 5th digit  . FLEXIBLE SIGMOIDOSCOPY  01/02/2012   SLF: 1. the colonic mucosa appeared normal in the sigmoid colon 2. Large internal hemorrhoids: cause for rectal bleeding/pain . s/p banding X 3   . HAND SURGERY    . SAVORY DILATION N/A 07/21/2014   Procedure: SAVORY DILATION;  Surgeon: [name], MD;  [facility];  Service: Endoscopy;  Laterality: N/A;    Family History  Problem Relation Age of Onset  . Hypertension Mother   . Hypertension Father   . Stroke Father   . Hypertension Sister   . Hypertension Brother   . Cancer Brother   . Cancer Brother        throat cancer  . Hypertension Brother   . Diabetes Maternal Grandmother   . Colon cancer Neg Hx    Social History   Tobacco Use  . Smoking status: Former Smoker    Packs/day: 1.50    Years: 33.00    Pack years: 49.50    Types: Cigarettes    Last attempt to quit: 08/16/2004    Years since quitting: 13.2  . Smokeless tobacco: Current User    Types: Snuff  . Tobacco comment: Dips every once in a while  Substance Use Topics  . Alcohol use: Yes  Alcohol/week: 6.0 standard drinks    Types: 6 Cans of beer per week    Comment: drinks 6 pack of beer on weekends  . Drug use: No    Allergies  Allergen Reactions  . Hydrocodone     hallucinations from hydrocodone  . Benadryl [Diphenhydramine Hcl (Sleep)] Other (See Comments)    Palpitations    Current Meds  Medication Sig  . aspirin EC 81 MG tablet Take 81 mg by mouth daily.   . cholecalciferol (VITAMIN D) 1000 units tablet Take 2,000 Units by mouth daily.   [name] docusate sodium (COLACE) 100 MG capsule Take 100 mg by mouth 3 (three) times daily as needed for mild constipation.  [name] FLUoxetine (PROZAC) 20 MG tablet Take 1 tablet (20 mg total) by mouth daily.  . folic acid (FOLVITE) 323 MCG tablet Take 400 mcg by mouth daily.  . Multiple  Vitamin (MULTIVITAMIN WITH MINERALS) TABS tablet Take 1 tablet by mouth daily.  [name] omeprazole (PRILOSEC) 20 MG capsule Take 1 capsule (20 mg total) by mouth daily.  [name] pyridoxine (B-6) 100 MG tablet Take 100 mg by mouth daily.  [name] thiamine 100 MG tablet Take 1 tablet (100 mg total) by mouth daily.  . Turmeric 500 MG TABS Take 1 tablet by mouth daily.  . vitamin B-12 (CYANOCOBALAMIN) 1000 MCG tablet Take 1,000 mcg by mouth daily.  . vitamin E 400 UNIT capsule Take 400 Units by mouth daily.    BP 119/75   Pulse 63   Ht 6\' 2"  (1.88 m)   Wt 173 lb (78.5 kg)   BMI 22.21 kg/m   Physical Exam  Constitutional: He is oriented to person, place, and time. He appears well-developed and well-nourished.  Vital signs have been reviewed and are stable. Gen. appearance the patient is well-developed and well-nourished with normal grooming and hygiene.   Neurological: He is alert and oriented to person, place, and time.  Skin: Skin is warm and dry. No erythema.  Psychiatric: He has a normal mood and affect.  Vitals reviewed.   Right Hand Exam   Tenderness  The patient is experiencing tenderness in the palmer area.  Range of Motion  The patient has normal right wrist ROM.   Muscle Strength  Wrist extension: 5/5  Wrist flexion: 5/5  Grip: 4/5   Tests  Phalen's sign: positive Tinel's sign (median nerve): positive Finkelstein's test: negative  Other  Erythema: absent Scars: absent Sensation: decreased Pulse: present   Left Hand Exam   Tenderness  The patient is experiencing tenderness in the palmer area.   Range of Motion  The patient has normal left wrist ROM.  Muscle Strength  Wrist extension: 5/5  Wrist flexion: 5/5  Grip:  4/5   Tests  Phalen's sign: positive Tinel's sign (median nerve): positive Finkelstein's test: negative  Other  Erythema: absent Scars: absent Sensation: normal Pulse: present        MEDICAL DECISION SECTION  Xrays were done at [facility]  My independent reading of xrays:  4 views left wrist scapholunate ligaments On the left wrist  4 views right wrist normal x-ray  Encounter Diagnoses  Name Primary?  . Numbness of fingers of both hands   . Carpal tunnel syndrome, bilateral upper limbs Yes    PLAN: (Rx., injectx, surgery, frx, mri/ct) Carpal tunnel syndrome carpal tunnel testing with nerve conduction study follow-up after study  No orders of the defined types were placed in this encounter.   Arther Abbott, MD  11/05/2017 9:44 AM

## 2017-11-21 ENCOUNTER — Encounter (INDEPENDENT_AMBULATORY_CARE_PROVIDER_SITE_OTHER): Payer: Self-pay | Admitting: Physical Medicine and Rehabilitation

## 2017-11-21 ENCOUNTER — Ambulatory Visit (INDEPENDENT_AMBULATORY_CARE_PROVIDER_SITE_OTHER): Payer: Medicaid Other | Admitting: Physical Medicine and Rehabilitation

## 2017-11-21 DIAGNOSIS — G621 Alcoholic polyneuropathy: Secondary | ICD-10-CM | POA: Diagnosis not present

## 2017-11-21 DIAGNOSIS — G63 Polyneuropathy in diseases classified elsewhere: Secondary | ICD-10-CM

## 2017-11-21 DIAGNOSIS — R202 Paresthesia of skin: Secondary | ICD-10-CM | POA: Diagnosis not present

## 2017-11-21 DIAGNOSIS — E538 Deficiency of other specified B group vitamins: Secondary | ICD-10-CM | POA: Diagnosis not present

## 2017-11-21 NOTE — Progress Notes (Signed)
 .  Numeric Pain Rating Scale and Functional Assessment Average Pain 8   In the last MONTH (on 0-10 scale) has pain interfered with the following?  1. General activity like being  able to carry out your everyday physical activities such as walking, climbing stairs, carrying groceries, or moving a chair?  Rating(6)     

## 2017-11-21 NOTE — Progress Notes (Signed)
Tristan Cook - 57 y.o. male MRN [id number]  Date of birth: 10/17/1960  Office Visit Note: Visit Date: 11/21/2017 PCP: Patient, No Pcp Per Referred by: Carole Civil, MD  Subjective: Chief Complaint  Patient presents with  . Right Elbow - Pain, Numbness, Tingling  . Left Elbow - Tingling, Pain, Numbness  . Right Forearm - Tingling, Numbness, Pain  . Left Forearm - Tingling, Pain, Numbness  . Right Hand - Pain, Numbness, Tingling  . Left Hand - Pain, Numbness, Tingling   HPI: Tristan Cook is a 57 year old right-hand-dominant gentleman who comes in today at the request of Dr. Arther Abbott for evaluation management of bilateral hand pain and numbness.  He recently saw Dr. Aline Brochure for complaints of chronic several year history but worsening over the last 8 months of pain numbness tingling in both elbows radiating into both arms and both hands and all fingers in a really glove distribution globally.  He reports worsening symptoms with holding objects and position.  He says nothing really helps the symptoms at all.  He has tried multiple medications in the past without any relief.  Dr. Aline Brochure was concern for carpal tunnel syndrome.  He reports 8 out of 10 constant symptoms.  His case is very complicated.  Patient is ambulating with a cane with gait abnormality.  He has been seen and evaluated by Dr. Delfin Edis at [facility].  The notes are in the chart.  He had prior electrodiagnostic study in 2018 which is a 3 limb study.  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  The patient states he was told that it was exposure to lead.  Dr. Chong Sicilian notes suggest it was likely due to substance abuse.  The patient has a prior history of alcohol abuse.  He does not have any diabetes and his hemoglobin A1c is not been elevated.  He does get numbness tingling and weakness in both feet.  He has MRI evidence of prior stroke.  He also carries a diagnosis of B12  deficiency.  He reports cervical injections in the cervical spine many years ago.  He has not had prior cervical surgery.  He denies any symptoms shooting down the arms except from the elbows down.   Review of Systems  Constitutional: Positive for malaise/fatigue and weight loss. Negative for chills and fever.  HENT: Negative for hearing loss and sinus pain.   Eyes: Negative for blurred vision, double vision and photophobia.  Respiratory: Negative for cough and shortness of breath.   Cardiovascular: Negative for chest pain, palpitations and leg swelling.  Gastrointestinal: Negative for abdominal pain, nausea and vomiting.  Genitourinary: Negative for flank pain.  Musculoskeletal: Positive for back pain and joint pain. Negative for myalgias.       Bilateral hand pain  Skin: Negative for itching and rash.  Neurological: Positive for tingling, sensory change and weakness. Negative for tremors and focal weakness.  Endo/Heme/Allergies: Negative.   Psychiatric/Behavioral: Negative for depression.  All other systems reviewed and are negative.  Otherwise per HPI.  Assessment & Plan: Visit Diagnoses:  1. 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 Vitamin B12 deficiency neuropathy (HCC)     Plan: Impression: The above electrodiagnostic study is ABNORMAL and reveals evidence of sensory and axonal predominant sensorimotor demyelinating and axonal peripheral neuropathy of bilateral upper extremities.  There appears to be some involvement of the bilateral median nerves at the wrist which could be from a concomitant carpal  tunnel entrapment.  There is more evidence of slowing on the motor nerves for the median nerve compared to the ulnar nerve. **Prior electrodiagnostic study by Reece Agar was consistent with peripheral polyneuropathy consistent with alcohol abuse.  There is no significant electrodiagnostic evidence of any other focal nerve entrapment, brachial plexopathy  or cervical radiculopathy.   Recommendations: 1.  Follow-up with referring physician. 2.  Continue current management of symptoms.  Could consider diagnostic carpal tunnel injection to see if it helped the symptoms in his hands.  If this was diagnostic.  Consider decompression treatment.   Meds & Orders: No orders of the defined types were placed in this encounter.   Orders Placed This Encounter  Procedures  . NCV with EMG (electromyography)    Follow-up: Return for Arther Abbott, MD.   Procedures: No procedures performed  EMG & NCV Findings: Evaluation of the left median motor and the right median motor nerves showed prolonged distal onset latency (L4.5, R4.5 ms) and decreased conduction velocity (Elbow-Wrist, L45, R36 m/s).  The left ulnar motor and the right ulnar motor nerves showed decreased conduction velocity (B Elbow-Wrist, L39, R48 m/s).  The left median (across palm) sensory nerve showed no response (Wrist) and prolonged distal peak latency (Palm, 7.6 ms).  The right median (across palm) sensory nerve showed no response (Palm), prolonged distal peak latency (4.7 ms), and reduced amplitude (5.4 V).  The left radial sensory and the right radial sensory nerves showed no response (Wrist).  The left ulnar sensory and the right ulnar sensory nerves showed prolonged distal peak latency (L4.5, R4.0 ms), reduced amplitude (L8.2, R8.0 V), and decreased conduction velocity (Wrist-5th Digit, L31, R35 m/s).  Left vs. Right side comparison data for the ulnar sensory nerve indicates abnormal L-R latency difference (0.5 ms).  All remaining left vs. right side differences were within normal limits.    All examined muscles (as indicated in the following table) showed no evidence of electrical instability.    Impression: The above electrodiagnostic study is ABNORMAL and reveals evidence of sensory and axonal predominant sensorimotor demyelinating and axonal peripheral neuropathy of bilateral upper  extremities.  There appears to be some involvement of the bilateral median nerves at the wrist which could be from a concomitant carpal tunnel entrapment.  There is more evidence of slowing on the motor nerves for the median nerve compared to the ulnar nerve. **Prior electrodiagnostic study by Reece Agar was consistent with peripheral polyneuropathy consistent with alcohol abuse.  There is no significant electrodiagnostic evidence of any other focal nerve entrapment, brachial plexopathy or cervical radiculopathy.   Recommendations: 1.  Follow-up with referring physician. 2.  Continue current management of symptoms.  Could consider diagnostic carpal tunnel injection to see if it helped the symptoms in his hands.  If this was diagnostic.  Consider decompression treatment.  ___________________________ Wonda Olds Board Certified, American Board of Physical Medicine and Rehabilitation    Nerve Conduction Studies Anti Sensory Summary Table   Stim Site NR Peak (ms) Norm Peak (ms) P-T Amp (V) Norm P-T Amp Site1 Site2 Delta-P (ms) Dist (cm) Vel (m/s) Norm Vel (m/s)  Left Median Acr Palm Anti Sensory (2nd Digit)  29.3C  Wrist *NR  <3.6  >10 Wrist Palm  0.0    Palm    *7.6 <2.0 7.6         Right Median Acr Palm Anti Sensory (2nd Digit)  30.1C  Wrist    *4.7 <3.6 *5.4 >10 Wrist Palm  0.0  Palm *NR  <2.0          Left Radial Anti Sensory (Base 1st Digit)  28.5C  Wrist *NR  <3.1   Wrist Base 1st Digit  0.0    Right Radial Anti Sensory (Base 1st Digit)  30.8C  Wrist *NR  <3.1   Wrist Base 1st Digit  0.0    Left Ulnar Anti Sensory (5th Digit)  28.9C  Wrist    *4.5 <3.7 *8.2 >15.0 Wrist 5th Digit 4.5 14.0 *31 >38  Right Ulnar Anti Sensory (5th Digit)  30.2C  Wrist    *4.0 <3.7 *8.0 >15.0 Wrist 5th Digit 4.0 14.0 *35 >38   Motor Summary Table   Stim Site NR Onset (ms) Norm Onset (ms) O-P Amp (mV) Norm O-P Amp Site1 Site2 Delta-0 (ms) Dist (cm) Vel (m/s) Norm Vel (m/s)  Left  Median Motor (Abd Poll Brev)  28.6C  Wrist    *4.5 <4.2 10.4 >5 Elbow Wrist 5.7 25.8 *45 >50  Elbow    10.2  8.9         Right Median Motor (Abd Poll Brev)  30.6C  Wrist    *4.5 <4.2 10.0 >5 Elbow Wrist 5.3 19.0 *36 >50  Elbow    9.8  9.1         Left Ulnar Motor (Abd Dig Min)  28.5C  Wrist    4.1 <4.2 10.9 >3 B Elbow Wrist 6.3 24.5 *39 >53  B Elbow    10.4  9.3  A Elbow B Elbow 1.4 10.5 75 >53  A Elbow    11.8  9.4         Right Ulnar Motor (Abd Dig Min)  30.4C  Wrist    3.8 <4.2 11.9 >3 B Elbow Wrist 5.4 26.0 *48 >53  B Elbow    9.2  10.1  A Elbow B Elbow 1.7 10.0 59 >53  A Elbow    10.9  10.0          EMG   Side Muscle Nerve Root Ins Act Fibs Psw Amp Dur Poly Recrt Int Fraser Din Comment  Right Abd Poll Brev Median C8-T1 Nml Nml Nml Nml Nml 0 Nml Nml   Right 1stDorInt Ulnar C8-T1 Nml Nml Nml Nml Nml 0 Nml Nml   Right PronatorTeres Median C6-7 Nml Nml Nml Nml Nml 0 Nml Nml   Right Biceps Musculocut C5-6 Nml Nml Nml Nml Nml 0 Nml Nml   Right Deltoid Axillary C5-6 Nml Nml Nml Nml Nml 0 Nml Nml     Nerve Conduction Studies Anti Sensory Left/Right Comparison   Stim Site L Lat (ms) R Lat (ms) L-R Lat (ms) L Amp (V) R Amp (V) L-R Amp (%) Site1 Site2 L Vel (m/s) R Vel (m/s) L-R Vel (m/s)  Median Acr Palm Anti Sensory (2nd Digit)  29.3C  Wrist  *4.7   *5.4  Wrist Palm     Palm *7.6   7.6         Radial Anti Sensory (Base 1st Digit)  28.5C  Wrist       Wrist Base 1st Digit     Ulnar Anti Sensory (5th Digit)  28.9C  Wrist *4.5 *4.0 *0.5 *8.2 *8.0 2.4 Wrist 5th Digit *31 *35 4   Motor Left/Right Comparison   Stim Site L Lat (ms) R Lat (ms) L-R Lat (ms) L Amp (mV) R Amp (mV) L-R Amp (%) Site1 Site2 L Vel (m/s) R Vel (m/s) L-R Vel (m/s)  Median Motor (Abd  Poll Brev)  28.6C  Wrist *4.5 *4.5 0.0 10.4 10.0 3.8 Elbow Wrist *45 *36 9  Elbow 10.2 9.8 0.4 8.9 9.1 2.2       Ulnar Motor (Abd Dig Min)  28.5C  Wrist 4.1 3.8 0.3 10.9 11.9 8.4 B Elbow Wrist *39 *48 9  B Elbow 10.4 9.2 1.2  9.3 10.1 7.9 A Elbow B Elbow 75 59 16  A Elbow 11.8 10.9 0.9 9.4 10.0 6.0          Waveforms:                     Clinical History: Visit Date:                     01/04/17 08:14 Age:                               33 Years 1 Months Old Examining Physician:  Sarina Ill, MD  Referring Physician:    Jaynee Eagles, MD  History: 57 y.o. malehere as a referral from Dr. Avelino Leeds neuropathy. 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 Patient has progressive weakness which is more proximal in the upper extremities and proximal in the lower extremities with significantly impaired sensation in the arms and legs. Patient has known neuropathy, EMG nerve conduction study ordered to evaluate for weakness and rule out myopathy or myositis, neuromuscular disorder or other etiologies.  Summary: The right median motor nerve showed delayed distal onset latency (4.6 ms, N< 4.4). The right Ulnar motor nerve showed delayed distal onset latency (3.6 ms, N< 3.3). The left Peroneal motor nerve showed delayed distal onset latency (7.7 ms, N< 6.5) and reduced amplitude (1.8 mV, N>2). The left Tibial motor nerve showed delayed distal onset latency (6.0 ms, N< 5.8) and reduced amplitude (3.6 mV, N>4). The right Tibial motor nerve showed delayed distal onset latency (5.9 ms, N< 5.8) and reduced amplitude (2.3 mV, N>4). The right radial sensory nerve showed delayed distal peak latency (3.8 ms, N<2.9). The right sural, right superficial peroneal and left superficial peroneal sensory nerves showed no response. The right median orthodromic sensory nerve showed delayed distal peak latency (4.2 ms, N<3.4) and reduced amplitude (3 V, N>10). The right ulnar orthodromic sensory nerve showed delayed distal peak latency (4.2 ms, N<3.1) and reduced amplitude (3  V, N>5). The left tibial F-wave showed delayed latency (68.3 ms, N<56). The right tibial F-wave showed delayed latency (69 ms, N<56). The right ulnar F-wave showed delayed latency (35.5 ms, N<32). All muscles were within normal limits.     Conclusion: There is electrophysiologic evidence of moderately severe, axonal, length-dependent, distal polyneuropathy. No suggestion of CTS, myopathy, myositis, neuromuscular disease or radiculopathy.   Sarina Ill M.D.  [facility] [place], [place] [address] Tel: [phone] Fax: [phone]   He reports that he quit smoking about 13 years ago. His smoking use included cigarettes. He has a 49.50 pack-year smoking history. His smokeless tobacco use includes snuff. No results for input(s): HGBA1C, LABURIC in the last 8760 hours.  Objective:  VS:  HT:    WT:   BMI:     BP:   HR: bpm  TEMP: ( )  RESP:  Physical Exam  Constitutional: He is oriented to person, place, and time. He appears well-developed. No distress.  Thin and frail  HENT:  Head: Normocephalic and atraumatic.  Nose: Nose normal.  Mouth/Throat: Oropharynx is clear and moist.  Eyes: Pupils are equal, round, and reactive to light. Conjunctivae are normal.  Neck: Normal range of motion. Neck supple. No JVD present. No tracheal deviation present.  Cardiovascular: Regular rhythm and intact distal pulses.  Pulmonary/Chest: Effort normal and breath sounds normal.  Abdominal: Soft. He exhibits no distension. There is no rebound and no guarding.  Musculoskeletal: He exhibits no deformity.  Inspection reveals no atrophy of the bilateral APB or FDI or hand intrinsics. There is no swelling, color changes, allodynia or dystrophic changes.  His hands are extremely cold bilaterally.  There is 5 out of 5 strength in the bilateral wrist extension, finger abduction and long finger flexion.  There is decreased sensation to light touch globally in a glove distribution  bilaterally. There is a negative Phalen's test bilaterally. There is a negative Hoffmann's test bilaterally.  Lymphadenopathy:    He has no cervical adenopathy.  Neurological: He is alert and oriented to person, place, and time. He displays normal reflexes. He exhibits normal muscle tone. Coordination abnormal.  Patient has symmetric trace reflexes at the biceps and brachioradialis bilaterally.  Skin: Skin is warm. No rash noted.  Psychiatric: He has a normal mood and affect. His behavior is normal.  Nursing note and vitals reviewed.   Ortho Exam Imaging: No results found.  Past Medical/Family/Surgical/Social History: Medications & Allergies reviewed per EMR, new medications updated. Patient Active Problem List   Diagnosis Date Noted  . Constipation 10/17/2017  . Hoarseness 10/17/2017  . Weakness 04/11/2017  . Weakness of both legs 04/10/2017  . Vitamin B deficiency 01/22/2017  . 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 Alcoholic peripheral neuropathy ([place]) 01/17/2017  . Vitamin B12 deficiency neuropathy ([place]) 01/17/2017  . History of colonic polyps 06/14/2016  . Elevated blood pressure reading 06/14/2016  . Hemorrhoids 09/20/2015  . Depression 08/03/2015  . Back pain with radiation 06/15/2015  . Insomnia 06/03/2015  . Hyperlipidemia LDL goal <130 04/25/2015  . 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 Fatigue 03/29/2015  . Allergic rhinitis 02/10/2015  . Esophageal stricture 10/22/2014  . Overweight (BMI 25.0-29.9) 10/06/2013  . GERD (gastroesophageal reflux disease) 04/23/2012  . Hemorrhoids, internal 11/02/2011  . Essential hypertension 07/25/2007  . Neck pain on left side 07/25/2007   Past Medical History:  Diagnosis Date  . Abdominal pain   . Adenomatous colon polyp 2008  . Arthritis   . Back pain   . Bilateral lumbar radiculopathy   . Blurred vision, bilateral   . Chronic back pain   . Constipation   . Depression   . GERD  (gastroesophageal reflux disease)   . Gout   . Hemorrhoids   . High cholesterol   . Hypertension   . Neck pain   . Neuropathy of both feet   . Numbness    rectal  . Numbness of legs   . Stroke ([place])   . Substance abuse ([place])    h/o excessive alcohol use; pt says he limits use to one to 2 beers daily he currently   Family History  Problem Relation Age of Onset  . Hypertension Mother   . Hypertension Father   . Stroke Father   . Hypertension Sister   . Hypertension Brother   . Cancer Brother   . Cancer Brother        throat cancer  . Hypertension Brother   .  Diabetes Maternal Grandmother   . Colon cancer Neg Hx    Past Surgical History:  Procedure Laterality Date  . COLONOSCOPY  10/09/2006   3 mm pedunculated sigmoid colon polyp removed/8-mm sessile hepatic flexure polyp (tubular villous adenoma) removed/6-mm  descending colon polyp removed/small internal hemorrhoids  . COLONOSCOPY  02/28/2011   KLK:[id number], multiple in the rectum/Internal hemorrhoids, MODERATE-CAUSING RECTAL BLEEDING  . COLONOSCOPY N/A 07/05/2016   Procedure: COLONOSCOPY;  Surgeon: [name], MD;  Location: [facility];  Service: Endoscopy;  Laterality: N/A;  11:15am  . 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 Mild erosive gastrtitis most likely due to indomethacin   . FINGER SURGERY Right    4th and 5th digit  . FLEXIBLE SIGMOIDOSCOPY  01/02/2012   SLF: 1. the colonic mucosa appeared normal in the sigmoid colon 2. Large internal hemorrhoids: cause for rectal bleeding/pain . s/p banding X 3   . HAND SURGERY    . SAVORY DILATION N/A 07/21/2014   Procedure: SAVORY DILATION;  Surgeon: [name], MD;  Location: [facility];  Service: Endoscopy;  Laterality: N/A;   Social History   Occupational History  . Occupation: unemployed, Retail buyer: UNEMPLOYED  Tobacco Use  . Smoking status: Former Smoker    Packs/day: 1.50     Years: 33.00    Pack years: 49.50    Types: Cigarettes    Last attempt to quit: 08/16/2004    Years since quitting: 13.2  . Smokeless tobacco: Current User    Types: Snuff  . Tobacco comment: Dips every once in a while  Substance and Sexual Activity  . Alcohol use: Yes    Alcohol/week: 6.0 standard drinks    Types: 6 Cans of beer per week    Comment: drinks 6 pack of beer on weekends  . Drug use: No  . Sexual activity: Not on file

## 2017-11-22 NOTE — Procedures (Signed)
EMG & NCV Findings: Evaluation of the left median motor and the right median motor nerves showed prolonged distal onset latency (L4.5, R4.5 ms) and decreased conduction velocity (Elbow-Wrist, L45, R36 m/s).  The left ulnar motor and the right ulnar motor nerves showed decreased conduction velocity (B Elbow-Wrist, L39, R48 m/s).  The left median (across palm) sensory nerve showed no response (Wrist) and prolonged distal peak latency (Palm, 7.6 ms).  The right median (across palm) sensory nerve showed no response (Palm), prolonged distal peak latency (4.7 ms), and reduced amplitude (5.4 V).  The left radial sensory and the right radial sensory nerves showed no response (Wrist).  The left ulnar sensory and the right ulnar sensory nerves showed prolonged distal peak latency (L4.5, R4.0 ms), reduced amplitude (L8.2, R8.0 V), and decreased conduction velocity (Wrist-5th Digit, L31, R35 m/s).  Left vs. Right side comparison data for the ulnar sensory nerve indicates abnormal L-R latency difference (0.5 ms).  All remaining left vs. right side differences were within normal limits.    All examined muscles (as indicated in the following table) showed no evidence of electrical instability.    Impression: The above electrodiagnostic study is ABNORMAL and reveals evidence of sensory and axonal predominant sensorimotor demyelinating and axonal peripheral neuropathy of bilateral upper extremities.  There appears to be some involvement of the bilateral median nerves at the wrist which could be from a concomitant carpal tunnel entrapment.  There is more evidence of slowing on the motor nerves for the median nerve compared to the ulnar nerve. **Prior electrodiagnostic study by Reece Agar was consistent with peripheral polyneuropathy consistent with alcohol abuse.  There is no significant electrodiagnostic evidence of any other focal nerve entrapment, brachial plexopathy or cervical radiculopathy.    Recommendations: 1.  Follow-up with referring physician. 2.  Continue current management of symptoms.  Could consider diagnostic carpal tunnel injection to see if it helped the symptoms in his hands.  If this was diagnostic.  Consider decompression treatment.  ___________________________ Wonda Olds Board Certified, American Board of Physical Medicine and Rehabilitation    Nerve Conduction Studies Anti Sensory Summary Table   Stim Site NR Peak (ms) Norm Peak (ms) P-T Amp (V) Norm P-T Amp Site1 Site2 Delta-P (ms) Dist (cm) Vel (m/s) Norm Vel (m/s)  Left Median Acr Palm Anti Sensory (2nd Digit)  29.3C  Wrist *NR  <3.6  >10 Wrist Palm  0.0    Palm    *7.6 <2.0 7.6         Right Median Acr Palm Anti Sensory (2nd Digit)  30.1C  Wrist    *4.7 <3.6 *5.4 >10 Wrist Palm  0.0    Palm *NR  <2.0          Left Radial Anti Sensory (Base 1st Digit)  28.5C  Wrist *NR  <3.1   Wrist Base 1st Digit  0.0    Right Radial Anti Sensory (Base 1st Digit)  30.8C  Wrist *NR  <3.1   Wrist Base 1st Digit  0.0    Left Ulnar Anti Sensory (5th Digit)  28.9C  Wrist    *4.5 <3.7 *8.2 >15.0 Wrist 5th Digit 4.5 14.0 *31 >38  Right Ulnar Anti Sensory (5th Digit)  30.2C  Wrist    *4.0 <3.7 *8.0 >15.0 Wrist 5th Digit 4.0 14.0 *35 >38   Motor Summary Table   Stim Site NR Onset (ms) Norm Onset (ms) O-P Amp (mV) Norm O-P Amp Site1 Site2 Delta-0 (ms) Dist (cm) Vel (m/s) Norm Vel (  m/s)  Left Median Motor (Abd Poll Brev)  28.6C  Wrist    *4.5 <4.2 10.4 >5 Elbow Wrist 5.7 25.8 *45 >50  Elbow    10.2  8.9         Right Median Motor (Abd Poll Brev)  30.6C  Wrist    *4.5 <4.2 10.0 >5 Elbow Wrist 5.3 19.0 *36 >50  Elbow    9.8  9.1         Left Ulnar Motor (Abd Dig Min)  28.5C  Wrist    4.1 <4.2 10.9 >3 B Elbow Wrist 6.3 24.5 *39 >53  B Elbow    10.4  9.3  A Elbow B Elbow 1.4 10.5 75 >53  A Elbow    11.8  9.4         Right Ulnar Motor (Abd Dig Min)  30.4C  Wrist    3.8 <4.2 11.9 >3 B Elbow Wrist 5.4  26.0 *48 >53  B Elbow    9.2  10.1  A Elbow B Elbow 1.7 10.0 59 >53  A Elbow    10.9  10.0          EMG   Side Muscle Nerve Root Ins Act Fibs Psw Amp Dur Poly Recrt Int Fraser Din Comment  Right Abd Poll Brev Median C8-T1 Nml Nml Nml Nml Nml 0 Nml Nml   Right 1stDorInt Ulnar C8-T1 Nml Nml Nml Nml Nml 0 Nml Nml   Right PronatorTeres Median C6-7 Nml Nml Nml Nml Nml 0 Nml Nml   Right Biceps Musculocut C5-6 Nml Nml Nml Nml Nml 0 Nml Nml   Right Deltoid Axillary C5-6 Nml Nml Nml Nml Nml 0 Nml Nml     Nerve Conduction Studies Anti Sensory Left/Right Comparison   Stim Site L Lat (ms) R Lat (ms) L-R Lat (ms) L Amp (V) R Amp (V) L-R Amp (%) Site1 Site2 L Vel (m/s) R Vel (m/s) L-R Vel (m/s)  Median Acr Palm Anti Sensory (2nd Digit)  29.3C  Wrist  *4.7   *5.4  Wrist Palm     Palm *7.6   7.6         Radial Anti Sensory (Base 1st Digit)  28.5C  Wrist       Wrist Base 1st Digit     Ulnar Anti Sensory (5th Digit)  28.9C  Wrist *4.5 *4.0 *0.5 *8.2 *8.0 2.4 Wrist 5th Digit *31 *35 4   Motor Left/Right Comparison   Stim Site L Lat (ms) R Lat (ms) L-R Lat (ms) L Amp (mV) R Amp (mV) L-R Amp (%) Site1 Site2 L Vel (m/s) R Vel (m/s) L-R Vel (m/s)  Median Motor (Abd Poll Brev)  28.6C  Wrist *4.5 *4.5 0.0 10.4 10.0 3.8 Elbow Wrist *45 *36 9  Elbow 10.2 9.8 0.4 8.9 9.1 2.2       Ulnar Motor (Abd Dig Min)  28.5C  Wrist 4.1 3.8 0.3 10.9 11.9 8.4 B Elbow Wrist *39 *48 9  B Elbow 10.4 9.2 1.2 9.3 10.1 7.9 A Elbow B Elbow 75 59 16  A Elbow 11.8 10.9 0.9 9.4 10.0 6.0          Waveforms:

## 2017-12-19 ENCOUNTER — Ambulatory Visit: Payer: Medicaid Other | Admitting: Nurse Practitioner

## 2017-12-19 ENCOUNTER — Telehealth: Payer: Self-pay | Admitting: Nurse Practitioner

## 2017-12-19 ENCOUNTER — Encounter: Payer: Self-pay | Admitting: Nurse Practitioner

## 2017-12-19 NOTE — Telephone Encounter (Signed)
PATIENT WAS A NO SHOW AND LETTER SENT  °

## 2017-12-19 NOTE — Telephone Encounter (Signed)
Noted  

## 2017-12-19 NOTE — Progress Notes (Deleted)
Referring Provider: No ref. provider found Primary Care Physician:  Patient, No Pcp Per Primary GI:  Dr. Oneida Alar  No chief complaint on file.   HPI:   Tristan Cook is a 57 y.o. male who presents for 33-month follow-up.  The patient was last seen in our office 10/17/2017 for constipation and hoarseness.  Noted history of GERD, peptic stricture with dysphasia, colon polyps, hemorrhoid banding with flexible sigmoidoscopy in 2013.  Colonoscopy updated 07/05/2016 which found a single 5 mm colon polyp found to be benign colon mucosa with lymphoid aggregate and recommended repeat colonoscopy in 5 years (2023).  At his last visit noted soft stools consistent with [place] 5-6 but with noted straining and incomplete emptying.  Soft stools are chronic for him.  A lot of flatulence but no abdominal pain.  Chronic hoarseness but denies GERD symptoms.  No seasonal allergies.  Denies excessive phlegm or cough, swelling.  No dysphasia or odynophagia.  No other GI symptoms.  Recommended omeprazole 20 mg daily, fiber supplement 1-2 times a day, follow-up in 2 months.  Today he states   Past Medical History:  Diagnosis Date  . Abdominal pain   . Adenomatous colon polyp 2008  . Arthritis   . Back pain   . Bilateral lumbar radiculopathy   . Blurred vision, bilateral   . Chronic back pain   . Constipation   . Depression   . GERD (gastroesophageal reflux disease)   . Gout   . Hemorrhoids   . High cholesterol   . Hypertension   . Neck pain   . Neuropathy of both feet   . Numbness    rectal  . Numbness of legs   . Stroke ([place])   . Substance abuse ([place])    h/o excessive alcohol use; pt says he limits use to one to 2 beers daily he currently    Past Surgical History:  Procedure Laterality Date  . COLONOSCOPY  10/09/2006   3 mm pedunculated sigmoid colon polyp removed/8-mm sessile hepatic flexure polyp (tubular villous adenoma) removed/6-mm  descending colon polyp removed/small internal  hemorrhoids  . COLONOSCOPY  02/28/2011   [id number], multiple in the rectum/Internal hemorrhoids, MODERATE-CAUSING RECTAL BLEEDING  . COLONOSCOPY N/A 07/05/2016   Procedure: COLONOSCOPY;  Surgeon: [name], MD;  [facility];  Service: Endoscopy;  Laterality: N/A;  11:15am  . 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 Mild erosive gastrtitis most likely due to indomethacin   . FINGER SURGERY Right    4th and 5th digit  . FLEXIBLE SIGMOIDOSCOPY  01/02/2012   SLF: 1. the colonic mucosa appeared normal in the sigmoid colon 2. Large internal hemorrhoids: cause for rectal bleeding/pain . s/p banding X 3   . HAND SURGERY    . SAVORY DILATION N/A 07/21/2014   Procedure: SAVORY DILATION;  Surgeon: [name], MD;  [facility];  Service: Endoscopy;  Laterality: N/A;    Current Outpatient Medications  Medication Sig Dispense Refill  . aspirin EC 81 MG tablet Take 81 mg by mouth daily.     . cholecalciferol (VITAMIN D) 1000 units tablet Take 2,000 Units by mouth daily.     [name] docusate sodium (COLACE) 100 MG capsule Take 100 mg by mouth 3 (three) times daily as needed for mild constipation.    [name] FLUoxetine (PROZAC) 20 MG tablet Take 1 tablet (20 mg total) by mouth daily. 30 tablet 0  . folic acid (  FOLVITE) 400 MCG tablet Take 400 mcg by mouth daily.    . metoprolol tartrate (LOPRESSOR) 25 MG tablet Take 1 tablet (25 mg total) by mouth 2 (two) times daily. (Patient taking differently: Take 25 mg by mouth once. ) 60 tablet 0  . Multiple Vitamin (MULTIVITAMIN WITH MINERALS) TABS tablet Take 1 tablet by mouth daily.    [name] omeprazole (PRILOSEC) 20 MG capsule Take 1 capsule (20 mg total) by mouth daily. 30 capsule 2  . pyridoxine (B-6) 100 MG tablet Take 100 mg by mouth daily.    [name] thiamine 100 MG tablet Take 1 tablet (100 mg total) by mouth daily.    . Turmeric 500 MG TABS Take 1 tablet by mouth daily.    . vitamin  B-12 (CYANOCOBALAMIN) 1000 MCG tablet Take 1,000 mcg by mouth daily.    . vitamin E 400 UNIT capsule Take 400 Units by mouth daily.     No current facility-administered medications for this visit.     Allergies as of 12/19/2017 - Review Complete 11/21/2017  Allergen Reaction Noted  . Hydrocodone  05/15/2017  . Benadryl [diphenhydramine hcl (sleep)] Other (See Comments) 08/18/2016    Family History  Problem Relation Age of Onset  . Hypertension Mother   . Hypertension Father   . Stroke Father   . Hypertension Sister   . Hypertension Brother   . Cancer Brother   . Cancer Brother        throat cancer  . Hypertension Brother   . Diabetes Maternal Grandmother   . Colon cancer Neg Hx     Social History   Socioeconomic History  . Marital status: Single    Spouse name: Not on file  . Number of children: 0  . Years of education: 98  . Highest education level: Not on file  Occupational History  . Occupation: unemployed, Retail buyer: UNEMPLOYED  Social Needs  . Financial resource strain: Not on file  . Food insecurity:    Worry: Not on file    Inability: Not on file  . Transportation needs:    Medical: No    Non-medical: No  Tobacco Use  . Smoking status: Former Smoker    Packs/day: 1.50    Years: 33.00    Pack years: 49.50    Types: Cigarettes    Last attempt to quit: 08/16/2004    Years since quitting: 13.3  . Smokeless tobacco: Current User    Types: Snuff  . Tobacco comment: Dips every once in a while  Substance and Sexual Activity  . Alcohol use: Yes    Alcohol/week: 6.0 standard drinks    Types: 6 Cans of beer per week    Comment: drinks 6 pack of beer on weekends  . Drug use: No  . Sexual activity: Not on file  Lifestyle  . Physical activity:    Days per week: Not on file    Minutes per session: Not on file  . Stress: Not on file  Relationships  . Social connections:    Talks on phone: Not on file    Gets together: Not on file     Attends religious service: Not on file    Active member of club or organization: Not on file    Attends meetings of clubs or organizations: Not on file    Relationship status: Not on file  Other Topics Concern  . Not on file  Social History Narrative   Lives alone  No caffeine   Right handed    Review of Systems: Complete ROS negative except as per HPI.   Physical Exam: There were no vitals taken for this visit. General:   Alert and oriented. Pleasant and cooperative. Well-nourished and well-developed.  Head:  Normocephalic and atraumatic. Eyes:  Without icterus, sclera clear and conjunctiva pink.  Ears:  Normal auditory acuity. Mouth:  No deformity or lesions, oral mucosa pink.  Throat/Neck:  Supple, without mass or thyromegaly. Cardiovascular:  S1, S2 present without murmurs appreciated. Normal pulses noted. Extremities without clubbing or edema. Respiratory:  Clear to auscultation bilaterally. No wheezes, rales, or rhonchi. No distress.  Gastrointestinal:  +BS, soft, non-tender and non-distended. No HSM noted. No guarding or rebound. No masses appreciated.  Rectal:  Deferred  Musculoskalatal:  Symmetrical without gross deformities. Normal posture. Skin:  Intact without significant lesions or rashes. Neurologic:  Alert and oriented x4;  grossly normal neurologically. Psych:  Alert and cooperative. Normal mood and affect. Heme/Lymph/Immune: No significant cervical adenopathy. No excessive bruising noted.    12/19/2017 7:50 AM   Disclaimer: This note was dictated with voice recognition software. Similar sounding words can inadvertently be transcribed and may not be corrected upon review.

## 2017-12-28 ENCOUNTER — Ambulatory Visit: Payer: Self-pay | Admitting: Orthopedic Surgery

## 2017-12-31 ENCOUNTER — Encounter: Payer: Self-pay | Admitting: Orthopedic Surgery

## 2018-01-14 ENCOUNTER — Ambulatory Visit: Payer: Self-pay | Admitting: Orthopedic Surgery

## 2018-01-21 ENCOUNTER — Ambulatory Visit: Payer: Self-pay | Admitting: Orthopedic Surgery

## 2018-01-28 ENCOUNTER — Ambulatory Visit: Payer: Medicaid Other | Admitting: Orthopedic Surgery

## 2018-01-28 VITALS — BP 127/80 | HR 74 | Ht 74.0 in | Wt 173.0 lb

## 2018-01-28 DIAGNOSIS — R200 Anesthesia of skin: Secondary | ICD-10-CM

## 2018-01-28 NOTE — Patient Instructions (Signed)
Needs appointment for bilateral knee x-rays and needs 30-minute

## 2018-01-28 NOTE — Progress Notes (Signed)
Chief Complaint  Patient presents with  . Hand Pain    Recheck on bilateral CTS.   57 year old male with paresthesias both upper extremities had a nerve conduction study.  It came back as peripheral neuropathy demyelination B12 deficiency.  As noted below he may or may not benefit from carpal tunnel release and he and I both agree that surgery will not help  He does want to be seen for his knees and I will see him for both knees to be x-rayed   Assessment & Plan: Visit Diagnoses:  1. 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 Vitamin B12 deficiency neuropathy ([facility])     Plan: Impression: The above electrodiagnostic study is ABNORMAL and reveals evidence of sensory and axonal predominant sensorimotor demyelinating and axonal peripheral neuropathy of bilateral upper extremities.  There appears to be some involvement of the bilateral median nerves at the wrist which could be from a concomitant carpal tunnel entrapment.  There is more evidence of slowing on the motor nerves for the median nerve compared to the ulnar nerve. **Prior electrodiagnostic study by Reece Agar was consistent with peripheral polyneuropathy consistent with alcohol abuse.   There is no significant electrodiagnostic evidence of any other focal nerve entrapment, brachial plexopathy or cervical radiculopathy.    Recommendations: 1.  Follow-up with referring physician. 2.  Continue current management of symptoms.  Could consider diagnostic carpal tunnel injection to see if it helped the symptoms in his hands.  If this was diagnostic.  Consider decompression treatment.     Meds & Orders: No orders of the defined types were placed in this encounter.      Orders Placed This Encounter  Procedures  . NCV with EMG (electromyography)    Follow-up: Return for Arther Abbott, MD.    Procedures: No procedures performed  EMG & NCV Findings: Evaluation of the left median motor and the right  median motor nerves showed prolonged distal onset latency (L4.5, R4.5 ms) and decreased conduction velocity (Elbow-Wrist, L45, R36 m/s).  The left ulnar motor and the right ulnar motor nerves showed decreased conduction velocity (B Elbow-Wrist, L39, R48 m/s).  The left median (across palm) sensory nerve showed no response (Wrist) and prolonged distal peak latency (Palm, 7.6 ms).  The right median (across palm) sensory nerve showed no response (Palm), prolonged distal peak latency (4.7 ms), and reduced amplitude (5.4 V).  The left radial sensory and the right radial sensory nerves showed no response (Wrist).  The left ulnar sensory and the right ulnar sensory nerves showed prolonged distal peak latency (L4.5, R4.0 ms), reduced amplitude (L8.2, R8.0 V), and decreased conduction velocity (Wrist-5th Digit, L31, R35 m/s).  Left vs. Right side comparison data for the ulnar sensory nerve indicates abnormal L-R latency difference (0.5 ms).  All remaining left vs. right side differences were within normal limits.     All examined muscles (as indicated in the following table) showed no evidence of electrical instability.     Impression: The above electrodiagnostic study is ABNORMAL and reveals evidence of sensory and axonal predominant sensorimotor demyelinating and axonal peripheral neuropathy of bilateral upper extremities.  There appears to be some involvement of the bilateral median nerves at the wrist which could be from a concomitant carpal tunnel entrapment.  There is more evidence of slowing on the motor nerves for the median nerve compared to the ulnar nerve. **Prior electrodiagnostic study by Reece Agar was consistent with peripheral polyneuropathy consistent with alcohol abuse.  There is no significant electrodiagnostic evidence of any other focal nerve entrapment, brachial plexopathy or cervical radiculopathy.    Recommendations: 1.  Follow-up with referring physician. 2.  Continue current  management of symptoms.  Could consider diagnostic carpal tunnel injection to see if it helped the symptoms in his hands.  If this was diagnostic.  Consider decompression treatment.   ___________________________ Wonda Olds Board Certified, American Board of Physical Medicine and Rehabilitation

## 2018-02-25 ENCOUNTER — Encounter: Payer: Self-pay | Admitting: Orthopedic Surgery

## 2018-02-25 ENCOUNTER — Ambulatory Visit: Payer: Medicaid Other | Admitting: Orthopedic Surgery

## 2018-02-27 ENCOUNTER — Encounter: Payer: Self-pay | Admitting: Orthopedic Surgery

## 2018-04-08 ENCOUNTER — Ambulatory Visit: Payer: Medicaid Other | Admitting: Orthopedic Surgery

## 2018-04-11 ENCOUNTER — Ambulatory Visit: Payer: Medicaid Other | Admitting: Orthopedic Surgery

## 2018-04-17 ENCOUNTER — Encounter (HOSPITAL_COMMUNITY): Admission: RE | Admit: 2018-04-17 | Payer: Self-pay | Source: Ambulatory Visit

## 2018-04-30 ENCOUNTER — Other Ambulatory Visit (HOSPITAL_COMMUNITY): Payer: Medicaid Other

## 2018-05-29 ENCOUNTER — Encounter (HOSPITAL_COMMUNITY): Payer: Self-pay

## 2018-05-29 ENCOUNTER — Other Ambulatory Visit: Payer: Self-pay

## 2018-05-29 ENCOUNTER — Inpatient Hospital Stay (HOSPITAL_COMMUNITY)
Admission: AD | Admit: 2018-05-29 | Discharge: 2018-05-31 | DRG: 483 | Disposition: A | Discharge status: Home Health | Payer: Medicaid Other | Source: Other Acute Inpatient Hospital | Attending: Internal Medicine | Admitting: Internal Medicine

## 2018-05-29 ENCOUNTER — Inpatient Hospital Stay (HOSPITAL_COMMUNITY): Payer: Medicaid Other | Admitting: Anesthesiology

## 2018-05-29 ENCOUNTER — Inpatient Hospital Stay (HOSPITAL_COMMUNITY): Payer: Medicaid Other

## 2018-05-29 ENCOUNTER — Encounter (HOSPITAL_COMMUNITY)
Admission: AD | Disposition: A | Discharge status: Home Health | Payer: Self-pay | Source: Other Acute Inpatient Hospital | Attending: Internal Medicine

## 2018-05-29 DIAGNOSIS — Z8249 Family history of ischemic heart disease and other diseases of the circulatory system: Secondary | ICD-10-CM

## 2018-05-29 DIAGNOSIS — K219 Gastro-esophageal reflux disease without esophagitis: Secondary | ICD-10-CM | POA: Diagnosis not present

## 2018-05-29 DIAGNOSIS — K7290 Hepatic failure, unspecified without coma: Secondary | ICD-10-CM | POA: Diagnosis present

## 2018-05-29 DIAGNOSIS — Z419 Encounter for procedure for purposes other than remedying health state, unspecified: Secondary | ICD-10-CM

## 2018-05-29 DIAGNOSIS — Z8673 Personal history of transient ischemic attack (TIA), and cerebral infarction without residual deficits: Secondary | ICD-10-CM

## 2018-05-29 DIAGNOSIS — Z7982 Long term (current) use of aspirin: Secondary | ICD-10-CM

## 2018-05-29 DIAGNOSIS — N179 Acute kidney failure, unspecified: Secondary | ICD-10-CM | POA: Diagnosis present

## 2018-05-29 DIAGNOSIS — W19XXXA Unspecified fall, initial encounter: Secondary | ICD-10-CM | POA: Diagnosis present

## 2018-05-29 DIAGNOSIS — Z888 Allergy status to other drugs, medicaments and biological substances status: Secondary | ICD-10-CM

## 2018-05-29 DIAGNOSIS — R55 Syncope and collapse: Secondary | ICD-10-CM | POA: Diagnosis present

## 2018-05-29 DIAGNOSIS — M109 Gout, unspecified: Secondary | ICD-10-CM | POA: Diagnosis not present

## 2018-05-29 DIAGNOSIS — S43005D Unspecified dislocation of left shoulder joint, subsequent encounter: Secondary | ICD-10-CM

## 2018-05-29 DIAGNOSIS — F1020 Alcohol dependence, uncomplicated: Secondary | ICD-10-CM | POA: Diagnosis not present

## 2018-05-29 DIAGNOSIS — S42202A Unspecified fracture of upper end of left humerus, initial encounter for closed fracture: Principal | ICD-10-CM | POA: Diagnosis present

## 2018-05-29 DIAGNOSIS — S42295G Other nondisplaced fracture of upper end of left humerus, subsequent encounter for fracture with delayed healing: Secondary | ICD-10-CM

## 2018-05-29 DIAGNOSIS — D62 Acute posthemorrhagic anemia: Secondary | ICD-10-CM | POA: Diagnosis not present

## 2018-05-29 DIAGNOSIS — W19XXXD Unspecified fall, subsequent encounter: Secondary | ICD-10-CM

## 2018-05-29 DIAGNOSIS — E876 Hypokalemia: Secondary | ICD-10-CM | POA: Diagnosis not present

## 2018-05-29 DIAGNOSIS — Z79899 Other long term (current) drug therapy: Secondary | ICD-10-CM | POA: Diagnosis not present

## 2018-05-29 DIAGNOSIS — Z7141 Alcohol abuse counseling and surveillance of alcoholic: Secondary | ICD-10-CM

## 2018-05-29 DIAGNOSIS — Z833 Family history of diabetes mellitus: Secondary | ICD-10-CM

## 2018-05-29 DIAGNOSIS — G621 Alcoholic polyneuropathy: Secondary | ICD-10-CM | POA: Diagnosis present

## 2018-05-29 DIAGNOSIS — F329 Major depressive disorder, single episode, unspecified: Secondary | ICD-10-CM | POA: Diagnosis present

## 2018-05-29 DIAGNOSIS — I10 Essential (primary) hypertension: Secondary | ICD-10-CM | POA: Diagnosis not present

## 2018-05-29 DIAGNOSIS — F17290 Nicotine dependence, other tobacco product, uncomplicated: Secondary | ICD-10-CM | POA: Diagnosis present

## 2018-05-29 DIAGNOSIS — Z808 Family history of malignant neoplasm of other organs or systems: Secondary | ICD-10-CM | POA: Diagnosis not present

## 2018-05-29 DIAGNOSIS — E7800 Pure hypercholesterolemia, unspecified: Secondary | ICD-10-CM | POA: Diagnosis present

## 2018-05-29 DIAGNOSIS — R945 Abnormal results of liver function studies: Secondary | ICD-10-CM | POA: Diagnosis present

## 2018-05-29 DIAGNOSIS — E785 Hyperlipidemia, unspecified: Secondary | ICD-10-CM | POA: Diagnosis present

## 2018-05-29 DIAGNOSIS — Z09 Encounter for follow-up examination after completed treatment for conditions other than malignant neoplasm: Secondary | ICD-10-CM

## 2018-05-29 DIAGNOSIS — S43005A Unspecified dislocation of left shoulder joint, initial encounter: Secondary | ICD-10-CM | POA: Diagnosis present

## 2018-05-29 DIAGNOSIS — G8929 Other chronic pain: Secondary | ICD-10-CM | POA: Diagnosis present

## 2018-05-29 DIAGNOSIS — R7989 Other specified abnormal findings of blood chemistry: Secondary | ICD-10-CM | POA: Diagnosis present

## 2018-05-29 DIAGNOSIS — Z885 Allergy status to narcotic agent status: Secondary | ICD-10-CM | POA: Diagnosis not present

## 2018-05-29 DIAGNOSIS — R140 Abdominal distension (gaseous): Secondary | ICD-10-CM

## 2018-05-29 DIAGNOSIS — Z86010 Personal history of colonic polyps: Secondary | ICD-10-CM

## 2018-05-29 DIAGNOSIS — G459 Transient cerebral ischemic attack, unspecified: Secondary | ICD-10-CM | POA: Diagnosis present

## 2018-05-29 DIAGNOSIS — F1010 Alcohol abuse, uncomplicated: Secondary | ICD-10-CM | POA: Diagnosis present

## 2018-05-29 DIAGNOSIS — Z823 Family history of stroke: Secondary | ICD-10-CM | POA: Diagnosis not present

## 2018-05-29 HISTORY — PX: REVERSE SHOULDER ARTHROPLASTY: SHX5054

## 2018-05-29 LAB — BASIC METABOLIC PANEL
ANION GAP: 7 (ref 5–15)
BUN: 6 mg/dL (ref 6–20)
CO2: 24 mmol/L (ref 22–32)
Calcium: 8.5 mg/dL — ABNORMAL LOW (ref 8.9–10.3)
Chloride: 106 mmol/L (ref 98–111)
Creatinine, Ser: 1.22 mg/dL (ref 0.61–1.24)
GFR calc Af Amer: >60 mL/min (ref >60)
Glucose, Bld: 115 mg/dL — ABNORMAL HIGH (ref 70–99)
POTASSIUM: 3.2 mmol/L — AB (ref 3.5–5.1)
Sodium: 137 mmol/L (ref 135–145)

## 2018-05-29 LAB — MRSA PCR SCREENING: MRSA by PCR: NEGATIVE

## 2018-05-29 LAB — CBC
HCT: 23.5 % — ABNORMAL LOW (ref 39.0–52.0)
HEMOGLOBIN: 8.3 g/dL — AB (ref 13.0–17.0)
MCH: 32.8 pg (ref 26.0–34.0)
MCHC: 35.3 g/dL (ref 30.0–36.0)
MCV: 92.9 fL (ref 80.0–100.0)
Platelets: 128 10*3/uL — ABNORMAL LOW (ref 150–400)
RBC: 2.53 MIL/uL — ABNORMAL LOW (ref 4.22–5.81)
RDW: 13.2 % (ref 11.5–15.5)
WBC: 11.8 10*3/uL — ABNORMAL HIGH (ref 4.0–10.5)
nRBC: 0 % (ref 0.0–0.2)

## 2018-05-29 LAB — PROTIME-INR
INR: 1.2 (ref 0.8–1.2)
PROTHROMBIN TIME: 14.6 s (ref 11.4–15.2)

## 2018-05-29 LAB — APTT: APTT: 32 s (ref 24–36)

## 2018-05-29 LAB — ABO/RH: ABO/RH(D): O POS

## 2018-05-29 LAB — HIV ANTIBODY (ROUTINE TESTING W REFLEX): HIV Screen 4th Generation wRfx: NONREACTIVE

## 2018-05-29 LAB — MAGNESIUM: Magnesium: 1.8 mg/dL (ref 1.7–2.4)

## 2018-05-29 SURGERY — ARTHROPLASTY, SHOULDER, TOTAL, REVERSE
Anesthesia: General | Laterality: Left

## 2018-05-29 MED ORDER — ROCURONIUM BROMIDE 10 MG/ML (PF) SYRINGE
PREFILLED_SYRINGE | INTRAVENOUS | Status: DC | PRN
  Administered 2018-05-29: 20 mg via INTRAVENOUS
  Administered 2018-05-29: 10 mg via INTRAVENOUS
  Administered 2018-05-29: 30 mg via INTRAVENOUS
  Administered 2018-05-29: 50 mg via INTRAVENOUS
  Administered 2018-05-29: 20 mg via INTRAVENOUS

## 2018-05-29 MED ORDER — LORAZEPAM 1 MG PO TABS: 1.0000 mg | ORAL_TABLET | Freq: Four times a day (QID) | ORAL | Status: DC | PRN

## 2018-05-29 MED ORDER — LORAZEPAM 2 MG/ML IJ SOLN
0.0000 mg | Freq: Four times a day (QID) | INTRAMUSCULAR | Status: AC
  Administered 2018-05-29: 4 mg via INTRAVENOUS
  Administered 2018-05-29 – 2018-05-30 (×3): 2 mg via INTRAVENOUS
  Administered 2018-05-30: 1 mg via INTRAVENOUS
  Administered 2018-05-30: 2 mg via INTRAVENOUS
  Filled 2018-05-29: qty 2
  Filled 2018-05-29 (×2): qty 1
  Filled 2018-05-29: qty 2
  Filled 2018-05-29 (×2): qty 1

## 2018-05-29 MED ORDER — CHLORHEXIDINE GLUCONATE 4 % EX LIQD
60.0000 mL | Freq: Once | CUTANEOUS | Status: DC
  Administered 2018-05-29: 4 via TOPICAL
  Filled 2018-05-29 (×2): qty 60

## 2018-05-29 MED ORDER — OXYCODONE HCL 5 MG PO TABS
5.0000 mg | ORAL_TABLET | ORAL | Status: DC | PRN
  Administered 2018-05-31: 5 mg via ORAL
  Filled 2018-05-29: qty 1

## 2018-05-29 MED ORDER — OXYCODONE HCL 5 MG PO TABS: 10.0000 mg | ORAL_TABLET | ORAL | Status: DC | PRN

## 2018-05-29 MED ORDER — LIDOCAINE 2% (20 MG/ML) 5 ML SYRINGE
INTRAMUSCULAR | Status: DC | PRN
  Administered 2018-05-29: 40 mg via INTRAVENOUS

## 2018-05-29 MED ORDER — VITAMIN B-12 1000 MCG PO TABS
1000.0000 ug | ORAL_TABLET | Freq: Every day | ORAL | Status: DC
  Administered 2018-05-29 – 2018-05-31 (×3): 1000 ug via ORAL
  Filled 2018-05-29 (×3): qty 1

## 2018-05-29 MED ORDER — THIAMINE HCL 100 MG/ML IJ SOLN
100.0000 mg | Freq: Every day | INTRAMUSCULAR | Status: DC
  Filled 2018-05-29: qty 2

## 2018-05-29 MED ORDER — SUGAMMADEX SODIUM 200 MG/2ML IV SOLN
INTRAVENOUS | Status: DC | PRN
  Administered 2018-05-29: 161.6 mg via INTRAVENOUS

## 2018-05-29 MED ORDER — METOPROLOL TARTRATE 25 MG PO TABS
25.0000 mg | ORAL_TABLET | Freq: Two times a day (BID) | ORAL | Status: DC
  Administered 2018-05-29 – 2018-05-31 (×5): 25 mg via ORAL
  Filled 2018-05-29 (×6): qty 1

## 2018-05-29 MED ORDER — SODIUM CHLORIDE 0.9 % IR SOLN
Status: DC | PRN
  Administered 2018-05-29: 3000 mL

## 2018-05-29 MED ORDER — ACETAMINOPHEN 500 MG PO TABS
1000.0000 mg | ORAL_TABLET | Freq: Three times a day (TID) | ORAL | Status: DC
  Administered 2018-05-30 (×2): 1000 mg via ORAL
  Filled 2018-05-29 (×3): qty 2

## 2018-05-29 MED ORDER — OXYCODONE-ACETAMINOPHEN 5-325 MG PO TABS: 1.0000 | ORAL_TABLET | ORAL | Status: DC | PRN

## 2018-05-29 MED ORDER — LACTATED RINGERS IV SOLN
INTRAVENOUS | Status: DC
  Administered 2018-05-29: 15:00:00 via INTRAVENOUS
  Administered 2018-05-29: 50 mL/h via INTRAVENOUS

## 2018-05-29 MED ORDER — SODIUM CHLORIDE 0.9 % IV SOLN
INTRAVENOUS | Status: DC | PRN
  Administered 2018-05-29: 25 ug/min via INTRAVENOUS

## 2018-05-29 MED ORDER — TOBRAMYCIN SULFATE 1.2 G IJ SOLR
INTRAMUSCULAR | Status: DC | PRN
  Administered 2018-05-29: 1.2 g

## 2018-05-29 MED ORDER — SENNOSIDES-DOCUSATE SODIUM 8.6-50 MG PO TABS: 1.0000 | ORAL_TABLET | Freq: Every evening | ORAL | Status: DC | PRN

## 2018-05-29 MED ORDER — VANCOMYCIN HCL 1000 MG IV SOLR
1000.0000 mg | Freq: Two times a day (BID) | INTRAVENOUS | Status: DC
  Administered 2018-05-29 – 2018-05-31 (×4): 1000 mg via INTRAVENOUS
  Filled 2018-05-29 (×5): qty 1000

## 2018-05-29 MED ORDER — FENTANYL CITRATE (PF) 100 MCG/2ML IJ SOLN: 25.0000 ug | INTRAMUSCULAR | Status: DC | PRN

## 2018-05-29 MED ORDER — ONDANSETRON HCL 4 MG/2ML IJ SOLN: 4.0000 mg | Freq: Four times a day (QID) | INTRAMUSCULAR | Status: DC | PRN

## 2018-05-29 MED ORDER — MIDAZOLAM HCL 5 MG/5ML IJ SOLN
INTRAMUSCULAR | Status: DC | PRN
  Administered 2018-05-29: 2 mg via INTRAVENOUS

## 2018-05-29 MED ORDER — METHOCARBAMOL 500 MG PO TABS: 500.0000 mg | ORAL_TABLET | Freq: Three times a day (TID) | ORAL | Status: DC | PRN

## 2018-05-29 MED ORDER — ALBUMIN HUMAN 5 % IV SOLN
INTRAVENOUS | Status: DC | PRN
  Administered 2018-05-29: 15:00:00 via INTRAVENOUS

## 2018-05-29 MED ORDER — VANCOMYCIN HCL IN DEXTROSE 1-5 GM/200ML-% IV SOLN
INTRAVENOUS | Status: AC
  Administered 2018-05-29: 1000 mg
  Filled 2018-05-29: qty 200

## 2018-05-29 MED ORDER — PROPOFOL 10 MG/ML IV BOLUS
INTRAVENOUS | Status: DC | PRN
  Administered 2018-05-29: 150 mg via INTRAVENOUS

## 2018-05-29 MED ORDER — ONDANSETRON HCL 4 MG/2ML IJ SOLN
INTRAMUSCULAR | Status: DC | PRN
  Administered 2018-05-29: 4 mg via INTRAVENOUS

## 2018-05-29 MED ORDER — HYDRALAZINE HCL 20 MG/ML IJ SOLN: 5.0000 mg | INTRAMUSCULAR | Status: DC | PRN

## 2018-05-29 MED ORDER — DEXAMETHASONE SODIUM PHOSPHATE 10 MG/ML IJ SOLN
INTRAMUSCULAR | Status: AC
  Filled 2018-05-29: qty 1

## 2018-05-29 MED ORDER — TRANEXAMIC ACID-NACL 1000-0.7 MG/100ML-% IV SOLN
1000.0000 mg | INTRAVENOUS | Status: AC
  Administered 2018-05-29: 1000 mg via INTRAVENOUS
  Filled 2018-05-29: qty 100

## 2018-05-29 MED ORDER — FENTANYL CITRATE (PF) 250 MCG/5ML IJ SOLN
INTRAMUSCULAR | Status: DC | PRN
  Administered 2018-05-29: 50 ug via INTRAVENOUS
  Administered 2018-05-29: 100 ug via INTRAVENOUS

## 2018-05-29 MED ORDER — CEFAZOLIN SODIUM-DEXTROSE 1-4 GM/50ML-% IV SOLN
1.0000 g | Freq: Three times a day (TID) | INTRAVENOUS | Status: AC
  Administered 2018-05-29 – 2018-05-30 (×2): 1 g via INTRAVENOUS
  Filled 2018-05-29 (×2): qty 50

## 2018-05-29 MED ORDER — EPHEDRINE SULFATE 50 MG/ML IJ SOLN
INTRAMUSCULAR | Status: DC | PRN
  Administered 2018-05-29: 10 mg via INTRAVENOUS

## 2018-05-29 MED ORDER — LORAZEPAM 2 MG/ML IJ SOLN
0.0000 mg | Freq: Two times a day (BID) | INTRAMUSCULAR | Status: DC
  Filled 2018-05-29: qty 1

## 2018-05-29 MED ORDER — VANCOMYCIN HCL 1000 MG IV SOLR
INTRAVENOUS | Status: AC
  Filled 2018-05-29: qty 1000

## 2018-05-29 MED ORDER — DEXAMETHASONE SODIUM PHOSPHATE 10 MG/ML IJ SOLN
INTRAMUSCULAR | Status: DC | PRN
  Administered 2018-05-29: 10 mg via INTRAVENOUS

## 2018-05-29 MED ORDER — MORPHINE SULFATE (PF) 2 MG/ML IV SOLN
2.0000 mg | INTRAVENOUS | Status: DC | PRN
  Administered 2018-05-29: 2 mg via INTRAVENOUS
  Filled 2018-05-29: qty 1

## 2018-05-29 MED ORDER — FENTANYL CITRATE (PF) 250 MCG/5ML IJ SOLN
INTRAMUSCULAR | Status: AC
  Filled 2018-05-29: qty 5

## 2018-05-29 MED ORDER — TOBRAMYCIN SULFATE 1.2 G IJ SOLR
INTRAMUSCULAR | Status: AC
  Filled 2018-05-29: qty 1.2

## 2018-05-29 MED ORDER — ONDANSETRON HCL 4 MG PO TABS: 4.0000 mg | ORAL_TABLET | Freq: Four times a day (QID) | ORAL | Status: DC | PRN

## 2018-05-29 MED ORDER — FENTANYL CITRATE (PF) 100 MCG/2ML IJ SOLN
INTRAMUSCULAR | Status: AC
  Administered 2018-05-29: 50 ug
  Filled 2018-05-29: qty 2

## 2018-05-29 MED ORDER — ADULT MULTIVITAMIN W/MINERALS CH
1.0000 | ORAL_TABLET | Freq: Every day | ORAL | Status: DC
  Administered 2018-05-29 – 2018-05-31 (×3): 1 via ORAL
  Filled 2018-05-29 (×3): qty 1

## 2018-05-29 MED ORDER — VANCOMYCIN HCL 1000 MG IV SOLR: 1000.0000 mg | INTRAVENOUS | Status: DC

## 2018-05-29 MED ORDER — CEFAZOLIN SODIUM-DEXTROSE 2-4 GM/100ML-% IV SOLN
2.0000 g | INTRAVENOUS | Status: AC
  Administered 2018-05-29: 2 g via INTRAVENOUS
  Filled 2018-05-29: qty 100

## 2018-05-29 MED ORDER — ONDANSETRON HCL 4 MG/2ML IJ SOLN: 4.0000 mg | Freq: Once | INTRAMUSCULAR | Status: DC | PRN

## 2018-05-29 MED ORDER — MIDAZOLAM HCL 2 MG/2ML IJ SOLN: 2.0000 mg | Freq: Once | INTRAMUSCULAR | Status: DC

## 2018-05-29 MED ORDER — VANCOMYCIN HCL 1000 MG IV SOLR
INTRAVENOUS | Status: DC | PRN
  Administered 2018-05-29: 1000 mg

## 2018-05-29 MED ORDER — DEXMEDETOMIDINE HCL 200 MCG/2ML IV SOLN
INTRAVENOUS | Status: DC | PRN
  Administered 2018-05-29: 12 ug via INTRAVENOUS
  Administered 2018-05-29: 16 ug via INTRAVENOUS
  Administered 2018-05-29: 12 ug via INTRAVENOUS

## 2018-05-29 MED ORDER — SODIUM CHLORIDE 0.9 % IV SOLN
INTRAVENOUS | Status: DC
  Administered 2018-05-29: 03:00:00 via INTRAVENOUS

## 2018-05-29 MED ORDER — MIDAZOLAM HCL 2 MG/2ML IJ SOLN
INTRAMUSCULAR | Status: AC
  Filled 2018-05-29: qty 2

## 2018-05-29 MED ORDER — PANTOPRAZOLE SODIUM 40 MG PO TBEC
40.0000 mg | DELAYED_RELEASE_TABLET | Freq: Every day | ORAL | Status: DC
  Administered 2018-05-29 – 2018-05-31 (×3): 40 mg via ORAL
  Filled 2018-05-29 (×3): qty 1

## 2018-05-29 MED ORDER — ARTIFICIAL TEARS OPHTHALMIC OINT
TOPICAL_OINTMENT | OPHTHALMIC | Status: AC
  Filled 2018-05-29: qty 3.5

## 2018-05-29 MED ORDER — CELECOXIB 200 MG PO CAPS
200.0000 mg | ORAL_CAPSULE | Freq: Every day | ORAL | Status: DC
  Administered 2018-05-29 – 2018-05-31 (×3): 200 mg via ORAL
  Filled 2018-05-29 (×3): qty 1

## 2018-05-29 MED ORDER — MIDAZOLAM HCL 2 MG/2ML IJ SOLN
INTRAMUSCULAR | Status: AC
  Administered 2018-05-29: 1 mg
  Filled 2018-05-29: qty 2

## 2018-05-29 MED ORDER — POTASSIUM CHLORIDE 20 MEQ/15ML (10%) PO SOLN
40.0000 meq | Freq: Once | ORAL | Status: AC
  Administered 2018-05-29: 40 meq via ORAL
  Filled 2018-05-29: qty 30

## 2018-05-29 MED ORDER — SODIUM CHLORIDE 0.9 % IV SOLN: INTRAVENOUS | Status: AC

## 2018-05-29 MED ORDER — 0.9 % SODIUM CHLORIDE (POUR BTL) OPTIME
TOPICAL | Status: DC | PRN
  Administered 2018-05-29: 1000 mL

## 2018-05-29 MED ORDER — ASPIRIN EC 81 MG PO TBEC
162.0000 mg | DELAYED_RELEASE_TABLET | Freq: Every day | ORAL | Status: DC
  Administered 2018-05-29 – 2018-05-31 (×3): 162 mg via ORAL
  Filled 2018-05-29 (×3): qty 2

## 2018-05-29 MED ORDER — ONDANSETRON HCL 4 MG/2ML IJ SOLN
INTRAMUSCULAR | Status: AC
  Filled 2018-05-29: qty 2

## 2018-05-29 MED ORDER — FOLIC ACID 1 MG PO TABS
1.0000 mg | ORAL_TABLET | Freq: Every day | ORAL | Status: DC
  Administered 2018-05-29 – 2018-05-31 (×3): 1 mg via ORAL
  Filled 2018-05-29 (×3): qty 1

## 2018-05-29 MED ORDER — VITAMIN B-1 100 MG PO TABS
100.0000 mg | ORAL_TABLET | Freq: Every day | ORAL | Status: DC
  Administered 2018-05-29 – 2018-05-31 (×3): 100 mg via ORAL
  Filled 2018-05-29 (×3): qty 1

## 2018-05-29 MED ORDER — LORAZEPAM 2 MG/ML IJ SOLN
1.0000 mg | Freq: Four times a day (QID) | INTRAMUSCULAR | Status: DC | PRN
  Administered 2018-05-30: 1 mg via INTRAVENOUS
  Filled 2018-05-29 (×2): qty 1

## 2018-05-29 SURGICAL SUPPLY — 71 items
AID PSTN UNV HD RSTRNT DISP (MISCELLANEOUS) ×1
BASEPLATE GLENOSPHERE 25 STD (Miscellaneous) ×1 IMPLANT
BIT DRILL 3.2 PERIPHERAL SCREW (BIT) ×1 IMPLANT
BLADE SAW SAG 73X25 THK (BLADE) ×1
BLADE SAW SGTL 73X25 THK (BLADE) ×1 IMPLANT
BSPLAT GLND STD 25 RVRS SHLDR (Miscellaneous) ×1 IMPLANT
CANISTER WOUNDNEG PRESSURE 500 (CANNISTER) ×1 IMPLANT
CAP LOCKING COCR (Cap) ×1 IMPLANT
CHLORAPREP W/TINT 26ML (MISCELLANEOUS) ×4 IMPLANT
COVER SURGICAL LIGHT HANDLE (MISCELLANEOUS) ×2 IMPLANT
COVER WAND RF STERILE (DRAPES) ×2 IMPLANT
DRAPE C-ARM 42X72 X-RAY (DRAPES) IMPLANT
DRAPE HALF SHEET 40X57 (DRAPES) ×2 IMPLANT
DRAPE INCISE IOBAN 66X45 STRL (DRAPES) ×4 IMPLANT
DRAPE ORTHO SPLIT 77X108 STRL (DRAPES) ×4
DRAPE SURG ORHT 6 SPLT 77X108 (DRAPES) ×2 IMPLANT
DRAPE SWITCH (DRAPES) ×2 IMPLANT
DRAPE U-SHAPE 47X51 STRL (DRAPES) IMPLANT
DRSG AQUACEL AG ADV 3.5X 6 (GAUZE/BANDAGES/DRESSINGS) ×2 IMPLANT
ELECT REM PT RETURN 9FT ADLT (ELECTROSURGICAL) ×2
ELECTRODE REM PT RTRN 9FT ADLT (ELECTROSURGICAL) ×1 IMPLANT
GLENOSPHERE STANDARD 39 (Joint) ×2 IMPLANT
GLENOSPHERE STD 39 (Joint) IMPLANT
GLOVE BIOGEL PI IND STRL 8 (GLOVE) ×1 IMPLANT
GLOVE BIOGEL PI INDICATOR 8 (GLOVE) ×1
GLOVE ECLIPSE 8.0 STRL XLNG CF (GLOVE) ×4 IMPLANT
GOWN STRL REUS W/ TWL LRG LVL3 (GOWN DISPOSABLE) ×1 IMPLANT
GOWN STRL REUS W/ TWL XL LVL3 (GOWN DISPOSABLE) ×1 IMPLANT
GOWN STRL REUS W/TWL LRG LVL3 (GOWN DISPOSABLE) ×2
GOWN STRL REUS W/TWL XL LVL3 (GOWN DISPOSABLE) ×2
GUIDEWIRE GLENOID 2.5X220 (WIRE) ×1 IMPLANT
HANDPIECE INTERPULSE COAX TIP (DISPOSABLE) ×2
IMPL PROX BODY PTC 15X132 (Stem) IMPLANT
IMPL REVERSE SHOULDER 0X3.5 (Shoulder) IMPLANT
IMPLANT PROX BODY PTC 15X132 (Stem) ×2 IMPLANT
IMPLANT REVERSE SHOULDER 0X3.5 (Shoulder) ×2 IMPLANT
INSERT REV KIT SHOULDER 6X39 (Screw) ×1 IMPLANT
KIT BASIN OR (CUSTOM PROCEDURE TRAY) ×2 IMPLANT
KIT PREVENA INCISION MGT 13 (CANNISTER) ×1 IMPLANT
KIT STABILIZATION SHOULDER (MISCELLANEOUS) ×2 IMPLANT
KIT TURNOVER KIT B (KITS) ×2 IMPLANT
MANIFOLD NEPTUNE II (INSTRUMENTS) ×2 IMPLANT
NDL HYPO 25GX1X1/2 BEV (NEEDLE) IMPLANT
NDL MAYO TROCAR (NEEDLE) IMPLANT
NEEDLE HYPO 25GX1X1/2 BEV (NEEDLE) IMPLANT
NEEDLE MAYO TROCAR (NEEDLE) IMPLANT
NS IRRIG 1000ML POUR BTL (IV SOLUTION) ×2 IMPLANT
PACK SHOULDER (CUSTOM PROCEDURE TRAY) ×2 IMPLANT
PAD ARMBOARD 7.5X6 YLW CONV (MISCELLANEOUS) ×4 IMPLANT
RESTRAINT HEAD UNIVERSAL NS (MISCELLANEOUS) ×2 IMPLANT
SCREW 5.0X38 SMALL F/PERFORM (Screw) ×1 IMPLANT
SCREW ASSEMBLY COCR TYPE 0 (Screw) ×1 IMPLANT
SCREW CENTRAL THREAD 6.5X45 (Screw) ×1 IMPLANT
SCREW PERIPHERAL 42 (Screw) ×1 IMPLANT
SET HNDPC FAN SPRY TIP SCT (DISPOSABLE) ×1 IMPLANT
SPONGE LAP 18X18 RF (DISPOSABLE) ×2 IMPLANT
STEM PTC DISTAL 15X90 (Stem) ×1 IMPLANT
STRIP CLOSURE SKIN 1/2X4 (GAUZE/BANDAGES/DRESSINGS) ×2 IMPLANT
SUCTION FRAZIER HANDLE 10FR (MISCELLANEOUS)
SUCTION TUBE FRAZIER 10FR DISP (MISCELLANEOUS) IMPLANT
SUT ETHIBOND 2 V 37 (SUTURE) ×2 IMPLANT
SUT ETHIBOND NAB CT1 #1 30IN (SUTURE) ×2 IMPLANT
SUT ETHILON 3 0 FSL (SUTURE) ×1 IMPLANT
SUT FIBERWIRE #5 38 CONV NDL (SUTURE) ×8
SUT MNCRL AB 3-0 PS2 18 (SUTURE) ×2 IMPLANT
SUT VIC AB 2-0 CT1 27 (SUTURE) ×2
SUT VIC AB 2-0 CT1 TAPERPNT 27 (SUTURE) ×1 IMPLANT
SUTURE FIBERWR #5 38 CONV NDL (SUTURE) ×4 IMPLANT
TOWEL OR 17X26 10 PK STRL BLUE (TOWEL DISPOSABLE) ×2 IMPLANT
TRAY FOLEY W/BAG SLVR 14FR (SET/KITS/TRAYS/PACK) IMPLANT
WATER STERILE IRR 1000ML POUR (IV SOLUTION) ×2 IMPLANT

## 2018-05-29 NOTE — H&P (View-Only) (Signed)
ORTHOPAEDIC CONSULTATION  REQUESTING PHYSICIAN: Damita Lack, MD  Chief Complaint: Left proximal humeral fracture dislocation  HPI: Tristan Cook is a 58 y.o. male who sustained a fall 4 days ago and resultant fracture dislocation of the proximal humerus.  He was at [facility] and a provider there attempted an open reduction but failed to get the joint reduced.  He then was closed and a dressing was applied and he was sent to [facility] for further evaluation.  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  He is functional and lives on his own but he is disabled.  Past Medical History:  Diagnosis Date  . Abdominal pain   . Adenomatous colon polyp 2008  . Arthritis   . Back pain   . Bilateral lumbar radiculopathy   . Blurred vision, bilateral   . Chronic back pain   . Constipation   . Depression   . GERD (gastroesophageal reflux disease)   . Gout   . Hemorrhoids   . High cholesterol   . Hypertension   . Neck pain   . Neuropathy of both feet   . Numbness    rectal  . Numbness of legs   . Stroke ([place])   . Substance abuse ([place])    h/o excessive alcohol use; pt says he limits use to one to 2 beers daily he currently   Past Surgical History:  Procedure Laterality Date  . COLONOSCOPY  10/09/2006   3 mm pedunculated sigmoid colon polyp removed/8-mm sessile hepatic flexure polyp (tubular villous adenoma) removed/6-mm  descending colon polyp removed/small internal hemorrhoids  . COLONOSCOPY  02/28/2011   [id number], multiple in the rectum/Internal hemorrhoids, MODERATE-CAUSING RECTAL BLEEDING  . COLONOSCOPY N/A 07/05/2016   Procedure: COLONOSCOPY;  Surgeon: [name], MD;  [facility];  Service: Endoscopy;  Laterality: N/A;  11:15am  . 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 Mild erosive gastrtitis most likely due to indomethacin   . FINGER SURGERY Right    4th and 5th digit  . FLEXIBLE SIGMOIDOSCOPY  01/02/2012   SLF: 1. the colonic mucosa appeared normal in the sigmoid colon 2. Large internal hemorrhoids: cause for rectal bleeding/pain . s/p banding X 3   . HAND SURGERY    . SAVORY DILATION N/A 07/21/2014   Procedure: SAVORY DILATION;  Surgeon: [name], MD;  [facility];  Service: Endoscopy;  Laterality: N/A;   Social History   Socioeconomic History  . Marital status: Single    Spouse name: Not on file  . Number of children: 0  . Years of education: 48  . Highest education level: Not on file  Occupational History  . Occupation: unemployed, Retail buyer: UNEMPLOYED  Social Needs  . Financial resource strain: Not on file  . Food insecurity:    Worry: Not on file    Inability: Not on file  . Transportation needs:    Medical: No    Non-medical: No  Tobacco Use  . Smoking status: Former Smoker    Packs/day: 1.50    Years: 33.00    Pack years: 49.50    Types: Cigarettes    Last attempt to quit: 08/16/2004    Years since quitting: 13.7  . Smokeless tobacco: Current User    Types: Snuff  .  Tobacco comment: Dips every once in a while  Substance and Sexual Activity  . Alcohol use: Yes    Alcohol/week: 3.0 standard drinks    Types: 3 Cans of beer per week    Comment: drinks 6 pack of beer on weekends  . Drug use: No  . Sexual activity: Not on file  Lifestyle  . Physical activity:    Days per week: Not on file    Minutes per session: Not on file  . Stress: Not on file  Relationships  . Social connections:    Talks on phone: Not on file    Gets together: Not on file    Attends religious service: Not on file    Active member of club or organization: Not on file    Attends meetings of clubs or organizations: Not on file    Relationship status: Not on file  Other Topics Concern  . Not  on file  Social History Narrative   Lives alone   No caffeine   Right handed   Family History  Problem Relation Age of Onset  . Hypertension Mother   . Hypertension Father   . Stroke Father   . Hypertension Sister   . Hypertension Brother   . Cancer Brother   . Cancer Brother        throat cancer  . Hypertension Brother   . Diabetes Maternal Grandmother   . Colon cancer Neg Hx    Allergies  Allergen Reactions  . Hydrocodone     hallucinations from hydrocodone  . Benadryl [Diphenhydramine Hcl (Sleep)] Other (See Comments)    Palpitations   Prior to Admission medications   Medication Sig Start Date End Date Taking? Authorizing Provider  aspirin EC 81 MG tablet Take 162 mg by mouth daily.     [provider]  FLUoxetine (PROZAC) 20 MG tablet Take 1 tablet (20 mg total) by mouth daily. Patient not taking: Reported on 04/05/2018 04/26/17   Soyla Dryer, PA-C  folic acid (FOLVITE) 867 MCG tablet Take 400 mcg by mouth daily.    [provider]  Glycerin-Hypromellose-PEG 400 (VISINE PURE TEARS OP) Place 1 drop into both eyes 3 (three) times daily as needed (dry eyes).    [provider]  metoprolol tartrate (LOPRESSOR) 25 MG tablet Take 1 tablet (25 mg total) by mouth 2 (two) times daily. Patient not taking: Reported on 04/05/2018 02/14/17 10/17/17  Rolland Porter, MD  metoprolol tartrate (LOPRESSOR) 50 MG tablet Take 25 mg by mouth 2 (two) times daily.    [provider]  Multiple Vitamin (MULTIVITAMIN WITH MINERALS) TABS tablet Take 1 tablet by mouth daily. 04/12/17   Johnson, Clanford L, MD  omeprazole (PRILOSEC) 20 MG capsule Take 1 capsule (20 mg total) by mouth daily. Patient not taking: Reported on 04/05/2018 10/17/17   Carlis Stable, NP  thiamine 100 MG tablet Take 1 tablet (100 mg total) by mouth daily. Patient not taking: Reported on 04/05/2018 04/12/17   Murlean Iba, MD  vitamin B-12 (CYANOCOBALAMIN) 1000 MCG tablet Take 1,000 mcg by mouth  daily.    [provider]  vitamin E 400 UNIT capsule Take 400 Units by mouth daily.    [provider]   No results found. Family History Reviewed and non-contributory, no pertinent history of problems with bleeding or anesthesia      Review of Systems 14 system ROS conducted and negative except for that noted in HPI   OBJECTIVE  Vitals: Patient Vitals  for the past 8 hrs:  BP Temp Temp src Pulse Resp SpO2 Height Weight  05/29/18 0601 (!) 133/91 97.7 F (36.5 C) Oral 90 18 100 % - -  05/29/18 0349 - - - (!) 0 - - - -  05/29/18 0100 130/80 98 F (36.7 C) Oral (!) 102 18 100 % '6\' 2"'$  (1.88 m) 80.8 kg   General: Alert, no acute distress Cardiovascular: Warm extremities noted Respiratory: No cyanosis, no use of accessory musculature GI: No organomegaly, abdomen is soft and non-tender Skin: No lesions in the area of chief complaint other than those listed below in MSK exam.  Neurologic:Sensation intact distally save for the below mentioned MSK exam Psychiatric: Patient is competent for consent with normal mood and affect Lymphatic: No swelling obvious and reported other than the area involved in the exam below Extremities  Left upper extremity: Distal radial nerve function appears to be intact but ulnar and AIN function weak.  He is able to make a fist.  Warm well perfused hand.  His axillary nerve function is questionable and weak at best.    Test Results Imaging CT reviewed demonstrating a head split fracture with dislocation of the head into the subcoracoid region.  Comminution of the tuberosities and relatively distal fracture line noted.  Labs cbc Recent Labs    05/29/18 0329  WBC 11.8*  HGB 8.3*  HCT 23.5*  PLT 128*    Labs inflam No results for input(s): CRP in the last 72 hours.  Invalid input(s): ESR  Labs coag Recent Labs    05/29/18 0329  INR 1.2    Recent Labs    05/29/18 0329  NA 137  K 3.2*  CL 106  CO2 24  GLUCOSE 115*   BUN 6  CREATININE 1.22  CALCIUM 8.5*     ASSESSMENT AND PLAN: 58 y.o. male with the following: Chronic left fracture dislocation with failed attempted open reduction at outside hospital.  Patient has a complex problem that will require surgery.  Based on his fracture dislocation appearance of the CT we do not feel that he would be appropriate for an attempted ORIF as this will predictably fail.  He is physiologically older than his stated age of 28 and we feel that the best option for him is a reverse total shoulder arthroplasty.  We feel that he has a significant risk of infection as well as dislocation with this previous open reduction attempt.  Talked about nerve damage and chronic issues with the shoulder as well as longevity of the components.  If he has glenoid damage that is not appreciated or other issues that are worrisome we may end up transitioning to a hemiarthroplasty instead of a reverse total shoulder arthroplasty.  He understands that this is a long recovery and therapy will be necessary for an optimal outcome.  He is limited in his ability to perform therapy as he does not have insurance currently.  All this said we still feel that his best option would be arthroplasty.  We will work on urgent scheduling in the setting of this fracture dislocation with neurologic compromise distally.  We additionally specifically discussed risks of axillary nerve injury, infection, periprosthetic fracture, continued pain and longevity of implants prior to beginning procedure.

## 2018-05-29 NOTE — Progress Notes (Signed)
Pt noted to have more swelling the the Left breast and shoulder and arm more swelling and the are where the wound vac is very redden and seems to have spread. This nurse marked the spot and notified On-call orthopedic surgeon of [facility] orthopedic specialist. Will evaluate in the morning.

## 2018-05-29 NOTE — Consult Note (Signed)
ORTHOPAEDIC CONSULTATION  REQUESTING PHYSICIAN: Damita Lack, MD  Chief Complaint: Left proximal humeral fracture dislocation  HPI: Tristan Cook is a 58 y.o. male who sustained a fall 4 days ago and resultant fracture dislocation of the proximal humerus.  He was at [facility] and a provider there attempted an open reduction but failed to get the joint reduced.  He then was closed and a dressing was applied and he was sent to [facility] for further evaluation.  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  He is functional and lives on his own but he is disabled.  Past Medical History:  Diagnosis Date  . Abdominal pain   . Adenomatous colon polyp 2008  . Arthritis   . Back pain   . Bilateral lumbar radiculopathy   . Blurred vision, bilateral   . Chronic back pain   . Constipation   . Depression   . GERD (gastroesophageal reflux disease)   . Gout   . Hemorrhoids   . High cholesterol   . Hypertension   . Neck pain   . Neuropathy of both feet   . Numbness    rectal  . Numbness of legs   . Stroke ([place])   . Substance abuse ([place])    h/o excessive alcohol use; pt says he limits use to one to 2 beers daily he currently   Past Surgical History:  Procedure Laterality Date  . COLONOSCOPY  10/09/2006   3 mm pedunculated sigmoid colon polyp removed/8-mm sessile hepatic flexure polyp (tubular villous adenoma) removed/6-mm  descending colon polyp removed/small internal hemorrhoids  . COLONOSCOPY  02/28/2011   [id number], multiple in the rectum/Internal hemorrhoids, MODERATE-CAUSING RECTAL BLEEDING  . COLONOSCOPY N/A 07/05/2016   Procedure: COLONOSCOPY;  Surgeon: [name], MD;  [facility];  Service: Endoscopy;  Laterality: N/A;  11:15am  . 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 Mild erosive gastrtitis most likely due to indomethacin   . FINGER SURGERY Right    4th and 5th digit  . FLEXIBLE SIGMOIDOSCOPY  01/02/2012   SLF: 1. the colonic mucosa appeared normal in the sigmoid colon 2. Large internal hemorrhoids: cause for rectal bleeding/pain . s/p banding X 3   . HAND SURGERY    . SAVORY DILATION N/A 07/21/2014   Procedure: SAVORY DILATION;  Surgeon: [name], MD;  Location[facility];  Service: Endoscopy;  Laterality: N/A;   Social History   Socioeconomic History  . Marital status: Single    Spouse name: Not on file  . Number of children: 0  . Years of education: 36  . Highest education level: Not on file  Occupational History  . Occupation: unemployed, Retail buyer: UNEMPLOYED  Social Needs  . Financial resource strain: Not on file  . Food insecurity:    Worry: Not on file    Inability: Not on file  . Transportation needs:    Medical: No    Non-medical: No  Tobacco Use  . Smoking status: Former Smoker    Packs/day: 1.50    Years: 33.00    Pack years: 49.50    Types: Cigarettes    Last attempt to quit: 08/16/2004    Years since quitting: 13.7  . Smokeless tobacco: Current User    Types: Snuff  .  Tobacco comment: Dips every once in a while  Substance and Sexual Activity  . Alcohol use: Yes    Alcohol/week: 3.0 standard drinks    Types: 3 Cans of beer per week    Comment: drinks 6 pack of beer on weekends  . Drug use: No  . Sexual activity: Not on file  Lifestyle  . Physical activity:    Days per week: Not on file    Minutes per session: Not on file  . Stress: Not on file  Relationships  . Social connections:    Talks on phone: Not on file    Gets together: Not on file    Attends religious service: Not on file    Active member of club or organization: Not on file    Attends meetings of clubs or organizations: Not on file    Relationship status: Not on file  Other Topics Concern  . Not  on file  Social History Narrative   Lives alone   No caffeine   Right handed   Family History  Problem Relation Age of Onset  . Hypertension Mother   . Hypertension Father   . Stroke Father   . Hypertension Sister   . Hypertension Brother   . Cancer Brother   . Cancer Brother        throat cancer  . Hypertension Brother   . Diabetes Maternal Grandmother   . Colon cancer Neg Hx    Allergies  Allergen Reactions  . Hydrocodone     hallucinations from hydrocodone  . Benadryl [Diphenhydramine Hcl (Sleep)] Other (See Comments)    Palpitations   Prior to Admission medications   Medication Sig Start Date End Date Taking? Authorizing Provider  aspirin EC 81 MG tablet Take 162 mg by mouth daily.     [provider]  FLUoxetine (PROZAC) 20 MG tablet Take 1 tablet (20 mg total) by mouth daily. Patient not taking: Reported on 04/05/2018 04/26/17   Soyla Dryer, PA-C  folic acid (FOLVITE) 161 MCG tablet Take 400 mcg by mouth daily.    [provider]  Glycerin-Hypromellose-PEG 400 (VISINE PURE TEARS OP) Place 1 drop into both eyes 3 (three) times daily as needed (dry eyes).    [provider]  metoprolol tartrate (LOPRESSOR) 25 MG tablet Take 1 tablet (25 mg total) by mouth 2 (two) times daily. Patient not taking: Reported on 04/05/2018 02/14/17 10/17/17  Rolland Porter, MD  metoprolol tartrate (LOPRESSOR) 50 MG tablet Take 25 mg by mouth 2 (two) times daily.    [provider]  Multiple Vitamin (MULTIVITAMIN WITH MINERALS) TABS tablet Take 1 tablet by mouth daily. 04/12/17   Johnson, Clanford L, MD  omeprazole (PRILOSEC) 20 MG capsule Take 1 capsule (20 mg total) by mouth daily. Patient not taking: Reported on 04/05/2018 10/17/17   Carlis Stable, NP  thiamine 100 MG tablet Take 1 tablet (100 mg total) by mouth daily. Patient not taking: Reported on 04/05/2018 04/12/17   Murlean Iba, MD  vitamin B-12 (CYANOCOBALAMIN) 1000 MCG tablet Take 1,000 mcg by mouth  daily.    [provider]  vitamin E 400 UNIT capsule Take 400 Units by mouth daily.    [provider]   No results found. Family History Reviewed and non-contributory, no pertinent history of problems with bleeding or anesthesia      Review of Systems 14 system ROS conducted and negative except for that noted in HPI   OBJECTIVE  Vitals: Patient Vitals  for the past 8 hrs:  BP Temp Temp src Pulse Resp SpO2 Height Weight  05/29/18 0601 (!) 133/91 97.7 F (36.5 C) Oral 90 18 100 % - -  05/29/18 0349 - - - (!) 0 - - - -  05/29/18 0100 130/80 98 F (36.7 C) Oral (!) 102 18 100 % '6\' 2"'$  (1.88 m) 80.8 kg   General: Alert, no acute distress Cardiovascular: Warm extremities noted Respiratory: No cyanosis, no use of accessory musculature GI: No organomegaly, abdomen is soft and non-tender Skin: No lesions in the area of chief complaint other than those listed below in MSK exam.  Neurologic:Sensation intact distally save for the below mentioned MSK exam Psychiatric: Patient is competent for consent with normal mood and affect Lymphatic: No swelling obvious and reported other than the area involved in the exam below Extremities  Left upper extremity: Distal radial nerve function appears to be intact but ulnar and AIN function weak.  He is able to make a fist.  Warm well perfused hand.  His axillary nerve function is questionable and weak at best.    Test Results Imaging CT reviewed demonstrating a head split fracture with dislocation of the head into the subcoracoid region.  Comminution of the tuberosities and relatively distal fracture line noted.  Labs cbc Recent Labs    05/29/18 0329  WBC 11.8*  HGB 8.3*  HCT 23.5*  PLT 128*    Labs inflam No results for input(s): CRP in the last 72 hours.  Invalid input(s): ESR  Labs coag Recent Labs    05/29/18 0329  INR 1.2    Recent Labs    05/29/18 0329  NA 137  K 3.2*  CL 106  CO2 24  GLUCOSE 115*   BUN 6  CREATININE 1.22  CALCIUM 8.5*     ASSESSMENT AND PLAN: 58 y.o. male with the following: Chronic left fracture dislocation with failed attempted open reduction at outside hospital.  Patient has a complex problem that will require surgery.  Based on his fracture dislocation appearance of the CT we do not feel that he would be appropriate for an attempted ORIF as this will predictably fail.  He is physiologically older than his stated age of 35 and we feel that the best option for him is a reverse total shoulder arthroplasty.  We feel that he has a significant risk of infection as well as dislocation with this previous open reduction attempt.  Talked about nerve damage and chronic issues with the shoulder as well as longevity of the components.  If he has glenoid damage that is not appreciated or other issues that are worrisome we may end up transitioning to a hemiarthroplasty instead of a reverse total shoulder arthroplasty.  He understands that this is a long recovery and therapy will be necessary for an optimal outcome.  He is limited in his ability to perform therapy as he does not have insurance currently.  All this said we still feel that his best option would be arthroplasty.  We will work on urgent scheduling in the setting of this fracture dislocation with neurologic compromise distally.  We additionally specifically discussed risks of axillary nerve injury, infection, periprosthetic fracture, continued pain and longevity of implants prior to beginning procedure.

## 2018-05-29 NOTE — Progress Notes (Signed)
Pt admitted back to room from pacu after surgery. LUE in blue elevator and up on pillow as well.  Vac intact and working without difficulty.  Pt's lue is warm to touch with good radial pulse. Pt can wiggle his fingers and move arm but arm/fingers are still numb.  Pt is slightly confused and continually pulling at drsg, tele  and arm sling.  Pt also is very impulsive and refuses any kind of help with his phone, call bell or tying his gown. Pt states he is mad that his "arm has been done this way."  Will cont to monitor and relay to oncoming nurse.

## 2018-05-29 NOTE — Progress Notes (Signed)
Orthopedic Tech Progress Note Patient Details:  Tristan Cook 05/24/1960 [id number] OR RN called requesting shoulder abduction pillow. Dropped off at OR desk  Ortho Devices Type of Ortho Device: Abduction pillow Ortho Device/Splint Interventions: Other (comment)   Post Interventions Patient Tolerated: Other (comment) Instructions Provided: Other (comment)   [name] 05/29/2018, 5:14 PM

## 2018-05-29 NOTE — Progress Notes (Signed)
Staff reported that pt was chewing something which smelled like tobacco. Tristan Cook was agreeable that he was chewing tobacco . Pt however denied he abuses any other prohibited substance. Provider notified.

## 2018-05-29 NOTE — Anesthesia Preprocedure Evaluation (Signed)
Anesthesia Evaluation  Patient identified by MRN, date of birth, ID band Patient awake    Reviewed: Allergy & Precautions, NPO status , Patient's Chart, lab work & pertinent test results  Airway Mallampati: II  TM Distance: >3 FB Neck ROM: Full    Dental  (+) Dental Advisory Given   Pulmonary neg pulmonary ROS, former smoker,    Pulmonary exam normal breath sounds clear to auscultation       Cardiovascular hypertension, Pt. on medications Normal cardiovascular exam Rhythm:Regular Rate:Normal     Neuro/Psych PSYCHIATRIC DISORDERS Depression TIA Neuromuscular disease CVA    GI/Hepatic Neg liver ROS, GERD  ,  Endo/Other  negative endocrine ROS  Renal/GU negative Renal ROS     Musculoskeletal  (+) Arthritis ,   Abdominal   Peds  Hematology negative hematology ROS (+)   Anesthesia Other Findings   Reproductive/Obstetrics negative OB ROS                             Anesthesia Physical Anesthesia Plan  ASA: III  Anesthesia Plan: General   Post-op Pain Management: GA combined w/ Regional for post-op pain   Induction: Intravenous  PONV Risk Score and Plan: Ondansetron and Dexamethasone  Airway Management Planned: Oral ETT  Additional Equipment: None  Intra-op Plan:   Post-operative Plan: Extubation in OR  Informed Consent: I have reviewed the patients History and Physical, chart, labs and discussed the procedure including the risks, benefits and alternatives for the proposed anesthesia with the patient or authorized representative who has indicated his/her understanding and acceptance.     Dental advisory given  Plan Discussed with: CRNA  Anesthesia Plan Comments:         Anesthesia Quick Evaluation

## 2018-05-29 NOTE — H&P (Signed)
History and Physical    Tristan Cook:[id number] DOB: 17-Dec-1960 DOA: 05/29/2018  Referring MD/NP/PA:   PCP: Wyatt Haste, NP   Patient coming from:  The patient is coming from home.  At baseline, pt is independent for most of ADL.        Chief Complaint: syncope and left shoulder pain  HPI: Tristan Cook is a 58 y.o. male with medical history significant of alcohol abuse, hypertension, hyperlipidemia, TIA, GERD, gout, depression, chronic back pain, peripheral neuropathy due to alcohol abuse, who presents with syncope and  left shoulder pain.  [facility]  Patient states that he had 2 episodes of syncope 4 days ago.  Both episodes were unwitnessed.  Patient reports that syncope happened when he was walking, and suddenly found himself on the floor.  He believes that the first episode of syncope resulted in his left shoulder pain.  The pain is constant, 10 out of 10 severity, sharp, nonradiating. He denies any prodromal symptoms before the first episode, but did not notice some dizziness prior to the second episode.  Patient denies unilateral weakness in extremities.  No facial droop or slurred speech.  He has alcoholic peripheral neuropathy, and has fingertip numbness which is chronic issue. Patient denies any chest pain, shortness of breath, cough.  No fever or chills.  No nausea vomiting, diarrhea, abdominal pain, symptoms of UTI.   Pt was initially seen in [facility].  Patient was found to have fracture dislocation of proximal left humerus.  Reduction and ORIF were attempted by orthopedic surgeon there without success.  Dr. Ginette Pitman of orthopedic surgeon in [facility] hospital was consulted, who agreed to consult on this case.  Pt was found to have WBC 12.1, negative troponin, alcohol level less than 10, AKI with creatinine 1.52 and BUN 8, abnormal liver function (ALP 63, AST 55.9, total bilirubin 1.9), negative chest x-ray.  CT head is negative for acute  intracranial abnormalities.  Currently temperature normal, tachycardia, oxygen saturation 100%. Patient is admitted to telemetry bed as inpatient.   Review of Systems:   General: no fevers, chills, no body weight gain, has fatigue HEENT: no blurry vision, hearing changes or sore throat Respiratory: no dyspnea, coughing, wheezing CV: no chest pain, no palpitations GI: no nausea, vomiting, abdominal pain, diarrhea, constipation GU: no dysuria, burning on urination, increased urinary frequency, hematuria  Ext: no leg edema Neuro: no unilateral weakness, numbness, or tingling, no vision change or hearing loss. Had fall and syncope Skin: no rash, no skin tear. MSK: Left shoulder pain.   Heme: No easy bruising.  Travel history: No recent long distant travel.  Allergy:  Allergies  Allergen Reactions  . Hydrocodone     hallucinations from hydrocodone  . Benadryl [Diphenhydramine Hcl (Sleep)] Other (See Comments)    Palpitations    Past Medical History:  Diagnosis Date  . Abdominal pain   . Adenomatous colon polyp 2008  . Arthritis   . Back pain   . Bilateral lumbar radiculopathy   . Blurred vision, bilateral   . Chronic back pain   . Constipation   . Depression   . GERD (gastroesophageal reflux disease)   . Gout   . Hemorrhoids   . High cholesterol   . Hypertension   . Neck pain   . Neuropathy of both feet   . Numbness    rectal  . Numbness of legs   . Stroke ([place])   . Substance abuse ([place])  h/o excessive alcohol use; pt says he limits use to one to 2 beers daily he currently    Past Surgical History:  Procedure Laterality Date  . COLONOSCOPY  10/09/2006   3 mm pedunculated sigmoid colon polyp removed/8-mm sessile hepatic flexure polyp (tubular villous adenoma) removed/6-mm  descending colon polyp removed/small internal hemorrhoids  . COLONOSCOPY  02/28/2011   ZOX:WRUEAV, multiple in the rectum/Internal hemorrhoids, MODERATE-CAUSING RECTAL BLEEDING  . COLONOSCOPY  N/A 07/05/2016   Procedure: COLONOSCOPY;  Surgeon: [name], MD;  [facility];  Service: Endoscopy;  Laterality: N/A;  11:15am  . 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 Mild erosive gastrtitis most likely due to indomethacin   . FINGER SURGERY Right    4th and 5th digit  . FLEXIBLE SIGMOIDOSCOPY  01/02/2012   SLF: 1. the colonic mucosa appeared normal in the sigmoid colon 2. Large internal hemorrhoids: cause for rectal bleeding/pain . s/p banding X 3   . HAND SURGERY    . SAVORY DILATION N/A 07/21/2014   Procedure: SAVORY DILATION;  Surgeon: [name], MD;  [facility];  Service: Endoscopy;  Laterality: N/A;    Social History:  reports that he quit smoking about 13 years ago. His smoking use included cigarettes. He has a 49.50 pack-year smoking history. His smokeless tobacco use includes snuff. He reports current alcohol use of about 3.0 standard drinks of alcohol per week. He reports that he does not use drugs.  Family History:  Family History  Problem Relation Age of Onset  . Hypertension Mother   . Hypertension Father   . Stroke Father   . Hypertension Sister   . Hypertension Brother   . Cancer Brother   . Cancer Brother        throat cancer  . Hypertension Brother   . Diabetes Maternal Grandmother   . Colon cancer Neg Hx      Prior to Admission medications   Medication Sig Start Date End Date Taking? Authorizing Provider  aspirin EC 81 MG tablet Take 162 mg by mouth daily.     [provider]  FLUoxetine (PROZAC) 20 MG tablet Take 1 tablet (20 mg total) by mouth daily. Patient not taking: Reported on 04/05/2018 04/26/17   Soyla Dryer, PA-C  folic acid (FOLVITE) 409 MCG tablet Take 400 mcg by mouth daily.    [provider]  Glycerin-Hypromellose-PEG 400 (VISINE PURE TEARS OP) Place 1 drop into both eyes 3 (three) times daily as needed (dry eyes).    [provider]  metoprolol tartrate (LOPRESSOR) 25 MG tablet Take 1 tablet (25 mg total) by mouth 2 (two) times daily. Patient not taking: Reported on 04/05/2018 02/14/17 10/17/17  Rolland Porter, MD  metoprolol tartrate (LOPRESSOR) 50 MG tablet Take 25 mg by mouth 2 (two) times daily.    [provider]  Multiple Vitamin (MULTIVITAMIN WITH MINERALS) TABS tablet Take 1 tablet by mouth daily. 04/12/17   Johnson, Clanford L, MD  omeprazole (PRILOSEC) 20 MG capsule Take 1 capsule (20 mg total) by mouth daily. Patient not taking: Reported on 04/05/2018 10/17/17   Carlis Stable, NP  thiamine 100 MG tablet Take 1 tablet (100 mg total) by mouth daily. Patient not taking: Reported on 04/05/2018 04/12/17   Murlean Iba, MD  vitamin B-12 (CYANOCOBALAMIN) 1000 MCG tablet Take 1,000 mcg by mouth daily.    [provider]  vitamin E 400 UNIT capsule Take 400  Units by mouth daily.    [provider]    Physical Exam: Vitals:   05/29/18 0100  BP: 130/80  Pulse: (!) 102  Resp: 18  Temp: 98 F (36.7 C)  TempSrc: Oral  SpO2: 100%  Weight: 80.8 kg  Height: 6\' 2"  (1.88 m)   General: Not in acute distress HEENT:       Eyes: PERRL, EOMI, no scleral icterus.       ENT: No discharge from the ears and nose, no pharynx injection, no tonsillar enlargement.        Neck: No JVD, no bruit, no mass felt. Heme: No neck lymph node enlargement. Cardiac: S1/S2, RRR, No murmurs, No gallops or rubs. Respiratory:  No rales, wheezing, rhonchi or rubs. GI: Soft, nondistended, nontender, no rebound pain, no organomegaly, BS present. GU: No hematuria Ext: No pitting leg edema bilaterally. 2+DP/PT pulse bilaterally. Musculoskeletal: Has left shoulder tenderness, with immobilizer sling. Skin: No rashes.  Neuro: Alert, oriented X3, cranial nerves II-XII grossly intact, moves all extremities normally. Psych: Patient is not psychotic, no suicidal or hemocidal ideation.  Labs on Admission: I have  personally reviewed following labs and imaging studies  CBC: Recent Labs  Lab 05/29/18 0329  WBC 11.8*  HGB 8.3*  HCT 23.5*  MCV 92.9  PLT 242*   Basic Metabolic Panel: Recent Labs  Lab 05/29/18 0329  NA 137  K 3.2*  CL 106  CO2 24  GLUCOSE 115*  BUN 6  CREATININE 1.22  CALCIUM 8.5*   GFR: Estimated Creatinine Clearance: 78.2 mL/min (by C-G formula based on SCr of 1.22 mg/dL). Liver Function Tests: No results for input(s): AST, ALT, ALKPHOS, BILITOT, PROT, ALBUMIN in the last 168 hours. No results for input(s): LIPASE, AMYLASE in the last 168 hours. No results for input(s): AMMONIA in the last 168 hours. Coagulation Profile: Recent Labs  Lab 05/29/18 0329  INR 1.2   Cardiac Enzymes: No results for input(s): CKTOTAL, CKMB, CKMBINDEX, TROPONINI in the last 168 hours. BNP (last 3 results) No results for input(s): PROBNP in the last 8760 hours. HbA1C: No results for input(s): HGBA1C in the last 72 hours. CBG: No results for input(s): GLUCAP in the last 168 hours. Lipid Profile: No results for input(s): CHOL, HDL, LDLCALC, TRIG, CHOLHDL, LDLDIRECT in the last 72 hours. Thyroid Function Tests: No results for input(s): TSH, T4TOTAL, FREET4, T3FREE, THYROIDAB in the last 72 hours. Anemia Panel: No results for input(s): VITAMINB12, FOLATE, FERRITIN, TIBC, IRON, RETICCTPCT in the last 72 hours. Urine analysis:    Component Value Date/Time   COLORURINE YELLOW 04/22/2017 1637   APPEARANCEUR CLEAR 04/22/2017 1637   LABSPEC 1.011 04/22/2017 1637   PHURINE 7.0 04/22/2017 1637   GLUCOSEU NEGATIVE 04/22/2017 1637   GLUCOSEU NEG mg/dL 08/28/2006 1728   HGBUR SMALL (A) 04/22/2017 1637   HGBUR negative 06/16/2008 1509   BILIRUBINUR NEGATIVE 04/22/2017 1637   BILIRUBINUR negative 03/29/2015 1419   KETONESUR NEGATIVE 04/22/2017 1637   PROTEINUR NEGATIVE 04/22/2017 1637   UROBILINOGEN 0.2 03/29/2015 1419   UROBILINOGEN 0.2 10/20/2010 0432   NITRITE NEGATIVE 04/22/2017  1637   LEUKOCYTESUR NEGATIVE 04/22/2017 1637   Sepsis Labs: @LABRCNTIP (procalcitonin:4,lacticidven:4) )No results found for this or any previous visit (from the past 240 hour(s)).   Radiological Exams on Admission: No results found.   EKG:   Not done yet, will get one.   Assessment/Plan Principal Problem:   Closed fracture of left proximal humerus Active Problems:   TIA (transient ischemic attack)  Syncope   Alcohol abuse   Fall   Dislocation of left shoulder joint   Abnormal LFTs   Hypokalemia   Closed fracture of left proximal humerus and dislocation of left shoulder joint: No neurovascular compromise. Attempted CR and ORIF were not successful in another facility.  Dr. Ginette Pitman of Ortho was consulted-->RN will inform Dr. Marcelino Scot of pt's arrival.   - will admit to tele bed as inpt - Pain control: morphine prn and percocet - When necessary Zofran for nausea - Robaxin for muscle spasm - type and cross - INR/PTT  Hx of TIA (transient ischemic attack): -continue ASA  Syncope and fall: CT-head negative. No focal neurologic findings on physical examination.  Likely due to alcohol abuse. -Observe closely -Frequent neuro check  Alcohol abuse: -Did counseling about the importance of quitting drinking -CIWA protocol  Mild Abnormal LFTs: Possibly due to alcohol abuse. -Check hepatitis panel. -Avoid using liver toxic medication, such as Tylenol  Hypokalemia: K= 3.2 in AM - Repleted - Check Mg level     Inpatient status:  Patient requires inpatient status due to the need of surgery for closed fracture of left proximal humerus and dislocation of left shoulder joint. I certify that at the point of admission it is my clinical judgment that the patient will require inpatient hospital care spanning beyond 2 midnights from the point of admission.    DVT ppx: SCD Code Status: Full code Family Communication: None at bed side.  Disposition Plan:  Anticipate discharge back to  previous home environment Consults called:  Dr. Marcelino Scot Admission status: Inpatient/tele    Date of Service 05/29/2018    [facility]   If 7PM-7AM, please contact night-coverage [email or web address] Password [facility] 05/29/2018, 5:38 AM

## 2018-05-29 NOTE — Progress Notes (Signed)
Admitted by Dr Blaine Hamper this morning.   Patient had a fall 4 days ago and now presented with dislocated fracture of the right proximal humerus.  Failed open reduction therefore transferred here for surgery evaluation.  Seen by surgery, plans for intervention later today. No other complaints by the patient besides pain with movement.  Vitals are stable at the moment. Physical exam he had sling on his left arm with limited range of motion due to pain and dislocation.  Mechanical fall leading to left proximal humerus fracture requiring surgical intervention.  CT of the head is negative.  We will continue to monitor.  Continue him on alcohol withdrawal protocol.  Follow LFTs.  Call with further questions as needed.  Gerlean Ren MD

## 2018-05-29 NOTE — Progress Notes (Signed)
Pt very agitated and kicking staff. He is thrashing around and moving left arm wildly. Given ativan to help calm. Dr. Hilbert Bible paged at this time, no new orders

## 2018-05-29 NOTE — Transfer of Care (Signed)
Immediate Anesthesia Transfer of Care Note  Patient: Tristan Cook  Procedure(s) Performed: REVERSE SHOULDER ARTHROPLASTY (Left )  Patient Location: PACU  Anesthesia Type:General  Level of Consciousness: drowsy  Airway & Oxygen Therapy: Patient Spontanous Breathing and Patient connected to face mask oxygen  Post-op Assessment: Report given to RN and Post -op Vital signs reviewed and stable  Post vital signs: Reviewed and stable  Last Vitals:  Vitals Value Taken Time  BP 115/72 05/29/2018  5:24 PM  Temp    Pulse 98   Resp 28 05/29/2018  5:27 PM  SpO2 99   Vitals shown include unvalidated device data.  Last Pain:  Vitals:   05/29/18 0800  TempSrc:   PainSc: 0-No pain         Complications: No apparent anesthesia complications

## 2018-05-29 NOTE — Op Note (Signed)
Orthopaedic Surgery Operative Note (CSN: [id number])  HUBERT RAATZ  02/16/1961 Date of Surgery: 05/29/2018   Diagnoses:  Left proximal humerus fracture dislocation  Procedure: Left open reduction shoulder dislocation 23670 Left reverse Total Shoulder Arthroplasty 23472 Incisional wound vac placement 85631 Modifier 22 - Extensively difficult procedure   Operative Finding Patient had a surgery at an outside hospital at [facility] with an unknown surgeon at this time who performed a suboptimal incisional placement and a deltoid split to attempt to open reduce fracture dislocation.  This made our approach exceedingly difficult.  We had to utilize the pre-existing incision which was far lateral about 6 cm from where we would normally make our incision.  We were able to remove all bony fragments from the infra coracoid region and the coracoid fracture was also noted.  The tuberosities were comminuted and we felt that there is extraordinarily poor healing potential for the tuberosities in the setting of the patient's chronic alcoholism.  Felt that reverse total shoulder arthroplasty though the patient was young was reasonable.  Good stability of the implants.  Tuberosity fixation was robust.  We are not able to check the axillary nerve secondary to the fractures noted.  Post-operative plan: The patient will be NWB in sling.  The patient will be admitted to the medicine service for medical management .  DVT prophylaxis not indicated in isolated upper extremity surgery patient with no specific risks factors.  Pain control with PRN pain medication preferring oral medicines.  Wound VAC for 7 days.  Follow up plan will be scheduled in approximately 7 days for incision check and XR.  Physical therapy to start after first visit.  Post-Op Diagnosis: Same Surgeons:Primary: Hiram Gash, MD Assistants:Brandon Lynnell Jude Location: [facility] OR ROOM 03 Anesthesia: General Antibiotics: Ancef 2g preop, Vancomycin  1024m locally as well as tobramycin 1.2 g Tourniquet time: None Estimated Blood Loss: 2497Complications: None Specimens: None Implants: Implant Name Type Inv. Item Serial No. Manufacturer Lot No. LRB No. Used Action  BASEPLATE GLENOSPHERE 202OVSTD - [id number] BASEPLATE GLENOSPHERE 241OISTD [id number]  Left 1 Implanted  GLENOSPHERE STANDARD 39 - [id number] GLENOSPHERE STANDARD 39 [id number]  Left 1 Implanted  CAP LOCKING COCR - [id number] Cap CAP LOCKING COCR [id number] [employer]  Left 1 Implanted  SCREW CENTRAL THREAD 6.5X45 - [id number] SCREW CENTRAL THREAD 6.5X45  [employer]  Left 1 Implanted  SCREW PERIPHERAL 42 - [id number] SCREW PERIPHERAL 42  [employer]  Left 1 Implanted  SCREW 5.0X38 SMALL F/PERFORM - [id number] SCREW 5.0X38 SMALL F/PERFORM  [employer]  Left 1 Implanted  SCREW ASSEMBLY COCR TYPE 0 - [id number] SCREW ASSEMBLY COCR TYPE 0 [id number]  Left 1 Implanted  IMPLANT PROX BODY PTC 15X132 - [id number] IMPLANT PROX BODY PTC 15X132 [id number]  Left 1 Implanted  IMPLANT REVERSE SHOULDER 0X3.5 - [id number] IMPLANT REVERSE SHOULDER 0X3.5 [id number] Left 1 Implanted  INSERT REV KIT SHOULDER 6X39 - [id number] INSERT REV KIT SHOULDER 6X39 [id number]  Left 1 Implanted  aequalis flex revive   [id number]  Left 1 Implanted    Indications for Surgery:   HDAVIDJAMES BLANSETTis a 58y.o. male with fall about 4 days ago resulting in fracture dislocation of the left proximal humerus.  There was an attempt made an outside hospital for an open reduction but the surgeon aborted the procedure as he  was unable to perform the procedure adequately.  Patient was then transferred to [facility] and Dr. Doreatha Martin requested that I perform his definitive care secondary to the complex nature of his shoulder fracture.  Patient had baseline  questionable function of his axillary nerve and had weak AIN and ulnar motor function distally thus the case was urgent.  We are extremely worried about infection in the setting of recent surgery.  Dislocation in the setting of weak axillary nerve function is also possible.  Longevity of implants was also discussed.  We felt that the patient had no other good options.  Procedure:   The patient was identified in the preoperative holding area where the surgical site was marked. Block placed by anesthesia with exparel.  The patient was taken to the OR where a procedural timeout was called and the above noted anesthesia was induced.  The patient was positioned beachchair on allen table with spider arm positioner.  Preoperative antibiotics were dosed.  The patient's left shoulder was prepped and draped in the usual sterile fashion.  A second preoperative timeout was called.       Patient had a atypically placed far lateral incision to enter the deltopectoral region.  We carefully draped and changed our gloves after taking out the staples that were placed.  We irrigated the superficial layer and noted that the previous surgeon had gone through the deltoid itself in an extensile fashion thus we were concerned about the integrity of the axillary nerve distally that would have innervated the anterior deltoid itself.  This could not be repaired thus we did not explore this area.  We had to do an extensive subcutaneous dissection to get to the deltopectoral interval which was far more medial than our incision.  We were able to use a [place] retractor to help the cyst in this.  We identified the deltopectoral interval and the fracture and coracoid were able to open this interval.  That point we entered the clavipectoral fascia which was partially disrupted with the fracture dislocation itself.  We placed a Covell retractor in the subcutaneous layer as the coracoid and the conjoined tendon would not support this  retractor itself.  That point we identified the humeral head which was anterior and inferior to the glenoid.  It was attached to the fragments of lesser tuberosity however there are minimal bony attachments left.  Were able to utilize a Cobb as well as a towel clip to remove this fragment en bloc.  There was a head split posteriorly.  This point we took an extensive mounted time to mobilize the tuberosities and assess them.  There is significant comminution of the shaft as well as the cancellus bone of the head and there were fragments of the tuberosity posteriorly.  We are able to mobilize these in the place FiberWire tag stitches were then each.  We did not feel the tuberosities would likely be amenable to the necessary healing required for a hemiarthroplasty and were able to visualize the glenoid thus we felt that a reverse total shoulder arthroplasty would be appropriate.  This included open reduction internal fixation of the tuberosities with suture as described by Ardyth Man al.    We did not identify and mobilize the tissues around the axillary nerve for fear of further disruption.  There is significant hematoma and scar in this area but we felt it was better to leave this area untouched and protected with retractors.   The glenoid was relatively preserved as we  would expect in this fracture patient.  The remaining labrum was removed circumferentially taking great care not to disrupt the posterior capsule.    The glenoid drill guide was placed and used to drill a guide pin in the center, inferior position. The glenoid face was then reamed concentrically over the guide wire. The center hole was drilled over the guidepin in a near anatomic angle of version. Next the glenoid vault was drilled back to a depth of 45 mm.  We tapped and then placed a 71m size baseplate with 0 lateralization was selected with a 45 mm x 6.558mlength central screw.  The base plate was screwed into the glenoid vault  obtaining secure fixation. We next placed superior and inferior locking screws for additional fixation.  Next a 39 mm glenosphere was selected and impacted onto the baseplate. The center screw was tightened.   We then repositioned the arm to give access to the humeral shaft fragment.  Drill holes were placed and fiberwire sutures in the shaft for vertical fixation of the tuberosities.  We broached with the Revive stem implants  starting with a size 9 reamer and reaming up to 15 which obtained an appropriate fit.  The proximal body was sized separately and attached to trial and achieve a stable articulation.   No additional spacer was necessary.  We used the 15 partially coated stem and the proximal body.   We trialed with multiple size tray and polyethylene options and selected a 0 high which provided good stability and range of motion without excess soft tissue tension. The offset was dialed in to match the normal anatomy. The shoulder was trialed.  There was good ROM in all planes and the shoulder was stable with no inferior translation.   We then mobilized her tuberosities again and placed the anterior deep limbs of the 4 #5 fiber wires around the stem.  1 of these was tied down fixing the greater tuberosity in place after bone graft harvest from the humeral head component was placed underneath.  A +0 high offset tray was selected and impacted onto the stem.   A 39+6 polyethylene liner was impacted onto the stem.  The joint was reduced and thoroughly irrigated with pulsatile lavage. The remaining sutures were then placed through the subscapularis and the bone tendon junction and the tuberosities were reduced after bone graft placed beneath as autograft at the subscap.  We horizontally secured the tuberosities before placing vertical fixation with the suture that was placed into the shaft.  Tuberosities moved as a unit were happy with her overall reduction.  This was checked on fluoroscopy confirming our  position.  We irrigated copiously at this point.    Placed local vancomycin and tobramycin powder at this point.   The wounds were cleaned and dried and an Praveena suction dressing dressing was placed. The drapes taken down. The arm was placed into sling with abduction pillow. Patient was awakened, extubated, and transferred to the recovery room in stable condition. There were no intraoperative complications. The sponge, needle, and attention counts were correct at the end of the case.   BrJoya GaskinsOPA-C, present and scrubbed throughout the case, critical for completion in a timely fashion, and for retraction, instrumentation, closure.

## 2018-05-29 NOTE — Anesthesia Postprocedure Evaluation (Signed)
Anesthesia Post Note  Patient: NITIN MCKOWEN  Procedure(s) Performed: REVERSE SHOULDER ARTHROPLASTY (Left )     Patient location during evaluation: PACU Anesthesia Type: General Level of consciousness: awake and alert Pain management: pain level controlled Vital Signs Assessment: post-procedure vital signs reviewed and stable Respiratory status: spontaneous breathing, nonlabored ventilation, respiratory function stable and patient connected to nasal cannula oxygen Cardiovascular status: blood pressure returned to baseline and stable Postop Assessment: no apparent nausea or vomiting Anesthetic complications: no    Last Vitals:  Vitals:   05/29/18 1844 05/29/18 1937  BP: 122/81 125/81  Pulse:  96  Resp: 20   Temp:  36.7 C  SpO2: 96% 100%    Last Pain:  Vitals:   05/29/18 1937  TempSrc: Oral  PainSc:                  Kathryne Ramella COKER

## 2018-05-29 NOTE — Progress Notes (Signed)
Mr. Larnie was received from [place] packet given  and Admission INP armband verified with pt. Fall risk assessment completed . Pt verbalized understanding risks associated with falls.   Left Shoulder sling intact . Fx to left Humerus otherwise skin intact   To monitor and treat pt per MD and Nursing orders

## 2018-05-29 NOTE — Anesthesia Procedure Notes (Signed)
Anesthesia Regional Block: Interscalene brachial plexus block   Pre-Anesthetic Checklist: ,, timeout performed, Correct Patient, Correct Site, Correct Laterality, Correct Procedure, Correct Position, site marked, Risks and benefits discussed,  Surgical consent,  Pre-op evaluation,  At surgeon's request and post-op pain management  Laterality: Left  Prep: chloraprep       Needles:  Injection technique: Single-shot  Needle Type: Stimulator Needle - 40     Needle Length: 4cm  Needle Gauge: 22     Additional Needles:   Procedures:,,,, ultrasound used (permanent image in chart),,,,  Narrative:  Start time: 05/29/2018 1:30 PM End time: 05/29/2018 1:33 PM Injection made incrementally with aspirations every 5 mL.  Performed by: Personally  Anesthesiologist: Nolon Nations, MD  Additional Notes: BP cuff, EKG monitors applied. Sedation begun. Nerve location verified with U/S. Anesthetic injected incrementally, slowly , and after neg aspirations under direct u/s guidance. Good perineural spread. Tolerated well.

## 2018-05-29 NOTE — Interval H&P Note (Signed)
Discussed case, risks and benefits with patient again.  All questions answered, no change to history.  We additionally specifically discussed risks of axillary nerve injury, infection, periprosthetic fracture, continued pain and longevity of implants prior to beginning procedure.     Tambra Muller MD  

## 2018-05-29 NOTE — Anesthesia Procedure Notes (Signed)
Procedure Name: Intubation Date/Time: 05/29/2018 2:18 PM Performed by: Mariea Clonts, CRNA Pre-anesthesia Checklist: Patient identified, Emergency Drugs available, Suction available and Patient being monitored Patient Re-evaluated:Patient Re-evaluated prior to induction Oxygen Delivery Method: Circle System Utilized Preoxygenation: Pre-oxygenation with 100% oxygen Induction Type: IV induction Ventilation: Mask ventilation without difficulty Laryngoscope Size: Glidescope and 4 Grade View: Grade I Tube type: Oral Tube size: 8.0 mm Number of attempts: 1 Airway Equipment and Method: Stylet and Oral airway Placement Confirmation: ETT inserted through vocal cords under direct vision,  positive ETCO2 and breath sounds checked- equal and bilateral Tube secured with: Tape Dental Injury: Teeth and Oropharynx as per pre-operative assessment

## 2018-05-29 NOTE — Progress Notes (Signed)
Pharmacy Antibiotic Note  Tristan Cook is a 58 y.o. male admitted on 05/29/2018 with surgical prophylaxis.  Pharmacy has been consulted for Vancomycin dosing.  Patient had recent attempted open reduction of shoulder humerus fracture at [facility] but unable to get joint reduced. Surgical site was closed and patient was sent to [facility] for further evaluation. Patient is now s/p left reverse total shoulder arthroplasty and left open reduction shoulder dislocation with wound vac placement. Pharmacy has been consulted to dose Vancomycin post-operatively for surgical prophylaxis.   Patient received Vancomycin 1000mg  IV pre-operatively at 12:26PM. SCr this am was 1.22 with estimated CrCl ~78 mL/min.  WBC is slightly elevated at 11.8. Patient is currently afebrile.   Plan: Vancomycin 1000mg  IV every 12 hours.  Monitor renal function, any culture results, and clinical status. Follow-up plan for length of therapy.  Height: 6' 2.02" (188 cm) Weight: 178 lb 1.6 oz (80.8 kg) IBW/kg (Calculated) : 82.24  Temp (24hrs), Avg:97.9 F (36.6 C), Min:97.7 F (36.5 C), Max:98 F (36.7 C)  Recent Labs  Lab 05/29/18 0329  WBC 11.8*  CREATININE 1.22    Estimated Creatinine Clearance: 78.2 mL/min (by C-G formula based on SCr of 1.22 mg/dL).    Allergies  Allergen Reactions  . Hydrocodone     hallucinations from hydrocodone  . Benadryl [Diphenhydramine Hcl (Sleep)] Other (See Comments)    Palpitations    Antimicrobials this admission: Vancomycin 3/18 >>  Dose adjustments this admission:   Microbiology results: 3/18 MRSA pcr negative  Thank you for allowing pharmacy to be a part of this patient's care.  Sloan Leiter, PharmD, BCPS, BCCCP Clinical Pharmacist Please refer to [facility] for [place] numbers 05/29/2018 5:27 PM

## 2018-05-30 ENCOUNTER — Encounter (HOSPITAL_COMMUNITY): Payer: Self-pay | Admitting: Orthopaedic Surgery

## 2018-05-30 DIAGNOSIS — R410 Disorientation, unspecified: Secondary | ICD-10-CM

## 2018-05-30 LAB — BASIC METABOLIC PANEL
Anion gap: 8 (ref 5–15)
BUN: 10 mg/dL (ref 6–20)
CHLORIDE: 105 mmol/L (ref 98–111)
CO2: 23 mmol/L (ref 22–32)
Calcium: 8.3 mg/dL — ABNORMAL LOW (ref 8.9–10.3)
Creatinine, Ser: 1.14 mg/dL (ref 0.61–1.24)
GFR calc Af Amer: >60 mL/min (ref >60)
GFR calc non Af Amer: >60 mL/min (ref >60)
Glucose, Bld: 104 mg/dL — ABNORMAL HIGH (ref 70–99)
POTASSIUM: 4.1 mmol/L (ref 3.5–5.1)
Sodium: 136 mmol/L (ref 135–145)

## 2018-05-30 LAB — CBC
HCT: 21.9 % — ABNORMAL LOW (ref 39.0–52.0)
HEMOGLOBIN: 7.7 g/dL — AB (ref 13.0–17.0)
MCH: 33 pg (ref 26.0–34.0)
MCHC: 35.2 g/dL (ref 30.0–36.0)
MCV: 94 fL (ref 80.0–100.0)
Platelets: 134 10*3/uL — ABNORMAL LOW (ref 150–400)
RBC: 2.33 MIL/uL — ABNORMAL LOW (ref 4.22–5.81)
RDW: 13.4 % (ref 11.5–15.5)
WBC: 15.1 10*3/uL — ABNORMAL HIGH (ref 4.0–10.5)
nRBC: 0 % (ref 0.0–0.2)

## 2018-05-30 LAB — VITAMIN B12: Vitamin B-12: 3939 pg/mL — ABNORMAL HIGH (ref 180–914)

## 2018-05-30 LAB — TSH: TSH: 0.224 u[IU]/mL — ABNORMAL LOW (ref 0.350–4.500)

## 2018-05-30 LAB — MAGNESIUM: Magnesium: 1.7 mg/dL (ref 1.7–2.4)

## 2018-05-30 LAB — HEPATITIS PANEL, ACUTE
HCV Ab: <0.1 s/co ratio (ref 0.0–0.9)
HEP A IGM: NEGATIVE
Hep B C IgM: NEGATIVE
Hepatitis B Surface Ag: NEGATIVE

## 2018-05-30 LAB — AMMONIA: Ammonia: 47 umol/L — ABNORMAL HIGH (ref 9–35)

## 2018-05-30 MED ORDER — OXYCODONE HCL 5 MG PO TABS: ORAL_TABLET | ORAL | 0 refills | Status: AC

## 2018-05-30 MED ORDER — MELOXICAM 7.5 MG PO TABS: 7.5000 mg | ORAL_TABLET | Freq: Every day | ORAL | 2 refills | Status: DC

## 2018-05-30 MED ORDER — HALOPERIDOL LACTATE 5 MG/ML IJ SOLN
2.0000 mg | Freq: Four times a day (QID) | INTRAMUSCULAR | Status: AC | PRN
  Administered 2018-05-30 (×2): 2 mg via INTRAVENOUS
  Filled 2018-05-30 (×2): qty 1

## 2018-05-30 MED ORDER — ONDANSETRON HCL 4 MG PO TABS: 4.0000 mg | ORAL_TABLET | Freq: Three times a day (TID) | ORAL | 1 refills | Status: AC | PRN

## 2018-05-30 MED ORDER — ACETAMINOPHEN 500 MG PO TABS: 1000.0000 mg | ORAL_TABLET | Freq: Three times a day (TID) | ORAL | 0 refills | Status: AC

## 2018-05-30 NOTE — Progress Notes (Signed)
OT Cancellation Note  Patient Details Name: Tristan Cook MRN: [id number] DOB: 29-Mar-1960   Cancelled Treatment:    Reason Eval/Treat Not Completed: Other (comment) Met with pt fast asleep in bed, not awakening to stimuli at this time. Spoke with RN who stated pt received ativan this PM and has had increased confusion and noncompliance with shoulder precautions. RN requesting OT hold at this time in hopes of improved mentation next date. Will continue to follow as available and appropriate to initiate OT POC.   Zenovia Jarred, MSOT, OTR/L Behavioral Health OT/ Acute Relief OT [facility] Office: [place] 05/30/2018, 4:12 PM

## 2018-05-30 NOTE — Progress Notes (Signed)
PROGRESS NOTE    Tristan Cook  AJO:[id number] DOB: 1960/05/08 DOA: 05/29/2018 PCP: Wyatt Haste, NP   Brief Narrative:  58 year old with a history of alcohol abuse, essential hypertension, hyperlipidemia, TIA, GERD, gout, depression, peripheral neuropathy came to the hospital for evaluation of syncope and left shoulder pain.  Patient was transferred here from [facility] rocking him for treatment for complicated proximal left humerus fracture.  Patient underwent left ORIF with wound VAC placement on 3/18 which he tolerated well.  Postoperatively he showed signs of slight confusion and delirium.   Assessment & Plan:   Principal Problem:   Closed fracture of left proximal humerus Active Problems:   TIA (transient ischemic attack)   Syncope   Alcohol abuse   Fall   Dislocation of left shoulder joint   Abnormal LFTs   Hypokalemia  Closed left proximal humerus/left shoulder fracture and dislocation - Seen by orthopedic.  Status post ORIF 3/18.  Pain control, dressing per surgical team.  Vac in place.  PT/OT ordered.  Will need to follow-up outpatient orthopedic in 1 week  Delirium, alcohol use - Haldol as needed.  EKG shows normal QTC.  Supportive care.  Alcohol withdrawal protocol.  Librium taper ordered.  Counseled to quit using alcohol.  History of TIA -Continue aspirin  Syncope/fall prior to admission -CT of the head is negative.  No focal neurologic sign.  Continue neurochecks  Transaminitis -Alcohol pattern.  Acute hepatitis panel negative.  HCV antibody negative.  HIV negative.  DVT prophylaxis: Early ambulation Code Status: Full code Family Communication: Called patient's relatives Colletta [place] and left her voicemail. Disposition Plan: Physical therapy recommending SNF once his mentation is improved  Consultants:   Orthopedic  Procedures:   None  Antimicrobials:   None   Subjective: Overnight and this morning patient was quite delirious and pulling on his IVs.   He was given IV Ativan followed by IV Haldol.  Patient was trying to get out of bed and leave.  Reported of slight left shoulder pain.  Review of Systems Otherwise negative except as per HPI, including: General: Denies fever, chills, night sweats or unintended weight loss. Resp: Denies cough, wheezing, shortness of breath. Cardiac: Denies chest pain, palpitations, orthopnea, paroxysmal nocturnal dyspnea. GI: Denies abdominal pain, nausea, vomiting, diarrhea or constipation GU: Denies dysuria, frequency, hesitancy or incontinence MS: Denies muscle aches, joint pain or swelling Neuro: Denies headache, neurologic deficits (focal weakness, numbness, tingling), abnormal gait Psych: Denies anxiety, depression, SI/HI/AVH Skin: Denies new rashes or lesions ID: Denies sick contacts, exotic exposures, travel  Objective: Vitals:   05/29/18 1937 05/29/18 2013 05/29/18 2213 05/30/18 1336  BP: 125/81 (!) 151/80 107/72 122/80  Pulse: 96 (!) 115 99 (!) 101  Resp:   20 18  Temp: 98 F (36.7 C) (!) 97.4 F (36.3 C) 97.7 F (36.5 C) 98.9 F (37.2 C)  TempSrc: Oral Oral Oral Oral  SpO2: 100% 100% 100% 98%  Weight:      Height:        Intake/Output Summary (Last 24 hours) at 05/30/2018 1354 Last data filed at 05/30/2018 0300 Gross per 24 hour  Intake 4070 ml  Output 2260 ml  Net 1810 ml   Filed Weights   05/29/18 0100 05/29/18 1212  Weight: 80.8 kg 80.8 kg    Examination: Constitutional: Delirious during my evaluation Eyes: PERRL, lids and conjunctivae normal ENMT: Mucous membranes are moist. Posterior pharynx clear of any exudate or lesions.Normal dentition.  Neck: normal, supple, no masses, no thyromegaly Respiratory:  clear to auscultation bilaterally, no wheezing, no crackles. Normal respiratory effort. No accessory muscle use.  Cardiovascular: Regular rate and rhythm, no murmurs / rubs / gallops. No extremity edema. 2+ pedal pulses. No carotid bruits.  Abdomen: no tenderness, no  masses palpated. No hepatosplenomegaly. Bowel sounds positive.  Musculoskeletal: Limited range of motion of the left shoulder with sling in place.  Slight erythema noted but expected per Ortho Skin: no rashes, lesions, ulcers. No induration Neurologic: CN 2-12 grossly intact. Sensation intact, DTR normal. Strength 4/5 in all 4.  Gait is quite unsteady. Psychiatric: Normal judgment and insight. Alert and oriented x 2. Normal mood.     Data Reviewed:   CBC: Recent Labs  Lab 05/29/18 0329 05/30/18 0341  WBC 11.8* 15.1*  HGB 8.3* 7.7*  HCT 23.5* 21.9*  MCV 92.9 94.0  PLT 128* 673*   Basic Metabolic Panel: Recent Labs  Lab 05/29/18 0329 05/30/18 0341  NA 137 136  K 3.2* 4.1  CL 106 105  CO2 24 23  GLUCOSE 115* 104*  BUN 6 10  CREATININE 1.22 1.14  CALCIUM 8.5* 8.3*  MG 1.8 1.7   GFR: Estimated Creatinine Clearance: 83.7 mL/min (by C-G formula based on SCr of 1.14 mg/dL). Liver Function Tests: No results for input(s): AST, ALT, ALKPHOS, BILITOT, PROT, ALBUMIN in the last 168 hours. No results for input(s): LIPASE, AMYLASE in the last 168 hours. No results for input(s): AMMONIA in the last 168 hours. Coagulation Profile: Recent Labs  Lab 05/29/18 0329  INR 1.2   Cardiac Enzymes: No results for input(s): CKTOTAL, CKMB, CKMBINDEX, TROPONINI in the last 168 hours. BNP (last 3 results) No results for input(s): PROBNP in the last 8760 hours. HbA1C: No results for input(s): HGBA1C in the last 72 hours. CBG: No results for input(s): GLUCAP in the last 168 hours. Lipid Profile: No results for input(s): CHOL, HDL, LDLCALC, TRIG, CHOLHDL, LDLDIRECT in the last 72 hours. Thyroid Function Tests: No results for input(s): TSH, T4TOTAL, FREET4, T3FREE, THYROIDAB in the last 72 hours. Anemia Panel: No results for input(s): VITAMINB12, FOLATE, FERRITIN, TIBC, IRON, RETICCTPCT in the last 72 hours. Sepsis Labs: No results for input(s): PROCALCITON, LATICACIDVEN in the last 168  hours.  Recent Results (from the past 240 hour(s))  MRSA PCR Screening     Status: None   Collection Time: 05/29/18  3:58 AM  Result Value Ref Range Status   MRSA by PCR NEGATIVE NEGATIVE Final    Comment:        The GeneXpert MRSA Assay (FDA approved for NASAL specimens only), is one component of a comprehensive MRSA colonization surveillance program. It is not intended to diagnose MRSA infection nor to guide or monitor treatment for MRSA infections. Performed at [facility], [place] [address] [place], [place] [address]          Radiology Studies: Ct Shoulder Left Wo Contrast  Result Date: 05/29/2018 CLINICAL DATA:  Patient status post syncope x2 4 days ago with a left shoulder injury. Pain. The patient underwent attempted intraoperative reduction of fracture dislocation 05/28/2018. Initial encounter. EXAM: CT OF THE UPPER LEFT EXTREMITY WITHOUT CONTRAST TECHNIQUE: Multidetector CT imaging of the upper left extremity was performed according to the standard protocol. COMPARISON:  Plain films left shoulder 05/27/2018. FINDINGS: Bones/Joint/Cartilage The patient has a highly comminuted fracture of the head and neck of the left humerus. Approximately the anterior 50% of the humeral head is a single fragment which is anteriorly dislocated and medially displaced 2.8  cm. This fragment includes the lesser tuberosity. The posterior aspect of the humeral head is highly comminuted. The surgical neck of the humerus is also comminuted and laterally displaced approximately 1.5 shaft widths. The patient also has an acute fracture of the distal 1.5 cm of the coracoid process which is laterally displaced 1 cm. The acromioclavicular joint is intact with moderate degenerative change present. The acromion is type 1. Ligaments Suboptimally assessed by CT. Muscles and Tendons The rotator cuff is extremely difficult to assess due to extensive comminution of the humeral head and hematoma about the joint.  The infraspinatus is visualized attaching to a fragment of the posterior aspect of the greater tuberosity. The subscapularis and supraspinatus can not be assessed. Soft tissues Scattered soft tissue gas is likely related to prior surgery. Imaged lung parenchyma is clear. IMPRESSION: Highly comminuted and displaced fracture of the surgical neck and head of the humerus. Approximately 50% of the humeral head is a single fragment which is anteriorly dislocated and medially displaced along the glenoid. Fracture of the distal 1.5 cm of the coracoid process with lateral displacement of 1 cm. The rotator cuff is extremely difficult to assess. The infraspinatus is visualized attaching to a fragment of the humeral head. Electronically Signed   By: Inge Rise M.D.   On: 05/29/2018 09:40   Dg Shoulder Left  Result Date: 05/29/2018 CLINICAL DATA:  Left proximal humeral fracture EXAM: LEFT SHOULDER - 2+ VIEW; DG C-ARM 61-120 MIN COMPARISON:  05/28/2018, 05/27/2018 FLUOROSCOPY TIME:  Radiation Exposure Index (as provided by the fluoroscopic device): Not available If the device does not provide the exposure index: Fluoroscopy Time:  11 seconds Number of Acquired Images:  2 FINDINGS: Left shoulder replacement is now seen in satisfactory position. No gross soft tissue abnormality is noted. IMPRESSION: Left shoulder replacement. Electronically Signed   By: Inez Catalina M.D.   On: 05/29/2018 16:58   Dg Shoulder Left Port  Result Date: 05/29/2018 CLINICAL DATA:  Status post left shoulder prosthesis placement. EXAM: LEFT SHOULDER - 1 VIEW COMPARISON:  C-arm radiographs obtained earlier today, left shoulder CT dated 05/29/2018 and left shoulder radiographs dated 05/27/2018. FINDINGS: Interval left shoulder prosthesis in satisfactory position and alignment. Previously described proximal humerus comminuted fracture with no new fractures seen. IMPRESSION: Satisfactory postoperative appearance of a left shoulder prosthesis.  Electronically Signed   By: Claudie Revering M.D.   On: 05/29/2018 19:11   Dg C-arm 1-60 Min  Result Date: 05/29/2018 CLINICAL DATA:  Left proximal humeral fracture EXAM: LEFT SHOULDER - 2+ VIEW; DG C-ARM 61-120 MIN COMPARISON:  05/28/2018, 05/27/2018 FLUOROSCOPY TIME:  Radiation Exposure Index (as provided by the fluoroscopic device): Not available If the device does not provide the exposure index: Fluoroscopy Time:  11 seconds Number of Acquired Images:  2 FINDINGS: Left shoulder replacement is now seen in satisfactory position. No gross soft tissue abnormality is noted. IMPRESSION: Left shoulder replacement. Electronically Signed   By: Inez Catalina M.D.   On: 05/29/2018 16:58        Scheduled Meds:  acetaminophen  1,000 mg Oral Q8H   aspirin EC  162 mg Oral Daily   celecoxib  200 mg Oral Daily   folic acid  1 mg Oral Daily   LORazepam  0-4 mg Intravenous Q6H   Followed by   Derrill Memo ON 05/31/2018] LORazepam  0-4 mg Intravenous Q12H   metoprolol tartrate  25 mg Oral BID   multivitamin with minerals  1 tablet Oral Daily   pantoprazole  40 mg Oral Daily   thiamine  100 mg Oral Daily   Or   thiamine  100 mg Intravenous Daily   vitamin B-12  1,000 mcg Oral Daily   Continuous Infusions:  sodium chloride 100 mL/hr at 05/29/18 1848   lactated ringers Stopped (05/29/18 1718)   vancomycin 1,000 mg (05/30/18 1044)     LOS: 1 day   Time spent= 40 mins    Joycelyn Liska Arsenio Loader, MD [facility]  If 7PM-7AM, please contact night-coverage [email or web address] 05/30/2018, 1:54 PM

## 2018-05-30 NOTE — TOC Initial Note (Addendum)
Transition of Care [facility]) - Initial/Assessment Note    Patient Details  Name: Tristan Cook MRN: [id number] Date of Birth: 1960-08-14  [facility] CM/SW Contact:    Sharin Mons, RN Phone Number: [phone] 05/30/2018, 10:56 AM  Clinical Narrative:                 Presented with syncope and L shoulder pain, hx of alcohol abuse, hypertension, hyperlipidemia, TIA, GERD, gout, depression, chronic back pain, peripheral neuropathy  alcohol abuse. From home alone.    s/p REVERSE SHOULDER ARTHROPLASTY (Left ), 05/29/2018  Expected Discharge Plan: [place] Barriers to Discharge: Barriers Resolved   Patient Goals and CMS Choice     Choice offered to / list presented to : Patient  Expected Discharge Plan and Services Expected Discharge Plan: [place], Pt has declined the recommendations from PT:SNF . States he is going home. Discharge Planning Services: Follow-up appt scheduled, CM Consult, [place] Acute Care Choice: Home Health(Charity care) Living arrangements for the past 2 months: Apartment Expected Discharge Date: 05/30/18                   HH Arranged: RN, PT, OT [place] Agency: [place] ([facility]), pending charity approval.   05/31/2018 @ 12:34 pm NCM received call from [place]. Liaison informed NCM pt was declined for charity care ( home health services). Stated after the branch reviewed the case they felt pt needed more from the OT and OT services  would be unavailable to pt for 3 weeks.  Prior Living Arrangements/Services Living arrangements for the past 2 months: Apartment Lives with:: Self Patient language and need for interpreter reviewed:: Yes Do you feel safe going back to the place where you live?: Yes      Need for Family Participation in Patient Care: No (Comment) Care giver support system in place?: No (comment)   Criminal Activity/Legal Involvement Pertinent to  Current Situation/Hospitalization: No - Comment as needed  Activities of Daily Living Home Assistive Devices/Equipment: Walker (specify type) ADL Screening (condition at time of admission) Patient's cognitive ability adequate to safely complete daily activities?: Yes Is the patient deaf or have difficulty hearing?: No Does the patient have difficulty seeing, even when wearing glasses/contacts?: No Does the patient have difficulty concentrating, remembering, or making decisions?: No Patient able to express need for assistance with ADLs?: Yes Does the patient have difficulty dressing or bathing?: No Independently performs ADLs?: Yes (appropriate for developmental age) Does the patient have difficulty walking or climbing stairs?: No Weakness of Legs: Both Weakness of Arms/Hands: Both  Permission Sought/Granted Permission sought to share information with : Case Manager Permission granted to share information with : Yes, Verbal Permission Granted              Emotional Assessment Appearance:: Appears stated age Attitude/Demeanor/Rapport: Engaged Affect (typically observed): Accepting Orientation: : Oriented to Place, Oriented to Self, Oriented to  Time, Oriented to Situation Alcohol / Substance Use: Tobacco Use, Alcohol Use Psych Involvement: No (comment)  Admission diagnosis:  Syncope [R55] Shoulder fracture [S42.90XA] Patient Active Problem List   Diagnosis Date Noted  . TIA (transient ischemic attack) 05/29/2018  . Syncope 05/29/2018  . Alcohol abuse 05/29/2018  . Fall 05/29/2018  . Dislocation of left shoulder joint 05/29/2018  . Closed fracture of left proximal humerus 05/29/2018  . Abnormal LFTs 05/29/2018  . Hypokalemia 05/29/2018  . Constipation 10/17/2017  . Hoarseness 10/17/2017  .  Weakness 04/11/2017  . Weakness of both legs 04/10/2017  . Vitamin B deficiency 01/22/2017  . 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 Stroke, small vessel ([place]) 01/17/2017  . Alcoholic  peripheral neuropathy ([place]) 01/17/2017  . Vitamin B12 deficiency neuropathy ([place]) 01/17/2017  . History of colonic polyps 06/14/2016  . Elevated blood pressure reading 06/14/2016  . Hemorrhoids 09/20/2015  . Depression 08/03/2015  . Back pain with radiation 06/15/2015  . Insomnia 06/03/2015  . Hyperlipidemia LDL goal <130 04/25/2015  . 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 Fatigue 03/29/2015  . Allergic rhinitis 02/10/2015  . Esophageal stricture 10/22/2014  . Overweight (BMI 25.0-29.9) 10/06/2013  . GERD (gastroesophageal reflux disease) 04/23/2012  . Hemorrhoids, internal 11/02/2011  . Essential hypertension 07/25/2007  . Neck pain on left side 07/25/2007   PCP:  Wyatt Haste, NP Pharmacy:   [facility] [address] [place] [address] Phone: [phone] Fax: [phone]     Social Determinants of Health (SDOH) Interventions    Readmission Risk Interventions 30 Day Unplanned Readmission Risk Score     Admission (Current) from 05/29/2018 in [place]  30 Day Unplanned Readmission Risk Score (%)  10 Filed at 05/30/2018 0801     This score is the patient's risk of an unplanned readmission within 30 days of being discharged (0 -100%). The score is based on dignosis, age, lab data, medications, orders, and past utilization.   Low:  0-14.9   Medium: 15-21.9   High: 22-29.9   Extreme: 30 and above       No flowsheet data found.

## 2018-05-30 NOTE — Evaluation (Signed)
Physical Therapy Evaluation Patient Details Name: Tristan Cook MRN: [id number] DOB: Sep 28, 1960 Today's Date: 05/30/2018   History of Present Illness  58 y.o. male who sustained a fall 4 days ago and resultant fracture dislocation of the proximal humerus.  He was at [facility] and a provider there attempted an open reduction but failed to get the joint reduced.  He then was closed and a dressing was applied and he was sent to [facility] for further evaluation. 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 L open reduction shoulder dislocation, L reverse shoulder arthroplasty with wound vac placement.  Clinical Impression  Patient is s/p above surgery resulting in functional limitations due to the deficits listed below (see PT Problem List). Pt has poor understanding of his deficits and decreased safety awareness, pt with decreased balance, strength and endurance. Pt requires education to need for sling and NWB through L UE. Pt requires max to modA for sit to stand for steadying and mod to maxA for ambulation of 8 feet.  Patient will benefit from skilled PT to increase their independence and safety with mobility to allow discharge to the venue listed below.       Follow Up Recommendations SNF    Equipment Recommendations  Other (comment)(TBD at next venue)    Recommendations for Other Services OT consult     Precautions / Restrictions Precautions Precautions: Fall Precaution Comments: hx of falling, humeral fx result of fall Required Braces or Orthoses: Other Brace;Sling Other Brace: L shoulder AB sling with wound vac placement Restrictions Weight Bearing Restrictions: Yes LUE Weight Bearing: Non weight bearing      Mobility  Bed Mobility               General bed mobility comments: sitting EoB on entry  Transfers Overall transfer level: Needs  assistance Equipment used: 1 person hand held assist Transfers: Sit to/from Stand Sit to Stand: Mod assist;Max assist         General transfer comment: maxA for initial sit>stand, pt reports that he is unable to stand due to feet slipping due to socks, pt request PT put on shoes, placed compression socks and sneakers, modA for 2nd attempt, pt with increased support with posterior lean on bed, pt requires modA for steadying for pt to bring LE under BoS  Ambulation/Gait Ambulation/Gait assistance: Mod assist;Max assist Gait Distance (Feet): 8 Feet Assistive device: 1 person hand held assist Gait Pattern/deviations: Step-through pattern;Shuffle;Decreased stride length;Staggering left;Staggering right;Leaning posteriorly;Drifts right/left Gait velocity: slowed Gait velocity interpretation: <1.31 ft/sec, indicative of household ambulator General Gait Details: modA progressing to maxA for steadying with ambulation pt with increasingly staggering gait, attempting high guard positioning to increase balance   Stairs Stairs: (unable to attempt)             Balance Overall balance assessment: Needs assistance Sitting-balance support: Feet supported;No upper extremity supported Sitting balance-Leahy Scale: Fair     Standing balance support: No upper extremity supported;During functional activity;Single extremity supported Standing balance-Leahy Scale: Zero Standing balance comment: requires support to maintain balance                             Pertinent Vitals/Pain Pain Assessment: Faces Faces Pain Scale: Hurts little more Pain Location: L shoulder Pain Descriptors / Indicators: Grimacing;Guarding Pain Intervention(s): Repositioned;Monitored during session;Limited activity within patient's tolerance(placed L  arm in sling)    Home Living Family/patient expects to be discharged to:: Private residence Living Arrangements: Alone Available Help at Discharge: Other  (Comment)(None) Type of Home: Apartment Home Access: Stairs to enter Entrance Stairs-Rails: Left Entrance Stairs-Number of Steps: 22  Home Layout: One level Home Equipment: Grab bars - tub/shower      Prior Function Level of Independence: Independent         Comments: pt reports independence however has been noncompliant with taking BP medication and reports he has been weak lately         Extremity/Trunk Assessment   Upper Extremity Assessment Upper Extremity Assessment: LUE deficits/detail LUE Deficits / Details: L reverse shoulder arthroplasty LUE: Unable to fully assess due to immobilization    Lower Extremity Assessment Lower Extremity Assessment: RLE deficits/detail;LLE deficits/detail RLE Deficits / Details: requests braces for knee OA, prior to ambulation, hip and knee strength grossly 3+/5 RLE Sensation: history of peripheral neuropathy RLE Coordination: decreased fine motor;decreased gross motor LLE Deficits / Details: requests braces for knee OA, prior to ambulation, hip and knee strength grossly 3+/5 LLE Sensation: history of peripheral neuropathy LLE Coordination: decreased fine motor;decreased gross motor       Communication   Communication: No difficulties  Cognition Arousal/Alertness: Awake/alert Behavior During Therapy: Impulsive;Restless;Anxious;Flat affect Overall Cognitive Status: Impaired/Different from baseline Area of Impairment: Attention;Following commands;Safety/judgement;Awareness;Problem solving                   Current Attention Level: Selective   Following Commands: Follows one step commands with increased time Safety/Judgement: Decreased awareness of safety;Decreased awareness of deficits Awareness: Emergent Problem Solving: Slow processing;Difficulty sequencing;Requires verbal cues;Requires tactile cues General Comments: pt with decreased knowledge of precautions and safety awareness, requires increased time for processing        General Comments General comments (skin integrity, edema, etc.): Pt assisted with placement of sling on entry, increased vc for not using L UE for activities like scooting to EoB and power up to standing.        Assessment/Plan    PT Assessment Patient needs continued PT services  PT Problem List Decreased strength;Decreased range of motion;Decreased activity tolerance;Decreased balance;Decreased mobility;Decreased coordination;Decreased cognition;Decreased safety awareness;Decreased knowledge of precautions;Pain;Impaired sensation       PT Treatment Interventions DME instruction;Gait training;Stair training;Functional mobility training;Therapeutic activities;Therapeutic exercise;Cognitive remediation;Patient/family education;Balance training    PT Goals (Current goals can be found in the Care Plan section)  Acute Rehab PT Goals Patient Stated Goal: go home today PT Goal Formulation: With patient Time For Goal Achievement: 06/13/18    Frequency Min 3X/week    AM-PAC PT "6 Clicks" Mobility  Outcome Measure Help needed turning from your back to your side while in a flat bed without using bedrails?: A Lot Help needed moving from lying on your back to sitting on the side of a flat bed without using bedrails?: A Lot Help needed moving to and from a bed to a chair (including a wheelchair)?: A Lot Help needed standing up from a chair using your arms (e.g., wheelchair or bedside chair)?: A Lot Help needed to walk in hospital room?: Total Help needed climbing 3-5 steps with a railing? : Total 6 Click Score: 10    End of Session         PT Visit Diagnosis: Unsteadiness on feet (R26.81);Other abnormalities of gait and mobility (R26.89);Muscle weakness (generalized) (M62.81);Repeated falls (R29.6);History of falling (Z91.81);Difficulty in walking, not elsewhere classified (R26.2);Ataxic gait (R26.0);Other symptoms and signs involving the  nervous system (R29.898);Pain Pain -  Right/Left: Left Pain - part of body: Shoulder    Time: 3532-9924 PT Time Calculation (min) (ACUTE ONLY): 28 min   Charges:   PT Evaluation $PT Eval Moderate Complexity: 1 Mod PT Treatments $Gait Training: 8-22 mins        Naveah Brave B. Migdalia Dk PT, DPT Acute Rehabilitation Services Pager [phone] Office [phone]   [place] 05/30/2018, 10:45 AM

## 2018-05-30 NOTE — Discharge Instructions (Signed)
Ophelia Charter MD, MPH [place] [address], [address] [phone] (tel)   [phone] (fax)   [place] REPLACEMENT    WOUND CARE ? You may leave the operative dressing in place until your follow-up appointment.   You have a prevena dressing, please charge it nightly, if suction stops you MUST return to my office to have it changed.   ? Use the Cryocuff, GameReady or Ice as often as possible for the first 3-4 days, then as needed for pain relief.  ? You may shower on Post-Op Day #2. The dressing is water resistant but do not scrub it as it may start to peel up.  You may remove the sling for showering, but keep a water resistant pillow under the arm to keep both the elbow and shoulder away from the body (mimicking the abduction sling). Gently pat the area dry. Do not soak the shoulder in water. Do not go swimming in the pool or ocean until your sutures are removed.  EXERCISES ? Wear the sling at all times except when doing your exercises. You may remove the sling for showering, but keep the arm across the chest or in a secondary sling.   Accidental/Purposeful External Rotation and shoulder flexion (reaching behind you) is to be avoided at all costs for the first month. ? Please perform the exercises:    Elbow / Hand / Wrist  Range of Motion Exercises POST-OP ? A multi-modal approach will be used to treat your pain.  Oxycodone - This is a strong narcotic, to be used only on an as needed basis for pain.  Meloxicam- An anti-inflammatory medication  Acetaminophen - A non-narcotic pain medicine.  Use 1000mg  three times a day for the first 14 days after surgery ? If you have any adverse effects with the medications, please call our office.  FOLLOW-UP ? If you develop a Fever (>101.5), Redness or Drainage from the surgical incision site, please call our office to arrange for an evaluation. ? Please call the office to schedule a  follow-up appointment for a wound check, 7-10 days post-operatively.    IF YOU HAVE ANY QUESTIONS, PLEASE FEEL FREE TO CALL OUR OFFICE.   HELPFUL INFORMATION  ? Your arm will be in a sling following surgery. You will be in this sling for the next 3-4 weeks.  I will let you know the exact duration at your follow-up visit.  ? You may be more comfortable sleeping in a semi-seated position the first few nights following surgery.  Keep a pillow propped under the elbow and forearm for comfort.  If you have a recliner type of chair it might be beneficial.  If not that is fine too, but it would be helpful to sleep propped up with pillows behind your operated shoulder as well under your elbow and forearm.  This will reduce pulling on the suture lines.  ? We suggest you use the pain medication the first night prior to going to bed, in order to ease any pain when the anesthesia wears off. You should avoid taking pain medications on an empty stomach as it will make you nauseous.  ? Do not drink alcoholic beverages or take illicit drugs when taking pain medications.  ? In most states it is against the law to drive while your arm is in a sling. And certainly against the law to drive while taking narcotics.  ? You may return to work/school in the next couple of  days when you feel up to it. Desk work and typing in the sling is fine.  ? When dressing, put your operative arm in the sleeve first.  When getting undressed, take your operative arm out last.  Loose fitting, button-down shirts are recommended.  ? Pain medication may make you constipated.  Below are a few solutions to try in this order: - Decrease the amount of pain medication if you arent having pain. - Drink lots of decaffeinated fluids. - Drink prune juice and/or each dried prunes  o If the first 3 dont work start with additional solutions - Take Colace - an over-the-counter stool softener - Take Senokot - an over-the-counter  laxative - Take Miralax - a stronger over-the-counter laxative

## 2018-05-30 NOTE — Progress Notes (Signed)
Pt conintue to take off sling and took of telemetry monitor and now refusing to put monitor back on. Dr. Hilbert Bible notified at this time

## 2018-05-31 ENCOUNTER — Inpatient Hospital Stay (HOSPITAL_COMMUNITY): Payer: Medicaid Other

## 2018-05-31 DIAGNOSIS — E876 Hypokalemia: Secondary | ICD-10-CM

## 2018-05-31 LAB — COMPREHENSIVE METABOLIC PANEL
ALBUMIN: 2.8 g/dL — AB (ref 3.5–5.0)
ALT: 24 U/L (ref 0–44)
ALT: 32 U/L (ref 0–44)
AST: 84 U/L — ABNORMAL HIGH (ref 15–41)
AST: 119 U/L — ABNORMAL HIGH (ref 15–41)
Albumin: 2.3 g/dL — ABNORMAL LOW (ref 3.5–5.0)
Alkaline Phosphatase: 38 U/L (ref 38–126)
Alkaline Phosphatase: 56 U/L (ref 38–126)
Anion gap: 8 (ref 5–15)
Anion gap: 7 (ref 5–15)
BUN: 6 mg/dL (ref 6–20)
BUN: 7 mg/dL (ref 6–20)
CHLORIDE: 107 mmol/L (ref 98–111)
CO2: 23 mmol/L (ref 22–32)
CO2: 24 mmol/L (ref 22–32)
Calcium: 8.2 mg/dL — ABNORMAL LOW (ref 8.9–10.3)
Calcium: 8.9 mg/dL (ref 8.9–10.3)
Chloride: 107 mmol/L (ref 98–111)
Creatinine, Ser: 1.2 mg/dL (ref 0.61–1.24)
Creatinine, Ser: 1.09 mg/dL (ref 0.61–1.24)
GFR calc Af Amer: >60 mL/min (ref >60)
GFR calc non Af Amer: >60 mL/min (ref >60)
GFR calc non Af Amer: >60 mL/min (ref >60)
GLUCOSE: 104 mg/dL — AB (ref 70–99)
Glucose, Bld: 111 mg/dL — ABNORMAL HIGH (ref 70–99)
Potassium: 3.4 mmol/L — ABNORMAL LOW (ref 3.5–5.1)
Potassium: 3.7 mmol/L (ref 3.5–5.1)
SODIUM: 138 mmol/L (ref 135–145)
Sodium: 138 mmol/L (ref 135–145)
Total Bilirubin: 1 mg/dL (ref 0.3–1.2)
Total Bilirubin: 2.7 mg/dL — ABNORMAL HIGH (ref 0.3–1.2)
Total Protein: 5.1 g/dL — ABNORMAL LOW (ref 6.5–8.1)
Total Protein: 6.5 g/dL (ref 6.5–8.1)

## 2018-05-31 LAB — CBC
HCT: 18.1 % — ABNORMAL LOW (ref 39.0–52.0)
HCT: 23.9 % — ABNORMAL LOW (ref 39.0–52.0)
Hemoglobin: 6.4 g/dL — CL (ref 13.0–17.0)
Hemoglobin: 8.4 g/dL — ABNORMAL LOW (ref 13.0–17.0)
MCH: 33.2 pg (ref 26.0–34.0)
MCH: 32.7 pg (ref 26.0–34.0)
MCHC: 35.4 g/dL (ref 30.0–36.0)
MCHC: 35.1 g/dL (ref 30.0–36.0)
MCV: 93.8 fL (ref 80.0–100.0)
MCV: 93 fL (ref 80.0–100.0)
Platelets: 145 10*3/uL — ABNORMAL LOW (ref 150–400)
Platelets: 202 10*3/uL (ref 150–400)
RBC: 1.93 MIL/uL — ABNORMAL LOW (ref 4.22–5.81)
RBC: 2.57 MIL/uL — ABNORMAL LOW (ref 4.22–5.81)
RDW: 13.5 % (ref 11.5–15.5)
RDW: 14.2 % (ref 11.5–15.5)
WBC: 10.8 10*3/uL — ABNORMAL HIGH (ref 4.0–10.5)
WBC: 13.2 10*3/uL — AB (ref 4.0–10.5)
nRBC: 0 % (ref 0.0–0.2)
nRBC: 0 % (ref 0.0–0.2)

## 2018-05-31 LAB — BRAIN NATRIURETIC PEPTIDE: B Natriuretic Peptide: 115.1 pg/mL — ABNORMAL HIGH (ref 0.0–100.0)

## 2018-05-31 LAB — GLUCOSE, CAPILLARY: Glucose-Capillary: 125 mg/dL — ABNORMAL HIGH (ref 70–99)

## 2018-05-31 LAB — MAGNESIUM: Magnesium: 1.7 mg/dL (ref 1.7–2.4)

## 2018-05-31 LAB — FOLATE RBC
FOLATE, RBC: 1570 ng/mL (ref >498)
Folate, Hemolysate: 325 ng/mL
Hematocrit: 20.7 % — ABNORMAL LOW (ref 37.5–51.0)

## 2018-05-31 LAB — TYPE AND SCREEN
ABO/RH(D): O POS
Antibody Screen: NEGATIVE

## 2018-05-31 LAB — T4, FREE: Free T4: 1.02 ng/dL (ref 0.82–1.77)

## 2018-05-31 LAB — PREPARE RBC (CROSSMATCH)

## 2018-05-31 MED ORDER — THIAMINE HCL 100 MG PO TABS: 100.0000 mg | ORAL_TABLET | Freq: Every day | ORAL | 0 refills | Status: AC

## 2018-05-31 MED ORDER — POTASSIUM CHLORIDE 10 MEQ/100ML IV SOLN
10.0000 meq | INTRAVENOUS | Status: AC
  Administered 2018-05-31 (×4): 10 meq via INTRAVENOUS
  Filled 2018-05-31 (×4): qty 100

## 2018-05-31 MED ORDER — POTASSIUM CHLORIDE CRYS ER 20 MEQ PO TBCR
20.0000 meq | EXTENDED_RELEASE_TABLET | Freq: Once | ORAL | Status: AC
  Administered 2018-05-31: 20 meq via ORAL
  Filled 2018-05-31: qty 1

## 2018-05-31 MED ORDER — SODIUM CHLORIDE 0.9% IV SOLUTION
Freq: Once | INTRAVENOUS | Status: AC
  Administered 2018-05-31: 12:00:00 via INTRAVENOUS

## 2018-05-31 MED ORDER — LACTULOSE 10 GM/15ML PO SOLN
30.0000 g | ORAL | Status: DC
  Administered 2018-05-31 (×2): 30 g via ORAL
  Filled 2018-05-31 (×2): qty 45

## 2018-05-31 NOTE — Progress Notes (Signed)
Dr. Hilbert Bible notified of patient hgb of 6.4 this am

## 2018-05-31 NOTE — Progress Notes (Signed)
Discharge information reviewed with patient. All questions answered. Patient is waiting on friend, stephanie for a ride home.

## 2018-05-31 NOTE — Discharge Summary (Signed)
Physician Discharge Summary  Tristan Cook CHE:[id number] DOB: 07-May-1960 DOA: 05/29/2018  PCP: Wyatt Haste, NP  Admit date: 05/29/2018 Discharge date: 05/31/2018  Admitted From: Home  Disposition:  Home   Recommendations for Outpatient Follow-up:  1. Follow up with PCP in 1-2 weeks 2. Please obtain BMP/CBC in one week your next doctors visit.  3. Pain medications for his shoulder 4. Follow up with Ortho as listed.  5. Advised to quit drinking alcohol   Home Health: Equipment/Devices: Discharge Condition: Stable CODE STATUS:  Diet recommendation:   Brief/Interim Summary: 58 year old with a history of alcohol abuse, essential hypertension, hyperlipidemia, TIA, GERD, gout, depression, peripheral neuropathy came to the hospital for evaluation of syncope and left shoulder pain.  Patient was transferred here from [facility] rocking him for treatment for complicated proximal left humerus fracture.  Patient underwent left ORIF with wound VAC placement on 3/18 which he tolerated well.  Postoperatively he showed signs of slight confusion and delirium.  Following day his hemoglobin dropped below 7 therefore he was given 1 unit of PRBC.  Ammonia levels were slightly elevated therefore received lactulose.  The following day his mentation cleared up and was back to baseline. Physical therapy recommended skilled nursing facility initially but due to lack of insurance he was difficult to place him.  He decided to go home. Patient is reconnection benefit from hospital stay and stable for discharge today after his 1 unit PRBC transfusion.  He can follow-up outpatient with orthopedic and primary care provider.    Discharge Diagnoses:  Principal Problem:   Closed fracture of left proximal humerus Active Problems:   TIA (transient ischemic attack)   Syncope   Alcohol abuse   Fall   Dislocation of left shoulder joint   Abnormal LFTs   Hypokalemia  Closed left proximal humerus/left shoulder fracture  and dislocation - Seen by orthopedic.  Status post ORIF 3/18.  Pain control, dressing per surgical team.  Vac in place.  PT/OT ordered.  Will need to follow-up outpatient orthopedic in 1 week, follow-up arrangements to be made by their service.  Delirium, alcohol use Elevated ammonia, hepatic encephalopathy - Mentation is clear this morning.  We will give him couple doses of lactulose.  Doubt he will require this long time but can follow-up outpatient with his PCP to see if he continues to require this.  Acute blood loss anemia - Hemoglobin 6.4.  No obvious evidence of blood loss.  Likely due to surgery.  Will transfuse 1 unit PRBC prior to discharge.  Hypokalemia -Repletion ordered.  History of TIA -Continue aspirin  Syncope/fall prior to admission -CT of the head is negative.  No focal neurologic sign.  Continue neurochecks  Transaminitis; resolved.  -Alcohol pattern.  Acute hepatitis panel negative.  HCV antibody negative.  HIV negative.  Consultations:  Ortho  Subjective: Mentation back to baseline. Family agrees. Wishes to go home. No other complaints.   Discharge Exam: Vitals:   05/30/18 2121 05/31/18 0850  BP: (!) 118/58 (!) 141/79  Pulse: (!) 107 (!) 118  Resp: 18 20  Temp: 98.2 F (36.8 C) 98.4 F (36.9 C)  SpO2: 100% 100%   Vitals:   05/29/18 2213 05/30/18 1336 05/30/18 2121 05/31/18 0850  BP: 107/72 122/80 (!) 118/58 (!) 141/79  Pulse: 99 (!) 101 (!) 107 (!) 118  Resp: 20 18 18 20   Temp: 97.7 F (36.5 C) 98.9 F (37.2 C) 98.2 F (36.8 C) 98.4 F (36.9 C)  TempSrc: Oral Oral Oral Oral  SpO2: 100% 98% 100% 100%  Weight:      Height:        General: Pt is alert, awake, not in acute distress Cardiovascular: RRR, S1/S2 +, no rubs, no gallops Respiratory: CTA bilaterally, no wheezing, no rhonchi Abdominal: Soft, NT, ND, bowel sounds + Extremities: no edema, no cyanosis Left arm dressing and splint in place.   Discharge  Instructions   Allergies as of 05/31/2018      Reactions   Hydrocodone    hallucinations from hydrocodone   Benadryl [diphenhydramine Hcl (sleep)] Other (See Comments)   Palpitations      Medication List    STOP taking these medications   FLUoxetine 20 MG tablet Commonly known as:  PROZAC     TAKE these medications   acetaminophen 500 MG tablet Commonly known as:  TYLENOL Take 2 tablets (1,000 mg total) by mouth every 8 (eight) hours for 14 days.   aspirin EC 81 MG tablet Take 81 mg by mouth daily.   Calcium 500/D 500-200 MG-UNIT tablet Generic drug:  calcium-vitamin D Take 1 tablet by mouth daily with breakfast.   folic acid 448 MCG tablet Commonly known as:  FOLVITE Take 400 mcg by mouth daily.   meloxicam 7.5 MG tablet Commonly known as:  Mobic Take 1 tablet (7.5 mg total) by mouth daily.   metoprolol tartrate 50 MG tablet Commonly known as:  LOPRESSOR Take 25 mg by mouth 2 (two) times daily.   metoprolol tartrate 25 MG tablet Commonly known as:  LOPRESSOR Take 1 tablet (25 mg total) by mouth 2 (two) times daily.   multivitamin with minerals Tabs tablet Take 1 tablet by mouth daily.   omeprazole 20 MG capsule Commonly known as:  PRILOSEC Take 1 capsule (20 mg total) by mouth daily.   ondansetron 4 MG tablet Commonly known as:  Zofran Take 1 tablet (4 mg total) by mouth every 8 (eight) hours as needed for up to 7 days for nausea or vomiting.   oxyCODONE 5 MG immediate release tablet Commonly known as:  Oxy IR/ROXICODONE Take 1-2 pills every 6 hrs as needed for pain, no more than 6 per day Notes to patient:  NO MORE THAN 6 PILLS PER DAY   senna 8.6 MG Tabs tablet Commonly known as:  SENOKOT Take 1 tablet by mouth as needed for mild constipation. Notes to patient:  Take with full glass of water   thiamine 100 MG tablet Take 1 tablet (100 mg total) by mouth daily. What changed:  Another medication with the same name was added. Make sure you  understand how and when to take each.   thiamine 100 MG tablet Take 1 tablet (100 mg total) by mouth daily for 30 days. Start taking on:  June 01, 2018 What changed:  You were already taking a medication with the same name, and this prescription was added. Make sure you understand how and when to take each.   vitamin B-12 1000 MCG tablet Commonly known as:  CYANOCOBALAMIN Take 5,000 mcg by mouth daily.   Vitamin D-400 10 MCG (400 UNIT) Tabs tablet Generic drug:  cholecalciferol Take 400 Units by mouth daily.      Follow-up Information    Hiram Gash, MD. Schedule an appointment as soon as possible for a visit on 06/04/2018.   Specialty:  Orthopedic Surgery Contact information: [address] [place] [address] [phone]        [facility].  Go on 06/10/2018.   Why:  10 am., Dr. Ferdinand Lango information: [place] [address] [place], [place], NP. Schedule an appointment as soon as possible for a visit in 1 week(s).   Contact information: [address] [place]  [address] [phone]          Allergies  Allergen Reactions  . Hydrocodone     hallucinations from hydrocodone  . Benadryl [Diphenhydramine Hcl (Sleep)] Other (See Comments)    Palpitations    You were cared for by a hospitalist during your hospital stay. If you have any questions about your discharge medications or the care you received while you were in the hospital after you are discharged, you can call the unit and asked to speak with the hospitalist on call if the hospitalist that took care of you is not available. Once you are discharged, your primary care physician will handle any further medical issues. Please note that no refills for any discharge medications will be authorized once you are discharged, as it is imperative that you return to your primary care physician (or establish  a relationship with a primary care physician if you do not have one) for your aftercare needs so that they can reassess your need for medications and monitor your lab values.   Procedures/Studies: Dg Abd 1 View  Result Date: 05/31/2018 CLINICAL DATA:  Abdominal distension EXAM: ABDOMEN - 1 VIEW COMPARISON:  None. FINDINGS: There is moderate stool in the colon. There is no appreciable bowel dilatation or air-fluid level to suggest bowel obstruction. No free air. There are foci of arterial vascular calcification in the pelvis. Visualized lung bases are clear. IMPRESSION: Moderate stool in colon.  No bowel obstruction or free air evident. Electronically Signed   By: Lowella Grip III M.D.   On: 05/31/2018 07:47   Ct Shoulder Left Wo Contrast  Result Date: 05/29/2018 CLINICAL DATA:  Patient status post syncope x2 4 days ago with a left shoulder injury. Pain. The patient underwent attempted intraoperative reduction of fracture dislocation 05/28/2018. Initial encounter. EXAM: CT OF THE UPPER LEFT EXTREMITY WITHOUT CONTRAST TECHNIQUE: Multidetector CT imaging of the upper left extremity was performed according to the standard protocol. COMPARISON:  Plain films left shoulder 05/27/2018. FINDINGS: Bones/Joint/Cartilage The patient has a highly comminuted fracture of the head and neck of the left humerus. Approximately the anterior 50% of the humeral head is a single fragment which is anteriorly dislocated and medially displaced 2.8 cm. This fragment includes the lesser tuberosity. The posterior aspect of the humeral head is highly comminuted. The surgical neck of the humerus is also comminuted and laterally displaced approximately 1.5 shaft widths. The patient also has an acute fracture of the distal 1.5 cm of the coracoid process which is laterally displaced 1 cm. The acromioclavicular joint is intact with moderate degenerative change present. The acromion is type 1. Ligaments Suboptimally assessed by CT.  Muscles and Tendons The rotator cuff is extremely difficult to assess due to extensive comminution of the humeral head and hematoma about the joint. The infraspinatus is visualized attaching to a fragment of the posterior aspect of the greater tuberosity. The subscapularis and supraspinatus can not be assessed. Soft tissues Scattered soft tissue gas is likely related to prior surgery. Imaged lung parenchyma is clear. IMPRESSION: Highly comminuted and displaced fracture of the surgical neck and head of the humerus. Approximately 50% of the humeral head is a  single fragment which is anteriorly dislocated and medially displaced along the glenoid. Fracture of the distal 1.5 cm of the coracoid process with lateral displacement of 1 cm. The rotator cuff is extremely difficult to assess. The infraspinatus is visualized attaching to a fragment of the humeral head. Electronically Signed   By: Inge Rise M.D.   On: 05/29/2018 09:40   Dg Shoulder Left  Result Date: 05/29/2018 CLINICAL DATA:  Left proximal humeral fracture EXAM: LEFT SHOULDER - 2+ VIEW; DG C-ARM 61-120 MIN COMPARISON:  05/28/2018, 05/27/2018 FLUOROSCOPY TIME:  Radiation Exposure Index (as provided by the fluoroscopic device): Not available If the device does not provide the exposure index: Fluoroscopy Time:  11 seconds Number of Acquired Images:  2 FINDINGS: Left shoulder replacement is now seen in satisfactory position. No gross soft tissue abnormality is noted. IMPRESSION: Left shoulder replacement. Electronically Signed   By: Inez Catalina M.D.   On: 05/29/2018 16:58   Dg Shoulder Left Port  Result Date: 05/29/2018 CLINICAL DATA:  Status post left shoulder prosthesis placement. EXAM: LEFT SHOULDER - 1 VIEW COMPARISON:  C-arm radiographs obtained earlier today, left shoulder CT dated 05/29/2018 and left shoulder radiographs dated 05/27/2018. FINDINGS: Interval left shoulder prosthesis in satisfactory position and alignment. Previously described  proximal humerus comminuted fracture with no new fractures seen. IMPRESSION: Satisfactory postoperative appearance of a left shoulder prosthesis. Electronically Signed   By: Claudie Revering M.D.   On: 05/29/2018 19:11   Dg C-arm 1-60 Min  Result Date: 05/29/2018 CLINICAL DATA:  Left proximal humeral fracture EXAM: LEFT SHOULDER - 2+ VIEW; DG C-ARM 61-120 MIN COMPARISON:  05/28/2018, 05/27/2018 FLUOROSCOPY TIME:  Radiation Exposure Index (as provided by the fluoroscopic device): Not available If the device does not provide the exposure index: Fluoroscopy Time:  11 seconds Number of Acquired Images:  2 FINDINGS: Left shoulder replacement is now seen in satisfactory position. No gross soft tissue abnormality is noted. IMPRESSION: Left shoulder replacement. Electronically Signed   By: Inez Catalina M.D.   On: 05/29/2018 16:58      The results of significant diagnostics from this hospitalization (including imaging, microbiology, ancillary and laboratory) are listed below for reference.     Microbiology: Recent Results (from the past 240 hour(s))  MRSA PCR Screening     Status: None   Collection Time: 05/29/18  3:58 AM  Result Value Ref Range Status   MRSA by PCR NEGATIVE NEGATIVE Final    Comment:        The GeneXpert MRSA Assay (FDA approved for NASAL specimens only), is one component of a comprehensive MRSA colonization surveillance program. It is not intended to diagnose MRSA infection nor to guide or monitor treatment for MRSA infections. Performed at [facility], [place] [address], [place], [place] [address]      Labs: BNP (last 3 results) Recent Labs    05/31/18 0414  BNP 604.5*   Basic Metabolic Panel: Recent Labs  Lab 05/29/18 0329 05/30/18 0341 05/31/18 0414  NA 137 136 138  K 3.2* 4.1 3.4*  CL 106 105 107  CO2 24 23 23   GLUCOSE 115* 104* 111*  BUN 6 10 6   CREATININE 1.22 1.14 1.20  CALCIUM 8.5* 8.3* 8.2*  MG 1.8 1.7 1.7   Liver Function Tests: Recent Labs   Lab 05/31/18 0414  AST 84*  ALT 24  ALKPHOS 38  BILITOT 1.0  PROT 5.1*  ALBUMIN 2.3*   No results for input(s): LIPASE, AMYLASE in the last 168  hours. Recent Labs  Lab 05/30/18 1811  AMMONIA 47*   CBC: Recent Labs  Lab 05/29/18 0329 05/30/18 0341 05/31/18 0414  WBC 11.8* 15.1* 10.8*  HGB 8.3* 7.7* 6.4*  HCT 23.5* 21.9* 18.1*  MCV 92.9 94.0 93.8  PLT 128* 134* 145*   Cardiac Enzymes: No results for input(s): CKTOTAL, CKMB, CKMBINDEX, TROPONINI in the last 168 hours. BNP: Invalid input(s): POCBNP CBG: Recent Labs  Lab 05/31/18 0752  GLUCAP 125*   D-Dimer No results for input(s): DDIMER in the last 72 hours. Hgb A1c No results for input(s): HGBA1C in the last 72 hours. Lipid Profile No results for input(s): CHOL, HDL, LDLCALC, TRIG, CHOLHDL, LDLDIRECT in the last 72 hours. Thyroid function studies Recent Labs    05/30/18 1811  TSH 0.224*   Anemia work up Recent Labs    05/30/18 1811  VITAMINB12 3,939*   Urinalysis    Component Value Date/Time   COLORURINE YELLOW 04/22/2017 [place] 04/22/2017 1637   LABSPEC 1.011 04/22/2017 1637   PHURINE 7.0 04/22/2017 1637   GLUCOSEU NEGATIVE 04/22/2017 1637   GLUCOSEU NEG mg/dL 08/28/2006 1728   HGBUR SMALL (A) 04/22/2017 1637   HGBUR negative 06/16/2008 1509   BILIRUBINUR NEGATIVE 04/22/2017 1637   BILIRUBINUR negative 03/29/2015 1419   KETONESUR NEGATIVE 04/22/2017 1637   PROTEINUR NEGATIVE 04/22/2017 1637   UROBILINOGEN 0.2 03/29/2015 1419   UROBILINOGEN 0.2 10/20/2010 0432   NITRITE NEGATIVE 04/22/2017 1637   LEUKOCYTESUR NEGATIVE 04/22/2017 1637   Sepsis Labs Invalid input(s): PROCALCITONIN,  WBC,  LACTICIDVEN Microbiology Recent Results (from the past 240 hour(s))  MRSA PCR Screening     Status: None   Collection Time: 05/29/18  3:58 AM  Result Value Ref Range Status   MRSA by PCR NEGATIVE NEGATIVE Final    Comment:        The GeneXpert MRSA Assay (FDA approved for NASAL  specimens only), is one component of a comprehensive MRSA colonization surveillance program. It is not intended to diagnose MRSA infection nor to guide or monitor treatment for MRSA infections. Performed at [facility], [place] [address], [place], [place] [address]      Time coordinating discharge:  I have spent 35 minutes face to face with the patient and on the ward discussing the patients care, assessment, plan and disposition with other care givers. >50% of the time was devoted counseling the patient about the risks and benefits of treatment/Discharge disposition and coordinating care.   SIGNED:   Damita Lack, MD  [facility] 05/31/2018, 11:07 AM   If 7PM-7AM, please contact night-coverage [email or web address]

## 2018-05-31 NOTE — Progress Notes (Signed)
Pt's family member, Colletta Maryland will be coming to town to assist in taking care of him. She lives out of town, but is currently working from home d/t quarantine from [facility] [employer]. She plans to be his ride home from the hospital today.

## 2018-05-31 NOTE — Evaluation (Signed)
Occupational Therapy Evaluation and discharge Patient Details Name: Tristan Cook MRN: [id number] DOB: Aug 09, 1960 Today's Date: 05/31/2018    History of Present Illness 58 y.o. male who sustained a fall 4 days ago and resultant fracture dislocation of the proximal humerus.  He was at [facility] and a provider there attempted an open reduction but failed to get the joint reduced.  He then was closed and a dressing was applied and he was sent to [facility] for further evaluation. 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 L open reduction shoulder dislocation, L reverse shoulder arthroplasty with wound vac placement.   Clinical Impression   This 58 yo male admitted and underwent above presents to acute OT with decreased balance and decreased use of LUE all affecting his safety and independence with basic ADLs. He would truly benefit from rehab at SNF, but pt politely refusing so full [facility] services recommended as well as 24 hour S/prn A. Pt to D/C today so we will D/C from acute OT.    Follow Up Recommendations  SNF;Other (comment)(pt not open to SNF, so recommend HHOT, HHPT, HHAid); 24 hour S    Equipment Recommendations  None recommended by OT       Precautions / Restrictions Precautions Precautions: Fall Precaution Comments: hx of falling, humeral fx result of fall Required Braces or Orthoses: Other Brace;Sling Other Brace: L shoulder ABD sling with wound vac placement Restrictions Weight Bearing Restrictions: Yes LUE Weight Bearing: Non weight bearing      Mobility Bed Mobility Overal bed mobility: Modified Independent                Transfers Overall transfer level: Needs assistance Equipment used: Straight cane Transfers: Sit to/from Stand           General transfer comment: min A to ambulate 100 feet with SPC with 2 LOB that one had to A'd to  correct. Recommended to pt that he concentrate on walking and not be looking all around and talking at same time.    Balance Overall balance assessment: Needs assistance Sitting-balance support: Feet supported;No upper extremity supported Sitting balance-Leahy Scale: Good     Standing balance support: Single extremity supported;During functional activity Standing balance-Leahy Scale: Poor Standing balance comment: requires support in dynamic standing, fair in static standing                                   Vision Patient Visual Report: No change from baseline              Pertinent Vitals/Pain Pain Assessment: No/denies pain     Hand Dominance Right   Extremity/Trunk Assessment Upper Extremity Assessment Upper Extremity Assessment: LUE deficits/detail LUE Deficits / Details: L reverse shoulder arthroplasty LUE Coordination: decreased gross motor           Communication Communication Communication: No difficulties   Cognition Arousal/Alertness: Awake/alert Behavior During Therapy: Impulsive Overall Cognitive Status: Impaired/Different from baseline Area of Impairment: Attention;Following commands;Safety/judgement;Awareness                   Current Attention Level: Selective   Following Commands: Follows one step commands consistently Safety/Judgement: Decreased awareness of safety Awareness: Emergent   General Comments: Pt more aware of precautions today      Exercises Other Exercises Other Exercises: Pt  completed 10 reps of elbow to hand exercises (elbow in standing and the rest in sitting). He is aware he needs to do these 3 times a day 10 reps each   Shoulder Instructions Shoulder Instructions Donning/doffing shirt without moving shoulder: (pt able to independently talk me through the way he is suppose to do this) Method for sponge bathing under operated UE: (pt verbalizes understanding to use dangle method and not to actively move  his shoulder) Donning/doffing sling/immobilizer: Modified independent(increased time) Correct positioning of sling/immobilizer: Independent Pendulum exercises (written home exercise program): (NA) ROM for elbow, wrist and digits of operated UE: Independent Sling wearing schedule (on at all times/off for ADL's): Independent Proper positioning of operated UE when showering: Independent Dressing change: (NA) Positioning of UE while sleeping: (verbalizes understanding )    Home Living Family/patient expects to be discharged to:: Private residence Living Arrangements: Alone Available Help at Discharge: Other (Comment)(friend) Type of Home: Apartment Home Access: Stairs to enter Entrance Stairs-Number of Steps: 22  Entrance Stairs-Rails: Left Home Layout: One level     Bathroom Shower/Tub: Tub/shower unit;Curtain   Biochemist, clinical: Standard     Home Equipment: Grab bars - tub/shower          Prior Functioning/Environment Level of Independence: Independent        Comments: pt reports independence however has been noncompliant with taking BP medication and reports he has been weak lately         OT Problem List: Decreased strength;Decreased range of motion;Impaired balance (sitting and/or standing);Impaired UE functional use;Decreased safety awareness      OT Treatment/Interventions: Self-care/ADL training;Patient/family education;Therapeutic exercise;Balance training    OT Goals(Current goals can be found in the care plan section) Acute Rehab OT Goals Patient Stated Goal: to go home today  OT Frequency:     Barriers to D/C: Decreased caregiver support             AM-PAC OT "6 Clicks" Daily Activity     Outcome Measure Help from another person eating meals?: None Help from another person taking care of personal grooming?: A Little Help from another person toileting, which includes using toliet, bedpan, or urinal?: A Little Help from another person bathing  (including washing, rinsing, drying)?: A Little Help from another person to put on and taking off regular upper body clothing?: A Little Help from another person to put on and taking off regular lower body clothing?: A Little 6 Click Score: 19   End of Session Equipment Utilized During Treatment: Gait belt(SPC, sling) Nurse Communication: Mobility status  Activity Tolerance: Patient tolerated treatment well Patient left: (sitting EOB)  OT Visit Diagnosis: Unsteadiness on feet (R26.81);History of falling (Z91.81)                Time: 4128-7867 OT Time Calculation (min): 46 min Charges:  OT General Charges $OT Visit: 1 Visit OT Evaluation $OT Eval Moderate Complexity: 1 Mod OT Treatments $Self Care/Home Management : 8-22 mins $Therapeutic Exercise: 8-22 mins  Tristan Cook, OTR/L Acute [employer] Pager [phone] Office [phone]     Tristan Cook 05/31/2018, 2:11 PM

## 2018-05-31 NOTE — Progress Notes (Signed)
Pt trying to get out of bed by himself and pulled out his IV.

## 2018-05-31 NOTE — Progress Notes (Signed)
Physical Therapy Treatment Patient Details Name: Tristan Cook MRN: [id number] DOB: [birth year] Today's Date: 05/31/2018    History of Present Illness 58 y.o. male who sustained a fall 4 days ago and resultant fracture dislocation of the proximal humerus.  He was at [facility] and a provider there attempted an open reduction but failed to get the joint reduced.  He then was closed and a dressing was applied and he was sent to [facility] for further evaluation. 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 L open reduction shoulder dislocation, L reverse shoulder arthroplasty with wound vac placement.    PT Comments    Pt progressing towards physical therapy goals, however safety awareness continues to be decreased and pt is at a high risk for falls. Focus of session was stair training prior to d/c today, as pt has 22 stairs to enter his apartment. Pt consistently requiring min assist for balance support and safety, and requires frequent reminders not to use LUE. Pt reports he will be getting his own cane "because it's cheaper", however strongly feel he needs the cane prior to leaving the hospital. Will continue to follow and progress as able per POC.     Follow Up Recommendations  SNF     Equipment Recommendations  Cane    Recommendations for Other Services OT consult     Precautions / Restrictions Precautions Precautions: Fall Precaution Comments: hx of falling, humeral fx result of fall Required Braces or Orthoses: Other Brace;Sling Other Brace: L shoulder ABD sling with wound vac placement Restrictions Weight Bearing Restrictions: Yes LUE Weight Bearing: Non weight bearing    Mobility  Bed Mobility Overal bed mobility: Modified Independent             General bed mobility comments: sitting EOB on entry  Transfers Overall transfer level: Needs  assistance Equipment used: None Transfers: Sit to/from Stand Sit to Stand: Min assist         General transfer comment: Assist for balance support as pt powered up to full standing position. Increased time to gain/maintain balance once standing and used IV pole for support.   Ambulation/Gait Ambulation/Gait assistance: Min assist Gait Distance (Feet): 100 Feet Assistive device: IV Pole Gait Pattern/deviations: Step-through pattern;Shuffle;Decreased stride length;Staggering left;Staggering right;Leaning posteriorly;Drifts right/left Gait velocity: Decreased Gait velocity interpretation: 1.31 - 2.62 ft/sec, indicative of limited community ambulator General Gait Details: Consistent min assist for balance throughout gait training. Pt holding to IV pole for support.    Stairs Stairs: Yes Stairs assistance: Min assist Stair Management: One rail Right;Step to pattern;Alternating pattern;Forwards;Sideways Number of Stairs: 5 General stair comments: Pt was able to negotiate 5 stairs ascending facing forwards with alternating step pattern and R rail, and descending sideways with step-to pattern. Pt attemtping to use LUE throughout descent.    Wheelchair Mobility    Modified Rankin (Stroke Patients Only)       Balance Overall balance assessment: Needs assistance Sitting-balance support: Feet supported;No upper extremity supported Sitting balance-Leahy Scale: Good     Standing balance support: Single extremity supported;During functional activity Standing balance-Leahy Scale: Poor Standing balance comment: requires support in dynamic standing, fair in static standing                            Cognition Arousal/Alertness: Awake/alert Behavior During Therapy: Impulsive Overall Cognitive Status: Impaired/Different from baseline  Area of Impairment: Attention;Following commands;Safety/judgement;Awareness                   Current Attention Level: Selective    Following Commands: Follows one step commands consistently Safety/Judgement: Decreased awareness of safety Awareness: Emergent Problem Solving: Slow processing;Difficulty sequencing;Requires verbal cues;Requires tactile cues General Comments: Consistently using LUE throughout session despite cues for NWB.       Exercises Other Exercises Other Exercises: Pt completed 10 reps of elbow to hand exercises (elbow in standing and the rest in sitting). He is aware he needs to do these 3 times a day 10 reps each Donning/doffing shirt without moving shoulder: (pt able to independently talk me through the way he is suppose to do this) Method for sponge bathing under operated UE: (pt verbalizes understanding to use dangle method and not to actively move his shoulder) Donning/doffing sling/immobilizer: Modified independent(increased time) Correct positioning of sling/immobilizer: Independent Pendulum exercises (written home exercise program): (NA) ROM for elbow, wrist and digits of operated UE: Independent Sling wearing schedule (on at all times/off for ADL's): Independent Proper positioning of operated UE when showering: Independent Dressing change: (NA) Positioning of UE while sleeping: (verbalizes understanding )    General Comments        Pertinent Vitals/Pain Pain Assessment: Faces Faces Pain Scale: Hurts a little bit Pain Location: L shoulder Pain Descriptors / Indicators: Grimacing;Guarding Pain Intervention(s): Monitored during session    Home Living Family/patient expects to be discharged to:: Private residence Living Arrangements: Alone Available Help at Discharge: Other (Comment)(friend) Type of Home: Apartment Home Access: Stairs to enter Entrance Stairs-Rails: Left Home Layout: One level Home Equipment: Grab bars - tub/shower      Prior Function Level of Independence: Independent      Comments: pt reports independence however has been noncompliant with taking BP  medication and reports he has been weak lately    PT Goals (current goals can now be found in the care plan section) Acute Rehab PT Goals Patient Stated Goal: to go home today PT Goal Formulation: With patient Time For Goal Achievement: 06/13/18 Progress towards PT goals: Progressing toward goals    Frequency    Min 3X/week      PT Plan Current plan remains appropriate    Co-evaluation              AM-PAC PT "6 Clicks" Mobility   Outcome Measure  Help needed turning from your back to your side while in a flat bed without using bedrails?: A Lot Help needed moving from lying on your back to sitting on the side of a flat bed without using bedrails?: A Lot Help needed moving to and from a bed to a chair (including a wheelchair)?: A Little Help needed standing up from a chair using your arms (e.g., wheelchair or bedside chair)?: A Little Help needed to walk in hospital room?: A Little Help needed climbing 3-5 steps with a railing? : A Little 6 Click Score: 16    End of Session Equipment Utilized During Treatment: Gait belt;Other (comment)(L shoulder sling/abduction brace) Activity Tolerance: Patient tolerated treatment well Patient left: with call bell/phone within reach;with nursing/sitter in room(Sitting EOB) Nurse Communication: Mobility status PT Visit Diagnosis: Unsteadiness on feet (R26.81);Other abnormalities of gait and mobility (R26.89);Muscle weakness (generalized) (M62.81);Repeated falls (R29.6);History of falling (Z91.81);Difficulty in walking, not elsewhere classified (R26.2);Ataxic gait (R26.0);Other symptoms and signs involving the nervous system (R29.898);Pain Pain - Right/Left: Left Pain - part of body: Shoulder     Time:  5784-6962 PT Time Calculation (min) (ACUTE ONLY): 15 min  Charges:  $Gait Training: 8-22 mins                     Rolinda Roan, PT, DPT Acute Rehabilitation Services Pager: [phone] Office: [phone]    Thelma Comp 05/31/2018, 2:58 PM

## 2018-05-31 NOTE — TOC Progression Note (Addendum)
Transition of Care [facility]) - Progression Note    Patient Details  Name: Tristan Cook MRN: [id number] Date of Birth: 04-13-1960  [facility] CM/SW Contact  Sharin Mons, RN Phone Number: 05/31/2018, 10:41 AM  Clinical Narrative:    Pt has declined SNF placement. [facility] for home health services was declined. Pt states has no family /friends to assist him once d/c to home.    Expected Discharge Plan: [place] Barriers to Discharge: Barriers Resolved  Expected Discharge Plan and Services Expected Discharge Plan: [place]   Discharge Planning Services: Follow-up appt scheduled, CM Consult, [place] Acute Care Choice: Home Health([facility] care) Living arrangements for the past 2 months: Apartment Expected Discharge Date: 05/31/18                 05/31/2018  Pt states will purchase DME cane from medical supply store or pharmacy once d/c.       Social Determinants of Health (SDOH) Interventions    Readmission Risk Interventions No flowsheet data found.

## 2018-06-01 LAB — BPAM RBC
Blood Product Expiration Date: 202004042359
ISSUE DATE / TIME: 202003200855
Unit Type and Rh: 5100

## 2018-06-01 LAB — TYPE AND SCREEN
ABO/RH(D): O POS
Antibody Screen: NEGATIVE
Unit division: 0

## 2018-06-01 LAB — T3, FREE: T3, Free: 2.3 pg/mL (ref 2.0–4.4)

## 2018-06-01 LAB — VITAMIN B1: Vitamin B1 (Thiamine): 118.8 nmol/L (ref 66.5–200.0)

## 2018-07-30 NOTE — Progress Notes (Signed)
REVIEWED-NO ADDITIONAL RECOMMENDATIONS. 

## 2018-08-12 ENCOUNTER — Other Ambulatory Visit (HOSPITAL_COMMUNITY): Payer: Self-pay

## 2018-08-12 ENCOUNTER — Other Ambulatory Visit (HOSPITAL_COMMUNITY)
Admission: RE | Admit: 2018-08-12 | Discharge: 2018-08-12 | Disposition: A | Discharge status: Home / Self Care | Payer: Self-pay | Source: Ambulatory Visit | Attending: Ophthalmology | Admitting: Ophthalmology

## 2018-08-12 ENCOUNTER — Encounter (HOSPITAL_COMMUNITY): Payer: Self-pay

## 2018-08-12 ENCOUNTER — Encounter (HOSPITAL_COMMUNITY)
Admission: RE | Admit: 2018-08-12 | Discharge: 2018-08-12 | Disposition: A | Discharge status: Home / Self Care | Payer: Medicare Other | Source: Ambulatory Visit | Attending: Ophthalmology | Admitting: Ophthalmology

## 2018-08-12 ENCOUNTER — Other Ambulatory Visit: Payer: Self-pay

## 2018-08-12 DIAGNOSIS — Z1159 Encounter for screening for other viral diseases: Secondary | ICD-10-CM | POA: Insufficient documentation

## 2018-08-12 DIAGNOSIS — Z01818 Encounter for other preprocedural examination: Secondary | ICD-10-CM | POA: Diagnosis not present

## 2018-08-12 DIAGNOSIS — Z01812 Encounter for preprocedural laboratory examination: Secondary | ICD-10-CM | POA: Diagnosis present

## 2018-08-12 HISTORY — DX: Polyneuropathy, unspecified: G62.9

## 2018-08-12 LAB — CBC
HCT: 40.3 % (ref 39.0–52.0)
Hemoglobin: 14.2 g/dL (ref 13.0–17.0)
MCH: 30.7 pg (ref 26.0–34.0)
MCHC: 35.2 g/dL (ref 30.0–36.0)
MCV: 87.2 fL (ref 80.0–100.0)
Platelets: 356 10*3/uL (ref 150–400)
RBC: 4.62 MIL/uL (ref 4.22–5.81)
RDW: 14.2 % (ref 11.5–15.5)
WBC: 7.5 10*3/uL (ref 4.0–10.5)
nRBC: 0 % (ref 0.0–0.2)

## 2018-08-12 NOTE — Patient Instructions (Signed)
Tristan Cook  08/12/2018     @PREFPERIOPPHARMACY @   Your procedure is scheduled on 08/16/18.  Report to [name] at 7:30 A.M.  Call this number if you have problems the morning of surgery:  [phone]   Remember:  Do not eat or drink after midnight.   Take these medicines the morning of surgery with A SIP OF WATER: Metoprolol and Omeprazole    Do not wear jewelry, make-up or nail polish.  Do not wear lotions, powders, or perfumes, or deodorant.  Do not shave 48 hours prior to surgery.  Men may shave face and neck.  Do not bring valuables to the hospital.  [facility] is not responsible for any belongings or valuables.  Contacts, dentures or bridgework may not be worn into surgery.  Leave your suitcase in the car.  After surgery it may be brought to your room.  For patients admitted to the hospital, discharge time will be determined by your treatment team.  Patients discharged the day of surgery will not be allowed to drive home.   Name and phone number of your driver:   friend Special instructions:  n/a  Please read over the following fact sheets that you were given. Care and Recovery After Surgery     Cataract Surgery Cataract surgery is a procedure to remove a cloudy lens (cataract) in the eye. The lens focuses light inside the eye. When a lens becomes cloudy, your vision is affected. Another lens (intraocular lens or IOL) is usually inserted to replace the cloudy lens and to properly focus light in the eye. Cataract surgery is usually recommended when the cataract is causing problems with daily functioning, such as reading, watching television, or driving. Tell a health care provider about:  Any allergies you have.  All medicines you are taking, including vitamins, herbs, eye drops, creams, and over-the-counter medicines.  Any problems you or family members have had with anesthetic medicines.  Any blood disorders you have.  Any surgeries you have had,  especially eye surgeries that include refractive surgery, such as PRK and LASIK.  Any medical conditions you have.  Whether you are pregnant or may be pregnant. What are the risks? Generally, this is a safe procedure. However, problems may occur, including:  Infection.  Bleeding.  High pressure in the eye (glaucoma).  Detachment of the retina.  Corneal swelling.  Allergic reactions to medicines.  Damage to other structures or organs.  Inflammation of the eye.  Clouding of the part of your eye that holds an IOL in place (after-cataract). This is common and can be treated at a later date with laser surgery.  An IOL moving out of position (rare).  Loss of vision (rare). What happens before the procedure? Staying hydrated Follow instructions from your health care provider about hydration, which may include:  Up to 2 to 6 hours before the procedure - you may continue to drink clear liquids, such as water, clear fruit juice, black coffee, and plain tea. Exact instructions will be given by your health care provider.  Eating and drinking restrictions Follow instructions from your health care provider about eating and drinking, which may include:  8 hours before the procedure - stop eating heavy meals or foods, such as meat, fried foods, or fatty foods.  6-8 hours before the procedure - stop eating light meals or foods, such as toast or cereal.  6-8 hours before the procedure - stop drinking milk or drinks that contain milk.  2-6  hours before the procedure - stop drinking clear liquids. Medicines Ask your health care provider about:  Changing or stopping your regular medicines. This is especially important if you are taking diabetes medicines or blood thinners.  Taking medicines such as aspirin and ibuprofen. These medicines can thin your blood. Do not take these medicines unless your health care provider tells you to take them.  Taking over-the-counter medicines, vitamins,  herbs, and supplements. General instructions  Do not put contact lenses in either eye on the day of your surgery.  Plan to have someone take you home from the hospital or clinic.  If you will be going home right after the procedure, plan to have someone with you for 24 hours.  Ask your health care provider what steps will be taken to help prevent infection. These may include: ? Washing skin with a germ-killing soap. ? Taking antibiotic medicine. What happens during the procedure?      An IV may be inserted into one of your veins.  You will be given one or both of the following: ? A medicine to help you relax (sedative). ? A medicine to numb the area (local anesthetic). This may be numbing eye drops or an injection that is given behind the eye.  A small cut (incision) will be made to the edge of the clear surface that covers the front of the eye (cornea).  A small probe will be inserted into the eye. This device gives off ultrasound waves that soften and break up the cloudy center of the lens. This makes it easier for the cataract to be removed.  The cataract will be removed by suction.  An IOL may be implanted.  Part of the capsule that surrounds the lens will be left in the eye to support the IOL.  Your surgeon may use stitches (sutures) to close the incision. The procedure may vary among health care providers and hospitals. What happens after the procedure?  Your blood pressure, heart rate, breathing rate, and blood oxygen level will be monitored until you leave the hospital or clinic.  You may be given a protective shield to wear over your eye.  You may be given medicines to relieve discomfort and swelling. Some medicines may be in the form of eye drops.  Do not drive for 24 hours if you were given a sedative during your procedure. Summary  Cataract surgery is a procedure to remove a cloudy lens (cataract) in the eye.  This is a safe procedure. However, problems may  occur, including infection, bleeding, glaucoma, inflammation, and loss of vision.  Follow your health care provider's instructions about when to stop eating and drinking and whether to change or stop certain medicines.  After the procedure, you may be given medicines or an eye shield to wear over your eye.  Do not drive for 24 hours if you were given a sedative during your procedure. This information is not intended to replace advice given to you by your health care provider. Make sure you discuss any questions you have with your health care provider. Document Released: 02/16/2011 Document Revised: 08/27/2017 Document Reviewed: 08/27/2017 Elsevier Interactive Patient Education  [employer].

## 2018-08-13 LAB — NOVEL CORONAVIRUS, NAA (HOSP ORDER, SEND-OUT TO REF LAB; TAT 18-24 HRS): SARS-CoV-2, NAA: NOT DETECTED

## 2018-08-15 MED ORDER — LIDOCAINE HCL 3.5 % OP GEL
OPHTHALMIC | Status: AC
  Filled 2018-08-15: qty 1

## 2018-08-15 MED ORDER — LIDOCAINE HCL (PF) 1 % IJ SOLN
INTRAMUSCULAR | Status: AC
  Filled 2018-08-15: qty 2

## 2018-08-15 MED ORDER — PHENYLEPHRINE HCL 2.5 % OP SOLN
OPHTHALMIC | Status: AC
  Filled 2018-08-15: qty 15

## 2018-08-15 MED ORDER — CYCLOPENTOLATE-PHENYLEPHRINE 0.2-1 % OP SOLN
OPHTHALMIC | Status: AC
  Filled 2018-08-15: qty 2

## 2018-08-15 MED ORDER — TETRACAINE HCL 0.5 % OP SOLN
OPHTHALMIC | Status: AC
  Filled 2018-08-15: qty 4

## 2018-08-16 ENCOUNTER — Other Ambulatory Visit: Payer: Self-pay

## 2018-08-16 ENCOUNTER — Encounter (HOSPITAL_COMMUNITY): Payer: Self-pay

## 2018-08-16 ENCOUNTER — Ambulatory Visit (HOSPITAL_COMMUNITY): Payer: Medicare Other | Admitting: Anesthesiology

## 2018-08-16 ENCOUNTER — Ambulatory Visit (HOSPITAL_COMMUNITY)
Admission: RE | Admit: 2018-08-16 | Discharge: 2018-08-16 | Disposition: A | Discharge status: Home / Self Care | Payer: Medicare Other | Attending: Ophthalmology | Admitting: Ophthalmology

## 2018-08-16 ENCOUNTER — Encounter (HOSPITAL_COMMUNITY)
Admission: RE | Disposition: A | Discharge status: Home / Self Care | Payer: Self-pay | Source: Home / Self Care | Attending: Ophthalmology

## 2018-08-16 DIAGNOSIS — F329 Major depressive disorder, single episode, unspecified: Secondary | ICD-10-CM | POA: Diagnosis not present

## 2018-08-16 DIAGNOSIS — E7800 Pure hypercholesterolemia, unspecified: Secondary | ICD-10-CM | POA: Diagnosis not present

## 2018-08-16 DIAGNOSIS — Z79899 Other long term (current) drug therapy: Secondary | ICD-10-CM | POA: Diagnosis not present

## 2018-08-16 DIAGNOSIS — Z8673 Personal history of transient ischemic attack (TIA), and cerebral infarction without residual deficits: Secondary | ICD-10-CM | POA: Diagnosis not present

## 2018-08-16 DIAGNOSIS — I10 Essential (primary) hypertension: Secondary | ICD-10-CM | POA: Diagnosis not present

## 2018-08-16 DIAGNOSIS — K219 Gastro-esophageal reflux disease without esophagitis: Secondary | ICD-10-CM | POA: Diagnosis not present

## 2018-08-16 DIAGNOSIS — G709 Myoneural disorder, unspecified: Secondary | ICD-10-CM | POA: Insufficient documentation

## 2018-08-16 DIAGNOSIS — H259 Unspecified age-related cataract: Secondary | ICD-10-CM | POA: Diagnosis present

## 2018-08-16 DIAGNOSIS — Z87891 Personal history of nicotine dependence: Secondary | ICD-10-CM | POA: Insufficient documentation

## 2018-08-16 HISTORY — PX: CATARACT EXTRACTION W/PHACO: SHX586

## 2018-08-16 SURGERY — PHACOEMULSIFICATION, CATARACT, WITH IOL INSERTION
Anesthesia: Monitor Anesthesia Care | Site: Eye | Laterality: Right

## 2018-08-16 MED ORDER — NEOMYCIN-POLYMYXIN-DEXAMETH 3.5-10000-0.1 OP SUSP
OPHTHALMIC | Status: DC | PRN
  Administered 2018-08-16: 2 [drp] via OPHTHALMIC

## 2018-08-16 MED ORDER — LIDOCAINE HCL (PF) 1 % IJ SOLN
INTRAOCULAR | Status: DC | PRN
  Administered 2018-08-16: .9 mL via OPHTHALMIC

## 2018-08-16 MED ORDER — PROVISC 10 MG/ML IO SOLN
INTRAOCULAR | Status: DC | PRN
  Administered 2018-08-16: 0.85 mL via INTRAOCULAR

## 2018-08-16 MED ORDER — CYCLOPENTOLATE-PHENYLEPHRINE 0.2-1 % OP SOLN
1.0000 [drp] | OPHTHALMIC | Status: AC
  Administered 2018-08-16 (×3): 1 [drp] via OPHTHALMIC

## 2018-08-16 MED ORDER — PHENYLEPHRINE HCL 2.5 % OP SOLN
1.0000 [drp] | OPHTHALMIC | Status: AC
  Administered 2018-08-16 (×3): 1 [drp] via OPHTHALMIC

## 2018-08-16 MED ORDER — BSS IO SOLN
INTRAOCULAR | Status: DC | PRN
  Administered 2018-08-16: 15 mL via INTRAOCULAR

## 2018-08-16 MED ORDER — LIDOCAINE HCL 3.5 % OP GEL
1.0000 "application " | Freq: Once | OPHTHALMIC | Status: AC
  Administered 2018-08-16: 1 via OPHTHALMIC

## 2018-08-16 MED ORDER — NEOMYCIN-POLYMYXIN-DEXAMETH 3.5-10000-0.1 OP SUSP
OPHTHALMIC | Status: AC
  Filled 2018-08-16: qty 5

## 2018-08-16 MED ORDER — EPINEPHRINE PF 1 MG/ML IJ SOLN
INTRAOCULAR | Status: DC | PRN
  Administered 2018-08-16: 500 mL

## 2018-08-16 MED ORDER — SODIUM HYALURONATE 23 MG/ML IO SOLN
INTRAOCULAR | Status: DC | PRN
  Administered 2018-08-16: 0.6 mL via INTRAOCULAR

## 2018-08-16 MED ORDER — EPINEPHRINE PF 1 MG/ML IJ SOLN
INTRAMUSCULAR | Status: AC
  Filled 2018-08-16: qty 2

## 2018-08-16 MED ORDER — POVIDONE-IODINE 5 % OP SOLN
OPHTHALMIC | Status: DC | PRN
  Administered 2018-08-16: 1 via OPHTHALMIC

## 2018-08-16 MED ORDER — TETRACAINE HCL 0.5 % OP SOLN
1.0000 [drp] | OPHTHALMIC | Status: AC
  Administered 2018-08-16 (×3): 1 [drp] via OPHTHALMIC

## 2018-08-16 SURGICAL SUPPLY — 16 items
CLOTH BEACON ORANGE TIMEOUT ST (SAFETY) ×1 IMPLANT
DEVICE MILOOP (MISCELLANEOUS) IMPLANT
EYE SHIELD UNIVERSAL CLEAR (GAUZE/BANDAGES/DRESSINGS) ×1 IMPLANT
GLOVE BIOGEL PI IND STRL 7.0 (GLOVE) IMPLANT
GLOVE BIOGEL PI INDICATOR 7.0 (GLOVE) ×2
LENS ALC ACRYL/TECN (Ophthalmic Related) ×1 IMPLANT
MILOOP DEVICE (MISCELLANEOUS)
NDL HYPO 18GX1.5 BLUNT FILL (NEEDLE) IMPLANT
NEEDLE HYPO 18GX1.5 BLUNT FILL (NEEDLE) ×2 IMPLANT
PAD ARMBOARD 7.5X6 YLW CONV (MISCELLANEOUS) ×1 IMPLANT
RING MALYGIN (MISCELLANEOUS) IMPLANT
SYR TB 1ML LL NO SAFETY (SYRINGE) ×1 IMPLANT
TAPE SURG TRANSPORE 1 IN (GAUZE/BANDAGES/DRESSINGS) IMPLANT
TAPE SURGICAL TRANSPORE 1 IN (GAUZE/BANDAGES/DRESSINGS) ×1
VISCOELASTIC ADDITIONAL (OPHTHALMIC RELATED) ×1 IMPLANT
WATER STERILE IRR 250ML POUR (IV SOLUTION) ×1 IMPLANT

## 2018-08-16 NOTE — H&P (Signed)
The H and P was reviewed and updated. The patient was examined.  No changes were found after exam.  The surgical eye was marked.  

## 2018-08-16 NOTE — Anesthesia Postprocedure Evaluation (Signed)
Anesthesia Post Note  Patient: Tristan Cook  Procedure(s) Performed: CATARACT EXTRACTION PHACO AND INTRAOCULAR LENS PLACEMENT RIGHT EYE (Right Eye)  Patient location during evaluation: Short Stay Anesthesia Type: MAC Level of consciousness: awake and alert and oriented Pain management: pain level controlled Vital Signs Assessment: post-procedure vital signs reviewed and stable Respiratory status: respiratory function stable and spontaneous breathing Cardiovascular status: stable Postop Assessment: no apparent nausea or vomiting Anesthetic complications: no     Last Vitals:  Vitals:   08/16/18 0830 08/16/18 0832  BP:  (!) 142/95  Pulse: 91   Resp: 18   Temp: 36.8 C   SpO2: 98%     Last Pain:  Vitals:   08/16/18 0830  TempSrc: Oral  PainSc: 0-No pain                 Johni Narine A

## 2018-08-16 NOTE — Anesthesia Procedure Notes (Signed)
Procedure Name: MAC Date/Time: 08/16/2018 9:31 AM Performed by: Andree Elk Bernardino Dowell A, CRNA Pre-anesthesia Checklist: Patient identified, Emergency Drugs available, Suction available, Timeout performed and Patient being monitored Patient Re-evaluated:Patient Re-evaluated prior to induction Oxygen Delivery Method: Nasal Cannula

## 2018-08-16 NOTE — Op Note (Signed)
Date of procedure: 08/16/18  Pre-operative diagnosis: Visually significant age-related cataract, Right Eye (H25.11)  Post-operative diagnosis: Visually significant age-related cataract, Right Eye  Procedure: Removal of cataract via phacoemulsification and insertion of intra-ocular lens [employer] PCB00  +17.0D into the capsular bag of the Right Eye  Attending surgeon: Gerda Diss. Nyema Hachey, MD, MA  Anesthesia: MAC, Topical [place]  Complications: None  Estimated Blood Loss: <70m (minimal)  Specimens: None  Implants: As above  Indications:  Visually significant age-related cataract, Right Eye  Procedure:  The patient was seen and identified in the pre-operative area. The operative eye was identified and dilated.  The operative eye was marked.  Topical anesthesia was administered to the operative eye.     The patient was then to the operative suite and placed in the supine position.  A timeout was performed confirming the patient, procedure to be performed, and all other relevant information.   The patient's face was prepped and draped in the usual fashion for intra-ocular surgery.  A lid speculum was placed into the operative eye and the surgical microscope moved into place and focused.  A superotemporal paracentesis was created using a 20 gauge paracentesis blade.  Shugarcaine was injected into the anterior chamber.  Viscoelastic was injected into the anterior chamber.  A temporal clear-corneal main wound incision was created using a 2.448mmicrokeratome.  A continuous curvilinear capsulorrhexis was initiated using an irrigating cystitome and completed using capsulorrhexis forceps.  Hydrodissection and hydrodeliniation were performed.  Viscoelastic was injected into the anterior chamber.  A phacoemulsification handpiece and a chopper as a second instrument were used to remove the nucleus and epinucleus. The irrigation/aspiration handpiece was used to remove any remaining cortical  material.   The capsular bag was reinflated with viscoelastic, checked, and found to be intact.  The intraocular lens was inserted into the capsular bag and dialed into place using a Kuglen hook.  The irrigation/aspiration handpiece was used to remove any remaining viscoelastic.  The clear corneal wound and paracentesis wounds were then hydrated and checked with Weck-Cels to be watertight.  The lid-speculum and drape was removed, and the patient's face was cleaned with a wet and dry 4x4.  Maxitrol was instilled in the eye before a clear shield was taped over the eye. The patient was taken to the post-operative care unit in good condition, having tolerated the procedure well.  Post-Op Instructions: [facility] and will receive all other orders and instructions.

## 2018-08-16 NOTE — Transfer of Care (Signed)
Immediate Anesthesia Transfer of Care Note  Patient: Tristan Cook  Procedure(s) Performed: CATARACT EXTRACTION PHACO AND INTRAOCULAR LENS PLACEMENT RIGHT EYE (Right Eye)  Patient Location: Short Stay  Anesthesia Type:MAC  Level of Consciousness: awake, alert , oriented and patient cooperative  Airway & Oxygen Therapy: Patient Spontanous Breathing  Post-op Assessment: Report given to RN and Post -op Vital signs reviewed and stable  Post vital signs: Reviewed and stable  Last Vitals:  Vitals Value Taken Time  BP    Temp    Pulse    Resp    SpO2      Last Pain:  Vitals:   08/16/18 0830  TempSrc: Oral  PainSc: 0-No pain         Complications: No apparent anesthesia complications

## 2018-08-16 NOTE — Anesthesia Preprocedure Evaluation (Signed)
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       Plan Discussed with: Anesthesiologist  Anesthesia Plan Comments:         Anesthesia Quick Evaluation

## 2018-08-16 NOTE — Discharge Instructions (Signed)
Please discharge patient when stable, will follow up today with Dr. Taven Strite at the [facility] office immediately following discharge.  Leave shield in place until visit.  All paperwork with discharge instructions will be given at the office. ° °

## 2018-08-19 ENCOUNTER — Encounter (HOSPITAL_COMMUNITY): Payer: Self-pay | Admitting: Ophthalmology

## 2018-08-23 ENCOUNTER — Other Ambulatory Visit (HOSPITAL_COMMUNITY): Payer: Medicaid Other

## 2018-08-26 ENCOUNTER — Other Ambulatory Visit: Payer: Self-pay

## 2018-08-26 ENCOUNTER — Other Ambulatory Visit (HOSPITAL_COMMUNITY): Payer: Self-pay

## 2018-08-26 ENCOUNTER — Encounter (HOSPITAL_COMMUNITY)
Admit: 2018-08-26 | Discharge: 2018-08-26 | Disposition: A | Discharge status: Home / Self Care | Payer: Medicare Other | Attending: Ophthalmology | Admitting: Ophthalmology

## 2018-08-27 ENCOUNTER — Encounter: Payer: Self-pay | Admitting: Emergency Medicine

## 2018-08-27 ENCOUNTER — Inpatient Hospital Stay
Admission: AD | Admit: 2018-08-27 | Discharge: 2018-09-05 | DRG: 885 | Disposition: A | Discharge status: Other Acute Inpatient Hospital | Payer: Medicare Other | Attending: Psychiatry | Admitting: Psychiatry

## 2018-08-27 ENCOUNTER — Emergency Department (EMERGENCY_DEPARTMENT_HOSPITAL)
Admission: EM | Admit: 2018-08-27 | Discharge: 2018-08-27 | Disposition: A | Discharge status: Other Acute Inpatient Hospital | Payer: Medicare Other | Source: Home / Self Care | Attending: Emergency Medicine | Admitting: Emergency Medicine

## 2018-08-27 DIAGNOSIS — F1020 Alcohol dependence, uncomplicated: Secondary | ICD-10-CM | POA: Diagnosis present

## 2018-08-27 DIAGNOSIS — E538 Deficiency of other specified B group vitamins: Secondary | ICD-10-CM

## 2018-08-27 DIAGNOSIS — Z1159 Encounter for screening for other viral diseases: Secondary | ICD-10-CM

## 2018-08-27 DIAGNOSIS — Z79899 Other long term (current) drug therapy: Secondary | ICD-10-CM | POA: Diagnosis not present

## 2018-08-27 DIAGNOSIS — F333 Major depressive disorder, recurrent, severe with psychotic symptoms: Secondary | ICD-10-CM | POA: Diagnosis present

## 2018-08-27 DIAGNOSIS — M549 Dorsalgia, unspecified: Secondary | ICD-10-CM | POA: Diagnosis present

## 2018-08-27 DIAGNOSIS — K219 Gastro-esophageal reflux disease without esophagitis: Secondary | ICD-10-CM | POA: Diagnosis present

## 2018-08-27 DIAGNOSIS — E7800 Pure hypercholesterolemia, unspecified: Secondary | ICD-10-CM | POA: Diagnosis present

## 2018-08-27 DIAGNOSIS — G4700 Insomnia, unspecified: Secondary | ICD-10-CM | POA: Diagnosis present

## 2018-08-27 DIAGNOSIS — Z96612 Presence of left artificial shoulder joint: Secondary | ICD-10-CM | POA: Diagnosis present

## 2018-08-27 DIAGNOSIS — F329 Major depressive disorder, single episode, unspecified: Secondary | ICD-10-CM | POA: Insufficient documentation

## 2018-08-27 DIAGNOSIS — Z7982 Long term (current) use of aspirin: Secondary | ICD-10-CM | POA: Diagnosis not present

## 2018-08-27 DIAGNOSIS — Z791 Long term (current) use of non-steroidal anti-inflammatories (NSAID): Secondary | ICD-10-CM | POA: Diagnosis not present

## 2018-08-27 DIAGNOSIS — Z87891 Personal history of nicotine dependence: Secondary | ICD-10-CM

## 2018-08-27 DIAGNOSIS — Z823 Family history of stroke: Secondary | ICD-10-CM | POA: Diagnosis not present

## 2018-08-27 DIAGNOSIS — F10929 Alcohol use, unspecified with intoxication, unspecified: Secondary | ICD-10-CM | POA: Insufficient documentation

## 2018-08-27 DIAGNOSIS — Z7952 Long term (current) use of systemic steroids: Secondary | ICD-10-CM

## 2018-08-27 DIAGNOSIS — F419 Anxiety disorder, unspecified: Secondary | ICD-10-CM | POA: Insufficient documentation

## 2018-08-27 DIAGNOSIS — I10 Essential (primary) hypertension: Secondary | ICD-10-CM | POA: Insufficient documentation

## 2018-08-27 DIAGNOSIS — Z03818 Encounter for observation for suspected exposure to other biological agents ruled out: Secondary | ICD-10-CM | POA: Insufficient documentation

## 2018-08-27 DIAGNOSIS — G621 Alcoholic polyneuropathy: Secondary | ICD-10-CM | POA: Diagnosis present

## 2018-08-27 DIAGNOSIS — F1010 Alcohol abuse, uncomplicated: Secondary | ICD-10-CM | POA: Diagnosis present

## 2018-08-27 DIAGNOSIS — G63 Polyneuropathy in diseases classified elsewhere: Secondary | ICD-10-CM | POA: Diagnosis present

## 2018-08-27 DIAGNOSIS — F17220 Nicotine dependence, chewing tobacco, uncomplicated: Secondary | ICD-10-CM | POA: Insufficient documentation

## 2018-08-27 DIAGNOSIS — Z8673 Personal history of transient ischemic attack (TIA), and cerebral infarction without residual deficits: Secondary | ICD-10-CM | POA: Diagnosis not present

## 2018-08-27 DIAGNOSIS — F10120 Alcohol abuse with intoxication, uncomplicated: Secondary | ICD-10-CM | POA: Insufficient documentation

## 2018-08-27 DIAGNOSIS — R440 Auditory hallucinations: Secondary | ICD-10-CM

## 2018-08-27 DIAGNOSIS — G629 Polyneuropathy, unspecified: Secondary | ICD-10-CM | POA: Diagnosis present

## 2018-08-27 DIAGNOSIS — R443 Hallucinations, unspecified: Secondary | ICD-10-CM | POA: Diagnosis present

## 2018-08-27 DIAGNOSIS — K5900 Constipation, unspecified: Secondary | ICD-10-CM | POA: Diagnosis present

## 2018-08-27 DIAGNOSIS — F10920 Alcohol use, unspecified with intoxication, uncomplicated: Secondary | ICD-10-CM

## 2018-08-27 LAB — CBC
HCT: 37.7 % — ABNORMAL LOW (ref 39.0–52.0)
Hemoglobin: 13.8 g/dL (ref 13.0–17.0)
MCH: 30.4 pg (ref 26.0–34.0)
MCHC: 36.6 g/dL — ABNORMAL HIGH (ref 30.0–36.0)
MCV: 83 fL (ref 80.0–100.0)
Platelets: 214 10*3/uL (ref 150–400)
RBC: 4.54 MIL/uL (ref 4.22–5.81)
RDW: 13.8 % (ref 11.5–15.5)
WBC: 4.4 10*3/uL (ref 4.0–10.5)
nRBC: 0 % (ref 0.0–0.2)

## 2018-08-27 LAB — TSH: TSH: 1.112 u[IU]/mL (ref 0.350–4.500)

## 2018-08-27 LAB — ETHANOL: Alcohol, Ethyl (B): 208 mg/dL — ABNORMAL HIGH (ref <10)

## 2018-08-27 LAB — LIPID PANEL
Cholesterol: 438 mg/dL — ABNORMAL HIGH (ref 0–200)
HDL: 47 mg/dL (ref >40)
LDL Cholesterol: UNDETERMINED mg/dL (ref 0–99)
Total CHOL/HDL Ratio: 9.3 RATIO
Triglycerides: 855 mg/dL — ABNORMAL HIGH (ref <150)
VLDL: UNDETERMINED mg/dL (ref 0–40)

## 2018-08-27 LAB — COMPREHENSIVE METABOLIC PANEL
ALT: 30 U/L (ref 0–44)
AST: 78 U/L — ABNORMAL HIGH (ref 15–41)
Albumin: 3.5 g/dL (ref 3.5–5.0)
Alkaline Phosphatase: 95 U/L (ref 38–126)
Anion gap: 13 (ref 5–15)
BUN: <5 mg/dL — ABNORMAL LOW (ref 6–20)
CO2: 20 mmol/L — ABNORMAL LOW (ref 22–32)
Calcium: 8.7 mg/dL — ABNORMAL LOW (ref 8.9–10.3)
Chloride: 98 mmol/L (ref 98–111)
Creatinine, Ser: 0.88 mg/dL (ref 0.61–1.24)
GFR calc Af Amer: >60 mL/min (ref >60)
GFR calc non Af Amer: >60 mL/min (ref >60)
Glucose, Bld: 98 mg/dL (ref 70–99)
Potassium: 3.3 mmol/L — ABNORMAL LOW (ref 3.5–5.1)
Sodium: 131 mmol/L — ABNORMAL LOW (ref 135–145)
Total Bilirubin: 0.8 mg/dL (ref 0.3–1.2)
Total Protein: 7.8 g/dL (ref 6.5–8.1)

## 2018-08-27 LAB — URINE DRUG SCREEN, QUALITATIVE (ARMC ONLY)
Amphetamines, Ur Screen: NOT DETECTED
Barbiturates, Ur Screen: NOT DETECTED
Benzodiazepine, Ur Scrn: NOT DETECTED
Cannabinoid 50 Ng, Ur ~~LOC~~: NOT DETECTED
Cocaine Metabolite,Ur ~~LOC~~: NOT DETECTED
MDMA (Ecstasy)Ur Screen: NOT DETECTED
Methadone Scn, Ur: NOT DETECTED
Opiate, Ur Screen: NOT DETECTED
Phencyclidine (PCP) Ur S: NOT DETECTED
Tricyclic, Ur Screen: NOT DETECTED

## 2018-08-27 LAB — SALICYLATE LEVEL: Salicylate Lvl: <7 mg/dL (ref 2.8–30.0)

## 2018-08-27 LAB — ACETAMINOPHEN LEVEL: Acetaminophen (Tylenol), Serum: <10 ug/mL — ABNORMAL LOW (ref 10–30)

## 2018-08-27 LAB — SARS CORONAVIRUS 2 BY RT PCR (HOSPITAL ORDER, PERFORMED IN ~~LOC~~ HOSPITAL LAB): SARS Coronavirus 2: NEGATIVE

## 2018-08-27 LAB — HEMOGLOBIN A1C
Hgb A1c MFr Bld: 5.4 % (ref 4.8–5.6)
Mean Plasma Glucose: 108.28 mg/dL

## 2018-08-27 LAB — LDL CHOLESTEROL, DIRECT: Direct LDL: 94.9 mg/dL (ref 0–99)

## 2018-08-27 MED ORDER — MELOXICAM 7.5 MG PO TABS
15.0000 mg | ORAL_TABLET | Freq: Every day | ORAL | Status: DC
  Administered 2018-08-27: 15:00:00 15 mg via ORAL
  Filled 2018-08-27: qty 2

## 2018-08-27 MED ORDER — FOLIC ACID 1 MG PO TABS
500.0000 ug | ORAL_TABLET | Freq: Every day | ORAL | Status: DC
  Administered 2018-08-27: 16:00:00 0.5 mg via ORAL
  Filled 2018-08-27: qty 1
  Filled 2018-08-27: qty 0.5

## 2018-08-27 MED ORDER — ASPIRIN EC 81 MG PO TBEC
81.0000 mg | DELAYED_RELEASE_TABLET | Freq: Every day | ORAL | Status: DC
  Administered 2018-08-27: 16:00:00 81 mg via ORAL
  Filled 2018-08-27: qty 1

## 2018-08-27 MED ORDER — THIAMINE HCL 100 MG/ML IJ SOLN: 100.0000 mg | Freq: Every day | INTRAMUSCULAR | Status: DC

## 2018-08-27 MED ORDER — LORAZEPAM 2 MG PO TABS: 0.0000 mg | ORAL_TABLET | Freq: Two times a day (BID) | ORAL | Status: DC

## 2018-08-27 MED ORDER — MOXIFLOXACIN HCL 0.5 % OP SOLN
1.0000 [drp] | Freq: Three times a day (TID) | OPHTHALMIC | Status: DC
  Filled 2018-08-27 (×2): qty 3

## 2018-08-27 MED ORDER — METOPROLOL TARTRATE 25 MG PO TABS
25.0000 mg | ORAL_TABLET | Freq: Two times a day (BID) | ORAL | Status: DC
  Administered 2018-08-28 – 2018-09-05 (×17): 25 mg via ORAL
  Filled 2018-08-27 (×17): qty 1

## 2018-08-27 MED ORDER — PREDNISOLONE ACETATE 1 % OP SUSP
1.0000 [drp] | Freq: Four times a day (QID) | OPHTHALMIC | Status: DC
  Filled 2018-08-27: qty 1

## 2018-08-27 MED ORDER — PREDNISOLONE ACETATE 1 % OP SUSP
1.0000 [drp] | Freq: Four times a day (QID) | OPHTHALMIC | Status: DC
  Administered 2018-08-27 (×2): 1 [drp] via OPHTHALMIC
  Filled 2018-08-27: qty 5

## 2018-08-27 MED ORDER — ADULT MULTIVITAMIN W/MINERALS CH
1.0000 | ORAL_TABLET | Freq: Every day | ORAL | Status: DC
  Administered 2018-08-27: 15:00:00 1 via ORAL
  Filled 2018-08-27: qty 1

## 2018-08-27 MED ORDER — LORAZEPAM 1 MG PO TABS: 1.0000 mg | ORAL_TABLET | ORAL | Status: DC | PRN

## 2018-08-27 MED ORDER — LORAZEPAM 2 MG/ML IJ SOLN: 0.0000 mg | Freq: Four times a day (QID) | INTRAMUSCULAR | Status: DC

## 2018-08-27 MED ORDER — MAGNESIUM HYDROXIDE 400 MG/5ML PO SUSP
30.0000 mL | Freq: Every day | ORAL | Status: DC | PRN
  Administered 2018-09-01 – 2018-09-04 (×2): 30 mL via ORAL
  Filled 2018-08-27 (×2): qty 30

## 2018-08-27 MED ORDER — VITAMIN B-1 100 MG PO TABS
100.0000 mg | ORAL_TABLET | Freq: Every day | ORAL | Status: DC
  Administered 2018-08-27: 100 mg via ORAL
  Filled 2018-08-27: qty 1

## 2018-08-27 MED ORDER — ACETAMINOPHEN 325 MG PO TABS
650.0000 mg | ORAL_TABLET | Freq: Four times a day (QID) | ORAL | Status: DC | PRN
  Administered 2018-08-30 – 2018-09-01 (×4): 650 mg via ORAL
  Filled 2018-08-27 (×4): qty 2

## 2018-08-27 MED ORDER — FLUOXETINE HCL 20 MG PO CAPS
20.0000 mg | ORAL_CAPSULE | Freq: Every day | ORAL | Status: DC
  Administered 2018-08-28 – 2018-09-05 (×9): 20 mg via ORAL
  Filled 2018-08-27 (×9): qty 1

## 2018-08-27 MED ORDER — CALCIUM CARBONATE-VITAMIN D 500-200 MG-UNIT PO TABS
1.0000 | ORAL_TABLET | Freq: Every day | ORAL | Status: DC
  Administered 2018-08-28 – 2018-09-05 (×9): 1 via ORAL
  Filled 2018-08-27 (×10): qty 1

## 2018-08-27 MED ORDER — ADULT MULTIVITAMIN W/MINERALS CH
1.0000 | ORAL_TABLET | Freq: Every day | ORAL | Status: DC
  Administered 2018-08-28 – 2018-09-05 (×9): 1 via ORAL
  Filled 2018-08-27 (×9): qty 1

## 2018-08-27 MED ORDER — LORAZEPAM 2 MG PO TABS
0.0000 mg | ORAL_TABLET | Freq: Four times a day (QID) | ORAL | Status: DC
  Administered 2018-08-27: 1 mg via ORAL
  Administered 2018-08-27: 09:00:00 2 mg via ORAL
  Filled 2018-08-27 (×2): qty 1

## 2018-08-27 MED ORDER — VITAMIN B-1 100 MG PO TABS
100.0000 mg | ORAL_TABLET | Freq: Every day | ORAL | Status: DC
  Administered 2018-08-28 – 2018-09-05 (×9): 100 mg via ORAL
  Filled 2018-08-27 (×9): qty 1

## 2018-08-27 MED ORDER — QUETIAPINE FUMARATE 25 MG PO TABS
50.0000 mg | ORAL_TABLET | Freq: Every day | ORAL | Status: DC
  Administered 2018-08-28: 50 mg via ORAL
  Filled 2018-08-27: qty 2

## 2018-08-27 MED ORDER — LORAZEPAM 2 MG PO TABS
0.0000 mg | ORAL_TABLET | Freq: Four times a day (QID) | ORAL | Status: DC
  Administered 2018-08-28 – 2018-08-29 (×4): 2 mg via ORAL
  Filled 2018-08-27 (×5): qty 1

## 2018-08-27 MED ORDER — ASPIRIN EC 81 MG PO TBEC
81.0000 mg | DELAYED_RELEASE_TABLET | Freq: Every day | ORAL | Status: DC
  Administered 2018-08-28 – 2018-09-05 (×9): 81 mg via ORAL
  Filled 2018-08-27 (×9): qty 1

## 2018-08-27 MED ORDER — CALCIUM CARBONATE-VITAMIN D 500-200 MG-UNIT PO TABS
1.0000 | ORAL_TABLET | Freq: Every day | ORAL | Status: DC
  Filled 2018-08-27: qty 1

## 2018-08-27 MED ORDER — OLANZAPINE 5 MG PO TBDP: 10.0000 mg | ORAL_TABLET | Freq: Three times a day (TID) | ORAL | Status: DC | PRN

## 2018-08-27 MED ORDER — LORAZEPAM 2 MG/ML IJ SOLN: 0.0000 mg | Freq: Two times a day (BID) | INTRAMUSCULAR | Status: AC

## 2018-08-27 MED ORDER — CHOLECALCIFEROL 10 MCG (400 UNIT) PO TABS
400.0000 [IU] | ORAL_TABLET | Freq: Every day | ORAL | Status: DC
  Administered 2018-08-28 – 2018-09-05 (×9): 400 [IU] via ORAL
  Filled 2018-08-27 (×10): qty 1

## 2018-08-27 MED ORDER — QUETIAPINE FUMARATE 25 MG PO TABS
50.0000 mg | ORAL_TABLET | Freq: Once | ORAL | Status: AC
  Administered 2018-08-27: 16:00:00 50 mg via ORAL
  Filled 2018-08-27: qty 2

## 2018-08-27 MED ORDER — MOXIFLOXACIN HCL 0.5 % OP SOLN: 1.0000 [drp] | Freq: Three times a day (TID) | OPHTHALMIC | Status: DC

## 2018-08-27 MED ORDER — VITAMIN B-12 1000 MCG PO TABS
5000.0000 ug | ORAL_TABLET | Freq: Every day | ORAL | Status: DC
  Administered 2018-08-27: 15:00:00 5000 ug via ORAL
  Filled 2018-08-27: qty 5

## 2018-08-27 MED ORDER — FLUOXETINE HCL 20 MG PO CAPS
20.0000 mg | ORAL_CAPSULE | Freq: Every day | ORAL | Status: DC
  Administered 2018-08-27: 15:00:00 20 mg via ORAL
  Filled 2018-08-27: qty 1

## 2018-08-27 MED ORDER — QUETIAPINE FUMARATE 25 MG PO TABS
50.0000 mg | ORAL_TABLET | Freq: Every day | ORAL | Status: DC
  Administered 2018-08-27: 22:00:00 50 mg via ORAL
  Filled 2018-08-27: qty 2

## 2018-08-27 MED ORDER — METOPROLOL TARTRATE 25 MG PO TABS
25.0000 mg | ORAL_TABLET | Freq: Two times a day (BID) | ORAL | Status: DC
  Administered 2018-08-27 (×2): 25 mg via ORAL
  Filled 2018-08-27 (×2): qty 1

## 2018-08-27 MED ORDER — ZIPRASIDONE MESYLATE 20 MG IM SOLR: 20.0000 mg | INTRAMUSCULAR | Status: DC | PRN

## 2018-08-27 MED ORDER — VITAMIN B-12 1000 MCG PO TABS
5000.0000 ug | ORAL_TABLET | Freq: Every day | ORAL | Status: DC
  Administered 2018-08-28 – 2018-09-05 (×9): 5000 ug via ORAL
  Filled 2018-08-27 (×9): qty 5

## 2018-08-27 MED ORDER — CHOLECALCIFEROL 10 MCG (400 UNIT) PO TABS
400.0000 [IU] | ORAL_TABLET | Freq: Every day | ORAL | Status: DC
  Administered 2018-08-27: 400 [IU] via ORAL
  Filled 2018-08-27: qty 1

## 2018-08-27 MED ORDER — PREDNISOLONE ACETATE 1 % OP SUSP
1.0000 [drp] | Freq: Four times a day (QID) | OPHTHALMIC | Status: DC
  Administered 2018-08-28 – 2018-09-05 (×31): 1 [drp] via OPHTHALMIC
  Filled 2018-08-27 (×2): qty 1

## 2018-08-27 MED ORDER — LORAZEPAM 2 MG PO TABS
0.0000 mg | ORAL_TABLET | Freq: Two times a day (BID) | ORAL | Status: AC
  Administered 2018-08-29 – 2018-08-31 (×3): 2 mg via ORAL
  Filled 2018-08-27 (×2): qty 1

## 2018-08-27 MED ORDER — FOLIC ACID 1 MG PO TABS
500.0000 ug | ORAL_TABLET | Freq: Every day | ORAL | Status: DC
  Administered 2018-08-28 – 2018-09-05 (×9): 0.5 mg via ORAL
  Filled 2018-08-27 (×9): qty 1

## 2018-08-27 MED ORDER — LORAZEPAM 2 MG/ML IJ SOLN: 0.0000 mg | Freq: Two times a day (BID) | INTRAMUSCULAR | Status: DC

## 2018-08-27 MED ORDER — MELOXICAM 7.5 MG PO TABS
15.0000 mg | ORAL_TABLET | Freq: Every day | ORAL | Status: DC
  Administered 2018-08-28 – 2018-09-05 (×9): 15 mg via ORAL
  Filled 2018-08-27 (×9): qty 2

## 2018-08-27 MED ORDER — ALUM & MAG HYDROXIDE-SIMETH 200-200-20 MG/5ML PO SUSP: 30.0000 mL | ORAL | Status: DC | PRN

## 2018-08-27 NOTE — ED Notes (Signed)
Mr. Jaeceon has been referred for Substance Abuse treatment to [facility] [place], and [facility].

## 2018-08-27 NOTE — ED Provider Notes (Signed)
[facility] Emergency Department Provider Note _________   First MD Initiated Contact with Patient 08/27/18 0031     (approximate)  I have reviewed the triage vital signs and the nursing notes.   HISTORY  Chief Complaint Hallucinations   HPI Tristan Cook is a 58 y.o. male with below list of previous medical conditions presents to the emergency department with a 1 month history of auditory hallucinations that are threatening in nature.  Patient states that "the voices tell me to hurt myself".  Patient denies any suicidal or homicidal ideation.        Past Medical History:  Diagnosis Date  . Abdominal pain   . Adenomatous colon polyp 2008  . Arthritis   . Back pain   . Bilateral lumbar radiculopathy   . Blurred vision, bilateral   . Chronic back pain   . Constipation   . Depression   . GERD (gastroesophageal reflux disease)   . Gout   . Hemorrhoids   . High cholesterol   . Hypertension   . Neck pain   . Neuropathy   . Neuropathy of both feet   . Numbness    rectal  . Numbness of legs   . Stroke ([place]) 2019  . Substance abuse ([place])    h/o excessive alcohol use; pt says he limits use to one to 2 beers daily he currently    Patient Active Problem List   Diagnosis Date Noted  . Alcohol intoxication ([place]) 08/27/2018  . TIA (transient ischemic attack) 05/29/2018  . Syncope 05/29/2018  . Alcohol abuse 05/29/2018  . Fall 05/29/2018  . Dislocation of left shoulder joint 05/29/2018  . Closed fracture of left proximal humerus 05/29/2018  . Abnormal LFTs 05/29/2018  . Hypokalemia 05/29/2018  . Constipation 10/17/2017  . Hoarseness 10/17/2017  . Weakness 04/11/2017  . Weakness of both legs 04/10/2017  . Vitamin B deficiency 01/22/2017  . 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 Vitamin B12 deficiency neuropathy ([place]) 01/17/2017  . History of colonic polyps  06/14/2016  . Elevated blood pressure reading 06/14/2016  . Hemorrhoids 09/20/2015  . Depression 08/03/2015  . Back pain with radiation 06/15/2015  . Insomnia 06/03/2015  . Hyperlipidemia LDL goal <130 04/25/2015  . 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 Fatigue 03/29/2015  . Allergic rhinitis 02/10/2015  . Esophageal stricture 10/22/2014  . Overweight (BMI 25.0-29.9) 10/06/2013  . GERD (gastroesophageal reflux disease) 04/23/2012  . Hemorrhoids, internal 11/02/2011  . Essential hypertension 07/25/2007  . Neck pain on left side 07/25/2007    Past Surgical History:  Procedure Laterality Date  . CATARACT EXTRACTION W/PHACO Right 08/16/2018   Procedure: CATARACT EXTRACTION PHACO AND INTRAOCULAR LENS PLACEMENT RIGHT EYE;  Surgeon: Baruch Goldmann, MD;  Location: [facility];  Service: Ophthalmology;  Laterality: Right;  CDE: 4.18  . COLONOSCOPY  10/09/2006   3 mm pedunculated sigmoid colon polyp removed/8-mm sessile hepatic flexure polyp (tubular villous adenoma) removed/6-mm  descending colon polyp removed/small internal hemorrhoids  . COLONOSCOPY  02/28/2011   TDS:KAJGOT, multiple in the rectum/Internal hemorrhoids, MODERATE-CAUSING RECTAL BLEEDING  . COLONOSCOPY N/A 07/05/2016   Procedure: COLONOSCOPY;  Surgeon: [name], MD;  Location: [facility];  Service: Endoscopy;  Laterality: N/A;  11:15am  . 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 Mild erosive gastrtitis most likely due to indomethacin   . FINGER SURGERY Right  4th and 5th digit  . FLEXIBLE SIGMOIDOSCOPY  01/02/2012   SLF: 1. the colonic mucosa appeared normal in the sigmoid colon 2. Large internal hemorrhoids: cause for rectal bleeding/pain . s/p banding X 3   . HAND SURGERY    . REVERSE SHOULDER ARTHROPLASTY Left 05/29/2018   Procedure: REVERSE SHOULDER ARTHROPLASTY;  Surgeon: Hiram Gash, MD;  Location: [facility];  Service: Orthopedics;  Laterality: Left;  . SAVORY  DILATION N/A 07/21/2014   Procedure: SAVORY DILATION;  Surgeon: [name], MD;  Location: [facility];  Service: Endoscopy;  Laterality: N/A;    Prior to Admission medications   Medication Sig Start Date End Date Taking? Authorizing Provider  aspirin EC 81 MG tablet Take 81 mg by mouth daily.     [provider]  calcium-vitamin D (CALCIUM 500/D) 500-200 MG-UNIT tablet Take 1 tablet by mouth daily with breakfast.    [provider]  cholecalciferol (VITAMIN D-400) 10 MCG (400 UNIT) TABS tablet Take 400 Units by mouth daily.    [provider]  folic acid (FOLVITE) 932 MCG tablet Take 400 mcg by mouth daily.    [provider]  meloxicam (MOBIC) 15 MG tablet Take 15 mg by mouth daily. 08/05/18   [provider]  metoprolol tartrate (LOPRESSOR) 50 MG tablet Take 25 mg by mouth 2 (two) times daily.    [provider]  Multiple Vitamin (MULTIVITAMIN WITH MINERALS) TABS tablet Take 1 tablet by mouth daily. 04/12/17   Johnson, Clanford L, MD  vitamin B-12 (CYANOCOBALAMIN) 1000 MCG tablet Take 5,000 mcg by mouth daily.     [provider]    Allergies Hydrocodone and Benadryl [diphenhydramine hcl (sleep)]  Family History  Problem Relation Age of Onset  . Hypertension Mother   . Hypertension Father   . Stroke Father   . Hypertension Sister   . Hypertension Brother   . Cancer Brother   . Cancer Brother        throat cancer  . Hypertension Brother   . Diabetes Maternal Grandmother   . Colon cancer Neg Hx     Social History Social History   Tobacco Use  . Smoking status: Former Smoker    Packs/day: 1.50    Years: 33.00    Pack years: 49.50    Types: Cigarettes    Quit date: 08/16/2004    Years since quitting: 14.0  . Smokeless tobacco: Current User    Types: Snuff  . Tobacco comment: Dips every once in a while  Substance Use Topics  . Alcohol use: Yes    Alcohol/week: 3.0 standard drinks    Types: 3 Cans of beer  per week    Comment: drinks 6 pack of beer on weekends  . Drug use: No    Review of Systems Constitutional: No fever/chills Eyes: No visual changes. ENT: No sore throat. Cardiovascular: Denies chest pain. Respiratory: Denies shortness of breath. Gastrointestinal: No abdominal pain.  No nausea, no vomiting.  No diarrhea.  No constipation. Genitourinary: Negative for dysuria. Musculoskeletal: Negative for neck pain.  Negative for back pain. Integumentary: Negative for rash. Neurological: Negative for headaches, focal weakness or numbness. Psychiatric:  Positive for auditory hallucinations   ____________________________________________   PHYSICAL EXAM:  VITAL SIGNS: ED Triage Vitals [08/27/18 0034]  Enc Vitals Group     BP (!) 142/93     Pulse Rate (!) 103     Resp 18     Temp 98.6 F (37 C)  Temp Source Oral     SpO2 100 %     Weight      Height      Head Circumference      Peak Flow      Pain Score      Pain Loc      Pain Edu?      Excl. in [place]?     Constitutional: Alert and oriented. Well appearing and in no acute distress. Eyes: Conjunctivae are normal.  Mouth/Throat: Mucous membranes are moist. Oropharynx non-erythematous. Neck: No stridor.   Cardiovascular: Normal rate, regular rhythm. Good peripheral circulation. Grossly normal heart sounds. Respiratory: Normal respiratory effort.  No retractions. No audible wheezing. Gastrointestinal: Soft and nontender. No distention.  Musculoskeletal: No lower extremity tenderness nor edema. No gross deformities of extremities. Neurologic:  Normal speech and language. No gross focal neurologic deficits are appreciated.  Skin:  Skin is warm, dry and intact. No rash noted. Psychiatric: Mood and affect are normal. Speech and behavior are normal.  ____________________________________________   LABS (all labs ordered are listed, but only abnormal results are displayed)  Labs Reviewed  COMPREHENSIVE METABOLIC PANEL -  Abnormal; Notable for the following components:      Result Value   Sodium 131 (*)    Potassium 3.3 (*)    CO2 20 (*)    BUN <5 (*)    Calcium 8.7 (*)    AST 78 (*)    All other components within normal limits  ETHANOL - Abnormal; Notable for the following components:   Alcohol, Ethyl (B) 208 (*)    All other components within normal limits  ACETAMINOPHEN LEVEL - Abnormal; Notable for the following components:   Acetaminophen (Tylenol), Serum <10 (*)    All other components within normal limits  CBC - Abnormal; Notable for the following components:   HCT 37.7 (*)    MCHC 36.6 (*)    All other components within normal limits  SALICYLATE LEVEL  URINE DRUG SCREEN, QUALITATIVE ([facility] ONLY)    Procedures   ____________________________________________   INITIAL IMPRESSION / MDM / ASSESSMENT AND PLAN / ED COURSE  As part of my medical decision making, I reviewed the following data within the electronic MEDICAL RECORD NUMBER  58 year old male presenting with above-stated history and physical exam secondary to auditory hallucinations.  Patient evaluated by psychiatry staff and requested alcohol detox at that time.  Awaiting psychiatry consultation and disposition.  *Tristan Cook was evaluated in Emergency Department on 08/27/2018 for the symptoms described in the history of present illness. He was evaluated in the context of the global COVID-19 pandemic, which necessitated consideration that the patient might be at risk for infection with the SARS-CoV-2 virus that causes COVID-19. Institutional protocols and algorithms that pertain to the evaluation of patients at risk for COVID-19 are in a state of rapid change based on information released by regulatory bodies including the CDC and federal and state organizations. These policies and algorithms were followed during the patient's care in the ED.  Some ED evaluations and interventions may be delayed as a result of limited staffing during the  pandemic.*    ____________________________________________  FINAL CLINICAL IMPRESSION(S) / ED DIAGNOSES  Final diagnoses:  Alcoholic intoxication without complication (HCC)  Auditory hallucinations     MEDICATIONS GIVEN DURING THIS VISIT:  Medications - No data to display   ED Discharge Orders    None       Note:  This document was prepared using Dragon voice  recognition software and may include unintentional dictation errors.   Gregor Hams, MD 08/27/18 [phone]

## 2018-08-27 NOTE — ED Notes (Signed)
Walked covid swab to lab

## 2018-08-27 NOTE — ED Notes (Signed)
Hourly rounding reveals patient in room. No complaints, stable, in no acute distress. Q15 minute rounds and monitoring via Rover and Officer to continue.   

## 2018-08-27 NOTE — ED Notes (Signed)
Dietary called regarding patient's breakfast tray, states will send promptly.

## 2018-08-27 NOTE — Consult Note (Signed)
[facility] Consult   Reason for Consult: Hallucinations Referring Physician: Dr. Owens Shark Patient Identification: Tristan Cook MRN:  [id number] Principal Diagnosis: MDD (major depressive disorder), recurrent, severe, with psychosis ([place]) Diagnosis:  Principal Problem:   MDD (major depressive disorder), recurrent, severe, with psychosis ([place]) Active Problems:   Essential hypertension   GERD (gastroesophageal reflux disease)   Insomnia   Back pain with radiation   Alcoholic peripheral neuropathy (HCC)   Vitamin B12 deficiency neuropathy ([place])   Hallucinations   Alcohol use disorder, severe, dependence ([place])  Total Time spent with patient: 1 hour  Subjective: "I have been off of my depression medicines for the past 6 months.  I have become more depressed and him having hallucinations of voices telling me that I have gotta go, I gotta die."  HPI:   From initial psychiatric intake: Tristan Cook is a 58 y.o. male patient presented to [facility] ED via EMS voluntarily.  The patient stated he came in due to him trying to stop drinking and was hearing voices.  The patient stated he was diagnosed with depression a few years ago.  He admits being prescribed medication but does not take it anymore.  He states due to his physical health he wants to stop drinking alcohol.  The patient voiced that outpatient alcohol abuse treatment facility will not help him.  He discussed "in order for me to get clean I need to be somewhere for months." On evaluation the patient is alert and oriented x4, calm and cooperative, and mood-congruent with affect. The patient does not appear to be responding to internal or external stimuli. Neither is the patient presenting with any delusional thinking. The patient admits to auditory hallucinations, but denies visual hallucinations. The patient denies any suicidal, homicidal, or self-harm ideations.   On reevaluation morning of 08/27/2018.  Patient reports that  he has been having auditory hallucinations telling him to die since October 2019.  He states that he had previously been treated for depression by his outpatient psychiatrist, Dr. Hoyle Barr, but has not had follow-up and has been out of his depression medications for 6 months.  Patient's PCP is Rosealee Albee. Record review shows that he was on Prozac 20 mg. Patient reports that he increased his alcohol intake, and was drinking 12 beers daily.  He states he left his home approximately 3 weeks ago, because "my sister has been telling me that I am dangerous."  He reports that he has attempted to cut back his alcohol use to 6 beers a day over the last 3 weeks.  He reports his last drink was at 10 PM on 08/26/2018.  He denies any history of withdrawal seizures or DTs.  Patient states that he lost his house sometime ago after being diagnosed with neuropathy in all 4 extremities, carpal tunnel, and arthritis in his knees.  He has not been able to work in the last 3 years, but now has Social Security disability.  Patient reports that he had been living in [place], but since he has left his home he has been staying in motels for the last 3 weeks.  He reported feeling, "unsafe with the voices telling me to die last night and I had the motel clerk called an ambulance for me."  Patient is agreeable to starting medications to decrease hallucinations as well as treat depression.  He is stating that he wants to quit using alcohol.  Collateral information was obtained from Ms. Hairston Blackshear (Girlfriend, home). Girlfriend  states that patient has had depressed mood for at least the last 6 months.  She related this to his medical conditions, and his need for arm surgery in February.  She states that he has been telling her about his hallucinations for at least the last 6 months.  She states that he had become increasingly paranoid and kept calling the police, to the point that he had charges pressed for abuse of emergency services.   She states that he would call her and tell her people were trying to break into the house, he knew that people were watching him through the walls, and talking to him.  The voices were continuing to tell him to kill himself.  Girlfriend believes that patient left his home in the evening approximately a month ago to get away from the voices.  She states he has been staying in motels for the last 2 to 3 weeks, but in the last few days has been calling her again telling her that the voices are back.  Girlfriend is supportive in patient seeking psychiatric help as well as help in alcohol use rehabilitation.  Past Psychiatric History: Depression; psychosis since 12/2017   Risk to Self: Suicidal Ideation: No Suicidal Intent: No Is patient at risk for suicide?: No Suicidal Plan?: No Access to Means: No What has been your use of drugs/alcohol within the last 12 months?: Daily use of alcohol How many times?: 0 Other Self Harm Risks: Denied  Triggers for Past Attempts: None known Intentional Self Injurious Behavior: NoneNo Risk to Others: Homicidal Ideation: No Thoughts of Harm to Others: No Current Homicidal Intent: No Current Homicidal Plan: No Access to Homicidal Means: No Identified Victim: None at this time History of harm to others?: No Assessment of Violence: None Noted Violent Behavior Description: None Does patient have access to weapons?: Yes (Comment)(Has a rifle) Criminal Charges Pending?: Yes Describe Pending Criminal Charges: Misuse of 911 Does patient have a court date: Yes Court Date: 08/05/20No Prior Inpatient Therapy: Prior Inpatient Therapy: No Prior Outpatient Therapy: Prior Outpatient Therapy: Yes Prior Therapy Dates: Current Prior Therapy Facilty/Provider(s): [facility] Reason for Treatment: Depression Does patient have an ACCT team?: No Does patient have Intensive In-House Services?  : No Does patient have [facility] services? : No Does patient have P4CC services?:  No  Past Medical History:  Past Medical History:  Diagnosis Date  . Abdominal pain   . Adenomatous colon polyp 2008  . Arthritis   . Back pain   . Bilateral lumbar radiculopathy   . Blurred vision, bilateral   . Chronic back pain   . Constipation   . Depression   . GERD (gastroesophageal reflux disease)   . Gout   . Hemorrhoids   . High cholesterol   . Hypertension   . Neck pain   . Neuropathy   . Neuropathy of both feet   . Numbness    rectal  . Numbness of legs   . Stroke ([place]) 2019  . Substance abuse ([place])    h/o excessive alcohol use; pt says he limits use to one to 2 beers daily he currently    Past Surgical History:  Procedure Laterality Date  . CATARACT EXTRACTION W/PHACO Right 08/16/2018   Procedure: CATARACT EXTRACTION PHACO AND INTRAOCULAR LENS PLACEMENT RIGHT EYE;  Surgeon: Baruch Goldmann, MD;  Location: [facility];  Service: Ophthalmology;  Laterality: Right;  CDE: 4.18  . COLONOSCOPY  10/09/2006   3 mm pedunculated sigmoid colon polyp removed/8-mm sessile hepatic flexure  polyp (tubular villous adenoma) removed/6-mm  descending colon polyp removed/small internal hemorrhoids  . COLONOSCOPY  02/28/2011   KGU:[id number], multiple in the rectum/Internal hemorrhoids, MODERATE-CAUSING RECTAL BLEEDING  . COLONOSCOPY N/A 07/05/2016   Procedure: COLONOSCOPY;  Surgeon: [name], MD;  Location: [facility];  Service: Endoscopy;  Laterality: N/A;  11:15am  . 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 Mild erosive gastrtitis most likely due to indomethacin   . FINGER SURGERY Right    4th and 5th digit  . FLEXIBLE SIGMOIDOSCOPY  01/02/2012   SLF: 1. the colonic mucosa appeared normal in the sigmoid colon 2. Large internal hemorrhoids: cause for rectal bleeding/pain . s/p banding X 3   . HAND SURGERY    . REVERSE SHOULDER ARTHROPLASTY Left 05/29/2018   Procedure: REVERSE SHOULDER ARTHROPLASTY;  Surgeon: Hiram Gash,  MD;  Location: [facility];  Service: Orthopedics;  Laterality: Left;  . SAVORY DILATION N/A 07/21/2014   Procedure: SAVORY DILATION;  Surgeon: [name], MD;  Location: [facility];  Service: Endoscopy;  Laterality: N/A;   Family History:  Family History  Problem Relation Age of Onset  . Hypertension Mother   . Hypertension Father   . Stroke Father   . Hypertension Sister   . Hypertension Brother   . Cancer Brother   . Cancer Brother        throat cancer  . Hypertension Brother   . Diabetes Maternal Grandmother   . Colon cancer Neg Hx    Family Psychiatric  History: Patient states sister may have an underlying psychiatric disorder  Unknown Social History:  Social History   Substance and Sexual Activity  Alcohol Use Yes  . Alcohol/week: 3.0 standard drinks  . Types: 3 Cans of beer per week   Comment: drinks 6 pack of beer on weekends     Social History   Substance and Sexual Activity  Drug Use No    Social History   Socioeconomic History  . Marital status: Single    Spouse name: Not on file  . Number of children: 0  . Years of education: 60  . Highest education level: Not on file  Occupational History  . Occupation: unemployed, Retail buyer: UNEMPLOYED  Social Needs  . Financial resource strain: Not on file  . Food insecurity    Worry: Not on file    Inability: Not on file  . Transportation needs    Medical: No    Non-medical: No  Tobacco Use  . Smoking status: Former Smoker    Packs/day: 1.50    Years: 33.00    Pack years: 49.50    Types: Cigarettes    Quit date: 08/16/2004    Years since quitting: 14.0  . Smokeless tobacco: Current User    Types: Snuff  . Tobacco comment: Dips every once in a while  Substance and Sexual Activity  . Alcohol use: Yes    Alcohol/week: 3.0 standard drinks    Types: 3 Cans of beer per week    Comment: drinks 6 pack of beer on weekends  . Drug use: No  . Sexual activity: Not on file  Lifestyle  .  Physical activity    Days per week: Not on file    Minutes per session: Not on file  . Stress: Not on file  Relationships  . Social Herbalist on phone: Not on file    Gets together:  Not on file    Attends religious service: Not on file    Active member of club or organization: Not on file    Attends meetings of clubs or organizations: Not on file    Relationship status: Not on file  Other Topics Concern  . Not on file  Social History Narrative   Lives alone   No caffeine   Right handed   Additional Social History:    Patient has not worked in the past 3 years due to neuropathy, carpal tunnel, arthritis.  He lost his house in the past few years.  He recently has been reinstated on Social Security disability.  Patient has his own apartment and eating which she has not been staying in due to fear of his auditory hallucinations.  Patient has been married twice with no children  Patient is currently in a relationship with a supportive partner.  They do not cohabitate.  Allergies:   Allergies  Allergen Reactions  . Hydrocodone     hallucinations from hydrocodone  . Benadryl [Diphenhydramine Hcl (Sleep)] Other (See Comments)    Palpitations    Labs:  Results for orders placed or performed during the hospital encounter of 08/27/18 (from the past 48 hour(s))  Comprehensive metabolic panel     Status: Abnormal   Collection Time: 08/27/18 12:40 AM  Result Value Ref Range   Sodium 131 (L) 135 - 145 mmol/L   Potassium 3.3 (L) 3.5 - 5.1 mmol/L   Chloride 98 98 - 111 mmol/L   CO2 20 (L) 22 - 32 mmol/L   Glucose, Bld 98 70 - 99 mg/dL   BUN <5 (L) 6 - 20 mg/dL   Creatinine, Ser 0.88 0.61 - 1.24 mg/dL   Calcium 8.7 (L) 8.9 - 10.3 mg/dL   Total Protein 7.8 6.5 - 8.1 g/dL   Albumin 3.5 3.5 - 5.0 g/dL   AST 78 (H) 15 - 41 U/L   ALT 30 0 - 44 U/L   Alkaline Phosphatase 95 38 - 126 U/L   Total Bilirubin 0.8 0.3 - 1.2 mg/dL   GFR calc non Af Amer >60 >60 mL/min   GFR calc  Af Amer >60 >60 mL/min   Anion gap 13 5 - 15    Comment: Performed at [facility], [address] [place], [place] [address]  Ethanol     Status: Abnormal   Collection Time: 08/27/18 12:40 AM  Result Value Ref Range   Alcohol, Ethyl (B) 208 (H) <10 mg/dL    Comment: (NOTE) Lowest detectable limit for serum alcohol is 10 mg/dL. For medical purposes only. Performed at [facility], [address] [place], [place] [address]   Salicylate level     Status: None   Collection Time: 08/27/18 12:40 AM  Result Value Ref Range   Salicylate Lvl <5.0 2.8 - 30.0 mg/dL    Comment: Performed at [facility], [address] [place], [place] [address]  Acetaminophen level     Status: Abnormal   Collection Time: 08/27/18 12:40 AM  Result Value Ref Range   Acetaminophen (Tylenol), Serum <10 (L) 10 - 30 ug/mL    Comment: (NOTE) Therapeutic concentrations vary significantly. A range of 10-30 ug/mL  may be an effective concentration for many patients. However, some  are best treated at concentrations outside of this range. Acetaminophen concentrations >150 ug/mL at 4 hours after ingestion  and >50 ug/mL at 12 hours after ingestion are often associated with  toxic reactions. Performed  at [facility], [address] [place], [place] [address]   cbc     Status: Abnormal   Collection Time: 08/27/18 12:40 AM  Result Value Ref Range   WBC 4.4 4.0 - 10.5 K/uL   RBC 4.54 4.22 - 5.81 MIL/uL   Hemoglobin 13.8 13.0 - 17.0 g/dL   HCT 37.7 (L) 39.0 - 52.0 %   MCV 83.0 80.0 - 100.0 fL   MCH 30.4 26.0 - 34.0 pg   MCHC 36.6 (H) 30.0 - 36.0 g/dL   RDW 13.8 11.5 - 15.5 %   Platelets 214 150 - 400 K/uL   nRBC 0.0 0.0 - 0.2 %    Comment: Performed at [facility], [address] [place], [place] [address]  Urine Drug Screen, Qualitative     Status: None   Collection Time: 08/27/18 12:40 AM  Result Value Ref Range   Tricyclic, Ur Screen NONE DETECTED NONE  DETECTED   Amphetamines, Ur Screen NONE DETECTED NONE DETECTED   MDMA (Ecstasy)Ur Screen NONE DETECTED NONE DETECTED   Cocaine Metabolite,Ur [place] NONE DETECTED NONE DETECTED   Opiate, Ur Screen NONE DETECTED NONE DETECTED   Phencyclidine (PCP) Ur S NONE DETECTED NONE DETECTED   Cannabinoid 50 Ng, Ur [place] NONE DETECTED NONE DETECTED   Barbiturates, Ur Screen NONE DETECTED NONE DETECTED   Benzodiazepine, Ur Scrn NONE DETECTED NONE DETECTED   Methadone Scn, Ur NONE DETECTED NONE DETECTED    Comment: (NOTE) Tricyclics + metabolites, urine    Cutoff 1000 ng/mL Amphetamines + metabolites, urine  Cutoff 1000 ng/mL MDMA (Ecstasy), urine              Cutoff 500 ng/mL Cocaine Metabolite, urine          Cutoff 300 ng/mL Opiate + metabolites, urine        Cutoff 300 ng/mL Phencyclidine (PCP), urine         Cutoff 25 ng/mL Cannabinoid, urine                 Cutoff 50 ng/mL Barbiturates + metabolites, urine  Cutoff 200 ng/mL Benzodiazepine, urine              Cutoff 200 ng/mL Methadone, urine                   Cutoff 300 ng/mL The urine drug screen provides only a preliminary, unconfirmed analytical test result and should not be used for non-medical purposes. Clinical consideration and professional judgment should be applied to any positive drug screen result due to possible interfering substances. A more specific alternate chemical method must be used in order to obtain a confirmed analytical result. Gas chromatography / mass spectrometry (GC/MS) is the preferred confirmat ory method. Performed at [facility], [address] [place], [place] [address]     Current Facility-Administered Medications  Medication Dose Route Frequency Provider Last Rate Last Dose  . aspirin EC tablet 81 mg  81 mg Oral Daily Lavella Hammock, MD      . Derrill Memo ON 08/28/2018] calcium-vitamin D (OSCAL WITH D) 500-200 MG-UNIT per tablet 1 tablet  1 tablet Oral Q breakfast Lavella Hammock, MD      .  cholecalciferol (VITAMIN D3) tablet 400 Units  400 Units Oral Daily Lavella Hammock, MD      . FLUoxetine (PROZAC) capsule 20 mg  20 mg Oral Daily Lavella Hammock, MD      . folic acid (FOLVITE) tablet 400 mcg  400 mcg Oral Daily Lavella Hammock, MD      . LORazepam (ATIVAN) injection 0-4 mg  0-4 mg Intravenous Q6H Merlyn Lot, MD       Or  . LORazepam (ATIVAN) tablet 0-4 mg  0-4 mg Oral Q6H Merlyn Lot, MD   2 mg at 08/27/18 0924  . [START ON 08/29/2018] LORazepam (ATIVAN) injection 0-4 mg  0-4 mg Intravenous Q12H Merlyn Lot, MD       Or  . Derrill Memo ON 08/29/2018] LORazepam (ATIVAN) tablet 0-4 mg  0-4 mg Oral Q12H Merlyn Lot, MD      . meloxicam [facility]) tablet 15 mg  15 mg Oral Daily Lavella Hammock, MD      . metoprolol tartrate (LOPRESSOR) tablet 25 mg  25 mg Oral BID Lavella Hammock, MD      . moxifloxacin (VIGAMOX) 0.5 % ophthalmic solution 1 drop  1 drop Both Eyes TID Lavella Hammock, MD      . multivitamin with minerals tablet 1 tablet  1 tablet Oral Daily Lavella Hammock, MD      . prednisoLONE acetate (PRED FORTE) 1 % ophthalmic suspension 1 drop  1 drop Both Eyes QID Lavella Hammock, MD      . QUEtiapine (SEROQUEL) tablet 50 mg  50 mg Oral Once Lavella Hammock, MD       Followed by  . QUEtiapine (SEROQUEL) tablet 50 mg  50 mg Oral QHS Lavella Hammock, MD      . thiamine (VITAMIN B-1) tablet 100 mg  100 mg Oral Daily Merlyn Lot, MD   100 mg at 08/27/18 1610   Or  . thiamine (B-1) injection 100 mg  100 mg Intravenous Daily Merlyn Lot, MD      . vitamin B-12 (CYANOCOBALAMIN) tablet 5,000 mcg  5,000 mcg Oral Daily Lavella Hammock, MD       Current Outpatient Medications  Medication Sig Dispense Refill  . aspirin EC 81 MG tablet Take 81 mg by mouth daily.     . calcium-vitamin D (CALCIUM 500/D) 500-200 MG-UNIT tablet Take 1 tablet by mouth daily with breakfast.    . cholecalciferol (VITAMIN D-400) 10 MCG (400 UNIT) TABS tablet Take 400  Units by mouth daily.    . folic acid (FOLVITE) 960 MCG tablet Take 400 mcg by mouth daily.    . meloxicam (MOBIC) 15 MG tablet Take 15 mg by mouth daily.    . metoprolol tartrate (LOPRESSOR) 50 MG tablet Take 25 mg by mouth 2 (two) times daily.    [name] moxifloxacin (VIGAMOX) 0.5 % ophthalmic solution Place 1 drop into both eyes 3 (three) times daily. In operative eye only    . Multiple Vitamin (MULTIVITAMIN WITH MINERALS) TABS tablet Take 1 tablet by mouth daily.    . prednisoLONE acetate (PRED FORTE) 1 % ophthalmic suspension Place 1 drop into both eyes 4 (four) times daily. In operative eye only    . vitamin B-12 (CYANOCOBALAMIN) 1000 MCG tablet Take 5,000 mcg by mouth daily.       Musculoskeletal: Strength & Muscle Tone: decreased Gait & Station: unsteady Patient leans: Backward  Psychiatric Specialty Exam: Physical Exam  Nursing note and vitals reviewed. Constitutional: He is oriented to person, place, and time. He appears well-developed and well-nourished. He appears distressed.  HENT:  Head: Normocephalic and atraumatic.  Eyes: EOM are normal.  Neck: Normal range of motion.  Cardiovascular: Normal rate and regular rhythm.  Respiratory: Effort normal. No respiratory  distress.  Musculoskeletal:        General: Tenderness present.  Neurological: He is alert and oriented to person, place, and time.    Review of Systems  Constitutional: Negative.   Respiratory: Negative.   Cardiovascular: Negative.   Gastrointestinal: Negative.   Musculoskeletal: Positive for joint pain.  Neurological: Positive for tingling, tremors and sensory change.  Psychiatric/Behavioral: Positive for depression, hallucinations and substance abuse. Negative for memory loss and suicidal ideas. The patient is nervous/anxious and has insomnia.   All other systems reviewed and are negative.   Blood pressure (!) 156/105, pulse (!) 115, temperature 98.7 F (37.1 C), temperature source Oral, resp. rate 16, SpO2  96 %.There is no height or weight on file to calculate BMI.  General Appearance: Fairly Groomed  Eye Contact:  Minimal  Speech:  Garbled  Volume:  Decreased  Mood:  Anxious, Depressed, Hopeless and Fearful  Affect:  Congruent  Thought Process:  Coherent and Descriptions of Associations: Tangential  Orientation:  Full (Time, Place, and Person)  Thought Content:  Logical, Hallucinations: Auditory, Ideas of Reference:   Paranoia, Paranoid Ideation, Rumination and Tangential  Suicidal Thoughts:  No  Homicidal Thoughts:  No  Memory:  Immediate;   Fair Recent;   Fair  Judgement:  Poor  Insight:  Lacking  Psychomotor Activity:  Decreased  Concentration:  Concentration: Fair and Attention Span: Fair  Recall:  Good  Fund of Knowledge:  Fair  Language:  Good  Akathisia:  NA  Handed:  Right  AIMS (if indicated):     Assets:  Desire for Improvement Financial Resources/Insurance Housing Physical Health Social Support  ADL's:  Intact  Cognition:  WNL  Sleep:   Poor     Treatment Plan Summary: Patient signs for voluntary admission to inpatient psychiatry Daily contact with patient to assess and evaluate symptoms and progress in treatment and Medication management  Re-start Prozac 20 mg daily for depression Start Seroquel 50 mg PO for psychosis first dose now, then QHS Continue CIWA   Disposition: Recommend psychiatric Inpatient admission when medically cleared. Supportive therapy provided about ongoing stressors. Refer to IOP.  For alcohol use rehabilitation after psychiatrically cleared COVID-19 screening pending Hemoglobin A1c, lipid, TSH added to existing lab work for baseline screening prior to antipsychotics. Admission orders placed.  Lavella Hammock, MD 08/27/2018 2:11 PM

## 2018-08-27 NOTE — ED Notes (Signed)
Report to include Situation, Background, Assessment, and Recommendations received from [facility]. Patient alert and oriented, warm and dry, in no acute distress. Patient denies HI, AVH and pain. Patient states he has SI without plan. Patient made aware of Q15 minute rounds and Engineer, drilling presence for their safety. Patient instructed to come to me with needs or concerns.

## 2018-08-27 NOTE — ED Notes (Signed)
Pt up to the restroom at this time with cane used as assistance.

## 2018-08-27 NOTE — ED Notes (Signed)
Dr. Barrington Ellison at bedside, pt's breakfast tray placed at bedside. Will continue to monitor for further patient needs.

## 2018-08-27 NOTE — BH Specialist Note (Signed)
Demographic information faxed to [facility].  Referral Information refaxed  . [place] [phone] or [phone])  . [place] [phone]),   . [facility] [phone])  . [place] [phone])

## 2018-08-27 NOTE — Tx Team (Signed)
Initial Treatment Plan 08/27/2018 11:59 PM [name] [id number]    PATIENT STRESSORS: Medication change or noncompliance Substance abuse   PATIENT STRENGTHS: Motivation for treatment/growth Work skills   PATIENT IDENTIFIED PROBLEMS: Substance Abuse                      DISCHARGE CRITERIA:  Improved stabilization in mood, thinking, and/or behavior Medical problems require only outpatient monitoring  PRELIMINARY DISCHARGE PLAN: Outpatient therapy  PATIENT/FAMILY INVOLVEMENT: This treatment plan has been presented to and reviewed with the patient, Tristan Cook,  The patient and family have been given the opportunity to ask questions and make suggestions.  Harl Bowie, RN 08/27/2018, 11:59 PM

## 2018-08-27 NOTE — ED Notes (Signed)
Snack and beverage given. 
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
## 2018-08-27 NOTE — ED Notes (Signed)
Cup of ice water given to patient at this time.

## 2018-08-27 NOTE — ED Notes (Signed)
Pt reports he has started to lesson daily ETOH use and is down to 6 beers per day and since has had auditory voices.
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
## 2018-08-27 NOTE — ED Triage Notes (Signed)
Pt arrived via EMS from the [facility] where pt called due to hearing voices that were telling him to hurt himself. Per pt, he has been hearing voices x1 month. Pt is calm and cooperative at this time. Pt denies SI and HI at this time.

## 2018-08-27 NOTE — BH Assessment (Addendum)
Patient is to be admitted to [facility] by Dr. Leverne Humbles.  Attending Physician will be Dr. Weber Cooks.   Patient has been assigned to room 323, by [facility] Charge Nurse Dementria.   Intake Paper Work has been signed and placed on patient chart.  ER staff is aware of the admission:  Vaughan Basta, ER Secretary    Dr. Quentin Cornwall, ER MD   Jinny Blossom, Patient's Nurse   Gust Rung. Patient Access.

## 2018-08-27 NOTE — ED Notes (Signed)
Pt given phone to cancel Dr. Hilaria Ota. Pt also given his phone to enter his PIN to turn it off.

## 2018-08-27 NOTE — Consult Note (Signed)
[place] [facility] Consult   Reason for Consult: Hallucinations Referring Physician: Dr. Owens Shark Patient Identification: Tristan Cook MRN:  [id number] Principal Diagnosis: <principal problem not specified> Diagnosis:  Active Problems:   Alcohol intoxication ([place])   Total Time spent with patient: 1 hour  Subjective: "I am trying to stop drinking and I need inpatient treatment facility to go to." Tristan Cook is a 58 y.o. male patient presented to [facility] ED via EMS voluntarily.  The patient stated he came in due to him trying to stop drinking and was hearing voices.  The patient stated he was diagnosed with depression a few years ago.  He admits being prescribed medication but does not take it anymore.  He states due to his physical health he wants to stop drinking alcohol.  The patient voiced that outpatient alcohol abuse treatment facility will not help him.  He discussed "in order for me to get clean I need to be somewhere for months." The patient was seen face-to-face by this provider; chart reviewed and consulted with Dr. Owens Shark on 08/27/2018 due to the care of the patient. It was discussed with the provider that the patient does not meet criteria to be admitted to the inpatient psychiatric unit.  It was also discussed with Dr. Owens Shark that the patient is currently requesting a facility to detox from alcohol use.  It was also presented Dr. Owens Shark that [facility] inpatient facility does not provide detoxification services.  On evaluation the patient is alert and oriented x4, calm and cooperative, and mood-congruent with affect. The patient does not appear to be responding to internal or external stimuli. Neither is the patient presenting with any delusional thinking. The patient admits to auditory hallucinations, but denies visual hallucinations. The patient denies any suicidal, homicidal, or self-harm ideations. The patient is not presenting with any psychotic or paranoid behaviors. During an  encounter with the patient, he was able to answer questions appropriately. Collateral information was not obtained from Ms. Hairston [phone] (home) due to the time of the patient assessment.   Plan: The patient is not a safety risk to self or others and does not require psychiatric inpatient admission for stabilization and treatment.  The patient will be referred to inpatient alcohol  facility once a bed becomes available HPI:    Past Psychiatric History:  Depression  Risk to Self:  No Risk to Others:  No Prior Inpatient Therapy:   Prior Outpatient Therapy:    Past Medical History:  Past Medical History:  Diagnosis Date  . Abdominal pain   . Adenomatous colon polyp 2008  . Arthritis   . Back pain   . Bilateral lumbar radiculopathy   . Blurred vision, bilateral   . Chronic back pain   . Constipation   . Depression   . GERD (gastroesophageal reflux disease)   . Gout   . Hemorrhoids   . High cholesterol   . Hypertension   . Neck pain   . Neuropathy   . Neuropathy of both feet   . Numbness    rectal  . Numbness of legs   . Stroke ([place]) 2019  . Substance abuse ([place])    h/o excessive alcohol use; pt says he limits use to one to 2 beers daily he currently    Past Surgical History:  Procedure Laterality Date  . CATARACT EXTRACTION W/PHACO Right 08/16/2018   Procedure: CATARACT EXTRACTION PHACO AND INTRAOCULAR LENS PLACEMENT RIGHT EYE;  Surgeon: Baruch Goldmann, MD;  Location: [facility];  Service: Ophthalmology;  Laterality: Right;  CDE: 4.18  . COLONOSCOPY  10/09/2006   3 mm pedunculated sigmoid colon polyp removed/8-mm sessile hepatic flexure polyp (tubular villous adenoma) removed/6-mm  descending colon polyp removed/small internal hemorrhoids  . COLONOSCOPY  02/28/2011   [id number], multiple in the rectum/Internal hemorrhoids, MODERATE-CAUSING RECTAL BLEEDING  . COLONOSCOPY N/A 07/05/2016   Procedure: COLONOSCOPY;  Surgeon: [name], MD;  Location: [facility];   Service: Endoscopy;  Laterality: N/A;  11:15am  . 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 Mild erosive gastrtitis most likely due to indomethacin   . FINGER SURGERY Right    4th and 5th digit  . FLEXIBLE SIGMOIDOSCOPY  01/02/2012   SLF: 1. the colonic mucosa appeared normal in the sigmoid colon 2. Large internal hemorrhoids: cause for rectal bleeding/pain . s/p banding X 3   . HAND SURGERY    . REVERSE SHOULDER ARTHROPLASTY Left 05/29/2018   Procedure: REVERSE SHOULDER ARTHROPLASTY;  Surgeon: Hiram Gash, MD;  Location: [place];  Service: Orthopedics;  Laterality: Left;  . SAVORY DILATION N/A 07/21/2014   Procedure: SAVORY DILATION;  Surgeon: [name], MD;  [facility];  Service: Endoscopy;  Laterality: N/A;   Family History:  Family History  Problem Relation Age of Onset  . Hypertension Mother   . Hypertension Father   . Stroke Father   . Hypertension Sister   . Hypertension Brother   . Cancer Brother   . Cancer Brother        throat cancer  . Hypertension Brother   . Diabetes Maternal Grandmother   . Colon cancer Neg Hx    Family Psychiatric  History:  Unknown Social History:  Social History   Substance and Sexual Activity  Alcohol Use Yes  . Alcohol/week: 3.0 standard drinks  . Types: 3 Cans of beer per week   Comment: drinks 6 pack of beer on weekends     Social History   Substance and Sexual Activity  Drug Use No    Social History   Socioeconomic History  . Marital status: Single    Spouse name: Not on file  . Number of children: 0  . Years of education: 63  . Highest education level: Not on file  Occupational History  . Occupation: unemployed, Retail buyer: UNEMPLOYED  Social Needs  . Financial resource strain: Not on file  . Food insecurity    Worry: Not on file    Inability: Not on file  . Transportation needs    Medical: No    Non-medical: No  Tobacco Use   . Smoking status: Former Smoker    Packs/day: 1.50    Years: 33.00    Pack years: 49.50    Types: Cigarettes    Quit date: 08/16/2004    Years since quitting: 14.0  . Smokeless tobacco: Current User    Types: Snuff  . Tobacco comment: Dips every once in a while  Substance and Sexual Activity  . Alcohol use: Yes    Alcohol/week: 3.0 standard drinks    Types: 3 Cans of beer per week    Comment: drinks 6 pack of beer on weekends  . Drug use: No  . Sexual activity: Not on file  Lifestyle  . Physical activity    Days per week: Not on file    Minutes per session: Not on file  . Stress: Not on file  Relationships  .  Social Herbalist on phone: Not on file    Gets together: Not on file    Attends religious service: Not on file    Active member of club or organization: Not on file    Attends meetings of clubs or organizations: Not on file    Relationship status: Not on file  Other Topics Concern  . Not on file  Social History Narrative   Lives alone   No caffeine   Right handed   Additional Social History:    Allergies:   Allergies  Allergen Reactions  . Hydrocodone     hallucinations from hydrocodone  . Benadryl [Diphenhydramine Hcl (Sleep)] Other (See Comments)    Palpitations    Labs:  Results for orders placed or performed during the hospital encounter of 08/27/18 (from the past 48 hour(s))  Comprehensive metabolic panel     Status: Abnormal   Collection Time: 08/27/18 12:40 AM  Result Value Ref Range   Sodium 131 (L) 135 - 145 mmol/L   Potassium 3.3 (L) 3.5 - 5.1 mmol/L   Chloride 98 98 - 111 mmol/L   CO2 20 (L) 22 - 32 mmol/L   Glucose, Bld 98 70 - 99 mg/dL   BUN <5 (L) 6 - 20 mg/dL   Creatinine, Ser 0.88 0.61 - 1.24 mg/dL   Calcium 8.7 (L) 8.9 - 10.3 mg/dL   Total Protein 7.8 6.5 - 8.1 g/dL   Albumin 3.5 3.5 - 5.0 g/dL   AST 78 (H) 15 - 41 U/L   ALT 30 0 - 44 U/L   Alkaline Phosphatase 95 38 - 126 U/L   Total Bilirubin 0.8 0.3 - 1.2 mg/dL    GFR calc non Af Amer >60 >60 mL/min   GFR calc Af Amer >60 >60 mL/min   Anion gap 13 5 - 15    Comment: Performed at [facility], [address] [place], [place] [address]  Ethanol     Status: Abnormal   Collection Time: 08/27/18 12:40 AM  Result Value Ref Range   Alcohol, Ethyl (B) 208 (H) <10 mg/dL    Comment: (NOTE) Lowest detectable limit for serum alcohol is 10 mg/dL. For medical purposes only. Performed at [facility], [address] [place], [place] [address]   Salicylate level     Status: None   Collection Time: 08/27/18 12:40 AM  Result Value Ref Range   Salicylate Lvl <7.5 2.8 - 30.0 mg/dL    Comment: Performed at [facility], [address] [place], [place] [address]  Acetaminophen level     Status: Abnormal   Collection Time: 08/27/18 12:40 AM  Result Value Ref Range   Acetaminophen (Tylenol), Serum <10 (L) 10 - 30 ug/mL    Comment: (NOTE) Therapeutic concentrations vary significantly. A range of 10-30 ug/mL  may be an effective concentration for many patients. However, some  are best treated at concentrations outside of this range. Acetaminophen concentrations >150 ug/mL at 4 hours after ingestion  and >50 ug/mL at 12 hours after ingestion are often associated with  toxic reactions. Performed at [facility], [address] [place], [place] [address]   cbc     Status: Abnormal   Collection Time: 08/27/18 12:40 AM  Result Value Ref Range   WBC 4.4 4.0 - 10.5 K/uL   RBC 4.54 4.22 - 5.81 MIL/uL   Hemoglobin 13.8 13.0 - 17.0 g/dL   HCT 37.7 (L) 39.0 - 52.0 %  MCV 83.0 80.0 - 100.0 fL   MCH 30.4 26.0 - 34.0 pg   MCHC 36.6 (H) 30.0 - 36.0 g/dL   RDW 13.8 11.5 - 15.5 %   Platelets 214 150 - 400 K/uL   nRBC 0.0 0.0 - 0.2 %    Comment: Performed at [facility], [address], [place], [place] [address]  Urine Drug Screen, Qualitative     Status: None   Collection Time: 08/27/18 12:40 AM  Result Value Ref  Range   Tricyclic, Ur Screen NONE DETECTED NONE DETECTED   Amphetamines, Ur Screen NONE DETECTED NONE DETECTED   MDMA (Ecstasy)Ur Screen NONE DETECTED NONE DETECTED   Cocaine Metabolite,Ur [place] NONE DETECTED NONE DETECTED   Opiate, Ur Screen NONE DETECTED NONE DETECTED   Phencyclidine (PCP) Ur S NONE DETECTED NONE DETECTED   Cannabinoid 50 Ng, Ur  NONE DETECTED NONE DETECTED   Barbiturates, Ur Screen NONE DETECTED NONE DETECTED   Benzodiazepine, Ur Scrn NONE DETECTED NONE DETECTED   Methadone Scn, Ur NONE DETECTED NONE DETECTED    Comment: (NOTE) Tricyclics + metabolites, urine    Cutoff 1000 ng/mL Amphetamines + metabolites, urine  Cutoff 1000 ng/mL MDMA (Ecstasy), urine              Cutoff 500 ng/mL Cocaine Metabolite, urine          Cutoff 300 ng/mL Opiate + metabolites, urine        Cutoff 300 ng/mL Phencyclidine (PCP), urine         Cutoff 25 ng/mL Cannabinoid, urine                 Cutoff 50 ng/mL Barbiturates + metabolites, urine  Cutoff 200 ng/mL Benzodiazepine, urine              Cutoff 200 ng/mL Methadone, urine                   Cutoff 300 ng/mL The urine drug screen provides only a preliminary, unconfirmed analytical test result and should not be used for non-medical purposes. Clinical consideration and professional judgment should be applied to any positive drug screen result due to possible interfering substances. A more specific alternate chemical method must be used in order to obtain a confirmed analytical result. Gas chromatography / mass spectrometry (GC/MS) is the preferred confirmat ory method. Performed at [facility], [address] [place], [place] [address]     No current facility-administered medications for this encounter.    Current Outpatient Medications  Medication Sig Dispense Refill  . aspirin EC 81 MG tablet Take 81 mg by mouth daily.     . calcium-vitamin D (CALCIUM 500/D) 500-200 MG-UNIT tablet Take 1 tablet by mouth daily with  breakfast.    . cholecalciferol (VITAMIN D-400) 10 MCG (400 UNIT) TABS tablet Take 400 Units by mouth daily.    . folic acid (FOLVITE) 892 MCG tablet Take 400 mcg by mouth daily.    . meloxicam (MOBIC) 15 MG tablet Take 15 mg by mouth daily.    . metoprolol tartrate (LOPRESSOR) 50 MG tablet Take 25 mg by mouth 2 (two) times daily.    . Multiple Vitamin (MULTIVITAMIN WITH MINERALS) TABS tablet Take 1 tablet by mouth daily.    . vitamin B-12 (CYANOCOBALAMIN) 1000 MCG tablet Take 5,000 mcg by mouth daily.       Musculoskeletal: Strength & Muscle Tone: decreased Gait & Station: unsteady Patient leans: Backward  Psychiatric Specialty Exam: Physical Exam  Nursing  note and vitals reviewed. Constitutional: He is oriented to person, place, and time. He appears well-developed and well-nourished.  HENT:  Head: Normocephalic.  Eyes: Pupils are equal, round, and reactive to light. Conjunctivae are normal.  Neck: Normal range of motion. Neck supple.  Cardiovascular: Normal rate and regular rhythm.  Respiratory: Effort normal.  Musculoskeletal:        General: Tenderness present.  Neurological: He is alert and oriented to person, place, and time.  Skin: Skin is warm and dry.  Psychiatric: Thought content normal.    Review of Systems  Musculoskeletal: Positive for joint pain.  Neurological: Positive for tingling, tremors and sensory change.  Psychiatric/Behavioral: Positive for depression, hallucinations and substance abuse. The patient is nervous/anxious.   All other systems reviewed and are negative.   Blood pressure (!) 142/93, pulse (!) 103, temperature 98.6 F (37 C), temperature source Oral, resp. rate 18, SpO2 100 %.There is no height or weight on file to calculate BMI.  General Appearance: Fairly Groomed  Eye Contact:  Minimal  Speech:  Clear and Coherent  Volume:  Normal  Mood:  Anxious and Depressed  Affect:  Congruent  Thought Process:  Coherent  Orientation:  Full (Time,  Place, and Person)  Thought Content:  Illogical  Suicidal Thoughts:  No  Homicidal Thoughts:  No  Memory:  Immediate;   Fair Recent;   Fair  Judgement:  Poor  Insight:  Lacking  Psychomotor Activity:  Decreased  Concentration:  Concentration: Fair and Attention Span: Fair  Recall:  Good  Fund of Knowledge:  Fair  Language:  Good  Akathisia:  NA  Handed:  Right  AIMS (if indicated):     Assets:  Desire for Improvement Physical Health Social Support  ADL's:  Impaired  Cognition:  WNL  Sleep:   Not well     Treatment Plan Summary: Daily contact with patient to assess and evaluate symptoms and progress in treatment, Medication management and Plan The patient does not meet criteria for psychiatric inpatient admission but will be referral to TTS for inpatient alcohol use disorder treatment facility.  Disposition: No evidence of imminent risk to self or others at present.   Patient does not meet criteria for psychiatric inpatient admission. Supportive therapy provided about ongoing stressors. Refer to IOP. Discussed crisis plan, support from social network, calling 911, coming to the Emergency Department, and calling Suicide Hotline.  Lamont Dowdy, NP 08/27/2018 3:26 AM

## 2018-08-27 NOTE — ED Notes (Signed)
Pt given meal tray at this time, pt given remote and repositioned in bed at this time for comfort. Will continue to monitor for further patient needs.

## 2018-08-27 NOTE — ED Notes (Signed)
Message sent to pharmacy regarding Vigamox drops asking them to send to main ED.

## 2018-08-27 NOTE — ED Notes (Signed)
Hourly rounding reveals patient sleeping in room. No complaints, stable, in no acute distress. Q15 minute rounds and monitoring via Rover and Officer to continue.  

## 2018-08-27 NOTE — ED Notes (Signed)
Pt given phone and breakfast tray per his request, states he is feeling much better since being given the Ativan. Will continue to monitor for further patient needs.

## 2018-08-28 ENCOUNTER — Other Ambulatory Visit: Payer: Self-pay

## 2018-08-28 DIAGNOSIS — F333 Major depressive disorder, recurrent, severe with psychotic symptoms: Principal | ICD-10-CM

## 2018-08-28 MED ORDER — POTASSIUM CHLORIDE CRYS ER 20 MEQ PO TBCR
40.0000 meq | EXTENDED_RELEASE_TABLET | Freq: Once | ORAL | Status: AC
  Administered 2018-08-28: 40 meq via ORAL
  Filled 2018-08-28: qty 2

## 2018-08-28 NOTE — BHH Counselor (Signed)
Adult Comprehensive Assessment  Patient ID: Tristan Cook, male   DOB: 1960/07/11, 58 y.o.   MRN: [id number]  Information Source: Information source: Patient  Current Stressors:  Patient states their primary concerns and needs for treatment are:: Pt reports "wanted to stop drinking, my medication was doing no good". Patient states their goals for this hospitilization and ongoing recovery are:: Pt reports "Pretty much did that.  Get these voices out my head, they are trying to leave." Housing / Lack of housing: Pt reports "want to find a different place outside of [place]". Physical health (include injuries & life threatening diseases): CSW notes pt walks with a walker. Substance abuse: Pt reports alcohol use.  Living/Environment/Situation:  Living Arrangements: Alone How long has patient lived in current situation?: Pt reports "almost a year". What is atmosphere in current home: Chaotic(Pt reports "can't get rest while there, some young dude keeps messing with my door".)  Family History:  Marital status: Divorced Divorced, when?: 2019 What types of issues is patient dealing with in the relationship?: Pt reports "her two kids and her not wanting to do any work in raising them." Are you sexually active?: No What is your sexual orientation?: Heterosexual Has your sexual activity been affected by drugs, alcohol, medication, or emotional stress?: Pt denies. Does patient have children?: No  Childhood History:  By whom was/is the patient raised?: Mother Description of patient's relationship with caregiver when they were a child: Pt reports "uncivilized". Patient's description of current relationship with people who raised him/her: Pt reports "good". How were you disciplined when you got in trouble as a child/adolescent?: Pt reports "wasn't". Does patient have siblings?: Yes Number of Siblings: 3 Description of patient's current relationship with siblings: Pt reports that he has a "good"  relationship with his two brothers but no relationship with his sister. Did patient suffer any verbal/emotional/physical/sexual abuse as a child?: No Did patient suffer from severe childhood neglect?: No Has patient ever been sexually abused/assaulted/raped as an adolescent or adult?: No Was the patient ever a victim of a crime or a disaster?: No Witnessed domestic violence?: No Has patient been effected by domestic violence as an adult?: No  Education:  Highest grade of school patient has completed: 12th grade Currently a student?: No Learning disability?: Yes What learning problems does patient have?: Pt reports "dyslexia".  Employment/Work Situation:   Employment situation: On disability Why is patient on disability: Pt reports "my wrist, knees, ankles". How long has patient been on disability: Pt reports "Oct 2019" Patient's job has been impacted by current illness: No What is the longest time patient has a held a job?: 5 years Where was the patient employed at that time?: [employer] Did You Receive Any Psychiatric Treatment/Services While in the Military?: No(NA) Are There Guns or Other Weapons in [place]?: No  Financial Resources:   Museum/gallery curator resources: [employer], [employer], Food stamps Does patient have a Programmer, applications or guardian?: No  Alcohol/Substance Abuse:   What has been your use of drugs/alcohol within the last 12 months?: Pt reports "daily use of alcohol, a 6-pack".  Pt reports that he has been doing this for the past 8 months.  Pt reports that he starts drinking in the morning.  Pt reports "I started drinking at 12". If attempted suicide, did drugs/alcohol play a role in this?: No Alcohol/Substance Abuse Treatment Hx: Denies past history Has alcohol/substance abuse ever caused legal problems?: No(Pt reports that he does have court "[place] for a misdemeanor, misuse  of 911".)  Social Support System:   Patient's Community Support System: Fair Biomedical engineer Support System: Pt reports "Recruitment consultant".  Pt reports that this is a peer and cousin, respectively. Type of faith/religion: Pt denies.  Leisure/Recreation:   Leisure and Hobbies: Pt reports "nothing much, sleeping".  Strengths/Needs:   What is the patient's perception of their strengths?: Pt reports "do everything you possibly can". Patient states they can use these personal strengths during their treatment to contribute to their recovery: Pt reports "just telling the facts". Patient states these barriers may affect/interfere with their treatment: Pt denies. Patient states these barriers may affect their return to the community: Pt denies.  Discharge Plan:   Currently receiving community mental health services: Yes (From Whom)([facility]) Patient states concerns and preferences for aftercare planning are: Pt reports that he wants inpatient treatment. Patient states they will know when they are safe and ready for discharge when: Pt reports "I'm ready now." Does patient have access to transportation?: Yes Does patient have financial barriers related to discharge medications?: No Will patient be returning to same living situation after discharge?: Yes  Summary/Recommendations:   Summary and Recommendations (to be completed by the evaluator): Patient is a 58 year old divorced male [facility] [place], [place] [facility]  He reports that he is on disability and has [employer].  He presents to the hospital following reports of auditory hallucinations.  He is requesting inpatient treatment referral for alcohol use.  He has a primary diagnosis of Major Depressive Disorder, severe, with psychosis.  Recommendations include: crisis stabilization, therapeutic milieu, encourage group attendance and participation, medication management for detox/mood stabilization and development of comprehensive mental wellness/sobriety plan.  Rozann Lesches. 08/28/2018

## 2018-08-28 NOTE — Progress Notes (Signed)
Recreation Therapy Notes    Date: 08/28/2018   Time: 9:30 am   Location: Craft room   Behavioral response: N/A   Intervention Topic: Anger Management   Discussion/Intervention: Patient did not attend group.   Clinical Observations/Feedback:  Patient did not attend group.   Alexi Geibel LRT/CTRS          Shanira Tine 08/28/2018 10:29 AM

## 2018-08-28 NOTE — BHH Suicide Risk Assessment (Signed)
[place] INPATIENT:  Family/Significant Other Suicide Prevention Education  Suicide Prevention Education:  Contact Attempts: Tristan Cook, friend, [phone] has been identified by the patient as the family member/significant other with whom the patient will be residing, and identified as the person(s) who will aid the patient in the event of a mental health crisis.  With written consent from the patient, two attempts were made to provide suicide prevention education, prior to and/or following the patient's discharge.  We were unsuccessful in providing suicide prevention education.  A suicide education pamphlet was given to the patient to share with family/significant other.  Date and time of first attempt: 08/28/2018 at 9:49pm Date and time of second attempt: Second attempt is needed.  Tristan Cook 08/28/2018, 9:49 AM

## 2018-08-28 NOTE — [facility] Suicide Risk Assessment (Signed)
[facility] Admission Suicide Risk Assessment   Nursing information obtained from:  Patient Demographic factors:  Male Current Mental Status:  NA Loss Factors:  NA Historical Factors:  NA Risk Reduction Factors:  NA  Total Time spent with patient: 1 hour Principal Problem: MDD (major depressive disorder), recurrent, severe, with psychosis ([place]) Diagnosis:  Principal Problem:   MDD (major depressive disorder), recurrent, severe, with psychosis ([place]) Active Problems:   Essential hypertension   Alcohol abuse  Subjective Data: Patient seen and chart reviewed.  Patient with a history of mood symptoms and alcohol abuse comes into the hospital saying he is feeling depressed.  Evidently at one point made some suicidal statements.  Patient says his chief concern is getting off of alcohol.  He denies any current suicidal thought intent or plan.  He has been having some hallucinations recently.  He is cooperative with treatment planning  Continued Clinical Symptoms:  Alcohol Use Disorder Identification Test Final Score (AUDIT): 29 The "Alcohol Use Disorders Identification Test", Guidelines for Use in Primary Care, Second Edition.  World Pharmacologist [facility]). Score between 0-7:  no or low risk or alcohol related problems. Score between 8-15:  moderate risk of alcohol related problems. Score between 16-19:  high risk of alcohol related problems. Score 20 or above:  warrants further diagnostic evaluation for alcohol dependence and treatment.   CLINICAL FACTORS:   Depression:   Comorbid alcohol abuse/dependence Alcohol/Substance Abuse/Dependencies   Musculoskeletal: Strength & Muscle Tone: within normal limits Gait & Station: unsteady Patient leans: N/A  Psychiatric Specialty Exam: Physical Exam  Nursing note and vitals reviewed. Constitutional: He appears well-developed and well-nourished.  HENT:  Head: Normocephalic and atraumatic.  Eyes: Pupils are equal, round, and reactive to light.  Conjunctivae are normal.  Neck: Normal range of motion.  Cardiovascular: Regular rhythm and normal heart sounds.  Respiratory: Effort normal.  GI: Soft.  Musculoskeletal: Normal range of motion.  Neurological: He is alert. Coordination abnormal.  Skin: Skin is warm and dry.  Psychiatric: Judgment normal. His affect is blunt. His speech is delayed. He is slowed. Thought content is not paranoid. He exhibits a depressed mood. He expresses no homicidal and no suicidal ideation. He exhibits abnormal recent memory.    Review of Systems  Constitutional: Negative.   HENT: Negative.   Eyes: Negative.   Respiratory: Negative.   Cardiovascular: Negative.   Gastrointestinal: Negative.   Musculoskeletal: Negative.   Skin: Negative.   Neurological: Negative.   Psychiatric/Behavioral: Positive for depression, hallucinations, memory loss and substance abuse. Negative for suicidal ideas. The patient is nervous/anxious and has insomnia.     Blood pressure (!) 130/92, pulse (!) 106, temperature 98.2 F (36.8 C), temperature source Oral, resp. rate 16, height 6\' 2"  (1.88 m), weight 81.6 kg, SpO2 100 %.Body mass index is 23.1 kg/m.  General Appearance: Disheveled  Eye Contact:  Fair  Speech:  Slow  Volume:  Decreased  Mood:  Dysphoric  Affect:  Constricted  Thought Process:  Goal Directed  Orientation:  Full (Time, Place, and Person)  Thought Content:  Rumination  Suicidal Thoughts:  No  Homicidal Thoughts:  No  Memory:  Immediate;   Fair Recent;   Fair Remote;   Fair  Judgement:  Fair  Insight:  Fair  Psychomotor Activity:  Decreased  Concentration:  Concentration: Fair  Recall:  [employer] of Knowledge:  Fair  Language:  Fair  Akathisia:  No  Handed:  Right  AIMS (if indicated):     Assets:  Desire for Improvement Housing Resilience Social Support  ADL's:  Impaired  Cognition:  WNL  Sleep:  Number of Hours: 4.3      COGNITIVE FEATURES THAT CONTRIBUTE TO RISK:  Loss of  executive function    SUICIDE RISK:   Minimal: No identifiable suicidal ideation.  Patients presenting with no risk factors but with morbid ruminations; may be classified as minimal risk based on the severity of the depressive symptoms  PLAN OF CARE: Patient admitted to the hospital.  Detox protocol and affect.  15-minute checks employed.  Monitor for any dangerous behavior.  Reassess suicidality as patient detoxes and depression symptoms are treated.  Reassess suicidality and assure safety at discharge  I certify that inpatient services furnished can reasonably be expected to improve the patient's condition.   Alethia Berthold, MD 08/28/2018, 4:33 PM

## 2018-08-28 NOTE — Plan of Care (Signed)
  Problem: Education: Goal: Utilization of techniques to improve thought processes will improve Outcome: Progressing Goal: Knowledge of the prescribed therapeutic regimen will improve Outcome: Progressing  D: Patient has been ambulating with his front wheel walker. Was observed holding the wheel from his walker in his hand. Michela Pitcher he took it off to adjust it. Replaced wheel with assistance. Was seen ambulating without walker and then asked staff to observe his feet moving by themselves. Laid down in bed and then his feet twitched a little under the covers. Denies SI, HI and AVH. CIWA is a zero. Patient resting in bed with eyes closed. A: Continue to monitor for safety. R: Safety maintained.

## 2018-08-28 NOTE — Plan of Care (Signed)
Patient is alert and oriented X 3, denies SI, HI and AVH. Patient states," I am here to get help to stop drinking." Patient has some mild confusion this morning and had to be reoriented to the unit. Patient gait is unsteady and uses a 4 wheel walker to help get around on the unit. Patient is pleasant  although affect is sullen and depressed. Patient verbally contracts for safety on the unit.  Problem: Education: Goal: Knowledge of [place] [facility] Education information/materials will improve Outcome: Progressing   Problem: Safety: Goal: Periods of time without injury will increase Outcome: Progressing   Problem: Education: Goal: Utilization of techniques to improve thought processes will improve Outcome: Progressing Goal: Knowledge of the prescribed therapeutic regimen will improve Outcome: Progressing

## 2018-08-28 NOTE — Tx Team (Addendum)
Interdisciplinary Treatment and Diagnostic Plan Update  08/28/2018 Time of Session: 2:30pm Tristan Cook MRN: [id number]  Principal Diagnosis: <principal problem not specified>  Secondary Diagnoses: Active Problems:   MDD (major depressive disorder), recurrent, severe, with psychosis ([place])   Current Medications:  Current Facility-Administered Medications  Medication Dose Route Frequency Provider Last Rate Last Dose  . acetaminophen (TYLENOL) tablet 650 mg  650 mg Oral Q6H PRN Lavella Hammock, MD      . alum & mag hydroxide-simeth (MAALOX/MYLANTA) 200-200-20 MG/5ML suspension 30 mL  30 mL Oral Q4H PRN Lavella Hammock, MD      . aspirin EC tablet 81 mg  81 mg Oral Daily Lavella Hammock, MD   81 mg at 08/28/18 0837  . calcium-vitamin D (OSCAL WITH D) 500-200 MG-UNIT per tablet 1 tablet  1 tablet Oral Q breakfast Lavella Hammock, MD   1 tablet at 08/28/18 1141  . cholecalciferol (VITAMIN D3) tablet 400 Units  400 Units Oral Daily Lavella Hammock, MD   400 Units at 08/28/18 1141  . FLUoxetine (PROZAC) capsule 20 mg  20 mg Oral Daily Lavella Hammock, MD   20 mg at 08/28/18 [phone]  . folic acid (FOLVITE) tablet 0.5 mg  500 mcg Oral Daily Lavella Hammock, MD   0.5 mg at 08/28/18 1191  . LORazepam (ATIVAN) injection 0-4 mg  0-4 mg Intravenous Q6H Lavella Hammock, MD       Or  . LORazepam (ATIVAN) tablet 0-4 mg  0-4 mg Oral Q6H Lavella Hammock, MD   2 mg at 08/28/18 1143  . [START ON 08/29/2018] LORazepam (ATIVAN) injection 0-4 mg  0-4 mg Intravenous Q12H Lavella Hammock, MD       Or  . Derrill Memo ON 08/29/2018] LORazepam (ATIVAN) tablet 0-4 mg  0-4 mg Oral Q12H Lavella Hammock, MD      . OLANZapine zydis [facility]) disintegrating tablet 10 mg  10 mg Oral Q8H PRN Lavella Hammock, MD       And  . LORazepam (ATIVAN) tablet 1 mg  1 mg Oral PRN Lavella Hammock, MD       And  . ziprasidone (GEODON) injection 20 mg  20 mg Intramuscular PRN Lavella Hammock, MD      . magnesium hydroxide (MILK  OF MAGNESIA) suspension 30 mL  30 mL Oral Daily PRN Lavella Hammock, MD      . meloxicam [facility]) tablet 15 mg  15 mg Oral Daily Lavella Hammock, MD   15 mg at 08/28/18 4782  . metoprolol tartrate (LOPRESSOR) tablet 25 mg  25 mg Oral BID Lavella Hammock, MD   25 mg at 08/28/18 [phone]  . moxifloxacin (VIGAMOX) 0.5 % ophthalmic solution 1 drop  1 drop Both Eyes TID Clapacs, John T, MD      . multivitamin with minerals tablet 1 tablet  1 tablet Oral Daily Lavella Hammock, MD   1 tablet at 08/28/18 [phone]  . prednisoLONE acetate (PRED FORTE) 1 % ophthalmic suspension 1 drop  1 drop Both Eyes QID Clapacs, John T, MD      . QUEtiapine (SEROQUEL) tablet 50 mg  50 mg Oral QHS Lavella Hammock, MD      . thiamine (VITAMIN B-1) tablet 100 mg  100 mg Oral Daily Lavella Hammock, MD   100 mg at 08/28/18 6578   Or  . thiamine (B-1) injection 100 mg  100 mg Intravenous Daily Melba Coon  M, MD      . vitamin B-12 (CYANOCOBALAMIN) tablet 5,000 mcg  5,000 mcg Oral Daily Lavella Hammock, MD   5,000 mcg at 08/28/18 7782   PTA Medications: Medications Prior to Admission  Medication Sig Dispense Refill Last Dose  . aspirin EC 81 MG tablet Take 81 mg by mouth daily.      . calcium-vitamin D (CALCIUM 500/D) 500-200 MG-UNIT tablet Take 1 tablet by mouth daily with breakfast.     . cholecalciferol (VITAMIN D-400) 10 MCG (400 UNIT) TABS tablet Take 400 Units by mouth daily.     . folic acid (FOLVITE) 423 MCG tablet Take 400 mcg by mouth daily.     . meloxicam (MOBIC) 15 MG tablet Take 15 mg by mouth daily.     . metoprolol tartrate (LOPRESSOR) 50 MG tablet Take 25 mg by mouth 2 (two) times daily.     [name] moxifloxacin (VIGAMOX) 0.5 % ophthalmic solution Place 1 drop into both eyes 3 (three) times daily. In operative eye only     . Multiple Vitamin (MULTIVITAMIN WITH MINERALS) TABS tablet Take 1 tablet by mouth daily.     . prednisoLONE acetate (PRED FORTE) 1 % ophthalmic suspension Place 1 drop into both eyes 4 (four)  times daily. In operative eye only     . vitamin B-12 (CYANOCOBALAMIN) 1000 MCG tablet Take 5,000 mcg by mouth daily.        Patient Stressors: Medication change or noncompliance Substance abuse  Patient Strengths: Motivation for treatment/growth Work skills  Treatment Modalities: Medication Management, Group therapy, Case management,  1 to 1 session with clinician, Psychoeducation, Recreational therapy.   Physician Treatment Plan for Primary Diagnosis: <principal problem not specified> Long Term Goal(s):     Short Term Goals:    Medication Management: Evaluate patient's response, side effects, and tolerance of medication regimen.  Therapeutic Interventions: 1 to 1 sessions, Unit Group sessions and Medication administration.  Evaluation of Outcomes: Progressing  Physician Treatment Plan for Secondary Diagnosis: Active Problems:   MDD (major depressive disorder), recurrent, severe, with psychosis ([place])  Long Term Goal(s):     Short Term Goals:       Medication Management: Evaluate patient's response, side effects, and tolerance of medication regimen.  Therapeutic Interventions: 1 to 1 sessions, Unit Group sessions and Medication administration.  Evaluation of Outcomes: Progressing   RN Treatment Plan for Primary Diagnosis: <principal problem not specified> Long Term Goal(s): Knowledge of disease and therapeutic regimen to maintain health will improve  Short Term Goals: Ability to verbalize frustration and anger appropriately will improve, Ability to demonstrate self-control, Ability to participate in decision making will improve, Ability to disclose and discuss suicidal ideas and Ability to identify and develop effective coping behaviors will improve  Medication Management: RN will administer medications as ordered by provider, will assess and evaluate patient's response and provide education to patient for prescribed medication. RN will report any adverse and/or side  effects to prescribing provider.  Therapeutic Interventions: 1 on 1 counseling sessions, Psychoeducation, Medication administration, Evaluate responses to treatment, Monitor vital signs and CBGs as ordered, Perform/monitor CIWA, COWS, AIMS and Fall Risk screenings as ordered, Perform wound care treatments as ordered.  Evaluation of Outcomes: Progressing   LCSW Treatment Plan for Primary Diagnosis: <principal problem not specified> Long Term Goal(s): Safe transition to appropriate next level of care at discharge, Engage patient in therapeutic group addressing interpersonal concerns.  Short Term Goals: Engage patient in aftercare planning with referrals and resources, Increase  social support, Increase ability to appropriately verbalize feelings, Increase emotional regulation and Facilitate patient progression through stages of change regarding substance use diagnoses and concerns  Therapeutic Interventions: Assess for all discharge needs, 1 to 1 time with Social worker, Explore available resources and support systems, Assess for adequacy in community support network, Educate family and significant other(s) on suicide prevention, Complete Psychosocial Assessment, Interpersonal group therapy.  Evaluation of Outcomes: Progressing   Progress in Treatment: Attending groups: No. Participating in groups: No. Taking medication as prescribed: Yes. Toleration medication: Yes. Family/Significant other contact made: Yes, individual(s) contacted:  SPE completed with pt's girlfriend. Patient understands diagnosis: Yes. Discussing patient identified problems/goals with staff: Yes. Medical problems stabilized or resolved: Yes. Denies suicidal/homicidal ideation: Yes. Issues/concerns per patient self-inventory: No. Other: none  New problem(s) identified: No, Describe:  none  New Short Term/Long Term Goal(s): detox, elimination of symptoms of psychosis, medication management for mood stabilization;  elimination of SI thoughts; development of comprehensive mental wellness/sobriety plan.  Patient Goals:  "to get off it [alcohol]"  Discharge Plan or Barriers: Patient wants a referral to [facility] or [place].  Patient reports that he has a home.    Reason for Continuation of Hospitalization: Delusions  Hallucinations Medication stabilization Suicidal ideation Withdrawal symptoms  Estimated Length of Stay: 1-5 days  Recreational Therapy: Patient Stressors: N/A  Patient Goal: Patient will engage in groups without prompting or encouragement from LRT x3 group sessions within 5 recreation therapy group sessions  Attendees: Patient: Tristan Cook 08/28/2018 3:05 PM  Physician: Dr. Weber Cooks, MD 08/28/2018 3:05 PM  Nursing: Polly Cobia, RN 08/28/2018 3:05 PM  RN Care Manager: 08/28/2018 3:05 PM  Social Worker: Assunta Curtis, LCSW 08/28/2018 3:05 PM  Recreational Therapist: Roanna Epley, Reather Converse, LRT 08/28/2018 3:05 PM  Other: Vivianne Spence, LCSW 08/28/2018 3:05 PM  Other:  08/28/2018 3:05 PM  Other: 08/28/2018 3:05 PM    Scribe for Treatment Team: Rozann Lesches, LCSW 08/28/2018 3:05 PM

## 2018-08-28 NOTE — H&P (Signed)
Psychiatric Admission Assessment Adult  Patient Identification: Tristan Cook MRN:  [id number] Date of Evaluation:  08/28/2018 Chief Complaint:  major depression, psychosis Principal Diagnosis: MDD (major depressive disorder), recurrent, severe, with psychosis ([place]) Diagnosis:  Principal Problem:   MDD (major depressive disorder), recurrent, severe, with psychosis ([place]) Active Problems:   Essential hypertension   Alcohol abuse  History of Present Illness: Patient seen and chart reviewed.  Patient presented to the emergency room with depressed mood and alcohol abuse.  His chief complaint to me is "I want to stop it completely".  He is referring to drinking.  Patient was intoxicated on presentation.  He says that he has been drinking heavily although he cannot really quantify the amount probably as much as a case of beer a day.  Denies using other drugs.  In the last few days had started hearing voices and seeing things which frightened him.  Patient says his mood is been down bad and negative but he denies having any suicidal ideation or wish to die.  Denies homicidal ideation.  Patient says he has been taking medication prescribed by his normal primary care doctor.  From what he tells me he got divorced last year and had to move out of his home and has been living independently in an apartment ever since which he finds depressing.  Nevertheless it sounds like he does have a male companion who is staying with him. Associated Signs/Symptoms: Depression Symptoms:  depressed mood, anhedonia, insomnia, psychomotor retardation, difficulty concentrating, hopelessness, (Hypo) Manic Symptoms:  Distractibility, Anxiety Symptoms:  Nothing reported Psychotic Symptoms:  Hallucinations: Auditory Visual PTSD Symptoms: Negative Total Time spent with patient: 1 hour  Past Psychiatric History: Patient says he thinks he has been prescribed antidepressant medicine in the past.  Cannot remember what it  was.  Has no opinion about whether it was helpful.  No previous hospitalizations.  Denies any history of DTs or seizures.  Says he is never been through any kind of substance abuse treatment or really ever tried to stop drinking in the past.  Is the patient at risk to self? Yes.    Has the patient been a risk to self in the past 6 months? No.  Has the patient been a risk to self within the distant past? No.  Is the patient a risk to others? No.  Has the patient been a risk to others in the past 6 months? No.  Has the patient been a risk to others within the distant past? No.   Prior Inpatient Therapy:   Prior Outpatient Therapy:    Alcohol Screening: 1. How often do you have a drink containing alcohol?: 4 or more times a week 2. How many drinks containing alcohol do you have on a typical day when you are drinking?: 7, 8, or 9 3. How often do you have six or more drinks on one occasion?: Weekly AUDIT-C Score: 10 4. How often during the last year have you found that you were not able to stop drinking once you had started?: Weekly 5. How often during the last year have you failed to do what was normally expected from you becasue of drinking?: Weekly 6. How often during the last year have you needed a first drink in the morning to get yourself going after a heavy drinking session?: Weekly 7. How often during the last year have you had a feeling of guilt of remorse after drinking?: Weekly 8. How often during the last year have you been  unable to remember what happened the night before because you had been drinking?: Weekly 9. Have you or someone else been injured as a result of your drinking?: Yes, but not in the last year 10. Has a relative or friend or a doctor or another health worker been concerned about your drinking or suggested you cut down?: Yes, but not in the last year Alcohol Use Disorder Identification Test Final Score (AUDIT): 29 Alcohol Brief Interventions/Follow-up: Alcohol  Education Substance Abuse History in the last 12 months:  Yes.   Consequences of Substance Abuse: Medical Consequences:  Probably contributing to his neuropathy Previous Psychotropic Medications: No  Psychological Evaluations: No  Past Medical History:  Past Medical History:  Diagnosis Date  . Abdominal pain   . Adenomatous colon polyp 2008  . Arthritis   . Back pain   . Bilateral lumbar radiculopathy   . Blurred vision, bilateral   . Chronic back pain   . Constipation   . Depression   . GERD (gastroesophageal reflux disease)   . Gout   . Hemorrhoids   . High cholesterol   . Hypertension   . Neck pain   . Neuropathy   . Neuropathy of both feet   . Numbness    rectal  . Numbness of legs   . Stroke ([place]) 2019  . Substance abuse ([place])    h/o excessive alcohol use; pt says he limits use to one to 2 beers daily he currently    Past Surgical History:  Procedure Laterality Date  . CATARACT EXTRACTION W/PHACO Right 08/16/2018   Procedure: CATARACT EXTRACTION PHACO AND INTRAOCULAR LENS PLACEMENT RIGHT EYE;  Surgeon: Baruch Goldmann, MD;  Location: [facility];  Service: Ophthalmology;  Laterality: Right;  CDE: 4.18  . COLONOSCOPY  10/09/2006   3 mm pedunculated sigmoid colon polyp removed/8-mm sessile hepatic flexure polyp (tubular villous adenoma) removed/6-mm  descending colon polyp removed/small internal hemorrhoids  . COLONOSCOPY  02/28/2011   VHQ:IONGEX, multiple in the rectum/Internal hemorrhoids, MODERATE-CAUSING RECTAL BLEEDING  . COLONOSCOPY N/A 07/05/2016   Procedure: COLONOSCOPY;  Surgeon: [name], MD;  Location: [facility];  Service: Endoscopy;  Laterality: N/A;  11:15am  . 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 Mild erosive gastrtitis most likely due to indomethacin   . FINGER SURGERY Right    4th and 5th digit  . FLEXIBLE SIGMOIDOSCOPY  01/02/2012   SLF: 1. the colonic mucosa appeared normal in the  sigmoid colon 2. Large internal hemorrhoids: cause for rectal bleeding/pain . s/p banding X 3   . HAND SURGERY    . REVERSE SHOULDER ARTHROPLASTY Left 05/29/2018   Procedure: REVERSE SHOULDER ARTHROPLASTY;  Surgeon: Hiram Gash, MD;  Location: [facility];  Service: Orthopedics;  Laterality: Left;  . SAVORY DILATION N/A 07/21/2014   Procedure: SAVORY DILATION;  Surgeon: [name], MD;  Location[facility];  Service: Endoscopy;  Laterality: N/A;   Family History:  Family History  Problem Relation Age of Onset  . Hypertension Mother   . Hypertension Father   . Stroke Father   . Hypertension Sister   . Hypertension Brother   . Cancer Brother   . Cancer Brother        throat cancer  . Hypertension Brother   . Diabetes Maternal Grandmother   . Colon cancer Neg Hx    Family Psychiatric  History: Patient says there were many people in his family with alcohol problems Tobacco Screening: Have  you used any form of tobacco in the last 30 days? (Cigarettes, Smokeless Tobacco, Cigars, and/or Pipes): Yes Tobacco use, Select all that apply: smokeless tobacco use daily Are you interested in Tobacco Cessation Medications?: Yes, will notify MD for an order Counseled patient on smoking cessation including recognizing danger situations, developing coping skills and basic information about quitting provided: Yes Social History:  Social History   Substance and Sexual Activity  Alcohol Use Yes  . Alcohol/week: 3.0 standard drinks  . Types: 3 Cans of beer per week   Comment: drinks 6 pack of beer on weekends     Social History   Substance and Sexual Activity  Drug Use No    Additional Social History: Marital status: Divorced Divorced, when?: 2019 What types of issues is patient dealing with in the relationship?: Pt reports "her two kids and her not wanting to do any work in raising them." Are you sexually active?: No What is your sexual orientation?: Heterosexual Has your sexual activity  been affected by drugs, alcohol, medication, or emotional stress?: Pt denies. Does patient have children?: No                         Allergies:   Allergies  Allergen Reactions  . Hydrocodone     hallucinations from hydrocodone  . Benadryl [Diphenhydramine Hcl (Sleep)] Other (See Comments)    Palpitations   Lab Results:  Results for orders placed or performed during the hospital encounter of 08/27/18 (from the past 48 hour(s))  Comprehensive metabolic panel     Status: Abnormal   Collection Time: 08/27/18 12:40 AM  Result Value Ref Range   Sodium 131 (L) 135 - 145 mmol/L   Potassium 3.3 (L) 3.5 - 5.1 mmol/L   Chloride 98 98 - 111 mmol/L   CO2 20 (L) 22 - 32 mmol/L   Glucose, Bld 98 70 - 99 mg/dL   BUN <5 (L) 6 - 20 mg/dL   Creatinine, Ser 0.88 0.61 - 1.24 mg/dL   Calcium 8.7 (L) 8.9 - 10.3 mg/dL   Total Protein 7.8 6.5 - 8.1 g/dL   Albumin 3.5 3.5 - 5.0 g/dL   AST 78 (H) 15 - 41 U/L   ALT 30 0 - 44 U/L   Alkaline Phosphatase 95 38 - 126 U/L   Total Bilirubin 0.8 0.3 - 1.2 mg/dL   GFR calc non Af Amer >60 >60 mL/min   GFR calc Af Amer >60 >60 mL/min   Anion gap 13 5 - 15    Comment: Performed at [facility], [address] [place], [place] [address]  Ethanol     Status: Abnormal   Collection Time: 08/27/18 12:40 AM  Result Value Ref Range   Alcohol, Ethyl (B) 208 (H) <10 mg/dL    Comment: (NOTE) Lowest detectable limit for serum alcohol is 10 mg/dL. For medical purposes only. Performed at [facility], [address] [place], [place] [address]   Salicylate level     Status: None   Collection Time: 08/27/18 12:40 AM  Result Value Ref Range   Salicylate Lvl <5.8 2.8 - 30.0 mg/dL    Comment: Performed at [facility], [address] [place], [place] [address]  Acetaminophen level     Status: Abnormal   Collection Time: 08/27/18 12:40 AM  Result Value Ref Range   Acetaminophen (Tylenol), Serum <10 (L) 10 - 30 ug/mL     Comment: (NOTE) Therapeutic concentrations vary significantly.  A range of 10-30 ug/mL  may be an effective concentration for many patients. However, some  are best treated at concentrations outside of this range. Acetaminophen concentrations >150 ug/mL at 4 hours after ingestion  and >50 ug/mL at 12 hours after ingestion are often associated with  toxic reactions. Performed at [facility], [address] [place], [place] [address]   cbc     Status: Abnormal   Collection Time: 08/27/18 12:40 AM  Result Value Ref Range   WBC 4.4 4.0 - 10.5 K/uL   RBC 4.54 4.22 - 5.81 MIL/uL   Hemoglobin 13.8 13.0 - 17.0 g/dL   HCT 37.7 (L) 39.0 - 52.0 %   MCV 83.0 80.0 - 100.0 fL   MCH 30.4 26.0 - 34.0 pg   MCHC 36.6 (H) 30.0 - 36.0 g/dL   RDW 13.8 11.5 - 15.5 %   Platelets 214 150 - 400 K/uL   nRBC 0.0 0.0 - 0.2 %    Comment: Performed at [facility], [address] [place], [place] [address]  Urine Drug Screen, Qualitative     Status: None   Collection Time: 08/27/18 12:40 AM  Result Value Ref Range   Tricyclic, Ur Screen NONE DETECTED NONE DETECTED   Amphetamines, Ur Screen NONE DETECTED NONE DETECTED   MDMA (Ecstasy)Ur Screen NONE DETECTED NONE DETECTED   Cocaine Metabolite,Ur [place] NONE DETECTED NONE DETECTED   Opiate, Ur Screen NONE DETECTED NONE DETECTED   Phencyclidine (PCP) Ur S NONE DETECTED NONE DETECTED   Cannabinoid 50 Ng, Ur [place] NONE DETECTED NONE DETECTED   Barbiturates, Ur Screen NONE DETECTED NONE DETECTED   Benzodiazepine, Ur Scrn NONE DETECTED NONE DETECTED   Methadone Scn, Ur NONE DETECTED NONE DETECTED    Comment: (NOTE) Tricyclics + metabolites, urine    Cutoff 1000 ng/mL Amphetamines + metabolites, urine  Cutoff 1000 ng/mL MDMA (Ecstasy), urine              Cutoff 500 ng/mL Cocaine Metabolite, urine          Cutoff 300 ng/mL Opiate + metabolites, urine        Cutoff 300 ng/mL Phencyclidine (PCP), urine         Cutoff 25 ng/mL Cannabinoid, urine                  Cutoff 50 ng/mL Barbiturates + metabolites, urine  Cutoff 200 ng/mL Benzodiazepine, urine              Cutoff 200 ng/mL Methadone, urine                   Cutoff 300 ng/mL The urine drug screen provides only a preliminary, unconfirmed analytical test result and should not be used for non-medical purposes. Clinical consideration and professional judgment should be applied to any positive drug screen result due to possible interfering substances. A more specific alternate chemical method must be used in order to obtain a confirmed analytical result. Gas chromatography / mass spectrometry (GC/MS) is the preferred confirmat ory method. Performed at [facility], [address] [place], [place] [address]   Lipid panel     Status: Abnormal   Collection Time: 08/27/18 12:40 AM  Result Value Ref Range   Cholesterol 438 (H) 0 - 200 mg/dL   Triglycerides 855 (H) <150 mg/dL   HDL 47 >40 mg/dL   Total CHOL/HDL Ratio 9.3 RATIO   VLDL UNABLE TO CALCULATE IF TRIGLYCERIDE OVER 400 mg/dL 0 - 40 mg/dL  LDL Cholesterol UNABLE TO CALCULATE IF TRIGLYCERIDE OVER 400 mg/dL 0 - 99 mg/dL    Comment:        Total Cholesterol/HDL:CHD Risk Coronary Heart Disease Risk Table                     Men   Women  1/2 Average Risk   3.4   3.3  Average Risk       5.0   4.4  2 X Average Risk   9.6   7.1  3 X Average Risk  23.4   11.0        Use the calculated Patient Ratio above and the CHD Risk Table to determine the patient's CHD Risk.        ATP III CLASSIFICATION (LDL):  <100     mg/dL   Optimal  100-129  mg/dL   Near or Above                    Optimal  130-159  mg/dL   Borderline  160-189  mg/dL   High  >190     mg/dL   Very High Performed at [facility], [address] [place], [place] [address]   Hemoglobin A1c     Status: None   Collection Time: 08/27/18 12:40 AM  Result Value Ref Range   Hgb A1c MFr Bld 5.4 4.8 - 5.6 %    Comment: (NOTE) Pre diabetes:           5.7%-6.4% Diabetes:              >6.4% Glycemic control for   <7.0% adults with diabetes    Mean Plasma Glucose 108.28 mg/dL    Comment: Performed at [place] [address] [place], [place] [address]  TSH     Status: None   Collection Time: 08/27/18 12:40 AM  Result Value Ref Range   TSH 1.112 0.350 - 4.500 uIU/mL    Comment: Performed by a 3rd Generation assay with a functional sensitivity of <=0.01 uIU/mL. Performed at [facility], [address] [place], [place] [address]   LDL cholesterol, direct     Status: None   Collection Time: 08/27/18 12:40 AM  Result Value Ref Range   Direct LDL 94.9 0 - 99 mg/dL    Comment: Performed at [place] [address] [place], [place] [address]  SARS Coronavirus 2 (CEPHEID - Performed in [place] hospital lab), Hosp Order     Status: None   Collection Time: 08/27/18  3:15 PM   Specimen: Nasopharyngeal Swab  Result Value Ref Range   SARS Coronavirus 2 NEGATIVE NEGATIVE    Comment: (NOTE) If result is NEGATIVE SARS-CoV-2 target nucleic acids are NOT DETECTED. The SARS-CoV-2 RNA is generally detectable in upper and lower  respiratory specimens during the acute phase of infection. The lowest  concentration of SARS-CoV-2 viral copies this assay can detect is 250  copies / mL. A negative result does not preclude SARS-CoV-2 infection  and should not be used as the sole basis for treatment or other  patient management decisions.  A negative result may occur with  improper specimen collection / handling, submission of specimen other  than nasopharyngeal swab, presence of viral mutation(s) within the  areas targeted by this assay, and inadequate number of viral copies  (<250 copies / mL). A negative result must be combined with clinical  observations, patient history, and epidemiological  information. If result is POSITIVE SARS-CoV-2 target nucleic acids are DETECTED. The SARS-CoV-2 RNA is generally detectable  in upper and lower  respiratory specimens dur ing the acute phase of infection.  Positive  results are indicative of active infection with SARS-CoV-2.  Clinical  correlation with patient history and other diagnostic information is  necessary to determine patient infection status.  Positive results do  not rule out bacterial infection or co-infection with other viruses. If result is PRESUMPTIVE POSTIVE SARS-CoV-2 nucleic acids MAY BE PRESENT.   A presumptive positive result was obtained on the submitted specimen  and confirmed on repeat testing.  While 2019 novel coronavirus  (SARS-CoV-2) nucleic acids may be present in the submitted sample  additional confirmatory testing may be necessary for epidemiological  and / or clinical management purposes  to differentiate between  SARS-CoV-2 and other Sarbecovirus currently known to infect humans.  If clinically indicated additional testing with an alternate test  methodology [phone]) is advised. The SARS-CoV-2 RNA is generally  detectable in upper and lower respiratory sp ecimens during the acute  phase of infection. The expected result is Negative. Fact Sheet for Patients:  StrictlyIdeas.no Fact Sheet for Healthcare Providers: BankingDealers.co.za This test is not yet approved or cleared by the [place] FDA and has been authorized for detection and/or diagnosis of SARS-CoV-2 by FDA under an Emergency Use Authorization (EUA).  This EUA will remain in effect (meaning this test can be used) for the duration of the COVID-19 declaration under Section 564(b)(1) of the Act, 21 U.S.C. section 360bbb-3(b)(1), unless the authorization is terminated or revoked sooner. Performed at [facility], [address] [place], [place] [address]     Blood Alcohol level:  Lab Results  Component Value Date   ETH 208 (H) 08/27/2018   ETH <10 81/19/1478    Metabolic Disorder Labs:  Lab  Results  Component Value Date   HGBA1C 5.4 08/27/2018   MPG 108.28 08/27/2018   MPG 100 10/02/2016   No results found for: PROLACTIN Lab Results  Component Value Date   CHOL 438 (H) 08/27/2018   TRIG 855 (H) 08/27/2018   HDL 47 08/27/2018   CHOLHDL 9.3 08/27/2018   VLDL UNABLE TO CALCULATE IF TRIGLYCERIDE OVER 400 mg/dL 08/27/2018   LDLCALC UNABLE TO CALCULATE IF TRIGLYCERIDE OVER 400 mg/dL 08/27/2018   LDLCALC 87 03/03/2016    Current Medications: Current Facility-Administered Medications  Medication Dose Route Frequency Provider Last Rate Last Dose  . acetaminophen (TYLENOL) tablet 650 mg  650 mg Oral Q6H PRN Lavella Hammock, MD      . alum & mag hydroxide-simeth (MAALOX/MYLANTA) 200-200-20 MG/5ML suspension 30 mL  30 mL Oral Q4H PRN Lavella Hammock, MD      . aspirin EC tablet 81 mg  81 mg Oral Daily Lavella Hammock, MD   81 mg at 08/28/18 0837  . calcium-vitamin D (OSCAL WITH D) 500-200 MG-UNIT per tablet 1 tablet  1 tablet Oral Q breakfast Lavella Hammock, MD   1 tablet at 08/28/18 1141  . cholecalciferol (VITAMIN D3) tablet 400 Units  400 Units Oral Daily Lavella Hammock, MD   400 Units at 08/28/18 1141  . FLUoxetine (PROZAC) capsule 20 mg  20 mg Oral Daily Lavella Hammock, MD   20 mg at 08/28/18 [phone]  . folic acid (FOLVITE) tablet 0.5 mg  500 mcg Oral Daily Lavella Hammock, MD   0.5 mg at 08/28/18 2130  . LORazepam (ATIVAN) injection 0-4 mg  0-4 mg Intravenous Q6H Lavella Hammock, MD       Or  . LORazepam (ATIVAN) tablet 0-4 mg  0-4 mg Oral Q6H Lavella Hammock, MD   2 mg at 08/28/18 1143  . [START ON 08/29/2018] LORazepam (ATIVAN) injection 0-4 mg  0-4 mg Intravenous Q12H Lavella Hammock, MD       Or  . Derrill Memo ON 08/29/2018] LORazepam (ATIVAN) tablet 0-4 mg  0-4 mg Oral Q12H Lavella Hammock, MD      . OLANZapine zydis [facility]) disintegrating tablet 10 mg  10 mg Oral Q8H PRN Lavella Hammock, MD       And  . LORazepam (ATIVAN) tablet 1 mg  1 mg Oral PRN Lavella Hammock, MD       And  . ziprasidone (GEODON) injection 20 mg  20 mg Intramuscular PRN Lavella Hammock, MD      . magnesium hydroxide (MILK OF MAGNESIA) suspension 30 mL  30 mL Oral Daily PRN Lavella Hammock, MD      . meloxicam [facility]) tablet 15 mg  15 mg Oral Daily Lavella Hammock, MD   15 mg at 08/28/18 7741  . metoprolol tartrate (LOPRESSOR) tablet 25 mg  25 mg Oral BID Lavella Hammock, MD   25 mg at 08/28/18 [phone]  . moxifloxacin (VIGAMOX) 0.5 % ophthalmic solution 1 drop  1 drop Both Eyes TID Julion Gatt T, MD      . multivitamin with minerals tablet 1 tablet  1 tablet Oral Daily Lavella Hammock, MD   1 tablet at 08/28/18 [phone]  . prednisoLONE acetate (PRED FORTE) 1 % ophthalmic suspension 1 drop  1 drop Both Eyes QID Jullian Clayson T, MD      . QUEtiapine (SEROQUEL) tablet 50 mg  50 mg Oral QHS Lavella Hammock, MD      . thiamine (VITAMIN B-1) tablet 100 mg  100 mg Oral Daily Lavella Hammock, MD   100 mg at 08/28/18 2094   Or  . thiamine (B-1) injection 100 mg  100 mg Intravenous Daily Lavella Hammock, MD      . vitamin B-12 (CYANOCOBALAMIN) tablet 5,000 mcg  5,000 mcg Oral Daily Lavella Hammock, MD   5,000 mcg at 08/28/18 7096   PTA Medications: Medications Prior to Admission  Medication Sig Dispense Refill Last Dose  . aspirin EC 81 MG tablet Take 81 mg by mouth daily.      . calcium-vitamin D (CALCIUM 500/D) 500-200 MG-UNIT tablet Take 1 tablet by mouth daily with breakfast.     . cholecalciferol (VITAMIN D-400) 10 MCG (400 UNIT) TABS tablet Take 400 Units by mouth daily.     . folic acid (FOLVITE) 283 MCG tablet Take 400 mcg by mouth daily.     . meloxicam (MOBIC) 15 MG tablet Take 15 mg by mouth daily.     . metoprolol tartrate (LOPRESSOR) 50 MG tablet Take 25 mg by mouth 2 (two) times daily.     [name] moxifloxacin (VIGAMOX) 0.5 % ophthalmic solution Place 1 drop into both eyes 3 (three) times daily. In operative eye only     . Multiple Vitamin (MULTIVITAMIN WITH MINERALS)  TABS tablet Take 1 tablet by mouth daily.     . prednisoLONE acetate (PRED FORTE) 1 % ophthalmic suspension Place 1 drop into both eyes 4 (four) times daily. In operative eye only     . vitamin B-12 (CYANOCOBALAMIN) 1000 MCG tablet Take 5,000 mcg  by mouth daily.        Musculoskeletal: Strength & Muscle Tone: decreased Gait & Station: unsteady Patient leans: N/A  Psychiatric Specialty Exam: Physical Exam  Nursing note and vitals reviewed. Constitutional: He appears well-developed and well-nourished.  HENT:  Head: Normocephalic and atraumatic.  Eyes: Pupils are equal, round, and reactive to light. Conjunctivae are normal.  Neck: Normal range of motion.  Cardiovascular: Regular rhythm and normal heart sounds.  Respiratory: Effort normal.  GI: Soft.  Musculoskeletal: Normal range of motion.  Neurological: He is alert. Coordination abnormal.  Skin: Skin is warm and dry.  Psychiatric: His affect is blunt. His speech is delayed. He expresses impulsivity. He exhibits a depressed mood. He expresses no homicidal and no suicidal ideation. He exhibits abnormal recent memory.    Review of Systems  Constitutional: Negative.   HENT: Negative.   Eyes: Negative.   Respiratory: Negative.   Cardiovascular: Negative.   Gastrointestinal: Negative.   Musculoskeletal: Negative.   Skin: Negative.   Neurological: Negative.   Psychiatric/Behavioral: Positive for depression, hallucinations, memory loss and substance abuse. Negative for suicidal ideas. The patient is nervous/anxious and has insomnia.     Blood pressure (!) 130/92, pulse (!) 106, temperature 98.2 F (36.8 C), temperature source Oral, resp. rate 16, height 6\' 2"  (1.88 m), weight 81.6 kg, SpO2 100 %.Body mass index is 23.1 kg/m.  General Appearance: Disheveled  Eye Contact:  Fair  Speech:  Slow  Volume:  Decreased  Mood:  Depressed  Affect:  Constricted  Thought Process:  Coherent  Orientation:  Full (Time, Place, and Person)   Thought Content:  Logical and Hallucinations: Auditory Visual  Suicidal Thoughts:  No  Homicidal Thoughts:  No  Memory:  Immediate;   Fair Recent;   Fair Remote;   Fair  Judgement:  Fair  Insight:  Fair  Psychomotor Activity:  Decreased  Concentration:  Concentration: Poor  Recall:  [employer] of Knowledge:  Fair  Language:  Fair  Akathisia:  No  Handed:  Right  AIMS (if indicated):     Assets:  Desire for Improvement Housing  ADL's:  Impaired  Cognition:  Impaired,  Mild  Sleep:  Number of Hours: 4.3    Treatment Plan Summary: Daily contact with patient to assess and evaluate symptoms and progress in treatment, Medication management and Plan Patient will be on detox protocol.  No clear evidence that he needs to start antidepressant medicine.  Engage in individual and group therapy.  Psychoeducation daily.  Patient is expressing a desire to go to substance abuse rehab and we will work on referral.  Also he came in with hypokalemia today which was corrected with a dose of potassium and we will recheck that tomorrow.  Observation Level/Precautions:  15 minute checks  Laboratory:  Chemistry Profile  Psychotherapy:    Medications:    Consultations:    Discharge Concerns:    Estimated LOS:  Other:     Physician Treatment Plan for Primary Diagnosis: MDD (major depressive disorder), recurrent, severe, with psychosis ([place]) Long Term Goal(s): Improvement in symptoms so as ready for discharge  Short Term Goals: Ability to demonstrate self-control will improve and Ability to identify and develop effective coping behaviors will improve  Physician Treatment Plan for Secondary Diagnosis: Principal Problem:   MDD (major depressive disorder), recurrent, severe, with psychosis ([place]) Active Problems:   Essential hypertension   Alcohol abuse  Long Term Goal(s): Improvement in symptoms so as ready for discharge  Short Term Goals:  Ability to identify triggers associated with substance  abuse/mental health issues will improve  I certify that inpatient services furnished can reasonably be expected to improve the patient's condition.    Alethia Berthold, MD 6/17/20204:36 PM

## 2018-08-28 NOTE — Progress Notes (Addendum)
Admission Note:  58 yr male who presents voluntary commitment in no acute distress for the treatment of SI and Substance Abuse. Patient appears flat and depressed, no distress noted, he is  cooperative with admission process. Patient denies SI/HI/AVH and contracts for safety upon admission. Patient thoughts are organized and coherent, he stated he was drinking heavily and he needed help with alcohol issues. Patient  has Past medical Hx of HTN, Major Depression, GERD, Alcoholism, Neuropathy and Insomnia. Patient's skin was assessed in presence of Rodman Key RN  and found to be warm, dry and intact, some old surgical scar noted to his left shoulder and trunk area. Patient was searched and no contraband found, POC and unit policies explained,  understanding verbalized and  consents obtained. Patient was oriented to the unit and food and fluids offered and both accepted. 15 minutes safety checks maintained will continue to monitor.

## 2018-08-28 NOTE — [facility] Suicide Risk Assessment (Signed)
[facility] INPATIENT:  Family/Significant Other Suicide Prevention Education  Suicide Prevention Education:  Education Completed; Allen Norris, friend, [phone] has been identified by the patient as the family member/significant other with whom the patient will be residing, and identified as the person(s) who will aid the patient in the event of a mental health crisis (suicidal ideations/suicide attempt).  With written consent from the patient, the family member/significant other has been provided the following suicide prevention education, prior to the and/or following the discharge of the patient.  The suicide prevention education provided includes the following:  Suicide risk factors  Suicide prevention and interventions  National Suicide Hotline telephone number  [facility] assessment telephone number  [facility] Emergency Assistance [place] and/or Residential Mobile Crisis Unit telephone number  Request made of family/significant other to:  Remove weapons (e.g., guns, rifles, knives), all items previously/currently identified as safety concern.    Remove drugs/medications (over-the-counter, prescriptions, illicit drugs), all items previously/currently identified as a safety concern.  The family member/significant other verbalizes understanding of the suicide prevention education information provided.  The family member/significant other agrees to remove the items of safety concern listed above.  Friend reports that the patient informed her he was hearing voices and she took him to the hospital.  She reports that the patient reports that "he can see and talk to the voices through the walls and because of that he kept calling 911 and now has a charge for misuse of 911 because they would come out there and there wouldn't be anything going on."   She reports that "he hasn't been in his apartment since May 23, he's been staying at hotels.  The doctor said  it was likely that he that he was trying to stop drinking and was seeing things.  He has court Aug 5th."  She reports that she believes that he still has his apartment and that he sent in the June rent, but she will verify that on Saturday.  CSW asked for clarification because per the patient she is his therapist.  She reports that she works for [employer] for the [employer] but is not a therapist or his therapist.  She reports that patient had 4 siblings, 2 of which have passed away from alcohol related illnesses, this is different than what the patient has told me.  She does reports that the patient does have a firearm at the apartment and can have access to it.  Girlfriend reports that she will remove the firearm from the home.  Girlfriend reports that the pt can not stay with her because she has children and grandchildren.  She reports "I think he has depression because he was diagnosed with neuropathy, lost his job, lost his house and was homeless for some time."  Rozann Lesches 08/28/2018, 11:32 AM

## 2018-08-28 NOTE — Progress Notes (Signed)
Recreation Therapy Notes  INPATIENT RECREATION THERAPY ASSESSMENT  Patient Details Name: Tristan Cook MRN: [id number] DOB: [birth year] Today's Date: 08/28/2018       Information Obtained From: Patient  Able to Participate in Assessment/Interview: Yes  Patient Presentation: Responsive  Reason for Admission (Per Patient): Active Symptoms, Substance Abuse  Patient Stressors:    Coping Skills:   Substance Abuse, Other (Comment)(Sleep)  Leisure Interests (2+):  Community - Conservator, museum/gallery for a ride, [place])  Frequency of Recreation/Participation: Monthly  Awareness of Community Resources:     [employer]:     Current Use:    If no, Barriers?:    Expressed Interest in [place] of Residence:  [employer]  Patient Main Form of Transportation:    Patient Strengths:  Smiling  Patient Identified Areas of Improvement:  Stop drinking  Patient Goal for Hospitalization:  To meet my expectations  Current SI (including self-harm):  No  Current HI:  No  Current AVH: No  Staff Intervention Plan: Group Attendance, Collaborate with Interdisciplinary Treatment Team  Consent to Intern Participation: N/A  Sharda Keddy 08/28/2018, 1:39 PM

## 2018-08-28 NOTE — Progress Notes (Signed)
D: Patient has been ambulating with his front wheel walker. Was observed holding the wheel from his walker in his hand. Michela Pitcher he took it off to adjust it. Replaced wheel with assistance. Was seen ambulating without walker and then asked staff to observe his feet moving by themselves. Laid down in bed and then his feet twitched a little under the covers. Denies SI, HI and AVH. CIWA is a zero. Patient resting in bed with eyes closed. A: Continue to monitor for safety. R: Safety maintained.

## 2018-08-28 NOTE — [facility] Group Notes (Signed)
LCSW Group Therapy Note  08/28/2018 1:00 PM  Type of Therapy/Topic:  Group Therapy:  Emotion Regulation  Participation Level:  Did Not Attend   Description of Group:   The purpose of this group is to assist patients in learning to regulate negative emotions and experience positive emotions. Patients will be guided to discuss ways in which they have been vulnerable to their negative emotions. These vulnerabilities will be juxtaposed with experiences of positive emotions or situations, and patients will be challenged to use positive emotions to combat negative ones. Special emphasis will be placed on coping with negative emotions in conflict situations, and patients will process healthy conflict resolution skills.  Therapeutic Goals: 1. Patient will identify two positive emotions or experiences to reflect on in order to balance out negative emotions 2. Patient will label two or more emotions that they find the most difficult to experience 3. Patient will demonstrate positive conflict resolution skills through discussion and/or role plays  Summary of Patient Progress: X  Therapeutic Modalities:   Cognitive Behavioral Therapy Feelings Identification Dialectical Behavioral Therapy  Assunta Curtis, MSW, LCSW 08/28/2018 12:42 PM

## 2018-08-28 NOTE — Plan of Care (Signed)
Patient newly admitted, didn't have time to progress.   Problem: Education: Goal: Knowledge of [place] [facility] Education information/materials will improve Outcome: Not Progressing   Problem: Safety: Goal: Periods of time without injury will increase Outcome: Not Progressing   Problem: Education: Goal: Utilization of techniques to improve thought processes will improve Outcome: Not Progressing Goal: Knowledge of the prescribed therapeutic regimen will improve Outcome: Not Progressing   Problem: Safety: Goal: Ability to disclose and discuss suicidal ideas will improve Outcome: Not Progressing Goal: Ability to identify and utilize support systems that promote safety will improve Outcome: Not Progressing

## 2018-08-29 LAB — BASIC METABOLIC PANEL
Anion gap: 10 (ref 5–15)
BUN: 6 mg/dL (ref 6–20)
CO2: 26 mmol/L (ref 22–32)
Calcium: 9.6 mg/dL (ref 8.9–10.3)
Chloride: 101 mmol/L (ref 98–111)
Creatinine, Ser: 1.21 mg/dL (ref 0.61–1.24)
GFR calc Af Amer: >60 mL/min (ref >60)
GFR calc non Af Amer: >60 mL/min (ref >60)
Glucose, Bld: 98 mg/dL (ref 70–99)
Potassium: 3.2 mmol/L — ABNORMAL LOW (ref 3.5–5.1)
Sodium: 137 mmol/L (ref 135–145)

## 2018-08-29 MED ORDER — QUETIAPINE FUMARATE 100 MG PO TABS
100.0000 mg | ORAL_TABLET | Freq: Every day | ORAL | Status: DC
  Administered 2018-08-29 – 2018-09-04 (×7): 100 mg via ORAL
  Filled 2018-08-29 (×7): qty 1

## 2018-08-29 MED ORDER — POTASSIUM CHLORIDE CRYS ER 20 MEQ PO TBCR
20.0000 meq | EXTENDED_RELEASE_TABLET | Freq: Two times a day (BID) | ORAL | Status: DC
  Administered 2018-08-29 – 2018-09-05 (×14): 20 meq via ORAL
  Filled 2018-08-29 (×14): qty 1

## 2018-08-29 MED ORDER — AMLODIPINE BESYLATE 5 MG PO TABS
5.0000 mg | ORAL_TABLET | Freq: Every day | ORAL | Status: DC
  Administered 2018-08-30 – 2018-09-05 (×7): 5 mg via ORAL
  Filled 2018-08-29 (×7): qty 1

## 2018-08-29 NOTE — [facility] Group Notes (Signed)
LCSW Group Therapy Note  08/29/2018 12:36 PM  Type of Therapy/Topic:  Group Therapy:  Balance in Life  Participation Level:  Active  Description of Group:    This group will address the concept of balance and how it feels and looks when one is unbalanced. Patients will be encouraged to process areas in their lives that are out of balance and identify reasons for remaining unbalanced. Facilitators will guide patients in utilizing problem-solving interventions to address and correct the stressor making their life unbalanced. Understanding and applying boundaries will be explored and addressed for obtaining and maintaining a balanced life. Patients will be encouraged to explore ways to assertively make their unbalanced needs known to significant others in their lives, using other group members and facilitator for support and feedback.  Therapeutic Goals: 1. Patient will identify two or more emotions or situations they have that consume much of in their lives. 2. Patient will identify signs/triggers that life has become out of balance:  3. Patient will identify two ways to set boundaries in order to achieve balance in their lives:  4. Patient will demonstrate ability to communicate their needs through discussion and/or role plays  Summary of Patient Progress: Pt was appropriate and respectful in group. Pt was able to identify ways to maintain balance in his life through the different 8 domains that were discussed. Pt reported that sports are a way to balance his leisure domain as he enjoys sports.      Therapeutic Modalities:   Cognitive Behavioral Therapy Solution-Focused Therapy Assertiveness Training  Evalina Field, MSW, LCSW Clinical Social Work 08/29/2018 12:36 PM

## 2018-08-29 NOTE — Progress Notes (Signed)
Recreation Therapy Notes   Date: 08/29/2018  Time: 9:30 am  Location: Craft room  Behavioral response: Appropriate  Intervention Topic: Communication  Discussion/Intervention:  Group content today was focused on communication. The group defined communication and ways to communicate with others. Individuals stated reason why communication is important and some reasons to communicate with others. Patients expressed if they thought they were good at communicating with others and ways they could improve their communication skills. The group identified important parts of communication and some experiences they have had in the past with communication. The group participated in the intervention "What is that?", where they had a chance to test out their communication skills and identify ways to improve their communication techniques.  Clinical Observations/Feedback:  Patient came to group late and expressed that you can not live without communication.  Individual was social with peers and staff while participating in the intervention. Yezenia Fredrick LRT/CTRS         Madex Seals 08/29/2018 10:59 AM

## 2018-08-29 NOTE — Plan of Care (Signed)
Patient is alert and oriented X 4, denies SI, HI and AVH. Patient expresses gratitude for Mobic medication stating  "That pill really helps with my neuropathy. It helps but then around 3 pm it starts back up again the tingling."  Patient expressed concerns about a doctors appointment on 08/30/2018 to have pressure in his eyes tested. Patient is very pleasant and cooperative. Patient hopeful participating in rehab treatment program. Patient  verbally contracts for safety. Q 15 minute safety checks to continue. Problem: Education: Goal: Knowledge of [place] [facility] Education information/materials will improve Outcome: Progressing   Problem: Safety: Goal: Periods of time without injury will increase Outcome: Progressing   Problem: Education: Goal: Utilization of techniques to improve thought processes will improve Outcome: Progressing Goal: Knowledge of the prescribed therapeutic regimen will improve Outcome: Progressing   Problem: Safety: Goal: Ability to disclose and discuss suicidal ideas will improve Outcome: Progressing Goal: Ability to identify and utilize support systems that promote safety will improve Outcome: Progressing

## 2018-08-29 NOTE — Plan of Care (Signed)
  Problem: Education: Goal: Knowledge of [place] [facility] Education information/materials will improve Note: Instructed  patient on [place] Education , unit programing ,  able to verbalize understanding

## 2018-08-29 NOTE — Progress Notes (Signed)
[facility] MD Progress Note  08/29/2018 6:14 PM Tristan Cook  MRN:  [id number] Subjective: Follow-up for this patient with depression and alcohol abuse.  Patient seen chart reviewed.  Patient tells me he is feeling "great" today.  He says the day is been very good.  He completely denies feeling depressed or suicidal.  Specifically denies any suicidal ideation.  Denies feeling shaky or having any symptoms of withdrawal.  He is still wanting to go to inpatient rehab.  Somewhat to my surprise he also went off on a paranoid tangent.  He told me that the main problem in his life is that his sister is trying to have people kill him.  He says that there is no particular reason for this but because of it he plans to move to [place] as soon as possible.  His interactions with other people here on the unit however seem appropriate.  Attends groups take care of his basic ADLs.  Walking normally. Principal Problem: MDD (major depressive disorder), recurrent, severe, with psychosis ([place]) Diagnosis: Principal Problem:   MDD (major depressive disorder), recurrent, severe, with psychosis ([place]) Active Problems:   Essential hypertension   Alcohol abuse  Total Time spent with patient: 30 minutes  Past Psychiatric History: Patient has no prior treatment history as far as I can see  Past Medical History:  Past Medical History:  Diagnosis Date  . Abdominal pain   . Adenomatous colon polyp 2008  . Arthritis   . Back pain   . Bilateral lumbar radiculopathy   . Blurred vision, bilateral   . Chronic back pain   . Constipation   . Depression   . GERD (gastroesophageal reflux disease)   . Gout   . Hemorrhoids   . High cholesterol   . Hypertension   . Neck pain   . Neuropathy   . Neuropathy of both feet   . Numbness    rectal  . Numbness of legs   . Stroke ([place]) 2019  . Substance abuse ([place])    h/o excessive alcohol use; pt says he limits use to one to 2 beers daily he currently    Past Surgical History:   Procedure Laterality Date  . CATARACT EXTRACTION W/PHACO Right 08/16/2018   Procedure: CATARACT EXTRACTION PHACO AND INTRAOCULAR LENS PLACEMENT RIGHT EYE;  Surgeon: Baruch Goldmann, MD;  Location: [facility];  Service: Ophthalmology;  Laterality: Right;  CDE: 4.18  . COLONOSCOPY  10/09/2006   3 mm pedunculated sigmoid colon polyp removed/8-mm sessile hepatic flexure polyp (tubular villous adenoma) removed/6-mm  descending colon polyp removed/small internal hemorrhoids  . COLONOSCOPY  02/28/2011   HQP:[id number], multiple in the rectum/Internal hemorrhoids, MODERATE-CAUSING RECTAL BLEEDING  . COLONOSCOPY N/A 07/05/2016   Procedure: COLONOSCOPY;  Surgeon: [name], MD;  [facility];  Service: Endoscopy;  Laterality: N/A;  11:15am  . 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 Mild erosive gastrtitis most likely due to indomethacin   . FINGER SURGERY Right    4th and 5th digit  . FLEXIBLE SIGMOIDOSCOPY  01/02/2012   SLF: 1. the colonic mucosa appeared normal in the sigmoid colon 2. Large internal hemorrhoids: cause for rectal bleeding/pain . s/p banding X 3   . HAND SURGERY    . REVERSE SHOULDER ARTHROPLASTY Left 05/29/2018   Procedure: REVERSE SHOULDER ARTHROPLASTY;  Surgeon: Hiram Gash, MD;  Location: [facility];  Service: Orthopedics;  Laterality: Left;  . SAVORY DILATION N/A 07/21/2014   Procedure:  SAVORY DILATION;  Surgeon: [name], MD;  [facility];  Service: Endoscopy;  Laterality: N/A;   Family History:  Family History  Problem Relation Age of Onset  . Hypertension Mother   . Hypertension Father   . Stroke Father   . Hypertension Sister   . Hypertension Brother   . Cancer Brother   . Cancer Brother        throat cancer  . Hypertension Brother   . Diabetes Maternal Grandmother   . Colon cancer Neg Hx    Family Psychiatric  History: See previous Social History:  Social History   Substance and  Sexual Activity  Alcohol Use Yes  . Alcohol/week: 3.0 standard drinks  . Types: 3 Cans of beer per week   Comment: drinks 6 pack of beer on weekends     Social History   Substance and Sexual Activity  Drug Use No    Social History   Socioeconomic History  . Marital status: Single    Spouse name: Not on file  . Number of children: 0  . Years of education: 60  . Highest education level: Not on file  Occupational History  . Occupation: unemployed, Retail buyer: UNEMPLOYED  Social Needs  . Financial resource strain: Not on file  . Food insecurity    Worry: Not on file    Inability: Not on file  . Transportation needs    Medical: No    Non-medical: No  Tobacco Use  . Smoking status: Former Smoker    Packs/day: 1.50    Years: 33.00    Pack years: 49.50    Types: Cigarettes    Quit date: 08/16/2004    Years since quitting: 14.0  . Smokeless tobacco: Current User    Types: Snuff  . Tobacco comment: Dips every once in a while  Substance and Sexual Activity  . Alcohol use: Yes    Alcohol/week: 3.0 standard drinks    Types: 3 Cans of beer per week    Comment: drinks 6 pack of beer on weekends  . Drug use: No  . Sexual activity: Not on file  Lifestyle  . Physical activity    Days per week: Not on file    Minutes per session: Not on file  . Stress: Not on file  Relationships  . Social Herbalist on phone: Not on file    Gets together: Not on file    Attends religious service: Not on file    Active member of club or organization: Not on file    Attends meetings of clubs or organizations: Not on file    Relationship status: Not on file  Other Topics Concern  . Not on file  Social History Narrative   Lives alone   No caffeine   Right handed   Additional Social History:                         Sleep: Fair  Appetite:  Fair  Current Medications: Current Facility-Administered Medications  Medication Dose Route Frequency Provider  Last Rate Last Dose  . acetaminophen (TYLENOL) tablet 650 mg  650 mg Oral Q6H PRN Lavella Hammock, MD      . alum & mag hydroxide-simeth (MAALOX/MYLANTA) 200-200-20 MG/5ML suspension 30 mL  30 mL Oral Q4H PRN Lavella Hammock, MD      . Derrill Memo ON 08/30/2018] amLODipine (NORVASC) tablet 5 mg  5 mg Oral Daily Clapacs, John T, MD      . aspirin EC tablet 81 mg  81 mg Oral Daily Lavella Hammock, MD   81 mg at 08/29/18 0806  . calcium-vitamin D (OSCAL WITH D) 500-200 MG-UNIT per tablet 1 tablet  1 tablet Oral Q breakfast Lavella Hammock, MD   1 tablet at 08/29/18 0805  . cholecalciferol (VITAMIN D3) tablet 400 Units  400 Units Oral Daily Lavella Hammock, MD   400 Units at 08/29/18 [phone]  . FLUoxetine (PROZAC) capsule 20 mg  20 mg Oral Daily Lavella Hammock, MD   20 mg at 08/29/18 5784  . folic acid (FOLVITE) tablet 0.5 mg  500 mcg Oral Daily Lavella Hammock, MD   0.5 mg at 08/29/18 6962  . LORazepam (ATIVAN) injection 0-4 mg  0-4 mg Intravenous Q12H Lavella Hammock, MD       Or  . LORazepam (ATIVAN) tablet 0-4 mg  0-4 mg Oral Q12H Lavella Hammock, MD   2 mg at 08/29/18 1720  . magnesium hydroxide (MILK OF MAGNESIA) suspension 30 mL  30 mL Oral Daily PRN Lavella Hammock, MD      . meloxicam [facility]) tablet 15 mg  15 mg Oral Daily Lavella Hammock, MD   15 mg at 08/29/18 0805  . metoprolol tartrate (LOPRESSOR) tablet 25 mg  25 mg Oral BID Lavella Hammock, MD   25 mg at 08/29/18 1717  . moxifloxacin (VIGAMOX) 0.5 % ophthalmic solution 1 drop  1 drop Both Eyes TID Clapacs, John T, MD      . multivitamin with minerals tablet 1 tablet  1 tablet Oral Daily Lavella Hammock, MD   1 tablet at 08/29/18 [phone]  . potassium chloride SA (K-DUR) CR tablet 20 mEq  20 mEq Oral BID Clapacs, Madie Reno, MD   20 mEq at 08/29/18 1721  . prednisoLONE acetate (PRED FORTE) 1 % ophthalmic suspension 1 drop  1 drop Both Eyes QID Clapacs, Madie Reno, MD   1 drop at 08/29/18 1721  . QUEtiapine (SEROQUEL) tablet 100 mg  100 mg Oral  QHS Clapacs, John T, MD      . thiamine (VITAMIN B-1) tablet 100 mg  100 mg Oral Daily Lavella Hammock, MD   100 mg at 08/29/18 0815   Or  . thiamine (B-1) injection 100 mg  100 mg Intravenous Daily Lavella Hammock, MD      . vitamin B-12 (CYANOCOBALAMIN) tablet 5,000 mcg  5,000 mcg Oral Daily Lavella Hammock, MD   5,000 mcg at 08/29/18 4132    Lab Results:  Results for orders placed or performed during the hospital encounter of 08/27/18 (from the past 48 hour(s))  Basic metabolic panel     Status: Abnormal   Collection Time: 08/29/18  7:07 AM  Result Value Ref Range   Sodium 137 135 - 145 mmol/L   Potassium 3.2 (L) 3.5 - 5.1 mmol/L   Chloride 101 98 - 111 mmol/L   CO2 26 22 - 32 mmol/L   Glucose, Bld 98 70 - 99 mg/dL   BUN 6 6 - 20 mg/dL   Creatinine, Ser 1.21 0.61 - 1.24 mg/dL   Calcium 9.6 8.9 - 10.3 mg/dL   GFR calc non Af Amer >60 >60 mL/min   GFR calc Af Amer >60 >60 mL/min   Anion gap 10 5 - 15    Comment: Performed at [facility] [place] [place], [place] [address]    Blood Alcohol level:  Lab Results  Component Value Date   ETH 208 (H) 08/27/2018   ETH <10 16/09/3708    Metabolic Disorder Labs: Lab Results  Component Value Date   HGBA1C 5.4 08/27/2018   MPG 108.28 08/27/2018   MPG 100 10/02/2016   No results found for: PROLACTIN Lab Results  Component Value Date   CHOL 438 (H) 08/27/2018   TRIG 855 (H) 08/27/2018   HDL 47 08/27/2018   CHOLHDL 9.3 08/27/2018   VLDL UNABLE TO CALCULATE IF TRIGLYCERIDE OVER 400 mg/dL 08/27/2018   LDLCALC UNABLE TO CALCULATE IF TRIGLYCERIDE OVER 400 mg/dL 08/27/2018   LDLCALC 87 03/03/2016    Physical Findings: AIMS:  , ,  ,  ,    CIWA:  CIWA-Ar Total: 0 COWS:     Musculoskeletal: Strength & Muscle Tone: within normal limits Gait & Station: normal Patient leans: N/A  Psychiatric Specialty Exam: Physical Exam  Nursing note and vitals reviewed. Constitutional: He appears well-developed and  well-nourished.  HENT:  Head: Normocephalic and atraumatic.  Eyes: Pupils are equal, round, and reactive to light. Conjunctivae are normal.  Neck: Normal range of motion.  Cardiovascular: Regular rhythm and normal heart sounds.  Respiratory: Effort normal. No respiratory distress.  GI: Soft.  Musculoskeletal: Normal range of motion.  Neurological: He is alert.  Skin: Skin is warm and dry.  Psychiatric: His speech is normal and behavior is normal. His mood appears anxious. Thought content is paranoid. Cognition and memory are normal. He expresses impulsivity.    Review of Systems  Constitutional: Negative.   HENT: Negative.   Eyes: Negative.   Respiratory: Negative.   Cardiovascular: Negative.   Gastrointestinal: Negative.   Musculoskeletal: Negative.   Skin: Negative.   Neurological: Negative.   Psychiatric/Behavioral: Positive for substance abuse. Negative for depression, hallucinations, memory loss and suicidal ideas. The patient is nervous/anxious. The patient does not have insomnia.     Blood pressure (!) 144/95, pulse 84, temperature 98 F (36.7 C), temperature source Oral, resp. rate 18, height 6\' 2"  (1.88 m), weight 81.6 kg, SpO2 100 %.Body mass index is 23.1 kg/m.  General Appearance: Casual  Eye Contact:  Good  Speech:  Normal Rate  Volume:  Normal  Mood:  Euthymic  Affect:  Congruent  Thought Process:  Disorganized  Orientation:  Full (Time, Place, and Person)  Thought Content:  Illogical and Paranoid Ideation  Suicidal Thoughts:  No  Homicidal Thoughts:  No  Memory:  Immediate;   Fair Recent;   Fair Remote;   Fair  Judgement:  Fair  Insight:  Fair  Psychomotor Activity:  Normal  Concentration:  Concentration: Fair  Recall:  [employer] of Knowledge:  Fair  Language:  Fair  Akathisia:  No  Handed:  Right  AIMS (if indicated):     Assets:  Desire for Improvement Housing  ADL's:  Intact  Cognition:  WNL  Sleep:  Number of Hours: 8.25     Treatment  Plan Summary: Daily contact with patient to assess and evaluate symptoms and progress in treatment, Medication management and Plan Physically he seems in good shape.  No physical signs of withdrawal.  Does not appear to be delirious.  The paranoid ideation is a little hard to assess.  It is not completely impossible that he has a realistic point but it sounds pretty weird.  I am going to go ahead and increase the modest dose of Seroquel to a slightly  higher but still modest dose of Seroquel at night.  We are still waiting for word about possible transfer to a substance abuse facility  Alethia Berthold, MD 08/29/2018, 6:14 PM

## 2018-08-30 SURGERY — PHACOEMULSIFICATION, CATARACT, WITH IOL INSERTION
Anesthesia: Monitor Anesthesia Care | Laterality: Left

## 2018-08-30 MED ORDER — TROLAMINE SALICYLATE 10 % EX CREA
TOPICAL_CREAM | Freq: Two times a day (BID) | CUTANEOUS | Status: DC | PRN
  Administered 2018-09-01: 08:00:00 via TOPICAL
  Filled 2018-08-30: qty 85

## 2018-08-30 NOTE — Progress Notes (Signed)
Recreation Therapy Notes          Tristan Cook 08/30/2018 11:24 AM

## 2018-08-30 NOTE — Progress Notes (Signed)
[facility] MD Progress Note  08/30/2018 4:56 PM Tristan Cook  MRN:  [id number] Subjective: Patient seen and chart reviewed.  Patient says his mood is feeling great.  Denies any sense of depression.  Denies any suicidal thoughts.  Continues to request substance abuse rehab treatment.  We have found out that he will be accepted but the date of bed availability is still unknown.  Blood pressure stable.  No physical complaints today other than the pain in his shoulder. Principal Problem: MDD (major depressive disorder), recurrent, severe, with psychosis ([place]) Diagnosis: Principal Problem:   MDD (major depressive disorder), recurrent, severe, with psychosis ([place]) Active Problems:   Essential hypertension   Alcohol abuse  Total Time spent with patient: 30 minutes  Past Psychiatric History: Past history of chronic alcohol abuse and depression  Past Medical History:  Past Medical History:  Diagnosis Date  . Abdominal pain   . Adenomatous colon polyp 2008  . Arthritis   . Back pain   . Bilateral lumbar radiculopathy   . Blurred vision, bilateral   . Chronic back pain   . Constipation   . Depression   . GERD (gastroesophageal reflux disease)   . Gout   . Hemorrhoids   . High cholesterol   . Hypertension   . Neck pain   . Neuropathy   . Neuropathy of both feet   . Numbness    rectal  . Numbness of legs   . Stroke ([place]) 2019  . Substance abuse ([place])    h/o excessive alcohol use; pt says he limits use to one to 2 beers daily he currently    Past Surgical History:  Procedure Laterality Date  . CATARACT EXTRACTION W/PHACO Right 08/16/2018   Procedure: CATARACT EXTRACTION PHACO AND INTRAOCULAR LENS PLACEMENT RIGHT EYE;  Surgeon: Baruch Goldmann, MD;  Location: [facility];  Service: Ophthalmology;  Laterality: Right;  CDE: 4.18  . COLONOSCOPY  10/09/2006   3 mm pedunculated sigmoid colon polyp removed/8-mm sessile hepatic flexure polyp (tubular villous adenoma) removed/6-mm  descending colon polyp  removed/small internal hemorrhoids  . COLONOSCOPY  02/28/2011   QIO:[id number], multiple in the rectum/Internal hemorrhoids, MODERATE-CAUSING RECTAL BLEEDING  . COLONOSCOPY N/A 07/05/2016   Procedure: COLONOSCOPY;  Surgeon: [name], MD;  [facility];  Service: Endoscopy;  Laterality: N/A;  11:15am  . 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 Mild erosive gastrtitis most likely due to indomethacin   . FINGER SURGERY Right    4th and 5th digit  . FLEXIBLE SIGMOIDOSCOPY  01/02/2012   SLF: 1. the colonic mucosa appeared normal in the sigmoid colon 2. Large internal hemorrhoids: cause for rectal bleeding/pain . s/p banding X 3   . HAND SURGERY    . REVERSE SHOULDER ARTHROPLASTY Left 05/29/2018   Procedure: REVERSE SHOULDER ARTHROPLASTY;  Surgeon: Hiram Gash, MD;  Location: [facility];  Service: Orthopedics;  Laterality: Left;  . SAVORY DILATION N/A 07/21/2014   Procedure: SAVORY DILATION;  Surgeon: [name], MD;  [facility];  Service: Endoscopy;  Laterality: N/A;   Family History:  Family History  Problem Relation Age of Onset  . Hypertension Mother   . Hypertension Father   . Stroke Father   . Hypertension Sister   . Hypertension Brother   . Cancer Brother   . Cancer Brother        throat cancer  . Hypertension Brother   . Diabetes Maternal Grandmother   .  Colon cancer Neg Hx    Family Psychiatric  History: See previous Social History:  Social History   Substance and Sexual Activity  Alcohol Use Yes  . Alcohol/week: 3.0 standard drinks  . Types: 3 Cans of beer per week   Comment: drinks 6 pack of beer on weekends     Social History   Substance and Sexual Activity  Drug Use No    Social History   Socioeconomic History  . Marital status: Single    Spouse name: Not on file  . Number of children: 0  . Years of education: 9  . Highest education level: Not on file  Occupational  History  . Occupation: unemployed, Retail buyer: UNEMPLOYED  Social Needs  . Financial resource strain: Not on file  . Food insecurity    Worry: Not on file    Inability: Not on file  . Transportation needs    Medical: No    Non-medical: No  Tobacco Use  . Smoking status: Former Smoker    Packs/day: 1.50    Years: 33.00    Pack years: 49.50    Types: Cigarettes    Quit date: 08/16/2004    Years since quitting: 14.0  . Smokeless tobacco: Current User    Types: Snuff  . Tobacco comment: Dips every once in a while  Substance and Sexual Activity  . Alcohol use: Yes    Alcohol/week: 3.0 standard drinks    Types: 3 Cans of beer per week    Comment: drinks 6 pack of beer on weekends  . Drug use: No  . Sexual activity: Not on file  Lifestyle  . Physical activity    Days per week: Not on file    Minutes per session: Not on file  . Stress: Not on file  Relationships  . Social Herbalist on phone: Not on file    Gets together: Not on file    Attends religious service: Not on file    Active member of club or organization: Not on file    Attends meetings of clubs or organizations: Not on file    Relationship status: Not on file  Other Topics Concern  . Not on file  Social History Narrative   Lives alone   No caffeine   Right handed   Additional Social History:                         Sleep: Fair  Appetite:  Fair  Current Medications: Current Facility-Administered Medications  Medication Dose Route Frequency Provider Last Rate Last Dose  . acetaminophen (TYLENOL) tablet 650 mg  650 mg Oral Q6H PRN Lavella Hammock, MD   650 mg at 08/30/18 1456  . alum & mag hydroxide-simeth (MAALOX/MYLANTA) 200-200-20 MG/5ML suspension 30 mL  30 mL Oral Q4H PRN Lavella Hammock, MD      . amLODipine (NORVASC) tablet 5 mg  5 mg Oral Daily Clapacs, Madie Reno, MD   5 mg at 08/30/18 0819  . aspirin EC tablet 81 mg  81 mg Oral Daily Lavella Hammock, MD   81 mg  at 08/30/18 1194  . calcium-vitamin D (OSCAL WITH D) 500-200 MG-UNIT per tablet 1 tablet  1 tablet Oral Q breakfast Lavella Hammock, MD   1 tablet at 08/30/18 [phone]  . cholecalciferol (VITAMIN D3) tablet 400 Units  400 Units Oral Daily Lavella Hammock, MD   400  Units at 08/30/18 0812  . FLUoxetine (PROZAC) capsule 20 mg  20 mg Oral Daily Lavella Hammock, MD   20 mg at 08/30/18 0819  . folic acid (FOLVITE) tablet 0.5 mg  500 mcg Oral Daily Lavella Hammock, MD   0.5 mg at 08/30/18 1601  . LORazepam (ATIVAN) injection 0-4 mg  0-4 mg Intravenous Q12H Lavella Hammock, MD       Or  . LORazepam (ATIVAN) tablet 0-4 mg  0-4 mg Oral Q12H Lavella Hammock, MD   2 mg at 08/30/18 1649  . magnesium hydroxide (MILK OF MAGNESIA) suspension 30 mL  30 mL Oral Daily PRN Lavella Hammock, MD      . meloxicam [facility]) tablet 15 mg  15 mg Oral Daily Lavella Hammock, MD   15 mg at 08/30/18 0813  . metoprolol tartrate (LOPRESSOR) tablet 25 mg  25 mg Oral BID Lavella Hammock, MD   25 mg at 08/30/18 1649  . moxifloxacin (VIGAMOX) 0.5 % ophthalmic solution 1 drop  1 drop Both Eyes TID Clapacs, John T, MD      . multivitamin with minerals tablet 1 tablet  1 tablet Oral Daily Lavella Hammock, MD   1 tablet at 08/30/18 [phone]  . potassium chloride SA (K-DUR) CR tablet 20 mEq  20 mEq Oral BID Clapacs, Madie Reno, MD   20 mEq at 08/30/18 1649  . prednisoLONE acetate (PRED FORTE) 1 % ophthalmic suspension 1 drop  1 drop Both Eyes QID Clapacs, Madie Reno, MD   1 drop at 08/30/18 1651  . QUEtiapine (SEROQUEL) tablet 100 mg  100 mg Oral QHS Clapacs, John T, MD   100 mg at 08/29/18 2151  . thiamine (VITAMIN B-1) tablet 100 mg  100 mg Oral Daily Lavella Hammock, MD   100 mg at 08/30/18 0813   Or  . thiamine (B-1) injection 100 mg  100 mg Intravenous Daily Lavella Hammock, MD      . vitamin B-12 (CYANOCOBALAMIN) tablet 5,000 mcg  5,000 mcg Oral Daily Lavella Hammock, MD   5,000 mcg at 08/30/18 0813    Lab Results:  Results for  orders placed or performed during the hospital encounter of 08/27/18 (from the past 48 hour(s))  Basic metabolic panel     Status: Abnormal   Collection Time: 08/29/18  7:07 AM  Result Value Ref Range   Sodium 137 135 - 145 mmol/L   Potassium 3.2 (L) 3.5 - 5.1 mmol/L   Chloride 101 98 - 111 mmol/L   CO2 26 22 - 32 mmol/L   Glucose, Bld 98 70 - 99 mg/dL   BUN 6 6 - 20 mg/dL   Creatinine, Ser 1.21 0.61 - 1.24 mg/dL   Calcium 9.6 8.9 - 10.3 mg/dL   GFR calc non Af Amer >60 >60 mL/min   GFR calc Af Amer >60 >60 mL/min   Anion gap 10 5 - 15    Comment: Performed at [facility], [address] [place], [place] [address]    Blood Alcohol level:  Lab Results  Component Value Date   ETH 208 (H) 08/27/2018   ETH <10 22/04/5425    Metabolic Disorder Labs: Lab Results  Component Value Date   HGBA1C 5.4 08/27/2018   MPG 108.28 08/27/2018   MPG 100 10/02/2016   No results found for: PROLACTIN Lab Results  Component Value Date   CHOL 438 (H) 08/27/2018   TRIG 855 (H) 08/27/2018  HDL 47 08/27/2018   CHOLHDL 9.3 08/27/2018   VLDL UNABLE TO CALCULATE IF TRIGLYCERIDE OVER 400 mg/dL 08/27/2018   LDLCALC UNABLE TO CALCULATE IF TRIGLYCERIDE OVER 400 mg/dL 08/27/2018   LDLCALC 87 03/03/2016    Physical Findings: AIMS:  , ,  ,  ,    CIWA:  CIWA-Ar Total: 0 COWS:     Musculoskeletal: Strength & Muscle Tone: within normal limits Gait & Station: normal Patient leans: N/A  Psychiatric Specialty Exam: Physical Exam  Nursing note and vitals reviewed. Constitutional: He appears well-developed and well-nourished.  HENT:  Head: Normocephalic and atraumatic.  Eyes: Pupils are equal, round, and reactive to light. Conjunctivae are normal.  Neck: Normal range of motion.  Cardiovascular: Regular rhythm and normal heart sounds.  Respiratory: Effort normal. No respiratory distress.  GI: Soft.  Musculoskeletal: Normal range of motion.  Neurological: He is alert.  Skin: Skin is  warm and dry.  Psychiatric: He has a normal mood and affect. His speech is normal and behavior is normal. Judgment and thought content normal. His affect is not blunt. Cognition and memory are normal.    Review of Systems  Constitutional: Negative.   HENT: Negative.   Eyes: Negative.   Respiratory: Negative.   Cardiovascular: Negative.   Gastrointestinal: Negative.   Musculoskeletal: Negative.   Skin: Negative.   Neurological: Negative.   Psychiatric/Behavioral: Negative.     Blood pressure 132/86, pulse (!) 102, temperature 97.7 F (36.5 C), temperature source Oral, resp. rate 16, height 6\' 2"  (1.88 m), weight 81.6 kg, SpO2 99 %.Body mass index is 23.1 kg/m.  General Appearance: Fairly Groomed  Eye Contact:  Good  Speech:  Clear and Coherent  Volume:  Normal  Mood:  Euthymic  Affect:  Congruent  Thought Process:  Goal Directed  Orientation:  Full (Time, Place, and Person)  Thought Content:  Logical  Suicidal Thoughts:  No  Homicidal Thoughts:  No  Memory:  Immediate;   Fair Recent;   Fair Remote;   Fair  Judgement:  Fair  Insight:  Fair  Psychomotor Activity:  Normal  Concentration:  Concentration: Fair  Recall:  [employer] of Knowledge:  Fair  Language:  Fair  Akathisia:  No  Handed:  Right  AIMS (if indicated):     Assets:  Desire for Improvement Housing Physical Health  ADL's:  Intact  Cognition:  WNL  Sleep:  Number of Hours: 7     Treatment Plan Summary: Daily contact with patient to assess and evaluate symptoms and progress in treatment, Medication management and Plan No change to medication.  Supportive therapy and review of treatment plan.  Still aiming for likely discharge to substance abuse treatment next week.  I will see if we have anything available that could help with the soreness in his shoulder.  Alethia Berthold, MD 08/30/2018, 4:56 PM

## 2018-08-30 NOTE — Plan of Care (Signed)
Patient is alert and oriented X 3. Patient seems very much upbeat and animated today. Patient seems to have some mild confusion this morning. Patient talked about  " I will be moving out of town because there is no peace at home." Patient states he lives in [place] but originally from [place]. Patient denies SI, HI and AVH. Patient is very pleasant and cooperative. Patient enjoys walking on the unit " Its my exercise." Safety checks to continue Q  Problem: Education: Goal: Knowledge of [place] Education information/materials will improve Outcome: Progressing   Problem: Safety: Goal: Periods of time without injury will increase Outcome: Progressing   Problem: Safety: Goal: Ability to disclose and discuss suicidal ideas will improve Outcome: Progressing Goal: Ability to identify and utilize support systems that promote safety will improve Outcome: Progressing   Problem: Self-Concept: Goal: Level of anxiety will decrease Outcome: Progressing  15 minutes.

## 2018-08-30 NOTE — BHH Group Notes (Signed)
LCSW Group Therapy Note  08/30/2018 1:00PM  Type of Therapy and Topic:  Group Therapy:  Feelings around Relapse and Recovery  Participation Level:  Active   Description of Group:    Patients in this group will discuss emotions they experience before and after a relapse. They will process how experiencing these feelings, or avoidance of experiencing them, relates to having a relapse. Facilitator will guide patients to explore emotions they have related to recovery. Patients will be encouraged to process which emotions are more powerful. They will be guided to discuss the emotional reaction significant others in their lives may have to their relapse or recovery. Patients will be assisted in exploring ways to respond to the emotions of others without this contributing to a relapse.  Therapeutic Goals: 1. Patient will identify two or more emotions that lead to a relapse for them 2. Patient will identify two emotions that result when they relapse 3. Patient will identify two emotions related to recovery 4. Patient will demonstrate ability to communicate their needs through discussion and/or role plays   Summary of Patient Progress: Patient was present for group and attentive. Patient engaged in discussion on relapse.  He reports that "boredom" has been an emotion associated with his relapse.  He reports that being around positive energy has supported his recovery.  He reports that he uses exercise as a coping skill.  Patient was clear and well thought out in group.    Therapeutic Modalities:   Cognitive Behavioral Therapy Solution-Focused Therapy Assertiveness Training Relapse Prevention Therapy   Assunta Curtis, MSW, LCSW 08/30/2018 2:11 PM

## 2018-08-30 NOTE — BHH Counselor (Signed)
CSW called [facility] and was informed that patient has been approved.  CSW was asked to call back on Monday to see about bed availability.  Assunta Curtis, MSW, LCSW 08/30/2018 2:37 PM

## 2018-08-30 NOTE — Plan of Care (Signed)
  Problem: Self-Concept: Goal: Level of anxiety will decrease Outcome: Progressing  Patient appears less anxious interacting appropriately with peers and staff.

## 2018-08-30 NOTE — Progress Notes (Signed)
Patient alert and oriented x 3 with some periods of confusion to situation, affect is flat but he brightens upon approach no distress noted, his thoughts are organized and coherent, he denies SI/HI/AVH denies pain at this time stated " I feel better"  Patient was complaint with prescribed medication regimen, he attended evening wrap up group and was appropriate. 15 minutes safety checks maintained will continue to monitor.

## 2018-08-31 LAB — BASIC METABOLIC PANEL
Anion gap: 7 (ref 5–15)
BUN: 11 mg/dL (ref 6–20)
CO2: 26 mmol/L (ref 22–32)
Calcium: 9.4 mg/dL (ref 8.9–10.3)
Chloride: 107 mmol/L (ref 98–111)
Creatinine, Ser: 1.23 mg/dL (ref 0.61–1.24)
GFR calc Af Amer: >60 mL/min (ref >60)
GFR calc non Af Amer: >60 mL/min (ref >60)
Glucose, Bld: 92 mg/dL (ref 70–99)
Potassium: 3.7 mmol/L (ref 3.5–5.1)
Sodium: 140 mmol/L (ref 135–145)

## 2018-08-31 NOTE — Plan of Care (Signed)
Pt. Reports improvement in anxiety. Pt. Verbalizes understanding education provided. Pt. Denies si/hi/avh, able to contract for safety. Pt. Monitored for safety. High falls risk interventions in place for safety.    Problem: Education: Goal: Knowledge of [place] General Education information/materials will improve Outcome: Progressing   Problem: Safety: Goal: Periods of time without injury will increase Outcome: Progressing   Problem: Safety: Goal: Ability to disclose and discuss suicidal ideas will improve Outcome: Progressing   Problem: Self-Concept: Goal: Level of anxiety will decrease Outcome: Progressing

## 2018-08-31 NOTE — Progress Notes (Signed)
Patient alert and oriented x 4, affect is flat but he brightens upon approach, no distress noted, interacting appropriately with peers and staff his thoughts are organized and coherent, he is compliant with medication regimen. Patient rated depression 5/110  ( 0 low - 10 high). He appears less anxious, 15 minutes safety checks maintained will continue to monitor.

## 2018-08-31 NOTE — [facility] Group Notes (Signed)
LCSW Group Therapy Note  08/31/2018  1:00 PM   Type of Therapy and Topic:  Group Therapy:  Trust and Honesty   Participation Level:  Active   Description of Group:    In this group patients will be asked to explore the value of being honest.  Patients will be guided to discuss their thoughts, feelings, and behaviors related to honesty and trusting in others. Patients will process together how trust and honesty relate to forming relationships with peers, family members, and self. Each patient will be challenged to identify and express feelings of being vulnerable. Patients will discuss reasons why people are dishonest and identify alternative outcomes if one was truthful (to self or others). This group will be process-oriented, with patients participating in exploration of their own experiences, giving and receiving support, and processing challenge from other group members.   Therapeutic Goals: 1. Patient will identify why honesty is important to relationships and how honesty overall affects relationships.  2. Patient will identify a situation where they lied or were lied too and the  feelings, thought process, and behaviors surrounding the situation 3. Patient will identify the meaning of being vulnerable, how that feels, and how that correlates to being honest with self and others. 4. Patient will identify situations where they could have told the truth, but instead lied and explain reasons of dishonesty.   Summary of Patient Progress:  Patient was present and attentive in group.  Patient engaged in discussion on trust and honesty.  Patient reports that a key component of honesty is friendships.  He reports that what he is looking for in a trustworthy and honest friendship.relationship is eye contact.  He reports that people engage in untrustworthy and dishonest behavior due to being "failures".      Therapeutic Modalities:   Cognitive Behavioral Therapy Solution Focused Therapy Motivational  Interviewing Brief Therapy  Assunta Curtis, MSW, LCSW 08/31/2018 12:23 PM

## 2018-08-31 NOTE — Progress Notes (Addendum)
D: Pt during assessments is pleasant and cooperative. Pt. Engagement is fair. Pt. Endorses a normal mood. Pt. Denies si/hi/avh, able to contract for safety.   A: Q x 15 minute observation checks to be completed for safety. Patient was provided with education.  Patient was given/offered medications per orders. Patient  was encourage to attend groups, participate in unit activities and continue with plan of care for the day. Pt. Chart and plans of care reviewed. Pt. Given support and encouragement.   R: Patient is complaint with medications and unit procedures this morning. Pt. Observed eating good during breakfast. Pt. Interactions with staff and peers appropriate. Pt. Observed out in the milieu interacting with peers. High falls risk interventions in place, but patient thus far as declined to utilize front wheel walker or other interventions for safety. Pain being addressed utilizing MD orders.

## 2018-08-31 NOTE — Plan of Care (Signed)
  Problem: Education: Goal: Utilization of techniques to improve thought processes will improve Outcome: Progressing  Patient implementing techniques, defence mechanism to improve thought process.

## 2018-08-31 NOTE — Progress Notes (Signed)
[facility] MD Progress Note  08/31/2018 12:08 PM Tristan Cook  MRN:  [id number] Subjective: Patient seen and chart reviewed.  Patient has no new complaints.  Affect is bright and upbeat.  Mood stated as being good.  Denies suicidal thought denies homicidal thought.  No physical complaints.  Patient discusses appropriately how he is looking forward to going to the alcohol and drug abuse treatment center.  Potassium has stabilized.  Blood pressure under good control. Principal Problem: MDD (major depressive disorder), recurrent, severe, with psychosis ([place]) Diagnosis: Principal Problem:   MDD (major depressive disorder), recurrent, severe, with psychosis ([place]) Active Problems:   Essential hypertension   Alcohol abuse  Total Time spent with patient: 20 minutes  Past Psychiatric History: Past history of alcohol abuse and depression but minimal past treatment no previous substance abuse treatment  Past Medical History:  Past Medical History:  Diagnosis Date  . Abdominal pain   . Adenomatous colon polyp 2008  . Arthritis   . Back pain   . Bilateral lumbar radiculopathy   . Blurred vision, bilateral   . Chronic back pain   . Constipation   . Depression   . GERD (gastroesophageal reflux disease)   . Gout   . Hemorrhoids   . High cholesterol   . Hypertension   . Neck pain   . Neuropathy   . Neuropathy of both feet   . Numbness    rectal  . Numbness of legs   . Stroke ([place]) 2019  . Substance abuse ([place])    h/o excessive alcohol use; pt says he limits use to one to 2 beers daily he currently    Past Surgical History:  Procedure Laterality Date  . CATARACT EXTRACTION W/PHACO Right 08/16/2018   Procedure: CATARACT EXTRACTION PHACO AND INTRAOCULAR LENS PLACEMENT RIGHT EYE;  Surgeon: Baruch Goldmann, MD;  Location: [facility];  Service: Ophthalmology;  Laterality: Right;  CDE: 4.18  . COLONOSCOPY  10/09/2006   3 mm pedunculated sigmoid colon polyp removed/8-mm sessile hepatic flexure polyp  (tubular villous adenoma) removed/6-mm  descending colon polyp removed/small internal hemorrhoids  . COLONOSCOPY  02/28/2011   PJA:[id number], multiple in the rectum/Internal hemorrhoids, MODERATE-CAUSING RECTAL BLEEDING  . COLONOSCOPY N/A 07/05/2016   Procedure: COLONOSCOPY;  Surgeon: [name], MD;  Location: [facility];  Service: Endoscopy;  Laterality: N/A;  11:15am  . 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 Mild erosive gastrtitis most likely due to indomethacin   . FINGER SURGERY Right    4th and 5th digit  . FLEXIBLE SIGMOIDOSCOPY  01/02/2012   SLF: 1. the colonic mucosa appeared normal in the sigmoid colon 2. Large internal hemorrhoids: cause for rectal bleeding/pain . s/p banding X 3   . HAND SURGERY    . REVERSE SHOULDER ARTHROPLASTY Left 05/29/2018   Procedure: REVERSE SHOULDER ARTHROPLASTY;  Surgeon: Hiram Gash, MD;  Location: [facility];  Service: Orthopedics;  Laterality: Left;  . SAVORY DILATION N/A 07/21/2014   Procedure: SAVORY DILATION;  Surgeon: [name], MD;  Location: [facility];  Service: Endoscopy;  Laterality: N/A;   Family History:  Family History  Problem Relation Age of Onset  . Hypertension Mother   . Hypertension Father   . Stroke Father   . Hypertension Sister   . Hypertension Brother   . Cancer Brother   . Cancer Brother        throat cancer  . Hypertension Brother   . Diabetes Maternal  Grandmother   . Colon cancer Neg Hx    Family Psychiatric  History: See previous Social History:  Social History   Substance and Sexual Activity  Alcohol Use Yes  . Alcohol/week: 3.0 standard drinks  . Types: 3 Cans of beer per week   Comment: drinks 6 pack of beer on weekends     Social History   Substance and Sexual Activity  Drug Use No    Social History   Socioeconomic History  . Marital status: Single    Spouse name: Not on file  . Number of children: 0  . Years of education: 11   . Highest education level: Not on file  Occupational History  . Occupation: unemployed, Retail buyer: UNEMPLOYED  Social Needs  . Financial resource strain: Not on file  . Food insecurity    Worry: Not on file    Inability: Not on file  . Transportation needs    Medical: No    Non-medical: No  Tobacco Use  . Smoking status: Former Smoker    Packs/day: 1.50    Years: 33.00    Pack years: 49.50    Types: Cigarettes    Quit date: 08/16/2004    Years since quitting: 14.0  . Smokeless tobacco: Current User    Types: Snuff  . Tobacco comment: Dips every once in a while  Substance and Sexual Activity  . Alcohol use: Yes    Alcohol/week: 3.0 standard drinks    Types: 3 Cans of beer per week    Comment: drinks 6 pack of beer on weekends  . Drug use: No  . Sexual activity: Not on file  Lifestyle  . Physical activity    Days per week: Not on file    Minutes per session: Not on file  . Stress: Not on file  Relationships  . Social Herbalist on phone: Not on file    Gets together: Not on file    Attends religious service: Not on file    Active member of club or organization: Not on file    Attends meetings of clubs or organizations: Not on file    Relationship status: Not on file  Other Topics Concern  . Not on file  Social History Narrative   Lives alone   No caffeine   Right handed   Additional Social History:                         Sleep: Fair  Appetite:  Fair  Current Medications: Current Facility-Administered Medications  Medication Dose Route Frequency Provider Last Rate Last Dose  . acetaminophen (TYLENOL) tablet 650 mg  650 mg Oral Q6H PRN Lavella Hammock, MD   650 mg at 08/30/18 2233  . alum & mag hydroxide-simeth (MAALOX/MYLANTA) 200-200-20 MG/5ML suspension 30 mL  30 mL Oral Q4H PRN Lavella Hammock, MD      . amLODipine (NORVASC) tablet 5 mg  5 mg Oral Daily Camyah Pultz, Madie Reno, MD   5 mg at 08/31/18 0742  . aspirin EC  tablet 81 mg  81 mg Oral Daily Lavella Hammock, MD   81 mg at 08/31/18 0744  . calcium-vitamin D (OSCAL WITH D) 500-200 MG-UNIT per tablet 1 tablet  1 tablet Oral Q breakfast Lavella Hammock, MD   1 tablet at 08/31/18 0745  . cholecalciferol (VITAMIN D3) tablet 400 Units  400 Units Oral Daily Lavella Hammock,  MD   400 Units at 08/31/18 0744  . FLUoxetine (PROZAC) capsule 20 mg  20 mg Oral Daily Lavella Hammock, MD   20 mg at 08/31/18 0744  . folic acid (FOLVITE) tablet 0.5 mg  500 mcg Oral Daily Lavella Hammock, MD   0.5 mg at 08/31/18 0744  . LORazepam (ATIVAN) injection 0-4 mg  0-4 mg Intravenous Q12H Lavella Hammock, MD       Or  . LORazepam (ATIVAN) tablet 0-4 mg  0-4 mg Oral Q12H Lavella Hammock, MD   2 mg at 08/31/18 0630  . magnesium hydroxide (MILK OF MAGNESIA) suspension 30 mL  30 mL Oral Daily PRN Lavella Hammock, MD      . meloxicam [facility]) tablet 15 mg  15 mg Oral Daily Lavella Hammock, MD   15 mg at 08/31/18 3810  . metoprolol tartrate (LOPRESSOR) tablet 25 mg  25 mg Oral BID Lavella Hammock, MD   25 mg at 08/31/18 0744  . moxifloxacin (VIGAMOX) 0.5 % ophthalmic solution 1 drop  1 drop Both Eyes TID Nikalas Bramel T, MD      . multivitamin with minerals tablet 1 tablet  1 tablet Oral Daily Lavella Hammock, MD   1 tablet at 08/31/18 [phone]  . potassium chloride SA (K-DUR) CR tablet 20 mEq  20 mEq Oral BID Teanna Elem, Madie Reno, MD   20 mEq at 08/31/18 0742  . prednisoLONE acetate (PRED FORTE) 1 % ophthalmic suspension 1 drop  1 drop Both Eyes QID Labrian Torregrossa, Madie Reno, MD   1 drop at 08/31/18 1202  . QUEtiapine (SEROQUEL) tablet 100 mg  100 mg Oral QHS Babs Dabbs T, MD   100 mg at 08/30/18 2233  . thiamine (VITAMIN B-1) tablet 100 mg  100 mg Oral Daily Lavella Hammock, MD   100 mg at 08/31/18 0258   Or  . thiamine (B-1) injection 100 mg  100 mg Intravenous Daily Lavella Hammock, MD      . trolamine salicylate (ASPERCREME) 10 % cream   Topical BID PRN Novalee Horsfall, Madie Reno, MD      .  vitamin B-12 (CYANOCOBALAMIN) tablet 5,000 mcg  5,000 mcg Oral Daily Lavella Hammock, MD   5,000 mcg at 08/31/18 0830    Lab Results:  Results for orders placed or performed during the hospital encounter of 08/27/18 (from the past 48 hour(s))  Basic metabolic panel     Status: None   Collection Time: 08/31/18  7:39 AM  Result Value Ref Range   Sodium 140 135 - 145 mmol/L   Potassium 3.7 3.5 - 5.1 mmol/L   Chloride 107 98 - 111 mmol/L   CO2 26 22 - 32 mmol/L   Glucose, Bld 92 70 - 99 mg/dL   BUN 11 6 - 20 mg/dL   Creatinine, Ser 1.23 0.61 - 1.24 mg/dL   Calcium 9.4 8.9 - 10.3 mg/dL   GFR calc non Af Amer >60 >60 mL/min   GFR calc Af Amer >60 >60 mL/min   Anion gap 7 5 - 15    Comment: Performed at [facility], [address] [place], [place] [address]    Blood Alcohol level:  Lab Results  Component Value Date   ETH 208 (H) 08/27/2018   ETH <10 24/23/5361    Metabolic Disorder Labs: Lab Results  Component Value Date   HGBA1C 5.4 08/27/2018   MPG 108.28 08/27/2018   MPG 100 10/02/2016   No  results found for: PROLACTIN Lab Results  Component Value Date   CHOL 438 (H) 08/27/2018   TRIG 855 (H) 08/27/2018   HDL 47 08/27/2018   CHOLHDL 9.3 08/27/2018   VLDL UNABLE TO CALCULATE IF TRIGLYCERIDE OVER 400 mg/dL 08/27/2018   LDLCALC UNABLE TO CALCULATE IF TRIGLYCERIDE OVER 400 mg/dL 08/27/2018   LDLCALC 87 03/03/2016    Physical Findings: AIMS:  , ,  ,  ,    CIWA:  CIWA-Ar Total: 0 COWS:     Musculoskeletal: Strength & Muscle Tone: within normal limits Gait & Station: normal Patient leans: N/A  Psychiatric Specialty Exam: Physical Exam  Nursing note and vitals reviewed. Constitutional: He appears well-developed and well-nourished.  HENT:  Head: Normocephalic and atraumatic.  Eyes: Pupils are equal, round, and reactive to light. Conjunctivae are normal.  Neck: Normal range of motion.  Cardiovascular: Regular rhythm and normal heart sounds.   Respiratory: Effort normal.  GI: Soft.  Musculoskeletal: Normal range of motion.  Neurological: He is alert.  Skin: Skin is warm and dry.  Psychiatric: He has a normal mood and affect. His behavior is normal. Judgment and thought content normal.    Review of Systems  Constitutional: Negative.   HENT: Negative.   Eyes: Negative.   Respiratory: Negative.   Cardiovascular: Negative.   Gastrointestinal: Negative.   Musculoskeletal: Negative.   Skin: Negative.   Neurological: Negative.   Psychiatric/Behavioral: Negative.     Blood pressure 130/79, pulse 81, temperature 98 F (36.7 C), temperature source Oral, resp. rate 18, height 6\' 2"  (1.88 m), weight 81.6 kg, SpO2 100 %.Body mass index is 23.1 kg/m.  General Appearance: Casual  Eye Contact:  Good  Speech:  Clear and Coherent  Volume:  Normal  Mood:  Euthymic  Affect:  Congruent  Thought Process:  Goal Directed  Orientation:  Full (Time, Place, and Person)  Thought Content:  Logical  Suicidal Thoughts:  No  Homicidal Thoughts:  No  Memory:  Immediate;   Fair Recent;   Fair Remote;   Fair  Judgement:  Fair  Insight:  Fair  Psychomotor Activity:  Normal  Concentration:  Concentration: Fair  Recall:  [employer] of Knowledge:  Fair  Language:  Fair  Akathisia:  No  Handed:  Right  AIMS (if indicated):     Assets:  Desire for Improvement Housing Physical Health Resilience  ADL's:  Intact  Cognition:  WNL  Sleep:  Number of Hours: 6.3     Treatment Plan Summary: Daily contact with patient to assess and evaluate symptoms and progress in treatment, Medication management and Plan No changes to medication today.  Preparations underway for likely discharge early in the week when we have a day set for going to the alcohol and drug abuse treatment center.  Supportive therapy and psychoeducation completed.  Alethia Berthold, MD 08/31/2018, 12:08 PM

## 2018-09-01 NOTE — Plan of Care (Signed)
D- Patient alert and oriented. Patient presents in a pleasant mood on assessment stating that he slept "great" last night and the only complaints he had to voice was of a stiff left shoulder, in which he had surgery on, rating the stiffness a "5/10". Patient requested some of his PRN cream from this Probation officer. Patient denies SI, HI, AVH at this time. Patient also denies any signs/symptoms of depression/axniety stating "not right now". Patient's goal for today is "best I can, work hard all day".  A- Scheduled medications administered to patient, per MD orders. Support and encouragement provided.  Routine safety checks conducted every 15 minutes.  Patient informed to notify staff with problems or concerns.  R- No adverse drug reactions noted. Patient contracts for safety at this time. Patient compliant with medications and treatment plan. Patient receptive, calm, and cooperative. Patient interacts well with others on the unit.  Patient remains safe at this time.  Problem: Education: Goal: Knowledge of [place] [facility] Education information/materials will improve Outcome: Progressing   Problem: Safety: Goal: Periods of time without injury will increase Outcome: Progressing   Problem: Education: Goal: Utilization of techniques to improve thought processes will improve Outcome: Progressing Goal: Knowledge of the prescribed therapeutic regimen will improve Outcome: Progressing   Problem: Safety: Goal: Ability to disclose and discuss suicidal ideas will improve Outcome: Progressing Goal: Ability to identify and utilize support systems that promote safety will improve Outcome: Progressing   Problem: Self-Concept: Goal: Level of anxiety will decrease Outcome: Progressing

## 2018-09-01 NOTE — Progress Notes (Signed)
[facility] MD Progress Note  09/01/2018 11:56 AM Tristan Cook  MRN:  [id number] Subjective: Follow-up for patient with depression and substance abuse.  No new complaints today.  Physically feeling fine.  Mood euthymic even upbeat.  Not voicing any paranoid or strange thoughts not showing any paranoid behavior.  No evidence of acute dangerousness.  Blood pressure stable.  Good insight.  Appropriate interactions. Principal Problem: MDD (major depressive disorder), recurrent, severe, with psychosis ([place]) Diagnosis: Principal Problem:   MDD (major depressive disorder), recurrent, severe, with psychosis ([place]) Active Problems:   Essential hypertension   Alcohol abuse  Total Time spent with patient: 20 minutes  Past Psychiatric History: Past history of depression symptoms and alcohol abuse  Past Medical History:  Past Medical History:  Diagnosis Date  . Abdominal pain   . Adenomatous colon polyp 2008  . Arthritis   . Back pain   . Bilateral lumbar radiculopathy   . Blurred vision, bilateral   . Chronic back pain   . Constipation   . Depression   . GERD (gastroesophageal reflux disease)   . Gout   . Hemorrhoids   . High cholesterol   . Hypertension   . Neck pain   . Neuropathy   . Neuropathy of both feet   . Numbness    rectal  . Numbness of legs   . Stroke ([place]) 2019  . Substance abuse ([place])    h/o excessive alcohol use; pt says he limits use to one to 2 beers daily he currently    Past Surgical History:  Procedure Laterality Date  . CATARACT EXTRACTION W/PHACO Right 08/16/2018   Procedure: CATARACT EXTRACTION PHACO AND INTRAOCULAR LENS PLACEMENT RIGHT EYE;  Surgeon: Baruch Goldmann, MD;  Location: [facility];  Service: Ophthalmology;  Laterality: Right;  CDE: 4.18  . COLONOSCOPY  10/09/2006   3 mm pedunculated sigmoid colon polyp removed/8-mm sessile hepatic flexure polyp (tubular villous adenoma) removed/6-mm  descending colon polyp removed/small internal hemorrhoids  . COLONOSCOPY   02/28/2011   GYF:VCBSWH, multiple in the rectum/Internal hemorrhoids, MODERATE-CAUSING RECTAL BLEEDING  . COLONOSCOPY N/A 07/05/2016   Procedure: COLONOSCOPY;  Surgeon: [name], MD;  Location: [facility];  Service: Endoscopy;  Laterality: N/A;  11:15am  . 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 Mild erosive gastrtitis most likely due to indomethacin   . FINGER SURGERY Right    4th and 5th digit  . FLEXIBLE SIGMOIDOSCOPY  01/02/2012   SLF: 1. the colonic mucosa appeared normal in the sigmoid colon 2. Large internal hemorrhoids: cause for rectal bleeding/pain . s/p banding X 3   . HAND SURGERY    . REVERSE SHOULDER ARTHROPLASTY Left 05/29/2018   Procedure: REVERSE SHOULDER ARTHROPLASTY;  Surgeon: Hiram Gash, MD;  Location: [place];  Service: Orthopedics;  Laterality: Left;  . SAVORY DILATION N/A 07/21/2014   Procedure: SAVORY DILATION;  Surgeon: [name], MD;  Location: [facility];  Service: Endoscopy;  Laterality: N/A;   Family History:  Family History  Problem Relation Age of Onset  . Hypertension Mother   . Hypertension Father   . Stroke Father   . Hypertension Sister   . Hypertension Brother   . Cancer Brother   . Cancer Brother        throat cancer  . Hypertension Brother   . Diabetes Maternal Grandmother   . Colon cancer Neg Hx    Family Psychiatric  History: None reported Social History:  Social History   Substance and Sexual Activity  Alcohol Use Yes  . Alcohol/week: 3.0 standard drinks  . Types: 3 Cans of beer per week   Comment: drinks 6 pack of beer on weekends     Social History   Substance and Sexual Activity  Drug Use No    Social History   Socioeconomic History  . Marital status: Single    Spouse name: Not on file  . Number of children: 0  . Years of education: 5  . Highest education level: Not on file  Occupational History  . Occupation: unemployed, Community education officer: UNEMPLOYED  Social Needs  . Financial resource strain: Not on file  . Food insecurity    Worry: Not on file    Inability: Not on file  . Transportation needs    Medical: No    Non-medical: No  Tobacco Use  . Smoking status: Former Smoker    Packs/day: 1.50    Years: 33.00    Pack years: 49.50    Types: Cigarettes    Quit date: 08/16/2004    Years since quitting: 14.0  . Smokeless tobacco: Current User    Types: Snuff  . Tobacco comment: Dips every once in a while  Substance and Sexual Activity  . Alcohol use: Yes    Alcohol/week: 3.0 standard drinks    Types: 3 Cans of beer per week    Comment: drinks 6 pack of beer on weekends  . Drug use: No  . Sexual activity: Not on file  Lifestyle  . Physical activity    Days per week: Not on file    Minutes per session: Not on file  . Stress: Not on file  Relationships  . Social Herbalist on phone: Not on file    Gets together: Not on file    Attends religious service: Not on file    Active member of club or organization: Not on file    Attends meetings of clubs or organizations: Not on file    Relationship status: Not on file  Other Topics Concern  . Not on file  Social History Narrative   Lives alone   No caffeine   Right handed   Additional Social History:                         Sleep: Fair  Appetite:  Fair  Current Medications: Current Facility-Administered Medications  Medication Dose Route Frequency Provider Last Rate Last Dose  . acetaminophen (TYLENOL) tablet 650 mg  650 mg Oral Q6H PRN Lavella Hammock, MD   650 mg at 08/30/18 2233  . alum & mag hydroxide-simeth (MAALOX/MYLANTA) 200-200-20 MG/5ML suspension 30 mL  30 mL Oral Q4H PRN Lavella Hammock, MD      . amLODipine (NORVASC) tablet 5 mg  5 mg Oral Daily Garry Bochicchio, Madie Reno, MD   5 mg at 09/01/18 0817  . aspirin EC tablet 81 mg  81 mg Oral Daily Lavella Hammock, MD   81 mg at 09/01/18 4098  . calcium-vitamin D (OSCAL WITH D)  500-200 MG-UNIT per tablet 1 tablet  1 tablet Oral Q breakfast Lavella Hammock, MD   1 tablet at 09/01/18 0815  . cholecalciferol (VITAMIN D3) tablet 400 Units  400 Units Oral Daily Lavella Hammock, MD   400 Units at 09/01/18 [phone]  . FLUoxetine (PROZAC) capsule 20 mg  20 mg Oral Daily  Lavella Hammock, MD   20 mg at 09/01/18 0818  . folic acid (FOLVITE) tablet 0.5 mg  500 mcg Oral Daily Lavella Hammock, MD   0.5 mg at 09/01/18 0817  . magnesium hydroxide (MILK OF MAGNESIA) suspension 30 mL  30 mL Oral Daily PRN Lavella Hammock, MD      . meloxicam [facility]) tablet 15 mg  15 mg Oral Daily Lavella Hammock, MD   15 mg at 09/01/18 0818  . metoprolol tartrate (LOPRESSOR) tablet 25 mg  25 mg Oral BID Lavella Hammock, MD   25 mg at 09/01/18 0818  . moxifloxacin (VIGAMOX) 0.5 % ophthalmic solution 1 drop  1 drop Both Eyes TID Shakevia Sarris T, MD      . multivitamin with minerals tablet 1 tablet  1 tablet Oral Daily Lavella Hammock, MD   1 tablet at 09/01/18 [phone]  . potassium chloride SA (K-DUR) CR tablet 20 mEq  20 mEq Oral BID Davon Folta, Madie Reno, MD   20 mEq at 09/01/18 0818  . prednisoLONE acetate (PRED FORTE) 1 % ophthalmic suspension 1 drop  1 drop Both Eyes QID Soo Steelman, Madie Reno, MD   1 drop at 09/01/18 0816  . QUEtiapine (SEROQUEL) tablet 100 mg  100 mg Oral QHS Adrain Butrick, Madie Reno, MD   100 mg at 08/31/18 2104  . thiamine (VITAMIN B-1) tablet 100 mg  100 mg Oral Daily Lavella Hammock, MD   100 mg at 09/01/18 6578   Or  . thiamine (B-1) injection 100 mg  100 mg Intravenous Daily Lavella Hammock, MD      . trolamine salicylate (ASPERCREME) 10 % cream   Topical BID PRN Amada Hallisey, Madie Reno, MD      . vitamin B-12 (CYANOCOBALAMIN) tablet 5,000 mcg  5,000 mcg Oral Daily Lavella Hammock, MD   5,000 mcg at 09/01/18 4696    Lab Results:  Results for orders placed or performed during the hospital encounter of 08/27/18 (from the past 48 hour(s))  Basic metabolic panel     Status: None   Collection Time:  08/31/18  7:39 AM  Result Value Ref Range   Sodium 140 135 - 145 mmol/L   Potassium 3.7 3.5 - 5.1 mmol/L   Chloride 107 98 - 111 mmol/L   CO2 26 22 - 32 mmol/L   Glucose, Bld 92 70 - 99 mg/dL   BUN 11 6 - 20 mg/dL   Creatinine, Ser 1.23 0.61 - 1.24 mg/dL   Calcium 9.4 8.9 - 10.3 mg/dL   GFR calc non Af Amer >60 >60 mL/min   GFR calc Af Amer >60 >60 mL/min   Anion gap 7 5 - 15    Comment: Performed at [facility], [address] [place], [place] [address]    Blood Alcohol level:  Lab Results  Component Value Date   ETH 208 (H) 08/27/2018   ETH <10 41/32/4401    Metabolic Disorder Labs: Lab Results  Component Value Date   HGBA1C 5.4 08/27/2018   MPG 108.28 08/27/2018   MPG 100 10/02/2016   No results found for: PROLACTIN Lab Results  Component Value Date   CHOL 438 (H) 08/27/2018   TRIG 855 (H) 08/27/2018   HDL 47 08/27/2018   CHOLHDL 9.3 08/27/2018   VLDL UNABLE TO CALCULATE IF TRIGLYCERIDE OVER 400 mg/dL 08/27/2018   LDLCALC UNABLE TO CALCULATE IF TRIGLYCERIDE OVER 400 mg/dL 08/27/2018   LDLCALC 87 03/03/2016    Physical Findings:  AIMS:  , ,  ,  ,    CIWA:  CIWA-Ar Total: 0 COWS:     Musculoskeletal: Strength & Muscle Tone: within normal limits Gait & Station: normal Patient leans: N/A  Psychiatric Specialty Exam: Physical Exam  Nursing note and vitals reviewed. Constitutional: He appears well-developed and well-nourished.  HENT:  Head: Normocephalic and atraumatic.  Eyes: Pupils are equal, round, and reactive to light. Conjunctivae are normal.  Neck: Normal range of motion.  Cardiovascular: Regular rhythm and normal heart sounds.  Respiratory: Effort normal. No respiratory distress.  GI: Soft.  Musculoskeletal: Normal range of motion.  Neurological: He is alert.  Skin: Skin is warm and dry.  Psychiatric: He has a normal mood and affect. His behavior is normal. Judgment and thought content normal.    Review of Systems  Constitutional:  Negative.   HENT: Negative.   Eyes: Negative.   Respiratory: Negative.   Cardiovascular: Negative.   Gastrointestinal: Negative.   Musculoskeletal: Negative.   Skin: Negative.   Neurological: Negative.   Psychiatric/Behavioral: Negative.     Blood pressure 123/81, pulse 80, temperature 99 F (37.2 C), temperature source Oral, resp. rate 17, height 6\' 2"  (1.88 m), weight 81.6 kg, SpO2 100 %.Body mass index is 23.1 kg/m.  General Appearance: Casual  Eye Contact:  Good  Speech:  Clear and Coherent  Volume:  Normal  Mood:  Euthymic  Affect:  Congruent  Thought Process:  Goal Directed  Orientation:  Full (Time, Place, and Person)  Thought Content:  Logical  Suicidal Thoughts:  No  Homicidal Thoughts:  No  Memory:  Immediate;   Fair Recent;   Fair Remote;   Fair  Judgement:  Fair  Insight:  Fair  Psychomotor Activity:  Normal  Concentration:  Concentration: Fair  Recall:  [employer] of Knowledge:  Fair  Language:  Fair  Akathisia:  No  Handed:  Right  AIMS (if indicated):     Assets:  Desire for Improvement Housing Physical Health Resilience  ADL's:  Intact  Cognition:  WNL  Sleep:  Number of Hours: 7.5     Treatment Plan Summary: Plan Patient's only complaint today was not being able to use his cell phone.  I offered him empathy and understanding.  Supportive therapy psychoeducation and review of treatment plan also completed.  Tomorrow we should discover when a bed will be available at the alcohol treatment center.  Alethia Berthold, MD 09/01/2018, 11:56 AM

## 2018-09-01 NOTE — Progress Notes (Addendum)
Patient stated to this writer that he would like some artificial tears eye drops as well as a stool softener. This Probation officer will notify MD.

## 2018-09-01 NOTE — Progress Notes (Signed)
D: Patient has been ambulating around unit without walker without difficulty. In dayroom socializing with peers. Denies SI, HI and AV hallucinations. Mood is pleasant. Affect appropriate to circumstance.  A: Continue to monitor for safety. R: Safety maintained.

## 2018-09-01 NOTE — Progress Notes (Signed)
Patient stated to this writer that he wants something for "the tingling in my feet". This Probation officer informed patient that MD would be notified.

## 2018-09-01 NOTE — [facility] Group Notes (Addendum)
LCSW Group Therapy Note  09/01/2018 9:09 AM   Type of Therapy and Topic: Group Therapy: Feelings Around Returning Home & Establishing a Supportive Framework and Supporting Oneself When Supports Not Available   Participation Level:  Minimal   Description of Group:  Patients first processed thoughts and feelings about upcoming discharge. These included fears of upcoming changes, lack of change, new living environments, judgements and expectations from others and overall stigma of mental health issues. The group then discussed the definition of a supportive framework, what that looks and feels like, and how do to discern it from an unhealthy non-supportive network. The group identified different types of supports as well as what to do when your family/friends are less than helpful or unavailable   Therapeutic Goals  1. Patient will identify one healthy supportive network that they can use at discharge. 2. Patient will identify one factor of a supportive framework and how to tell it from an unhealthy network. 3. Patient able to identify one coping skill to use when they do not have positive supports from others. 4. Patient will demonstrate ability to communicate their needs through discussion and/or role plays.   Summary of Patient Progress:  Pt was present in group for the first part and reported that he is feeling good about discharge because he knows he is making the changes he needs to make when he leaves the hospital. Pt expressed going to another facility (inpatient rehab for substance use) once he leaves this hospital.    Therapeutic Modalities Cognitive Behavioral [place], MSW, LCSW Clinical Social Work 09/01/2018 9:09 AM

## 2018-09-01 NOTE — Plan of Care (Signed)
  Problem: Education: Goal: Knowledge of [facility] Education information/materials will improve Outcome: Progressing  D: Patient has been ambulating around unit without walker without difficulty. In dayroom socializing with peers. Denies SI, HI and AV hallucinations. Mood is pleasant. Affect appropriate to circumstance.  A: Continue to monitor for safety. R: Safety maintained.

## 2018-09-02 MED ORDER — IBUPROFEN 600 MG PO TABS
800.0000 mg | ORAL_TABLET | Freq: Three times a day (TID) | ORAL | Status: DC | PRN
  Administered 2018-09-02 – 2018-09-04 (×4): 800 mg via ORAL
  Filled 2018-09-02 (×5): qty 1

## 2018-09-02 NOTE — Progress Notes (Signed)
CSW called [facility] admissions to follow up on bed date for pt. Admissions reported pt can come in Thursday at [facility] for admission.  Evalina Field, MSW, LCSW Clinical Social Work 09/02/2018 9:56 AM

## 2018-09-02 NOTE — Progress Notes (Signed)
Recreation Therapy Notes    Date: 09/02/2018  Time: 9:30 am  Location: Craft room  Behavioral response: Agitated, Appropriate  Intervention Topic: Problem Solving  Discussion/Intervention:  Group content on today was focused on problem solving. The group described what problem solving is. Patients expressed how problems affect them and how they deal with problems. Individuals identified healthy ways to deal with problems. Patients explained what normally happens to them when they do not deal with problems. The group expressed reoccurring problems for them. The group participated in the intervention "Ways to Solve problems" where patients were given a chance to explore different ways to solve problems.  Clinical Observations/Feedback:  Patient came to group and defined problem solving as communicating with someone. He expressed that he likes to try and work through his problems. Individual was social with peers and staff while participating in the intervention. Deoni Cosey LRT/CTRS         Lafaye Mcelmurry 09/02/2018 10:49 AM

## 2018-09-02 NOTE — Plan of Care (Signed)
  Problem: Safety: Goal: Ability to disclose and discuss suicidal ideas will improve Outcome: Progressing Goal: Ability to identify and utilize support systems that promote safety will improve Outcome: Progressing   D: Patient has been out on the unit, socializing with staff and peers. Interacting appropriately. Denies SI, HI and AVH. Mood is pleasant. Affect is appropriate to circumstance. Complains of mild intermittent arthritis pain. Medicated per prn order with relief. No other complaints. Has been ambulating without difficulty without using his walker. A: Continue to monitor for safety. R: Safety maintained.

## 2018-09-02 NOTE — [facility] Group Notes (Signed)
Overcoming Obstacles  09/02/2018 1PM  Type of Therapy and Topic:  Group Therapy:  Overcoming Obstacles  Participation Level:  Active    Description of Group:    In this group patients will be encouraged to explore what they see as obstacles to their own wellness and recovery. They will be guided to discuss their thoughts, feelings, and behaviors related to these obstacles. The group will process together ways to cope with barriers, with attention given to specific choices patients can make. Each patient will be challenged to identify changes they are motivated to make in order to overcome their obstacles. This group will be process-oriented, with patients participating in exploration of their own experiences as well as giving and receiving support and challenge from other group members.   Therapeutic Goals: 1. Patient will identify personal and current obstacles as they relate to admission. 2. Patient will identify barriers that currently interfere with their wellness or overcoming obstacles.  3. Patient will identify feelings, thought process and behaviors related to these barriers. 4. Patient will identify two changes they are willing to make to overcome these obstacles:      Summary of Patient Progress Pt interacted appropriately with group members and was receptive to feedback. Pt identified his physical health as an obstacle he would like to overcome. Pt demonstrated good insight and respected boundaries during session.    Therapeutic Modalities:   Cognitive Behavioral Therapy Solution Focused Therapy Motivational Interviewing Relapse Prevention Therapy    Sanjuana Kava, MSW, LCSW 09/02/2018 2:03 PM

## 2018-09-02 NOTE — Progress Notes (Signed)
D: Patient has been out on the unit, socializing with staff and peers. Interacting appropriately. Denies SI, HI and AVH. Mood is pleasant. Affect is appropriate to circumstance. Complains of mild intermittent arthritis pain. Medicated per prn order with relief. No other complaints. Has been ambulating without difficulty without using his walker. A: Continue to monitor for safety. R: Safety maintained.

## 2018-09-02 NOTE — Tx Team (Signed)
Interdisciplinary Treatment and Diagnostic Plan Update  09/02/2018 Time of Session: 830am Tristan Cook MRN: [id number]  Principal Diagnosis: MDD (major depressive disorder), recurrent, severe, with psychosis (Tristan Cook)  Secondary Diagnoses: Principal Problem:   MDD (major depressive disorder), recurrent, severe, with psychosis (Tristan Cook) Active Problems:   Essential hypertension   Alcohol abuse   Current Medications:  Current Facility-Administered Medications  Medication Dose Route Frequency Provider Last Rate Last Dose  . acetaminophen (TYLENOL) tablet 650 mg  650 mg Oral Q6H PRN Lavella Hammock, MD   650 mg at 09/01/18 1957  . alum & mag hydroxide-simeth (MAALOX/MYLANTA) 200-200-20 MG/5ML suspension 30 mL  30 mL Oral Q4H PRN Lavella Hammock, MD      . amLODipine (NORVASC) tablet 5 mg  5 mg Oral Daily Clapacs, Madie Reno, MD   5 mg at 09/02/18 0806  . aspirin EC tablet 81 mg  81 mg Oral Daily Lavella Hammock, MD   81 mg at 09/02/18 4696  . calcium-vitamin D (OSCAL WITH D) 500-200 MG-UNIT per tablet 1 tablet  1 tablet Oral Q breakfast Lavella Hammock, MD   1 tablet at 09/02/18 0805  . cholecalciferol (VITAMIN D3) tablet 400 Units  400 Units Oral Daily Lavella Hammock, MD   400 Units at 09/02/18 0805  . FLUoxetine (PROZAC) capsule 20 mg  20 mg Oral Daily Lavella Hammock, MD   20 mg at 09/02/18 0806  . folic acid (FOLVITE) tablet 0.5 mg  500 mcg Oral Daily Lavella Hammock, MD   0.5 mg at 09/02/18 2952  . magnesium hydroxide (MILK OF MAGNESIA) suspension 30 mL  30 mL Oral Daily PRN Lavella Hammock, MD   30 mL at 09/01/18 1232  . meloxicam (MOBIC) tablet 15 mg  15 mg Oral Daily Lavella Hammock, MD   15 mg at 09/02/18 0806  . metoprolol tartrate (LOPRESSOR) tablet 25 mg  25 mg Oral BID Lavella Hammock, MD   25 mg at 09/02/18 0806  . moxifloxacin (VIGAMOX) 0.5 % ophthalmic solution 1 drop  1 drop Both Eyes TID Clapacs, John T, MD      . multivitamin with minerals tablet 1 tablet  1 tablet Oral  Daily Lavella Hammock, MD   1 tablet at 09/02/18 [phone]  . potassium chloride SA (K-DUR) CR tablet 20 mEq  20 mEq Oral BID Clapacs, Madie Reno, MD   20 mEq at 09/02/18 0806  . prednisoLONE acetate (PRED FORTE) 1 % ophthalmic suspension 1 drop  1 drop Both Eyes QID Clapacs, Madie Reno, MD   1 drop at 09/02/18 0805  . QUEtiapine (SEROQUEL) tablet 100 mg  100 mg Oral QHS Clapacs, John T, MD   100 mg at 09/01/18 2057  . thiamine (VITAMIN B-1) tablet 100 mg  100 mg Oral Daily Lavella Hammock, MD   100 mg at 09/02/18 2440   Or  . thiamine (B-1) injection 100 mg  100 mg Intravenous Daily Lavella Hammock, MD      . trolamine salicylate (ASPERCREME) 10 % cream   Topical BID PRN Clapacs, Madie Reno, MD      . vitamin B-12 (CYANOCOBALAMIN) tablet 5,000 mcg  5,000 mcg Oral Daily Lavella Hammock, MD   5,000 mcg at 09/02/18 0805   PTA Medications: Medications Prior to Admission  Medication Sig Dispense Refill Last Dose  . aspirin EC 81 MG tablet Take 81 mg by mouth daily.      . calcium-vitamin D (CALCIUM  500/D) 500-200 MG-UNIT tablet Take 1 tablet by mouth daily with breakfast.     . cholecalciferol (VITAMIN D-400) 10 MCG (400 UNIT) TABS tablet Take 400 Units by mouth daily.     . folic acid (FOLVITE) 101 MCG tablet Take 400 mcg by mouth daily.     . meloxicam (MOBIC) 15 MG tablet Take 15 mg by mouth daily.     . metoprolol tartrate (LOPRESSOR) 50 MG tablet Take 25 mg by mouth 2 (two) times daily.     [name] moxifloxacin (VIGAMOX) 0.5 % ophthalmic solution Place 1 drop into both eyes 3 (three) times daily. In operative eye only     . Multiple Vitamin (MULTIVITAMIN WITH MINERALS) TABS tablet Take 1 tablet by mouth daily.     . prednisoLONE acetate (PRED FORTE) 1 % ophthalmic suspension Place 1 drop into both eyes 4 (four) times daily. In operative eye only     . vitamin B-12 (CYANOCOBALAMIN) 1000 MCG tablet Take 5,000 mcg by mouth daily.        Patient Stressors: Medication change or noncompliance Substance  abuse  Patient Strengths: Motivation for treatment/growth Work skills  Treatment Modalities: Medication Management, Group therapy, Case management,  1 to 1 session with clinician, Psychoeducation, Recreational therapy.   Physician Treatment Plan for Primary Diagnosis: MDD (major depressive disorder), recurrent, severe, with psychosis (Tristan Cook) Long Term Goal(s): Improvement in symptoms so as ready for discharge Improvement in symptoms so as ready for discharge   Short Term Goals: Ability to demonstrate self-control will improve Ability to identify and develop effective coping behaviors will improve Ability to identify triggers associated with substance abuse/mental health issues will improve  Medication Management: Evaluate patient's response, side effects, and tolerance of medication regimen.  Therapeutic Interventions: 1 to 1 sessions, Unit Group sessions and Medication administration.  Evaluation of Outcomes: Progressing  Physician Treatment Plan for Secondary Diagnosis: Principal Problem:   MDD (major depressive disorder), recurrent, severe, with psychosis (Tristan Cook) Active Problems:   Essential hypertension   Alcohol abuse  Long Term Goal(s): Improvement in symptoms so as ready for discharge Improvement in symptoms so as ready for discharge   Short Term Goals: Ability to demonstrate self-control will improve Ability to identify and develop effective coping behaviors will improve Ability to identify triggers associated with substance abuse/mental health issues will improve     Medication Management: Evaluate patient's response, side effects, and tolerance of medication regimen.  Therapeutic Interventions: 1 to 1 sessions, Unit Group sessions and Medication administration.  Evaluation of Outcomes: Progressing   RN Treatment Plan for Primary Diagnosis: MDD (major depressive disorder), recurrent, severe, with psychosis (Tristan Cook) Long Term Goal(s): Knowledge of disease and therapeutic  regimen to maintain health will improve  Short Term Goals: Ability to verbalize frustration and anger appropriately will improve, Ability to demonstrate self-control, Ability to participate in decision making will improve, Ability to disclose and discuss suicidal ideas and Ability to identify and develop effective coping behaviors will improve  Medication Management: RN will administer medications as ordered by provider, will assess and evaluate patient's response and provide education to patient for prescribed medication. RN will report any adverse and/or side effects to prescribing provider.  Therapeutic Interventions: 1 on 1 counseling sessions, Psychoeducation, Medication administration, Evaluate responses to treatment, Monitor vital signs and CBGs as ordered, Perform/monitor CIWA, COWS, AIMS and Fall Risk screenings as ordered, Perform wound care treatments as ordered.  Evaluation of Outcomes: Progressing   LCSW Treatment Plan for Primary Diagnosis: MDD (major depressive disorder), recurrent, severe,  with psychosis ([place]) Long Term Goal(s): Safe transition to appropriate next level of care at discharge, Engage patient in therapeutic group addressing interpersonal concerns.  Short Term Goals: Engage patient in aftercare planning with referrals and resources, Increase social support, Increase ability to appropriately verbalize feelings, Increase emotional regulation and Facilitate patient progression through stages of change regarding substance use diagnoses and concerns  Therapeutic Interventions: Assess for all discharge needs, 1 to 1 time with Social worker, Explore available resources and support systems, Assess for adequacy in community support network, Educate family and significant other(s) on suicide prevention, Complete Psychosocial Assessment, Interpersonal group therapy.  Evaluation of Outcomes: Progressing   Progress in Treatment: Attending groups: Yes. Participating in groups:  Yes. Taking medication as prescribed: Yes. Toleration medication: Yes. Family/Significant other contact made: Yes, individual(s) contacted:  SPE completed with pt's girlfriend. Patient understands diagnosis: Yes. Discussing patient identified problems/goals with staff: Yes. Medical problems stabilized or resolved: Yes. Denies suicidal/homicidal ideation: Yes. Issues/concerns per patient self-inventory: No. Other: none  New problem(s) identified: No, Describe:  none  New Short Term/Long Term Goal(s): medication management for mood stabilization;development of comprehensive mental wellness/sobriety plan.  Patient Goals:  "to get off it [alcohol]"  Discharge Plan or Barriers: Patient wants a referral to [facility] or [facility].  Patient reports that he has a home.  UPDATE 09/02/18: SPE pamphlet, Mobile Crisis information, and AA/NA information provided to patient for additional community support and resources. Pt has been accepted at [facility] and is currently waiting for a bed date.  Reason for Continuation of Hospitalization: Medication stabilization  Estimated Length of Stay: 1-5 days  Recreational Therapy: Patient Stressors: N/A  Patient Goal: Patient will engage in groups without prompting or encouragement from LRT x3 group sessions within 5 recreation therapy group sessions  Attendees: Patient: Tristan Cook 09/02/2018 9:46 AM  Physician:  09/02/2018 9:46 AM  Nursing:  09/02/2018 9:46 AM  RN Care Manager: 09/02/2018 9:46 AM  Social Worker: Vivianne Spence, [place] 09/02/2018 9:46 AM  Recreational Therapist:  09/02/2018 9:46 AM  Other:  09/02/2018 9:46 AM  Other:  09/02/2018 9:46 AM  Other: 09/02/2018 9:46 AM    Scribe for Treatment Team: Mariann Laster Zohar Maroney, LCSW 09/02/2018 9:46 AM

## 2018-09-02 NOTE — Plan of Care (Signed)
D- Patient alert and oriented. Patient presents in a pleasant mood on assessment stating that he slept good last night and reported that he had a dream about his pets. Patient only complaint was of bilateral knee and left shoulder pain, in which he rated a "5/10" and "4/10", however, he did not request any additional medication from this Probation officer. Patient rated a "1/10" depression/anxiety level, however, he did not endorse any of this to this Probation officer. Patient denies SI, HI, AVH at this time. Patient's goal for today is "to get walking better", in which he will "exercise" in order to accomplish this goal.  A- Scheduled medications administered to patient, per MD orders. Support and encouragement provided.  Routine safety checks conducted every 15 minutes.  Patient informed to notify staff with problems or concerns.  R- No adverse drug reactions noted. Patient contracts for safety at this time. Patient compliant with medications and treatment plan. Patient receptive, calm, and cooperative. Patient interacts well with others on the unit.  Patient remains safe at this time.  Problem: Education: Goal: Knowledge of [facility] Education information/materials will improve Outcome: Progressing   Problem: Safety: Goal: Periods of time without injury will increase Outcome: Progressing   Problem: Education: Goal: Utilization of techniques to improve thought processes will improve Outcome: Progressing Goal: Knowledge of the prescribed therapeutic regimen will improve Outcome: Progressing   Problem: Safety: Goal: Ability to disclose and discuss suicidal ideas will improve Outcome: Progressing Goal: Ability to identify and utilize support systems that promote safety will improve Outcome: Progressing   Problem: Self-Concept: Goal: Level of anxiety will decrease Outcome: Progressing

## 2018-09-03 ENCOUNTER — Other Ambulatory Visit (HOSPITAL_COMMUNITY)
Admission: RE | Admit: 2018-09-03 | Discharge: 2018-09-03 | Disposition: A | Discharge status: Home / Self Care | Payer: Medicare Other | Source: Ambulatory Visit | Attending: Ophthalmology | Admitting: Ophthalmology

## 2018-09-03 MED ORDER — POTASSIUM CHLORIDE CRYS ER 20 MEQ PO TBCR: 20.0000 meq | EXTENDED_RELEASE_TABLET | Freq: Two times a day (BID) | ORAL | 0 refills | Status: DC

## 2018-09-03 MED ORDER — QUETIAPINE FUMARATE 100 MG PO TABS: 100.0000 mg | ORAL_TABLET | Freq: Every day | ORAL | 0 refills | Status: DC

## 2018-09-03 MED ORDER — POLYVINYL ALCOHOL 1.4 % OP SOLN
2.0000 [drp] | Freq: Four times a day (QID) | OPHTHALMIC | Status: DC | PRN
  Administered 2018-09-03 – 2018-09-04 (×2): 2 [drp] via OPHTHALMIC
  Filled 2018-09-03: qty 15

## 2018-09-03 MED ORDER — AMLODIPINE BESYLATE 5 MG PO TABS: 5.0000 mg | ORAL_TABLET | Freq: Every day | ORAL | 0 refills | Status: DC

## 2018-09-03 MED ORDER — ASPIRIN EC 81 MG PO TBEC: 81.0000 mg | DELAYED_RELEASE_TABLET | Freq: Every day | ORAL | 0 refills | Status: DC

## 2018-09-03 MED ORDER — VITAMIN B-12 1000 MCG PO TABS: 5000.0000 ug | ORAL_TABLET | Freq: Every day | ORAL | 0 refills | Status: DC

## 2018-09-03 MED ORDER — DOCUSATE SODIUM 100 MG PO CAPS: 100.0000 mg | ORAL_CAPSULE | Freq: Two times a day (BID) | ORAL | 0 refills | Status: DC

## 2018-09-03 MED ORDER — METOPROLOL TARTRATE 25 MG PO TABS: 25.0000 mg | ORAL_TABLET | Freq: Two times a day (BID) | ORAL | 0 refills | Status: DC

## 2018-09-03 MED ORDER — DOCUSATE SODIUM 100 MG PO CAPS
100.0000 mg | ORAL_CAPSULE | Freq: Two times a day (BID) | ORAL | Status: DC
  Administered 2018-09-03 – 2018-09-05 (×5): 100 mg via ORAL
  Filled 2018-09-03 (×5): qty 1

## 2018-09-03 MED ORDER — FLUOXETINE HCL 20 MG PO CAPS: 20.0000 mg | ORAL_CAPSULE | Freq: Every day | ORAL | 0 refills | Status: DC

## 2018-09-03 MED ORDER — MELOXICAM 15 MG PO TABS: 15.0000 mg | ORAL_TABLET | Freq: Every day | ORAL | 0 refills | Status: DC

## 2018-09-03 NOTE — Plan of Care (Signed)
  Problem: Education: Goal: Utilization of techniques to improve thought processes will improve Outcome: Progressing Goal: Knowledge of the prescribed therapeutic regimen will improve Outcome: Progressing  D: Patient denies SI, HI and AVH. Medicated for arthritis pain per prn order. Mood is pleasant. Affect appropriate to circumstance. A: Continue to monitor for safety. R: Safety maintained.

## 2018-09-03 NOTE — Progress Notes (Signed)
Recreation Therapy Notes   Date: 09/03/2018  Time: 9:30 am  Location: Craft room  Behavioral response: Appropriate  Intervention Topic: Goals  Discussion/Intervention:  Group content on today was focused on goals. Patients described what goals are and how they define goals. Individuals expressed how they go about setting goals and reaching them. The group identified how important goals are and if they make short term goals to reach long term goals. Patients described how many goals they work on at a time and what affects them not reaching their goal. Individuals described how much time they put into planning and obtaining their goals. The group participated in the intervention "My Goal Board" and made personal goal boards to help them achieve their goal. Clinical Observations/Feedback:  Patient came to group and put his head down. He did not contribute to the group discussion. Individual was social with staff while participating in activity.  Starnisha Batrez LRT/CTRS         Ledonna Dormer 09/03/2018 11:05 AM

## 2018-09-03 NOTE — Progress Notes (Signed)
[facility] MD Progress Note  09/03/2018 3:45 PM Tristan Cook  MRN:  [id number] Subjective: Patient seen.  Patient complains of some soreness in his knees but that has responded to Motrin.  Having some dry eyes and constipation as well.  Emotionally he says he is anxious today because he is not sure if he is going to be able to get back into the place where he was living.  Asks to use his phone.  Otherwise he is still on track for an agreeable to inpatient substance abuse treatment Thursday Principal Problem: MDD (major depressive disorder), recurrent, severe, with psychosis ([place]) Diagnosis: Principal Problem:   MDD (major depressive disorder), recurrent, severe, with psychosis ([place]) Active Problems:   Essential hypertension   Alcohol abuse  Total Time spent with patient: 20 minutes  Past Psychiatric History: No prior psychiatric treatment  Past Medical History:  Past Medical History:  Diagnosis Date  . Abdominal pain   . Adenomatous colon polyp 2008  . Arthritis   . Back pain   . Bilateral lumbar radiculopathy   . Blurred vision, bilateral   . Chronic back pain   . Constipation   . Depression   . GERD (gastroesophageal reflux disease)   . Gout   . Hemorrhoids   . High cholesterol   . Hypertension   . Neck pain   . Neuropathy   . Neuropathy of both feet   . Numbness    rectal  . Numbness of legs   . Stroke ([place]) 2019  . Substance abuse ([place])    h/o excessive alcohol use; pt says he limits use to one to 2 beers daily he currently    Past Surgical History:  Procedure Laterality Date  . CATARACT EXTRACTION W/PHACO Right 08/16/2018   Procedure: CATARACT EXTRACTION PHACO AND INTRAOCULAR LENS PLACEMENT RIGHT EYE;  Surgeon: Baruch Goldmann, MD;  Location: [facility];  Service: Ophthalmology;  Laterality: Right;  CDE: 4.18  . COLONOSCOPY  10/09/2006   3 mm pedunculated sigmoid colon polyp removed/8-mm sessile hepatic flexure polyp (tubular villous adenoma) removed/6-mm  descending colon  polyp removed/small internal hemorrhoids  . COLONOSCOPY  02/28/2011   IRW:ERXVQM, multiple in the rectum/Internal hemorrhoids, MODERATE-CAUSING RECTAL BLEEDING  . COLONOSCOPY N/A 07/05/2016   Procedure: COLONOSCOPY;  Surgeon: [name], MD;  Location: [facility];  Service: Endoscopy;  Laterality: N/A;  11:15am  . 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 Mild erosive gastrtitis most likely due to indomethacin   . FINGER SURGERY Right    4th and 5th digit  . FLEXIBLE SIGMOIDOSCOPY  01/02/2012   SLF: 1. the colonic mucosa appeared normal in the sigmoid colon 2. Large internal hemorrhoids: cause for rectal bleeding/pain . s/p banding X 3   . HAND SURGERY    . REVERSE SHOULDER ARTHROPLASTY Left 05/29/2018   Procedure: REVERSE SHOULDER ARTHROPLASTY;  Surgeon: Hiram Gash, MD;  Location: [place];  Service: Orthopedics;  Laterality: Left;  . SAVORY DILATION N/A 07/21/2014   Procedure: SAVORY DILATION;  Surgeon: [name], MD;  Location: [facility];  Service: Endoscopy;  Laterality: N/A;   Family History:  Family History  Problem Relation Age of Onset  . Hypertension Mother   . Hypertension Father   . Stroke Father   . Hypertension Sister   . Hypertension Brother   . Cancer Brother   . Cancer Brother        throat cancer  . Hypertension Brother   .  Diabetes Maternal Grandmother   . Colon cancer Neg Hx    Family Psychiatric  History: None Social History:  Social History   Substance and Sexual Activity  Alcohol Use Yes  . Alcohol/week: 3.0 standard drinks  . Types: 3 Cans of beer per week   Comment: drinks 6 pack of beer on weekends     Social History   Substance and Sexual Activity  Drug Use No    Social History   Socioeconomic History  . Marital status: Single    Spouse name: Not on file  . Number of children: 0  . Years of education: 17  . Highest education level: Not on file  Occupational History   . Occupation: unemployed, Retail buyer: UNEMPLOYED  Social Needs  . Financial resource strain: Not on file  . Food insecurity    Worry: Not on file    Inability: Not on file  . Transportation needs    Medical: No    Non-medical: No  Tobacco Use  . Smoking status: Former Smoker    Packs/day: 1.50    Years: 33.00    Pack years: 49.50    Types: Cigarettes    Quit date: 08/16/2004    Years since quitting: 14.0  . Smokeless tobacco: Current User    Types: Snuff  . Tobacco comment: Dips every once in a while  Substance and Sexual Activity  . Alcohol use: Yes    Alcohol/week: 3.0 standard drinks    Types: 3 Cans of beer per week    Comment: drinks 6 pack of beer on weekends  . Drug use: No  . Sexual activity: Not on file  Lifestyle  . Physical activity    Days per week: Not on file    Minutes per session: Not on file  . Stress: Not on file  Relationships  . Social Herbalist on phone: Not on file    Gets together: Not on file    Attends religious service: Not on file    Active member of club or organization: Not on file    Attends meetings of clubs or organizations: Not on file    Relationship status: Not on file  Other Topics Concern  . Not on file  Social History Narrative   Lives alone   No caffeine   Right handed   Additional Social History:                         Sleep: Good  Appetite:  Good  Current Medications: Current Facility-Administered Medications  Medication Dose Route Frequency Provider Last Rate Last Dose  . acetaminophen (TYLENOL) tablet 650 mg  650 mg Oral Q6H PRN Lavella Hammock, MD   650 mg at 09/01/18 1957  . alum & mag hydroxide-simeth (MAALOX/MYLANTA) 200-200-20 MG/5ML suspension 30 mL  30 mL Oral Q4H PRN Lavella Hammock, MD      . amLODipine (NORVASC) tablet 5 mg  5 mg Oral Daily Denson Niccoli, Madie Reno, MD   5 mg at 09/03/18 0816  . aspirin EC tablet 81 mg  81 mg Oral Daily Lavella Hammock, MD   81 mg at  09/03/18 0816  . calcium-vitamin D (OSCAL WITH D) 500-200 MG-UNIT per tablet 1 tablet  1 tablet Oral Q breakfast Lavella Hammock, MD   1 tablet at 09/03/18 [phone]  . cholecalciferol (VITAMIN D3) tablet 400 Units  400 Units Oral Daily Melba Coon  M, MD   400 Units at 09/03/18 0817  . docusate sodium (COLACE) capsule 100 mg  100 mg Oral BID Syana Degraffenreid, Madie Reno, MD   100 mg at 09/03/18 1610  . FLUoxetine (PROZAC) capsule 20 mg  20 mg Oral Daily Lavella Hammock, MD   20 mg at 09/03/18 0816  . folic acid (FOLVITE) tablet 0.5 mg  500 mcg Oral Daily Lavella Hammock, MD   0.5 mg at 09/03/18 0816  . ibuprofen (ADVIL) tablet 800 mg  800 mg Oral Q8H PRN Jaylia Pettus, Madie Reno, MD   800 mg at 09/03/18 0815  . magnesium hydroxide (MILK OF MAGNESIA) suspension 30 mL  30 mL Oral Daily PRN Lavella Hammock, MD   30 mL at 09/01/18 1232  . meloxicam (MOBIC) tablet 15 mg  15 mg Oral Daily Lavella Hammock, MD   15 mg at 09/03/18 0815  . metoprolol tartrate (LOPRESSOR) tablet 25 mg  25 mg Oral BID Lavella Hammock, MD   25 mg at 09/03/18 0816  . moxifloxacin (VIGAMOX) 0.5 % ophthalmic solution 1 drop  1 drop Both Eyes TID Jodeci Roarty T, MD      . multivitamin with minerals tablet 1 tablet  1 tablet Oral Daily Lavella Hammock, MD   1 tablet at 09/03/18 [phone]  . polyvinyl alcohol (LIQUIFILM TEARS) 1.4 % ophthalmic solution 2 drop  2 drop Both Eyes QID PRN Tarryn Bogdan T, MD      . potassium chloride SA (K-DUR) CR tablet 20 mEq  20 mEq Oral BID Omeed Osuna, Madie Reno, MD   20 mEq at 09/03/18 0816  . prednisoLONE acetate (PRED FORTE) 1 % ophthalmic suspension 1 drop  1 drop Both Eyes QID Gracelin Weisberg, Madie Reno, MD   1 drop at 09/03/18 1158  . QUEtiapine (SEROQUEL) tablet 100 mg  100 mg Oral QHS Lasheika Ortloff T, MD   100 mg at 09/02/18 2121  . thiamine (VITAMIN B-1) tablet 100 mg  100 mg Oral Daily Lavella Hammock, MD   100 mg at 09/03/18 5409   Or  . thiamine (B-1) injection 100 mg  100 mg Intravenous Daily Lavella Hammock, MD      .  trolamine salicylate (ASPERCREME) 10 % cream   Topical BID PRN Olanda Downie, Madie Reno, MD      . vitamin B-12 (CYANOCOBALAMIN) tablet 5,000 mcg  5,000 mcg Oral Daily Lavella Hammock, MD   5,000 mcg at 09/03/18 8119    Lab Results: No results found for this or any previous visit (from the past 48 hour(s)).  Blood Alcohol level:  Lab Results  Component Value Date   ETH 208 (H) 08/27/2018   ETH <10 14/78/2956    Metabolic Disorder Labs: Lab Results  Component Value Date   HGBA1C 5.4 08/27/2018   MPG 108.28 08/27/2018   MPG 100 10/02/2016   No results found for: PROLACTIN Lab Results  Component Value Date   CHOL 438 (H) 08/27/2018   TRIG 855 (H) 08/27/2018   HDL 47 08/27/2018   CHOLHDL 9.3 08/27/2018   VLDL UNABLE TO CALCULATE IF TRIGLYCERIDE OVER 400 mg/dL 08/27/2018   LDLCALC UNABLE TO CALCULATE IF TRIGLYCERIDE OVER 400 mg/dL 08/27/2018   LDLCALC 87 03/03/2016    Physical Findings: AIMS:  , ,  ,  ,    CIWA:  CIWA-Ar Total: 0 COWS:     Musculoskeletal: Strength & Muscle Tone: within normal limits Gait & Station: normal Patient leans: N/A  Psychiatric Specialty  Exam: Physical Exam  Nursing note and vitals reviewed. Constitutional: He appears well-developed and well-nourished.  HENT:  Head: Normocephalic and atraumatic.  Eyes: Pupils are equal, round, and reactive to light. Conjunctivae are normal.  Neck: Normal range of motion.  Cardiovascular: Regular rhythm and normal heart sounds.  Respiratory: Effort normal.  GI: Soft.  Musculoskeletal: Normal range of motion.  Neurological: He is alert.  Skin: Skin is warm and dry.  Psychiatric: He has a normal mood and affect. His speech is normal and behavior is normal. Judgment and thought content normal. Cognition and memory are normal.    Review of Systems  Constitutional: Negative.   HENT: Negative.   Eyes: Negative.   Respiratory: Negative.   Cardiovascular: Negative.   Gastrointestinal: Negative.   Musculoskeletal:  Negative.   Skin: Negative.   Neurological: Negative.   Psychiatric/Behavioral: Negative.     Blood pressure 119/76, pulse 73, temperature 98.2 F (36.8 C), temperature source Oral, resp. rate 15, height 6\' 2"  (1.88 m), weight 81.6 kg, SpO2 100 %.Body mass index is 23.1 kg/m.  General Appearance: Casual  Eye Contact:  Good  Speech:  Clear and Coherent  Volume:  Normal  Mood:  Euthymic  Affect:  Congruent  Thought Process:  Goal Directed  Orientation:  Full (Time, Place, and Person)  Thought Content:  Logical  Suicidal Thoughts:  No  Homicidal Thoughts:  No  Memory:  Immediate;   Fair Recent;   Fair Remote;   Fair  Judgement:  Fair  Insight:  Fair  Psychomotor Activity:  Normal  Concentration:  Concentration: Fair  Recall:  [employer] of Knowledge:  Fair  Language:  Fair  Akathisia:  No  Handed:  Right  AIMS (if indicated):     Assets:  Desire for Improvement Resilience  ADL's:  Impaired  Cognition:  Impaired,  Mild  Sleep:  Number of Hours: 7     Treatment Plan Summary: Daily contact with patient to assess and evaluate symptoms and progress in treatment, Medication management and Plan Patient continues to be mentally and physically stable.  Patient can use his cell phone to make some calls to numbers but we had agreed on a plan for discharge tomorrow.  Prescriptions will be prepared.  Treatment team aware.  He knows that he has an appointment for admission on Thursday.  Alethia Berthold, MD 09/03/2018, 3:45 PM

## 2018-09-03 NOTE — BHH Group Notes (Signed)
  LCSW Group Therapy Note  09/03/2018 12:52 PM   Type of Therapy/Topic:  Group Therapy:  Feelings about Diagnosis  Participation Level:  Active   Description of Group:   This group will allow patients to explore their thoughts and feelings about diagnoses they have received. Patients will be guided to explore their level of understanding and acceptance of these diagnoses. Facilitator will encourage patients to process their thoughts and feelings about the reactions of others to their diagnosis and will guide patients in identifying ways to discuss their diagnosis with significant others in their lives. This group will be process-oriented, with patients participating in exploration of their own experiences, giving and receiving support, and processing challenge from other group members.   Therapeutic Goals: 1. Patient will demonstrate understanding of diagnosis as evidenced by identifying two or more symptoms of the disorder 2. Patient will be able to express two feelings regarding the diagnosis 3. Patient will demonstrate their ability to communicate their needs through discussion and/or role play  Summary of Patient Progress: Pt was appropriate and respectful in group. Pt was able to report that he knows what his diagnosis is and he agrees with it. Pt discussed having a problem with drinking and that he only drinks beer and does not mix it or use other substances. Pt reported that he does not really have a support system and that it is just him. Pt identified a few coping skills and discussed plans to go to rehab.   Therapeutic Modalities:   Cognitive Behavioral Therapy Brief Therapy Feelings Identification    Evalina Field, MSW, LCSW Clinical Social Work 09/03/2018 12:52 PM

## 2018-09-03 NOTE — Progress Notes (Signed)
D: Patient denies SI, HI and AVH. Medicated for arthritis pain per prn order. Mood is pleasant. Affect appropriate to circumstance. A: Continue to monitor for safety. R: Safety maintained.

## 2018-09-03 NOTE — Plan of Care (Signed)
D- Patient alert and oriented. Patient presents in a pleasant mood on assessment stating that he slept good last night and reported that he had a dream about his pets. Patient" only continued complaint was of bilateral knee and left shoulder pain, in which he rated a "5/10" and "4/10", and he did request medication from this Probation officer. Patient rated a "1/10" depression/anxiety level, however, he did not endorse any of this to this Probation officer. Patient denies SI, HI, AVH at this time. Patient's goal for today is to "be pain free".   A- Scheduled medications administered to patient, per MD orders. Support and encouragement provided.  Routine safety checks conducted every 15 minutes.  Patient informed to notify staff with problems or concerns.   R- No adverse drug reactions noted. Patient contracts for safety at this time. Patient compliant with medications and treatment plan. Patient receptive, calm, and cooperative. Patient interacts well with others on the unit.  Patient remains safe at this time.  Problem: Education: Goal: Knowledge of [place] Education information/materials will improve Outcome: Progressing   Problem: Safety: Goal: Periods of time without injury will increase Outcome: Progressing   Problem: Education: Goal: Utilization of techniques to improve thought processes will improve Outcome: Progressing Goal: Knowledge of the prescribed therapeutic regimen will improve Outcome: Progressing   Problem: Safety: Goal: Ability to disclose and discuss suicidal ideas will improve Outcome: Progressing Goal: Ability to identify and utilize support systems that promote safety will improve Outcome: Progressing   Problem: Self-Concept: Goal: Level of anxiety will decrease Outcome: Progressing

## 2018-09-04 MED ORDER — VITAMIN B-12 1000 MCG PO TABS: 5000.0000 ug | ORAL_TABLET | Freq: Every day | ORAL | 1 refills | Status: DC

## 2018-09-04 MED ORDER — ASPIRIN EC 81 MG PO TBEC: 81.0000 mg | DELAYED_RELEASE_TABLET | Freq: Every day | ORAL | 1 refills | Status: DC

## 2018-09-04 MED ORDER — MELOXICAM 15 MG PO TABS: 15.0000 mg | ORAL_TABLET | Freq: Every day | ORAL | 1 refills | Status: DC

## 2018-09-04 MED ORDER — DOCUSATE SODIUM 100 MG PO CAPS: 100.0000 mg | ORAL_CAPSULE | Freq: Two times a day (BID) | ORAL | 1 refills | Status: DC

## 2018-09-04 MED ORDER — FLUOXETINE HCL 20 MG PO CAPS: 20.0000 mg | ORAL_CAPSULE | Freq: Every day | ORAL | 1 refills | Status: DC

## 2018-09-04 MED ORDER — QUETIAPINE FUMARATE 100 MG PO TABS: 100.0000 mg | ORAL_TABLET | Freq: Every day | ORAL | 1 refills | Status: DC

## 2018-09-04 MED ORDER — AMLODIPINE BESYLATE 5 MG PO TABS: 5.0000 mg | ORAL_TABLET | Freq: Every day | ORAL | 1 refills | Status: DC

## 2018-09-04 MED ORDER — METOPROLOL TARTRATE 25 MG PO TABS: 25.0000 mg | ORAL_TABLET | Freq: Two times a day (BID) | ORAL | 1 refills | Status: DC

## 2018-09-04 MED ORDER — POTASSIUM CHLORIDE CRYS ER 20 MEQ PO TBCR: 20.0000 meq | EXTENDED_RELEASE_TABLET | Freq: Two times a day (BID) | ORAL | 1 refills | Status: DC

## 2018-09-04 NOTE — BHH Suicide Risk Assessment (Signed)
[facility] Discharge Suicide Risk Assessment   Principal Problem: MDD (major depressive disorder), recurrent, severe, with psychosis ([place]) Discharge Diagnoses: Principal Problem:   MDD (major depressive disorder), recurrent, severe, with psychosis ([place]) Active Problems:   Essential hypertension   Alcohol abuse   Total Time spent with patient: 45 minutes  Musculoskeletal: Strength & Muscle Tone: within normal limits Gait & Station: normal Patient leans: N/A  Psychiatric Specialty Exam: Review of Systems  Constitutional: Negative.   HENT: Negative.   Eyes: Negative.   Respiratory: Negative.   Cardiovascular: Negative.   Gastrointestinal: Negative.   Musculoskeletal: Negative.   Skin: Negative.   Neurological: Negative.   Psychiatric/Behavioral: Negative.     Blood pressure 118/76, pulse 66, temperature 97.9 F (36.6 C), temperature source Oral, resp. rate 18, height 6\' 2"  (1.88 m), weight 81.6 kg, SpO2 100 %.Body mass index is 23.1 kg/m.  General Appearance: Casual  Eye Contact::  Good  Speech:  Normal Rate409  Volume:  Normal  Mood:  Euthymic  Affect:  Congruent  Thought Process:  Coherent  Orientation:  Full (Time, Place, and Person)  Thought Content:  Logical  Suicidal Thoughts:  No  Homicidal Thoughts:  No  Memory:  Immediate;   Fair Recent;   Fair Remote;   Poor  Judgement:  Fair  Insight:  Fair  Psychomotor Activity:  Normal  Concentration:  Fair  Recall:  [employer] of Knowledge:Fair  Language: Fair  Akathisia:  No  Handed:  Right  AIMS (if indicated):     Assets:  Desire for Improvement  Sleep:  Number of Hours: 7  Cognition: WNL  ADL's:  Intact   Mental Status Per Nursing Assessment::   On Admission:  NA  Demographic Factors:  Male and Living alone  Loss Factors: Financial problems/change in socioeconomic status  Historical Factors: Impulsivity  Risk Reduction Factors:   Religious beliefs about death, Positive social support and Positive  therapeutic relationship  Continued Clinical Symptoms:  Alcohol/Substance Abuse/Dependencies  Cognitive Features That Contribute To Risk:  None    Suicide Risk:  Minimal: No identifiable suicidal ideation.  Patients presenting with no risk factors but with morbid ruminations; may be classified as minimal risk based on the severity of the depressive symptoms  [facility] Follow up on 09/05/2018.   Why: You have been accepted for admissions Thursday 09/05/18 at [facility] may bring 4 outfits, 2 sleep outfits, 2 pairs of closed shoes, prescriptions, and money for vending machine. Do NOT bring any toiletries. Thank You! Contact information: [address] [address] [phone]           Plan Of Care/Follow-up recommendations:  Activity:  Activity as tolerated Diet:  Regular diet Other:  Follow-up with substance abuse treatment inpatient and then with outpatient treatment to maintain stability of mood and sobriety  Alethia Berthold, MD 09/04/2018, 3:41 PM

## 2018-09-04 NOTE — Progress Notes (Signed)
Recreation Therapy Notes  Date: 09/04/2018  Time: 9:30 am  Location: Craft room  Behavioral response: Appropriate  Intervention Topic: Time Management  Discussion/Intervention:  Group content today was focused on time management. The group defined time management and identified healthy ways to manage time. Individuals expressed how much of the 24 hours they use in a day. Patients expressed how much time they use just for themselves personally. The group expressed how they have managed their time in the past. Individuals participated in the intervention "Managing Life" where they had a chance to see how much of the 24 hours they use and where it goes. Clinical Observations/Feedback:  Patient came to group and explained that he manages his time by breaking it up into sections to get things done. He explained that time is money. Participant expressed that he spends a lot of time working, cleaning, cooking and watching television.  Individual was social with peers and staff while participating in the intervention. Akim Watkinson LRT/CTRS         Malayia Spizzirri 09/04/2018 12:28 PM

## 2018-09-04 NOTE — Plan of Care (Signed)
Patient is alert and oriented X 4, denies SI, HI and AVH. Patient cooperative and compliant with medications. Patient is pleasant, unsure about discharge planning this morning. Patient mentioned " Someone else has my house keys so if I leave I need my keys to get into my house." No other concerns at this moment. Problem: Education: Goal: Knowledge of [facility] Education information/materials will improve Outcome: Progressing   Problem: Safety: Goal: Periods of time without injury will increase Outcome: Progressing   Problem: Education: Goal: Utilization of techniques to improve thought processes will improve Outcome: Progressing Goal: Knowledge of the prescribed therapeutic regimen will improve Outcome: Progressing   Problem: Safety: Goal: Ability to disclose and discuss suicidal ideas will improve Outcome: Progressing Goal: Ability to identify and utilize support systems that promote safety will improve Outcome: Progressing

## 2018-09-04 NOTE — Plan of Care (Signed)
  Problem: Education: Goal: Knowledge of the prescribed therapeutic regimen will improve Outcome: Progressing  Patient is very knowledgeable and has insight into his prescribed medication regimen.

## 2018-09-04 NOTE — BHH Group Notes (Signed)
Emotional Regulation 09/04/2018 1PM  Type of Therapy/Topic:  Group Therapy:  Emotion Regulation  Participation Level:  Active   Description of Group:   The purpose of this group is to assist patients in learning to regulate negative emotions and experience positive emotions. Patients will be guided to discuss ways in which they have been vulnerable to their negative emotions. These vulnerabilities will be juxtaposed with experiences of positive emotions or situations, and patients will be challenged to use positive emotions to combat negative ones. Special emphasis will be placed on coping with negative emotions in conflict situations, and patients will process healthy conflict resolution skills.  Therapeutic Goals: 1. Patient will identify two positive emotions or experiences to reflect on in order to balance out negative emotions 2. Patient will label two or more emotions that they find the most difficult to experience 3. Patient will demonstrate positive conflict resolution skills through discussion and/or role plays  Summary of Patient Progress:  Actively and appropriately engaged in the group. Patient was able to provide support and validation to other group members.Patient practiced active listening when interacting with the facilitator and other group members. Patient states he practices replacing negative thoughts with positive thoughts. Patient states "I try to stay prepared, so that way I can avoid surprises." Patient demonstrated good insight and respected boundaries during session.     Therapeutic Modalities:   Cognitive Behavioral Therapy Feelings Identification Dialectical Behavioral Therapy   Yvette Rack, LCSW 09/04/2018 1:43 PM

## 2018-09-04 NOTE — BHH Counselor (Signed)
CSW spoke with Tristan Cook at [employer]. Pt is scheduled to be picked up on Thursday 09/05/18 at 800a-830a and transport to [facility] in [place].

## 2018-09-04 NOTE — Progress Notes (Signed)
Patient alert and oriented x 4, affect is flat but he brightens upon approach no distress noted, his thoughts are organized and coherent, he denies SI/HI/AVH, denies pain at this time, noted interacting appropriately with peers and staff.  Patient was complaint with prescribed medication regimen, he attended evening wrap up group and was appropriate. 15 minutes safety checks maintained will continue to monitor.

## 2018-09-04 NOTE — Progress Notes (Signed)
[facility] MD Progress Note  09/04/2018 3:30 PM Tristan Cook  MRN:  [id number] Subjective: Patient seen.  Case discussed with treatment team.  Patient has a bed available at the alcohol [facility] drug abuse treatment center tomorrow.  He had previously planned to be discharged today but apparently there was some kind of breakdown in the plan.  Social work has been working with him and now the plan is direct transport from here to substance abuse treatment.  Mood is currently stable.  Affect upbeat.  No sign of dangerousness or psychosis. Principal Problem: MDD (major depressive disorder), recurrent, severe, with psychosis ([place]) Diagnosis: Principal Problem:   MDD (major depressive disorder), recurrent, severe, with psychosis ([place]) Active Problems:   Essential hypertension   Alcohol abuse  Total Time spent with patient: 30 minutes  Past Psychiatric History: Patient has a history of alcohol abuse.  Sounds like he has limited experience with sobriety  Past Medical History:  Past Medical History:  Diagnosis Date  . Abdominal pain   . Adenomatous colon polyp 2008  . Arthritis   . Back pain   . Bilateral lumbar radiculopathy   . Blurred vision, bilateral   . Chronic back pain   . Constipation   . Depression   . GERD (gastroesophageal reflux disease)   . Gout   . Hemorrhoids   . High cholesterol   . Hypertension   . Neck pain   . Neuropathy   . Neuropathy of both feet   . Numbness    rectal  . Numbness of legs   . Stroke ([place]) 2019  . Substance abuse ([place])    h/o excessive alcohol use; pt says he limits use to one to 2 beers daily he currently    Past Surgical History:  Procedure Laterality Date  . CATARACT EXTRACTION W/PHACO Right 08/16/2018   Procedure: CATARACT EXTRACTION PHACO AND INTRAOCULAR LENS PLACEMENT RIGHT EYE;  Surgeon: Baruch Goldmann, MD;  Location: [facility];  Service: Ophthalmology;  Laterality: Right;  CDE: 4.18  . COLONOSCOPY  10/09/2006   3 mm pedunculated sigmoid colon  polyp removed/8-mm sessile hepatic flexure polyp (tubular villous adenoma) removed/6-mm  descending colon polyp removed/small internal hemorrhoids  . COLONOSCOPY  02/28/2011   WUJ:WJXBJY, multiple in the rectum/Internal hemorrhoids, MODERATE-CAUSING RECTAL BLEEDING  . COLONOSCOPY N/A 07/05/2016   Procedure: COLONOSCOPY;  Surgeon: [name], MD;  Location: [facility];  Service: Endoscopy;  Laterality: N/A;  11:15am  . 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 Mild erosive gastrtitis most likely due to indomethacin   . FINGER SURGERY Right    4th and 5th digit  . FLEXIBLE SIGMOIDOSCOPY  01/02/2012   SLF: 1. the colonic mucosa appeared normal in the sigmoid colon 2. Large internal hemorrhoids: cause for rectal bleeding/pain . s/p banding X 3   . HAND SURGERY    . REVERSE SHOULDER ARTHROPLASTY Left 05/29/2018   Procedure: REVERSE SHOULDER ARTHROPLASTY;  Surgeon: Hiram Gash, MD;  Location: [facility];  Service: Orthopedics;  Laterality: Left;  . SAVORY DILATION N/A 07/21/2014   Procedure: SAVORY DILATION;  Surgeon: [name], MD;  Location: [facility];  Service: Endoscopy;  Laterality: N/A;   Family History:  Family History  Problem Relation Age of Onset  . Hypertension Mother   . Hypertension Father   . Stroke Father   . Hypertension Sister   . Hypertension Brother   . Cancer Brother   . Cancer Brother  throat cancer  . Hypertension Brother   . Diabetes Maternal Grandmother   . Colon cancer Neg Hx    Family Psychiatric  History: See previous Social History:  Social History   Substance and Sexual Activity  Alcohol Use Yes  . Alcohol/week: 3.0 standard drinks  . Types: 3 Cans of beer per week   Comment: drinks 6 pack of beer on weekends     Social History   Substance and Sexual Activity  Drug Use No    Social History   Socioeconomic History  . Marital status: Single    Spouse name: Not on file  .  Number of children: 0  . Years of education: 50  . Highest education level: Not on file  Occupational History  . Occupation: unemployed, Retail buyer: UNEMPLOYED  Social Needs  . Financial resource strain: Not on file  . Food insecurity    Worry: Not on file    Inability: Not on file  . Transportation needs    Medical: No    Non-medical: No  Tobacco Use  . Smoking status: Former Smoker    Packs/day: 1.50    Years: 33.00    Pack years: 49.50    Types: Cigarettes    Quit date: 08/16/2004    Years since quitting: 14.0  . Smokeless tobacco: Current User    Types: Snuff  . Tobacco comment: Dips every once in a while  Substance and Sexual Activity  . Alcohol use: Yes    Alcohol/week: 3.0 standard drinks    Types: 3 Cans of beer per week    Comment: drinks 6 pack of beer on weekends  . Drug use: No  . Sexual activity: Not on file  Lifestyle  . Physical activity    Days per week: Not on file    Minutes per session: Not on file  . Stress: Not on file  Relationships  . Social Herbalist on phone: Not on file    Gets together: Not on file    Attends religious service: Not on file    Active member of club or organization: Not on file    Attends meetings of clubs or organizations: Not on file    Relationship status: Not on file  Other Topics Concern  . Not on file  Social History Narrative   Lives alone   No caffeine   Right handed   Additional Social History:                         Sleep: Fair  Appetite:  Good  Current Medications: Current Facility-Administered Medications  Medication Dose Route Frequency Provider Last Rate Last Dose  . acetaminophen (TYLENOL) tablet 650 mg  650 mg Oral Q6H PRN Lavella Hammock, MD   650 mg at 09/01/18 1957  . alum & mag hydroxide-simeth (MAALOX/MYLANTA) 200-200-20 MG/5ML suspension 30 mL  30 mL Oral Q4H PRN Lavella Hammock, MD      . amLODipine (NORVASC) tablet 5 mg  5 mg Oral Daily Clapacs, Madie Reno, MD   5 mg at 09/04/18 0804  . aspirin EC tablet 81 mg  81 mg Oral Daily Lavella Hammock, MD   81 mg at 09/04/18 0804  . calcium-vitamin D (OSCAL WITH D) 500-200 MG-UNIT per tablet 1 tablet  1 tablet Oral Q breakfast Lavella Hammock, MD   1 tablet at 09/04/18 0805  . cholecalciferol (VITAMIN D3)  tablet 400 Units  400 Units Oral Daily Lavella Hammock, MD   400 Units at 09/04/18 0804  . docusate sodium (COLACE) capsule 100 mg  100 mg Oral BID Clapacs, Madie Reno, MD   100 mg at 09/04/18 0805  . FLUoxetine (PROZAC) capsule 20 mg  20 mg Oral Daily Lavella Hammock, MD   20 mg at 09/04/18 0804  . folic acid (FOLVITE) tablet 0.5 mg  500 mcg Oral Daily Lavella Hammock, MD   0.5 mg at 09/04/18 0804  . ibuprofen (ADVIL) tablet 800 mg  800 mg Oral Q8H PRN Clapacs, Madie Reno, MD   800 mg at 09/03/18 1717  . magnesium hydroxide (MILK OF MAGNESIA) suspension 30 mL  30 mL Oral Daily PRN Lavella Hammock, MD   30 mL at 09/04/18 [phone]  . meloxicam (MOBIC) tablet 15 mg  15 mg Oral Daily Lavella Hammock, MD   15 mg at 09/04/18 0804  . metoprolol tartrate (LOPRESSOR) tablet 25 mg  25 mg Oral BID Lavella Hammock, MD   25 mg at 09/04/18 0804  . multivitamin with minerals tablet 1 tablet  1 tablet Oral Daily Lavella Hammock, MD   1 tablet at 09/04/18 0804  . polyvinyl alcohol (LIQUIFILM TEARS) 1.4 % ophthalmic solution 2 drop  2 drop Both Eyes QID PRN Clapacs, Madie Reno, MD   2 drop at 09/04/18 0805  . potassium chloride SA (K-DUR) CR tablet 20 mEq  20 mEq Oral BID Clapacs, Madie Reno, MD   20 mEq at 09/04/18 0804  . prednisoLONE acetate (PRED FORTE) 1 % ophthalmic suspension 1 drop  1 drop Both Eyes QID Clapacs, Madie Reno, MD   1 drop at 09/04/18 1348  . QUEtiapine (SEROQUEL) tablet 100 mg  100 mg Oral QHS Clapacs, Madie Reno, MD   100 mg at 09/03/18 2138  . thiamine (VITAMIN B-1) tablet 100 mg  100 mg Oral Daily Lavella Hammock, MD   100 mg at 09/04/18 3267   Or  . thiamine (B-1) injection 100 mg  100 mg Intravenous Daily Lavella Hammock, MD      . trolamine salicylate (ASPERCREME) 10 % cream   Topical BID PRN Clapacs, Madie Reno, MD      . vitamin B-12 (CYANOCOBALAMIN) tablet 5,000 mcg  5,000 mcg Oral Daily Lavella Hammock, MD   5,000 mcg at 09/04/18 1245    Lab Results: No results found for this or any previous visit (from the past 48 hour(s)).  Blood Alcohol level:  Lab Results  Component Value Date   ETH 208 (H) 08/27/2018   ETH <10 80/99/8338    Metabolic Disorder Labs: Lab Results  Component Value Date   HGBA1C 5.4 08/27/2018   MPG 108.28 08/27/2018   MPG 100 10/02/2016   No results found for: PROLACTIN Lab Results  Component Value Date   CHOL 438 (H) 08/27/2018   TRIG 855 (H) 08/27/2018   HDL 47 08/27/2018   CHOLHDL 9.3 08/27/2018   VLDL UNABLE TO CALCULATE IF TRIGLYCERIDE OVER 400 mg/dL 08/27/2018   LDLCALC UNABLE TO CALCULATE IF TRIGLYCERIDE OVER 400 mg/dL 08/27/2018   LDLCALC 87 03/03/2016    Physical Findings: AIMS:  , ,  ,  ,    CIWA:  CIWA-Ar Total: 0 COWS:     Musculoskeletal: Strength & Muscle Tone: within normal limits Gait & Station: normal Patient leans: N/A  Psychiatric Specialty Exam: Physical Exam  Nursing note and vitals reviewed. Constitutional: He  appears well-developed and well-nourished.  HENT:  Head: Normocephalic and atraumatic.  Eyes: Pupils are equal, round, and reactive to light. Conjunctivae are normal.  Neck: Normal range of motion.  Cardiovascular: Regular rhythm and normal heart sounds.  Respiratory: Effort normal.  GI: Soft.  Musculoskeletal: Normal range of motion.  Neurological: He is alert.  Skin: Skin is warm and dry.  Psychiatric: He has a normal mood and affect. His speech is normal and behavior is normal. Judgment and thought content normal. Cognition and memory are normal.    Review of Systems  Constitutional: Negative.   HENT: Negative.   Eyes: Negative.   Respiratory: Negative.   Cardiovascular: Negative.   Gastrointestinal: Negative.    Musculoskeletal: Negative.   Skin: Negative.   Neurological: Negative.   Psychiatric/Behavioral: Negative.     Blood pressure 118/76, pulse 66, temperature 97.9 F (36.6 C), temperature source Oral, resp. rate 18, height 6\' 2"  (1.88 m), weight 81.6 kg, SpO2 100 %.Body mass index is 23.1 kg/m.  General Appearance: Casual  Eye Contact:  Good  Speech:  Clear and Coherent  Volume:  Normal  Mood:  Euthymic  Affect:  Congruent  Thought Process:  Goal Directed  Orientation:  Full (Time, Place, and Person)  Thought Content:  Logical  Suicidal Thoughts:  No  Homicidal Thoughts:  No  Memory:  Immediate;   Fair Recent;   Fair Remote;   Fair  Judgement:  Fair  Insight:  Fair  Psychomotor Activity:  EPS  Concentration:  Concentration: Fair  Recall:  [employer] of Knowledge:  Fair  Language:  Fair  Akathisia:  No  Handed:  Right  AIMS (if indicated):     Assets:  Desire for Improvement Physical Health  ADL's:  Intact  Cognition:  WNL  Sleep:  Number of Hours: 7     Treatment Plan Summary: Daily contact with patient to assess and evaluate symptoms and progress in treatment, Medication management and Plan Tolerating medication.  No sign of dangerousness.  We have waited all this time to get him into the alcohol and drug abuse treatment center.  Arrangements are in place for direct transfer tomorrow morning.  Prescriptions will be arranged and he can be discharged as soon as transportation comes tomorrow.  Alethia Berthold, MD 09/04/2018, 3:30 PM

## 2018-09-05 NOTE — Progress Notes (Signed)
D - Patient was in the dayroom upon arrival to the unit. Patient was pleasant during assessment and medication administration. Patient denies SI/HI/AVH, anxiety and depression. Patient stated he had some pain on his left side under his arm. Patient rated the pain 7/10. See MAR. Patient is ready to DC tomorrow.   A - Patient compliant with medication administration per MD orders and procedures on the unit. Patient given education. Patient given support and encouragement to be active in his treatment plan. Patient informed to let staff know if there are any issues or problems on the unit.   R - Patient being monitored Q 15 minutes for safety per unit protocol. Patient remains safe on the unit.

## 2018-09-05 NOTE — Plan of Care (Signed)
Pt denies SI, SI, AVH, depression and anxiety. Pt has a goal to d/c. Collier Bullock RN Problem: Education: Goal: Knowledge of [facility] Education information/materials will improve Outcome: Adequate for Discharge   Problem: Safety: Goal: Periods of time without injury will increase Outcome: Adequate for Discharge   Problem: Education: Goal: Utilization of techniques to improve thought processes will improve Outcome: Adequate for Discharge Goal: Knowledge of the prescribed therapeutic regimen will improve Outcome: Adequate for Discharge   Problem: Safety: Goal: Ability to disclose and discuss suicidal ideas will improve Outcome: Adequate for Discharge Goal: Ability to identify and utilize support systems that promote safety will improve Outcome: Adequate for Discharge   Problem: Self-Concept: Goal: Level of anxiety will decrease Outcome: Adequate for Discharge

## 2018-09-05 NOTE — Progress Notes (Signed)
  [facility] Adult Case Management Discharge Plan :  Will you be returning to the same living situation after discharge:  No. Pt will go to [facility] At discharge, do you have transportation home?: Yes,  [facility] will take pt to [place] Do you have the ability to pay for your medications: Yes,  [facility] Medicare  Release of information consent forms completed and in the chart;    Patient to Follow up at: [place] Follow up on 09/05/2018.   Why: You have been accepted for admissions Thursday 09/05/18 at [place] may bring 4 outfits, 2 sleep outfits, 2 pairs of closed shoes, prescriptions, and money for vending machine. Do NOT bring any toiletries. Thank You! Contact information: [place] [address] [phone]           Next level of care provider has access to [place] and Suicide Prevention discussed: Yes,  SPE completed with pts girlfriend  Have you used any form of tobacco in the last 30 days? (Cigarettes, Smokeless Tobacco, Cigars, and/or Pipes): Yes  Has patient been referred to the Quitline?: Patient refused referral  Patient has been referred for addiction treatment: Yes  Mariann Laster Rykker Coviello, LCSW 09/05/2018, 8:08 AM

## 2018-09-05 NOTE — Progress Notes (Signed)
Pt was educated on d/c instructions. Pt received AVS, transition record, suicide risk assessment prescriptions, medications and belongings. Pt denies SI and HI. Collier Bullock RN

## 2018-09-05 NOTE — Progress Notes (Signed)
Recreation Therapy Notes  INPATIENT RECREATION TR PLAN  Patient Details Name: Tristan Cook MRN: [id number] DOB: 1960/06/24 Today's Date: 09/05/2018  Rec Therapy Plan Is patient appropriate for Therapeutic Recreation?: Yes Treatment times per week: at least 3 Estimated Length of Stay: 5-7 days TR Treatment/Interventions: Group participation (Comment)  Discharge Criteria Pt will be discharged from therapy if:: Discharged Treatment plan/goals/alternatives discussed and agreed upon by:: Patient/family  Discharge Summary Short term goals set: Patient will engage in groups without prompting or encouragement from LRT x3 group sessions within 5 recreation therapy group sessions Short term goals met: Complete Progress toward goals comments: Groups attended Which groups?: Communication, Goal setting, Other (Comment)(Problem Solving, Time management) Reason goals not met: N/A Therapeutic equipment acquired: N/A Reason patient discharged from therapy: Discharge from hospital Pt/family agrees with progress & goals achieved: Yes Date patient discharged from therapy: 09/05/18   Tristan Cook 09/05/2018, 11:29 AM

## 2018-09-05 NOTE — Plan of Care (Signed)
Patient said this is the best he has felt in a while and is ready to D/C tomorrow.   Problem: Self-Concept: Goal: Level of anxiety will decrease Outcome: Progressing

## 2018-09-06 ENCOUNTER — Ambulatory Visit: Admit: 2018-09-06 | Payer: Medicaid Other | Admitting: Ophthalmology

## 2018-09-06 SURGERY — PHACOEMULSIFICATION, CATARACT, WITH IOL INSERTION
Anesthesia: Monitor Anesthesia Care | Laterality: Left

## 2018-09-06 NOTE — Discharge Summary (Signed)
Physician Discharge Summary Note  Patient:  Tristan Cook is an 58 y.o., male MRN:  [id number] DOB:  12/13/1960 Patient phone:  [phone] (home)  Patient address:   [address] [place] [address],  Total Time spent with patient: 45 minutes  Date of Admission:  08/27/2018 Date of Discharge: September 05, 2018  Reason for Admission: Admitted with depression possible suicidal ideation substance abuse  Principal Problem: MDD (major depressive disorder), recurrent, severe, with psychosis ([place]) Discharge Diagnoses: Principal Problem:   MDD (major depressive disorder), recurrent, severe, with psychosis ([place]) Active Problems:   Essential hypertension   Alcohol abuse   Past Psychiatric History: History of substance abuse  Past Medical History:  Past Medical History:  Diagnosis Date  . Abdominal pain   . Adenomatous colon polyp 2008  . Arthritis   . Back pain   . Bilateral lumbar radiculopathy   . Blurred vision, bilateral   . Chronic back pain   . Constipation   . Depression   . GERD (gastroesophageal reflux disease)   . Gout   . Hemorrhoids   . High cholesterol   . Hypertension   . Neck pain   . Neuropathy   . Neuropathy of both feet   . Numbness    rectal  . Numbness of legs   . Stroke ([place]) 2019  . Substance abuse ([place])    h/o excessive alcohol use; pt says he limits use to one to 2 beers daily he currently    Past Surgical History:  Procedure Laterality Date  . CATARACT EXTRACTION W/PHACO Right 08/16/2018   Procedure: CATARACT EXTRACTION PHACO AND INTRAOCULAR LENS PLACEMENT RIGHT EYE;  Surgeon: Baruch Goldmann, MD;  Location: [facility];  Service: Ophthalmology;  Laterality: Right;  CDE: 4.18  . COLONOSCOPY  10/09/2006   3 mm pedunculated sigmoid colon polyp removed/8-mm sessile hepatic flexure polyp (tubular villous adenoma) removed/6-mm  descending colon polyp removed/small internal hemorrhoids  . COLONOSCOPY  02/28/2011   ERD:EYCXKG, multiple in the  rectum/Internal hemorrhoids, MODERATE-CAUSING RECTAL BLEEDING  . COLONOSCOPY N/A 07/05/2016   Procedure: COLONOSCOPY;  Surgeon: [name], MD;  Location: [facility];  Service: Endoscopy;  Laterality: N/A;  11:15am  . 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 Mild erosive gastrtitis most likely due to indomethacin   . FINGER SURGERY Right    4th and 5th digit  . FLEXIBLE SIGMOIDOSCOPY  01/02/2012   SLF: 1. the colonic mucosa appeared normal in the sigmoid colon 2. Large internal hemorrhoids: cause for rectal bleeding/pain . s/p banding X 3   . HAND SURGERY    . REVERSE SHOULDER ARTHROPLASTY Left 05/29/2018   Procedure: REVERSE SHOULDER ARTHROPLASTY;  Surgeon: Hiram Gash, MD;  Location: [facility];  Service: Orthopedics;  Laterality: Left;  . SAVORY DILATION N/A 07/21/2014   Procedure: SAVORY DILATION;  Surgeon: [name], MD;  Location: [facility];  Service: Endoscopy;  Laterality: N/A;   Family History:  Family History  Problem Relation Age of Onset  . Hypertension Mother   . Hypertension Father   . Stroke Father   . Hypertension Sister   . Hypertension Brother   . Cancer Brother   . Cancer Brother        throat cancer  . Hypertension Brother   . Diabetes Maternal Grandmother   . Colon cancer Neg Hx    Family Psychiatric  History: None Social History:  Social History   Substance  and Sexual Activity  Alcohol Use Yes  . Alcohol/week: 3.0 standard drinks  . Types: 3 Cans of beer per week   Comment: drinks 6 pack of beer on weekends     Social History   Substance and Sexual Activity  Drug Use No    Social History   Socioeconomic History  . Marital status: Single    Spouse name: Not on file  . Number of children: 0  . Years of education: 78  . Highest education level: Not on file  Occupational History  . Occupation: unemployed, Retail buyer: UNEMPLOYED  Social Needs  . Financial  resource strain: Not on file  . Food insecurity    Worry: Not on file    Inability: Not on file  . Transportation needs    Medical: No    Non-medical: No  Tobacco Use  . Smoking status: Former Smoker    Packs/day: 1.50    Years: 33.00    Pack years: 49.50    Types: Cigarettes    Quit date: 08/16/2004    Years since quitting: 14.0  . Smokeless tobacco: Current User    Types: Snuff  . Tobacco comment: Dips every once in a while  Substance and Sexual Activity  . Alcohol use: Yes    Alcohol/week: 3.0 standard drinks    Types: 3 Cans of beer per week    Comment: drinks 6 pack of beer on weekends  . Drug use: No  . Sexual activity: Not on file  Lifestyle  . Physical activity    Days per week: Not on file    Minutes per session: Not on file  . Stress: Not on file  Relationships  . Social Herbalist on phone: Not on file    Gets together: Not on file    Attends religious service: Not on file    Active member of club or organization: Not on file    Attends meetings of clubs or organizations: Not on file    Relationship status: Not on file  Other Topics Concern  . Not on file  Social History Narrative   Lives alone   No caffeine   Right handed    Hospital Course: Detoxed without incident.  Engaged in appropriate treatment.  Tolerated medicine well.  Requested that we refer him to inpatient substance abuse treatment.  Bed offer eventually came from [place].  Patient was discharged with [facility] with transfer directly to the alcohol and drug abuse treatment center.  At the time of discharge no sign of any psychosis or dangerousness  Physical Findings: AIMS:  , ,  ,  ,    CIWA:  CIWA-Ar Total: 0 COWS:     Musculoskeletal: Strength & Muscle Tone: within normal limits Gait & Station: normal Patient leans: N/A  Psychiatric Specialty Exam: Physical Exam  Nursing note and vitals reviewed. Constitutional: He appears well-developed and well-nourished.  HENT:  Head:  Normocephalic and atraumatic.  Eyes: Pupils are equal, round, and reactive to light. Conjunctivae are normal.  Neck: Normal range of motion.  Cardiovascular: Normal heart sounds.  Respiratory: Effort normal.  GI: Soft.  Musculoskeletal: Normal range of motion.  Neurological: He is alert.  Skin: Skin is warm and dry.  Psychiatric: He has a normal mood and affect. His behavior is normal. Judgment and thought content normal.    Review of Systems  Constitutional: Negative.   HENT: Negative.   Eyes: Negative.   Respiratory: Negative.  Cardiovascular: Negative.   Gastrointestinal: Negative.   Musculoskeletal: Negative.   Skin: Negative.   Neurological: Negative.   Psychiatric/Behavioral: Negative.     Blood pressure 128/72, pulse 78, temperature 97.9 F (36.6 C), temperature source Oral, resp. rate 17, height 6\' 2"  (1.88 m), weight 81.6 kg, SpO2 100 %.Body mass index is 23.1 kg/m.  General Appearance: Casual  Eye Contact:  Good  Speech:  Clear and Coherent  Volume:  Normal  Mood:  Euthymic  Affect:  Congruent  Thought Process:  Goal Directed  Orientation:  Full (Time, Place, and Person)  Thought Content:  Logical  Suicidal Thoughts:  No  Homicidal Thoughts:  No  Memory:  Immediate;   Fair Recent;   Fair Remote;   Fair  Judgement:  Fair  Insight:  Fair  Psychomotor Activity:  Normal  Concentration:  Concentration: Fair  Recall:  [place] of Knowledge:  Fair  Language:  Fair  Akathisia:  No  Handed:  Right  AIMS (if indicated):     Assets:  Desire for Improvement  ADL's:  Intact  Cognition:  WNL  Sleep:  Number of Hours: 6.5     Have you used any form of tobacco in the last 30 days? (Cigarettes, Smokeless Tobacco, Cigars, and/or Pipes): Yes  Has this patient used any form of tobacco in the last 30 days? (Cigarettes, Smokeless Tobacco, Cigars, and/or Pipes) Yes, Yes, A prescription for an FDA-approved tobacco cessation medication was offered at discharge and the  patient refused  Blood Alcohol level:  Lab Results  Component Value Date   ETH 208 (H) 08/27/2018   ETH <10 29/56/2130    Metabolic Disorder Labs:  Lab Results  Component Value Date   HGBA1C 5.4 08/27/2018   MPG 108.28 08/27/2018   MPG 100 10/02/2016   No results found for: PROLACTIN Lab Results  Component Value Date   CHOL 438 (H) 08/27/2018   TRIG 855 (H) 08/27/2018   HDL 47 08/27/2018   CHOLHDL 9.3 08/27/2018   VLDL UNABLE TO CALCULATE IF TRIGLYCERIDE OVER 400 mg/dL 08/27/2018   LDLCALC UNABLE TO CALCULATE IF TRIGLYCERIDE OVER 400 mg/dL 08/27/2018   LDLCALC 87 03/03/2016    See Psychiatric Specialty Exam and Suicide Risk Assessment completed by Attending Physician prior to discharge.  Discharge destination:  [facility]  Is patient on multiple antipsychotic therapies at discharge:  No   Has Patient had three or more failed trials of antipsychotic monotherapy by history:  No  Recommended Plan for Multiple Antipsychotic Therapies: NA  Discharge Instructions    Diet - low sodium heart healthy   Complete by: As directed    Increase activity slowly   Complete by: As directed      Allergies as of 09/05/2018      Reactions   Hydrocodone    hallucinations from hydrocodone   Benadryl [diphenhydramine Hcl (sleep)] Other (See Comments)   Palpitations      Medication List    STOP taking these medications   Calcium 500/D 500-200 MG-UNIT tablet Generic drug: calcium-vitamin D   folic acid 865 MCG tablet Commonly known as: FOLVITE   moxifloxacin 0.5 % ophthalmic solution Commonly known as: VIGAMOX   multivitamin with minerals Tabs tablet   prednisoLONE acetate 1 % ophthalmic suspension Commonly known as: PRED FORTE   Vitamin D-400 10 MCG (400 UNIT) Tabs tablet Generic drug: cholecalciferol     TAKE these medications     Indication  amLODipine 5 MG tablet Commonly known as: NORVASC Take  1 tablet (5 mg total) by mouth daily.  Indication: High Blood Pressure  Disorder   aspirin EC 81 MG tablet Take 1 tablet (81 mg total) by mouth daily.  Indication: Stable Angina Pectoris   docusate sodium 100 MG capsule Commonly known as: COLACE Take 1 capsule (100 mg total) by mouth 2 (two) times daily.  Indication: Constipation   FLUoxetine 20 MG capsule Commonly known as: PROZAC Take 1 capsule (20 mg total) by mouth daily.  Indication: Depression   meloxicam 15 MG tablet Commonly known as: MOBIC Take 1 tablet (15 mg total) by mouth daily.  Indication: Joint Damage causing Pain and Loss of Function   metoprolol tartrate 25 MG tablet Commonly known as: LOPRESSOR Take 1 tablet (25 mg total) by mouth 2 (two) times daily. What changed: medication strength  Indication: High Blood Pressure Disorder   potassium chloride SA 20 MEQ tablet Commonly known as: K-DUR Take 1 tablet (20 mEq total) by mouth 2 (two) times daily.  Indication: Low Amount of Potassium in the Blood   QUEtiapine 100 MG tablet Commonly known as: SEROQUEL Take 1 tablet (100 mg total) by mouth at bedtime.  Indication: Depressive Phase of Manic-Depression   vitamin B-12 1000 MCG tablet Commonly known as: CYANOCOBALAMIN Take 5 tablets (5,000 mcg total) by mouth daily.  Indication: Inadequate Vitamin B12      Follow-up [facility] Follow up on 09/05/2018.   Why: You have been accepted for admissions Thursday 09/05/18 at [facility] may bring 4 outfits, 2 sleep outfits, 2 pairs of closed shoes, prescriptions, and money for vending machine. Do NOT bring any toiletries. Thank You! Contact information: [address] [place] [address] [phone]           Follow-up recommendations:  Activity:  Activity as tolerated Diet:  Regular diet Other:  [facility]up at [facility]  Comments: Supportive counseling and review of medication and prescriptions given at discharge  Signed: Alethia Berthold, MD 09/06/2018, 6:02 PM

## 2018-11-25 IMAGING — DX DG FOOT COMPLETE 3+V*R*
3 series · 3 of 3 positions shown · non-contrast
Comparison: Right foot series 11/04/2013.

CLINICAL DATA: 56-year-old male status post fall 2 days ago. Pain
and swelling.

EXAM:
RIGHT FOOT COMPLETE - 3+ VIEW

[foot ap]
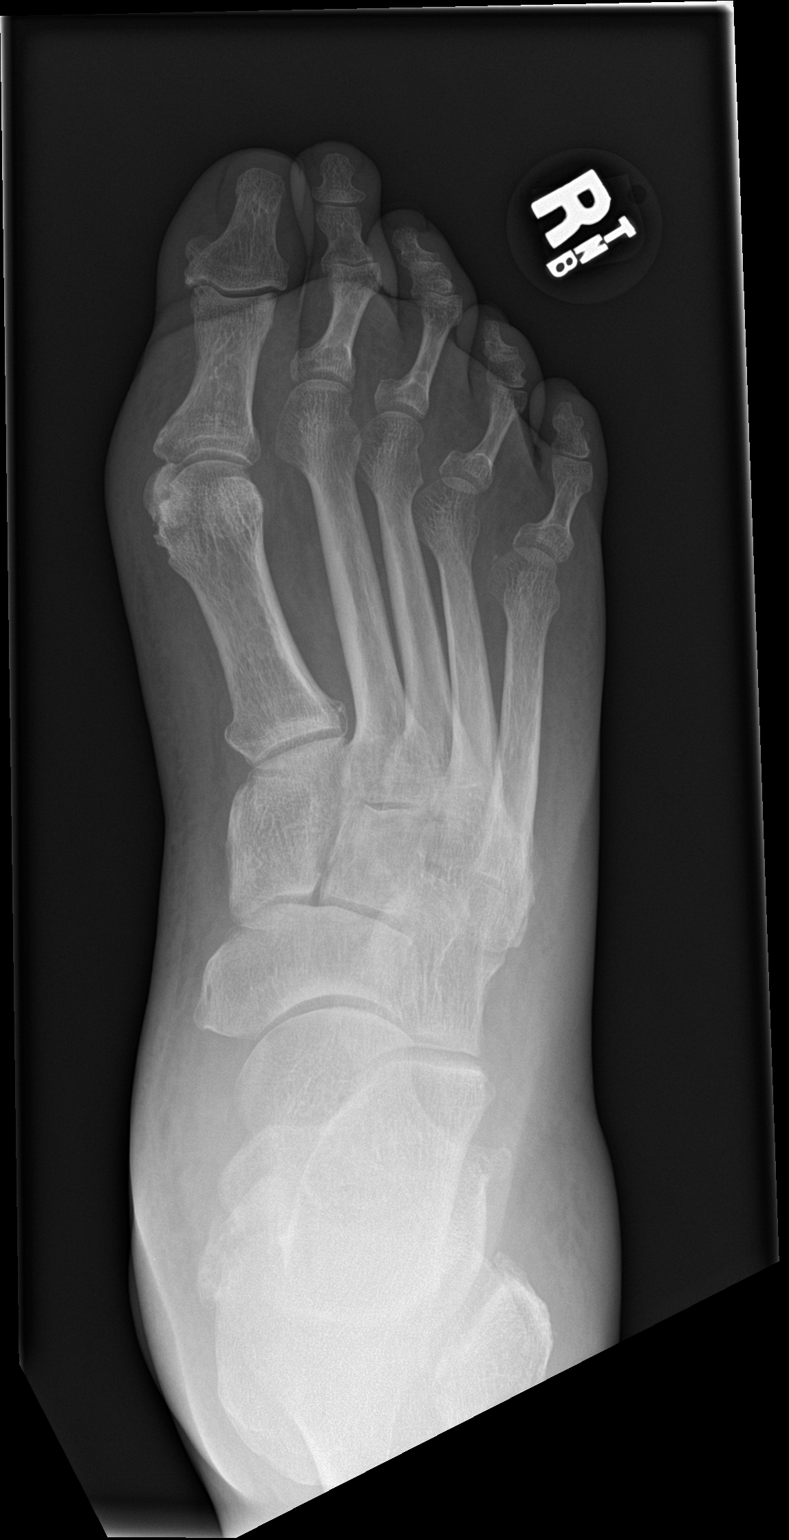

[foot obl]
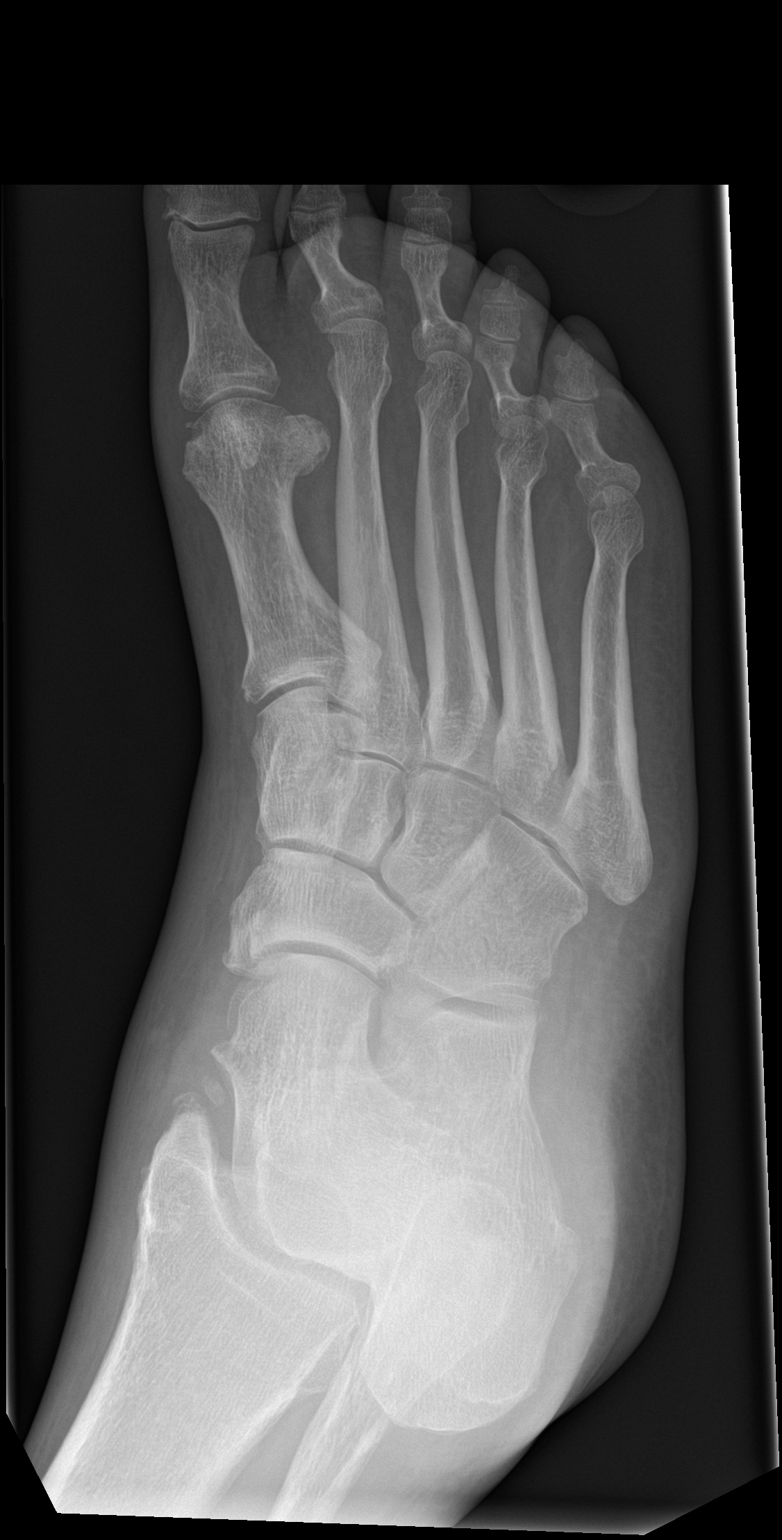

[foot lat]
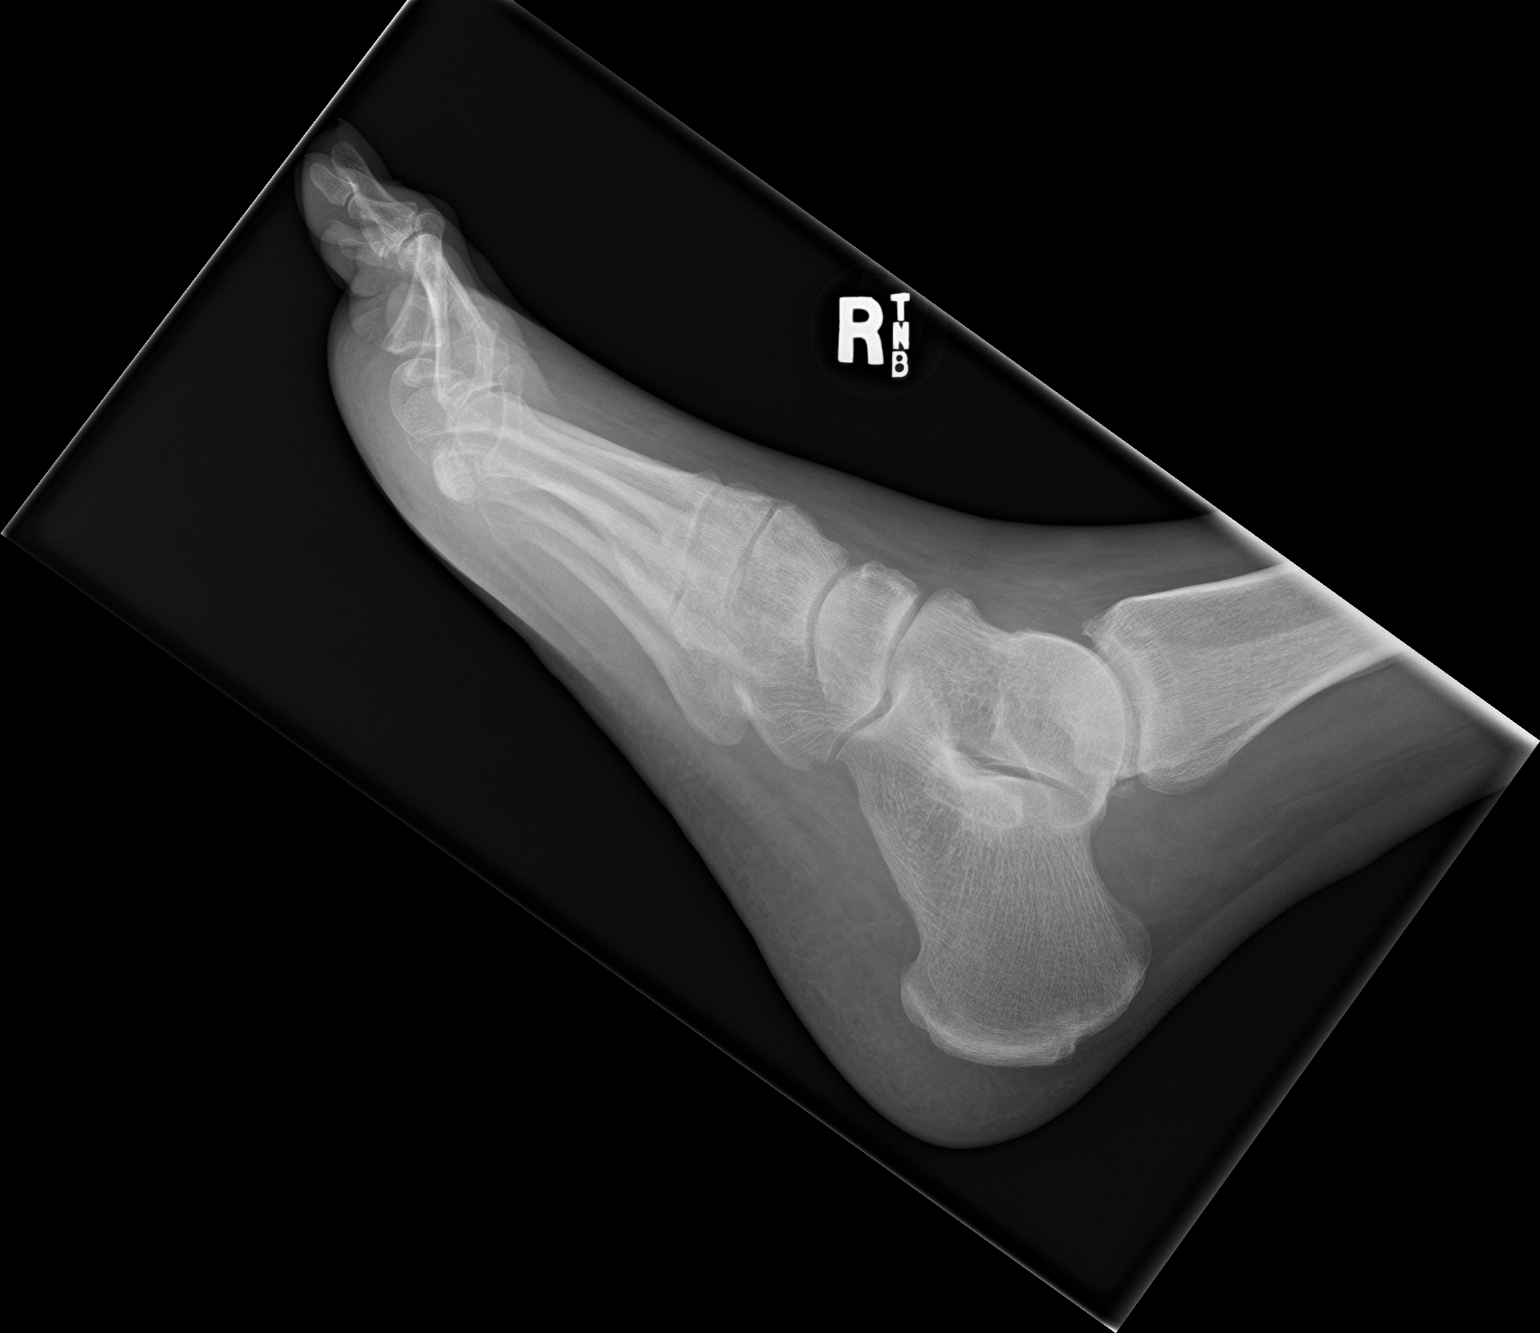

[3 of 3 positions shown; findings below may reference images not displayed]

FINDINGS: Bone mineralization remains normal. Stable tarsal bones including
chronic degenerative spurring at the anterior talus. Superimposed
chronic probable posttraumatic ossific fragment distal to the distal
tibia medial malleolus. Tarsal bone alignment and joint spaces
within normal limits. Metatarsals appear stable and intact. Mild 1st
MTP joint degeneration. No phalanx fracture identified.
IMPRESSION: No acute fracture or dislocation identified about the right foot.

## 2018-11-25 IMAGING — CT CT ANKLE*R* W/O CM
3 of 5 series · 12 of 34 positions shown, 13 images · non-contrast
Comparison: None.

CLINICAL DATA: Right ankle pain secondary to fall.

EXAM:
CT OF THE RIGHT ANKLE WITHOUT CONTRAST
TECHNIQUE: Multidetector CT imaging of the right ankle was performed according
to the standard protocol. Multiplanar CT image reconstructions were
also generated.

[Series 4: axial bone · axial · 0.32mm/px · z∈[-138,-51]mm · 4 of 133 slices shown, 5 images]
[im 23/133  soft-tissue]
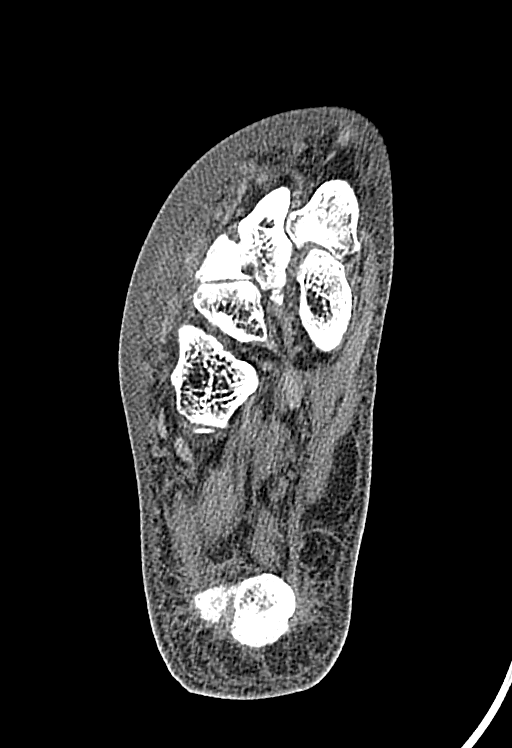
[im 23/133  bone]
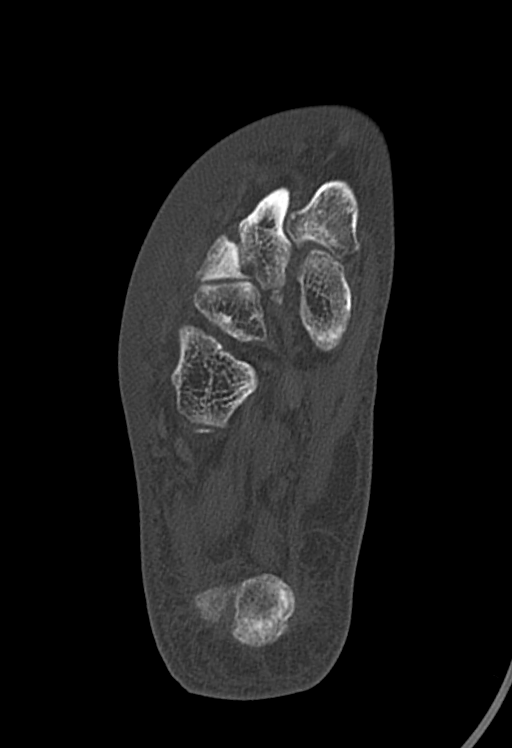
[im 45/133  bone]
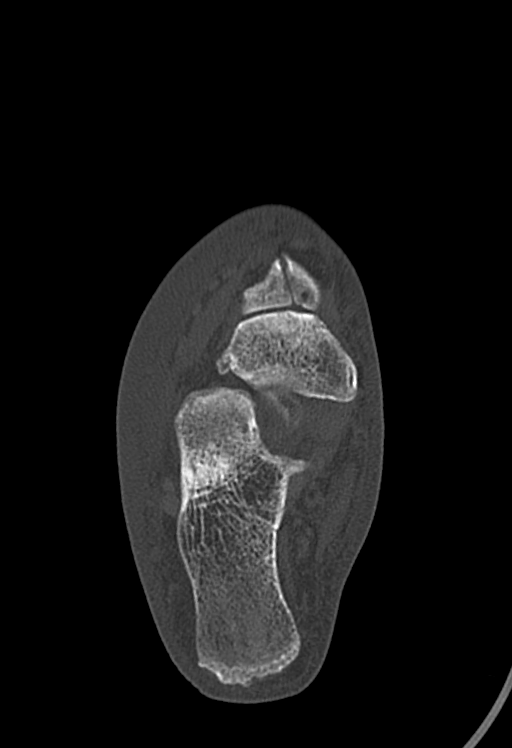
[im 89/133  bone]
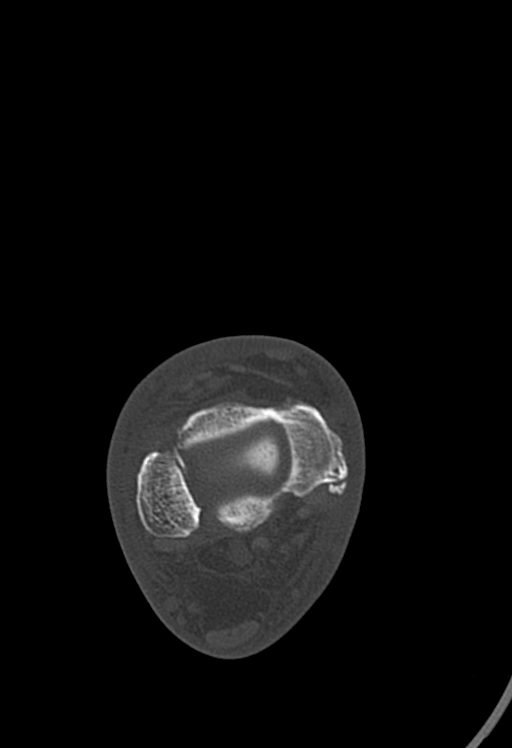
[im 111/133  bone]
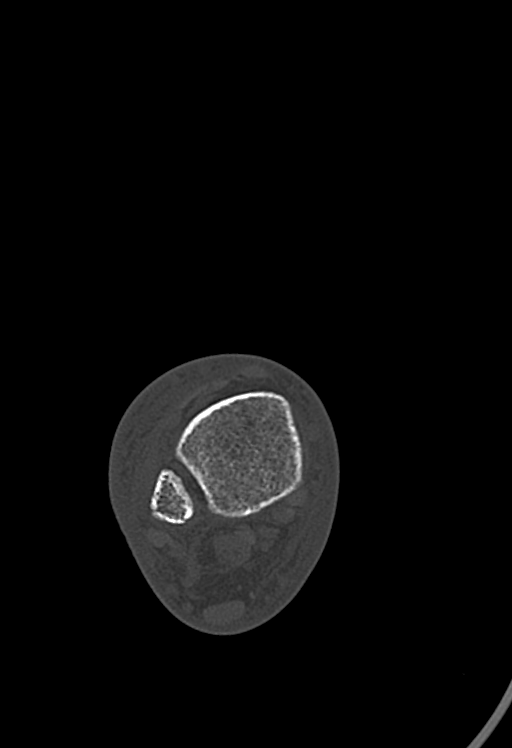

[Series 6: sag bone · sagittal · 0.33mm/px · 5 of 121 slices shown]
[im 21/121  bone]
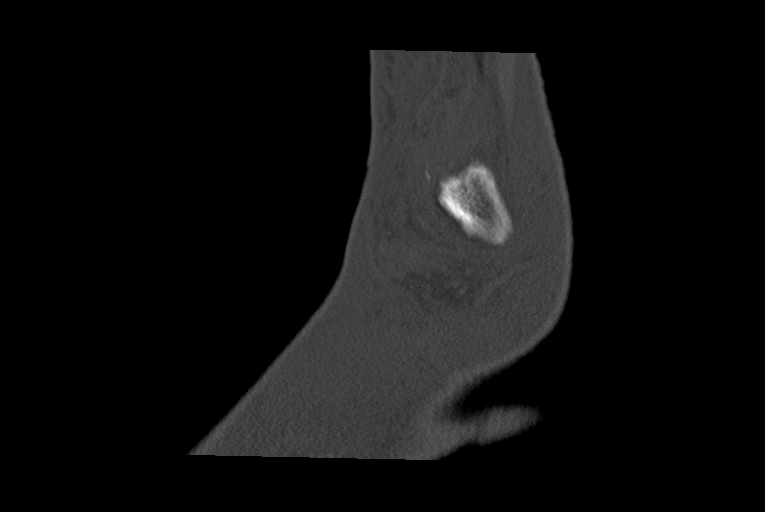
[im 41/121  bone]
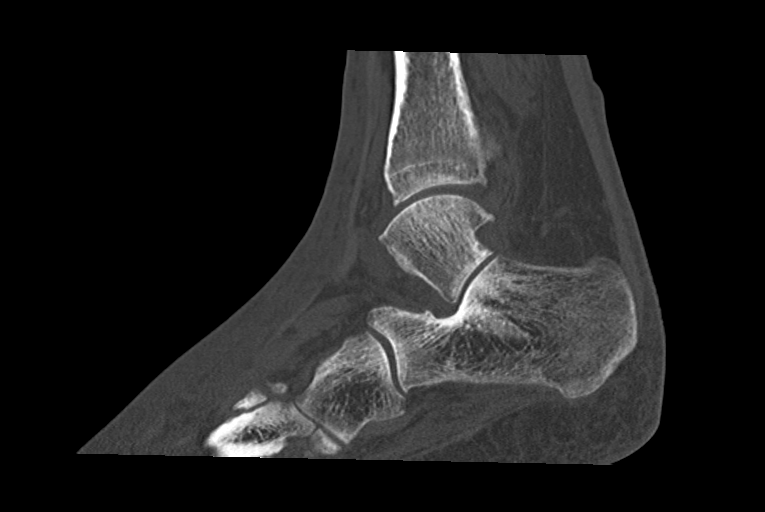
[im 61/121  bone]
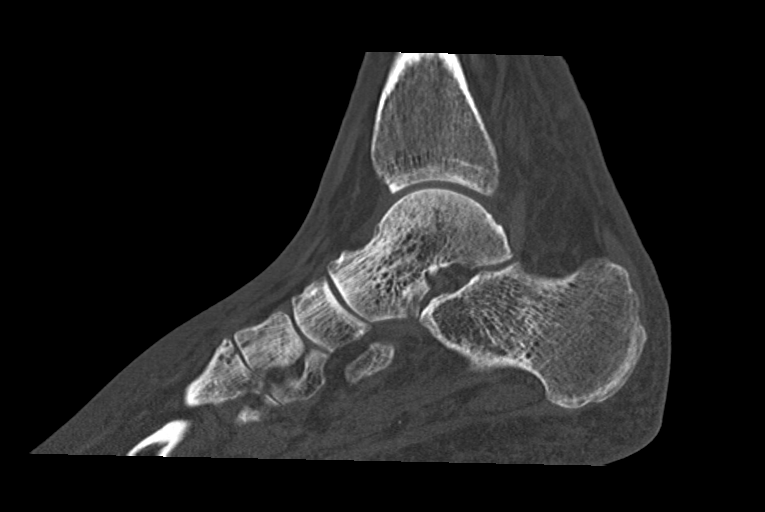
[im 81/121  bone]
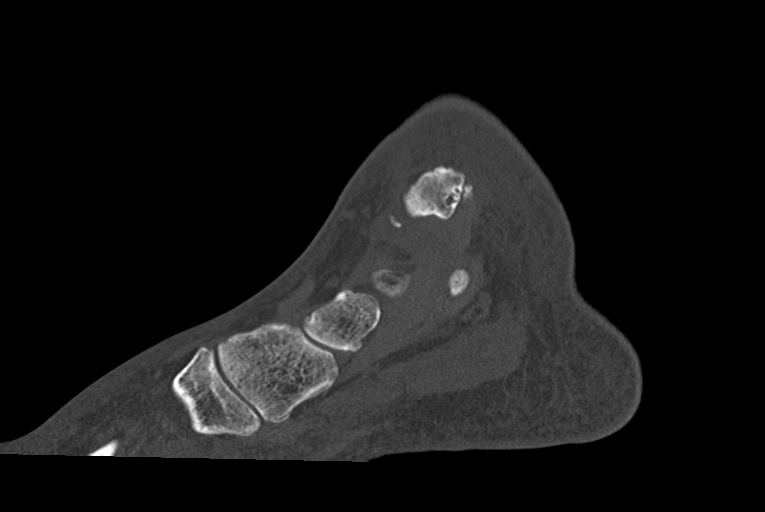
[im 101/121  bone]
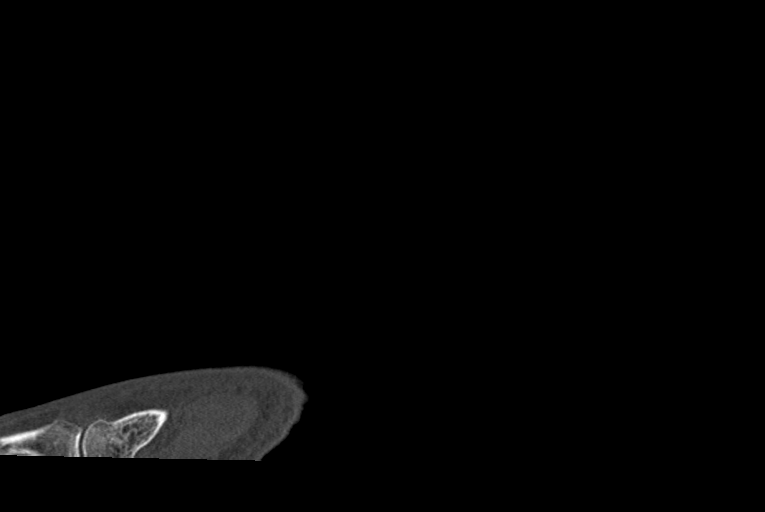

[Series 8: cor st · coronal · 0.25mm/px · 3 of 251 slices shown]
[im 51/251  bone]
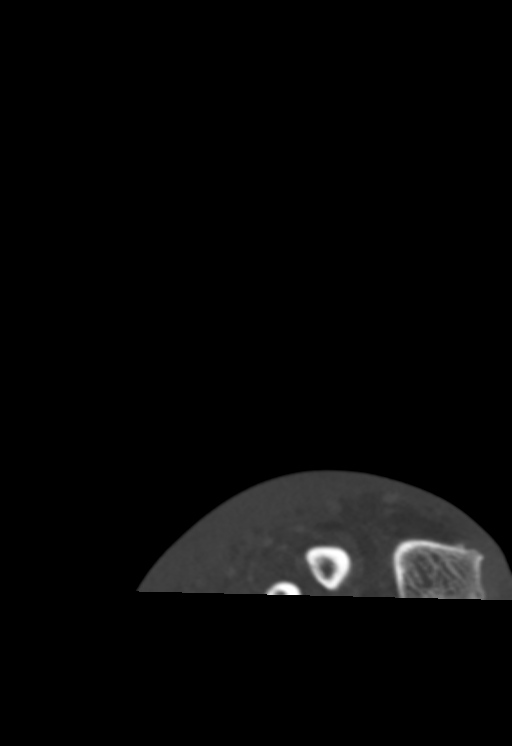
[im 101/251  bone]
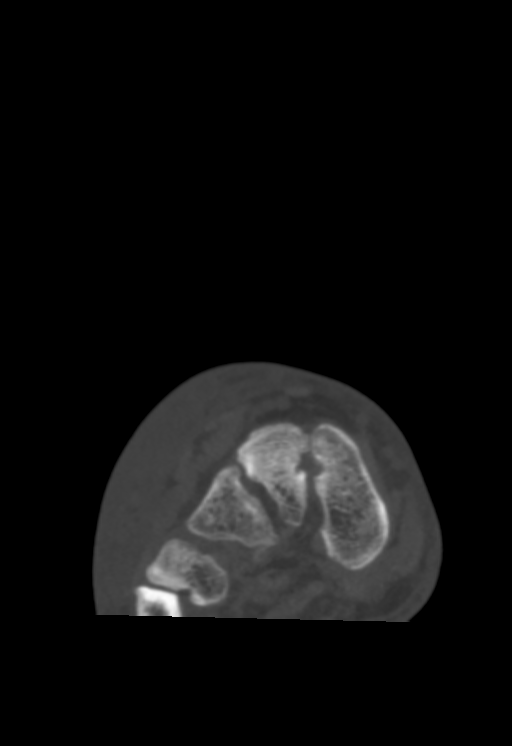
[im 151/251  bone]
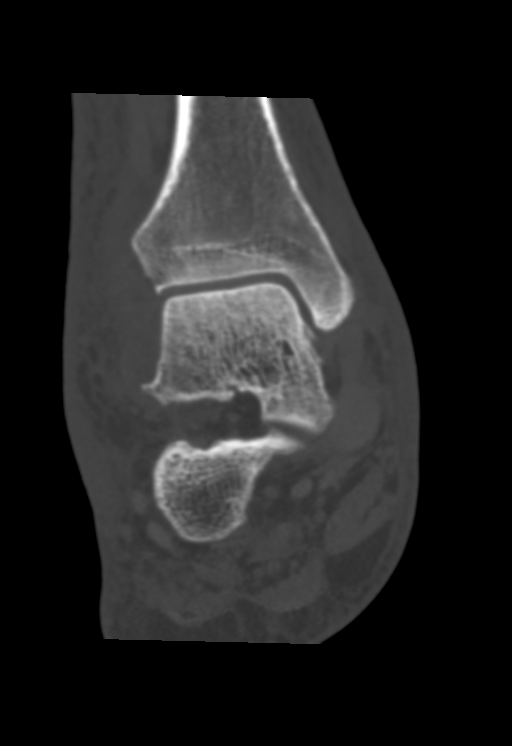

[12 of 34 positions shown; findings below may reference images not displayed]

FINDINGS: Bones/Joint/Cartilage

Generalized osteopenia.

Nondisplaced oblique fracture of the distal fibular metaphysis
without displacement or angulation. Overlying soft tissue swelling.

Well corticated bony fragments adjacent to the medial malleolus
likely reflecting sequela of old avulsive injury. Single sliver of
bone along the anteromedial malleolus may reflect a slightly more
acute avulsion fracture.

No other acute fracture or dislocation. Ankle mortise is intact.
Mild osteoarthritis of the tibiotalar joint. Mild osteoarthritis of
the second tarsometatarsal joint. Mild osteoarthritis of the first
MTP joint.

Ligaments

Suboptimally assessed by CT.

Muscles and Tendons

Muscles are normal. Flexor, extensor, peroneal and Achilles tendons
are intact. Plantar fascia is grossly intact.

Soft tissues

No fluid collection or hematoma. Soft tissue edema along the dorsal
aspect of the foot and ankle. Soft tissue edema circumferentially
around the ankle.
IMPRESSION: 1. Nondisplaced oblique fracture of the distal fibular metaphysis
without displacement or angulation. Overlying soft tissue swelling.
2. Well corticated bony fragments adjacent to the medial malleolus
likely reflecting sequela of old avulsive injury. Single sliver of
bone along the anteromedial malleolus may reflect a slightly more
acute avulsion fracture.

## 2018-11-25 IMAGING — DX DG TIBIA/FIBULA 2V*R*
4 series · 4 of 4 positions shown · non-contrast
Comparison: 12/29/2016.  11/04/2013.  06/19/2011.

CLINICAL DATA: Fall 2 days.  Swelling.

EXAM:
RIGHT TIBIA AND FIBULA - 2 VIEW

[tibia ap (1 of 2)]
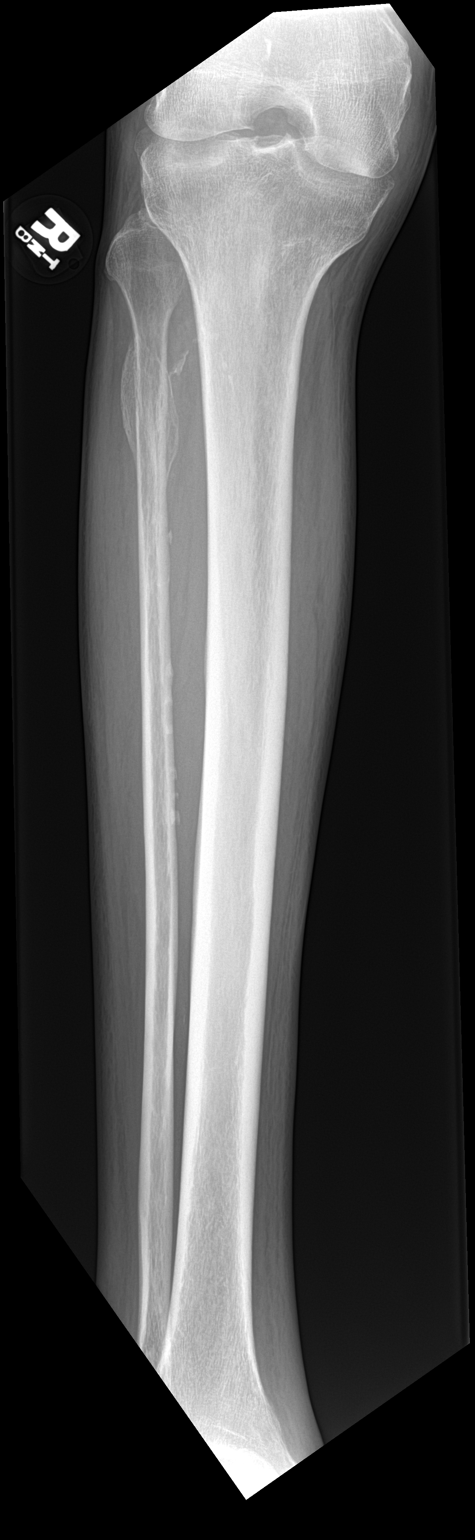

[tibia ap (2 of 2)]
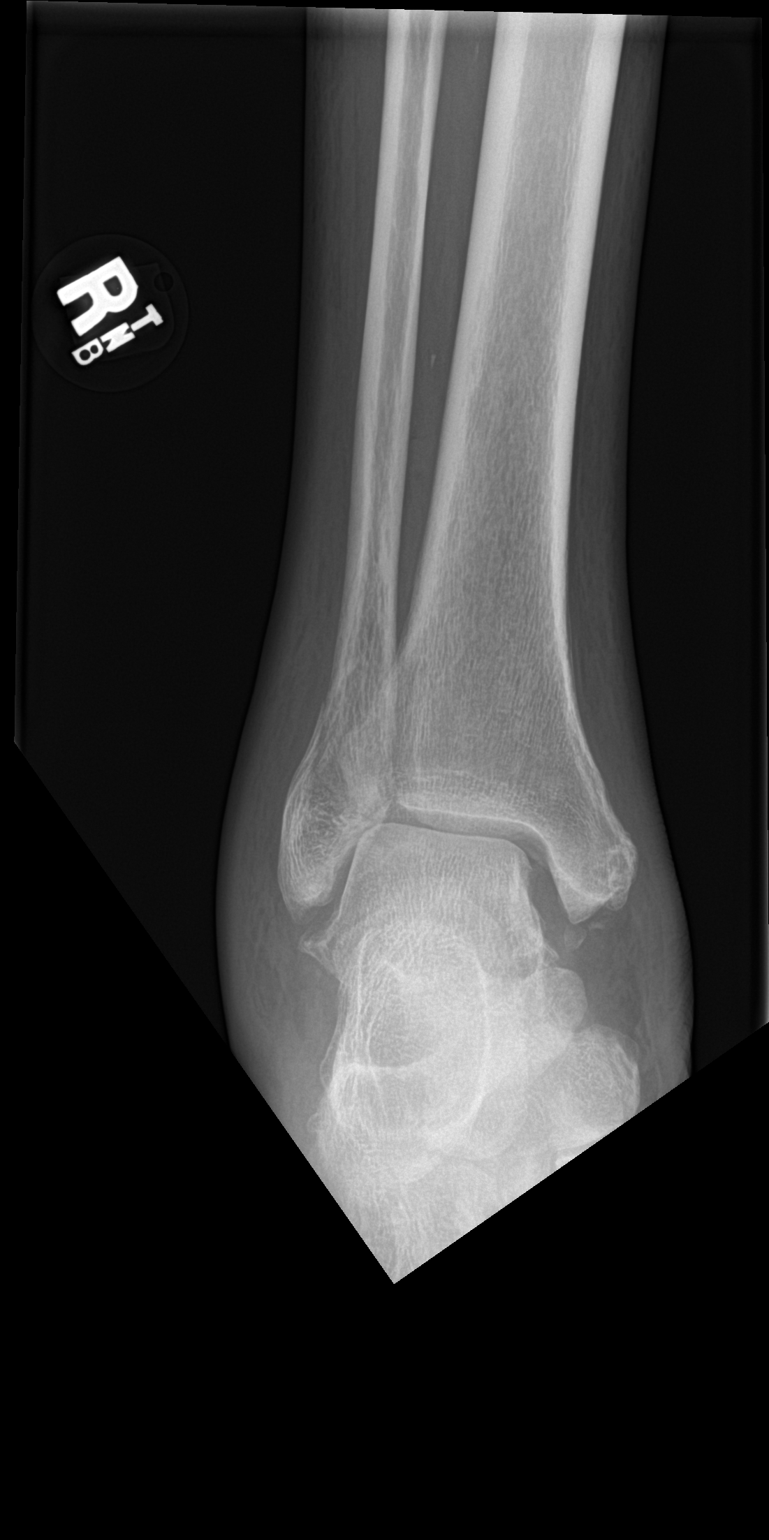

[tibia lat (1 of 2)]
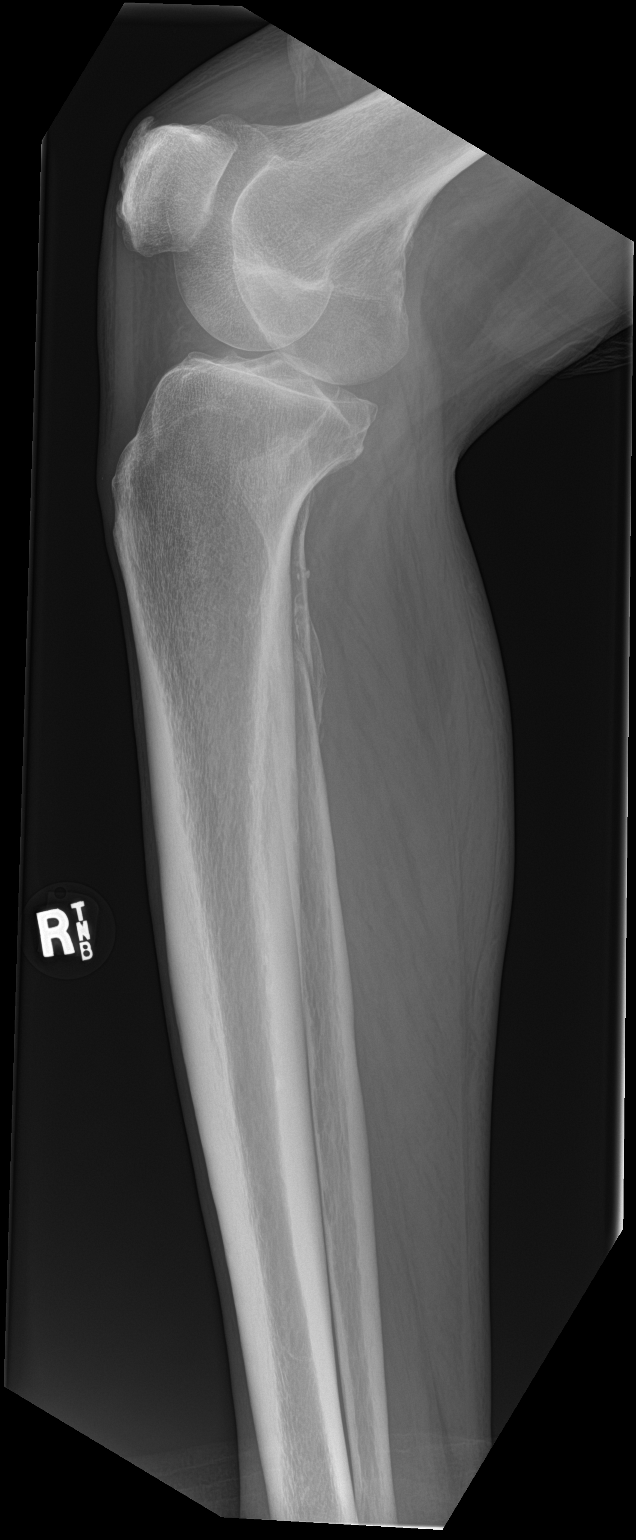

[tibia lat (2 of 2)]
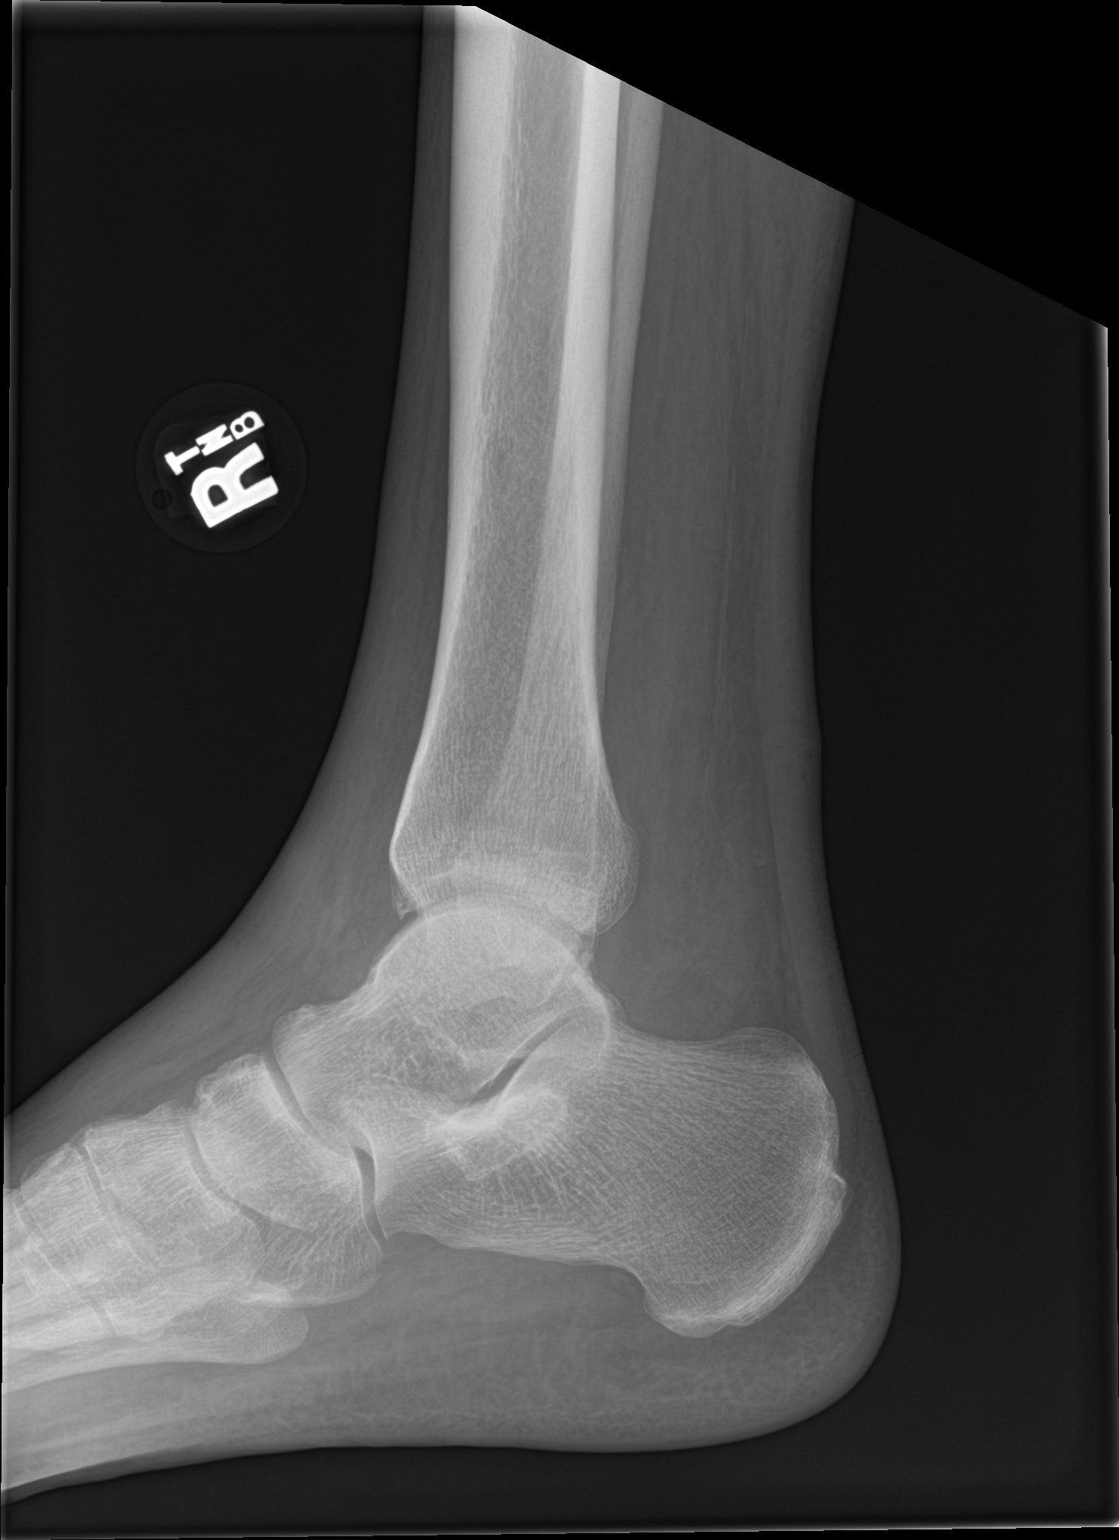

[4 of 4 positions shown; findings below may reference images not displayed]

FINDINGS: Fracture with callus formation again noted the proximal fibular
shaft. Fracture lucency remains. Tiny bony density noted adjacent to
the medial malleolus, this may represent a tiny acute avulsion
fracture. Adjacent corticated bony density is again noted in
unchanged from prior exams consistent old avulsion fracture.
Degenerative changes noted about the knee and ankle. Diffuse soft
tissue swelling noted about the ankle.
IMPRESSION: 1. Fracture with callus formation again noted about the proximal
fibular shaft. Fracture lucency remains.

2. Tiny bony density noted adjacent to the medial malleolus. This
may represent a tiny acute avulsion fracture. Adjacent corticated
bony density is again noted in unchanged position from prior exams
consistent with old avulsion fracture. Diffuse soft tissue swelling
is noted about the ankle.

## 2018-12-01 ENCOUNTER — Emergency Department: Payer: Medicare Other

## 2018-12-01 ENCOUNTER — Other Ambulatory Visit: Payer: Self-pay

## 2018-12-01 ENCOUNTER — Emergency Department
Admission: EM | Admit: 2018-12-01 | Discharge: 2018-12-01 | Disposition: A | Discharge status: Home / Self Care | Payer: Medicare Other | Attending: Emergency Medicine | Admitting: Emergency Medicine

## 2018-12-01 ENCOUNTER — Encounter: Payer: Self-pay | Admitting: Emergency Medicine

## 2018-12-01 DIAGNOSIS — W010XXA Fall on same level from slipping, tripping and stumbling without subsequent striking against object, initial encounter: Secondary | ICD-10-CM | POA: Insufficient documentation

## 2018-12-01 DIAGNOSIS — Z7982 Long term (current) use of aspirin: Secondary | ICD-10-CM | POA: Insufficient documentation

## 2018-12-01 DIAGNOSIS — Z79899 Other long term (current) drug therapy: Secondary | ICD-10-CM | POA: Diagnosis not present

## 2018-12-01 DIAGNOSIS — Z87891 Personal history of nicotine dependence: Secondary | ICD-10-CM | POA: Diagnosis not present

## 2018-12-01 DIAGNOSIS — Y9301 Activity, walking, marching and hiking: Secondary | ICD-10-CM | POA: Diagnosis not present

## 2018-12-01 DIAGNOSIS — Y999 Unspecified external cause status: Secondary | ICD-10-CM | POA: Insufficient documentation

## 2018-12-01 DIAGNOSIS — Y929 Unspecified place or not applicable: Secondary | ICD-10-CM | POA: Diagnosis not present

## 2018-12-01 DIAGNOSIS — Z96612 Presence of left artificial shoulder joint: Secondary | ICD-10-CM | POA: Insufficient documentation

## 2018-12-01 DIAGNOSIS — S42302A Unspecified fracture of shaft of humerus, left arm, initial encounter for closed fracture: Secondary | ICD-10-CM | POA: Diagnosis not present

## 2018-12-01 DIAGNOSIS — I10 Essential (primary) hypertension: Secondary | ICD-10-CM | POA: Diagnosis not present

## 2018-12-01 DIAGNOSIS — M79603 Pain in arm, unspecified: Secondary | ICD-10-CM

## 2018-12-01 DIAGNOSIS — S4992XA Unspecified injury of left shoulder and upper arm, initial encounter: Secondary | ICD-10-CM | POA: Diagnosis present

## 2018-12-01 MED ORDER — OXYCODONE-ACETAMINOPHEN 5-325 MG PO TABS: 1.0000 | ORAL_TABLET | Freq: Four times a day (QID) | ORAL | 0 refills | Status: AC | PRN

## 2018-12-01 MED ORDER — OXYCODONE-ACETAMINOPHEN 5-325 MG PO TABS
1.0000 | ORAL_TABLET | Freq: Once | ORAL | Status: AC
  Administered 2018-12-01: 1 via ORAL
  Filled 2018-12-01: qty 1

## 2018-12-01 NOTE — ED Triage Notes (Signed)
Presents via EMS  S/p fall  Having pain to left upper arm

## 2018-12-01 NOTE — Discharge Instructions (Addendum)
You have a fracture in your humerus that is right next to your previous shoulder replacement.  I would recommend following up with your previous surgeon Dr. Griffin Basil in [place].  If you are unable to follow-up with him, you can follow-up with Dr. Posey Pronto here in [place].  Please call them on Monday for an appointment this week.

## 2018-12-01 NOTE — ED Provider Notes (Signed)
[facility] Emergency Department Provider Note  ____________________________________________  Time seen: Approximately 2:44 PM  I have reviewed the triage vital signs and the nursing notes.   HISTORY  Chief Complaint Fall    HPI Tristan Cook is a 58 y.o. male that presents to the emergency department for evaluation of left arm pain after fall today.  Patient states that he was out walking and tripped and fell.  He states that he has problems with neuropathy, which he has seen primary care for several times previously.  Patient landed on his left shoulder.  He did not hit his head or lose consciousness.  He had a shoulder replacement in March with Dr. Griffin Basil. Patient is right-handed.  No numbness, tingling.   Past Medical History:  Diagnosis Date  . Abdominal pain   . Adenomatous colon polyp 2008  . Arthritis   . Back pain   . Bilateral lumbar radiculopathy   . Blurred vision, bilateral   . Chronic back pain   . Constipation   . Depression   . GERD (gastroesophageal reflux disease)   . Gout   . Hemorrhoids   . High cholesterol   . Hypertension   . Neck pain   . Neuropathy   . Neuropathy of both feet   . Numbness    rectal  . Numbness of legs   . Stroke ([place]) 2019  . Substance abuse ([place])    h/o excessive alcohol use; pt says he limits use to one to 2 beers daily he currently    Patient Active Problem List   Diagnosis Date Noted  . Alcohol intoxication ([place]) 08/27/2018  . Hallucinations 08/27/2018  . MDD (major depressive disorder), recurrent, severe, with psychosis ([place]) 08/27/2018  . Alcohol use disorder, severe, dependence ([place]) 08/27/2018  . TIA (transient ischemic attack) 05/29/2018  . Syncope 05/29/2018  . Alcohol abuse 05/29/2018  . Fall 05/29/2018  . Dislocation of left shoulder joint 05/29/2018  . Closed fracture of left proximal humerus 05/29/2018  . Abnormal LFTs 05/29/2018  . Hypokalemia 05/29/2018  . Constipation  10/17/2017  . Hoarseness 10/17/2017  . Weakness 04/11/2017  . Weakness of both legs 04/10/2017  . Vitamin B deficiency 01/22/2017  . 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 Stroke, small vessel ([place]) 01/17/2017  . Alcoholic peripheral neuropathy ([place]) 01/17/2017  . Vitamin B12 deficiency neuropathy ([place]) 01/17/2017  . History of colonic polyps 06/14/2016  . Elevated blood pressure reading 06/14/2016  . Hemorrhoids 09/20/2015  . Depression 08/03/2015  . Back pain with radiation 06/15/2015  . Insomnia 06/03/2015  . Hyperlipidemia LDL goal <130 04/25/2015  . 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 Fatigue 03/29/2015  . Allergic rhinitis 02/10/2015  . Esophageal stricture 10/22/2014  . Overweight (BMI 25.0-29.9) 10/06/2013  . GERD (gastroesophageal reflux disease) 04/23/2012  . Hemorrhoids, internal 11/02/2011  . Essential hypertension 07/25/2007  . Neck pain on left side 07/25/2007    Past Surgical History:  Procedure Laterality Date  . CATARACT EXTRACTION W/PHACO Right 08/16/2018   Procedure: CATARACT EXTRACTION PHACO AND INTRAOCULAR LENS PLACEMENT RIGHT EYE;  Surgeon: Baruch Goldmann, MD;  Location: [facility];  Service: Ophthalmology;  Laterality: Right;  CDE: 4.18  . COLONOSCOPY  10/09/2006   3 mm pedunculated sigmoid colon polyp removed/8-mm sessile hepatic flexure polyp (tubular villous adenoma) removed/6-mm  descending colon polyp removed/small internal hemorrhoids  . COLONOSCOPY  02/28/2011   [id number], multiple in the rectum/Internal hemorrhoids, MODERATE-CAUSING RECTAL BLEEDING  . COLONOSCOPY N/A 07/05/2016   Procedure: COLONOSCOPY;  Surgeon: [name], MD;  [facility];  Service: Endoscopy;  Laterality: N/A;  11:15am  . 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 Mild erosive gastrtitis most likely due to indomethacin   . FINGER SURGERY Right    4th and 5th digit  . FLEXIBLE SIGMOIDOSCOPY  01/02/2012   SLF: 1. the  colonic mucosa appeared normal in the sigmoid colon 2. Large internal hemorrhoids: cause for rectal bleeding/pain . s/p banding X 3   . HAND SURGERY    . REVERSE SHOULDER ARTHROPLASTY Left 05/29/2018   Procedure: REVERSE SHOULDER ARTHROPLASTY;  Surgeon: Hiram Gash, MD;  Location: [place];  Service: Orthopedics;  Laterality: Left;  . SAVORY DILATION N/A 07/21/2014   Procedure: SAVORY DILATION;  Surgeon: [name], MD;  [facility];  Service: Endoscopy;  Laterality: N/A;    Prior to Admission medications   Medication Sig Start Date End Date Taking? Authorizing Provider  amLODipine (NORVASC) 5 MG tablet Take 1 tablet (5 mg total) by mouth daily. 09/04/18   Clapacs, Madie Reno, MD  aspirin EC 81 MG tablet Take 1 tablet (81 mg total) by mouth daily. 09/04/18   Clapacs, Madie Reno, MD  metoprolol tartrate (LOPRESSOR) 25 MG tablet Take 1 tablet (25 mg total) by mouth 2 (two) times daily. 09/04/18   Clapacs, Madie Reno, MD  oxyCODONE-acetaminophen (PERCOCET) 5-325 MG tablet Take 1 tablet by mouth every 6 (six) hours as needed for up to 4 days for severe pain. 12/01/18 12/05/18  Laban Emperor, PA-C    Allergies Hydrocodone and Benadryl [diphenhydramine hcl (sleep)]  Family History  Problem Relation Age of Onset  . Hypertension Mother   . Hypertension Father   . Stroke Father   . Hypertension Sister   . Hypertension Brother   . Cancer Brother   . Cancer Brother        throat cancer  . Hypertension Brother   . Diabetes Maternal Grandmother   . Colon cancer Neg Hx     Social History Social History   Tobacco Use  . Smoking status: Former Smoker    Packs/day: 1.50    Years: 33.00    Pack years: 49.50    Types: Cigarettes    Quit date: 08/16/2004    Years since quitting: 14.3  . Smokeless tobacco: Current User    Types: Snuff  . Tobacco comment: Dips every once in a while  Substance Use Topics  . Alcohol use: Yes    Alcohol/week: 3.0 standard drinks    Types: 3 Cans of beer per week     Comment: drinks 6 pack of beer on weekends  . Drug use: No     Review of Systems  Respiratory: No SOB. Gastrointestinal: No abdominal pain.  No nausea, no vomiting.  Musculoskeletal: Positive for arm pain. Skin: Negative for rash, abrasions, lacerations, ecchymosis. Neurological: Negative for numbness or tingling   ____________________________________________   PHYSICAL EXAM:  VITAL SIGNS: ED Triage Vitals  Enc Vitals Group     BP 12/01/18 1158 (!) 151/94     Pulse Rate 12/01/18 1158 96     Resp 12/01/18 1158 16     Temp 12/01/18 1158 98.4 F (36.9 C)     Temp Source 12/01/18 1158 Oral     SpO2 12/01/18 1158 99 %     Weight 12/01/18 1200 180 lb (81.6 kg)     Height 12/01/18 1200 6\' 2"  (1.88 m)  Head Circumference --      Peak Flow --      Pain Score 12/01/18 1200 8     Pain Loc --      Pain Edu? --      Excl. in [place]? --      Constitutional: Alert and oriented. Well appearing and in no acute distress. Eyes: Conjunctivae are normal. PERRL. EOMI. Head: Atraumatic. ENT:      Ears:      Nose: No congestion/rhinnorhea.      Mouth/Throat: Mucous membranes are moist.  Neck: No stridor.  Cardiovascular: Normal rate, regular rhythm.  Good peripheral circulation.  Symmetric radial pulses bilaterally. Respiratory: Normal respiratory effort without tachypnea or retractions. Lungs CTAB. Good air entry to the bases with no decreased or absent breath sounds. Musculoskeletal: Full range of motion to all extremities. No gross deformities appreciated.  Tenderness to palpation to mid humerus.  Grip strength intact. Neurologic:  Normal speech and language. No gross focal neurologic deficits are appreciated.  Skin:  Skin is warm, dry and intact. No rash noted. Psychiatric: Mood and affect are normal. Speech and behavior are normal. Patient exhibits appropriate insight and judgement.   ____________________________________________   LABS (all labs ordered are listed, but only  abnormal results are displayed)  Labs Reviewed - No data to display ____________________________________________  EKG   ____________________________________________  RADIOLOGY Robinette Haines, personally viewed and evaluated these images (plain radiographs) as part of my medical decision making, as well as reviewing the written report by the radiologist.  Dg Humerus Left  Result Date: 12/01/2018 CLINICAL DATA:  Fall, pain EXAM: LEFT HUMERUS - 2+ VIEW COMPARISON:  05/29/2018 FINDINGS: Status post reverse left shoulder total arthroplasty. There is a slightly angulated, oblique periprosthetic fracture about the distal tip of the humeral prosthetic component in the mid diaphysis of the left humerus. Soft tissues are unremarkable. IMPRESSION: Status post reverse left shoulder total arthroplasty. There is a slightly angulated, oblique periprosthetic fracture about the distal tip of the humeral prosthetic component in the mid diaphysis of the left humerus. Electronically Signed   By: Eddie Candle M.D.   On: 12/01/2018 13:41    ____________________________________________    PROCEDURES  Procedure(s) performed:    Procedures    Medications  oxyCODONE-acetaminophen (PERCOCET/ROXICET) 5-325 MG per tablet 1 tablet (1 tablet Oral Given 12/01/18 1456)     ____________________________________________   INITIAL IMPRESSION / ASSESSMENT AND PLAN / ED COURSE  Pertinent labs & imaging results that were available during my care of the patient were reviewed by me and considered in my medical decision making (see chart for details).  Review of the [place] CSRS was performed in accordance of the [place] prior to dispensing any controlled drugs.   Patient's diagnosis is consistent with mid humerus fracture status post left shoulder total arthroplasty.  X-ray consistent with fracture.  Vital signs and exam are reassuring.  Dr. Donivan Scull was called and consulted about case and imaging.  He recommends that  patient be placed in a coaptation splint with a sling and to follow-up with Dr. Harlow Mares or Dr. Posey Pronto in outpatient clinic if he is unable to follow-up with his surgeon in [place]. Patient is agreeable.  Patient will be discharged home with prescriptions for Percocet. Patient is to follow up with Ortho as directed. Patient is given ED precautions to return to the ED for any worsening or new symptoms.  Tristan Cook was evaluated in Emergency Department on 12/01/2018 for the symptoms described in the  history of present illness. He was evaluated in the context of the global COVID-19 pandemic, which necessitated consideration that the patient might be at risk for infection with the SARS-CoV-2 virus that causes COVID-19. Institutional protocols and algorithms that pertain to the evaluation of patients at risk for COVID-19 are in a state of rapid change based on information released by regulatory bodies including the CDC and federal and state organizations. These policies and algorithms were followed during the patient's care in the ED.   ____________________________________________  FINAL CLINICAL IMPRESSION(S) / ED DIAGNOSES  Final diagnoses:  Closed fracture of shaft of left humerus, unspecified fracture morphology, initial encounter  Status post total replacement of left shoulder      NEW MEDICATIONS STARTED DURING THIS VISIT:  ED Discharge Orders         Ordered    oxyCODONE-acetaminophen (PERCOCET) 5-325 MG tablet  Every 6 hours PRN     12/01/18 1543              This chart was dictated using voice recognition software/Dragon. Despite best efforts to proofread, errors can occur which can change the meaning. Any change was purely unintentional.    Laban Emperor, PA-C 12/01/18 1736    Lavonia Drafts, MD 12/03/18 [phone]

## 2019-01-13 ENCOUNTER — Encounter: Payer: Self-pay | Admitting: Emergency Medicine

## 2019-01-13 ENCOUNTER — Other Ambulatory Visit: Payer: Self-pay

## 2019-01-13 ENCOUNTER — Emergency Department
Admission: EM | Admit: 2019-01-13 | Discharge: 2019-01-14 | Disposition: A | Discharge status: Hospice | Payer: Medicare Other | Attending: Emergency Medicine | Admitting: Emergency Medicine

## 2019-01-13 DIAGNOSIS — I10 Essential (primary) hypertension: Secondary | ICD-10-CM | POA: Diagnosis not present

## 2019-01-13 DIAGNOSIS — S42202A Unspecified fracture of upper end of left humerus, initial encounter for closed fracture: Secondary | ICD-10-CM | POA: Diagnosis present

## 2019-01-13 DIAGNOSIS — R5383 Other fatigue: Secondary | ICD-10-CM | POA: Diagnosis present

## 2019-01-13 DIAGNOSIS — F1010 Alcohol abuse, uncomplicated: Secondary | ICD-10-CM | POA: Diagnosis present

## 2019-01-13 DIAGNOSIS — E539 Vitamin B deficiency, unspecified: Secondary | ICD-10-CM | POA: Diagnosis present

## 2019-01-13 DIAGNOSIS — F1020 Alcohol dependence, uncomplicated: Secondary | ICD-10-CM | POA: Diagnosis present

## 2019-01-13 DIAGNOSIS — M549 Dorsalgia, unspecified: Secondary | ICD-10-CM | POA: Diagnosis present

## 2019-01-13 DIAGNOSIS — R55 Syncope and collapse: Secondary | ICD-10-CM | POA: Diagnosis present

## 2019-01-13 DIAGNOSIS — Z046 Encounter for general psychiatric examination, requested by authority: Secondary | ICD-10-CM | POA: Diagnosis present

## 2019-01-13 DIAGNOSIS — R945 Abnormal results of liver function studies: Secondary | ICD-10-CM | POA: Diagnosis present

## 2019-01-13 DIAGNOSIS — F10929 Alcohol use, unspecified with intoxication, unspecified: Secondary | ICD-10-CM | POA: Diagnosis present

## 2019-01-13 DIAGNOSIS — J309 Allergic rhinitis, unspecified: Secondary | ICD-10-CM | POA: Diagnosis present

## 2019-01-13 DIAGNOSIS — Z79899 Other long term (current) drug therapy: Secondary | ICD-10-CM | POA: Diagnosis not present

## 2019-01-13 DIAGNOSIS — Z20828 Contact with and (suspected) exposure to other viral communicable diseases: Secondary | ICD-10-CM | POA: Insufficient documentation

## 2019-01-13 DIAGNOSIS — E538 Deficiency of other specified B group vitamins: Secondary | ICD-10-CM | POA: Diagnosis present

## 2019-01-13 DIAGNOSIS — R4689 Other symptoms and signs involving appearance and behavior: Secondary | ICD-10-CM

## 2019-01-13 DIAGNOSIS — F333 Major depressive disorder, recurrent, severe with psychotic symptoms: Secondary | ICD-10-CM | POA: Insufficient documentation

## 2019-01-13 DIAGNOSIS — R030 Elevated blood-pressure reading, without diagnosis of hypertension: Secondary | ICD-10-CM | POA: Diagnosis present

## 2019-01-13 DIAGNOSIS — R7989 Other specified abnormal findings of blood chemistry: Secondary | ICD-10-CM | POA: Diagnosis present

## 2019-01-13 DIAGNOSIS — R443 Hallucinations, unspecified: Secondary | ICD-10-CM | POA: Diagnosis present

## 2019-01-13 DIAGNOSIS — G63 Polyneuropathy in diseases classified elsewhere: Secondary | ICD-10-CM | POA: Diagnosis present

## 2019-01-13 DIAGNOSIS — K5900 Constipation, unspecified: Secondary | ICD-10-CM | POA: Diagnosis present

## 2019-01-13 DIAGNOSIS — G459 Transient cerebral ischemic attack, unspecified: Secondary | ICD-10-CM | POA: Diagnosis present

## 2019-01-13 DIAGNOSIS — R29898 Other symptoms and signs involving the musculoskeletal system: Secondary | ICD-10-CM | POA: Diagnosis present

## 2019-01-13 DIAGNOSIS — K222 Esophageal obstruction: Secondary | ICD-10-CM

## 2019-01-13 DIAGNOSIS — F17290 Nicotine dependence, other tobacco product, uncomplicated: Secondary | ICD-10-CM | POA: Insufficient documentation

## 2019-01-13 DIAGNOSIS — R531 Weakness: Secondary | ICD-10-CM

## 2019-01-13 DIAGNOSIS — Z8673 Personal history of transient ischemic attack (TIA), and cerebral infarction without residual deficits: Secondary | ICD-10-CM | POA: Diagnosis not present

## 2019-01-13 DIAGNOSIS — W19XXXA Unspecified fall, initial encounter: Secondary | ICD-10-CM | POA: Diagnosis present

## 2019-01-13 DIAGNOSIS — S43005A Unspecified dislocation of left shoulder joint, initial encounter: Secondary | ICD-10-CM | POA: Diagnosis present

## 2019-01-13 DIAGNOSIS — R490 Dysphonia: Secondary | ICD-10-CM | POA: Diagnosis present

## 2019-01-13 DIAGNOSIS — F32A Depression, unspecified: Secondary | ICD-10-CM | POA: Diagnosis present

## 2019-01-13 DIAGNOSIS — F329 Major depressive disorder, single episode, unspecified: Secondary | ICD-10-CM | POA: Diagnosis present

## 2019-01-13 DIAGNOSIS — G621 Alcoholic polyneuropathy: Secondary | ICD-10-CM | POA: Diagnosis present

## 2019-01-13 LAB — COMPREHENSIVE METABOLIC PANEL
ALT: 29 U/L (ref 0–44)
AST: 49 U/L — ABNORMAL HIGH (ref 15–41)
Albumin: 3.7 g/dL (ref 3.5–5.0)
Alkaline Phosphatase: 113 U/L (ref 38–126)
Anion gap: 18 — ABNORMAL HIGH (ref 5–15)
BUN: <5 mg/dL — ABNORMAL LOW (ref 6–20)
CO2: 22 mmol/L (ref 22–32)
Calcium: 9.9 mg/dL (ref 8.9–10.3)
Chloride: 93 mmol/L — ABNORMAL LOW (ref 98–111)
Creatinine, Ser: 0.93 mg/dL (ref 0.61–1.24)
GFR calc Af Amer: >60 mL/min (ref >60)
GFR calc non Af Amer: >60 mL/min (ref >60)
Glucose, Bld: 123 mg/dL — ABNORMAL HIGH (ref 70–99)
Potassium: 3.4 mmol/L — ABNORMAL LOW (ref 3.5–5.1)
Sodium: 133 mmol/L — ABNORMAL LOW (ref 135–145)
Total Bilirubin: 1.6 mg/dL — ABNORMAL HIGH (ref 0.3–1.2)
Total Protein: 8.7 g/dL — ABNORMAL HIGH (ref 6.5–8.1)

## 2019-01-13 LAB — CBC
HCT: 40.3 % (ref 39.0–52.0)
Hemoglobin: 14.5 g/dL (ref 13.0–17.0)
MCH: 31.5 pg (ref 26.0–34.0)
MCHC: 36 g/dL (ref 30.0–36.0)
MCV: 87.6 fL (ref 80.0–100.0)
Platelets: 310 10*3/uL (ref 150–400)
RBC: 4.6 MIL/uL (ref 4.22–5.81)
RDW: 13.3 % (ref 11.5–15.5)
WBC: 9.8 10*3/uL (ref 4.0–10.5)
nRBC: 0 % (ref 0.0–0.2)

## 2019-01-13 LAB — ACETAMINOPHEN LEVEL: Acetaminophen (Tylenol), Serum: <10 ug/mL — ABNORMAL LOW (ref 10–30)

## 2019-01-13 LAB — ETHANOL: Alcohol, Ethyl (B): <10 mg/dL (ref <10)

## 2019-01-13 LAB — SALICYLATE LEVEL: Salicylate Lvl: <7 mg/dL (ref 2.8–30.0)

## 2019-01-13 NOTE — BH Assessment (Signed)
Per [facility] clinician Aldona Bar): "Patient believes there are people outside of his home (when it has been confirmed that there isn't anyone outside of his home). He's having delusions that his sister wants to kill him. He has not slept in 3 days. He also has not been compliant with his prescribed medications. He currently lives in a boarding house. Today the police retrieved his gun and unloaded a dozen bullets."

## 2019-01-13 NOTE — ED Notes (Signed)
Pt. Alert and oriented, warm and dry, in no distress. Pt. Denies SI, HI, and VH. Pt states hearing voices but they are not saying anything particular.  Patient states he just went to [place] because of pain, he did not know he was going to be coming here. Pt. Encouraged to let nursing staff know of any concerns or needs.

## 2019-01-13 NOTE — ED Provider Notes (Signed)
[facility] Emergency Department Provider Note   ____________________________________________   I have reviewed the triage vital signs and the nursing notes.   HISTORY  Chief Complaint Neuropathy  History limited by: Not Limited   HPI Tristan Cook is a 58 y.o. male who presents to the emergency department today under IVC from [place] because of concerns for psychosis.  The patient himself is unaware that he is here for mental health reasons.  Patient's main complaint is for neuropathy in his legs and arthritis pain.  He states he has not been taking medication for that pain.  When asked about hallucinations he states he thinks he has neuropathy in his eyes because he does sometimes see things.   Records reviewed. Per medical record review patient has a history of neuropathy, hallucinations  Past Medical History:  Diagnosis Date  . Abdominal pain   . Adenomatous colon polyp 2008  . Arthritis   . Back pain   . Bilateral lumbar radiculopathy   . Blurred vision, bilateral   . Chronic back pain   . Constipation   . Depression   . GERD (gastroesophageal reflux disease)   . Gout   . Hemorrhoids   . High cholesterol   . Hypertension   . Neck pain   . Neuropathy   . Neuropathy of both feet   . Numbness    rectal  . Numbness of legs   . Stroke ([place]) 2019  . Substance abuse ([place])    h/o excessive alcohol use; pt says he limits use to one to 2 beers daily he currently    Patient Active Problem List   Diagnosis Date Noted  . Alcohol intoxication ([place]) 08/27/2018  . Hallucinations 08/27/2018  . MDD (major depressive disorder), recurrent, severe, with psychosis ([place]) 08/27/2018  . Alcohol use disorder, severe, dependence ([place]) 08/27/2018  . TIA (transient ischemic attack) 05/29/2018  . Syncope 05/29/2018  . Alcohol abuse 05/29/2018  . Fall 05/29/2018  . Dislocation of left shoulder joint 05/29/2018  . Closed fracture of left proximal humerus  05/29/2018  . Abnormal LFTs 05/29/2018  . Hypokalemia 05/29/2018  . Constipation 10/17/2017  . Hoarseness 10/17/2017  . Weakness 04/11/2017  . Weakness of both legs 04/10/2017  . Vitamin B deficiency 01/22/2017  . 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 Stroke, small vessel ([place]) 01/17/2017  . Alcoholic peripheral neuropathy ([place]) 01/17/2017  . Vitamin B12 deficiency neuropathy ([place]) 01/17/2017  . History of colonic polyps 06/14/2016  . Elevated blood pressure reading 06/14/2016  . Hemorrhoids 09/20/2015  . Depression 08/03/2015  . Back pain with radiation 06/15/2015  . Insomnia 06/03/2015  . Hyperlipidemia LDL goal <130 04/25/2015  . 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 Fatigue 03/29/2015  . Allergic rhinitis 02/10/2015  . Esophageal stricture 10/22/2014  . Overweight (BMI 25.0-29.9) 10/06/2013  . GERD (gastroesophageal reflux disease) 04/23/2012  . Hemorrhoids, internal 11/02/2011  . Essential hypertension 07/25/2007  . Neck pain on left side 07/25/2007    Past Surgical History:  Procedure Laterality Date  . CATARACT EXTRACTION W/PHACO Right 08/16/2018   Procedure: CATARACT EXTRACTION PHACO AND INTRAOCULAR LENS PLACEMENT RIGHT EYE;  Surgeon: Baruch Goldmann, MD;  Location: [facility];  Service: Ophthalmology;  Laterality: Right;  CDE: 4.18  . COLONOSCOPY  10/09/2006   3 mm pedunculated sigmoid colon polyp removed/8-mm sessile hepatic flexure polyp (tubular villous adenoma) removed/6-mm  descending colon polyp removed/small internal hemorrhoids  . COLONOSCOPY  02/28/2011   [id number], multiple in the rectum/Internal hemorrhoids, MODERATE-CAUSING RECTAL BLEEDING  .  COLONOSCOPY N/A 07/05/2016   Procedure: COLONOSCOPY;  Surgeon: [name], MD;  [facility];  Service: Endoscopy;  Laterality: N/A;  11:15am  . 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 Mild erosive gastrtitis most likely due to indomethacin   . FINGER SURGERY  Right    4th and 5th digit  . FLEXIBLE SIGMOIDOSCOPY  01/02/2012   SLF: 1. the colonic mucosa appeared normal in the sigmoid colon 2. Large internal hemorrhoids: cause for rectal bleeding/pain . s/p banding X 3   . HAND SURGERY    . REVERSE SHOULDER ARTHROPLASTY Left 05/29/2018   Procedure: REVERSE SHOULDER ARTHROPLASTY;  Surgeon: Hiram Gash, MD;  Location: [place];  Service: Orthopedics;  Laterality: Left;  . SAVORY DILATION N/A 07/21/2014   Procedure: SAVORY DILATION;  Surgeon: [name], MD;  [facility];  Service: Endoscopy;  Laterality: N/A;    Prior to Admission medications   Medication Sig Start Date End Date Taking? Authorizing Provider  amLODipine (NORVASC) 5 MG tablet Take 1 tablet (5 mg total) by mouth daily. 09/04/18   Clapacs, Madie Reno, MD  aspirin EC 81 MG tablet Take 1 tablet (81 mg total) by mouth daily. 09/04/18   Clapacs, Madie Reno, MD  metoprolol tartrate (LOPRESSOR) 25 MG tablet Take 1 tablet (25 mg total) by mouth 2 (two) times daily. 09/04/18   Clapacs, Madie Reno, MD    Allergies Hydrocodone and Benadryl [diphenhydramine hcl (sleep)]  Family History  Problem Relation Age of Onset  . Hypertension Mother   . Hypertension Father   . Stroke Father   . Hypertension Sister   . Hypertension Brother   . Cancer Brother   . Cancer Brother        throat cancer  . Hypertension Brother   . Diabetes Maternal Grandmother   . Colon cancer Neg Hx     Social History Social History   Tobacco Use  . Smoking status: Former Smoker    Packs/day: 1.50    Years: 33.00    Pack years: 49.50    Types: Cigarettes    Quit date: 08/16/2004    Years since quitting: 14.4  . Smokeless tobacco: Current User    Types: Snuff  . Tobacco comment: Dips every once in a while  Substance Use Topics  . Alcohol use: Yes    Alcohol/week: 3.0 standard drinks    Types: 3 Cans of beer per week    Comment: drinks 6 pack of beer on weekends  . Drug use: No    Review of  Systems Constitutional: No fever/chills Eyes: No visual changes. ENT: No sore throat. Cardiovascular: Denies chest pain. Respiratory: Denies shortness of breath. Gastrointestinal: No abdominal pain.  No nausea, no vomiting.  No diarrhea.   Genitourinary: Negative for dysuria. Musculoskeletal: Positive for joint pain, neuropathy Skin: Negative for rash. Neurological: Negative for headaches, focal weakness or numbness.  ____________________________________________   PHYSICAL EXAM:  VITAL SIGNS: ED Triage Vitals [01/13/19 1923]  Enc Vitals Group     BP (!) 148/94     Pulse Rate (!) 120     Resp 20     Temp 98.1 F (36.7 C)     Temp Source Oral     SpO2 99 %     Weight 180 lb (81.6 kg)     Height 6\' 2"  (1.88 m)     Head Circumference      Peak Flow  Pain Score 8   Constitutional: Alert and oriented.  Eyes: Conjunctivae are normal.  ENT      Head: Normocephalic and atraumatic.      Nose: No congestion/rhinnorhea.      Mouth/Throat: Mucous membranes are moist.      Neck: No stridor. Hematological/Lymphatic/Immunilogical: No cervical lymphadenopathy. Cardiovascular: Normal rate, regular rhythm.  No murmurs, rubs, or gallops.  Respiratory: Normal respiratory effort without tachypnea nor retractions. Breath sounds are clear and equal bilaterally. No wheezes/rales/rhonchi. Gastrointestinal: Soft and non tender. No rebound. No guarding.  Genitourinary: Deferred Musculoskeletal: Normal range of motion in all extremities. No lower extremity edema. Neurologic:  Normal speech and language. No gross focal neurologic deficits are appreciated.  Skin:  Skin is warm, dry and intact. No rash noted. Psychiatric: Mood and affect are normal. Speech and behavior are normal. Patient exhibits appropriate insight and judgment.  ____________________________________________    LABS (pertinent positives/negatives)  Acetaminophen, salicylate, ethanol below threshold CBC wbc 9.8, hgb 14.5,  plt 310 CMP na 133, k 3.4, glu 123, bun <5, cr 0.93  ____________________________________________   EKG  None  ____________________________________________    RADIOLOGY  None  ____________________________________________   PROCEDURES  Procedures  ____________________________________________   INITIAL IMPRESSION / ASSESSMENT AND PLAN / ED COURSE  Pertinent labs & imaging results that were available during my care of the patient were reviewed by me and considered in my medical decision making (see chart for details).   Patient presented to the emergency department today under IVC because of concerns for psychosis and abnormal behavior.  On exam patient appears calm.  Does not appear to be responding to any internal stimuli.  Patient was evaluated by [facility]ry at [facility]. Will have TTS evaluate.    ____________________________________________   FINAL CLINICAL IMPRESSION(S) / ED DIAGNOSES  Abnormal behavior  Note: This dictation was prepared with Dragon dictation. Any transcriptional errors that result from this process are unintentional     Nance Pear, MD 01/13/19 2240

## 2019-01-13 NOTE — ED Notes (Addendum)
Pt removed blue cap, orange sweatshirt, black shoes--all placed in labeled pt belonging bag to be secured on nursing unit & pt changed into hosp gown; pt to keep own glasses, black sweatpants, and cane and velcro splint (to left upper arm for fracture support)

## 2019-01-13 NOTE — ED Triage Notes (Addendum)
Pt to triage via w/c, with no distress noted, mask in place; brought in by [facility] for IVC; pt st he doesn't know why he is here and denies SI or HI; st "I thought I was coming here for pain management"

## 2019-01-14 DIAGNOSIS — F323 Major depressive disorder, single episode, severe with psychotic features: Secondary | ICD-10-CM

## 2019-01-14 LAB — URINE DRUG SCREEN, QUALITATIVE (ARMC ONLY)
Amphetamines, Ur Screen: NOT DETECTED
Barbiturates, Ur Screen: NOT DETECTED
Benzodiazepine, Ur Scrn: NOT DETECTED
Cannabinoid 50 Ng, Ur ~~LOC~~: NOT DETECTED
Cocaine Metabolite,Ur ~~LOC~~: NOT DETECTED
MDMA (Ecstasy)Ur Screen: NOT DETECTED
Methadone Scn, Ur: NOT DETECTED
Opiate, Ur Screen: NOT DETECTED
Phencyclidine (PCP) Ur S: NOT DETECTED
Tricyclic, Ur Screen: NOT DETECTED

## 2019-01-14 LAB — SARS CORONAVIRUS 2 BY RT PCR (HOSPITAL ORDER, PERFORMED IN ~~LOC~~ HOSPITAL LAB): SARS Coronavirus 2: NEGATIVE

## 2019-01-14 NOTE — ED Notes (Signed)
EMTALA reviewed. 

## 2019-01-14 NOTE — ED Notes (Signed)
Pt given phone to talk to bank about pt's card being used.

## 2019-01-14 NOTE — ED Notes (Signed)
Patient moved to room 19 to preform IN and OUT cath for urine.

## 2019-01-14 NOTE — Consult Note (Signed)
[place] [facility] Consult   Reason for Consult: Neuropathy Referring Physician: Dr. Archie Balboa Patient Identification: Tristan Cook MRN:  [id number] Principal Diagnosis: <principal problem not specified> Diagnosis:  Active Problems:   Essential hypertension   Esophageal stricture   Allergic rhinitis   Fatigue   Back pain with radiation   Depression   Elevated blood pressure reading   Alcoholic peripheral neuropathy (HCC)   Vitamin B12 deficiency neuropathy (HCC)   Vitamin B deficiency   Weakness of both legs   Weakness   Constipation   Hoarseness   TIA (transient ischemic attack)   Syncope   Alcohol abuse   Fall   Dislocation of left shoulder joint   Closed fracture of left proximal humerus   Abnormal LFTs   Alcohol intoxication ([place])   Hallucinations   MDD (major depressive disorder), recurrent, severe, with psychosis ([place])   Alcohol use disorder, severe, dependence ([place])   Total Time spent with patient: 45 minutes  Subjective: "I am hearing voices.  I believe it is my sister and her boyfriend who are trying to hurt me." Tristan Cook is a 58 y.o. male patient presented to [facility] ED via law enforcement under involuntary commitment status (IVC) by way of [facility]. The patient was seen at [facility] due to him presenting with psychotic and depressive symptoms. The patient presented to [place] by stating he has not been eating, sleeping for several days.  He voiced that he believes his sister and the sister's boyfriend are trying to break into his home to harm him.  The patient expressed he hears the voices saying to kill him, murder him.  It was disclosed that when the patient called the police to his room, they saw him with a loaded gun.  The police unloaded his gun and left the weapon on his bed, but the bullets were taken away.   The patient was seen face-to-face by this provider; the chart reviewed and consulted with Dr. Alfred Levins on 01/14/2019 due to the patient's care. It was  discussed with the EDP that the patient does meet the criteria to be admitted to the psychiatric inpatient unit.  The patient is alert and oriented x 3, calm and cooperative, and mood-congruent with affect on evaluation. The patient does not appear to be responding to internal or external stimuli.  The patient is presenting with delusional thinking.  He voiced his sister for the last year, and her boyfriend has been terrorizing him by trying to break into his house.  The patient expressed, "I am going to kill him; I am going to murder him."  The patient admits to auditory and visual hallucinations. The patient denies any suicidal or self-harm ideations. The patient is presenting with psychotic and paranoid behaviors. During an encounter with the patient, he was able to answer some questions appropriately. Plan: The patient is a safety risk to self and others and does require psychiatric inpatient admission for stabilization and treatment. HPI: Per Dr. Archie Balboa; Tristan Cook is a 58 y.o. male who presents to the emergency department today under IVC from [place] because of concerns for psychosis.  The patient himself is unaware that he is here for mental health reasons.  Patient's main complaint is for neuropathy in his legs and arthritis pain.  He states he has not been taking medication for that pain.  When asked about hallucinations he states he thinks he has neuropathy in his eyes because he does sometimes see things.  Past Psychiatric History:  Hallucinations  Risk to Self:  Yes Risk to Others:  Yes Prior Inpatient Therapy:  Unknown Prior Outpatient Therapy:  Yes  Past Medical History:  Past Medical History:  Diagnosis Date  . Abdominal pain   . Adenomatous colon polyp 2008  . Arthritis   . Back pain   . Bilateral lumbar radiculopathy   . Blurred vision, bilateral   . Chronic back pain   . Constipation   . Depression   . GERD (gastroesophageal reflux disease)   . Gout   . Hemorrhoids    . High cholesterol   . Hypertension   . Neck pain   . Neuropathy   . Neuropathy of both feet   . Numbness    rectal  . Numbness of legs   . Stroke ([place]) 2019  . Substance abuse ([place])    h/o excessive alcohol use; pt says he limits use to one to 2 beers daily he currently    Past Surgical History:  Procedure Laterality Date  . CATARACT EXTRACTION W/PHACO Right 08/16/2018   Procedure: CATARACT EXTRACTION PHACO AND INTRAOCULAR LENS PLACEMENT RIGHT EYE;  Surgeon: Baruch Goldmann, MD;  Location: [facility] OR[facility];  Service: Ophthalmology;  Laterality: Right;  CDE: 4.18  . COLONOSCOPY  10/09/2006   3 mm pedunculated sigmoid colon polyp removed/8-mm sessile hepatic flexure polyp (tubular villous adenoma) removed/6-mm  descending colon polyp removed/small internal hemorrhoids  . COLONOSCOPY  02/28/2011   [id number], multiple in the rectum/Internal hemorrhoids, MODERATE-CAUSING RECTAL BLEEDING  . COLONOSCOPY N/A 07/05/2016   Procedure: COLONOSCOPY;  Surgeon: [name], MD;  Location: [facility] END[facility] SUITE;  Service: Endoscopy;  Laterality: N/A;  11:15am  . 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 Mild erosive gastrtitis most likely due to indomethacin   . FINGER SURGERY Right    4th and 5th digit  . FLEXIBLE SIGMOIDOSCOPY  01/02/2012   SLF: 1. the colonic mucosa appeared normal in the sigmoid colon 2. Large internal hemorrhoids: cause for rectal bleeding/pain . s/p banding X 3   . HAND SURGERY    . REVERSE SHOULDER ARTHROPLASTY Left 05/29/2018   Procedure: REVERSE SHOULDER ARTHROPLASTY;  Surgeon: Hiram Gash, MD;  Location: [facility];  Service: Orthopedics;  Laterality: Left;  . SAVORY DILATION N/A 07/21/2014   Procedure: SAVORY DILATION;  Surgeon: [name], MD;  Location: [facility];  Service: Endoscopy;  Laterality: N/A;   Family History:  Family History  Problem Relation Age of Onset  . Hypertension Mother   . Hypertension Father   .  Stroke Father   . Hypertension Sister   . Hypertension Brother   . Cancer Brother   . Cancer Brother        throat cancer  . Hypertension Brother   . Diabetes Maternal Grandmother   . Colon cancer Neg Hx    Family Psychiatric  History: Unknown Social History:  Social History   Substance and Sexual Activity  Alcohol Use Yes  . Alcohol/week: 3.0 standard drinks  . Types: 3 Cans of beer per week   Comment: drinks 6 pack of beer on weekends     Social History   Substance and Sexual Activity  Drug Use No    Social History   Socioeconomic History  . Marital status: Single    Spouse name: Not on file  . Number of children: 0  . Years of education: 53  . Highest education level: Not on file  Occupational History  . Occupation: unemployed,  [employer]    Employer: UNEMPLOYED  Social Needs  . Financial resource strain: Not on file  . Food insecurity    Worry: Not on file    Inability: Not on file  . Transportation needs    Medical: No    Non-medical: No  Tobacco Use  . Smoking status: Former Smoker    Packs/day: 1.50    Years: 33.00    Pack years: 49.50    Types: Cigarettes    Quit date: 08/16/2004    Years since quitting: 14.4  . Smokeless tobacco: Current User    Types: Snuff  . Tobacco comment: Dips every once in a while  Substance and Sexual Activity  . Alcohol use: Yes    Alcohol/week: 3.0 standard drinks    Types: 3 Cans of beer per week    Comment: drinks 6 pack of beer on weekends  . Drug use: No  . Sexual activity: Not on file  Lifestyle  . Physical activity    Days per week: Not on file    Minutes per session: Not on file  . Stress: Not on file  Relationships  . Social Herbalist on phone: Not on file    Gets together: Not on file    Attends religious service: Not on file    Active member of club or organization: Not on file    Attends meetings of clubs or organizations: Not on file    Relationship status: Not on file  Other Topics  Concern  . Not on file  Social History Narrative   Lives alone   No caffeine   Right handed   Additional Social History:    Allergies:   Allergies  Allergen Reactions  . Hydrocodone     hallucinations from hydrocodone  . Benadryl [Diphenhydramine Hcl (Sleep)] Other (See Comments)    Palpitations    Labs:  Results for orders placed or performed during the hospital encounter of 01/13/19 (from the past 48 hour(s))  Comprehensive metabolic panel     Status: Abnormal   Collection Time: 01/13/19  7:26 PM  Result Value Ref Range   Sodium 133 (L) 135 - 145 mmol/L   Potassium 3.4 (L) 3.5 - 5.1 mmol/L   Chloride 93 (L) 98 - 111 mmol/L   CO2 22 22 - 32 mmol/L   Glucose, Bld 123 (H) 70 - 99 mg/dL   BUN <5 (L) 6 - 20 mg/dL   Creatinine, Ser 0.93 0.61 - 1.24 mg/dL   Calcium 9.9 8.9 - 10.3 mg/dL   Total Protein 8.7 (H) 6.5 - 8.1 g/dL   Albumin 3.7 3.5 - 5.0 g/dL   AST 49 (H) 15 - 41 U/L   ALT 29 0 - 44 U/L   Alkaline Phosphatase 113 38 - 126 U/L   Total Bilirubin 1.6 (H) 0.3 - 1.2 mg/dL   GFR calc non Af Amer >60 >60 mL/min   GFR calc Af Amer >60 >60 mL/min   Anion gap 18 (H) 5 - 15    Comment: Performed at [facility], [address] [place], [place] [address]  Ethanol     Status: None   Collection Time: 01/13/19  7:26 PM  Result Value Ref Range   Alcohol, Ethyl (B) <10 <10 mg/dL    Comment: (NOTE) Lowest detectable limit for serum alcohol is 10 mg/dL. For medical purposes only. Performed at [facility], [address], [place], [place] [id number]   Salicylate level  Status: None   Collection Time: 01/13/19  7:26 PM  Result Value Ref Range   Salicylate Lvl [id number] 2.8 - 30.0 mg/dL    Comment: Performed at [facility], [address] [place], [place] [address]  Acetaminophen level     Status: Abnormal   Collection Time: 01/13/19  7:26 PM  Result Value Ref Range   Acetaminophen (Tylenol), Serum <10 (L) 10 - 30 ug/mL    Comment:  (NOTE) Therapeutic concentrations vary significantly. A range of 10-30 ug/mL  may be an effective concentration for many patients. However, some  are best treated at concentrations outside of this range. Acetaminophen concentrations >150 ug/mL at 4 hours after ingestion  and >50 ug/mL at 12 hours after ingestion are often associated with  toxic reactions. Performed at [facility], [address] [place], [place] [address]   cbc     Status: None   Collection Time: 01/13/19  7:26 PM  Result Value Ref Range   WBC 9.8 4.0 - 10.5 K/uL   RBC 4.60 4.22 - 5.81 MIL/uL   Hemoglobin 14.5 13.0 - 17.0 g/dL   HCT 40.3 39.0 - 52.0 %   MCV 87.6 80.0 - 100.0 fL   MCH 31.5 26.0 - 34.0 pg   MCHC 36.0 30.0 - 36.0 g/dL   RDW 13.3 11.5 - 15.5 %   Platelets 310 150 - 400 K/uL   nRBC 0.0 0.0 - 0.2 %    Comment: Performed at [facility], [address], [place], [place] [address]  Urine Drug Screen, Qualitative     Status: None   Collection Time: 01/13/19  7:26 PM  Result Value Ref Range   Tricyclic, Ur Screen NONE DETECTED NONE DETECTED   Amphetamines, Ur Screen NONE DETECTED NONE DETECTED   MDMA (Ecstasy)Ur Screen NONE DETECTED NONE DETECTED   Cocaine Metabolite,Ur [place] NONE DETECTED NONE DETECTED   Opiate, Ur Screen NONE DETECTED NONE DETECTED   Phencyclidine (PCP) Ur S NONE DETECTED NONE DETECTED   Cannabinoid 50 Ng, Ur  NONE DETECTED NONE DETECTED   Barbiturates, Ur Screen NONE DETECTED NONE DETECTED   Benzodiazepine, Ur Scrn NONE DETECTED NONE DETECTED   Methadone Scn, Ur NONE DETECTED NONE DETECTED    Comment: (NOTE) Tricyclics + metabolites, urine    Cutoff 1000 ng/mL Amphetamines + metabolites, urine  Cutoff 1000 ng/mL MDMA (Ecstasy), urine              Cutoff 500 ng/mL Cocaine Metabolite, urine          Cutoff 300 ng/mL Opiate + metabolites, urine        Cutoff 300 ng/mL Phencyclidine (PCP), urine         Cutoff 25 ng/mL Cannabinoid, urine                 Cutoff 50  ng/mL Barbiturates + metabolites, urine  Cutoff 200 ng/mL Benzodiazepine, urine              Cutoff 200 ng/mL Methadone, urine                   Cutoff 300 ng/mL The urine drug screen provides only a preliminary, unconfirmed analytical test result and should not be used for non-medical purposes. Clinical consideration and professional judgment should be applied to any positive drug screen result due to possible interfering substances. A more specific alternate chemical method must be used in order to obtain a confirmed analytical result. Gas chromatography / mass spectrometry (GC/MS) is  the preferred confirmat ory method. Performed at [facility], [address] [place], [place] [address]     No current facility-administered medications for this encounter.    Current Outpatient Medications  Medication Sig Dispense Refill  . amLODipine (NORVASC) 5 MG tablet Take 1 tablet (5 mg total) by mouth daily. 30 tablet 1  . aspirin EC 81 MG tablet Take 1 tablet (81 mg total) by mouth daily. 30 tablet 1  . metoprolol tartrate (LOPRESSOR) 25 MG tablet Take 1 tablet (25 mg total) by mouth 2 (two) times daily. 60 tablet 1    Musculoskeletal: Strength & Muscle Tone: decreased Gait & Station: unsteady Patient leans: N/A  Psychiatric Specialty Exam: Physical Exam  Nursing note and vitals reviewed. Constitutional: He is oriented to person, place, and time.  Neck: Normal range of motion. Neck supple.  Cardiovascular: Normal rate and regular rhythm.  Respiratory: Effort normal.  Musculoskeletal:        General: Deformity present.  Neurological: He is alert and oriented to person, place, and time.    Review of Systems  Eyes: Positive for blurred vision.  Musculoskeletal: Positive for falls.  Neurological: Positive for tremors and weakness.  Psychiatric/Behavioral: Positive for depression and hallucinations. The patient is nervous/anxious and has insomnia.   All other systems  reviewed and are negative.   Blood pressure (!) 148/94, pulse (!) 120, temperature 98.1 F (36.7 C), temperature source Oral, resp. rate 20, height 6\' 2"  (1.88 m), weight 81.6 kg, SpO2 99 %.Body mass index is 23.11 kg/m.  General Appearance: Casual  Eye Contact:  Fair  Speech:  Slow  Volume:  Decreased  Mood:  Depressed and Hopeless  Affect:  Depressed and Flat  Thought Process:  Coherent  Orientation:  Full (Time, Place, and Person)  Thought Content:  Logical and Hallucinations: Auditory Visual  Suicidal Thoughts:  No  Homicidal Thoughts:  No  Memory:  Immediate;   Fair Recent;   Fair Remote;   Fair  Judgement:  Impaired  Insight:  Lacking  Psychomotor Activity:  Decreased  Concentration:  Concentration: Fair and Attention Span: Fair  Recall:  [employer] of Knowledge:  Fair  Language:  Fair  Akathisia:  Negative  Handed:  Right  AIMS (if indicated):     Assets:  Desire for Improvement Housing Physical Health Social Support  ADL's:  Intact  Cognition:  WNL  Sleep:   Insomnia     Treatment Plan Summary: Medication management and Plan Patient meets criteria for psychiatric inpatient admission.  Disposition: Recommend psychiatric Inpatient admission when medically cleared. Supportive therapy provided about ongoing stressors.  Caroline Sauger, NP 01/14/2019 2:27 AM

## 2019-01-14 NOTE — ED Provider Notes (Signed)
Patient has been accepted in transfer to [facility].   Earleen Newport, MD 01/14/19 1034

## 2019-01-14 NOTE — BH Assessment (Signed)
Patient has been accepted to [facility].  Patient assigned to [facility] Accepting physician is Dr. Jonelle Sports.  Call report to [phone].  Representative was [employer].   ER Staff is aware of it:  Lattie Haw, ER Secretary  Dr. Jimmye Norman, ER MD  Janett Billow, Patient's Nurse

## 2019-01-14 NOTE — ED Notes (Signed)
Pt given cane to walk to shower. Pt given shower chair to use.

## 2019-01-14 NOTE — ED Notes (Signed)
[facility]  DEPT  CALLED  FOR TRANSPORT 

## 2019-01-14 NOTE — ED Notes (Signed)
Pt given meal tray.

## 2019-01-14 NOTE — ED Notes (Signed)
[facility] ED tech in and out cathed patient. Patient tolerated well.

## 2019-01-14 NOTE — ED Notes (Signed)
Assumed care of patient aox4, as per prior nurse caring for patient, patient took shower willingly, ate 100% of his breakfast. Patient awaiting transport to [facility]. Patient currently sitting on side of bed calm and cooparative aware of plan for transfer. Safety maintaned. Will monitor. Vss.

## 2019-02-12 DIAGNOSIS — G629 Polyneuropathy, unspecified: Secondary | ICD-10-CM | POA: Insufficient documentation

## 2019-03-11 ENCOUNTER — Ambulatory Visit (INDEPENDENT_AMBULATORY_CARE_PROVIDER_SITE_OTHER): Payer: Medicare Other | Admitting: Vascular Surgery

## 2019-03-11 ENCOUNTER — Encounter (INDEPENDENT_AMBULATORY_CARE_PROVIDER_SITE_OTHER): Payer: Self-pay | Admitting: Vascular Surgery

## 2019-03-11 ENCOUNTER — Other Ambulatory Visit: Payer: Self-pay

## 2019-03-11 VITALS — BP 163/90 | HR 91 | Resp 19 | Ht 74.0 in | Wt 196.0 lb

## 2019-03-11 DIAGNOSIS — R600 Localized edema: Secondary | ICD-10-CM | POA: Diagnosis not present

## 2019-03-11 DIAGNOSIS — E785 Hyperlipidemia, unspecified: Secondary | ICD-10-CM

## 2019-03-11 DIAGNOSIS — M7989 Other specified soft tissue disorders: Secondary | ICD-10-CM | POA: Insufficient documentation

## 2019-03-11 DIAGNOSIS — I639 Cerebral infarction, unspecified: Secondary | ICD-10-CM | POA: Diagnosis not present

## 2019-03-11 DIAGNOSIS — I10 Essential (primary) hypertension: Secondary | ICD-10-CM

## 2019-03-11 NOTE — Patient Instructions (Signed)
Edema  Edema is when you have too much fluid in your body or under your skin. Edema may make your legs, feet, and ankles swell up. Swelling is also common in looser tissues, like around your eyes. This is a common condition. It gets more common as you get older. There are many possible causes of edema. Eating too much salt (sodium) and being on your feet or sitting for a long time can cause edema in your legs, feet, and ankles. Hot weather may make edema worse. Edema is usually painless. Your skin may look swollen or shiny. Follow these instructions at home:  Keep the swollen body part raised (elevated) above the level of your heart when you are sitting or lying down.  Do not sit still or stand for a long time.  Do not wear tight clothes. Do not wear garters on your upper legs.  Exercise your legs. This can help the swelling go down.  Wear elastic bandages or support stockings as told by your doctor.  Eat a low-salt (low-sodium) diet to reduce fluid as told by your doctor.  Depending on the cause of your swelling, you may need to limit how much fluid you drink (fluid restriction).  Take over-the-counter and prescription medicines only as told by your doctor. Contact a doctor if:  Treatment is not working.  You have heart, liver, or kidney disease and have symptoms of edema.  You have sudden and unexplained weight gain. Get help right away if:  You have shortness of breath or chest pain.  You cannot breathe when you lie down.  You have pain, redness, or warmth in the swollen areas.  You have heart, liver, or kidney disease and get edema all of a sudden.  You have a fever and your symptoms get worse all of a sudden. Summary  Edema is when you have too much fluid in your body or under your skin.  Edema may make your legs, feet, and ankles swell up. Swelling is also common in looser tissues, like around your eyes.  Raise (elevate) the swollen body part above the level of your  heart when you are sitting or lying down.  Follow your doctor's instructions about diet and how much fluid you can drink (fluid restriction). This information is not intended to replace advice given to you by your health care provider. Make sure you discuss any questions you have with your health care provider. Document Released: 08/16/2007 Document Revised: 03/02/2017 Document Reviewed: 03/17/2016 Elsevier Patient Education  2020 Elsevier Inc.  

## 2019-03-11 NOTE — Assessment & Plan Note (Signed)
Some residual effect and may be contributing to LE swelling.

## 2019-03-11 NOTE — Assessment & Plan Note (Signed)
lipid control important in reducing the progression of atherosclerotic disease. Continue statin therapy  

## 2019-03-11 NOTE — Assessment & Plan Note (Signed)
blood pressure control important in reducing the progression of atherosclerotic disease. On appropriate oral medications.  

## 2019-03-11 NOTE — Assessment & Plan Note (Signed)

## 2019-03-11 NOTE — Progress Notes (Signed)
Patient ID: Tristan Cook, male   DOB: 11/17/1960, 58 y.o.   MRN: [id number]  Chief Complaint  Patient presents with  . New Patient (Initial Visit)    BLE swelling and tingling    HPI Tristan Cook is a 58 y.o. male.  I am asked to see the patient by E. White, NP for evaluation of leg swelling.  The patient says this has been going on for a few months now without a clear cause or inciting event.  He was assessed by cardiologist and had a stress test and was told everything was fine.  He does have a previous history of stroke and his mobility is not as good as it used to be.  He walks with a cane.  He also has had back issues in the past.  Both legs are affected about the same.  Nothing has really made them better.  Being on his feet and at the end of the day the swelling seems to be worse.  No open ulceration or infection.  No fevers or chills.  No previous history of DVT or superficial thrombophlebitis to his knowledge.   Past Medical History:  Diagnosis Date  . Abdominal pain   . Adenomatous colon polyp 2008  . Arthritis   . Back pain   . Bilateral lumbar radiculopathy   . Blurred vision, bilateral   . Chronic back pain   . Constipation   . Depression   . GERD (gastroesophageal reflux disease)   . Gout   . Hemorrhoids   . High cholesterol   . Hypertension   . Neck pain   . Neuropathy   . Neuropathy of both feet   . Numbness    rectal  . Numbness of legs   . Stroke ([place]) 2019  . Substance abuse ([place])    h/o excessive alcohol use; pt says he limits use to one to 2 beers daily he currently    Past Surgical History:  Procedure Laterality Date  . CATARACT EXTRACTION W/PHACO Right 08/16/2018   Procedure: CATARACT EXTRACTION PHACO AND INTRAOCULAR LENS PLACEMENT RIGHT EYE;  Surgeon: Baruch Goldmann, MD;  Location: [facility];  Service: Ophthalmology;  Laterality: Right;  CDE: 4.18  . COLONOSCOPY  10/09/2006   3 mm pedunculated sigmoid colon polyp removed/8-mm sessile hepatic  flexure polyp (tubular villous adenoma) removed/6-mm  descending colon polyp removed/small internal hemorrhoids  . COLONOSCOPY  02/28/2011   [id number], multiple in the rectum/Internal hemorrhoids, MODERATE-CAUSING RECTAL BLEEDING  . COLONOSCOPY N/A 07/05/2016   Procedure: COLONOSCOPY;  Surgeon: [name], MD;  Location: [facility];  Service: Endoscopy;  Laterality: N/A;  11:15am  . 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 Mild erosive gastrtitis most likely due to indomethacin   . FINGER SURGERY Right    4th and 5th digit  . FLEXIBLE SIGMOIDOSCOPY  01/02/2012   SLF: 1. the colonic mucosa appeared normal in the sigmoid colon 2. Large internal hemorrhoids: cause for rectal bleeding/pain . s/p banding X 3   . HAND SURGERY    . REVERSE SHOULDER ARTHROPLASTY Left 05/29/2018   Procedure: REVERSE SHOULDER ARTHROPLASTY;  Surgeon: Hiram Gash, MD;  Location: [place];  Service: Orthopedics;  Laterality: Left;  . SAVORY DILATION N/A 07/21/2014   Procedure: SAVORY DILATION;  Surgeon: [name], MD;  Location: [facility];  Service: Endoscopy;  Laterality: N/A;     Family History  Problem Relation Age of Onset  .  Hypertension Mother   . Hypertension Father   . Stroke Father   . Hypertension Sister   . Hypertension Brother   . Cancer Brother   . Cancer Brother        throat cancer  . Hypertension Brother   . Diabetes Maternal Grandmother   . Colon cancer Neg Hx     Social History   Tobacco Use  . Smoking status: Former Smoker    Packs/day: 1.50    Years: 33.00    Pack years: 49.50    Types: Cigarettes    Quit date: 08/16/2004    Years since quitting: 14.5  . Smokeless tobacco: Current User    Types: Snuff  . Tobacco comment: Dips every once in a while  Substance Use Topics  . Alcohol use: Yes    Alcohol/week: 3.0 standard drinks    Types: 3 Cans of beer per week    Comment: drinks 6 pack of beer on weekends    . Drug use: No     Allergies  Allergen Reactions  . Hydrocodone     hallucinations from hydrocodone Other reaction(s): Hallucination hallucinations from hydrocodone  . Benadryl [Diphenhydramine Hcl (Sleep)] Other (See Comments)    Palpitations    Current Outpatient Medications  Medication Sig Dispense Refill  . amLODipine (NORVASC) 5 MG tablet Take 1 tablet (5 mg total) by mouth daily. 30 tablet 1  . aspirin EC 81 MG tablet Take 1 tablet (81 mg total) by mouth daily. 30 tablet 1  . atorvastatin (LIPITOR) 20 MG tablet Take by mouth.    . furosemide (LASIX) 40 MG tablet Take by mouth.    . gabapentin (NEURONTIN) 600 MG tablet Take by mouth.    . metoprolol tartrate (LOPRESSOR) 25 MG tablet Take 1 tablet (25 mg total) by mouth 2 (two) times daily. 60 tablet 1   No current facility-administered medications for this visit.      REVIEW OF SYSTEMS (Negative unless checked)  Constitutional: [] Weight loss  [] Fever  [] Chills Cardiac: [] Chest pain   [] Chest pressure   [] Palpitations   [] Shortness of breath when laying flat   [] Shortness of breath at rest   [] Shortness of breath with exertion. Vascular:  [x] Pain in legs with walking   [] Pain in legs at rest   [] Pain in legs when laying flat   [] Claudication   [] Pain in feet when walking  [] Pain in feet at rest  [] Pain in feet when laying flat   [] History of DVT   [] Phlebitis   [x] Swelling in legs   [] Varicose veins   [] Non-healing ulcers Pulmonary:   [] Uses home oxygen   [] Productive cough   [] Hemoptysis   [] Wheeze  [] COPD   [] Asthma Neurologic:  [] Dizziness  [] Blackouts   [] Seizures   [x] History of stroke   [] History of TIA  [] Aphasia   [] Temporary blindness   [] Dysphagia   [] Weakness or numbness in arms   [x] Weakness or numbness in legs Musculoskeletal:  [x] Arthritis   [] Joint swelling   [x] Joint pain   [x] Low back pain Hematologic:  [] Easy bruising  [] Easy bleeding   [] Hypercoagulable state   [] Anemic  [] Hepatitis Gastrointestinal:   [] Blood in stool   [] Vomiting blood  [x] Gastroesophageal reflux/heartburn   [] Abdominal pain Genitourinary:  [] Chronic kidney disease   [] Difficult urination  [] Frequent urination  [] Burning with urination   [] Hematuria Skin:  [] Rashes   [] Ulcers   [] Wounds Psychological:  [] History of anxiety   [x]  History of major depression.    Physical Exam  BP (!) 163/90 (BP Location: Right Arm)   Pulse 91   Resp 19   Ht 6\' 2"  (1.88 m)   Wt 196 lb (88.9 kg)   BMI 25.16 kg/m  Gen:  WD/WN, NAD.  Appears older than stated age Head: [place]/AT, No temporalis wasting. Ear/Nose/Throat: Hearing grossly intact, nares w/o erythema or drainage, oropharynx w/o Erythema/Exudate Eyes: Conjunctiva clear, sclera non-icteric  Neck: trachea midline.  No JVD.  Pulmonary:  Good air movement, respirations not labored, no use of accessory muscles  Cardiac: RRR, no JVD Vascular:  Vessel Right Left  Radial Palpable Palpable                          DP 1+ 1+  PT 1+ Trace    Gastrointestinal:. No masses, surgical incisions, or scars. Musculoskeletal: M/S 5/5 throughout.  Extremities without ischemic changes.  No deformity or atrophy. 1+ BLE edema. Walks with a cane Neurologic: Sensation grossly intact in extremities.  Symmetrical.  Speech is fluent. Motor exam as listed above. Psychiatric: Judgment intact, Mood & affect appropriate for pt's clinical situation. Dermatologic: No rashes or ulcers noted.  No cellulitis or open wounds.    Radiology No results found.  Labs Recent Results (from the past 2160 hour(s))  Comprehensive metabolic panel     Status: Abnormal   Collection Time: 01/13/19  7:26 PM  Result Value Ref Range   Sodium 133 (L) 135 - 145 mmol/L   Potassium 3.4 (L) 3.5 - 5.1 mmol/L   Chloride 93 (L) 98 - 111 mmol/L   CO2 22 22 - 32 mmol/L   Glucose, Bld 123 (H) 70 - 99 mg/dL   BUN <5 (L) 6 - 20 mg/dL   Creatinine, Ser 0.93 0.61 - 1.24 mg/dL   Calcium 9.9 8.9 - 10.3 mg/dL   Total Protein 8.7  (H) 6.5 - 8.1 g/dL   Albumin 3.7 3.5 - 5.0 g/dL   AST 49 (H) 15 - 41 U/L   ALT 29 0 - 44 U/L   Alkaline Phosphatase 113 38 - 126 U/L   Total Bilirubin 1.6 (H) 0.3 - 1.2 mg/dL   GFR calc non Af Amer >60 >60 mL/min   GFR calc Af Amer >60 >60 mL/min   Anion gap 18 (H) 5 - 15    Comment: Performed at [facility], [address] [place], [place] [address]  Ethanol     Status: None   Collection Time: 01/13/19  7:26 PM  Result Value Ref Range   Alcohol, Ethyl (B) <10 <10 mg/dL    Comment: (NOTE) Lowest detectable limit for serum alcohol is 10 mg/dL. For medical purposes only. Performed at [facility], [address] [place], [place] [id number]   Salicylate level     Status: None   Collection Time: 01/13/19  7:26 PM  Result Value Ref Range   Salicylate Lvl [id number] 2.8 - 30.0 mg/dL    Comment: Performed at [facility], [address] [place], [place] [address]  Acetaminophen level     Status: Abnormal   Collection Time: 01/13/19  7:26 PM  Result Value Ref Range   Acetaminophen (Tylenol), Serum <10 (L) 10 - 30 ug/mL    Comment: (NOTE) Therapeutic concentrations vary significantly. A range of 10-30 ug/mL  may be an effective concentration for many patients. However, some  are best treated at concentrations outside of this range. Acetaminophen concentrations >150 ug/mL at 4 hours after ingestion  and >  50 ug/mL at 12 hours after ingestion are often associated with  toxic reactions. Performed at [facility], [address] [place], [place] [address]   cbc     Status: None   Collection Time: 01/13/19  7:26 PM  Result Value Ref Range   WBC 9.8 4.0 - 10.5 K/uL   RBC 4.60 4.22 - 5.81 MIL/uL   Hemoglobin 14.5 13.0 - 17.0 g/dL   HCT 40.3 39.0 - 52.0 %   MCV 87.6 80.0 - 100.0 fL   MCH 31.5 26.0 - 34.0 pg   MCHC 36.0 30.0 - 36.0 g/dL   RDW 13.3 11.5 - 15.5 %   Platelets 310 150 - 400 K/uL   nRBC 0.0 0.0 - 0.2 %    Comment: Performed at  [facility], [address], [place], [place] [address]  Urine Drug Screen, Qualitative     Status: None   Collection Time: 01/13/19  7:26 PM  Result Value Ref Range   Tricyclic, Ur Screen NONE DETECTED NONE DETECTED   Amphetamines, Ur Screen NONE DETECTED NONE DETECTED   MDMA (Ecstasy)Ur Screen NONE DETECTED NONE DETECTED   Cocaine Metabolite,Ur [place] NONE DETECTED NONE DETECTED   Opiate, Ur Screen NONE DETECTED NONE DETECTED   Phencyclidine (PCP) Ur S NONE DETECTED NONE DETECTED   Cannabinoid 50 Ng, Ur [place] NONE DETECTED NONE DETECTED   Barbiturates, Ur Screen NONE DETECTED NONE DETECTED   Benzodiazepine, Ur Scrn NONE DETECTED NONE DETECTED   Methadone Scn, Ur NONE DETECTED NONE DETECTED    Comment: (NOTE) Tricyclics + metabolites, urine    Cutoff 1000 ng/mL Amphetamines + metabolites, urine  Cutoff 1000 ng/mL MDMA (Ecstasy), urine              Cutoff 500 ng/mL Cocaine Metabolite, urine          Cutoff 300 ng/mL Opiate + metabolites, urine        Cutoff 300 ng/mL Phencyclidine (PCP), urine         Cutoff 25 ng/mL Cannabinoid, urine                 Cutoff 50 ng/mL Barbiturates + metabolites, urine  Cutoff 200 ng/mL Benzodiazepine, urine              Cutoff 200 ng/mL Methadone, urine                   Cutoff 300 ng/mL The urine drug screen provides only a preliminary, unconfirmed analytical test result and should not be used for non-medical purposes. Clinical consideration and professional judgment should be applied to any positive drug screen result due to possible interfering substances. A more specific alternate chemical method must be used in order to obtain a confirmed analytical result. Gas chromatography / mass spectrometry (GC/MS) is the preferred confirmat ory method. Performed at [facility], [address] [place], [place] [address]   SARS Coronavirus 2 by RT PCR (hospital order, performed in [facility] hospital lab) Nasopharyngeal Nasopharyngeal Swab      Status: None   Collection Time: 01/14/19  3:39 AM   Specimen: Nasopharyngeal Swab  Result Value Ref Range   SARS Coronavirus 2 NEGATIVE NEGATIVE    Comment: (NOTE) If result is NEGATIVE SARS-CoV-2 target nucleic acids are NOT DETECTED. The SARS-CoV-2 RNA is generally detectable in upper and lower  respiratory specimens during the acute phase of infection. The lowest  concentration of SARS-CoV-2 viral copies this assay can detect is 250  copies /  mL. A negative result does not preclude SARS-CoV-2 infection  and should not be used as the sole basis for treatment or other  patient management decisions.  A negative result may occur with  improper specimen collection / handling, submission of specimen other  than nasopharyngeal swab, presence of viral mutation(s) within the  areas targeted by this assay, and inadequate number of viral copies  (<250 copies / mL). A negative result must be combined with clinical  observations, patient history, and epidemiological information. If result is POSITIVE SARS-CoV-2 target nucleic acids are DETECTED. The SARS-CoV-2 RNA is generally detectable in upper and lower  respiratory specimens dur ing the acute phase of infection.  Positive  results are indicative of active infection with SARS-CoV-2.  Clinical  correlation with patient history and other diagnostic information is  necessary to determine patient infection status.  Positive results do  not rule out bacterial infection or co-infection with other viruses. If result is PRESUMPTIVE POSTIVE SARS-CoV-2 nucleic acids MAY BE PRESENT.   A presumptive positive result was obtained on the submitted specimen  and confirmed on repeat testing.  While 2019 novel coronavirus  (SARS-CoV-2) nucleic acids may be present in the submitted sample  additional confirmatory testing may be necessary for epidemiological  and / or clinical management purposes  to differentiate between  SARS-CoV-2 and other  Sarbecovirus currently known to infect humans.  If clinically indicated additional testing with an alternate test  methodology [phone]) is advised. The SARS-CoV-2 RNA is generally  detectable in upper and lower respiratory sp ecimens during the acute  phase of infection. The expected result is Negative. Fact Sheet for Patients:  StrictlyIdeas.no Fact Sheet for Healthcare Providers: BankingDealers.co.za This test is not yet approved or cleared by the [place] FDA and has been authorized for detection and/or diagnosis of SARS-CoV-2 by FDA under an Emergency Use Authorization (EUA).  This EUA will remain in effect (meaning this test can be used) for the duration of the COVID-19 declaration under Section 564(b)(1) of the Act, 21 U.S.C. section 360bbb-3(b)(1), unless the authorization is terminated or revoked sooner. Performed at [facility], [address] [place], [place] [address]     Assessment/Plan:  Essential hypertension blood pressure control important in reducing the progression of atherosclerotic disease. On appropriate oral medications.   Stroke, small vessel (HCC) Some residual effect and may be contributing to LE swelling.  Hyperlipidemia LDL goal <130 lipid control important in reducing the progression of atherosclerotic disease. Continue statin therapy   Swelling of limb I have had a long discussion with the patient regarding swelling and why it  causes symptoms.  Patient will begin wearing graduated compression stockings class 1 (20-30 mmHg) on a daily basis a prescription was given. The patient will  beginning wearing the stockings first thing in the morning and removing them in the evening. The patient is instructed specifically not to sleep in the stockings.   In addition, behavioral modification will be initiated.  This will include frequent elevation, use of over the counter pain medications and  exercise such as walking.  I have reviewed systemic causes for chronic edema such as liver, kidney and cardiac etiologies.  The patient denies problems with these organ systems.    Consideration for a lymph pump will also be made based upon the effectiveness of conservative therapy.  This would help to improve the edema control and prevent sequela such as ulcers and infections   Patient should undergo duplex ultrasound of the venous system  to ensure that DVT or reflux is not present.  The patient will follow-up with me after the ultrasound.        Leotis Pain 03/11/2019, 9:43 AM   This note was created with Dragon medical transcription system.  Any errors from dictation are unintentional.

## 2019-04-02 ENCOUNTER — Ambulatory Visit (INDEPENDENT_AMBULATORY_CARE_PROVIDER_SITE_OTHER): Payer: Medicare Other | Admitting: Nurse Practitioner

## 2019-04-02 ENCOUNTER — Encounter (INDEPENDENT_AMBULATORY_CARE_PROVIDER_SITE_OTHER): Payer: Self-pay | Admitting: Nurse Practitioner

## 2019-04-02 ENCOUNTER — Ambulatory Visit (INDEPENDENT_AMBULATORY_CARE_PROVIDER_SITE_OTHER): Payer: Medicare Other

## 2019-04-02 ENCOUNTER — Other Ambulatory Visit: Payer: Self-pay

## 2019-04-02 VITALS — BP 136/83 | HR 80 | Resp 15 | Wt 192.6 lb

## 2019-04-02 DIAGNOSIS — I890 Lymphedema, not elsewhere classified: Secondary | ICD-10-CM | POA: Diagnosis not present

## 2019-04-02 DIAGNOSIS — M7989 Other specified soft tissue disorders: Secondary | ICD-10-CM | POA: Diagnosis not present

## 2019-04-02 DIAGNOSIS — I10 Essential (primary) hypertension: Secondary | ICD-10-CM | POA: Diagnosis not present

## 2019-04-02 NOTE — Progress Notes (Signed)
SUBJECTIVE:  Patient ID: Tristan Cook, male    DOB: Sep 25, 1960, 59 y.o.   MRN: [id number] Chief Complaint  Patient presents with  . Follow-up    ultrasound follow up    HPI  Tristan Cook is a 59 y.o. male the patient presents today after his initial visit regarding lower extremity edema.  The swelling has been going on for several months without clear inciting event.  Per the patient's cardiologist there were no issues with his recent stress test.  Swelling in the lower extremity seems to make the patient worse at the end of the day.  Since that time the patient has continued to wear medical grade 1 compression socks and they have been effective in reducing his swelling however he still continues to have some significant edema especially near the ankle area.  He continues to swell worsening the end of the day and it does improve some with elevation.  Today the patient underwent noninvasive studies which show no evidence of DVT or superficial venous thrombosis bilaterally.  The patient had reflux in the common femoral vein bilaterally.  The patient also has reflux in the left popliteal vein in addition to the great saphenous vein at the saphenofemoral junction on the right  Past Medical History:  Diagnosis Date  . Abdominal pain   . Adenomatous colon polyp 2008  . Arthritis   . Back pain   . Bilateral lumbar radiculopathy   . Blurred vision, bilateral   . Chronic back pain   . Constipation   . Depression   . GERD (gastroesophageal reflux disease)   . Gout   . Hemorrhoids   . High cholesterol   . Hypertension   . Neck pain   . Neuropathy   . Neuropathy of both feet   . Numbness    rectal  . Numbness of legs   . Stroke ([place]) 2019  . Substance abuse ([place])    h/o excessive alcohol use; pt says he limits use to one to 2 beers daily he currently    Past Surgical History:  Procedure Laterality Date  . CATARACT EXTRACTION W/PHACO Right 08/16/2018   Procedure: CATARACT  EXTRACTION PHACO AND INTRAOCULAR LENS PLACEMENT RIGHT EYE;  Surgeon: Baruch Goldmann, MD;  Location: [facility];  Service: Ophthalmology;  Laterality: Right;  CDE: 4.18  . COLONOSCOPY  10/09/2006   3 mm pedunculated sigmoid colon polyp removed/8-mm sessile hepatic flexure polyp (tubular villous adenoma) removed/6-mm  descending colon polyp removed/small internal hemorrhoids  . COLONOSCOPY  02/28/2011   [id number], multiple in the rectum/Internal hemorrhoids, MODERATE-CAUSING RECTAL BLEEDING  . COLONOSCOPY N/A 07/05/2016   Procedure: COLONOSCOPY;  Surgeon: [name], MD;  [facility];  Service: Endoscopy;  Laterality: N/A;  11:15am  . 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 Mild erosive gastrtitis most likely due to indomethacin   . FINGER SURGERY Right    4th and 5th digit  . FLEXIBLE SIGMOIDOSCOPY  01/02/2012   SLF: 1. the colonic mucosa appeared normal in the sigmoid colon 2. Large internal hemorrhoids: cause for rectal bleeding/pain . s/p banding X 3   . HAND SURGERY    . REVERSE SHOULDER ARTHROPLASTY Left 05/29/2018   Procedure: REVERSE SHOULDER ARTHROPLASTY;  Surgeon: Hiram Gash, MD;  Location: [facility];  Service: Orthopedics;  Laterality: Left;  . SAVORY DILATION N/A 07/21/2014   Procedure: SAVORY DILATION;  Surgeon: [name], MD;  [facility];  Service: Endoscopy;  Laterality: N/A;    Social History   Socioeconomic History  . Marital status: Single    Spouse name: Not on file  . Number of children: 0  . Years of education: 47  . Highest education level: Not on file  Occupational History  . Occupation: unemployed, Retail buyer: UNEMPLOYED  Tobacco Use  . Smoking status: Former Smoker    Packs/day: 1.50    Years: 33.00    Pack years: 49.50    Types: Cigarettes    Quit date: 08/16/2004    Years since quitting: 14.6  . Smokeless tobacco: Current User    Types: Snuff  . Tobacco  comment: Dips every once in a while  Substance and Sexual Activity  . Alcohol use: Yes    Alcohol/week: 3.0 standard drinks    Types: 3 Cans of beer per week    Comment: drinks 6 pack of beer on weekends  . Drug use: No  . Sexual activity: Not on file  Other Topics Concern  . Not on file  Social History Narrative   Lives alone   No caffeine   Right handed   Social Determinants of Health   Financial Resource Strain:   . Difficulty of Paying Living Expenses: Not on file  Food Insecurity:   . Worried About Charity fundraiser in the Last Year: Not on file  . Ran Out of Food in the Last Year: Not on file  Transportation Needs:   . Lack of Transportation (Medical): Not on file  . Lack of Transportation (Non-Medical): Not on file  Physical Activity:   . Days of Exercise per Week: Not on file  . Minutes of Exercise per Session: Not on file  Stress:   . Feeling of Stress : Not on file  Social Connections:   . Frequency of Communication with Friends and Family: Not on file  . Frequency of Social Gatherings with Friends and Family: Not on file  . Attends Religious Services: Not on file  . Active Member of Clubs or Organizations: Not on file  . Attends Archivist Meetings: Not on file  . Marital Status: Not on file  Intimate Partner Violence:   . Fear of Current or Ex-Partner: Not on file  . Emotionally Abused: Not on file  . Physically Abused: Not on file  . Sexually Abused: Not on file    Family History  Problem Relation Age of Onset  . Hypertension Mother   . Hypertension Father   . Stroke Father   . Hypertension Sister   . Hypertension Brother   . Cancer Brother   . Cancer Brother        throat cancer  . Hypertension Brother   . Diabetes Maternal Grandmother   . Colon cancer Neg Hx     Allergies  Allergen Reactions  . Hydrocodone     hallucinations from hydrocodone Other reaction(s): Hallucination hallucinations from hydrocodone  . Benadryl  [Diphenhydramine Hcl (Sleep)] Other (See Comments)    Palpitations  . Hydrocortisone      Review of Systems   Review of Systems: Negative Unless Checked Constitutional: [] Weight loss  [] Fever  [] Chills Cardiac: [] Chest pain   []  Atrial Fibrillation  [] Palpitations   [] Shortness of breath when laying flat   [] Shortness of breath with exertion. [] Shortness of breath at rest Vascular:  [] Pain in legs with walking   [] Pain in legs with standing [] Pain in legs when laying flat   []   Claudication    [] Pain in feet when laying flat    [] History of DVT   [] Phlebitis   [x] Swelling in legs   [] Varicose veins   [] Non-healing ulcers Pulmonary:   [] Uses home oxygen   [] Productive cough   [] Hemoptysis   [] Wheeze  [] COPD   [] Asthma Neurologic:  [] Dizziness   [] Seizures  [] Blackouts [x] History of stroke   [] History of TIA  [] Aphasia   [] Temporary Blindness   [] Weakness or numbness in arm   [] Weakness or numbness in leg Musculoskeletal:   [] Joint swelling   [] Joint pain   [] Low back pain  []  History of Knee Replacement [] Arthritis [] back Surgeries  []  Spinal Stenosis    Hematologic:  [] Easy bruising  [] Easy bleeding   [] Hypercoagulable state   [] Anemic Gastrointestinal:  [] Diarrhea   [] Vomiting  [x] Gastroesophageal reflux/heartburn   [] Difficulty swallowing. [] Abdominal pain Genitourinary:  [] Chronic kidney disease   [] Difficult urination  [] Anuric   [] Blood in urine [] Frequent urination  [] Burning with urination   [] Hematuria Skin:  [] Rashes   [] Ulcers [] Wounds Psychological:  [] History of anxiety   []  History of major depression  []  Memory Difficulties      OBJECTIVE:   Physical Exam  BP 136/83 (BP Location: Right Arm)   Pulse 80   Resp 15   Wt 192 lb 9.6 oz (87.4 kg)   BMI 24.73 kg/m   Gen: WD/WN, NAD Head: [place]/AT, No temporalis wasting.  Ear/Nose/Throat: Hearing grossly intact, nares w/o erythema or drainage Eyes: PER, EOMI, sclera nonicteric.  Neck: Supple, no masses.  No JVD.  Pulmonary:   Good air movement, no use of accessory muscles.  Cardiac: RRR Vascular:  Vessel Right Left  Posterior Tibial Palpable Palpable   Gastrointestinal: soft, non-distended. No guarding/no peritoneal signs.  Musculoskeletal: M/S 5/5 throughout.  No deformity or atrophy.  Neurologic: Pain and light touch intact in extremities.  Symmetrical.  Speech is fluent. Motor exam as listed above. Psychiatric: Judgment intact, Mood & affect appropriate for pt's clinical situation. Dermatologic: mild stasis dermatitis. No Ulcers Noted.  No changes consistent with cellulitis. Lymph : No Cervical lymphadenopathy, dermal thickening bilaterally        ASSESSMENT AND PLAN:  1. Lymphedema Recommend:  No surgery or intervention at this point in time.    I have reviewed my previous discussion with the patient regarding swelling and why it causes symptoms.  Patient will continue wearing graduated compression stockings class 1 (20-30 mmHg) on a daily basis. The patient will  beginning wearing the stockings first thing in the morning and removing them in the evening. The patient is instructed specifically not to sleep in the stockings.    In addition, behavioral modification including several periods of elevation of the lower extremities during the day will be continued.  This was reviewed with the patient during the initial visit.  The patient will also continue routine exercise, especially walking on a daily basis as was discussed during the initial visit.    Despite conservative treatments of at least four weeks including graduated compression therapy class 1 and behavioral modification including exercise and elevation the patient  has not obtained adequate control of the lymphedema.  The patient still has stage 3 lymphedema and therefore, I believe that a lymph pump should be added to improve the control of the patient's lymphedema.  Additionally, a lymph pump is warranted because it will reduce the risk of  cellulitis and ulceration in the future.  Patient should follow-up in six months  2. Essential hypertension Good BP control today, on appropriate medications, no changes today.    Current Outpatient Medications on File Prior to Visit  Medication Sig Dispense Refill  . amLODipine (NORVASC) 5 MG tablet Take 1 tablet (5 mg total) by mouth daily. 30 tablet 1  . aspirin EC 81 MG tablet Take 1 tablet (81 mg total) by mouth daily. 30 tablet 1  . atorvastatin (LIPITOR) 20 MG tablet Take by mouth.    . furosemide (LASIX) 40 MG tablet Take by mouth.    . gabapentin (NEURONTIN) 600 MG tablet Take by mouth.    . metoprolol tartrate (LOPRESSOR) 25 MG tablet Take 1 tablet (25 mg total) by mouth 2 (two) times daily. 60 tablet 1   No current facility-administered medications on file prior to visit.    There are no Patient Instructions on file for this visit. No follow-ups on file.   Kris Hartmann, NP  This note was completed with Sales executive.  Any errors are purely unintentional.

## 2019-05-23 ENCOUNTER — Emergency Department
Admission: EM | Admit: 2019-05-23 | Discharge: 2019-05-24 | Disposition: A | Discharge status: Home / Self Care | Payer: Medicare Other | Attending: Student | Admitting: Student

## 2019-05-23 ENCOUNTER — Other Ambulatory Visit: Payer: Self-pay

## 2019-05-23 DIAGNOSIS — Z046 Encounter for general psychiatric examination, requested by authority: Secondary | ICD-10-CM

## 2019-05-23 DIAGNOSIS — Z79899 Other long term (current) drug therapy: Secondary | ICD-10-CM | POA: Insufficient documentation

## 2019-05-23 DIAGNOSIS — R4182 Altered mental status, unspecified: Secondary | ICD-10-CM | POA: Diagnosis not present

## 2019-05-23 DIAGNOSIS — F1010 Alcohol abuse, uncomplicated: Secondary | ICD-10-CM | POA: Diagnosis present

## 2019-05-23 DIAGNOSIS — F22 Delusional disorders: Secondary | ICD-10-CM | POA: Diagnosis not present

## 2019-05-23 DIAGNOSIS — Z7982 Long term (current) use of aspirin: Secondary | ICD-10-CM | POA: Diagnosis not present

## 2019-05-23 DIAGNOSIS — Z87891 Personal history of nicotine dependence: Secondary | ICD-10-CM | POA: Diagnosis not present

## 2019-05-23 DIAGNOSIS — I10 Essential (primary) hypertension: Secondary | ICD-10-CM | POA: Insufficient documentation

## 2019-05-23 DIAGNOSIS — F339 Major depressive disorder, recurrent, unspecified: Secondary | ICD-10-CM | POA: Diagnosis not present

## 2019-05-23 LAB — CBC
HCT: 40.3 % (ref 39.0–52.0)
Hemoglobin: 14.3 g/dL (ref 13.0–17.0)
MCH: 31 pg (ref 26.0–34.0)
MCHC: 35.5 g/dL (ref 30.0–36.0)
MCV: 87.4 fL (ref 80.0–100.0)
Platelets: 238 10*3/uL (ref 150–400)
RBC: 4.61 MIL/uL (ref 4.22–5.81)
RDW: 12.3 % (ref 11.5–15.5)
WBC: 8.7 10*3/uL (ref 4.0–10.5)
nRBC: 0 % (ref 0.0–0.2)

## 2019-05-23 LAB — COMPREHENSIVE METABOLIC PANEL
ALT: 38 U/L (ref 0–44)
AST: 48 U/L — ABNORMAL HIGH (ref 15–41)
Albumin: 4.1 g/dL (ref 3.5–5.0)
Alkaline Phosphatase: 77 U/L (ref 38–126)
Anion gap: 15 (ref 5–15)
BUN: 11 mg/dL (ref 6–20)
CO2: 25 mmol/L (ref 22–32)
Calcium: 9.6 mg/dL (ref 8.9–10.3)
Chloride: 99 mmol/L (ref 98–111)
Creatinine, Ser: 1.26 mg/dL — ABNORMAL HIGH (ref 0.61–1.24)
GFR calc Af Amer: >60 mL/min (ref >60)
GFR calc non Af Amer: >60 mL/min (ref >60)
Glucose, Bld: 128 mg/dL — ABNORMAL HIGH (ref 70–99)
Potassium: 2.9 mmol/L — ABNORMAL LOW (ref 3.5–5.1)
Sodium: 139 mmol/L (ref 135–145)
Total Bilirubin: 2.2 mg/dL — ABNORMAL HIGH (ref 0.3–1.2)
Total Protein: 8.5 g/dL — ABNORMAL HIGH (ref 6.5–8.1)

## 2019-05-23 LAB — URINE DRUG SCREEN, QUALITATIVE (ARMC ONLY)
Amphetamines, Ur Screen: NOT DETECTED
Barbiturates, Ur Screen: NOT DETECTED
Benzodiazepine, Ur Scrn: NOT DETECTED
Cannabinoid 50 Ng, Ur ~~LOC~~: NOT DETECTED
Cocaine Metabolite,Ur ~~LOC~~: NOT DETECTED
MDMA (Ecstasy)Ur Screen: NOT DETECTED
Methadone Scn, Ur: NOT DETECTED
Opiate, Ur Screen: NOT DETECTED
Phencyclidine (PCP) Ur S: NOT DETECTED
Tricyclic, Ur Screen: NOT DETECTED

## 2019-05-23 LAB — ETHANOL: Alcohol, Ethyl (B): <10 mg/dL (ref <10)

## 2019-05-23 MED ORDER — SODIUM CHLORIDE 0.9 % IV BOLUS
1000.0000 mL | Freq: Once | INTRAVENOUS | Status: AC
  Administered 2019-05-23: 1000 mL via INTRAVENOUS

## 2019-05-23 MED ORDER — TRAMADOL HCL 50 MG PO TABS
50.0000 mg | ORAL_TABLET | Freq: Once | ORAL | Status: AC
  Administered 2019-05-23: 50 mg via ORAL
  Filled 2019-05-23: qty 1

## 2019-05-23 MED ORDER — THIAMINE HCL 100 MG/ML IJ SOLN
100.0000 mg | Freq: Once | INTRAMUSCULAR | Status: AC
  Administered 2019-05-23: 100 mg via INTRAVENOUS
  Filled 2019-05-23: qty 2

## 2019-05-23 MED ORDER — POTASSIUM CHLORIDE CRYS ER 20 MEQ PO TBCR
40.0000 meq | EXTENDED_RELEASE_TABLET | Freq: Once | ORAL | Status: AC
  Administered 2019-05-23: 40 meq via ORAL
  Filled 2019-05-23: qty 2

## 2019-05-23 MED ORDER — FOLIC ACID 1 MG PO TABS
1.0000 mg | ORAL_TABLET | Freq: Every day | ORAL | Status: DC
  Administered 2019-05-23 – 2019-05-24 (×2): 1 mg via ORAL
  Filled 2019-05-23 (×2): qty 1
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
## 2019-05-23 NOTE — ED Notes (Signed)
Spoke with Dr. Joan Mayans. Do not need to start another IV. OK to not give rest of NS bolus. Pt received about 300 CC of fluid. Per Dr. Joan Mayans, encourage PO intake.

## 2019-05-23 NOTE — ED Notes (Signed)
IVC reassess in am 
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
## 2019-05-23 NOTE — ED Notes (Signed)
Pt given dinner tray at this time by [facility], tech.
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
## 2019-05-23 NOTE — ED Notes (Signed)
Pt given meal tray.
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
## 2019-05-23 NOTE — ED Notes (Addendum)
Pt belongings: - shoes - underwear - gym pants - wallet - phone - long sleeve shirt - ball cap - fall alert necklace

## 2019-05-23 NOTE — ED Notes (Signed)
Pt assisted to bathroom at this time. Pt ambulated with one person assist, tolerated well. Pt gave urine for urine sample, requests to take a shower but notified of rules of taking shower in the morning. Pt agrees.

## 2019-05-23 NOTE — ED Notes (Signed)
Pt brought in by [employer].

## 2019-05-23 NOTE — ED Notes (Addendum)
Pt back to bed (attempted to walk past [address] again), reports BM in the bathroom but no flush and no sign of stool  EDP notified of confusion

## 2019-05-23 NOTE — ED Notes (Signed)
Pt ambulatory to toilet for the third time in 30 mins, pt denies anything being wrong, pt holds left arm across abdomen because "it's broken"  Pt unsteady when standing but is ok when walking  Pt has twice stood and attempted to walk towards 19; when asked where going pt replies "I'm just going to go down there" pt reoriented to bathroom location

## 2019-05-23 NOTE — ED Notes (Addendum)
Pt requesting meds to aid sleep - reports benadryl "makes me dizzy" and melatonin causes drowsiness "throughout the day afterwards"  EDP to be made aware  Pt ambulatory to toilet

## 2019-05-23 NOTE — ED Notes (Signed)
RN to check on pt, pt stated that his IV was starting to burn. RN checked IV. IV noted to be infiltrated. IVF stopped. MD aware.

## 2019-05-24 DIAGNOSIS — F22 Delusional disorders: Secondary | ICD-10-CM | POA: Diagnosis present

## 2019-05-24 LAB — AMMONIA: Ammonia: 26 umol/L (ref 9–35)

## 2019-05-24 MED ORDER — LORAZEPAM 1 MG PO TABS
1.0000 mg | ORAL_TABLET | Freq: Once | ORAL | Status: AC
  Administered 2019-05-24: 1 mg via ORAL
  Filled 2019-05-24: qty 1

## 2019-05-24 MED ORDER — LORAZEPAM 1 MG PO TABS: 1.0000 mg | ORAL_TABLET | Freq: Once | ORAL | Status: DC

## 2019-05-24 MED ORDER — QUETIAPINE FUMARATE 50 MG PO TABS: 50.0000 mg | ORAL_TABLET | Freq: Every day | ORAL | 0 refills | Status: DC

## 2019-05-24 MED ORDER — QUETIAPINE FUMARATE 25 MG PO TABS: 50.0000 mg | ORAL_TABLET | Freq: Every day | ORAL | Status: DC

## 2019-05-24 NOTE — ED Notes (Signed)
Pt medication administered as ordered. Pt meal tray provided. Will attempt to draw labs as ordered once finished with meal tray. Pt calm and cooperative at this time.

## 2019-05-24 NOTE — Consult Note (Signed)
[facility] Consult   Reason for Consult:  Delusional thoughts Referring Physician:  EDP Patient Identification: Tristan Cook MRN:  [id number] Principal Diagnosis: Delusional thoughts [facility]) Diagnosis:  Principal Problem:   Delusional thoughts ([place]) Active Problems:   Alcohol abuse   Total Time spent with patient: 30 minutes  Subjective:   Tristan Cook is a 59 y.o. male patient reports that he is feeling much better today.  He states that he had a few days of having difficulty sleeping.  He states that he had fired the gun out of his hotel room to scare people off.  He stated that the hotel contacted the police and the police confiscated his gun.  He reports he had no other guns and police search for some in his room.  He denies any suicidal or homicidal ideations and denies any hallucinations.  Patient reports that after he got here and he received some medications he has been sleeping much better.  He feels much calmer today.  HPI:  Patient is a 59 year old male with no formal psychiatric diagnosis who presents under IVC due to bizarre behavior.  Per information from police department, patient was at nights and motel last night where he fired several shots from his handgun.  When the police arrived patient stated that people were outside the hotel attempting to get him however police found no evidence of this.  They brought patient in under IVC paperwork.  Patient has been seen by this provider via face-to-face and have consulted with Dr. Mallie Darting.  Patient presents much more calmer and alert today.  Patient also reports that he did feel as though someone was trying to get into his room yesterday and he fired the gun to scare them away, however he does report that the gun has been confiscated by the police department and he has no other weapons.  Patient agreed to start Seroquel 50 mg p.o. nightly to assist with sleep and with possible paranoia.  Patient is very compliant,  calm, and cooperative.  Patient has been up and taking a shower for himself as well as eating.  Patient's thought process in conversation much more logical than previously reported.  Patient does not appear to be disoriented when evaluated today.  At this time the patient does not meet inpatient criteria and is psychiatrically cleared.  I have rescinded the patient's IVC.  I have notified Dr. Joan Mayans of the recommendations.  I have provided a prescription and resources for outpatient treatment to the patient prior to discharge  Past Psychiatric History: From chart review patient has several very similar presentations over the course of the last several years.  He is often hallucinating in the context of alcohol withdrawal.  He was recently hospitalized approximately a year ago and discharged shortly after his detox.  He denies any prior suicide attempts.  Risk to Self:   Risk to Others:   Prior Inpatient Therapy:   Prior Outpatient Therapy:    Past Medical History:  Past Medical History:  Diagnosis Date  . Abdominal pain   . Adenomatous colon polyp 2008  . Arthritis   . Back pain   . Bilateral lumbar radiculopathy   . Blurred vision, bilateral   . Chronic back pain   . Constipation   . Depression   . GERD (gastroesophageal reflux disease)   . Gout   . Hemorrhoids   . High cholesterol   . Hypertension   . Neck pain   . Neuropathy   .  Neuropathy of both feet   . Numbness    rectal  . Numbness of legs   . Stroke ([place]) 2019  . Substance abuse ([place])    h/o excessive alcohol use; pt says he limits use to one to 2 beers daily he currently    Past Surgical History:  Procedure Laterality Date  . CATARACT EXTRACTION W/PHACO Right 08/16/2018   Procedure: CATARACT EXTRACTION PHACO AND INTRAOCULAR LENS PLACEMENT RIGHT EYE;  Surgeon: Baruch Goldmann, MD;  Location: [facility];  Service: Ophthalmology;  Laterality: Right;  CDE: 4.18  . COLONOSCOPY  10/09/2006   3 mm pedunculated sigmoid colon polyp  removed/8-mm sessile hepatic flexure polyp (tubular villous adenoma) removed/6-mm  descending colon polyp removed/small internal hemorrhoids  . COLONOSCOPY  02/28/2011   [id number], multiple in the rectum/Internal hemorrhoids, MODERATE-CAUSING RECTAL BLEEDING  . COLONOSCOPY N/A 07/05/2016   Procedure: COLONOSCOPY;  Surgeon: [name], MD;  Location: [facility];  Service: Endoscopy;  Laterality: N/A;  11:15am  . 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 Mild erosive gastrtitis most likely due to indomethacin   . FINGER SURGERY Right    4th and 5th digit  . FLEXIBLE SIGMOIDOSCOPY  01/02/2012   SLF: 1. the colonic mucosa appeared normal in the sigmoid colon 2. Large internal hemorrhoids: cause for rectal bleeding/pain . s/p banding X 3   . HAND SURGERY    . REVERSE SHOULDER ARTHROPLASTY Left 05/29/2018   Procedure: REVERSE SHOULDER ARTHROPLASTY;  Surgeon: Hiram Gash, MD;  Location: [facility];  Service: Orthopedics;  Laterality: Left;  . SAVORY DILATION N/A 07/21/2014   Procedure: SAVORY DILATION;  Surgeon: [name], MD;  Location: [facility];  Service: Endoscopy;  Laterality: N/A;   Family History:  Family History  Problem Relation Age of Onset  . Hypertension Mother   . Hypertension Father   . Stroke Father   . Hypertension Sister   . Hypertension Brother   . Cancer Brother   . Cancer Brother        throat cancer  . Hypertension Brother   . Diabetes Maternal Grandmother   . Colon cancer Neg Hx    Family Psychiatric  History: None reported Social History:  Social History   Substance and Sexual Activity  Alcohol Use Yes  . Alcohol/week: 3.0 standard drinks  . Types: 3 Cans of beer per week   Comment: drinks 6 pack of beer on weekends     Social History   Substance and Sexual Activity  Drug Use No    Social History   Socioeconomic History  . Marital status: Single    Spouse name: Not on file  .  Number of children: 0  . Years of education: 73  . Highest education level: Not on file  Occupational History  . Occupation: unemployed, Retail buyer: UNEMPLOYED  Tobacco Use  . Smoking status: Former Smoker    Packs/day: 1.50    Years: 33.00    Pack years: 49.50    Types: Cigarettes    Quit date: 08/16/2004    Years since quitting: 14.7  . Smokeless tobacco: Current User    Types: Snuff  . Tobacco comment: Dips every once in a while  Substance and Sexual Activity  . Alcohol use: Yes    Alcohol/week: 3.0 standard drinks    Types: 3 Cans of beer per week    Comment: drinks 6 pack of beer on weekends  .  Drug use: No  . Sexual activity: Not on file  Other Topics Concern  . Not on file  Social History Narrative   Lives alone   No caffeine   Right handed   Social Determinants of Health   Financial Resource Strain:   . Difficulty of Paying Living Expenses:   Food Insecurity:   . Worried About Charity fundraiser in the Last Year:   . Arboriculturist in the Last Year:   Transportation Needs:   . Film/video editor (Medical):   [name] Lack of Transportation (Non-Medical):   Physical Activity:   . Days of Exercise per Week:   . Minutes of Exercise per Session:   Stress:   . Feeling of Stress :   Social Connections:   . Frequency of Communication with Friends and Family:   . Frequency of Social Gatherings with Friends and Family:   . Attends Religious Services:   . Active Member of Clubs or Organizations:   . Attends Archivist Meetings:   [name] Marital Status:    Additional Social History:    Allergies:   Allergies  Allergen Reactions  . Hydrocodone     hallucinations from hydrocodone Other reaction(s): Hallucination hallucinations from hydrocodone  . Benadryl [Diphenhydramine Hcl (Sleep)] Other (See Comments)    Palpitations  . Hydrocortisone     Labs:  Results for orders placed or performed during the hospital encounter of 05/23/19 (from  the past 48 hour(s))  Urine Drug Screen, Qualitative     Status: None   Collection Time: 05/23/19 10:19 AM  Result Value Ref Range   Tricyclic, Ur Screen NONE DETECTED NONE DETECTED   Amphetamines, Ur Screen NONE DETECTED NONE DETECTED   MDMA (Ecstasy)Ur Screen NONE DETECTED NONE DETECTED   Cocaine Metabolite,Ur [place] NONE DETECTED NONE DETECTED   Opiate, Ur Screen NONE DETECTED NONE DETECTED   Phencyclidine (PCP) Ur S NONE DETECTED NONE DETECTED   Cannabinoid 50 Ng, Ur [place] NONE DETECTED NONE DETECTED   Barbiturates, Ur Screen NONE DETECTED NONE DETECTED   Benzodiazepine, Ur Scrn NONE DETECTED NONE DETECTED   Methadone Scn, Ur NONE DETECTED NONE DETECTED    Comment: (NOTE) Tricyclics + metabolites, urine    Cutoff 1000 ng/mL Amphetamines + metabolites, urine  Cutoff 1000 ng/mL MDMA (Ecstasy), urine              Cutoff 500 ng/mL Cocaine Metabolite, urine          Cutoff 300 ng/mL Opiate + metabolites, urine        Cutoff 300 ng/mL Phencyclidine (PCP), urine         Cutoff 25 ng/mL Cannabinoid, urine                 Cutoff 50 ng/mL Barbiturates + metabolites, urine  Cutoff 200 ng/mL Benzodiazepine, urine              Cutoff 200 ng/mL Methadone, urine                   Cutoff 300 ng/mL The urine drug screen provides only a preliminary, unconfirmed analytical test result and should not be used for non-medical purposes. Clinical consideration and professional judgment should be applied to any positive drug screen result due to possible interfering substances. A more specific alternate chemical method must be used in order to obtain a confirmed analytical result. Gas chromatography / mass spectrometry (GC/MS) is the preferred confirmat ory method. Performed at [facility], [phone] [place], [place] [address]   Comprehensive metabolic panel     Status: Abnormal   Collection Time: 05/23/19 10:33 AM  Result Value Ref Range   Sodium 139 135 - 145 mmol/L   Potassium 2.9 (L)  3.5 - 5.1 mmol/L   Chloride 99 98 - 111 mmol/L   CO2 25 22 - 32 mmol/L   Glucose, Bld 128 (H) 70 - 99 mg/dL    Comment: Glucose reference range applies only to samples taken after fasting for at least 8 hours.   BUN 11 6 - 20 mg/dL   Creatinine, Ser 1.26 (H) 0.61 - 1.24 mg/dL   Calcium 9.6 8.9 - 10.3 mg/dL   Total Protein 8.5 (H) 6.5 - 8.1 g/dL   Albumin 4.1 3.5 - 5.0 g/dL   AST 48 (H) 15 - 41 U/L   ALT 38 0 - 44 U/L   Alkaline Phosphatase 77 38 - 126 U/L   Total Bilirubin 2.2 (H) 0.3 - 1.2 mg/dL   GFR calc non Af Amer >60 >60 mL/min   GFR calc Af Amer >60 >60 mL/min   Anion gap 15 5 - 15    Comment: Performed at [facility], [address] [place], [place] [address]  Ethanol     Status: None   Collection Time: 05/23/19 10:33 AM  Result Value Ref Range   Alcohol, Ethyl (B) <10 <10 mg/dL    Comment: (NOTE) Lowest detectable limit for serum alcohol is 10 mg/dL. For medical purposes only. Performed at [facility], [address] [place], [place] [address]   cbc     Status: None   Collection Time: 05/23/19 10:33 AM  Result Value Ref Range   WBC 8.7 4.0 - 10.5 K/uL   RBC 4.61 4.22 - 5.81 MIL/uL   Hemoglobin 14.3 13.0 - 17.0 g/dL   HCT 40.3 39.0 - 52.0 %   MCV 87.4 80.0 - 100.0 fL   MCH 31.0 26.0 - 34.0 pg   MCHC 35.5 30.0 - 36.0 g/dL   RDW 12.3 11.5 - 15.5 %   Platelets 238 150 - 400 K/uL   nRBC 0.0 0.0 - 0.2 %    Comment: Performed at [facility], [address] [place], [place] [address]  Ammonia     Status: None   Collection Time: 05/24/19  9:28 AM  Result Value Ref Range   Ammonia 26 9 - 35 umol/L    Comment: Performed at [facility], [address] [place], [place] [address]    Current Facility-Administered Medications  Medication Dose Route Frequency Provider Last Rate Last Admin  . folic acid (FOLVITE) tablet 1 mg  1 mg Oral Daily Lilia Pro., MD   1 mg at 05/24/19 0904  . LORazepam (ATIVAN) tablet 1 mg  1 mg  Oral Once Blake Divine, MD      . QUEtiapine (SEROQUEL) tablet 50 mg  50 mg Oral QHS Rachael Zapanta, Lowry Ram, FNP       Current Outpatient Medications  Medication Sig Dispense Refill  . aspirin EC 81 MG tablet Take 1 tablet (81 mg total) by mouth daily. 30 tablet 1  . atorvastatin (LIPITOR) 20 MG tablet Take 20 mg by mouth daily.     . furosemide (LASIX) 40 MG tablet Take 40 mg by mouth daily.     [name] gabapentin (NEURONTIN) 800 MG tablet Take 800 mg by mouth 3 (three) times daily.     . metoprolol tartrate (LOPRESSOR)  25 MG tablet Take 1 tablet (25 mg total) by mouth 2 (two) times daily. 60 tablet 1    Musculoskeletal: Strength & Muscle Tone: within normal limits Gait & Station: normal Patient leans: N/A  Psychiatric Specialty Exam: Physical Exam  Nursing note and vitals reviewed. Constitutional: He is oriented to person, place, and time. He appears well-developed and well-nourished.  Cardiovascular: Normal rate.  Respiratory: Effort normal.  Musculoskeletal:        General: Normal range of motion.  Neurological: He is alert and oriented to person, place, and time.  Skin: Skin is warm.    Review of Systems  Constitutional: Negative.   HENT: Negative.   Eyes: Negative.   Respiratory: Negative.   Cardiovascular: Negative.   Gastrointestinal: Negative.   Genitourinary: Negative.   Musculoskeletal: Negative.   Skin: Negative.   Neurological: Negative.   Psychiatric/Behavioral: Negative.     Blood pressure 113/72, pulse 92, temperature 98.9 F (37.2 C), temperature source Oral, resp. rate 18, height 6\' 2"  (1.88 m), weight 83.9 kg, SpO2 98 %.Body mass index is 23.75 kg/m.  General Appearance: Casual  Eye Contact:  Good  Speech:  Clear and Coherent and Normal Rate  Volume:  Decreased  Mood:  Euthymic  Affect:  Flat  Thought Process:  Coherent and Descriptions of Associations: Intact  Orientation:  Full (Time, Place, and Person)  Thought Content:  WDL  Suicidal Thoughts:  No   Homicidal Thoughts:  No  Memory:  Immediate;   Fair Recent;   Fair Remote;   Fair  Judgement:  Fair  Insight:  Fair  Psychomotor Activity:  Normal  Concentration:  Concentration: Fair  Recall:  [employer] of Knowledge:  Fair  Language:  Fair  Akathisia:  No  Handed:  Right  AIMS (if indicated):     Assets:  Desire for Improvement Financial Resources/Insurance Housing Resilience  ADL's:  Intact  Cognition:  WNL  Sleep:        Treatment Plan Summary: Start Seroquel 50 mg PO QHS and sent home with prescription  Follow up with outpatient psychiatry  Disposition: No evidence of imminent risk to self or others at present.   Patient does not meet criteria for psychiatric inpatient admission. Supportive therapy provided about ongoing stressors. Discussed crisis plan, support from social network, calling 911, coming to the Emergency Department, and calling Suicide Hotline.  Van Tassell, FNP 05/24/2019 1:41 PM

## 2019-05-24 NOTE — ED Notes (Signed)
Pt being d/c at this time. Pt belongings and blue paper scrubs provided per pt request. Rx for Seroquel obtained from psych NP. Pt to d/c to self care. Pt reports able to pay for cab to transport back to hotel however unable to stay at that particular hotel per triage report due to incident. Pt A&O at this time and ambulatory.

## 2019-05-24 NOTE — ED Notes (Signed)
Charge requested for help to room pt to reduce stimulation, possibly room 19 after that pt is seen by psych  Fluids and snack refused by pt  Pt able to follow finger for cardinal eye movements, appears without tremors, speech is clear

## 2019-05-24 NOTE — ED Notes (Signed)
Pt sleeping with even and unlabored respirations.

## 2019-05-24 NOTE — ED Notes (Signed)
Pt ambulated to restroom with assistance. After using restroom, pt opened door and had removed all clothes and was standing in restroom naked. Pt disoriented at this time. Pt placed back into scrubs and assisted back into hallway bed.

## 2019-05-24 NOTE — ED Notes (Signed)
Pt ambulated to restroom to take a shower per pt request.

## 2019-05-24 NOTE — ED Notes (Signed)
Pt continue to engage with staff regarding "big Maylon Cos coming up the hallway"

## 2019-05-24 NOTE — ED Provider Notes (Signed)
-----------------------------------------   1:20 AM on 05/24/2019 -----------------------------------------  Blood pressure 113/72, pulse 92, temperature 98.9 F (37.2 C), temperature source Oral, resp. rate 18, height 6\' 2"  (1.88 m), weight 83.9 kg, SpO2 98 %.  The patient is calm and cooperative at this time.  There have been no acute events since the last update.  Awaiting disposition plan from Behavioral Medicine team.   Blake Divine, MD 05/24/19 0120

## 2019-05-24 NOTE — ED Notes (Signed)
Ice water provided per pt request

## 2019-05-24 NOTE — ED Notes (Signed)
Pt refusing blood draw and meds, "go call big Maylon Cos and get him here", pt reports that this RN's father "is Ronalee Belts" and "he [my father] set him up"   This RN unable to orient pt to current situation  Dr Charna Archer notified

## 2019-05-24 NOTE — Discharge Instructions (Addendum)
Your kidney function was slightly elevating your potassium was slightly low.  This should be checked rechecked with your primary doctor.  You are cleared by the psychiatric team for discharge home

## 2019-05-24 NOTE — ED Notes (Signed)
Pt is sitting up in bed talking to someone by the name of " Big J and Fritz Pickerel".

## 2019-05-24 NOTE — ED Notes (Signed)
Pt attempting to walk towards lobby, when this RN attempted to redirect pt pushed back and "said I'm not doing that"  Deputy able to get pt back to bed, pt sitting at edge, reports current location as [place] and unable to give today's date or current year  Pt is more steady on feet than before  Dr Charna Archer notified

## 2019-05-24 NOTE — ED Provider Notes (Signed)
Pt cleared by psych for dc.   Vanessa Amagon, MD 05/24/19 [phone]

## 2019-05-24 NOTE — ED Notes (Signed)
Pt offered drink and snack, reports "so what? You can slip a micky in it?"

## 2019-06-03 ENCOUNTER — Other Ambulatory Visit: Payer: Self-pay

## 2019-06-03 ENCOUNTER — Emergency Department: Payer: Medicare Other

## 2019-06-03 ENCOUNTER — Emergency Department
Admission: EM | Admit: 2019-06-03 | Discharge: 2019-06-05 | Disposition: A | Discharge status: Other Acute Inpatient Hospital | Payer: Medicare Other | Attending: Student | Admitting: Student

## 2019-06-03 DIAGNOSIS — Z87891 Personal history of nicotine dependence: Secondary | ICD-10-CM | POA: Insufficient documentation

## 2019-06-03 DIAGNOSIS — G629 Polyneuropathy, unspecified: Secondary | ICD-10-CM | POA: Insufficient documentation

## 2019-06-03 DIAGNOSIS — F29 Unspecified psychosis not due to a substance or known physiological condition: Secondary | ICD-10-CM | POA: Diagnosis not present

## 2019-06-03 DIAGNOSIS — W19XXXA Unspecified fall, initial encounter: Secondary | ICD-10-CM

## 2019-06-03 DIAGNOSIS — F1020 Alcohol dependence, uncomplicated: Secondary | ICD-10-CM | POA: Insufficient documentation

## 2019-06-03 DIAGNOSIS — Z20822 Contact with and (suspected) exposure to covid-19: Secondary | ICD-10-CM | POA: Insufficient documentation

## 2019-06-03 DIAGNOSIS — I10 Essential (primary) hypertension: Secondary | ICD-10-CM | POA: Diagnosis not present

## 2019-06-03 DIAGNOSIS — Z79899 Other long term (current) drug therapy: Secondary | ICD-10-CM | POA: Insufficient documentation

## 2019-06-03 DIAGNOSIS — Z7982 Long term (current) use of aspirin: Secondary | ICD-10-CM | POA: Diagnosis not present

## 2019-06-03 DIAGNOSIS — M25561 Pain in right knee: Secondary | ICD-10-CM | POA: Diagnosis present

## 2019-06-03 DIAGNOSIS — G621 Alcoholic polyneuropathy: Secondary | ICD-10-CM

## 2019-06-03 MED ORDER — OXYCODONE-ACETAMINOPHEN 5-325 MG PO TABS
1.0000 | ORAL_TABLET | Freq: Once | ORAL | Status: AC
  Administered 2019-06-03: 1 via ORAL
  Filled 2019-06-03: qty 1

## 2019-06-03 MED ORDER — MELOXICAM 7.5 MG PO TABS
15.0000 mg | ORAL_TABLET | Freq: Once | ORAL | Status: AC
  Administered 2019-06-03: 15 mg via ORAL
  Filled 2019-06-03: qty 2

## 2019-06-03 MED ORDER — PREDNISONE 20 MG PO TABS
60.0000 mg | ORAL_TABLET | Freq: Once | ORAL | Status: AC
  Administered 2019-06-03: 60 mg via ORAL
  Filled 2019-06-03: qty 3

## 2019-06-03 NOTE — ED Provider Notes (Signed)
[facility] Emergency Department Provider Note  ____________________________________________  Time seen: Approximately 6:54 PM  I have reviewed the triage vital signs and the nursing notes.   HISTORY  Chief Complaint Knee Pain    HPI Tristan Cook is a 59 y.o. male who presents the emergency department complaining of bilateral lower extremity pain, numbness.  Patient states that he fell, 3 days ago.  He was able to push his life alert, had help arrive and help him out of the floor.  Patient states that he had no symptoms at that time.  Roughly 36 hours later, patient had worsening bilateral knee pain, worsening neuropathy.  Patient states that he has burning and tingling sensation down his legs, but no sensation in the bottom of his feet.  He states that he feels that sensation to the bottom of his feet is even less than normal, as well as increased burning sensation bilaterally.  No unilateral weakness.  Patient denies any bowel or bladder dysfunction, saddle anesthesia.  Patient denies any direct trauma to the back or bilateral hips.  No hip pain.  Patient did not hit his head or lose consciousness during this fall.  Patient's only complaints right now is bilateral knee pain and worsening neuropathy.        Past Medical History:  Diagnosis Date  . Abdominal pain   . Adenomatous colon polyp 2008  . Arthritis   . Back pain   . Bilateral lumbar radiculopathy   . Blurred vision, bilateral   . Chronic back pain   . Constipation   . Depression   . GERD (gastroesophageal reflux disease)   . Gout   . Hemorrhoids   . High cholesterol   . Hypertension   . Neck pain   . Neuropathy   . Neuropathy of both feet   . Numbness    rectal  . Numbness of legs   . Stroke ([place]) 2019  . Substance abuse ([place])    h/o excessive alcohol use; pt says he limits use to one to 2 beers daily he currently    Patient Active Problem List   Diagnosis Date Noted  .  Delusional thoughts ([place]) 05/24/2019  . Swelling of limb 03/11/2019  . Peripheral polyneuropathy 02/12/2019  . Alcohol intoxication ([place]) 08/27/2018  . Hallucinations 08/27/2018  . MDD (major depressive disorder), recurrent, severe, with psychosis ([place]) 08/27/2018  . Alcohol use disorder, severe, dependence ([place]) 08/27/2018  . TIA (transient ischemic attack) 05/29/2018  . Syncope 05/29/2018  . Alcohol abuse 05/29/2018  . Fall 05/29/2018  . Dislocation of left shoulder joint 05/29/2018  . Closed fracture of left proximal humerus 05/29/2018  . Abnormal LFTs 05/29/2018  . Hypokalemia 05/29/2018  . Constipation 10/17/2017  . Hoarseness 10/17/2017  . Weakness 04/11/2017  . Weakness of both legs 04/10/2017  . Vitamin B deficiency 01/22/2017  . 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 Stroke, small vessel ([place]) 01/17/2017  . Alcoholic peripheral neuropathy ([place]) 01/17/2017  . Vitamin B12 deficiency neuropathy ([place]) 01/17/2017  . Adjustment disorder with depressed mood 08/15/2016  . History of colonic polyps 06/14/2016  . Elevated blood pressure reading 06/14/2016  . Hemorrhoids 09/20/2015  . Depressive disorder 08/03/2015  . Back pain with radiation 06/15/2015  . Insomnia 06/03/2015  . Hyperlipidemia LDL goal <130 04/25/2015  . 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 Fatigue 03/29/2015  . Allergic rhinitis 02/10/2015  . Esophageal stricture 10/22/2014  . Overweight (BMI 25.0-29.9) 10/06/2013  . GERD (gastroesophageal reflux disease) 04/23/2012  .  Hemorrhoids, internal 11/02/2011  . Essential hypertension 07/25/2007  . Neck pain on left side 07/25/2007    Past Surgical History:  Procedure Laterality Date  . CATARACT EXTRACTION W/PHACO Right 08/16/2018   Procedure: CATARACT EXTRACTION PHACO AND INTRAOCULAR LENS PLACEMENT RIGHT EYE;  Surgeon: Baruch Goldmann, MD;  Location: [facility];  Service: Ophthalmology;  Laterality: Right;  CDE: 4.18  . COLONOSCOPY  10/09/2006   3 mm pedunculated sigmoid colon  polyp removed/8-mm sessile hepatic flexure polyp (tubular villous adenoma) removed/6-mm  descending colon polyp removed/small internal hemorrhoids  . COLONOSCOPY  02/28/2011   [id number], multiple in the rectum/Internal hemorrhoids, MODERATE-CAUSING RECTAL BLEEDING  . COLONOSCOPY N/A 07/05/2016   Procedure: COLONOSCOPY;  Surgeon: [name], MD;  Location: [facility];  Service: Endoscopy;  Laterality: N/A;  11:15am  . 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 Mild erosive gastrtitis most likely due to indomethacin   . FINGER SURGERY Right    4th and 5th digit  . FLEXIBLE SIGMOIDOSCOPY  01/02/2012   SLF: 1. the colonic mucosa appeared normal in the sigmoid colon 2. Large internal hemorrhoids: cause for rectal bleeding/pain . s/p banding X 3   . HAND SURGERY    . REVERSE SHOULDER ARTHROPLASTY Left 05/29/2018   Procedure: REVERSE SHOULDER ARTHROPLASTY;  Surgeon: Hiram Gash, MD;  Location: [place];  Service: Orthopedics;  Laterality: Left;  . SAVORY DILATION N/A 07/21/2014   Procedure: SAVORY DILATION;  Surgeon: [name], MD;  Location: [facility];  Service: Endoscopy;  Laterality: N/A;    Prior to Admission medications   Medication Sig Start Date End Date Taking? Authorizing Provider  aspirin EC 81 MG tablet Take 1 tablet (81 mg total) by mouth daily. 09/04/18   Clapacs, Madie Reno, MD  atorvastatin (LIPITOR) 20 MG tablet Take 20 mg by mouth daily.     [provider]  furosemide (LASIX) 40 MG tablet Take 40 mg by mouth daily.     [provider]  gabapentin (NEURONTIN) 800 MG tablet Take 800 mg by mouth 3 (three) times daily.  02/12/19   [provider]  metoprolol tartrate (LOPRESSOR) 25 MG tablet Take 1 tablet (25 mg total) by mouth 2 (two) times daily. 09/04/18   Clapacs, Madie Reno, MD  QUEtiapine (SEROQUEL) 50 MG tablet Take 1 tablet (50 mg total) by mouth at bedtime. 05/24/19   Money, Lowry Ram, FNP     Allergies Hydrocodone, Benadryl [diphenhydramine hcl (sleep)], and Hydrocortisone  Family History  Problem Relation Age of Onset  . Hypertension Mother   . Hypertension Father   . Stroke Father   . Hypertension Sister   . Hypertension Brother   . Cancer Brother   . Cancer Brother        throat cancer  . Hypertension Brother   . Diabetes Maternal Grandmother   . Colon cancer Neg Hx     Social History Social History   Tobacco Use  . Smoking status: Former Smoker    Packs/day: 1.50    Years: 33.00    Pack years: 49.50    Types: Cigarettes    Quit date: 08/16/2004    Years since quitting: 14.8  . Smokeless tobacco: Current User    Types: Snuff  . Tobacco comment: Dips every once in a while  Substance Use Topics  . Alcohol use: Yes    Alcohol/week: 3.0 standard drinks    Types: 3 Cans of beer per week  Comment: drinks 6 pack of beer on weekends  . Drug use: No     Review of Systems  Constitutional: No fever/chills Eyes: No visual changes. No discharge ENT: No upper respiratory complaints. Cardiovascular: no chest pain. Respiratory: no cough. No SOB. Gastrointestinal: No abdominal pain.  No nausea, no vomiting.   Musculoskeletal: Bilateral knee pain Skin: Negative for rash, abrasions, lacerations, ecchymosis. Neurological: Worsening peripheral neuropathy.  Negative for headaches, focal weakness or numbness. 10-point ROS otherwise negative.  ____________________________________________   PHYSICAL EXAM:  VITAL SIGNS: ED Triage Vitals  Enc Vitals Group     BP 06/03/19 1648 (!) 168/100     Pulse Rate 06/03/19 1648 (!) 118     Resp 06/03/19 1648 20     Temp 06/03/19 1648 98.2 F (36.8 C)     Temp Source 06/03/19 1648 Oral     SpO2 06/03/19 1648 100 %     Weight 06/03/19 1647 190 lb (86.2 kg)     Height 06/03/19 1647 6\' 2"  (1.88 m)     Head Circumference --      Peak Flow --      Pain Score 06/03/19 1651 9     Pain Loc --      Pain Edu? --       Excl. in [place]? --      Constitutional: Alert and oriented. Well appearing and in no acute distress. Eyes: Conjunctivae are normal. PERRL. EOMI. Head: Atraumatic. ENT:      Ears:       Nose: No congestion/rhinnorhea.      Mouth/Throat: Mucous membranes are moist.  Neck: No stridor.    Cardiovascular: Normal rate, regular rhythm. Normal S1 and S2.  Good peripheral circulation. Respiratory: Normal respiratory effort without tachypnea or retractions. Lungs CTAB. Good air entry to the bases with no decreased or absent breath sounds. Musculoskeletal: Full range of motion to all extremities. No gross deformities appreciated.  No visual deformity to the lumbar spine, bilateral hips.  No tenderness to the palpation in these regions.  No visible signs of trauma to the lower extremities.  Patient does have some mild bilateral anterior knee pain.  No point specific tenderness.  No palpable abnormality about the knees.  Dorsalis pedis pulse intact bilaterally.  Patient does have significantly decreased sensation bilaterally, patient does have reported significant neuropathy with sensation loss.  Roughly the same sensation bilaterally.  Patient does have pressure sensation present with palpation in all dermatomal distributions. Neurologic:  Normal speech and language. No gross focal neurologic deficits are appreciated.  Skin:  Skin is warm, dry and intact. No rash noted. Psychiatric: Mood and affect are normal. Speech and behavior are normal. Patient exhibits appropriate insight and judgement.   ____________________________________________   LABS (all labs ordered are listed, but only abnormal results are displayed)  Labs Reviewed - No data to display ____________________________________________  EKG   ____________________________________________  RADIOLOGY I personally viewed and evaluated these images as part of my medical decision making, as well as reviewing the written report by the  radiologist.  DG Lumbar Spine 2-3 Views  Result Date: 06/03/2019 CLINICAL DATA:  59 year old male with fall and back pain. EXAM: LUMBAR SPINE - 2-3 VIEW COMPARISON:  Lumbar spine radiograph dated 06/15/2015. FINDINGS: Five lumbar type vertebra. There is no acute fracture or subluxation of the lumbar spine. Mild degenerative changes and bone spurring. Mild facet arthropathy. The soft tissues appear unremarkable. IMPRESSION: No acute/traumatic lumbar spine pathology. Electronically Signed   By: Milas Hock  Radparvar M.D.   On: 06/03/2019 19:53   DG Pelvis 1-2 Views  Result Date: 06/03/2019 CLINICAL DATA:  Initial evaluation for acute trauma, fall. EXAM: PELVIS - 1-2 VIEW COMPARISON:  None. FINDINGS: No acute fracture dislocation. Femoral heads in normal alignment within the acetabula. Femoral head height maintained. Bony pelvis intact. No pubic diastasis. SI joints approximated. Mild-to-moderate osteoarthritic changes noted about the hips bilaterally. Degenerative spondylosis noted within the lower lumbar spine. No acute soft tissue abnormality. Scattered atherosclerotic calcifications noted. IMPRESSION: No acute osseous abnormality about the pelvis. Electronically Signed   By: Jeannine Boga M.D.   On: 06/03/2019 19:59   DG Knee Complete 4 Views Left  Result Date: 06/03/2019 CLINICAL DATA:  Pain following fall EXAM: LEFT KNEE - COMPLETE 4+ VIEW COMPARISON:  Left knee radiographs December 29, 2016 FINDINGS: Frontal, lateral, and bilateral oblique views were obtained. No fracture or dislocation. No joint effusion. There is mild narrowing in all compartments. There is spurring in all compartments. No erosive change. Foci of arterial vascular calcification noted posteriorly. IMPRESSION: Osteoarthritic change involving all compartments with relatively mild progression since previous study. No fracture, dislocation, or effusion. Vascular calcification noted. Electronically Signed   By: Lowella Grip III  M.D.   On: 06/03/2019 19:56   DG Knee Complete 4 Views Right  Result Date: 06/03/2019 CLINICAL DATA:  Pain following fall EXAM: RIGHT KNEE - COMPLETE 4+ VIEW COMPARISON:  December 29, 2016 FINDINGS: Frontal, lateral, and bilateral oblique views were obtained. There is evidence of an old healed fracture of the proximal fibula with remodeling. No acute fracture or dislocation. No appreciable joint effusion. There is narrowing medially and in the patellofemoral joint. There are foci of arterial vascular calcification posteriorly. IMPRESSION: Prior fracture of the proximal fibula with remodeling. No acute fracture or dislocation. No joint effusion. Osteoarthritic change, primarily medially and in the patellofemoral joint. Arterial vascular calcification noted posteriorly. Electronically Signed   By: Lowella Grip III M.D.   On: 06/03/2019 19:55    ____________________________________________    PROCEDURES  Procedure(s) performed:    Procedures    Medications  oxyCODONE-acetaminophen (PERCOCET/ROXICET) 5-325 MG per tablet 1 tablet (has no administration in time range)  meloxicam (MOBIC) tablet 15 mg (has no administration in time range)  predniSONE (DELTASONE) tablet 60 mg (has no administration in time range)     ____________________________________________   INITIAL IMPRESSION / ASSESSMENT AND PLAN / ED COURSE  Pertinent labs & imaging results that were available during my care of the patient were reviewed by me and considered in my medical decision making (see chart for details).  Review of the [place] CSRS was performed in accordance of the [place] prior to dispensing any controlled drugs.           Patient's diagnosis is consistent with fall, worsening peripheral neuropathy. Patient sustained a mechanical fall 3 days ago. Patient then began to experience worsening bilateral knee pain, with worsening neuropathy. Patient was concerned as he has fractured the right knee before.  Overall it appears that exam is at patient's baseline. Reviewed previous notes from neurology were reviewed as well. No gross signs of trauma. Imaging was reassuring. While in the emergency department, patient was unable to ambulate safely without help given his pain and stating that "I hurt too much to walk." At this time, I discussed the patient's findings on physical exam and imaging. Patient states that he is unsure how he will manage as he is unable to obtain adequate amounts of food, medication  due to financial and social reasons. He states that when he has money he can take a taxi but has no family members and no other support. At this point, patient is asking for help in obtaining resources to ensure that he has appropriate amounts of food and can obtain his medication. At this time patient will be boarded with social work and physical therapy consult pending.. I recommend the patient be on anti-inflammatories, steroid, short course of pain medication for his symptoms on discharge.     ____________________________________________  FINAL CLINICAL IMPRESSION(S) / ED DIAGNOSES  Final diagnoses:  Fall, initial encounter  Alcoholic peripheral neuropathy ([place])      NEW MEDICATIONS STARTED DURING THIS VISIT:  ED Discharge Orders    None          This chart was dictated using voice recognition software/Dragon. Despite best efforts to proofread, errors can occur which can change the meaning. Any change was purely unintentional.    Darletta Moll, PA-C 06/03/19 [id number]    Lilia Pro., MD 06/04/19 [phone]

## 2019-06-03 NOTE — ED Triage Notes (Signed)
Pt comes via [facility] from home with c/o bilateral knee pain. Pt denies any injuries. Pt states he has arthritis and neuropathy.

## 2019-06-04 DIAGNOSIS — G629 Polyneuropathy, unspecified: Secondary | ICD-10-CM | POA: Diagnosis not present

## 2019-06-04 LAB — CBC WITH DIFFERENTIAL/PLATELET
Abs Immature Granulocytes: 0.04 10*3/uL (ref 0.00–0.07)
Basophils Absolute: 0 10*3/uL (ref 0.0–0.1)
Basophils Relative: 0 %
Eosinophils Absolute: 0 10*3/uL (ref 0.0–0.5)
Eosinophils Relative: 0 %
HCT: 39.9 % (ref 39.0–52.0)
Hemoglobin: 14.4 g/dL (ref 13.0–17.0)
Immature Granulocytes: 1 %
Lymphocytes Relative: 9 %
Lymphs Abs: 0.6 10*3/uL — ABNORMAL LOW (ref 0.7–4.0)
MCH: 31.4 pg (ref 26.0–34.0)
MCHC: 36.1 g/dL — ABNORMAL HIGH (ref 30.0–36.0)
MCV: 87.1 fL (ref 80.0–100.0)
Monocytes Absolute: 0.2 10*3/uL (ref 0.1–1.0)
Monocytes Relative: 3 %
Neutro Abs: 5.9 10*3/uL (ref 1.7–7.7)
Neutrophils Relative %: 87 %
Platelets: 286 10*3/uL (ref 150–400)
RBC: 4.58 MIL/uL (ref 4.22–5.81)
RDW: 13.6 % (ref 11.5–15.5)
WBC: 6.7 10*3/uL (ref 4.0–10.5)
nRBC: 0 % (ref 0.0–0.2)

## 2019-06-04 LAB — COMPREHENSIVE METABOLIC PANEL
ALT: 35 U/L (ref 0–44)
AST: 60 U/L — ABNORMAL HIGH (ref 15–41)
Albumin: 3.8 g/dL (ref 3.5–5.0)
Alkaline Phosphatase: 102 U/L (ref 38–126)
Anion gap: 17 — ABNORMAL HIGH (ref 5–15)
BUN: 8 mg/dL (ref 6–20)
CO2: 21 mmol/L — ABNORMAL LOW (ref 22–32)
Calcium: 9.5 mg/dL (ref 8.9–10.3)
Chloride: 97 mmol/L — ABNORMAL LOW (ref 98–111)
Creatinine, Ser: 1.05 mg/dL (ref 0.61–1.24)
GFR calc Af Amer: >60 mL/min (ref >60)
GFR calc non Af Amer: >60 mL/min (ref >60)
Glucose, Bld: 133 mg/dL — ABNORMAL HIGH (ref 70–99)
Potassium: 3.8 mmol/L (ref 3.5–5.1)
Sodium: 135 mmol/L (ref 135–145)
Total Bilirubin: 2 mg/dL — ABNORMAL HIGH (ref 0.3–1.2)
Total Protein: 8.4 g/dL — ABNORMAL HIGH (ref 6.5–8.1)

## 2019-06-04 LAB — ETHANOL: Alcohol, Ethyl (B): <10 mg/dL (ref <10)

## 2019-06-04 LAB — RESPIRATORY PANEL BY RT PCR (FLU A&B, COVID)
Influenza A by PCR: NEGATIVE
Influenza B by PCR: NEGATIVE
SARS Coronavirus 2 by RT PCR: NEGATIVE

## 2019-06-04 MED ORDER — METOPROLOL TARTRATE 25 MG PO TABS
25.0000 mg | ORAL_TABLET | Freq: Two times a day (BID) | ORAL | Status: DC
  Administered 2019-06-04 – 2019-06-05 (×3): 25 mg via ORAL
  Filled 2019-06-04 (×3): qty 1

## 2019-06-04 MED ORDER — HALOPERIDOL LACTATE 5 MG/ML IJ SOLN
INTRAMUSCULAR | Status: AC
  Filled 2019-06-04: qty 2

## 2019-06-04 MED ORDER — LORAZEPAM 1 MG PO TABS: 1.0000 mg | ORAL_TABLET | Freq: Every day | ORAL | Status: DC

## 2019-06-04 MED ORDER — QUETIAPINE FUMARATE 25 MG PO TABS
50.0000 mg | ORAL_TABLET | Freq: Every day | ORAL | Status: DC
  Administered 2019-06-04: 50 mg via ORAL
  Filled 2019-06-04: qty 2

## 2019-06-04 MED ORDER — THIAMINE HCL 100 MG PO TABS
100.0000 mg | ORAL_TABLET | Freq: Every day | ORAL | Status: DC
  Administered 2019-06-05: 100 mg via ORAL
  Filled 2019-06-04: qty 1

## 2019-06-04 MED ORDER — LORAZEPAM 1 MG PO TABS: 1.0000 mg | ORAL_TABLET | Freq: Two times a day (BID) | ORAL | Status: DC

## 2019-06-04 MED ORDER — HALOPERIDOL LACTATE 5 MG/ML IJ SOLN: 10.0000 mg | Freq: Once | INTRAMUSCULAR | Status: DC

## 2019-06-04 MED ORDER — LORAZEPAM 2 MG/ML IJ SOLN
INTRAMUSCULAR | Status: AC
  Filled 2019-06-04: qty 1

## 2019-06-04 MED ORDER — HYDROXYZINE HCL 25 MG PO TABS: 25.0000 mg | ORAL_TABLET | Freq: Four times a day (QID) | ORAL | Status: DC | PRN

## 2019-06-04 MED ORDER — GABAPENTIN 400 MG PO CAPS
800.0000 mg | ORAL_CAPSULE | Freq: Three times a day (TID) | ORAL | Status: DC
  Administered 2019-06-04 – 2019-06-05 (×4): 800 mg via ORAL
  Filled 2019-06-04: qty 8
  Filled 2019-06-04 (×3): qty 2
  Filled 2019-06-04: qty 8
  Filled 2019-06-04 (×2): qty 2
  Filled 2019-06-04: qty 8
  Filled 2019-06-04: qty 2

## 2019-06-04 MED ORDER — ATORVASTATIN CALCIUM 20 MG PO TABS
20.0000 mg | ORAL_TABLET | Freq: Every day | ORAL | Status: DC
  Administered 2019-06-04 – 2019-06-05 (×2): 20 mg via ORAL
  Filled 2019-06-04 (×2): qty 1

## 2019-06-04 MED ORDER — LOPERAMIDE HCL 2 MG PO CAPS
2.0000 mg | ORAL_CAPSULE | ORAL | Status: DC | PRN
  Filled 2019-06-04: qty 2

## 2019-06-04 MED ORDER — ASPIRIN EC 81 MG PO TBEC
81.0000 mg | DELAYED_RELEASE_TABLET | Freq: Every day | ORAL | Status: DC
  Administered 2019-06-04 – 2019-06-05 (×2): 81 mg via ORAL
  Filled 2019-06-04 (×2): qty 1

## 2019-06-04 MED ORDER — ADULT MULTIVITAMIN W/MINERALS CH
1.0000 | ORAL_TABLET | Freq: Every day | ORAL | Status: DC
  Administered 2019-06-05: 1 via ORAL
  Filled 2019-06-04: qty 1

## 2019-06-04 MED ORDER — LORAZEPAM 2 MG/ML IJ SOLN: 2.0000 mg | Freq: Once | INTRAMUSCULAR | Status: DC

## 2019-06-04 MED ORDER — ONDANSETRON 4 MG PO TBDP
4.0000 mg | ORAL_TABLET | Freq: Four times a day (QID) | ORAL | Status: DC | PRN
  Filled 2019-06-04: qty 1

## 2019-06-04 MED ORDER — LORAZEPAM 1 MG PO TABS
1.0000 mg | ORAL_TABLET | Freq: Four times a day (QID) | ORAL | Status: AC
  Administered 2019-06-04 – 2019-06-05 (×3): 1 mg via ORAL
  Filled 2019-06-04 (×3): qty 1

## 2019-06-04 MED ORDER — LORAZEPAM 1 MG PO TABS: 1.0000 mg | ORAL_TABLET | Freq: Four times a day (QID) | ORAL | Status: DC | PRN

## 2019-06-04 MED ORDER — THIAMINE HCL 100 MG/ML IJ SOLN: 100.0000 mg | Freq: Once | INTRAMUSCULAR | Status: DC

## 2019-06-04 MED ORDER — FUROSEMIDE 40 MG PO TABS
40.0000 mg | ORAL_TABLET | Freq: Every day | ORAL | Status: DC
  Administered 2019-06-05: 40 mg via ORAL
  Filled 2019-06-04 (×2): qty 1

## 2019-06-04 MED ORDER — CHLORDIAZEPOXIDE HCL 25 MG PO CAPS
25.0000 mg | ORAL_CAPSULE | Freq: Once | ORAL | Status: DC
  Filled 2019-06-04: qty 1

## 2019-06-04 MED ORDER — LORAZEPAM 1 MG PO TABS: 1.0000 mg | ORAL_TABLET | Freq: Three times a day (TID) | ORAL | Status: DC

## 2019-06-04 NOTE — ED Notes (Signed)
Report received from [place] observed sitting on the side of bed in 19 hallway    Pt visualized with NAD   per report he is awaiting social work to consult with him   He has had multiple falls recently and reports that he cannot take care of himself anymoreNo verbalized needs or concerns at this time  Continue to monitor

## 2019-06-04 NOTE — ED Notes (Signed)
Dinner tray given with soda to patient.
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
## 2019-06-04 NOTE — Consult Note (Signed)
  Patient was interviewed this morning and continues to have delusional beliefs as well as shaky overall appearance.  Vital signs revealed increased blood pressure as well as increased heart rate of 128.  This concern was brought up to the EDP as it was thought that patient may be in delirium tremens.  Patient was placed on CIWA protocol.  Psychiatry will continue to evaluate and observe.

## 2019-06-04 NOTE — ED Notes (Addendum)
Pt given supplies to shower and is able to shower independently. Pt belongings consist of two black gloves, a brace, a pair of tennis shoes, a [place] hat, a sweatshirt, a blue cane, black boxers and pants. Pt has two bags of belongings and a cane.

## 2019-06-04 NOTE — ED Notes (Signed)
TTS has consulted with him via telecomputer
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
## 2019-06-04 NOTE — ED Notes (Signed)
Pt. Alert and oriented, warm and dry, in no distress. Pt. Denies SI, HI, and AVH. Pt. Encouraged to let nursing staff know of any concerns or needs. 

## 2019-06-04 NOTE — Evaluation (Signed)
Physical Therapy Evaluation Patient Details Name: Tristan Cook MRN: [id number] DOB: 17-Dec-1960 Today's Date: 06/04/2019   History of Present Illness  presented to ER secondary to worsening LE pain (bilat knees, neuropathic pain) since fall 3-days prior; all imaging (L-spine, bilat knees and pelvis) negative for acute bony injury.  Clinical Impression  Upon evaluation, patient alert and oriented to basic information; follows simple commands, but generally agitated/impulsive throughout session.  Redirectable, but somewhat labile in mood/response.  Baseline L shoulder ROM deficits noted with assessment (previous ortho injury); otherwise, gross strength and ROM WFL throughout all extremities.  Mild tremulousness noted (UE > LE) and mod foot drop with gait efforts appreciated.  Currently requiring cga/min assist for sit/stand, cga for gait (33') with RW.  Demonstrates reciprocal stepping pattern, choppy steps with moderate steppage gait (bilat foot drop), mod reliance on RW (though tends to pick up with turns); poor balance, generally tremulous.  Do recommend continued use of RW and +1 sup for optimal safety. Would benefit from skilled PT to address above deficits and promote optimal return to PLOF.; Recommend transition to HHPT upon discharge from acute hospitalization.     Follow Up Recommendations Home health PT    Equipment Recommendations  Rolling walker with 5" wheels    Recommendations for Other Services       Precautions / Restrictions Precautions Precautions: Fall Restrictions Weight Bearing Restrictions: No      Mobility  Bed Mobility               General bed mobility comments: seated edge of bed upon arrival to session  Transfers Overall transfer level: Needs assistance Equipment used: Rolling walker (2 wheeled) Transfers: Sit to/from Stand Sit to Stand: Min guard;Min assist         General transfer comment: does require UE support due to lower surface  height; generally resistant to assist from therapist  Ambulation/Gait Ambulation/Gait assistance: Min guard Gait Distance (Feet): 50 Feet Assistive device: Rolling walker (2 wheeled)       General Gait Details: reciprocal stepping pattern, choppy steps with moderate steppage gait (bilat foot drop), mod reliance on RW (though tends to pick up with turns); poor balance, generally tremulous.  Do recommend continued use of RW and +1 sup for optimal safety.  Stairs            Wheelchair Mobility    Modified Rankin (Stroke Patients Only)       Balance Overall balance assessment: Needs assistance Sitting-balance support: No upper extremity supported;Feet supported Sitting balance-Leahy Scale: Normal     Standing balance support: Bilateral upper extremity supported Standing balance-Leahy Scale: Fair                 High Level Balance Comments: limited balance reactions evident, but patient with limited participation with more indepth eval components (insistent on eating once meal arrived)             Pertinent Vitals/Pain Pain Assessment: Faces Faces Pain Scale: Hurts a little bit Pain Location: L shoulder Pain Descriptors / Indicators: Grimacing;Guarding Pain Intervention(s): Limited activity within patient's tolerance;Monitored during session;Repositioned    Home Living   Living Arrangements: Alone               Additional Comments: Currently living in hotel ([place]), first floor room; unsure if he is able to return at discharge    Prior Function Level of Independence: Independent with assistive device(s)         Comments: Mod indep  with SPC for ADLs, household mobilization; no driving, relies on taxi for community transportation.  Does endorse previous fall history, but limited detail provided.     Hand Dominance   Dominant Hand: Right    Extremity/Trunk Assessment   Upper Extremity Assessment Upper Extremity Assessment: (L UE with  baseline deficits to shoulder ROM, all planes (previous ortho injury), elbow, wrist and hand WFL; R LE grossly WFL)    Lower Extremity Assessment Lower Extremity Assessment: (grossly at least 4-/5 throughout, no focal weakness; does endorse baseline neuropathy knees distally, but denies full sensory deficit in feet.  Does demo mild foot drop bilat with gait efforts; limited balance reactions, proprioception evient)       Communication   Communication: No difficulties  Cognition Arousal/Alertness: Awake/alert Behavior During Therapy: Impulsive                                   General Comments: oriented to self, location; follows simple commands; generally agitated and impulsive, but redirectable      General Comments      Exercises     Assessment/Plan    PT Assessment Patient needs continued PT services  PT Problem List Decreased strength;Decreased range of motion;Decreased activity tolerance;Decreased balance;Decreased mobility;Decreased coordination;Decreased cognition;Decreased knowledge of use of DME;Decreased safety awareness;Decreased knowledge of precautions       PT Treatment Interventions DME instruction;Gait training;Stair training;Therapeutic activities;Functional mobility training;Therapeutic exercise;Balance training;Neuromuscular re-education;Cognitive remediation    PT Goals (Current goals can be found in the Care Plan section)  Acute Rehab PT Goals Patient Stated Goal: did not state specific goal; did express interest in 4WRW for mobility PT Goal Formulation: With patient Time For Goal Achievement: 06/18/19 Potential to Achieve Goals: Fair    Frequency Min 2X/week   Barriers to discharge Decreased caregiver support      Co-evaluation               AM-PAC PT "6 Clicks" Mobility  Outcome Measure Help needed turning from your back to your side while in a flat bed without using bedrails?: None Help needed moving from lying on your  back to sitting on the side of a flat bed without using bedrails?: None Help needed moving to and from a bed to a chair (including a wheelchair)?: None Help needed standing up from a chair using your arms (e.g., wheelchair or bedside chair)?: A Little Help needed to walk in hospital room?: A Little Help needed climbing 3-5 steps with a railing? : A Little 6 Click Score: 21    End of Session Equipment Utilized During Treatment: Gait belt Activity Tolerance: Patient tolerated treatment well Patient left: in bed(CNA, security supervising in hallway)   PT Visit Diagnosis: Muscle weakness (generalized) (M62.81);Difficulty in walking, not elsewhere classified (R26.2)    Time: [id number] PT Time Calculation (min) (ACUTE ONLY): 16 min   Charges:   PT Evaluation $PT Eval Moderate Complexity: 1 Mod         Isais Klipfel H. Owens Shark, PT, DPT, NCS 06/04/19, 10:03 AM [phone]
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
## 2019-06-04 NOTE — ED Notes (Signed)
While I was talking with the MD - he stood and began ambulating down the hallway towards the exit  [facility] provided for him - while he was walking and getting into the [facility] he repeatedly stated  "mike - Ronalee Belts come get me - they are going to kill me Ronalee Belts"  Pt aggressively stomping his feet onto the floor so the [facility] may not move  Swinging his arms towards me  [employer] Hardin Negus came to talk with him also  As we came to the A side nurses station the pt put his feet down again and grabbed the glass partition  He talked with the officer about another officer named Ronalee Belts that is "helping" him   Pt reassured  He was moved to room 23 to assist with his paranoia  MD to IVC

## 2019-06-04 NOTE — ED Notes (Signed)
MD to his bedside  Update provided

## 2019-06-04 NOTE — ED Notes (Signed)
IVC/Consult ordered/ Brief note entered/Moved to BHU-3

## 2019-06-04 NOTE — ED Notes (Signed)

## 2019-06-04 NOTE — ED Provider Notes (Signed)
-----------------------------------------   6:09 AM on 06/04/2019 -----------------------------------------   Blood pressure (!) 168/100, pulse (!) 118, temperature 98.2 F (36.8 C), temperature source Oral, resp. rate 20, height 6\' 2"  (1.88 m), weight 86.2 kg, SpO2 100 %.  The patient is calm and cooperative at this time.  There have been no acute events since the last update.  Awaiting disposition plan from Social Work team(s).   Paulette Blanch, MD 06/04/19 [phone]

## 2019-06-04 NOTE — ED Notes (Signed)
When pt was done with shower, pt ambulated back to rm without walker. This tech removed walker from bathroom and took it to pt rm. Pt stated "no, leave that in the bathroom." This tech explained to pt that I could not leave the walker in there d/t other patients use the same bathroom.

## 2019-06-04 NOTE — ED Notes (Signed)
Patient assigned to appropriate care area   Introduced self to pt  Patient oriented to unit/care area: Informed that, for their safety, care areas are designed for safety and visiting and phone hours explained to patient. Patient verbalizes understanding, and verbal contract for safety obtained  Environment secured  

## 2019-06-05 ENCOUNTER — Other Ambulatory Visit: Payer: Self-pay

## 2019-06-05 ENCOUNTER — Inpatient Hospital Stay (HOSPITAL_COMMUNITY)
Admission: AD | Admit: 2019-06-05 | Discharge: 2019-06-09 | DRG: 885 | Disposition: A | Discharge status: Home / Self Care | Payer: Medicare Other | Attending: Psychiatry | Admitting: Psychiatry

## 2019-06-05 ENCOUNTER — Encounter (HOSPITAL_COMMUNITY): Payer: Self-pay | Admitting: Psychiatry

## 2019-06-05 DIAGNOSIS — K219 Gastro-esophageal reflux disease without esophagitis: Secondary | ICD-10-CM | POA: Diagnosis present

## 2019-06-05 DIAGNOSIS — M109 Gout, unspecified: Secondary | ICD-10-CM | POA: Diagnosis present

## 2019-06-05 DIAGNOSIS — G621 Alcoholic polyneuropathy: Secondary | ICD-10-CM | POA: Diagnosis present

## 2019-06-05 DIAGNOSIS — Z8673 Personal history of transient ischemic attack (TIA), and cerebral infarction without residual deficits: Secondary | ICD-10-CM

## 2019-06-05 DIAGNOSIS — G629 Polyneuropathy, unspecified: Secondary | ICD-10-CM | POA: Diagnosis not present

## 2019-06-05 DIAGNOSIS — G8929 Other chronic pain: Secondary | ICD-10-CM | POA: Diagnosis present

## 2019-06-05 DIAGNOSIS — Z7982 Long term (current) use of aspirin: Secondary | ICD-10-CM | POA: Diagnosis not present

## 2019-06-05 DIAGNOSIS — Z96612 Presence of left artificial shoulder joint: Secondary | ICD-10-CM | POA: Diagnosis present

## 2019-06-05 DIAGNOSIS — F23 Brief psychotic disorder: Secondary | ICD-10-CM | POA: Diagnosis present

## 2019-06-05 DIAGNOSIS — I10 Essential (primary) hypertension: Secondary | ICD-10-CM | POA: Diagnosis present

## 2019-06-05 DIAGNOSIS — F22 Delusional disorders: Secondary | ICD-10-CM | POA: Diagnosis not present

## 2019-06-05 DIAGNOSIS — F1020 Alcohol dependence, uncomplicated: Secondary | ICD-10-CM | POA: Diagnosis present

## 2019-06-05 DIAGNOSIS — E7800 Pure hypercholesterolemia, unspecified: Secondary | ICD-10-CM | POA: Diagnosis present

## 2019-06-05 DIAGNOSIS — M5416 Radiculopathy, lumbar region: Secondary | ICD-10-CM | POA: Diagnosis present

## 2019-06-05 DIAGNOSIS — Z833 Family history of diabetes mellitus: Secondary | ICD-10-CM

## 2019-06-05 DIAGNOSIS — F419 Anxiety disorder, unspecified: Secondary | ICD-10-CM | POA: Diagnosis present

## 2019-06-05 DIAGNOSIS — M170 Bilateral primary osteoarthritis of knee: Secondary | ICD-10-CM | POA: Diagnosis present

## 2019-06-05 DIAGNOSIS — F329 Major depressive disorder, single episode, unspecified: Secondary | ICD-10-CM | POA: Diagnosis present

## 2019-06-05 DIAGNOSIS — Z590 Homelessness: Secondary | ICD-10-CM

## 2019-06-05 DIAGNOSIS — Z823 Family history of stroke: Secondary | ICD-10-CM | POA: Diagnosis not present

## 2019-06-05 DIAGNOSIS — Z885 Allergy status to narcotic agent status: Secondary | ICD-10-CM

## 2019-06-05 DIAGNOSIS — Z8249 Family history of ischemic heart disease and other diseases of the circulatory system: Secondary | ICD-10-CM | POA: Diagnosis not present

## 2019-06-05 HISTORY — DX: Anxiety disorder, unspecified: F41.9

## 2019-06-05 MED ORDER — IBUPROFEN 600 MG PO TABS
600.0000 mg | ORAL_TABLET | Freq: Once | ORAL | Status: AC
  Administered 2019-06-05: 600 mg via ORAL
  Filled 2019-06-05 (×2): qty 1

## 2019-06-05 MED ORDER — GABAPENTIN 400 MG PO CAPS
800.0000 mg | ORAL_CAPSULE | Freq: Three times a day (TID) | ORAL | Status: DC
  Administered 2019-06-05 – 2019-06-09 (×12): 800 mg via ORAL
  Filled 2019-06-05 (×22): qty 2

## 2019-06-05 MED ORDER — FUROSEMIDE 40 MG PO TABS
40.0000 mg | ORAL_TABLET | Freq: Every day | ORAL | Status: DC
  Administered 2019-06-06 – 2019-06-09 (×4): 40 mg via ORAL
  Filled 2019-06-05 (×6): qty 1

## 2019-06-05 MED ORDER — METOPROLOL TARTRATE 25 MG PO TABS
25.0000 mg | ORAL_TABLET | Freq: Two times a day (BID) | ORAL | Status: DC
  Administered 2019-06-05 – 2019-06-08 (×6): 25 mg via ORAL
  Filled 2019-06-05 (×12): qty 1

## 2019-06-05 MED ORDER — LOPERAMIDE HCL 2 MG PO CAPS: 2.0000 mg | ORAL_CAPSULE | ORAL | Status: AC | PRN

## 2019-06-05 MED ORDER — HYDROXYZINE HCL 25 MG PO TABS
25.0000 mg | ORAL_TABLET | Freq: Four times a day (QID) | ORAL | Status: AC | PRN
  Administered 2019-06-05 – 2019-06-07 (×4): 25 mg via ORAL
  Filled 2019-06-05 (×5): qty 1

## 2019-06-05 MED ORDER — THIAMINE HCL 100 MG/ML IJ SOLN: 100.0000 mg | Freq: Once | INTRAMUSCULAR | Status: DC

## 2019-06-05 MED ORDER — ONDANSETRON 4 MG PO TBDP: 4.0000 mg | ORAL_TABLET | Freq: Four times a day (QID) | ORAL | Status: AC | PRN

## 2019-06-05 MED ORDER — THIAMINE HCL 100 MG PO TABS
100.0000 mg | ORAL_TABLET | Freq: Every day | ORAL | Status: DC
  Filled 2019-06-05: qty 1

## 2019-06-05 MED ORDER — ADULT MULTIVITAMIN W/MINERALS CH: 1.0000 | ORAL_TABLET | Freq: Every day | ORAL | Status: DC

## 2019-06-05 MED ORDER — LORAZEPAM 1 MG PO TABS
1.0000 mg | ORAL_TABLET | Freq: Four times a day (QID) | ORAL | Status: DC | PRN
  Administered 2019-06-07: 1 mg via ORAL
  Filled 2019-06-05: qty 1

## 2019-06-05 MED ORDER — THIAMINE HCL 100 MG PO TABS
100.0000 mg | ORAL_TABLET | Freq: Every day | ORAL | Status: DC
  Administered 2019-06-06 – 2019-06-09 (×4): 100 mg via ORAL
  Filled 2019-06-05 (×6): qty 1

## 2019-06-05 MED ORDER — HALOPERIDOL LACTATE 5 MG/ML IJ SOLN: 5.0000 mg | Freq: Three times a day (TID) | INTRAMUSCULAR | Status: DC | PRN

## 2019-06-05 MED ORDER — ADULT MULTIVITAMIN W/MINERALS CH
1.0000 | ORAL_TABLET | Freq: Every day | ORAL | Status: DC
  Administered 2019-06-06 – 2019-06-09 (×4): 1 via ORAL
  Filled 2019-06-05 (×6): qty 1

## 2019-06-05 MED ORDER — QUETIAPINE FUMARATE 50 MG PO TABS
50.0000 mg | ORAL_TABLET | Freq: Every day | ORAL | Status: DC
  Administered 2019-06-05: 50 mg via ORAL
  Filled 2019-06-05 (×3): qty 1

## 2019-06-05 MED ORDER — ATORVASTATIN CALCIUM 20 MG PO TABS
20.0000 mg | ORAL_TABLET | Freq: Every day | ORAL | Status: DC
  Administered 2019-06-06 – 2019-06-09 (×4): 20 mg via ORAL
  Filled 2019-06-05 (×6): qty 1

## 2019-06-05 MED ORDER — ASPIRIN 81 MG PO TBEC
81.0000 mg | DELAYED_RELEASE_TABLET | Freq: Every day | ORAL | Status: DC
  Administered 2019-06-06 – 2019-06-09 (×4): 81 mg via ORAL
  Filled 2019-06-05 (×6): qty 1

## 2019-06-05 MED ORDER — HALOPERIDOL 5 MG PO TABS
5.0000 mg | ORAL_TABLET | Freq: Three times a day (TID) | ORAL | Status: DC | PRN
  Administered 2019-06-05: 5 mg via ORAL
  Filled 2019-06-05: qty 1

## 2019-06-05 NOTE — ED Provider Notes (Signed)
The patient has been placed in psychiatric observation due to the need to provide a safe environment for the patient while obtaining psychiatric consultation and evaluation, as well as ongoing medical and medication management to treat the patient's condition.  The patient has been placed under full IVC at this time.    Vanessa Merrill, MD 06/05/19 [phone]

## 2019-06-05 NOTE — Plan of Care (Signed)
Nurse discussed anxiety, depression, coping skills with patient. 

## 2019-06-05 NOTE — ED Notes (Addendum)
patient out of bed to nurses station with steady gait, requesting shower. Patient provided shower supplies, chair placed in shower for patients safety. Bed linen changed

## 2019-06-05 NOTE — ED Notes (Signed)
Assumed care of patient patient early riser. Denies pain or discomforts. Patient denied SI/HV/HI. As per prior nurse patient up out of bed and ambulated with a wide shuffle. Patient reports walks with a walker at home. Will continue to monitor and assess need to move to main ed due to safety and not being able to use walker in this unit. Safety maintained will monitor. Currently lying in bed watching TV.
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
## 2019-06-05 NOTE — ED Provider Notes (Signed)
Emergency Medicine Observation Re-evaluation Note  Tristan Cook is a 59 y.o. male, seen on rounds today.  Pt initially presented to the ED for complaints of Delusional and Knee Pain   Physical Exam  BP 137/85 (BP Location: Right Arm)   Pulse (!) 107   Temp 98.7 F (37.1 C) (Oral)   Resp 18   Ht 6\' 2"  (1.88 m)   Wt 86.2 kg   SpO2 99%   BMI 24.39 kg/m  Physical Exam  ED Course / MDM  EKG:    No changes noted.  Plan  Current plan is for psych evaluation  Patient is under full IVC at this time.   Vanessa Zap, MD 06/05/19 Shelah Lewandowsky

## 2019-06-05 NOTE — Progress Notes (Signed)
Pt accepted to [facility]; bed 305-2  Mordecai Maes, NP is the accepting provider.    Dr. Mallie Darting is the attending provider.    Call report to [phone]    [facility] ED notified.     Pt is under IVC and will be transported by Event organiser.     Pt is scheduled to arrive at [facility] at [place], [place], [place] Disposition CSW [facility]/TTS [phone]

## 2019-06-05 NOTE — Progress Notes (Signed)
Patient is 59 yrs old, voluntary, numerous  admissions to [facility].   Patient at [facility], needs to make appointment, needs glasses.  Stated he was at [facility] approximately 12 months ago because of L arm problems.  History of shoulder surgery and worker injury years ago.  Denied abuse.  High school graduate.  Denied using tobacco, no nicotine patch/gum needed.  Denied THC.  Alcohol, drinks 6 pack beer daily.  Start drinking "long time ago".  Last drank 4 days ago.  Denied SI and HI while talking to nurse, contracts for safety.  Denied A/V hallucinations.  Rated anxiety 2, denied depression and hopeless.  Has been living in motel by himself, or has been homeless, rides bus to appointments.  Last job 4 yrs ago at [employer].  Receives disability check now.   Patient has cane behind curtain in admission room.  Using walker at [facility].  Wearing arthritic gloves.  Given food/drink.  Oriented to 300 hall.  Skin assessment:  L surgical scar on upper L arm and shoulder.  Midline scar on mid chest.  R/L scar at breast bone nipple area.  Scar on R upper arm.  Two scars    Posterior side of R forearm, antecubital R arm. Legs/feet dry skin.  Abrasions/cuts on R lower leg.  Scratch on R knee cap.  Scar on L lower leg and kneecap.

## 2019-06-05 NOTE — Tx Team (Signed)
Initial Treatment Plan 06/05/2019 6:43 PM [name] [id number]    PATIENT STRESSORS: Financial difficulties Health problems Medication change or noncompliance Occupational concerns Substance abuse   PATIENT STRENGTHS: Ability for insight Active sense of humor Average or above average intelligence Capable of independent living Communication skills General fund of knowledge Motivation for treatment/growth Supportive family/friends   PATIENT IDENTIFIED PROBLEMS: "alcohol abuse"  "anxiety"  "depression"  "homeless"               DISCHARGE CRITERIA:  Ability to meet basic life and health needs Improved stabilization in mood, thinking, and/or behavior Reduction of life-threatening or endangering symptoms to within safe limits Safe-care adequate arrangements made Verbal commitment to aftercare and medication compliance Withdrawal symptoms are absent or subacute and managed without 24-hour nursing intervention  PRELIMINARY DISCHARGE PLAN: Attend aftercare/continuing care group Attend PHP/IOP Attend 12-step recovery group Outpatient therapy Return to previous living arrangement  PATIENT/FAMILY INVOLVEMENT: This treatment plan has been presented to and reviewed with the patient, Tristan Cook. The patient and family have been given the opportunity to ask questions and make suggestions.  Grayland Ormond [place], [place] 06/05/2019, 6:43 PM

## 2019-06-06 DIAGNOSIS — F22 Delusional disorders: Secondary | ICD-10-CM

## 2019-06-06 LAB — LIPID PANEL
Cholesterol: 166 mg/dL (ref 0–200)
HDL: 82 mg/dL (ref >40)
LDL Cholesterol: 56 mg/dL (ref 0–99)
Total CHOL/HDL Ratio: 2 RATIO
Triglycerides: 138 mg/dL (ref <150)
VLDL: 28 mg/dL (ref 0–40)

## 2019-06-06 LAB — HEMOGLOBIN A1C
Hgb A1c MFr Bld: 5.6 % (ref 4.8–5.6)
Mean Plasma Glucose: 114.02 mg/dL

## 2019-06-06 LAB — TSH: TSH: 1.878 u[IU]/mL (ref 0.350–4.500)

## 2019-06-06 MED ORDER — PANTOPRAZOLE SODIUM 40 MG PO TBEC
40.0000 mg | DELAYED_RELEASE_TABLET | Freq: Every day | ORAL | Status: DC
  Administered 2019-06-06 – 2019-06-09 (×4): 40 mg via ORAL
  Filled 2019-06-06 (×7): qty 1

## 2019-06-06 MED ORDER — RISPERIDONE 0.5 MG PO TABS
0.5000 mg | ORAL_TABLET | Freq: Once | ORAL | Status: AC
  Administered 2019-06-06: 0.5 mg via ORAL
  Filled 2019-06-06 (×2): qty 1

## 2019-06-06 MED ORDER — DULOXETINE HCL 20 MG PO CPEP
20.0000 mg | ORAL_CAPSULE | Freq: Every day | ORAL | Status: DC
  Administered 2019-06-06 – 2019-06-09 (×4): 20 mg via ORAL
  Filled 2019-06-06 (×7): qty 1

## 2019-06-06 MED ORDER — NAPROXEN 375 MG PO TABS
375.0000 mg | ORAL_TABLET | Freq: Three times a day (TID) | ORAL | Status: DC
  Administered 2019-06-06 – 2019-06-09 (×9): 375 mg via ORAL
  Filled 2019-06-06 (×16): qty 1

## 2019-06-06 MED ORDER — RISPERIDONE 1 MG PO TABS
1.0000 mg | ORAL_TABLET | Freq: Every day | ORAL | Status: DC
  Administered 2019-06-06: 1 mg via ORAL
  Filled 2019-06-06 (×3): qty 1

## 2019-06-06 NOTE — Progress Notes (Signed)
Adult Psychoeducational Group Note  Date:  06/06/2019 Time:  6:38 PM  Group Topic/Focus:  Goals Group:   The focus of this group is to help patients establish daily goals to achieve during treatment and discuss how the patient can incorporate goal setting into their daily lives to aide in recovery.  Participation Level:  Active  Participation Quality:  Appropriate  Affect:  Appropriate  Cognitive:  Alert  Insight: Appropriate  Engagement in Group:  Engaged  Modes of Intervention:  Discussion  Additional Comments:  Pt attended group and participated in discussion.  Elianne Gubser R Tanga Gloor 06/06/2019, 6:38 PM

## 2019-06-06 NOTE — Progress Notes (Signed)
   06/06/19 0100  Psych Admission Type (Psych Patients Only)  Admission Status Involuntary  Psychosocial Assessment  Patient Complaints Suspiciousness  Eye Contact Brief  Facial Expression Anxious;Sad;Worried  Affect Sad;Anxious;Apprehensive;Depressed  Speech Logical/coherent  Interaction Cautious  Motor Activity Slow;Unsteady  Appearance/Hygiene Disheveled  Behavior Characteristics Restless;Anxious  Mood Suspicious  Thought Process  Coherency WDL  Content WDL  Delusions None reported or observed  Perception WDL  Hallucination None reported or observed  Judgment Poor  Confusion None  Danger to Self  Current suicidal ideation? Denies  Danger to Others  Danger to Others None reported or observed

## 2019-06-06 NOTE — BHH Counselor (Signed)
Adult Comprehensive Assessment  Patient ID: Tristan Cook, male   DOB: 1960-09-05, 59 y.o.   MRN: [id number]  Information Source: Information source: Patient  Current Stressors:  Patient states their primary concerns and needs for treatment are: "I came here for medical purposes, not this" Patient states their goals for this hospitilization and ongoing recovery are: " I don't really know"   Education: N/A Employment:Unemployed; On disability  Family relationships: Patient denies any current stressors Financial: Limited income; [place] / Lack of housing: Pt reports "want to find a different place outside of [place]". Physical health (include injuries & life threatening diseases): CSW notes pt walks with a walker. Social relationships: Patient denies any current stressors  Substance abuse: Pt reports alcohol use. Bereavement/Grief: Patient denies any current stressors   Living/Environment/Situation:  Living Arrangements: Alone (patient reports he is currently living in a hotel) How long has patient lived in current situation?: 1 year What is atmosphere in current home: "it's alright, when I get my money I'm going to find a place"  Family History:  Marital status: Divorced Divorced, when?: 2019 What types of issues is patient dealing with in the relationship?: Pt reports "her two kids and her not wanting to do any work in raising them." Are you sexually active?: No What is your sexual orientation?: Heterosexual Has your sexual activity been affected by drugs, alcohol, medication, or emotional stress?: Pt denies. Does patient have children?: No  Childhood History:  By whom was/is the patient raised?: Mother Description of patient's relationship with caregiver when they were a child: Pt reports "uncivilized". Patient's description of current relationship with people who raised him/her: Pt reports "good". How were you disciplined when you got in trouble as a child/adolescent?: Pt  reports "wasn't". Does patient have siblings?: Yes Number of Siblings: 3 Description of patient's current relationship with siblings: Pt reports that he has a "good" relationship with his two brothers but no relationship with his sister. Did patient suffer any verbal/emotional/physical/sexual abuse as a child?: No Did patient suffer from severe childhood neglect?: No Has patient ever been sexually abused/assaulted/raped as an adolescent or adult?: No Was the patient ever a victim of a crime or a disaster?: No Witnessed domestic violence?: No Has patient been effected by domestic violence as an adult?: No  Education:  Highest grade of school patient has completed: 12th grade Currently a student?: No Learning disability?: Yes What learning problems does patient have?: Pt reports "dyslexia".  Employment/Work Situation:   Employment situation: On disability Why is patient on disability: Pt reports "my wrist, knees, ankles". How long has patient been on disability: Pt reports "Oct 2019" Patient's job has been impacted by current illness: No What is the longest time patient has a held a job?: 5 years Where was the patient employed at that time?: [employer] Did You Receive Any Psychiatric Treatment/Services While in the Military?: No(NA) Are There Guns or Other Weapons in [place]?: No  Financial Resources:   Museum/gallery curator resources: [employer], [employer], Food stamps Does patient have a Programmer, applications or guardian?: No  Alcohol/Substance Abuse:   What has been your use of drugs/alcohol within the last 12 months?: Patient endorsed drinking ETOH daily (4 beers); Denies any other substance use  If attempted suicide, did drugs/alcohol play a role in this?: No Alcohol/Substance Abuse Treatment Hx: Denies past history Has alcohol/substance abuse ever caused legal problems?: No Social Support System:   Pensions consultant Support System: Fair Describe Community Support System: Pt  reports "[employer]  and [place]".  Pt reports that this is a peer and cousin, respectively. Type of faith/religion: Pt denies.  Leisure/Recreation:   Leisure and Hobbies: Pt reports "nothing much, sleeping".  Strengths/Needs:   What is the patient's perception of their strengths?: Pt reports "do everything you possibly can". Patient states they can use these personal strengths during their treatment to contribute to their recovery: Pt reports "just telling the facts". Patient states these barriers may affect/interfere with their treatment: Pt denies. Patient states these barriers may affect their return to the community: Pt denies.  Discharge Plan:   Currently receiving community mental health services: No Patient states concerns and preferences for aftercare planning are: Patient expressed interest in outpatient referrals for medication management and therapy services.  Patient states they will know when they are safe and ready for discharge when:  "The docot needs to keep me until April 1st, cause if he let me go anytime before that, it is over" Does patient have access to transportation?: No (CSW will assist) Does patient have financial barriers related to discharge medications?: No Will patient be returning to same living situation after discharge?: Yes  Summary/Recommendations:   Summary and Recommendations (to be completed by the evaluator): Tristan Cook is a 59 year old male who is diagnosed with Acute psychosis. He presented to the hospital seeking treatment for bizarre behaviors including paranoia and delusions. During the assessment, Key was pleasant and cooperative with providing information. Tristan Cook states that he came to the hospital to be treated "medically", however he was "forced to come here". Tristan Cook reports he is not delusional and that he knows about a "massive murder" that occurred in [place] and that he is not "crazy". Tristan Cook reports that he drinks ETOH on a daily basis.  He reports he recently decreased his intake from a 12 pack of beer to 4 beers a day. Tristan Cook declined having any concerns regarding his substance use. Laverne reports he would like to be referred to a psychiatrist and therapist at discharge. Kevis can benefit from crisis stabilization, medication management, therapeutic milieu and referral services.  Marylee Floras. 06/06/2019

## 2019-06-06 NOTE — Progress Notes (Signed)
During the 0130 safety check, the patient asked this writer to lock him inside his room because he would feel safer. Pt is paranoid thinking someone is coming to get him and is responding to internal stimuli talking about a roommate he does not have.
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
## 2019-06-06 NOTE — BHH Group Notes (Cosign Needed)
[facility] LCSW Group Therapy Note   Date and Time: 3/26 @ 1:00pm   Type of Group and Topic: Psychoeducational Group: Discharge Planning and Individual Wellness  Participation Level: BHH PARTICIPATION LEVEL: Active    Description of Group: Discharge planning group reviews patient's anticipated discharge plans and assists patients to anticipate and address any barriers to wellness/recovery in the community. Suicide prevention education is reviewed with patients in group. Therapeutic Goals 1. Patients will state their anticipated discharge plan and mental health aftercare 2. Patients will identify potential barriers to wellness in the community setting 3. Patients will engage in problem solving, solution focused discussion of ways to anticipate and address barriers to wellness/recovery     Summary of Patient Progress: Pt stated the issues of housing and getting the help needed   Plan for Discharge/Comments: Get connected with housing resources   Transportation Means: Pt stated he wants to leave [place]     Therapeutic Modalities: Motivational Interviewing and Psycho-Education    Ovidio Kin, MSW intern

## 2019-06-06 NOTE — BHH Suicide Risk Assessment (Signed)
[facility] INPATIENT:  Family/Significant Other Suicide Prevention Education  Suicide Prevention Education:  Patient Refusal for Family/Significant Other Suicide Prevention Education: The patient Tristan Cook has refused to provide written consent for family/significant other to be provided Family/Significant Other Suicide Prevention Education during admission and/or prior to discharge.  Physician notified.  SPE completed with patient, as patient refused to consent to family contact. SPI pamphlet provided to pt and pt was encouraged to share information with support network, ask questions, and talk about any concerns relating to SPE. Patient denies access to guns/firearms and verbalized understanding of information provided. Mobile Crisis information also provided to patient.    Marylee Floras 06/06/2019, 11:00 AM

## 2019-06-06 NOTE — Tx Team (Signed)
Interdisciplinary Treatment and Diagnostic Plan Update  06/06/2019 Time of Session: 9:25am Tristan Cook MRN: [id number]  Principal Diagnosis: <principal problem not specified>  Secondary Diagnoses: Active Problems:   Acute psychosis ([place])   Current Medications:  Current Facility-Administered Medications  Medication Dose Route Frequency Provider Last Rate Last Admin  . aspirin EC tablet 81 mg  81 mg Oral Daily Sharma Covert, MD   81 mg at 06/06/19 0736  . atorvastatin (LIPITOR) tablet 20 mg  20 mg Oral Daily Sharma Covert, MD   20 mg at 06/06/19 0736  . furosemide (LASIX) tablet 40 mg  40 mg Oral Daily Sharma Covert, MD   40 mg at 06/06/19 0736  . gabapentin (NEURONTIN) capsule 800 mg  800 mg Oral TID Sharma Covert, MD   800 mg at 06/06/19 0737  . haloperidol (HALDOL) tablet 5 mg  5 mg Oral Q8H PRN Sharma Covert, MD   5 mg at 06/05/19 2303   Or  . haloperidol lactate (HALDOL) injection 5 mg  5 mg Intramuscular Q8H PRN Sharma Covert, MD      . hydrOXYzine (ATARAX/VISTARIL) tablet 25 mg  25 mg Oral Q6H PRN Sharma Covert, MD   25 mg at 06/06/19 0739  . loperamide (IMODIUM) capsule 2-4 mg  2-4 mg Oral PRN Sharma Covert, MD      . LORazepam (ATIVAN) tablet 1 mg  1 mg Oral Q6H PRN Sharma Covert, MD      . metoprolol tartrate (LOPRESSOR) tablet 25 mg  25 mg Oral BID Sharma Covert, MD   25 mg at 06/06/19 0737  . multivitamin with minerals tablet 1 tablet  1 tablet Oral Daily Sharma Covert, MD   1 tablet at 06/06/19 0737  . ondansetron (ZOFRAN-ODT) disintegrating tablet 4 mg  4 mg Oral Q6H PRN Sharma Covert, MD      . QUEtiapine (SEROQUEL) tablet 50 mg  50 mg Oral QHS Sharma Covert, MD   50 mg at 06/05/19 2106  . thiamine (B-1) injection 100 mg  100 mg Intramuscular Once Sharma Covert, MD      . thiamine tablet 100 mg  100 mg Oral Daily Sharma Covert, MD   100 mg at 06/06/19 0272   PTA Medications: Medications Prior  to Admission  Medication Sig Dispense Refill Last Dose  . aspirin EC 81 MG tablet Take 1 tablet (81 mg total) by mouth daily. 30 tablet 1   . atorvastatin (LIPITOR) 20 MG tablet Take 20 mg by mouth daily.      . furosemide (LASIX) 40 MG tablet Take 40 mg by mouth daily.      [name] gabapentin (NEURONTIN) 800 MG tablet Take 800 mg by mouth 3 (three) times daily.      . metoprolol tartrate (LOPRESSOR) 25 MG tablet Take 1 tablet (25 mg total) by mouth 2 (two) times daily. 60 tablet 1   . QUEtiapine (SEROQUEL) 50 MG tablet Take 1 tablet (50 mg total) by mouth at bedtime. 30 tablet 0     Patient Stressors: Financial difficulties Health problems Medication change or noncompliance Occupational concerns Substance abuse  Patient Strengths: Ability for insight Active sense of humor Average or above average intelligence Capable of independent living Communication skills General fund of knowledge Motivation for treatment/growth Supportive family/friends  Treatment Modalities: Medication Management, Group therapy, Case management,  1 to 1 session with clinician, Psychoeducation, Recreational therapy.   Physician Treatment Plan for  Primary Diagnosis: <principal problem not specified> Long Term Goal(s): Improvement in symptoms so as ready for discharge Improvement in symptoms so as ready for discharge   Short Term Goals: Ability to identify changes in lifestyle to reduce recurrence of condition will improve Ability to verbalize feelings will improve Ability to demonstrate self-control will improve Ability to identify and develop effective coping behaviors will improve Compliance with prescribed medications will improve Ability to identify triggers associated with substance abuse/mental health issues will improve  Medication Management: Evaluate patient's response, side effects, and tolerance of medication regimen.  Therapeutic Interventions: 1 to 1 sessions, Unit Group sessions and Medication  administration.  Evaluation of Outcomes: Not Met  Physician Treatment Plan for Secondary Diagnosis: Active Problems:   Acute psychosis ([place])  Long Term Goal(s): Improvement in symptoms so as ready for discharge Improvement in symptoms so as ready for discharge   Short Term Goals: Ability to identify changes in lifestyle to reduce recurrence of condition will improve Ability to verbalize feelings will improve Ability to demonstrate self-control will improve Ability to identify and develop effective coping behaviors will improve Compliance with prescribed medications will improve Ability to identify triggers associated with substance abuse/mental health issues will improve     Medication Management: Evaluate patient's response, side effects, and tolerance of medication regimen.  Therapeutic Interventions: 1 to 1 sessions, Unit Group sessions and Medication administration.  Evaluation of Outcomes: Not Met   RN Treatment Plan for Primary Diagnosis: <principal problem not specified> Long Term Goal(s): Knowledge of disease and therapeutic regimen to maintain health will improve  Short Term Goals: Ability to participate in decision making will improve, Ability to disclose and discuss suicidal ideas, Ability to identify and develop effective coping behaviors will improve and Compliance with prescribed medications will improve  Medication Management: RN will administer medications as ordered by provider, will assess and evaluate patient's response and provide education to patient for prescribed medication. RN will report any adverse and/or side effects to prescribing provider.  Therapeutic Interventions: 1 on 1 counseling sessions, Psychoeducation, Medication administration, Evaluate responses to treatment, Monitor vital signs and CBGs as ordered, Perform/monitor CIWA, COWS, AIMS and Fall Risk screenings as ordered, Perform wound care treatments as ordered.  Evaluation of Outcomes: Not  Met   LCSW Treatment Plan for Primary Diagnosis: <principal problem not specified> Long Term Goal(s): Safe transition to appropriate next level of care at discharge, Engage patient in therapeutic group addressing interpersonal concerns.  Short Term Goals: Engage patient in aftercare planning with referrals and resources  Therapeutic Interventions: Assess for all discharge needs, 1 to 1 time with Social worker, Explore available resources and support systems, Assess for adequacy in community support network, Educate family and significant other(s) on suicide prevention, Complete Psychosocial Assessment, Interpersonal group therapy.  Evaluation of Outcomes: Not Met   Progress in Treatment: Attending groups: No. New to unit  Participating in groups: No. Taking medication as prescribed: Yes. Toleration medication: Yes. Family/Significant other contact made: No, will contact:  if patient consents to collateral contacts Patient understands diagnosis: Yes. Discussing patient identified problems/goals with staff: Yes. Medical problems stabilized or resolved: Yes. Denies suicidal/homicidal ideation: Yes. Issues/concerns per patient self-inventory: No. Other:   New problem(s) identified: None   New Short Term/Long Term Goal(s):Detox, medication stabilization, elimination of SI thoughts, development of comprehensive mental wellness plan.    Patient Goals:  "I really dont know. I came here for my neuropathy and ended up in here"   Discharge Plan or Barriers: Patient recently admitted.  CSW will continue to follow and assess for appropriate referrals and possible discharge planning.    Reason for Continuation of Hospitalization: Anxiety Delusions  Depression Medication stabilization Suicidal ideation  Estimated Length of Stay: 3-5 days   Attendees: Patient: Tristan Cook  06/06/2019 9:26 AM  Physician: Dr. Myles Lipps, MD 06/06/2019 9:26 AM  Nursing:  06/06/2019 9:26 AM  RN Care  Manager: 06/06/2019 9:26 AM  Social Worker: Radonna Ricker, LCSW 06/06/2019 9:26 AM  Recreational Therapist:  06/06/2019 9:26 AM  Other:  06/06/2019 9:26 AM  Other:  06/06/2019 9:26 AM  Other: 06/06/2019 9:26 AM    Scribe for Treatment Team: Marylee Floras, [place] 06/06/2019 9:26 AM

## 2019-06-06 NOTE — Progress Notes (Signed)
Recreation Therapy Notes  Date: 3.26.21 Time: 0930 Location: [address] Group Room  Group Topic: Stress Management  Goal Area(s) Addresses:  Patient will identify positive stress management techniques. Patient will identify benefits of using stress management post d/c.  Intervention: Stress Management  Activity : [employer].  LRT led group in a meditation that focused on taking the characteristics of a mountain and bringing into your meditation and everyday life.  Patients were to listen and follow as meditation played.  Education:  Stress Management, Discharge Planning.   Education Outcome: Acknowledges Education  Clinical Observations/Feedback: Pt did not attend group session.    Victorino Sparrow, LRT/CTRS         Ria Comment, Ruby Dilone A 06/06/2019 11:15 AM
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
## 2019-06-06 NOTE — Progress Notes (Deleted)
   06/06/19 0100  Psych Admission Type (Psych Patients Only)  Admission Status Voluntary  Psychosocial Assessment  Patient Complaints Suspiciousness  Eye Contact Brief  Facial Expression Anxious;Sad;Worried  Affect Sad;Anxious;Apprehensive;Depressed  Speech Logical/coherent  Interaction Cautious  Motor Activity Slow;Unsteady  Appearance/Hygiene Disheveled  Behavior Characteristics Restless;Anxious  Mood Suspicious  Thought Process  Coherency WDL  Content WDL  Delusions None reported or observed  Perception WDL  Hallucination None reported or observed  Judgment Poor  Confusion None  Danger to Self  Current suicidal ideation? Denies  Danger to Others  Danger to Others None reported or observed   Pt paranoid and delusional at times. Pt seen talking to people not there.

## 2019-06-07 DIAGNOSIS — F1020 Alcohol dependence, uncomplicated: Secondary | ICD-10-CM

## 2019-06-07 MED ORDER — NAPROXEN 500 MG PO TABS
500.0000 mg | ORAL_TABLET | Freq: Once | ORAL | Status: DC
  Filled 2019-06-07: qty 1

## 2019-06-07 MED ORDER — RISPERIDONE 2 MG PO TABS
2.0000 mg | ORAL_TABLET | Freq: Every day | ORAL | Status: DC
  Administered 2019-06-07 – 2019-06-08 (×2): 2 mg via ORAL
  Filled 2019-06-07 (×5): qty 1

## 2019-06-07 NOTE — Progress Notes (Signed)
Adult Psychoeducational Group Note  Date:  06/07/2019 Time:  10:42 PM  Group Topic/Focus:  Wrap-Up Group:   The focus of this group is to help patients review their daily goal of treatment and discuss progress on daily workbooks.  Participation Level:  Active  Participation Quality:  Appropriate and Attentive  Affect:  Appropriate  Cognitive:  Appropriate  Insight: Appropriate  Engagement in Group:  Engaged  Modes of Intervention:  Activity  Additional Comments:  Pt attended and engaged in wrap up group. Pt goal for today was to work on lowering his blood pressure.Pt reports no thoughts of wanting to hurt self or others.  Pt reports that his relationship with family and support system is improving. Pt has no additional concerns or issues that need to be discussed. Pt rates his day a 8/10.   Chevy Virgo Lucy [place] 06/07/2019, 10:42 PM

## 2019-06-07 NOTE — Progress Notes (Signed)
   06/07/19 2331  COVID-19 Daily Checkoff  Have you had a fever (temp > 37.80C/100F)  in the past 24 hours?  No  If you have had runny nose, nasal congestion, sneezing in the past 24 hours, has it worsened? No  COVID-19 EXPOSURE  Have you traveled outside the state in the past 14 days? No  Have you been in contact with someone with a confirmed diagnosis of COVID-19 or PUI in the past 14 days without wearing appropriate PPE? No  Have you been living in the same home as a person with confirmed diagnosis of COVID-19 or a PUI (household contact)? No  Have you been diagnosed with COVID-19? No

## 2019-06-07 NOTE — Progress Notes (Signed)
[facility] MD Progress Note  06/07/2019 4:50 PM Tristan Cook  MRN:  [id number]  Subjective: Tristan Cook reports, "I'm doing great. I just had a little knee problem due to my arthritis. That is it".  Objective: Patient is a 59 year old male with a past psychiatric history significant for alcohol dependence who presented to the [facility] on 06/05/2019 for evaluation of knee pain. Upon discharge she became agitated and started making bizarre claims. Patient believed that his nieces had killed approximately 39 people in [place] last week. He stated that the FBI was after him due to being placed in a witness protection program. The patient had apparently been living in hotel to hotel secondary to homelessness. 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 Tristan Cook is seen & evaluated. He says he is doing well. Denies any new issues or concerns. He is visible on the unit, attending group sessions. He is taking & tolerating his treatment regimen.  Principal Problem: Delusional disorder ([place])  Diagnosis: Principal Problem:   Delusional disorder ([place]) Active Problems:   Alcohol use disorder, severe, dependence ([place])   Acute psychosis ([place])  Total Time spent with patient: 25 minutes  Past Psychiatric History: See H&P  Past Medical History:  Past Medical History:  Diagnosis Date  . Abdominal pain   . Adenomatous colon polyp 2008  . Anxiety   . Arthritis   . Back pain   . Bilateral lumbar radiculopathy   . Blurred vision, bilateral   . Chronic back pain   . Constipation   . Depression   . GERD (gastroesophageal reflux disease)   . Gout   . Hemorrhoids   . High cholesterol   . Hypertension   . Neck pain   . Neuropathy   . Neuropathy of both feet   . Numbness    rectal  . Numbness of legs   . Stroke ([place]) 2019  . Substance abuse ([place])    h/o excessive alcohol use; pt says he limits use to one to 2 beers daily he currently    Past  Surgical History:  Procedure Laterality Date  . CATARACT EXTRACTION W/PHACO Right 08/16/2018   Procedure: CATARACT EXTRACTION PHACO AND INTRAOCULAR LENS PLACEMENT RIGHT EYE;  Surgeon: Baruch Goldmann, MD;  Location: [facility] ORS;  Service: Ophthalmology;  Laterality: Right;  CDE: 4.18  . COLONOSCOPY  10/09/2006   3 mm pedunculated sigmoid colon polyp removed/8-mm sessile hepatic flexure polyp (tubular villous adenoma) removed/6-mm  descending colon polyp removed/small internal hemorrhoids  . COLONOSCOPY  02/28/2011   [id number], multiple in the rectum/Internal hemorrhoids, MODERATE-CAUSING RECTAL BLEEDING  . COLONOSCOPY N/A 07/05/2016   Procedure: COLONOSCOPY;  Surgeon: [name], MD;  Location: [facility] ENDO SUITE;  Service: Endoscopy;  Laterality: N/A;  11:15am  . 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 Mild erosive gastrtitis most likely due to indomethacin   . FINGER SURGERY Right    4th and 5th digit  . FLEXIBLE SIGMOIDOSCOPY  01/02/2012   SLF: 1. the colonic mucosa appeared normal in the sigmoid colon 2. Large internal hemorrhoids: cause for rectal bleeding/pain . s/p banding X 3   . HAND SURGERY    . REVERSE SHOULDER ARTHROPLASTY Left 05/29/2018   Procedure: REVERSE SHOULDER ARTHROPLASTY;  Surgeon: Hiram Gash, MD;  Location: [facility];  Service: Orthopedics;  Laterality: Left;  . SAVORY DILATION N/A 07/21/2014   Procedure: SAVORY DILATION;  Surgeon: [name], MD;  Location: [facility]  ENDO SUITE;  Service: Endoscopy;  Laterality: N/A;   Family History:  Family History  Problem Relation Age of Onset  . Hypertension Mother   . Hypertension Father   . Stroke Father   . Hypertension Sister   . Hypertension Brother   . Cancer Brother   . Cancer Brother        throat cancer  . Hypertension Brother   . Diabetes Maternal Grandmother   . Colon cancer Neg Hx    Family Psychiatric  History: See H&P  Social History:  Social History    Substance and Sexual Activity  Alcohol Use Yes  . Alcohol/week: 6.0 standard drinks  . Types: 6 Cans of beer per week   Comment: six pack beer daily     Social History   Substance and Sexual Activity  Drug Use No    Social History   Socioeconomic History  . Marital status: Single    Spouse name: Not on file  . Number of children: 0  . Years of education: 85  . Highest education level: Not on file  Occupational History  . Occupation: unemployed, Retail buyer: UNEMPLOYED  Tobacco Use  . Smoking status: Former Smoker    Quit date: 08/16/2004    Years since quitting: 14.8  . Smokeless tobacco: Current User  . Tobacco comment: no tobacco, has quit long time ago  Substance and Sexual Activity  . Alcohol use: Yes    Alcohol/week: 6.0 standard drinks    Types: 6 Cans of beer per week    Comment: six pack beer daily  . Drug use: No  . Sexual activity: Not on file  Other Topics Concern  . Not on file  Social History Narrative   Lives alone   No caffeine   Right handed   Social Determinants of Health   Financial Resource Strain:   . Difficulty of Paying Living Expenses:   Food Insecurity:   . Worried About Charity fundraiser in the Last Year:   . Arboriculturist in the Last Year:   Transportation Needs:   . Film/video editor (Medical):   [name] Lack of Transportation (Non-Medical):   Physical Activity:   . Days of Exercise per Week:   . Minutes of Exercise per Session:   Stress:   . Feeling of Stress :   Social Connections:   . Frequency of Communication with Friends and Family:   . Frequency of Social Gatherings with Friends and Family:   . Attends Religious Services:   . Active Member of Clubs or Organizations:   . Attends Archivist Meetings:   [name] Marital Status:    Additional Social History:    Pain Medications: see MAR Prescriptions: see MAR Over the Counter: see MAR History of alcohol / drug use?: Yes Longest period of  sobriety (when/how long): unsure Negative Consequences of Use: Financial, Personal relationships Withdrawal Symptoms: Other (Comment)(denied withdrawals) Name of Substance 1: alcohol 1 - Age of First Use: "long time ago" 1 - Amount (size/oz): 6 pack beer daily 1 - Frequency: daily 1 - Duration: "long time" 1 - Last Use / Amount: several days ago  Sleep: Good  Appetite:  Good  Current Medications: Current Facility-Administered Medications  Medication Dose Route Frequency Provider Last Rate Last Admin  . aspirin EC tablet 81 mg  81 mg Oral Daily Sharma Covert, MD   81 mg at 06/07/19 [id number]  . atorvastatin (LIPITOR) tablet 20  mg  20 mg Oral Daily Sharma Covert, MD   20 mg at 06/07/19 [id number]  . DULoxetine (CYMBALTA) DR capsule 20 mg  20 mg Oral Daily Sharma Covert, MD   20 mg at 06/07/19 [id number]  . furosemide (LASIX) tablet 40 mg  40 mg Oral Daily Sharma Covert, MD   40 mg at 06/07/19 [id number]  . gabapentin (NEURONTIN) capsule 800 mg  800 mg Oral TID Sharma Covert, MD   800 mg at 06/07/19 1208  . haloperidol (HALDOL) tablet 5 mg  5 mg Oral Q8H PRN Sharma Covert, MD   5 mg at 06/05/19 2303   Or  . haloperidol lactate (HALDOL) injection 5 mg  5 mg Intramuscular Q8H PRN Sharma Covert, MD      . hydrOXYzine (ATARAX/VISTARIL) tablet 25 mg  25 mg Oral Q6H PRN Sharma Covert, MD   25 mg at 06/06/19 2223  . loperamide (IMODIUM) capsule 2-4 mg  2-4 mg Oral PRN Sharma Covert, MD      . LORazepam (ATIVAN) tablet 1 mg  1 mg Oral Q6H PRN Sharma Covert, MD      . metoprolol tartrate (LOPRESSOR) tablet 25 mg  25 mg Oral BID Sharma Covert, MD   25 mg at 06/07/19 [id number]  . multivitamin with minerals tablet 1 tablet  1 tablet Oral Daily Sharma Covert, MD   1 tablet at 06/07/19 [id number]  . naproxen (NAPROSYN) tablet 375 mg  375 mg Oral TID WC Sharma Covert, MD   375 mg at 06/07/19 1208  . ondansetron (ZOFRAN-ODT) disintegrating tablet 4 mg  4 mg Oral Q6H PRN  Sharma Covert, MD      . pantoprazole (PROTONIX) EC tablet 40 mg  40 mg Oral Daily Sharma Covert, MD   40 mg at 06/07/19 [id number]  . risperiDONE (RISPERDAL) tablet 2 mg  2 mg Oral QHS Sharma Covert, MD      . thiamine (B-1) injection 100 mg  100 mg Intramuscular Once Sharma Covert, MD      . thiamine tablet 100 mg  100 mg Oral Daily Sharma Covert, MD   100 mg at 06/07/19 [id number]   Lab Results:  Results for orders placed or performed during the hospital encounter of 06/05/19 (from the past 48 hour(s))  Hemoglobin A1c     Status: None   Collection Time: 06/06/19  6:57 AM  Result Value Ref Range   Hgb A1c MFr Bld 5.6 4.8 - 5.6 %    Comment: REPEATED TO VERIFY (NOTE) Pre diabetes:          5.7%-6.4% Diabetes:              >6.4% Glycemic control for   <7.0% adults with diabetes    Mean Plasma Glucose 114.02 mg/dL    Comment: Performed at [facility], [place] [address] [place], [place] [address]  Lipid panel     Status: None   Collection Time: 06/06/19  6:57 AM  Result Value Ref Range   Cholesterol 166 0 - 200 mg/dL   Triglycerides 138 <150 mg/dL   HDL 82 >40 mg/dL   Total CHOL/HDL Ratio 2.0 RATIO   VLDL 28 0 - 40 mg/dL   LDL Cholesterol 56 0 - 99 mg/dL    Comment:        Total Cholesterol/HDL:CHD Risk Coronary Heart Disease Risk Table  Men   Women  1/2 Average Risk   3.4   3.3  Average Risk       5.0   4.4  2 X Average Risk   9.6   7.1  3 X Average Risk  23.4   11.0        Use the calculated Patient Ratio above and the CHD Risk Table to determine the patient's CHD Risk.        ATP III CLASSIFICATION (LDL):  <100     mg/dL   Optimal  100-129  mg/dL   Near or Above                    Optimal  130-159  mg/dL   Borderline  160-189  mg/dL   High  >190     mg/dL   Very High Performed at [place] [address], [place], [place] [address]   TSH     Status: None   Collection Time: 06/06/19  6:57 AM  Result  Value Ref Range   TSH 1.878 0.350 - 4.500 uIU/mL    Comment: Performed by a 3rd Generation assay with a functional sensitivity of <=0.01 uIU/mL. Performed at [facility], [place] [address] [place], [place] [address]     Blood Alcohol level:  Lab Results  Component Value Date   ETH <10 06/04/2019   ETH <10 [id number]   Metabolic Disorder Labs: Lab Results  Component Value Date   HGBA1C 5.6 06/06/2019   MPG 114.02 06/06/2019   MPG 108.28 08/27/2018   No results found for: PROLACTIN Lab Results  Component Value Date   CHOL 166 06/06/2019   TRIG 138 06/06/2019   HDL 82 06/06/2019   CHOLHDL 2.0 06/06/2019   VLDL 28 06/06/2019   LDLCALC 56 06/06/2019   LDLCALC UNABLE TO CALCULATE IF TRIGLYCERIDE OVER 400 mg/dL 08/27/2018   Physical Findings: AIMS: Facial and Oral Movements Muscles of Facial Expression: None, normal Lips and Perioral Area: None, normal Jaw: None, normal Tongue: None, normal,Extremity Movements Upper (arms, wrists, hands, fingers): None, normal Lower (legs, knees, ankles, toes): None, normal, Trunk Movements Neck, shoulders, hips: None, normal, Overall Severity Severity of abnormal movements (highest score from questions above): None, normal Incapacitation due to abnormal movements: None, normal Patient's awareness of abnormal movements (rate only patient's report): No Awareness, Dental Status Current problems with teeth and/or dentures?: No Does patient usually wear dentures?: No  CIWA:  CIWA-Ar Total: 1 COWS:  COWS Total Score: 2  Musculoskeletal: Strength & Muscle Tone: within normal limits Gait & Station: normal Patient leans: N/A  Psychiatric Specialty Exam: Physical Exam  Nursing note and vitals reviewed. Constitutional: He is oriented to person, place, and time. He appears well-developed.  Cardiovascular: Normal rate.  Respiratory: Effort normal.  Genitourinary:    Genitourinary Comments: Deferred   Musculoskeletal:         General: Normal range of motion.     Cervical back: Normal range of motion.  Neurological: He is alert and oriented to person, place, and time.  Skin: Skin is warm and dry.    Review of Systems  Blood pressure 120/80, pulse (!) 103, temperature 98.1 F (36.7 C), resp. rate 18, height 6\' 2"  (1.88 m), weight 78 kg, SpO2 100 %.Body mass index is 22.08 kg/m.  General Appearance: Disheveled  Eye Contact:  Fair  Speech:  Slow  Volume:  Decreased  Mood:  Depressed  Affect:  Constricted  Thought Process:  Coherent  Orientation:  Full (Time, Place, and Person)  Thought Content:  Logical and Hallucinations: Auditory Visual  Suicidal Thoughts:  No  Homicidal Thoughts:  No  Memory:  Immediate;   Fair Recent;   Fair Remote;   Fair  Judgement:  Fair  Insight:  Fair  Psychomotor Activity:  Decreased  Concentration:  Concentration: Poor  Recall:  [employer] of Knowledge:  Fair  Language:  Fair  Akathisia:  No  Handed:  Right  AIMS (if indicated):     Assets:  Desire for Improvement Housing  ADL's:  Impaired  Cognition:  Impaired,  Mild    Sleep:  Number of Hours: 6   Treatment Plan Summary: Daily contact with patient to assess and evaluate symptoms and progress in treatment and Medication management.  -Continue inpatient hospitalization.  -Will continue today 06/07/2019 plan as below except where it is noted.  -Mood control   -Continue risperdal 2 mg po qhs.              -Continue Duloxetine 20 mg po daily for depression  -Anxiety/agitation  -Continue atarax 25 mg po q6h prn for CIWA >10             -Continue gabapentin 800 mg po tid.             -Continue Lorazepam 1 mg po Q 6 hrs prn for CIWA >10 x 72 hrs  -agitation    -Continue Haldol 5 mg po Q 8 hrs prn.              Or             -Continue Haldol 5 mg mg IM 8 hrs prn.  -Other medical issues.  -Continue ASA 81 mg po daily for heart health.   -Continue Metoprolol 25 mg po qd for HTN.             -Continue Lasix  40 mg po qd for swellings.             -Continue Lipitor 20 mg po Q evening for high cholesterol.             -Continue Zofran -ODT 4 mg po Q 6 hrs prn for nausea.             -Continue Protonix 40 mg po Q am for GERD.             -Continue Thiamine 100 mg po daily for thiamine deficiency.              -Continue naprosyn 375 mg po tid for arthritis pain.  -Encourage participation in groups and therapeutic milieu  -Disposition planning will be ongoing   Lindell Spar, NP, PMHNP, FNP-BC 06/07/2019, 4:50 PM

## 2019-06-07 NOTE — BHH Group Notes (Signed)
LCSW Group Therapy Note  06/07/2019   1100a  Type of Therapy and Topic:  Group Therapy: Anger Cues and Responses  Participation Level:  None   Description of Group:   In this group, patients learned how to recognize the physical, cognitive, emotional, and behavioral responses they have to anger-provoking situations.  They identified a recent time they became angry and how they reacted.  They analyzed how their reaction was possibly beneficial and how it was possibly unhelpful.  The group discussed a variety of healthier coping skills that could help with such a situation in the future.  Focus was placed on how helpful it is to recognize the underlying emotions to our anger, because working on those can lead to a more permanent solution as well as our ability to focus on the important rather than the urgent.  Therapeutic Goals: 1. Patients will remember their last incident of anger and how they felt emotionally and physically, what their thoughts were at the time, and how they behaved. 2. Patients will identify how their behavior at that time worked for them, as well as how it worked against them. 3. Patients will explore possible new behaviors to use in future anger situations. 4. Patients will learn that anger itself is normal and cannot be eliminated, and that healthier reactions can assist with resolving conflict rather than worsening situations.  Summary of Patient Progress:  The patient engaged in opening introductions, sharing of his experience and understanding of anger being "aggression". Pt did not engage further in discussion, providing no additional feedback regarding the last instance of anger, responses to anger, or alternate more effective means of resolution. Pt was removed from group prior to closing for consultation with NP.  Therapeutic Modalities:   Cognitive Behavioral Therapy    Katherina Mires 06/07/2019  3:54 PM

## 2019-06-07 NOTE — Progress Notes (Signed)
   06/06/19 0100  Psych Admission Type (Psych Patients Only)  Admission Status Involuntary  Psychosocial Assessment  Patient Complaints Suspiciousness  Eye Contact Brief  Facial Expression Anxious;Sad;Worried  Affect Sad;Anxious;Apprehensive;Depressed  Speech Logical/coherent  Interaction Cautious  Motor Activity Slow;Unsteady  Appearance/Hygiene Disheveled  Behavior Characteristics Restless;Anxious  Mood Suspicious  Thought Process  Coherency WDL  Content WDL  Delusions None reported or observed  Perception WDL  Hallucination None reported or observed  Judgment Poor  Confusion None  Danger to Self  Current suicidal ideation? Denies  Danger to Others  Danger to Others None reported or observed  No overt psychosis when speaking with him tonight. Animated at times. Risperdal given.

## 2019-06-07 NOTE — Progress Notes (Signed)
   06/07/19 2336  Psych Admission Type (Psych Patients Only)  Admission Status Involuntary  Psychosocial Assessment  Patient Complaints Anxiety  Eye Contact Fair  Facial Expression Animated  Affect Appropriate to circumstance  Speech Logical/coherent  Interaction Assertive  Motor Activity Slow  Appearance/Hygiene Unremarkable  Behavior Characteristics Appropriate to situation  Mood Depressed;Pleasant  Thought Process  Coherency WDL  Content WDL  Delusions None reported or observed  Perception WDL  Hallucination None reported or observed  Judgment Poor  Confusion None  Danger to Self  Current suicidal ideation? Denies  Danger to Others  Danger to Others None reported or observed

## 2019-06-07 NOTE — Progress Notes (Signed)
   06/07/19 1400  Psych Admission Type (Psych Patients Only)  Admission Status Involuntary  Psychosocial Assessment  Patient Complaints Anxiety;Depression  Eye Contact Fair  Facial Expression Other (Comment) (appropriate to circumstance)  Affect Anxious;Depressed  Speech Logical/coherent  Interaction Assertive  Motor Activity Slow  Appearance/Hygiene Unremarkable  Behavior Characteristics Cooperative  Mood Depressed;Anxious  Thought Process  Coherency WDL  Content WDL  Delusions None reported or observed  Perception WDL  Hallucination None reported or observed  Judgment Poor  Confusion None  Danger to Self  Current suicidal ideation? Denies  Danger to Others  Danger to Others None reported or observed   Pt reports that his pain level was 9 earlier this morning and currently a 6. He says it stays around 6 when he is taking his medication. He says that he has been to an eye doctor in [place] and [place] with recommendation to go to [facility] for eye surgery. He has pressure buildup around the optic nerve. Pt is pleasant interacting on the unit.He denies si.

## 2019-06-08 MED ORDER — METOPROLOL TARTRATE 12.5 MG HALF TABLET
12.5000 mg | ORAL_TABLET | Freq: Two times a day (BID) | ORAL | Status: DC
  Administered 2019-06-08 – 2019-06-09 (×2): 12.5 mg via ORAL
  Filled 2019-06-08 (×6): qty 1

## 2019-06-08 NOTE — Plan of Care (Signed)
Nurse discussed anxiety, depression and coping skills with patient.  

## 2019-06-08 NOTE — BHH Group Notes (Signed)
[place] LCSW Group Therapy Note  Date/Time:  06/08/2019 10:00a  Type of Therapy and Topic:  Group Therapy:  Healthy and Unhealthy Supports  Participation Level:  Minimal   Description of Group:  Patients in this group were introduced to the idea of adding a variety of healthy supports to address the various needs in their lives.Patients discussed what additional healthy supports could be helpful in their recovery and wellness after discharge in order to prevent future hospitalizations.   An emphasis was placed on using counselor, doctor, therapy groups, 12-step groups, and problem-specific support groups to expand supports.  They also worked as a group on developing a specific plan for several patients to deal with unhealthy supports through [place], psychoeducation with loved ones, and even termination of relationships.   Therapeutic Goals:   1)  discuss importance of adding supports to stay well once out of the hospital  2)  compare healthy versus unhealthy supports and identify some examples of each  3)  generate ideas and descriptions of healthy supports that can be added  4)  offer mutual support about how to address unhealthy supports  5)  encourage active participation in and adherence to discharge plan    Summary of Patient Progress:  The patient actively participated in introductory check-in, sharing of feeling "good" this morning. Pt stated that current healthy supports in his life are a close friend of his that he has been able to call on for many years, while not sharing of any current unhealthy supports.  The patient expressed not needing to add any additional support(s) to help in his recovery journey, however reconnecting with his close friend. Pt proved to engage minimally throughout duration of group however proved receptive and acknowledging of alternate group members input and facilitators feedback.   Therapeutic Modalities:   Motivational  Interviewing Brief Solution-Focused Therapy  Tristan Cook 06/08/2019  12:20 PM

## 2019-06-08 NOTE — Progress Notes (Signed)
   06/08/19 2315  COVID-19 Daily Checkoff  Have you had a fever (temp > 37.80C/100F)  in the past 24 hours?  No  If you have had runny nose, nasal congestion, sneezing in the past 24 hours, has it worsened? No  COVID-19 EXPOSURE  Have you traveled outside the state in the past 14 days? No  Have you been in contact with someone with a confirmed diagnosis of COVID-19 or PUI in the past 14 days without wearing appropriate PPE? No  Have you been living in the same home as a person with confirmed diagnosis of COVID-19 or a PUI (household contact)? No  Have you been diagnosed with COVID-19? No

## 2019-06-08 NOTE — Progress Notes (Signed)
   06/08/19 2317  Psych Admission Type (Psych Patients Only)  Admission Status Involuntary  Psychosocial Assessment  Patient Complaints Depression  Eye Contact Fair  Facial Expression Animated  Affect Appropriate to circumstance  Speech Logical/coherent  Interaction Assertive  Motor Activity Slow  Appearance/Hygiene Unremarkable  Behavior Characteristics Appropriate to situation  Mood Pleasant  Thought Process  Content WDL  Delusions None reported or observed  Perception WDL  Hallucination None reported or observed  Judgment Poor  Confusion None  Danger to Self  Current suicidal ideation? Denies  Danger to Others  Danger to Others None reported or observed

## 2019-06-08 NOTE — Progress Notes (Signed)
Patient stated at home he takes metropolol 25 mg in the morning and metropolol 25 mg in the afternoon, which adds up to 50 mgs daily.Patient has been taking 50 mgs in the past several years.

## 2019-06-08 NOTE — Progress Notes (Signed)
[facility] MD Progress Note  06/08/2019 2:37 PM Tristan Cook  MRN:  [id number]  Subjective: Tristan Cook reports, "I'm doing good. My mood is great. It is just that my ankles are swollen".  Objective: Patient is a 59 year old male with a past psychiatric history significant for alcohol dependence who presented to the [facility] on 06/05/2019 for evaluation of knee pain. Upon discharge she became agitated and started making bizarre claims. Patient believed that his nieces had killed approximately 32 people in [place] last week. He stated that the FBI was after him due to being placed in a witness protection program. The patient had apparently been living in hotel to hotel secondary to homelessness. 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 Tristan Cook is seen & evaluated. He says he is doing well mood wise. Denies any new issues or concerns. However, he is complaining of swollen ankles. He is already on lasix 40 mg po daily. He is instructed & encouraged to elevate his lower extremities when in bed or sitting up in a chair. He is visible on the unit, attending group sessions. He is taking & tolerating his treatment regimen. He is in agreement to continue his current plan of care as already in progress.  Principal Problem: Delusional disorder ([place])  Diagnosis: Principal Problem:   Delusional disorder ([place]) Active Problems:   Alcohol use disorder, severe, dependence ([place])   Acute psychosis ([place])  Total Time spent with patient: 25 minutes  Past Psychiatric History: See H&P  Past Medical History:  Past Medical History:  Diagnosis Date  . Abdominal pain   . Adenomatous colon polyp 2008  . Anxiety   . Arthritis   . Back pain   . Bilateral lumbar radiculopathy   . Blurred vision, bilateral   . Chronic back pain   . Constipation   . Depression   . GERD (gastroesophageal reflux disease)   . Gout   . Hemorrhoids   . High cholesterol   .  Hypertension   . Neck pain   . Neuropathy   . Neuropathy of both feet   . Numbness    rectal  . Numbness of legs   . Stroke ([place]) 2019  . Substance abuse ([place])    h/o excessive alcohol use; pt says he limits use to one to 2 beers daily he currently    Past Surgical History:  Procedure Laterality Date  . CATARACT EXTRACTION W/PHACO Right 08/16/2018   Procedure: CATARACT EXTRACTION PHACO AND INTRAOCULAR LENS PLACEMENT RIGHT EYE;  Surgeon: Baruch Goldmann, MD;  Location: [facility];  Service: Ophthalmology;  Laterality: Right;  CDE: 4.18  . COLONOSCOPY  10/09/2006   3 mm pedunculated sigmoid colon polyp removed/8-mm sessile hepatic flexure polyp (tubular villous adenoma) removed/6-mm  descending colon polyp removed/small internal hemorrhoids  . COLONOSCOPY  02/28/2011   [id number], multiple in the rectum/Internal hemorrhoids, MODERATE-CAUSING RECTAL BLEEDING  . COLONOSCOPY N/A 07/05/2016   Procedure: COLONOSCOPY;  Surgeon: [name], MD;  Location: [facility];  Service: Endoscopy;  Laterality: N/A;  11:15am  . 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 Mild erosive gastrtitis most likely due to indomethacin   . FINGER SURGERY Right    4th and 5th digit  . FLEXIBLE SIGMOIDOSCOPY  01/02/2012   SLF: 1. the colonic mucosa appeared normal in the sigmoid colon 2. Large internal hemorrhoids: cause for rectal bleeding/pain . s/p banding X 3   . HAND SURGERY    .  REVERSE SHOULDER ARTHROPLASTY Left 05/29/2018   Procedure: REVERSE SHOULDER ARTHROPLASTY;  Surgeon: Hiram Gash, MD;  Location: [place];  Service: Orthopedics;  Laterality: Left;  . SAVORY DILATION N/A 07/21/2014   Procedure: SAVORY DILATION;  Surgeon: [name], MD;  Location: [facility];  Service: Endoscopy;  Laterality: N/A;   Family History:  Family History  Problem Relation Age of Onset  . Hypertension Mother   . Hypertension Father   . Stroke Father   .  Hypertension Sister   . Hypertension Brother   . Cancer Brother   . Cancer Brother        throat cancer  . Hypertension Brother   . Diabetes Maternal Grandmother   . Colon cancer Neg Hx    Family Psychiatric  History: See H&P  Social History:  Social History   Substance and Sexual Activity  Alcohol Use Yes  . Alcohol/week: 6.0 standard drinks  . Types: 6 Cans of beer per week   Comment: six pack beer daily     Social History   Substance and Sexual Activity  Drug Use No    Social History   Socioeconomic History  . Marital status: Single    Spouse name: Not on file  . Number of children: 0  . Years of education: 24  . Highest education level: Not on file  Occupational History  . Occupation: unemployed, Retail buyer: UNEMPLOYED  Tobacco Use  . Smoking status: Former Smoker    Quit date: 08/16/2004    Years since quitting: 14.8  . Smokeless tobacco: Current User  . Tobacco comment: no tobacco, has quit long time ago  Substance and Sexual Activity  . Alcohol use: Yes    Alcohol/week: 6.0 standard drinks    Types: 6 Cans of beer per week    Comment: six pack beer daily  . Drug use: No  . Sexual activity: Not on file  Other Topics Concern  . Not on file  Social History Narrative   Lives alone   No caffeine   Right handed   Social Determinants of Health   Financial Resource Strain:   . Difficulty of Paying Living Expenses:   Food Insecurity:   . Worried About Charity fundraiser in the Last Year:   . Arboriculturist in the Last Year:   Transportation Needs:   . Film/video editor (Medical):   [name] Lack of Transportation (Non-Medical):   Physical Activity:   . Days of Exercise per Week:   . Minutes of Exercise per Session:   Stress:   . Feeling of Stress :   Social Connections:   . Frequency of Communication with Friends and Family:   . Frequency of Social Gatherings with Friends and Family:   . Attends Religious Services:   . Active  Member of Clubs or Organizations:   . Attends Archivist Meetings:   [name] Marital Status:    Additional Social History:    Pain Medications: see MAR Prescriptions: see MAR Over the Counter: see MAR History of alcohol / drug use?: Yes Longest period of sobriety (when/how long): unsure Negative Consequences of Use: Financial, Personal relationships Withdrawal Symptoms: Other (Comment)(denied withdrawals) Name of Substance 1: alcohol 1 - Age of First Use: "long time ago" 1 - Amount (size/oz): 6 pack beer daily 1 - Frequency: daily 1 - Duration: "long time" 1 - Last Use / Amount: several days ago  Sleep: Good  Appetite:  Good  Current Medications: Current Facility-Administered Medications  Medication Dose Route Frequency Provider Last Rate Last Admin  . aspirin EC tablet 81 mg  81 mg Oral Daily Sharma Covert, MD   81 mg at 06/08/19 0732  . atorvastatin (LIPITOR) tablet 20 mg  20 mg Oral Daily Sharma Covert, MD   20 mg at 06/08/19 0731  . DULoxetine (CYMBALTA) DR capsule 20 mg  20 mg Oral Daily Sharma Covert, MD   20 mg at 06/08/19 0735  . furosemide (LASIX) tablet 40 mg  40 mg Oral Daily Sharma Covert, MD   40 mg at 06/08/19 0731  . gabapentin (NEURONTIN) capsule 800 mg  800 mg Oral TID Sharma Covert, MD   800 mg at 06/08/19 1124  . haloperidol (HALDOL) tablet 5 mg  5 mg Oral Q8H PRN Sharma Covert, MD   5 mg at 06/05/19 2303   Or  . haloperidol lactate (HALDOL) injection 5 mg  5 mg Intramuscular Q8H PRN Sharma Covert, MD      . hydrOXYzine (ATARAX/VISTARIL) tablet 25 mg  25 mg Oral Q6H PRN Sharma Covert, MD   25 mg at 06/07/19 2126  . loperamide (IMODIUM) capsule 2-4 mg  2-4 mg Oral PRN Sharma Covert, MD      . metoprolol tartrate (LOPRESSOR) tablet 12.5 mg  12.5 mg Oral BID Sharma Covert, MD      . multivitamin with minerals tablet 1 tablet  1 tablet Oral Daily Sharma Covert, MD   1 tablet at 06/08/19 0732  . naproxen  (NAPROSYN) tablet 375 mg  375 mg Oral TID WC Sharma Covert, MD   375 mg at 06/08/19 1124  . ondansetron (ZOFRAN-ODT) disintegrating tablet 4 mg  4 mg Oral Q6H PRN Sharma Covert, MD      . pantoprazole (PROTONIX) EC tablet 40 mg  40 mg Oral Daily Sharma Covert, MD   40 mg at 06/08/19 0730  . risperiDONE (RISPERDAL) tablet 2 mg  2 mg Oral QHS Sharma Covert, MD   2 mg at 06/07/19 2126  . thiamine (B-1) injection 100 mg  100 mg Intramuscular Once Sharma Covert, MD      . thiamine tablet 100 mg  100 mg Oral Daily Sharma Covert, MD   100 mg at 06/08/19 [id number]   Lab Results:  No results found for this or any previous visit (from the past 48 hour(s)).  Blood Alcohol level:  Lab Results  Component Value Date   ETH <10 06/04/2019   ETH <10 [id number]   Metabolic Disorder Labs: Lab Results  Component Value Date   HGBA1C 5.6 06/06/2019   MPG 114.02 06/06/2019   MPG 108.28 08/27/2018   No results found for: PROLACTIN Lab Results  Component Value Date   CHOL 166 06/06/2019   TRIG 138 06/06/2019   HDL 82 06/06/2019   CHOLHDL 2.0 06/06/2019   VLDL 28 06/06/2019   LDLCALC 56 06/06/2019   LDLCALC UNABLE TO CALCULATE IF TRIGLYCERIDE OVER 400 mg/dL 08/27/2018   Physical Findings: AIMS: Facial and Oral Movements Muscles of Facial Expression: None, normal Lips and Perioral Area: None, normal Jaw: None, normal Tongue: None, normal,Extremity Movements Upper (arms, wrists, hands, fingers): None, normal Lower (legs, knees, ankles, toes): None, normal, Trunk Movements Neck, shoulders, hips: None, normal, Overall Severity Severity of abnormal movements (highest score from questions above): None, normal Incapacitation due to abnormal movements: None, normal Patient's awareness of  abnormal movements (rate only patient's report): No Awareness, Dental Status Current problems with teeth and/or dentures?: No Does patient usually wear dentures?: No  CIWA:  CIWA-Ar Total:  1 COWS:  COWS Total Score: 2  Musculoskeletal: Strength & Muscle Tone: within normal limits Gait & Station: normal Patient leans: N/A  Psychiatric Specialty Exam: Physical Exam  Nursing note and vitals reviewed. Constitutional: He is oriented to person, place, and time. He appears well-developed.  Cardiovascular: Normal rate.  Respiratory: Effort normal.  Genitourinary:    Genitourinary Comments: Deferred   Musculoskeletal:        General: Normal range of motion.     Cervical back: Normal range of motion.  Neurological: He is alert and oriented to person, place, and time.  Skin: Skin is warm and dry.    Review of Systems  Constitutional: Negative for chills, diaphoresis and fever.  HENT: Negative for congestion, rhinorrhea, sneezing and sore throat.   Respiratory: Negative for cough, chest tightness, shortness of breath and wheezing.   Cardiovascular: Positive for leg swelling (On lasix 40 mg po daily.). Negative for chest pain.  Gastrointestinal: Negative for diarrhea, nausea and vomiting.  Endocrine: Negative for cold intolerance.  Genitourinary: Negative for difficulty urinating.  Musculoskeletal: Positive for arthralgias (Hx. of (still on going).) and joint swelling ( Hx. of (still on going).). Negative for gait problem.  Allergic/Immunologic: Negative for environmental allergies and food allergies.       Allergies: Hydrocodone, Benadryl  Neurological: Negative for dizziness, tremors, seizures, numbness and headaches.  Psychiatric/Behavioral: Positive for dysphoric mood ("Improving"). Negative for agitation, behavioral problems, confusion, decreased concentration, hallucinations (Hx. of), self-injury, sleep disturbance and suicidal ideas. The patient is not nervous/anxious and is not hyperactive.     Blood pressure 108/76, pulse 85, temperature 97.8 F (36.6 C), temperature source Oral, resp. rate 16, height 6\' 2"  (1.88 m), weight 78 kg, SpO2 100 %.Body mass index is 22.08  kg/m.  General Appearance: Disheveled  Eye Contact:  Fair  Speech:  Slow  Volume:  Decreased  Mood:  Depressed  Affect:  Constricted  Thought Process:  Coherent  Orientation:  Full (Time, Place, and Person)  Thought Content:  Logical and Hallucinations: Auditory Visual  Suicidal Thoughts:  No  Homicidal Thoughts:  No  Memory:  Immediate;   Fair Recent;   Fair Remote;   Fair  Judgement:  Fair  Insight:  Fair  Psychomotor Activity:  Decreased  Concentration:  Concentration: Poor  Recall:  [employer] of Knowledge:  Fair  Language:  Fair  Akathisia:  No  Handed:  Right  AIMS (if indicated):     Assets:  Desire for Improvement Housing  ADL's:  Impaired  Cognition:  Impaired,  Mild    Sleep:  Number of Hours: 6   Treatment Plan Summary: Daily contact with patient to assess and evaluate symptoms and progress in treatment and Medication management.  -Continue inpatient hospitalization.  -Will continue today 06/08/2019 plan as below except where it is noted.  -Mood control   -Continue risperdal 2 mg po qhs.              -Continue Duloxetine 20 mg po daily for depression  -Anxiety/agitation  -Continue atarax 25 mg po q6h prn for CIWA >10             -Continue gabapentin 800 mg po tid.             -Continue Lorazepam 1 mg po Q 6 hrs prn for CIWA >  10 x 72 hrs  -agitation    -Continue Haldol 5 mg po Q 8 hrs prn.              Or             -Continue Haldol 5 mg mg IM 8 hrs prn.  -Other medical issues.  -Continue ASA 81 mg po daily for heart health.   -Continue Metoprolol 25 mg po qd for HTN.             -Continue Lasix 40 mg po qd for swellings.             -Continue Lipitor 20 mg po Q evening for high cholesterol.             -Continue Zofran -ODT 4 mg po Q 6 hrs prn for nausea.             -Continue Protonix 40 mg po Q am for GERD.             -Continue Thiamine 100 mg po daily for thiamine deficiency.              -Continue naprosyn 375 mg po tid for arthritis  pain.  -Encourage participation in groups and therapeutic milieu  -Disposition planning will be ongoing   Lindell Spar, NP, PMHNP, FNP-BC 06/08/2019, 2:37 PMPatient ID: [name], male   DOB: [birth year], 59 y.o.   MRN: [id number]

## 2019-06-08 NOTE — Progress Notes (Signed)
D;  Patient's self inventory sheet, patient sleeps good, sleep medication helpful.  Good appetite, normal energy level, good concentration.  Rated depression 3, denied hopeless, rated anxiety 2.  Denied withdrawals.  Denied SI.  Physical problems, pain, Knees and legs, worst pain 8.5.  Pain medication helpful.  Goal is good BP.  Plans to exercise.  No discharge plans. A:  Medications administered per MD orders.  Emotional support and encouragement given patient. R:  Denied SI and HI, contracts for safety.  Denied A/V hallucinations.  Safety maintained with 15 minute checks.

## 2019-06-09 MED ORDER — DULOXETINE HCL 20 MG PO CPEP: 20.0000 mg | ORAL_CAPSULE | Freq: Every day | ORAL | 0 refills | Status: DC

## 2019-06-09 MED ORDER — PANTOPRAZOLE SODIUM 40 MG PO TBEC: 40.0000 mg | DELAYED_RELEASE_TABLET | Freq: Every day | ORAL | 0 refills | Status: DC

## 2019-06-09 MED ORDER — RISPERIDONE 2 MG PO TABS: 2.0000 mg | ORAL_TABLET | Freq: Every day | ORAL | 0 refills | Status: DC

## 2019-06-09 MED ORDER — METOPROLOL TARTRATE 25 MG PO TABS: 12.5000 mg | ORAL_TABLET | Freq: Two times a day (BID) | ORAL | 0 refills | Status: DC

## 2019-06-09 MED ORDER — GABAPENTIN 400 MG PO CAPS: 800.0000 mg | ORAL_CAPSULE | Freq: Three times a day (TID) | ORAL | 0 refills | Status: DC

## 2019-06-09 MED ORDER — ADULT MULTIVITAMIN W/MINERALS CH: 1.0000 | ORAL_TABLET | Freq: Every day | ORAL | Status: DC

## 2019-06-09 NOTE — Progress Notes (Signed)
Recreation Therapy Notes  Date:  3.29.21 Time: 0930 Location: 300 Hall Group Room  Group Topic: Stress Management  Goal Area(s) Addresses:  Patient will identify positive stress management techniques. Patient will identify benefits of using stress management post d/c.  Behavioral Response: Engaged  Intervention: Stress Management  Activity : Meditation.  LRT played a meditation that focused on letting go of the past and living in the moment.  Patients were to listen and follow along as meditation played to engage in activity.  Education:  Stress Management, Discharge Planning.   Education Outcome: Acknowledges Education  Clinical Observations/Feedback: Pt attended and participated in activity.    Victorino Sparrow, LRT/CTRS         Ria Comment, Trejon Duford A 06/09/2019 10:59 AM

## 2019-06-09 NOTE — Progress Notes (Signed)
Discharge Note:  Patient discharged home.  Patient denied SI and HI.  Denied A/V hallucinations.  Suicide prevention information given and discussed with the patient, who stated he understood and had no questions.  Patient stated he received all his belongings, clothing, toiletries, misc items, etc. Patient stated he appreciated all assistance received from [facility] staff.  All required discharge information given to patient at discharge.

## 2019-06-09 NOTE — Progress Notes (Signed)
D:  Patient's self inventory sheet, patient stated she has fair sleep, sleep medication helpful.  Good appetite, low energy level, good concentration.  Rated depression and anxiety 3, denied hopeless.  Denied withdrawals.  Physical problems, knees, worst pain  #8.5 in past 24 hours.  Pain medicine is helpful.  Goal is to find placement.  Plans to talk to SW.  Does have discharge plans. A:  Medications administered per MD orders.  Emotional support and encouragement given patient. R:  Denied SI and HI, contracts for safety.  Denied A/V hallucinations.  Safety maintained with 15 minute checks.                                                                                                                             ;

## 2019-06-09 NOTE — BHH Suicide Risk Assessment (Signed)
[facility] Discharge Suicide Risk Assessment   Principal Problem: Delusional disorder [facility]) Discharge Diagnoses: Principal Problem:   Delusional disorder ([place]) Active Problems:   Alcohol use disorder, severe, dependence ([place])   Acute psychosis ([place])   Total Time spent with patient: 15 minutes  Musculoskeletal: Strength & Muscle Tone: within normal limits Gait & Station: normal Patient leans: N/A  Psychiatric Specialty Exam: Review of Systems  All other systems reviewed and are negative.   Blood pressure 116/79, pulse 87, temperature 97.8 F (36.6 C), temperature source Oral, resp. rate 16, height 6\' 2"  (1.88 m), weight 78 kg, SpO2 100 %.Body mass index is 22.08 kg/m.  General Appearance: Casual  Eye Contact::  Good  Speech:  Normal Rate409  Volume:  Normal  Mood:  Euthymic  Affect:  Congruent  Thought Process:  Coherent and Descriptions of Associations: Circumstantial  Orientation:  Full (Time, Place, and Person)  Thought Content:  Logical  Suicidal Thoughts:  No  Homicidal Thoughts:  No  Memory:  Immediate;   Good Recent;   Good Remote;   Good  Judgement:  Intact  Insight:  Lacking  Psychomotor Activity:  Normal  Concentration:  Good  Recall:  Good  Fund of Knowledge:Good  Language: Good  Akathisia:  Negative  Handed:  Right  AIMS (if indicated):     Assets:  Desire for Improvement Resilience  Sleep:  Number of Hours: 5  Cognition: WNL  ADL's:  Intact   Mental Status Per Nursing Assessment::   On Admission:  NA  Demographic Factors:  Male, Divorced or widowed, Low socioeconomic status, Living alone and Unemployed  Loss Factors: Financial problems/change in socioeconomic status  Historical Factors: Impulsivity  Risk Reduction Factors:   Positive coping skills or problem solving skills  Continued Clinical Symptoms:  Depression:   Comorbid alcohol abuse/dependence Impulsivity Alcohol/Substance Abuse/Dependencies  Cognitive Features That Contribute To  Risk:  None    Suicide Risk:  Minimal: No identifiable suicidal ideation.  Patients presenting with no risk factors but with morbid ruminations; may be classified as minimal risk based on the severity of the depressive symptoms  Follow-up Information    [facility] Follow up.   Contact information: [place] [address] [phone]           Plan Of Care/Follow-up recommendations:  Activity:  ad lib  Sharma Covert, MD 06/09/2019, 7:44 AM
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
## 2019-06-09 NOTE — Progress Notes (Signed)
  [facility] Adult Case Management Discharge Plan :  Will you be returning to the same living situation after discharge:  No.  Going to a motel. At discharge, do you have transportation home?: Yes,  [facility] [employer]. Do you have the ability to pay for your medications: Yes,  [facility] Medicare.  Release of information consent forms completed and in the chart;  Patient's signature needed at discharge.  Patient to Follow up at: Follow-up Information    [place] on 06/13/2019.   Why: You have an appointment on 06/13/19 at 2:30 pm.  This appointment will be held in person.  Please bring your insurance information and your discharge summary. Contact information: [place] [address] [phone]           Next level of care provider has access to [place] and Suicide Prevention discussed: Yes,  with patient.]  Have you used any form of tobacco in the last 30 days? (Cigarettes, Smokeless Tobacco, Cigars, and/or Pipes): No  Has patient been referred to the Quitline?: N/A patient is not a smoker  Patient has been referred for addiction treatment: Yes  Joellen Jersey, [place] 06/09/2019, 2:27 PM

## 2019-06-09 NOTE — [facility] Group Notes (Signed)
LCSW Group Therapy Note   06/09/2019 2:44 PM  Type of Therapy and Topic:  Group Therapy:  Overcoming Obstacles   Participation Level:  Active   Description of Group:    In this group patients will be encouraged to explore what they see as obstacles to their own wellness and recovery. They will be guided to discuss their thoughts, feelings, and behaviors related to these obstacles. The group will process together ways to cope with barriers, with attention given to specific choices patients can make. Each patient will be challenged to identify changes they are motivated to make in order to overcome their obstacles. This group will be process-oriented, with patients participating in exploration of their own experiences as well as giving and receiving support and challenge from other group members.   Therapeutic Goals: 1. Patient will identify personal and current obstacles as they relate to admission. 2. Patient will identify barriers that currently interfere with their wellness or overcoming obstacles.  3. Patient will identify feelings, thought process and behaviors related to these barriers. 4. Patient will identify two changes they are willing to make to overcome these obstacles:      Summary of Patient Progress Patient was present in group.  Patient was distracting to others and required a lot of redirection to remain on group topic. Several times CSW had to ask the patient to allow others to speak.  Patient was able to return to topic with redirections, though it was short-lived.     Therapeutic Modalities:   Cognitive Behavioral Therapy Solution Focused Therapy Motivational Interviewing Relapse Prevention Therapy  Assunta Curtis, MSW, LCSW 06/09/2019 2:44 PM

## 2019-09-27 ENCOUNTER — Encounter (HOSPITAL_COMMUNITY): Payer: Self-pay | Admitting: Emergency Medicine

## 2019-09-27 ENCOUNTER — Emergency Department (HOSPITAL_COMMUNITY): Payer: Medicare Other

## 2019-09-27 ENCOUNTER — Emergency Department (HOSPITAL_COMMUNITY)
Admission: EM | Admit: 2019-09-27 | Discharge: 2019-09-27 | Disposition: A | Discharge status: Home / Self Care | Payer: Medicare Other | Attending: Emergency Medicine | Admitting: Emergency Medicine

## 2019-09-27 ENCOUNTER — Other Ambulatory Visit: Payer: Self-pay

## 2019-09-27 DIAGNOSIS — S4992XA Unspecified injury of left shoulder and upper arm, initial encounter: Secondary | ICD-10-CM | POA: Diagnosis present

## 2019-09-27 DIAGNOSIS — Y999 Unspecified external cause status: Secondary | ICD-10-CM | POA: Diagnosis not present

## 2019-09-27 DIAGNOSIS — F333 Major depressive disorder, recurrent, severe with psychotic symptoms: Secondary | ICD-10-CM | POA: Insufficient documentation

## 2019-09-27 DIAGNOSIS — Z86010 Personal history of colonic polyps: Secondary | ICD-10-CM | POA: Insufficient documentation

## 2019-09-27 DIAGNOSIS — F10251 Alcohol dependence with alcohol-induced psychotic disorder with hallucinations: Secondary | ICD-10-CM | POA: Diagnosis not present

## 2019-09-27 DIAGNOSIS — Z87891 Personal history of nicotine dependence: Secondary | ICD-10-CM | POA: Insufficient documentation

## 2019-09-27 DIAGNOSIS — R440 Auditory hallucinations: Secondary | ICD-10-CM

## 2019-09-27 DIAGNOSIS — Z7901 Long term (current) use of anticoagulants: Secondary | ICD-10-CM | POA: Insufficient documentation

## 2019-09-27 DIAGNOSIS — Z79899 Other long term (current) drug therapy: Secondary | ICD-10-CM | POA: Insufficient documentation

## 2019-09-27 DIAGNOSIS — Y92003 Bedroom of unspecified non-institutional (private) residence as the place of occurrence of the external cause: Secondary | ICD-10-CM | POA: Diagnosis not present

## 2019-09-27 DIAGNOSIS — Y906 Blood alcohol level of 120-199 mg/100 ml: Secondary | ICD-10-CM | POA: Diagnosis not present

## 2019-09-27 DIAGNOSIS — M1990 Unspecified osteoarthritis, unspecified site: Secondary | ICD-10-CM | POA: Insufficient documentation

## 2019-09-27 DIAGNOSIS — S42213A Unspecified displaced fracture of surgical neck of unspecified humerus, initial encounter for closed fracture: Secondary | ICD-10-CM | POA: Diagnosis not present

## 2019-09-27 DIAGNOSIS — F419 Anxiety disorder, unspecified: Secondary | ICD-10-CM | POA: Insufficient documentation

## 2019-09-27 DIAGNOSIS — F10220 Alcohol dependence with intoxication, uncomplicated: Secondary | ICD-10-CM | POA: Insufficient documentation

## 2019-09-27 DIAGNOSIS — Y939 Activity, unspecified: Secondary | ICD-10-CM | POA: Insufficient documentation

## 2019-09-27 DIAGNOSIS — I10 Essential (primary) hypertension: Secondary | ICD-10-CM | POA: Diagnosis not present

## 2019-09-27 DIAGNOSIS — Z7982 Long term (current) use of aspirin: Secondary | ICD-10-CM | POA: Insufficient documentation

## 2019-09-27 DIAGNOSIS — K219 Gastro-esophageal reflux disease without esophagitis: Secondary | ICD-10-CM | POA: Insufficient documentation

## 2019-09-27 DIAGNOSIS — W06XXXA Fall from bed, initial encounter: Secondary | ICD-10-CM | POA: Diagnosis not present

## 2019-09-27 DIAGNOSIS — Z8249 Family history of ischemic heart disease and other diseases of the circulatory system: Secondary | ICD-10-CM | POA: Diagnosis not present

## 2019-09-27 DIAGNOSIS — Z20822 Contact with and (suspected) exposure to covid-19: Secondary | ICD-10-CM | POA: Insufficient documentation

## 2019-09-27 DIAGNOSIS — E7800 Pure hypercholesterolemia, unspecified: Secondary | ICD-10-CM | POA: Insufficient documentation

## 2019-09-27 DIAGNOSIS — F1020 Alcohol dependence, uncomplicated: Secondary | ICD-10-CM | POA: Diagnosis not present

## 2019-09-27 DIAGNOSIS — Z8673 Personal history of transient ischemic attack (TIA), and cerebral infarction without residual deficits: Secondary | ICD-10-CM | POA: Insufficient documentation
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
## 2019-09-27 NOTE — ED Notes (Signed)
Pt transported to xray 

## 2019-09-27 NOTE — ED Provider Notes (Signed)
[facility] EMERGENCY DEPARTMENT Provider Note   CSN: [id number] Arrival date & time: 09/27/19  1033     History Chief Complaint  Patient presents with  . Arm Injury    Tristan Cook is a 59 y.o. male.  The history is provided by the patient and medical records. No language interpreter was used.  Arm Injury Associated symptoms: no fever      59 year old male with history of substance abuse, chronic back pain, anxiety, hypertension, neuropathy, presenting for evaluation of prior left arm injury.  Patient states several months past he fell off the bed and broke his left arm.  He report being seen and managed by orthopedist for his injury and subsequently had surgery on it.  He mention he needs additional surgery but decided against it.  He has been lost to follow-up but today he would like to have additional surgery of his left arm because he is unable to use his arm since the injury.  He denies any worsening pain denies any new numbness and denies any additional new injury.  He mention been seen and manage through [facility] orthopedic.  No other complaint.  No worsening pain.  Past Medical History:  Diagnosis Date  . Abdominal pain   . Adenomatous colon polyp 2008  . Anxiety   . Arthritis   . Back pain   . Bilateral lumbar radiculopathy   . Blurred vision, bilateral   . Chronic back pain   . Constipation   . Depression   . GERD (gastroesophageal reflux disease)   . Gout   . Hemorrhoids   . High cholesterol   . Hypertension   . Neck pain   . Neuropathy   . Neuropathy of both feet   . Numbness    rectal  . Numbness of legs   . Stroke ([place]) 2019  . Substance abuse ([place])    h/o excessive alcohol use; pt says he limits use to one to 2 beers daily he currently    Patient Active Problem List   Diagnosis Date Noted  . Acute psychosis ([place]) 06/05/2019  . Delusional disorder ([place]) 05/24/2019  . Swelling of limb 03/11/2019  . Peripheral  polyneuropathy 02/12/2019  . Alcohol intoxication ([place]) 08/27/2018  . Hallucinations 08/27/2018  . 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 Alcohol use disorder, severe, dependence ([place]) 08/27/2018  . TIA (transient ischemic attack) 05/29/2018  . Syncope 05/29/2018  . Alcohol abuse 05/29/2018  . Fall 05/29/2018  . Dislocation of left shoulder joint 05/29/2018  . Closed fracture of left proximal humerus 05/29/2018  . Abnormal LFTs 05/29/2018  . Hypokalemia 05/29/2018  . Constipation 10/17/2017  . Hoarseness 10/17/2017  . Weakness 04/11/2017  . Weakness of both legs 04/10/2017  . Vitamin B deficiency 01/22/2017  . 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 Stroke, small vessel ([place]) 01/17/2017  . Alcoholic peripheral neuropathy ([place]) 01/17/2017  . Vitamin B12 deficiency neuropathy ([place]) 01/17/2017  . Adjustment disorder with depressed mood 08/15/2016  . History of colonic polyps 06/14/2016  . Elevated blood pressure reading 06/14/2016  . Hemorrhoids 09/20/2015  . Depressive disorder 08/03/2015  . Back pain with radiation 06/15/2015  . Insomnia 06/03/2015  . Hyperlipidemia LDL goal <130 04/25/2015  . 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 Fatigue 03/29/2015  . Allergic rhinitis 02/10/2015  . Esophageal stricture 10/22/2014  . Overweight (BMI 25.0-29.9) 10/06/2013  . GERD (gastroesophageal reflux disease) 04/23/2012  . Hemorrhoids, internal 11/02/2011  .  Essential hypertension 07/25/2007  . Neck pain on left side 07/25/2007    Past Surgical History:  Procedure Laterality Date  . CATARACT EXTRACTION W/PHACO Right 08/16/2018   Procedure: CATARACT EXTRACTION PHACO AND INTRAOCULAR LENS PLACEMENT RIGHT EYE;  Surgeon: Baruch Goldmann, MD;  Location: [facility] ORS;  Service: Ophthalmology;  Laterality: Right;  CDE: 4.18  . COLONOSCOPY  10/09/2006   3 mm pedunculated sigmoid colon polyp removed/8-mm sessile hepatic flexure polyp (tubular villous adenoma)  removed/6-mm  descending colon polyp removed/small internal hemorrhoids  . COLONOSCOPY  02/28/2011   YQI:HKVQQV, multiple in the rectum/Internal hemorrhoids, MODERATE-CAUSING RECTAL BLEEDING  . COLONOSCOPY N/A 07/05/2016   Procedure: COLONOSCOPY;  Surgeon: [name], MD;  Location: [facility] ENDO SUITE;  Service: Endoscopy;  Laterality: N/A;  11:15am  . 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 Mild erosive gastrtitis most likely due to indomethacin   . FINGER SURGERY Right    4th and 5th digit  . FLEXIBLE SIGMOIDOSCOPY  01/02/2012   SLF: 1. the colonic mucosa appeared normal in the sigmoid colon 2. Large internal hemorrhoids: cause for rectal bleeding/pain . s/p banding X 3   . HAND SURGERY    . REVERSE SHOULDER ARTHROPLASTY Left 05/29/2018   Procedure: REVERSE SHOULDER ARTHROPLASTY;  Surgeon: Hiram Gash, MD;  Location: [place];  Service: Orthopedics;  Laterality: Left;  . SAVORY DILATION N/A 07/21/2014   Procedure: SAVORY DILATION;  Surgeon: [name], MD;  Location: [facility];  Service: Endoscopy;  Laterality: N/A;       Family History  Problem Relation Age of Onset  . Hypertension Mother   . Hypertension Father   . Stroke Father   . Hypertension Sister   . Hypertension Brother   . Cancer Brother   . Cancer Brother        throat cancer  . Hypertension Brother   . Diabetes Maternal Grandmother   . Colon cancer Neg Hx     Social History   Tobacco Use  . Smoking status: Former Smoker    Quit date: 08/16/2004    Years since quitting: 15.1  . Smokeless tobacco: Current User  . Tobacco comment: no tobacco, has quit long time ago  Vaping Use  . Vaping Use: Never used  Substance Use Topics  . Alcohol use: Yes    Alcohol/week: 6.0 standard drinks    Types: 6 Cans of beer per week    Comment: six pack beer daily  . Drug use: No    Home Medications Prior to Admission medications   Medication Sig Start Date  End Date Taking? Authorizing Provider  aspirin EC 81 MG tablet Take 1 tablet (81 mg total) by mouth daily. 09/04/18   Clapacs, Madie Reno, MD  atorvastatin (LIPITOR) 20 MG tablet Take 20 mg by mouth daily.     [provider]  DULoxetine (CYMBALTA) 20 MG capsule Take 1 capsule (20 mg total) by mouth daily. 06/10/19   Connye Burkitt, NP  furosemide (LASIX) 40 MG tablet Take 40 mg by mouth daily.     [provider]  gabapentin (NEURONTIN) 400 MG capsule Take 2 capsules (800 mg total) by mouth 3 (three) times daily. 06/09/19   Connye Burkitt, NP  metoprolol tartrate (LOPRESSOR) 25 MG tablet Take 0.5 tablets (12.5 mg total) by mouth 2 (two) times daily. 06/09/19   Connye Burkitt, NP  Multiple Vitamin (MULTIVITAMIN WITH MINERALS) TABS tablet Take 1 tablet by mouth daily.  06/10/19   Connye Burkitt, NP  pantoprazole (PROTONIX) 40 MG tablet Take 1 tablet (40 mg total) by mouth daily. 06/10/19   Connye Burkitt, NP  risperiDONE (RISPERDAL) 2 MG tablet Take 1 tablet (2 mg total) by mouth at bedtime. 06/09/19   Connye Burkitt, NP    Allergies    Hydrocodone, Benadryl [diphenhydramine hcl (sleep)], and Hydrocortisone  Review of Systems   Review of Systems  Constitutional: Negative for fever.  Musculoskeletal: Positive for arthralgias.  Neurological: Negative for numbness.  All other systems reviewed and are negative.   Physical Exam Updated Vital Signs BP (!) 137/102 (BP Location: Right Arm)   Pulse (!) 126   Temp 98.7 F (37.1 C)   Resp 18   Ht 6\' 2"  (1.88 m)   Wt 81.6 kg   SpO2 100%   BMI 23.11 kg/m   Physical Exam Vitals and nursing note reviewed.  Constitutional:      General: He is not in acute distress.    Appearance: He is well-developed.  HENT:     Head: Atraumatic.  Eyes:     Conjunctiva/sclera: Conjunctivae normal.  Musculoskeletal:     Cervical back: Neck supple.     Comments: Left arm: Patient unable to perform range of motion throughout his left shoulder and  arm.  He is unable to lift up his arm requiring support from the other hand.  He has a normal-appearing/scar to the anterior shoulder.  He does not have any significant tenderness to palpation of his arm.  Radial pulses 2+, normal grip strength.  Able to flex and extend at the elbow.  Skin:    Findings: No rash.  Neurological:     Mental Status: He is alert.     ED Results / Procedures / Treatments   Labs (all labs ordered are listed, but only abnormal results are displayed) Labs Reviewed - No data to display  EKG None  Radiology DG Humerus Left  Result Date: 09/27/2019 CLINICAL DATA:  Left arm pain after injury 5 months ago. EXAM: LEFT HUMERUS - 2+ VIEW COMPARISON:  December 01, 2018. FINDINGS: Status post left total shoulder arthroplasty. Old healed moderately angulated fracture is seen involving the midshaft of the left humerus around the tip of the humeral prosthesis. However, the main shaft component of the humeral prosthesis has broken off from its articular component with associated moderately displaced proximal left humeral neck fracture. IMPRESSION: Moderately displaced proximal left humeral neck fracture is noted with fracture of the humeral component of the left shoulder arthroplasty. Electronically Signed   By: Marijo Conception M.D.   On: 09/27/2019 12:40    Procedures Procedures (including critical care time)  Medications Ordered in ED Medications - No data to display  ED Course  I have reviewed the triage vital signs and the nursing notes.  Pertinent labs & imaging results that were available during my care of the patient were reviewed by me and considered in my medical decision making (see chart for details).    MDM Rules/Calculators/A&P                          BP (!) 146/106   Pulse (!) 120   Temp 98.7 F (37.1 C)   Resp 18   Ht 6\' 2"  (1.88 m)   Wt 81.6 kg   SpO2 99%   BMI 23.11 kg/m   Final Clinical Impression(s) / ED Diagnoses Final diagnoses:  Closed displaced fracture of surgical neck of humerus, unspecified fracture morphology, initial encounter    Rx / DC Orders ED Discharge Orders    None     11:30 AM Patient with left arm injury from a fall several months ago that would require surgical repair but he refused to have a repair at that time.  At this time he requests to have surgery of his arm so he can use it.  He is right-hand dominant.  He does not have any increasing pain or any new injury.  Will repeat left humerus x-ray and will provide sling for support, and will refer to orthopedic for further management.   Domenic Moras, PA-C 09/27/19 1257    Malvin Johns, MD 09/27/19 1535

## 2019-09-27 NOTE — Discharge Instructions (Signed)
Please follow up with Dr. Percell Miller for further management of your arm injury.

## 2019-09-28 ENCOUNTER — Encounter (HOSPITAL_COMMUNITY): Payer: Self-pay | Admitting: Emergency Medicine

## 2019-09-28 ENCOUNTER — Other Ambulatory Visit: Payer: Self-pay

## 2019-09-28 ENCOUNTER — Ambulatory Visit (INDEPENDENT_AMBULATORY_CARE_PROVIDER_SITE_OTHER)
Admission: EM | Admit: 2019-09-28 | Discharge: 2019-09-28 | Disposition: A | Discharge status: Home / Self Care | Payer: Medicare Other | Source: Home / Self Care

## 2019-09-28 DIAGNOSIS — F1020 Alcohol dependence, uncomplicated: Secondary | ICD-10-CM

## 2019-09-28 DIAGNOSIS — S42213A Unspecified displaced fracture of surgical neck of unspecified humerus, initial encounter for closed fracture: Secondary | ICD-10-CM | POA: Diagnosis not present

## 2019-09-28 DIAGNOSIS — R440 Auditory hallucinations: Secondary | ICD-10-CM

## 2019-09-28 LAB — CBC WITH DIFFERENTIAL/PLATELET
Abs Immature Granulocytes: 0.08 10*3/uL — ABNORMAL HIGH (ref 0.00–0.07)
Basophils Absolute: 0 10*3/uL (ref 0.0–0.1)
Basophils Relative: 0 %
Eosinophils Absolute: 0 10*3/uL (ref 0.0–0.5)
Eosinophils Relative: 0 %
HCT: 43.2 % (ref 39.0–52.0)
Hemoglobin: 14.7 g/dL (ref 13.0–17.0)
Immature Granulocytes: 1 %
Lymphocytes Relative: 14 %
Lymphs Abs: 1.5 10*3/uL (ref 0.7–4.0)
MCH: 32.5 pg (ref 26.0–34.0)
MCHC: 34 g/dL (ref 30.0–36.0)
MCV: 95.4 fL (ref 80.0–100.0)
Monocytes Absolute: 1.7 10*3/uL — ABNORMAL HIGH (ref 0.1–1.0)
Monocytes Relative: 15 %
Neutro Abs: 7.6 10*3/uL (ref 1.7–7.7)
Neutrophils Relative %: 70 %
Platelets: 325 10*3/uL (ref 150–400)
RBC: 4.53 MIL/uL (ref 4.22–5.81)
RDW: 13.6 % (ref 11.5–15.5)
WBC: 11 10*3/uL — ABNORMAL HIGH (ref 4.0–10.5)
nRBC: 0 % (ref 0.0–0.2)

## 2019-09-28 LAB — COMPREHENSIVE METABOLIC PANEL
ALT: 37 U/L (ref 0–44)
AST: 59 U/L — ABNORMAL HIGH (ref 15–41)
Albumin: 4.1 g/dL (ref 3.5–5.0)
Alkaline Phosphatase: 68 U/L (ref 38–126)
Anion gap: 20 — ABNORMAL HIGH (ref 5–15)
BUN: 19 mg/dL (ref 6–20)
CO2: 20 mmol/L — ABNORMAL LOW (ref 22–32)
Calcium: 9.6 mg/dL (ref 8.9–10.3)
Chloride: 94 mmol/L — ABNORMAL LOW (ref 98–111)
Creatinine, Ser: 1.35 mg/dL — ABNORMAL HIGH (ref 0.61–1.24)
GFR calc Af Amer: >60 mL/min (ref >60)
GFR calc non Af Amer: 58 mL/min — ABNORMAL LOW (ref >60)
Glucose, Bld: 72 mg/dL (ref 70–99)
Potassium: 3.2 mmol/L — ABNORMAL LOW (ref 3.5–5.1)
Sodium: 134 mmol/L — ABNORMAL LOW (ref 135–145)
Total Bilirubin: 1.3 mg/dL — ABNORMAL HIGH (ref 0.3–1.2)
Total Protein: 8.6 g/dL — ABNORMAL HIGH (ref 6.5–8.1)

## 2019-09-28 LAB — POCT URINE DRUG SCREEN - MANUAL ENTRY (I-SCREEN)
POC Amphetamine UR: NOT DETECTED
POC Buprenorphine (BUP): NOT DETECTED
POC Cocaine UR: NOT DETECTED
POC Marijuana UR: NOT DETECTED
POC Methadone UR: NOT DETECTED
POC Methamphetamine UR: NOT DETECTED
POC Morphine: NOT DETECTED
POC Oxazepam (BZO): NOT DETECTED
POC Oxycodone UR: NOT DETECTED
POC Secobarbital (BAR): NOT DETECTED

## 2019-09-28 LAB — ETHANOL: Alcohol, Ethyl (B): 162 mg/dL — ABNORMAL HIGH (ref <10)

## 2019-09-28 LAB — POC SARS CORONAVIRUS 2 AG: SARS Coronavirus 2 Ag: NEGATIVE

## 2019-09-28 MED ORDER — HYDROXYZINE HCL 25 MG PO TABS: 25.0000 mg | ORAL_TABLET | Freq: Four times a day (QID) | ORAL | Status: DC | PRN

## 2019-09-28 MED ORDER — GABAPENTIN 400 MG PO CAPS
800.0000 mg | ORAL_CAPSULE | Freq: Three times a day (TID) | ORAL | Status: DC
  Administered 2019-09-28: 800 mg via ORAL
  Filled 2019-09-28: qty 2

## 2019-09-28 MED ORDER — ACETAMINOPHEN 325 MG PO TABS: 650.0000 mg | ORAL_TABLET | Freq: Four times a day (QID) | ORAL | Status: DC | PRN

## 2019-09-28 MED ORDER — ASPIRIN EC 81 MG PO TBEC
81.0000 mg | DELAYED_RELEASE_TABLET | Freq: Every day | ORAL | Status: DC
  Administered 2019-09-28: 81 mg via ORAL
  Filled 2019-09-28: qty 1

## 2019-09-28 MED ORDER — ATORVASTATIN CALCIUM 10 MG PO TABS
20.0000 mg | ORAL_TABLET | Freq: Every day | ORAL | Status: DC
  Administered 2019-09-28: 20 mg via ORAL
  Filled 2019-09-28: qty 2

## 2019-09-28 MED ORDER — FUROSEMIDE 40 MG PO TABS: 40.0000 mg | ORAL_TABLET | Freq: Every day | ORAL | Status: DC

## 2019-09-28 MED ORDER — MAGNESIUM HYDROXIDE 400 MG/5ML PO SUSP: 30.0000 mL | Freq: Every day | ORAL | Status: DC | PRN

## 2019-09-28 MED ORDER — PANTOPRAZOLE SODIUM 20 MG PO TBEC
40.0000 mg | DELAYED_RELEASE_TABLET | Freq: Every day | ORAL | Status: DC
  Administered 2019-09-28: 40 mg via ORAL
  Filled 2019-09-28: qty 2

## 2019-09-28 MED ORDER — LOPERAMIDE HCL 2 MG PO CAPS: 2.0000 mg | ORAL_CAPSULE | ORAL | Status: DC | PRN

## 2019-09-28 MED ORDER — ONDANSETRON 4 MG PO TBDP: 4.0000 mg | ORAL_TABLET | Freq: Four times a day (QID) | ORAL | Status: DC | PRN

## 2019-09-28 MED ORDER — METOPROLOL TARTRATE 25 MG PO TABS
12.5000 mg | ORAL_TABLET | Freq: Two times a day (BID) | ORAL | Status: DC
  Administered 2019-09-28 (×2): 12.5 mg via ORAL
  Filled 2019-09-28 (×2): qty 1

## 2019-09-28 MED ORDER — ADULT MULTIVITAMIN W/MINERALS CH
1.0000 | ORAL_TABLET | Freq: Every day | ORAL | Status: DC
  Administered 2019-09-28: 1 via ORAL
  Filled 2019-09-28: qty 1

## 2019-09-28 MED ORDER — RISPERIDONE 2 MG PO TABS
2.0000 mg | ORAL_TABLET | Freq: Every day | ORAL | Status: DC
  Administered 2019-09-28: 2 mg via ORAL
  Filled 2019-09-28: qty 1

## 2019-09-28 MED ORDER — ALUM & MAG HYDROXIDE-SIMETH 200-200-20 MG/5ML PO SUSP: 30.0000 mL | ORAL | Status: DC | PRN

## 2019-09-28 MED ORDER — LORAZEPAM 1 MG PO TABS: 1.0000 mg | ORAL_TABLET | Freq: Four times a day (QID) | ORAL | Status: DC | PRN

## 2019-09-28 NOTE — BH Assessment (Signed)
Comprehensive Clinical Assessment (CCA) Note  09/28/2019 Tristan Cook [id number] Patient said that he contacted police tonight and told them that he has been hearing voices.  Police contacted EMS who brought patient to [facility].  Patient says that he does not want to harm himself nor anyone else.  He said that he does not want that misunderstood because he "does not want to have someone think that I want to harm them."  He wanted to make sure that it was understood that the voices are saying they are going to harm him.  Paitent denies any SI or HI.  He is not having any visual hallucinations.  Patient says he drinks 2-3 forty oz beers a day.  He had two beers earlier yesterday (07/17).  He denies other drug use.  Patient is mildly irritable.  He is homeless and has to walk everywhere.  He has good eye contact and is oriented.  Pt is reporting hearing voices but is not actively responding to internal stimuli.  Pt not engaged in delusional behavior.  Patient did not report any problems with sleep or appetite.  Patient has no current outpatient care.  He was at [facility] March 25-29, '21.    -Clinician discussed patient with Lindon Romp, FNP.  Pt will be continuously assessed at [facility] overnight.  Visit Diagnosis:   No diagnosis found.    CCA Screening, Triage and Referral (STR)  Patient Reported Information How did you hear about [place]? Self (Pt contacted police, they sent a EMS unit to him.)  Referral name: No data recorded Referral phone number: No data recorded  Whom do you see for routine medical problems? Other (Comment) ([facility] in [place])  Practice/Facility Name: No data recorded Practice/Facility Phone Number: No data recorded Name of Contact: No data recorded Contact Number: No data recorded Contact Fax Number: No data recorded Prescriber Name: No data recorded Prescriber Address (if known): No data recorded  What Is the Reason for Your Visit/Call Today? Pt hearing voices  telling him they are going to harm him.  How Long Has This Been Causing You Problems? 1 wk - 1 month  What Do You Feel Would Help You the Most Today? Assessment Only   Have You Recently Been in Any Inpatient Treatment (Hospital/Detox/Crisis Center/28-Day Program)? Yes  Name/Location of Program/Hospital:[facility] in 06/05/19  How Long Were You There? See Epic chart  When Were You Discharged? 06/09/19   Have You Ever Received Services From [employer] Before? Yes  Who Do You See at [facility]? [place]   Have You Recently Had Any Thoughts About Hurting Yourself? No  Are You Planning to Commit Suicide/Harm Yourself At This time? No   Have you Recently Had Thoughts About [place]? No  Explanation: No data recorded  Have You Used Any Alcohol or Drugs in the Past 24 Hours? Yes  How Long Ago Did You Use Drugs or Alcohol? 1700  What Did You Use and How Much? 40oz beers, two of them   Do You Currently Have a Therapist/Psychiatrist? No  Name of Therapist/Psychiatrist: No data recorded  Have You Been Recently Discharged From Any Office Practice or Programs? No  Explanation of Discharge From Practice/Program: No data recorded    CCA Screening Triage Referral Assessment Type of Contact: Face-to-Face  Is this Initial or Reassessment? No data recorded Date Telepsych consult ordered in [facility]:  No data recorded Time Telepsych consult ordered in [facility]:  No data recorded  Patient Reported Information Reviewed? Yes  Patient Left Without Being Seen? No data recorded Reason for Not Completing Assessment: No data recorded  Collateral Involvement: No data recorded  Does Patient Have a [place]? No data recorded Name and Contact of Legal Guardian: No data recorded If Minor and Not Living with Parent(s), Who has Custody? No data recorded Is CPS involved or ever been involved? No data recorded Is APS involved or ever been involved? No data recorded  Patient  Determined To Be At Risk for Harm To Self or Others Based on Review of Patient Reported Information or Presenting Complaint? No  Method: No data recorded Availability of Means: No data recorded Intent: No data recorded Notification Required: No data recorded Additional Information for Danger to Others Potential: No data recorded Additional Comments for Danger to Others Potential: No data recorded Are There Guns or Other Weapons in Your Home? No data recorded Types of Guns/Weapons: No data recorded Are These Weapons Safely Secured?                            No data recorded Who Could Verify You Are Able To Have These Secured: No data recorded Do You Have any Outstanding Charges, Pending Court Dates, Parole/Probation? No data recorded Contacted To Inform of Risk of Harm To Self or Others: No data recorded  Location of Assessment: [facility] Assessment Services   Does Patient Present under Involuntary Commitment? No  IVC Papers Initial File Date: No data recorded  [place] of Residence: [place]   Patient Currently Receiving the Following Services: Not Receiving Services   Determination of Need: Emergent (2 hours)   Options For Referral: Therapeutic Triage Services     CCA Biopsychosocial  Intake/Chief Complaint:  CCA Intake With Chief Complaint CCA Part Two Date: 09/28/19 CCA Part Two Time: 0040 Chief Complaint/Presenting Problem: Pt has been hearing voices telling him that they are going to harm him.  He hears these voices off and on.  Pt is currently homeless. Patient's Currently Reported Symptoms/Problems: Pt reports hearing voices. Type of Services Patient Feels Are Needed: Pt wants continuous assessment. Initial Clinical Notes/Concerns: Pt hearing voices saying they are going to harm him.  Mental Health Symptoms Depression:  Depression: Fatigue, Hopelessness  Mania:  Mania: None  Anxiety:   Anxiety: None  Psychosis:  Psychosis: Hallucinations (Hearing voices telling  him they are going to harm him.)  Trauma:     Obsessions:  Obsessions: None  Compulsions:  Compulsions: None  Inattention:  Inattention: None  Hyperactivity/Impulsivity:  Hyperactivity/Impulsivity: N/A  Oppositional/Defiant Behaviors:  Oppositional/Defiant Behaviors: N/A  Emotional Irregularity:  Emotional Irregularity: None  Other Mood/Personality Symptoms:      Mental Status Exam Appearance and self-care  Stature:  Stature: Tall  Weight:  Weight: Average weight  Clothing:  Clothing: Age-appropriate  Grooming:  Grooming: Normal  Cosmetic use:  Cosmetic Use: None  Posture/gait:  Posture/Gait: Other (Comment) (Using a walker.)  Motor activity:  Motor Activity: Not Remarkable  Sensorium  Attention:  Attention: Normal  Concentration:  Concentration: Normal  Orientation:  Orientation: Situation, Place, Person, Object, Time  Recall/memory:     Affect and Mood  Affect:  Affect: Labile  Mood:  Mood: Irritable  Relating  Eye contact:  Eye Contact: Normal  Facial expression:  Facial Expression: Tense  Attitude toward examiner:  Attitude Toward Examiner: Irritable  Thought and Language  Speech flow: Speech Flow: Normal  Thought content:  Thought Content: Appropriate to Mood and Circumstances  Preoccupation:  Preoccupations: None  Hallucinations:  Hallucinations: Auditory  Organization:     Transport planner of Knowledge:  Fund of Knowledge: Average  Intelligence:  Intelligence: Average  Abstraction:  Abstraction: Normal  Judgement:  Judgement: Normal  Reality Testing:  Pension scheme manager  Insight:  Insight: Fair  Decision Making:  Decision Making: Normal  Social Functioning  Social Maturity:     Social Judgement:  Social Judgement: "Games developer"  Stress  Stressors:  Stressors: Housing, Psychologist, clinical Ability:  Coping Ability: Deficient supports  Skill Deficits:  Skill Deficits: None  Supports:  Supports: Friends/Service system     Religion:     Leisure/Recreation:    Exercise/Diet:     CCA Employment/Education  Employment/Work Situation: Employment / Work Situation Employment situation: On disability Why is patient on disability: Pt reports "my wrist, knees, ankles". How long has patient been on disability: Pt reports "Oct 2019" Patient's job has been impacted by current illness: No What is the longest time patient has a held a job?: 5 years Where was the patient employed at that time?: [employer]  Education:     CCA Family/Childhood History  Family and Relationship History: Family history Marital status: Single Does patient have children?: Yes How many children?: 1  Childhood History:     Child/Adolescent Assessment:     CCA Substance Use  Alcohol/Drug Use: Alcohol / Drug Use Pain Medications: see MAR Prescriptions: see MAR Over the Counter: see MAR History of alcohol / drug use?: Yes Longest period of sobriety (when/how long): Unknown Negative Consequences of Use: Personal relationships Withdrawal Symptoms: Other (Comment) (Reports no withdrawal symptoms) Substance #1 Name of Substance 1: Beer 1 - Age of First Use: unknown 1 - Amount (size/oz): 2-3 40oz beers 1 - Frequency: Daily 1 - Duration: off and on 1 - Last Use / Amount: 07/17 two beers (40oz)                       ASAM's:  Six Dimensions of Multidimensional Assessment  Dimension 1:  Acute Intoxication and/or Withdrawal Potential:      Dimension 2:  Biomedical Conditions and Complications:      Dimension 3:  Emotional, Behavioral, or Cognitive Conditions and Complications:     Dimension 4:  Readiness to Change:     Dimension 5:  Relapse, Continued use, or Continued Problem Potential:     Dimension 6:  Recovery/Living Environment:     ASAM Severity Score:    ASAM Recommended Level of Treatment:     Substance use Disorder (SUD)    Recommendations for Services/Supports/Treatments:    DSM5 Diagnoses: Patient  Active Problem List   Diagnosis Date Noted  . Acute psychosis ([place]) 06/05/2019  . Delusional disorder ([place]) 05/24/2019  . Swelling of limb 03/11/2019  . Peripheral polyneuropathy 02/12/2019  . Alcohol intoxication ([place]) 08/27/2018  . Hallucinations 08/27/2018  . MDD (major depressive disorder), recurrent, severe, with psychosis ([place]) 08/27/2018  . Alcohol use disorder, severe, dependence ([place]) 08/27/2018  . TIA (transient ischemic attack) 05/29/2018  . Syncope 05/29/2018  . Alcohol abuse 05/29/2018  . Fall 05/29/2018  . Dislocation of left shoulder joint 05/29/2018  . Closed fracture of left proximal humerus 05/29/2018  . Abnormal LFTs 05/29/2018  . Hypokalemia 05/29/2018  . Constipation 10/17/2017  . Hoarseness 10/17/2017  . Weakness 04/11/2017  . Weakness of both legs 04/10/2017  . Vitamin B deficiency 01/22/2017  . 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 Stroke, small vessel (  [place]) 01/17/2017  . 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 Vitamin B12 deficiency neuropathy ([place]) 01/17/2017  . Adjustment disorder with depressed mood 08/15/2016  . History of colonic polyps 06/14/2016  . Elevated blood pressure reading 06/14/2016  . Hemorrhoids 09/20/2015  . Depressive disorder 08/03/2015  . Back pain with radiation 06/15/2015  . Insomnia 06/03/2015  . Hyperlipidemia LDL goal <130 04/25/2015  . 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 Fatigue 03/29/2015  . Allergic rhinitis 02/10/2015  . Esophageal stricture 10/22/2014  . Overweight (BMI 25.0-29.9) 10/06/2013  . GERD (gastroesophageal reflux disease) 04/23/2012  . Hemorrhoids, internal 11/02/2011  . Essential hypertension 07/25/2007  . Neck pain on left side 07/25/2007    Patient Centered Plan: Patient is on the following Treatment Plan(s):  Anxiety and Substance Abuse   Referrals to Alternative Service(s): Referred to Alternative Service(s):   Place:   Date:   Time:    Referred to Alternative Service(s):   Place:   Date:    Time:    Referred to Alternative Service(s):   Place:   Date:   Time:    Referred to Alternative Service(s):   Place:   Date:   Time:     Raymondo Band

## 2019-09-28 NOTE — ED Notes (Signed)
Patient watching TV.

## 2019-09-28 NOTE — ED Notes (Signed)
EMS transport pt presents with auditory hallucinations, hearing voices telling him to harm self.  Denies SI, HI or visual hallucinations.  Pt admits to drinking 2 40 oz beers and 1 can earlier today.  Denies street drugs.  Pt ambulates with assist of walker.  Pt irritable and cooperative.  Skin search completed, monitoring for safety, no distress noted.

## 2019-09-28 NOTE — ED Provider Notes (Addendum)
Behavioral Health Admission H&P [facility])  Date: 09/28/19 Patient Name: Tristan Cook MRN: [id number] Chief Complaint:  Chief Complaint  Patient presents with  . Alcohol Intoxication  . Hallucinations   Chief Complaint/Presenting Problem: Pt has been hearing voices telling him that they are going to harm him.  He hears these voices off and on.  Pt is currently homeless.  Diagnoses:  Final diagnoses:  Alcohol use disorder, severe, dependence ([place])  Auditory hallucinations    HPI: Tristan Cook is a 59 year old male who presents to be voluntarily with EMS with reports of auditory hallucinations.  He has a history of alcohol use disorder,  chronic back pain, hypertension, neuropathy, and arthritis.  He was inpatient at [facility] in March 2021 and treated for delusional disorder after making comments about having knowledge of a mass shooting of 187 people in [place] and being part of the witness protection program.  Patient reports that he is hearing voices that tell him they are going to harm him.  He denies that the voices are command in nature.  In an attempt to elicit delusional thought content, he was asked if he had any knowledge of murders in [place]. He states "I don't know anything about any murders." He reports that he drinks 3-40 ounce beers per day.  He reports last drink was yesterday.  He denies a history of withdrawal seizures and DTs.  He denies other substance use.  Patient is somewhat irritable during the assessment, however, he apologizes.  Patient reports that he is angry with his girlfriend because she was supposed to pick him up this evening, but she did not show up.    Patient has a closed left humerus fracture that occurred in 12/01/2018. Patient refused to have surgically repaired. He has Limited ROM of left shoulder/arm.  He presented to the emergency department on 09/27/2019 requesting to have surgery to repair the humerus fracture.   He was medically cleared and provided a referral to orthopedics.  On evaluation, patient is alert and oriented x4, calm, and cooperative.  His speech is clear and coherent, normal rate.  He is well-groomed.  He reports his mood is depressed.  Affect is congruent with mood.  He reports hearing voices that tell him they are going to harm him.  He denies visual hallucinations.  He does not appear to be responding to internal stimuli.  No evidence of delusional thought content at this time.  He denies suicidal ideations.  He denies homicidal ideations.  No evidence of withdrawal symptoms at this time.  No distal tremor noted.  PHQ 2-9:    Office Visit from 08/03/2015 in [facility] Office Visit from 05/29/2014 in [facility]  Thoughts that you would be better off dead, or of hurting yourself in some way Not at all Not at all  PHQ-9 Total Score 8 3        ED from 09/28/2019 in [facility] Admission (Discharged) from 06/05/2019 in [facility] Admission (Discharged) from 08/27/2018 in [facility] No Risk No Risk Moderate Risk       Total Time spent with patient: 45 minutes  Musculoskeletal  Strength & Muscle Tone: within normal limits Gait & Station: unsteady Patient leans: N/A  Psychiatric Specialty Exam  Presentation General Appearance: Appropriate for Environment;Well Groomed  Eye Contact:Fair  Speech:Clear and Coherent;Normal Rate  Speech Volume:Normal  Handedness:No data recorded  Mood and Affect  Mood:Depressed  Affect:Congruent;Depressed   Thought Process  Thought Processes:Coherent;Linear  Descriptions of Associations:Intact  Orientation:Full (Time, Place and Person)  Thought Content:Logical  Hallucinations:Hallucinations: Auditory Description of Auditory Hallucinations: reports that he hears voices that tel him that they are going to hurt  him.  Ideas of Reference:None  Suicidal Thoughts:Suicidal Thoughts: No  Homicidal Thoughts:Homicidal Thoughts: No   Sensorium  Memory:Immediate Good;Recent Good;Remote Good  Judgment:Fair  Insight:Fair   Executive Functions  Concentration:Good  Attention Span:Good  Recall:Good  Fund of Knowledge:Good  Language:Good   Psychomotor Activity  Psychomotor Activity:Psychomotor Activity: Normal   Assets  Assets:Communication Skills;Desire for Improvement;Physical Health;Resilience   Sleep  Sleep:Sleep: Fair   Physical Exam Vitals and nursing note reviewed.  Constitutional:      General: He is not in acute distress.    Appearance: He is not ill-appearing, toxic-appearing or diaphoretic.  HENT:     Head: Normocephalic.     Right Ear: External ear normal.     Left Ear: External ear normal.  Eyes:     Pupils: Pupils are equal, round, and reactive to light.  Cardiovascular:     Rate and Rhythm: Normal rate.  Pulmonary:     Effort: Pulmonary effort is normal. No respiratory distress.  Musculoskeletal:     Comments: Patient has a closed left humerus fracture that occurred several months prior, patient refused to have surgically repaired. Limited ROM of left shoulder/arm.   Skin:    General: Skin is warm and dry.  Neurological:     Mental Status: He is alert and oriented to person, place, and time.  Psychiatric:        Mood and Affect: Mood is depressed.        Speech: Speech normal.        Behavior: Behavior is cooperative.        Thought Content: Thought content is not paranoid or delusional. Thought content includes suicidal ideation. Thought content does not include homicidal ideation. Thought content does not include suicidal plan.    Review of Systems  Constitutional: Negative for chills, diaphoresis, fever, malaise/fatigue and weight loss.  Respiratory: Negative for cough and shortness of breath.   Cardiovascular: Negative for chest pain and leg  swelling.  Gastrointestinal: Negative for diarrhea, nausea and vomiting.  Musculoskeletal: Positive for joint pain.  Neurological: Negative for seizures.  Psychiatric/Behavioral: Positive for depression, hallucinations and substance abuse. Negative for memory loss and suicidal ideas. The patient is nervous/anxious and has insomnia.     Blood pressure (!) 129/91, pulse (!) 101, temperature 97.8 F (36.6 C), temperature source Tympanic, resp. rate 20, height 6\' 2"  (1.88 m), weight 77.1 kg, SpO2 100 %. Body mass index is 21.83 kg/m.  Past Psychiatric History: He was inpatient at [facility] in March 2021 and treated for delusional disorder after after making comments about having knowledge of a mass shooting of 187 people in [facility] and being part of the witness protection program. Patient reports prior hospitalization at [facility] for alcohol detox. Denies any history of DTs or seizures.  Says he is never been through any kind of substance abuse treatment or really ever tried to stop drinking in the past.   Is the patient at risk to self? No  Has the patient been a risk to self in the past 6 months? No .    Has the patient been a risk to self within the distant past? Yes   Is the patient a  risk to others? No   Has the patient been a risk to others in the past 6 months? No   Has the patient been a risk to others within the distant past? No   Past Medical History:  Past Medical History:  Diagnosis Date  . Abdominal pain   . Adenomatous colon polyp 2008  . Anxiety   . Arthritis   . Back pain   . Bilateral lumbar radiculopathy   . Blurred vision, bilateral   . Chronic back pain   . Constipation   . Depression   . GERD (gastroesophageal reflux disease)   . Gout   . Hemorrhoids   . High cholesterol   . Hypertension   . Neck pain   . Neuropathy   . Neuropathy of both feet   . Numbness    rectal  . Numbness of legs   . Stroke ([place]) 2019  .  Substance abuse ([place])    h/o excessive alcohol use; pt says he limits use to one to 2 beers daily he currently    Past Surgical History:  Procedure Laterality Date  . CATARACT EXTRACTION W/PHACO Right 08/16/2018   Procedure: CATARACT EXTRACTION PHACO AND INTRAOCULAR LENS PLACEMENT RIGHT EYE;  Surgeon: Baruch Goldmann, MD;  Location: [facility];  Service: Ophthalmology;  Laterality: Right;  CDE: 4.18  . COLONOSCOPY  10/09/2006   3 mm pedunculated sigmoid colon polyp removed/8-mm sessile hepatic flexure polyp (tubular villous adenoma) removed/6-mm  descending colon polyp removed/small internal hemorrhoids  . COLONOSCOPY  02/28/2011   XNA:TFTDDU, multiple in the rectum/Internal hemorrhoids, MODERATE-CAUSING RECTAL BLEEDING  . COLONOSCOPY N/A 07/05/2016   Procedure: COLONOSCOPY;  Surgeon: [name], MD;  Location: [facility] END[facility] SU[facility];  Service: Endoscopy;  Laterality: N/A;  11:15am  . 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 Mild erosive gastrtitis most likely due to indomethacin   . FINGER SURGERY Right    4th and 5th digit  . FLEXIBLE SIGMOIDOSCOPY  01/02/2012   SLF: 1. the colonic mucosa appeared normal in the sigmoid colon 2. Large internal hemorrhoids: cause for rectal bleeding/pain . s/p banding X 3   . HAND SURGERY    . REVERSE SHOULDER ARTHROPLASTY Left 05/29/2018   Procedure: REVERSE SHOULDER ARTHROPLASTY;  Surgeon: Hiram Gash, MD;  Location: [facility];  Service: Orthopedics;  Laterality: Left;  . SAVORY DILATION N/A 07/21/2014   Procedure: SAVORY DILATION;  Surgeon: [name], MD;  Location: [facility];  Service: Endoscopy;  Laterality: N/A;    Family History:  Family History  Problem Relation Age of Onset  . Hypertension Mother   . Hypertension Father   . Stroke Father   . Hypertension Sister   . Hypertension Brother   . Cancer Brother   . Cancer Brother        throat cancer  . Hypertension Brother   . Diabetes  Maternal Grandmother   . Colon cancer Neg Hx     Social History:  Social History   Socioeconomic History  . Marital status: Single    Spouse name: Not on file  . Number of children: 0  . Years of education: 65  . Highest education level: Not on file  Occupational History  . Occupation: unemployed, Retail buyer: UNEMPLOYED  Tobacco Use  . Smoking status: Former Smoker    Quit date: 08/16/2004    Years since quitting: 15.1  . Smokeless tobacco: Current User  . Tobacco comment:  no tobacco, has quit long time ago  Vaping Use  . Vaping Use: Never used  Substance and Sexual Activity  . Alcohol use: Yes    Alcohol/week: 6.0 standard drinks    Types: 6 Cans of beer per week    Comment: six pack beer daily  . Drug use: No  . Sexual activity: Not on file  Other Topics Concern  . Not on file  Social History Narrative   Lives alone   No caffeine   Right handed   Social Determinants of Health   Financial Resource Strain:   . Difficulty of Paying Living Expenses:   Food Insecurity:   . Worried About Charity fundraiser in the Last Year:   . Arboriculturist in the Last Year:   Transportation Needs:   . Film/video editor (Medical):   [name] Lack of Transportation (Non-Medical):   Physical Activity:   . Days of Exercise per Week:   . Minutes of Exercise per Session:   Stress:   . Feeling of Stress :   Social Connections:   . Frequency of Communication with Friends and Family:   . Frequency of Social Gatherings with Friends and Family:   . Attends Religious Services:   . Active Member of Clubs or Organizations:   . Attends Archivist Meetings:   [name] Marital Status:   Intimate Partner Violence:   . Fear of Current or Ex-Partner:   . Emotionally Abused:   [name] Physically Abused:   . Sexually Abused:     SDOH:  SDOH Screenings   Alcohol Screen: Medium Risk  . Last Alcohol Screening Score (AUDIT): 13  Depression (PHQ2-9):   . PHQ-2 Score:    Financial Resource Strain:   . Difficulty of Paying Living Expenses:   Food Insecurity:   . Worried About Charity fundraiser in the Last Year:   . [place] in the Last Year:   Housing:   . Last Housing Risk Score:   Physical Activity:   . Days of Exercise per Week:   . Minutes of Exercise per Session:   Social Connections:   . Frequency of Communication with Friends and Family:   . Frequency of Social Gatherings with Friends and Family:   . Attends Religious Services:   . Active Member of Clubs or Organizations:   . Attends Archivist Meetings:   [name] Marital Status:   Stress:   . Feeling of Stress :   Tobacco Use: High Risk  . Smoking Tobacco Use: Former Smoker  . Smokeless Tobacco Use: Current User  Transportation Needs:   . Lack of Transportation (Medical):   [name] Lack of Transportation (Non-Medical):     Last Labs:  Admission on 09/28/2019  Component Date Value Ref Range Status  . POC Amphetamine UR 09/28/2019 None Detected  None Detected Preliminary  . POC Secobarbital (BAR) 09/28/2019 None Detected  None Detected Preliminary  . POC Buprenorphine (BUP) 09/28/2019 None Detected  None Detected Preliminary  . POC Oxazepam (BZO) 09/28/2019 None Detected  None Detected Preliminary  . POC Cocaine UR 09/28/2019 None Detected  None Detected Preliminary  . POC Methamphetamine UR 09/28/2019 None Detected  None Detected Preliminary  . POC Morphine 09/28/2019 None Detected  None Detected Preliminary  . POC Oxycodone UR 09/28/2019 None Detected  None Detected Preliminary  . POC Methadone UR 09/28/2019 None Detected  None Detected Preliminary  . POC Marijuana UR 09/28/2019 None Detected  None  Detected Preliminary  . SARS Coronavirus 2 Ag 09/28/2019 NEGATIVE  NEGATIVE Final   Comment: (NOTE) SARS-CoV-2 antigen NOT DETECTED.   Negative results are presumptive.  Negative results do not preclude SARS-CoV-2 infection and should not be used as the sole basis for treatment  or other patient management decisions, including infection  control decisions, particularly in the presence of clinical signs and  symptoms consistent with COVID-19, or in those who have been in contact with the virus.  Negative results must be combined with clinical observations, patient history, and epidemiological information. The expected result is Negative.  Fact Sheet for Patients: PodPark.tn  Fact Sheet for Healthcare Providers: GiftContent.is   This test is not yet approved or cleared by the [place] FDA and  has been authorized for detection and/or diagnosis of SARS-CoV-2 by FDA under an Emergency Use Authorization (EUA).  This EUA will remain in effect (meaning this test can be used) for the duration of  the C                          OVID-19 declaration under Section 564(b)(1) of the Act, 21 U.S.C. section 360bbb-3(b)(1), unless the authorization is terminated or revoked sooner.    Admission on 06/05/2019, Discharged on 06/09/2019  Component Date Value Ref Range Status  . Hgb A1c MFr Bld 06/06/2019 5.6  4.8 - 5.6 % Final   Comment: REPEATED TO VERIFY (NOTE) Pre diabetes:          5.7%-6.4% Diabetes:              >6.4% Glycemic control for   <7.0% adults with diabetes   . Mean Plasma Glucose 06/06/2019 114.02  mg/dL Final   Performed at [facility] [address] [place], [place] [address]  . Cholesterol 06/06/2019 166  0 - 200 mg/dL Final  . Triglycerides 06/06/2019 138  <150 mg/dL Final  . HDL 06/06/2019 82  >40 mg/dL Final  . Total CHOL/HDL Ratio 06/06/2019 2.0  RATIO Final  . VLDL 06/06/2019 28  0 - 40 mg/dL Final  . LDL Cholesterol 06/06/2019 56  0 - 99 mg/dL Final   Comment:        Total Cholesterol/HDL:CHD Risk Coronary Heart Disease Risk Table                     Men   Women  1/2 Average Risk   3.4   3.3  Average Risk       5.0   4.4  2 X Average Risk   9.6   7.1  3 X Average Risk   23.4   11.0        Use the calculated Patient Ratio above and the CHD Risk Table to determine the patient's CHD Risk.        ATP III CLASSIFICATION (LDL):  <100     mg/dL   Optimal  100-129  mg/dL   Near or Above                    Optimal  130-159  mg/dL   Borderline  160-189  mg/dL   High  >190     mg/dL   Very High Performed at [facility] [address], [place], [place] [address]   . TSH 06/06/2019 1.878  0.350 - 4.500 uIU/mL Final   Comment: Performed by a 3rd Generation assay with a functional sensitivity of <=0.01 uIU/mL.  Performed at [facility], [place] [address] [place], [place] [address]   Admission on 06/03/2019, Discharged on 06/05/2019  Component Date Value Ref Range Status  . WBC 06/04/2019 6.7  4.0 - 10.5 K/uL Final  . RBC 06/04/2019 4.58  4.22 - 5.81 MIL/uL Final  . Hemoglobin 06/04/2019 14.4  13.0 - 17.0 g/dL Final  . HCT 06/04/2019 39.9  39 - 52 % Final  . MCV 06/04/2019 87.1  80.0 - 100.0 fL Final  . MCH 06/04/2019 31.4  26.0 - 34.0 pg Final  . MCHC 06/04/2019 36.1* 30.0 - 36.0 g/dL Final  . RDW 06/04/2019 13.6  11.5 - 15.5 % Final  . Platelets 06/04/2019 286  150 - 400 K/uL Final  . nRBC 06/04/2019 0.0  0.0 - 0.2 % Final  . Neutrophils Relative % 06/04/2019 87  % Final  . Neutro Abs 06/04/2019 5.9  1.7 - 7.7 K/uL Final  . Lymphocytes Relative 06/04/2019 9  % Final  . Lymphs Abs 06/04/2019 0.6* 0.7 - 4.0 K/uL Final  . Monocytes Relative 06/04/2019 3  % Final  . Monocytes Absolute 06/04/2019 0.2  0 - 1 K/uL Final  . Eosinophils Relative 06/04/2019 0  % Final  . Eosinophils Absolute 06/04/2019 0.0  0 - 0 K/uL Final  . Basophils Relative 06/04/2019 0  % Final  . Basophils Absolute 06/04/2019 0.0  0 - 0 K/uL Final  . Immature Granulocytes 06/04/2019 1  % Final  . Abs Immature Granulocytes 06/04/2019 0.04  0.00 - 0.07 K/uL Final   Performed at [facility], [address], [place], [place] [address]  .  Sodium 06/04/2019 135  135 - 145 mmol/L Final  . Potassium 06/04/2019 3.8  3.5 - 5.1 mmol/L Final  . Chloride 06/04/2019 97* 98 - 111 mmol/L Final  . CO2 06/04/2019 21* 22 - 32 mmol/L Final  . Glucose, Bld 06/04/2019 133* 70 - 99 mg/dL Final   Glucose reference range applies only to samples taken after fasting for at least 8 hours.  . BUN 06/04/2019 8  6 - 20 mg/dL Final  . Creatinine, Ser 06/04/2019 1.05  0.61 - 1.24 mg/dL Final  . Calcium 06/04/2019 9.5  8.9 - 10.3 mg/dL Final  . Total Protein 06/04/2019 8.4* 6.5 - 8.1 g/dL Final  . Albumin 06/04/2019 3.8  3.5 - 5.0 g/dL Final  . AST 06/04/2019 60* 15 - 41 U/L Final  . ALT 06/04/2019 35  0 - 44 U/L Final  . Alkaline Phosphatase 06/04/2019 102  38 - 126 U/L Final  . Total Bilirubin 06/04/2019 2.0* 0.3 - 1.2 mg/dL Final  . GFR calc non Af Amer 06/04/2019 >60  >60 mL/min Final  . GFR calc Af Amer 06/04/2019 >60  >60 mL/min Final  . Anion gap 06/04/2019 17* 5 - 15 Final   Performed at [facility], [address] [place], [place] [address]  . Alcohol, Ethyl (B) 06/04/2019 <10  <10 mg/dL Final   Comment: (NOTE) Lowest detectable limit for serum alcohol is 10 mg/dL. For medical purposes only. Performed at [facility], [address] [place],  [address]   . SARS Coronavirus 2 by RT PCR 06/04/2019 NEGATIVE  NEGATIVE Final   Comment: (NOTE) SARS-CoV-2 target nucleic acids are NOT DETECTED. The SARS-CoV-2 RNA is generally detectable in upper respiratoy specimens during the acute phase of infection. The lowest concentration of SARS-CoV-2 viral copies this assay can detect is 131 copies/mL. A negative result does not preclude SARS-Cov-2  infection and should not be used as the sole basis for treatment or other patient management decisions. A negative result may occur with  improper specimen collection/handling, submission of specimen other than nasopharyngeal swab, presence of viral mutation(s) within the areas  targeted by this assay, and inadequate number of viral copies (<131 copies/mL). A negative result must be combined with clinical observations, patient history, and epidemiological information. The expected result is Negative. Fact Sheet for Patients:  PinkCheek.be Fact Sheet for Healthcare Providers:  GravelBags.it This test is not yet ap                          proved or cleared by the [place] FDA and  has been authorized for detection and/or diagnosis of SARS-CoV-2 by FDA under an Emergency Use Authorization (EUA). This EUA will remain  in effect (meaning this test can be used) for the duration of the COVID-19 declaration under Section 564(b)(1) of the Act, 21 U.S.C. section 360bbb-3(b)(1), unless the authorization is terminated or revoked sooner.   . Influenza A by PCR 06/04/2019 NEGATIVE  NEGATIVE Final  . Influenza B by PCR 06/04/2019 NEGATIVE  NEGATIVE Final   Comment: (NOTE) The Xpert Xpress SARS-CoV-2/FLU/RSV assay is intended as an aid in  the diagnosis of influenza from Nasopharyngeal swab specimens and  should not be used as a sole basis for treatment. Nasal washings and  aspirates are unacceptable for Xpert Xpress SARS-CoV-2/FLU/RSV  testing. Fact Sheet for Patients: PinkCheek.be Fact Sheet for Healthcare Providers: GravelBags.it This test is not yet approved or cleared by the [place] FDA and  has been authorized for detection and/or diagnosis of SARS-CoV-2 by  FDA under an Emergency Use Authorization (EUA). This EUA will remain  in effect (meaning this test can be used) for the duration of the  Covid-19 declaration under Section 564(b)(1) of the Act, 21  U.S.C. section 360bbb-3(b)(1), unless the authorization is  terminated or revoked. Performed at [facility], [address], [place], [place] [address]   Admission on 05/23/2019,  Discharged on 05/24/2019  Component Date Value Ref Range Status  . Sodium 05/23/2019 139  135 - 145 mmol/L Final  . Potassium 05/23/2019 2.9* 3.5 - 5.1 mmol/L Final  . Chloride 05/23/2019 99  98 - 111 mmol/L Final  . CO2 05/23/2019 25  22 - 32 mmol/L Final  . Glucose, Bld 05/23/2019 128* 70 - 99 mg/dL Final   Glucose reference range applies only to samples taken after fasting for at least 8 hours.  . BUN 05/23/2019 11  6 - 20 mg/dL Final  . Creatinine, Ser 05/23/2019 1.26* 0.61 - 1.24 mg/dL Final  . Calcium 05/23/2019 9.6  8.9 - 10.3 mg/dL Final  . Total Protein 05/23/2019 8.5* 6.5 - 8.1 g/dL Final  . Albumin 05/23/2019 4.1  3.5 - 5.0 g/dL Final  . AST 05/23/2019 48* 15 - 41 U/L Final  . ALT 05/23/2019 38  0 - 44 U/L Final  . Alkaline Phosphatase 05/23/2019 77  38 - 126 U/L Final  . Total Bilirubin 05/23/2019 2.2* 0.3 - 1.2 mg/dL Final  . GFR calc non Af Amer 05/23/2019 >60  >60 mL/min Final  . GFR calc Af Amer 05/23/2019 >60  >60 mL/min Final  . Anion gap 05/23/2019 15  5 - 15 Final   Performed at [facility], [address] [place], [place] [address]  . Alcohol, Ethyl (B) 05/23/2019 <10  <10 mg/dL Final   Comment: (  NOTE) Lowest detectable limit for serum alcohol is 10 mg/dL. For medical purposes only. Performed at [facility], [address], [place], [place] [address]   . WBC 05/23/2019 8.7  4.0 - 10.5 K/uL Final  . RBC 05/23/2019 4.61  4.22 - 5.81 MIL/uL Final  . Hemoglobin 05/23/2019 14.3  13.0 - 17.0 g/dL Final  . HCT 05/23/2019 40.3  39 - 52 % Final  . MCV 05/23/2019 87.4  80.0 - 100.0 fL Final  . MCH 05/23/2019 31.0  26.0 - 34.0 pg Final  . MCHC 05/23/2019 35.5  30.0 - 36.0 g/dL Final  . RDW 05/23/2019 12.3  11.5 - 15.5 % Final  . Platelets 05/23/2019 238  150 - 400 K/uL Final  . nRBC 05/23/2019 0.0  0.0 - 0.2 % Final   Performed at [facility], [address], [place], [place] [address]  . Tricyclic, Ur Screen 86/57/8469 NONE DETECTED   NONE DETECTED Final  . Amphetamines, Ur Screen 05/23/2019 NONE DETECTED  NONE DETECTED Final  . MDMA (Ecstasy)Ur Screen 05/23/2019 NONE DETECTED  NONE DETECTED Final  . Cocaine Metabolite,Ur [place] 05/23/2019 NONE DETECTED  NONE DETECTED Final  . Opiate, Ur Screen 05/23/2019 NONE DETECTED  NONE DETECTED Final  . Phencyclidine (PCP) Ur S 05/23/2019 NONE DETECTED  NONE DETECTED Final  . Cannabinoid 50 Ng, Ur [place] 05/23/2019 NONE DETECTED  NONE DETECTED Final  . Barbiturates, Ur Screen 05/23/2019 NONE DETECTED  NONE DETECTED Final  . Benzodiazepine, Ur Scrn 05/23/2019 NONE DETECTED  NONE DETECTED Final  . Methadone Scn, Ur 05/23/2019 NONE DETECTED  NONE DETECTED Final   Comment: (NOTE) Tricyclics + metabolites, urine    Cutoff 1000 ng/mL Amphetamines + metabolites, urine  Cutoff 1000 ng/mL MDMA (Ecstasy), urine              Cutoff 500 ng/mL Cocaine Metabolite, urine          Cutoff 300 ng/mL Opiate + metabolites, urine        Cutoff 300 ng/mL Phencyclidine (PCP), urine         Cutoff 25 ng/mL Cannabinoid, urine                 Cutoff 50 ng/mL Barbiturates + metabolites, urine  Cutoff 200 ng/mL Benzodiazepine, urine              Cutoff 200 ng/mL Methadone, urine                   Cutoff 300 ng/mL The urine drug screen provides only a preliminary, unconfirmed analytical test result and should not be used for non-medical purposes. Clinical consideration and professional judgment should be applied to any positive drug screen result due to possible interfering substances. A more specific alternate chemical method must be used in order to obtain a confirmed analytical result. Gas chromatography / mass spectrometry (GC/MS) is the preferred confirmat                          ory method. Performed at [facility], [address], [place], [place] [address]   . Ammonia 05/24/2019 26  9 - 35 umol/L Final   Performed at [facility], [address] [place], [place] [address]     Allergies: Hydrocodone, Benadryl [diphenhydramine hcl (sleep)], and Hydrocortisone  PTA Medications: (Not in a hospital admission)   Medical Decision Making  Labs are pending. Urine drug screen negative QTC 480, previous QTC 486.  CIWA  protocol with Ativan 1 mg every 6 hours as needed for CIWA > 10  Resume home medications  Resume risperdal 2 mg nightly for auditory hallucinations/delusional disorder Continue metoprolol 12.5 milligrams twice daily for hypertension Continue Lipitor 20 mg daily for hyperlipidemia Continue aspirin 81 mg daily Continue gabapentin 800 mg 3 times daily for chronic pain Continue Protonix 40 mg daily for GERD MVI for nutritional supplementation     Recommendations  Based on my evaluation the patient does not appear to have an emergency medical condition.  Patient will be placed in the continuous assessment area at [facility] for treatment and stabilization. He will be reevaluated on 09/28/2019. The treatment team will determine disposition at that time.      Rozetta Nunnery, NP 09/28/19  3:32 AM

## 2019-09-28 NOTE — ED Notes (Signed)
Mr. Arpan belongings stored in locker # 19 areBaseball cap, wallet, snuff, papers and glasses.

## 2019-09-28 NOTE — ED Notes (Signed)
D: Pt alert and oriented on the unit.   A: Education, support, and encouragement provided. Discharge summary, medications and follow up appointments reviewed with pt. Suicide prevention resources provided. Pt's belongings in locker returned and belongings sheet signed.  R: Pt denies SI/HI, A/VH or any concerns at this time. Pt ambulatory with and without walker on and off unit. Pt discharged to lobby.

## 2019-09-28 NOTE — ED Notes (Signed)
Pt sleeping at present, no distress noted.  Monitoring for safety. 

## 2019-09-28 NOTE — Discharge Instructions (Addendum)

## 2019-09-28 NOTE — ED Triage Notes (Signed)
EMS transport, presents with auditory hallucinations.

## 2019-09-28 NOTE — ED Provider Notes (Signed)
FBC/OBS ASAP Discharge Summary  Date and Time: 09/28/2019 11:26 AM  Name: Tristan Cook  MRN:  [id number]   Discharge Diagnoses:  Final diagnoses:  Alcohol use disorder, severe, dependence ([place])  Auditory hallucinations    Subjective: Patient states "I would like a list of shelters in the area."  Stay Summary:  From admission H&P: HPI: Tristan Cook is a 59 year old male who presents to be voluntarily with EMS with reports of auditory hallucinations.  He has a history of alcohol use disorder,  chronic back pain, hypertension, neuropathy, and arthritis.  He reports that he drinks 3-40 ounce beers per day.  He reports last drink was yesterday.  He denies a history of withdrawal seizures and DTs.  He denies other substance use.   Patient reports that he is angry with his girlfriend because she was supposed to pick him up this evening, but she did not show up.  Patient assessed by nurse practitioner today.  Patient alert and oriented, answers appropriately.  Patient pleasant cooperative during assessment. Patient denies suicidal and homicidal ideations.  Patient denies history of suicide attempts, denies history of self-harm behaviors.  Patient denies visual hallucinations.  Patient reports intermittent auditory hallucinations that "saying they are going to hurt me."  Patient denies auditory hallucinations currently however states they have been intermittent for approximately 1 year. Patient denies symptoms of paranoia.  Patient does not appear to be responding to internal stimuli. Patient reports he is currently homeless in [place] as he was displaced from his motel 1 week ago.  Patient reports he has resided in the hotel for about 2 years.  Patient denies access to weapons.  Patient reports he has family and friends in [place] but he would prefer to remain in [place].  Patient states "I have tried staying with my sister and it did not work out." Patient reports he currently receives  disability benefits.  Patient reports he drinks approximately 3 beers daily.  Patient denies substance use. Patient offered support and encouragement.  Total Time spent with patient: 30 minutes  Past Psychiatric History: Alcohol use disorder, psychosis, delusional disorder, adjustment disorder with depressed mood Past Medical History:  Past Medical History:  Diagnosis Date  . Abdominal pain   . Adenomatous colon polyp 2008  . Anxiety   . Arthritis   . Back pain   . Bilateral lumbar radiculopathy   . Blurred vision, bilateral   . Chronic back pain   . Constipation   . Depression   . GERD (gastroesophageal reflux disease)   . Gout   . Hemorrhoids   . High cholesterol   . Hypertension   . Neck pain   . Neuropathy   . Neuropathy of both feet   . Numbness    rectal  . Numbness of legs   . Stroke ([place]) 2019  . Substance abuse ([place])    h/o excessive alcohol use; pt says he limits use to one to 2 beers daily he currently    Past Surgical History:  Procedure Laterality Date  . CATARACT EXTRACTION W/PHACO Right 08/16/2018   Procedure: CATARACT EXTRACTION PHACO AND INTRAOCULAR LENS PLACEMENT RIGHT EYE;  Surgeon: Baruch Goldmann, MD;  Location: [facility];  Service: Ophthalmology;  Laterality: Right;  CDE: 4.18  . COLONOSCOPY  10/09/2006   3 mm pedunculated sigmoid colon polyp removed/8-mm sessile hepatic flexure polyp (tubular villous adenoma) removed/6-mm  descending colon polyp removed/small internal hemorrhoids  . COLONOSCOPY  02/28/2011   XFG:HWEXHB, multiple in the rectum/Internal hemorrhoids, MODERATE-CAUSING  RECTAL BLEEDING  . COLONOSCOPY N/A 07/05/2016   Procedure: COLONOSCOPY;  Surgeon: [name], MD;  Location: [facility];  Service: Endoscopy;  Laterality: N/A;  11:15am  . 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 Mild erosive gastrtitis most likely due to indomethacin   . FINGER SURGERY Right    4th and 5th digit   . FLEXIBLE SIGMOIDOSCOPY  01/02/2012   SLF: 1. the colonic mucosa appeared normal in the sigmoid colon 2. Large internal hemorrhoids: cause for rectal bleeding/pain . s/p banding X 3   . HAND SURGERY    . REVERSE SHOULDER ARTHROPLASTY Left 05/29/2018   Procedure: REVERSE SHOULDER ARTHROPLASTY;  Surgeon: Hiram Gash, MD;  Location: [facility];  Service: Orthopedics;  Laterality: Left;  . SAVORY DILATION N/A 07/21/2014   Procedure: SAVORY DILATION;  Surgeon: [name], MD;  Location: [facility];  Service: Endoscopy;  Laterality: N/A;   Family History:  Family History  Problem Relation Age of Onset  . Hypertension Mother   . Hypertension Father   . Stroke Father   . Hypertension Sister   . Hypertension Brother   . Cancer Brother   . Cancer Brother        throat cancer  . Hypertension Brother   . Diabetes Maternal Grandmother   . Colon cancer Neg Hx    Family Psychiatric History: None reported Social History:  Social History   Substance and Sexual Activity  Alcohol Use Yes  . Alcohol/week: 6.0 standard drinks  . Types: 6 Cans of beer per week   Comment: six pack beer daily     Social History   Substance and Sexual Activity  Drug Use No    Social History   Socioeconomic History  . Marital status: Single    Spouse name: Not on file  . Number of children: 0  . Years of education: 35  . Highest education level: Not on file  Occupational History  . Occupation: unemployed, Retail buyer: UNEMPLOYED  Tobacco Use  . Smoking status: Former Smoker    Quit date: 08/16/2004    Years since quitting: 15.1  . Smokeless tobacco: Current User  . Tobacco comment: no tobacco, has quit long time ago  Vaping Use  . Vaping Use: Never used  Substance and Sexual Activity  . Alcohol use: Yes    Alcohol/week: 6.0 standard drinks    Types: 6 Cans of beer per week    Comment: six pack beer daily  . Drug use: No  . Sexual activity: Not on file  Other Topics Concern  .  Not on file  Social History Narrative   Lives alone   No caffeine   Right handed   Social Determinants of Health   Financial Resource Strain:   . Difficulty of Paying Living Expenses:   Food Insecurity:   . Worried About Charity fundraiser in the Last Year:   . Arboriculturist in the Last Year:   Transportation Needs:   . Film/video editor (Medical):   [name] Lack of Transportation (Non-Medical):   Physical Activity:   . Days of Exercise per Week:   . Minutes of Exercise per Session:   Stress:   . Feeling of Stress :   Social Connections:   . Frequency of Communication with Friends and Family:   . Frequency of Social Gatherings with Friends and Family:   . Attends Religious Services:   .  Active Member of Clubs or Organizations:   . Attends Archivist Meetings:   [name] Marital Status:    SDOH:  SDOH Screenings   Alcohol Screen: Medium Risk  . Last Alcohol Screening Score (AUDIT): 13  Depression (PHQ2-9):   . PHQ-2 Score:   Financial Resource Strain:   . Difficulty of Paying Living Expenses:   Food Insecurity:   . Worried About Charity fundraiser in the Last Year:   . [place] in the Last Year:   Housing:   . Last Housing Risk Score:   Physical Activity:   . Days of Exercise per Week:   . Minutes of Exercise per Session:   Social Connections:   . Frequency of Communication with Friends and Family:   . Frequency of Social Gatherings with Friends and Family:   . Attends Religious Services:   . Active Member of Clubs or Organizations:   . Attends Archivist Meetings:   [name] Marital Status:   Stress:   . Feeling of Stress :   Tobacco Use: High Risk  . Smoking Tobacco Use: Former Smoker  . Smokeless Tobacco Use: Current User  Transportation Needs:   . Lack of Transportation (Medical):   [name] Lack of Transportation (Non-Medical):     Has this patient used any form of tobacco in the last 30 days? (Cigarettes, Smokeless Tobacco, Cigars, and/or  Pipes) A prescription for an FDA-approved tobacco cessation medication was offered at discharge and the patient refused  Current Medications:  Current Facility-Administered Medications  Medication Dose Route Frequency Provider Last Rate Last Admin  . acetaminophen (TYLENOL) tablet 650 mg  650 mg Oral Q6H PRN Lindon Romp A, NP      . alum & mag hydroxide-simeth (MAALOX/MYLANTA) 200-200-20 MG/5ML suspension 30 mL  30 mL Oral Q4H PRN Lindon Romp A, NP      . aspirin EC tablet 81 mg  81 mg Oral Daily Lindon Romp A, NP   81 mg at 09/28/19 0950  . atorvastatin (LIPITOR) tablet 20 mg  20 mg Oral Daily Lindon Romp A, NP   20 mg at 09/28/19 0951  . gabapentin (NEURONTIN) capsule 800 mg  800 mg Oral TID Lindon Romp A, NP   800 mg at 09/28/19 0950  . hydrOXYzine (ATARAX/VISTARIL) tablet 25 mg  25 mg Oral Q6H PRN Lindon Romp A, NP      . loperamide (IMODIUM) capsule 2-4 mg  2-4 mg Oral PRN Lindon Romp A, NP      . LORazepam (ATIVAN) tablet 1 mg  1 mg Oral Q6H PRN Lindon Romp A, NP      . magnesium hydroxide (MILK OF MAGNESIA) suspension 30 mL  30 mL Oral Daily PRN Lindon Romp A, NP      . metoprolol tartrate (LOPRESSOR) tablet 12.5 mg  12.5 mg Oral BID Lindon Romp A, NP   12.5 mg at 09/28/19 0951  . multivitamin with minerals tablet 1 tablet  1 tablet Oral Daily Lindon Romp A, NP   1 tablet at 09/28/19 0951  . ondansetron (ZOFRAN-ODT) disintegrating tablet 4 mg  4 mg Oral Q6H PRN Lindon Romp A, NP      . pantoprazole (PROTONIX) EC tablet 40 mg  40 mg Oral Daily Lindon Romp A, NP   40 mg at 09/28/19 0950  . risperiDONE (RISPERDAL) tablet 2 mg  2 mg Oral QHS Lindon Romp A, NP   2 mg at 09/28/19 0148   Current Outpatient Medications  Medication Sig Dispense Refill  . gabapentin (NEURONTIN) 800 MG tablet Take by mouth.    [name] aspirin EC 81 MG tablet Take 1 tablet (81 mg total) by mouth daily. 30 tablet 1  . atorvastatin (LIPITOR) 20 MG tablet Take 20 mg by mouth daily.     . DULoxetine  (CYMBALTA) 20 MG capsule Take 1 capsule (20 mg total) by mouth daily. 30 capsule 0  . furosemide (LASIX) 40 MG tablet Take 40 mg by mouth daily.     . metoprolol tartrate (LOPRESSOR) 25 MG tablet Take 0.5 tablets (12.5 mg total) by mouth 2 (two) times daily. 30 tablet 0  . Multiple Vitamin (MULTIVITAMIN WITH MINERALS) TABS tablet Take 1 tablet by mouth daily.    . pantoprazole (PROTONIX) 40 MG tablet Take 1 tablet (40 mg total) by mouth daily. 30 tablet 0  . risperiDONE (RISPERDAL) 2 MG tablet Take 1 tablet (2 mg total) by mouth at bedtime. 30 tablet 0    PTA Medications: (Not in a hospital admission)   Musculoskeletal  Strength & Muscle Tone: within normal limits Gait & Station: normal Patient leans: N/A  Psychiatric Specialty Exam  Presentation  General Appearance: Appropriate for Environment;Well Groomed  Eye Contact:Fair  Speech:Clear and Coherent;Normal Rate  Speech Volume:Normal  Handedness:No data recorded  Mood and Affect  Mood:Depressed  Affect:Congruent;Depressed   Thought Process  Thought Processes:Coherent;Linear  Descriptions of Associations:Intact  Orientation:Full (Time, Place and Person)  Thought Content:Logical  Hallucinations:Hallucinations: Auditory Description of Auditory Hallucinations: reports that he hears voices that tel him that they are going to hurt him.  Ideas of Reference:None  Suicidal Thoughts:Suicidal Thoughts: No  Homicidal Thoughts:Homicidal Thoughts: No   Sensorium  Memory:Immediate Good;Recent Good;Remote Good  Judgment:Fair  Insight:Fair   Executive Functions  Concentration:Good  Attention Span:Good  Recall:Good  Fund of Knowledge:Good  Language:Good   Psychomotor Activity  Psychomotor Activity:Psychomotor Activity: Normal   Assets  Assets:Communication Skills;Desire for Improvement;Physical Health;Resilience   Sleep  Sleep:Sleep: Fair   Physical Exam  Physical Exam Vitals and nursing note  reviewed.  Constitutional:      Appearance: He is well-developed.  HENT:     Head: Normocephalic.  Cardiovascular:     Rate and Rhythm: Normal rate.  Pulmonary:     Effort: Pulmonary effort is normal.  Neurological:     Mental Status: He is alert and oriented to person, place, and time.  Psychiatric:        Attention and Perception: Attention normal. He perceives auditory hallucinations.        Mood and Affect: Mood and affect normal.        Speech: Speech normal.        Behavior: Behavior normal. Behavior is cooperative.        Thought Content: Thought content normal.        Cognition and Memory: Cognition normal.        Judgment: Judgment normal.    Review of Systems  Constitutional: Negative.   HENT: Negative.   Eyes: Negative.   Respiratory: Negative.   Cardiovascular: Negative.   Gastrointestinal: Negative.   Genitourinary: Negative.   Musculoskeletal: Negative.   Skin: Negative.   Neurological: Negative.   Endo/Heme/Allergies: Negative.   Psychiatric/Behavioral: Positive for hallucinations and substance abuse.   Blood pressure 128/81, pulse 90, temperature 97.7 F (36.5 C), temperature source Tympanic, resp. rate 18, height 6\' 2"  (1.88 m), weight 77.1 kg, SpO2 99 %. Body mass index is 21.83 kg/m.  Demographic Factors:  Male  Loss  Factors: NA  Historical Factors: NA  Risk Reduction Factors:   Positive social support, Positive therapeutic relationship and Positive coping skills or problem solving skills  Continued Clinical Symptoms:  Alcohol/Substance Abuse/Dependencies  Cognitive Features That Contribute To Risk:  None    Suicide Risk:  Minimal: No identifiable suicidal ideation.  Patients presenting with no risk factors but with morbid ruminations; may be classified as minimal risk based on the severity of the depressive symptoms  Plan Of Care/Follow-up recommendations:  Patient reviewed with Dr. Darleene Cleaver. Peers support consult initiated. Follow-up  with outpatient psychiatry. Other:  Follow-up with outpatient substance use resources and housing resources.  Disposition: Discharge  Emmaline Kluver, FNP 09/28/2019, 11:26 AM

## 2019-09-28 NOTE — ED Notes (Signed)
Patient using phone 

## 2019-09-30 ENCOUNTER — Ambulatory Visit (INDEPENDENT_AMBULATORY_CARE_PROVIDER_SITE_OTHER): Payer: Medicare Other | Admitting: Vascular Surgery

## 2019-09-30 ENCOUNTER — Encounter (INDEPENDENT_AMBULATORY_CARE_PROVIDER_SITE_OTHER): Payer: Self-pay

## 2019-10-03 ENCOUNTER — Other Ambulatory Visit: Payer: Self-pay

## 2019-10-03 ENCOUNTER — Emergency Department
Admission: EM | Admit: 2019-10-03 | Discharge: 2019-10-04 | Disposition: A | Discharge status: Home / Self Care | Payer: Medicare Other | Attending: Emergency Medicine | Admitting: Emergency Medicine

## 2019-10-03 DIAGNOSIS — Z96611 Presence of right artificial shoulder joint: Secondary | ICD-10-CM | POA: Diagnosis not present

## 2019-10-03 DIAGNOSIS — F1010 Alcohol abuse, uncomplicated: Secondary | ICD-10-CM

## 2019-10-03 DIAGNOSIS — R440 Auditory hallucinations: Secondary | ICD-10-CM | POA: Diagnosis present

## 2019-10-03 DIAGNOSIS — F1020 Alcohol dependence, uncomplicated: Secondary | ICD-10-CM | POA: Diagnosis present

## 2019-10-03 DIAGNOSIS — I10 Essential (primary) hypertension: Secondary | ICD-10-CM | POA: Diagnosis not present

## 2019-10-03 DIAGNOSIS — Z8673 Personal history of transient ischemic attack (TIA), and cerebral infarction without residual deficits: Secondary | ICD-10-CM | POA: Diagnosis not present

## 2019-10-03 DIAGNOSIS — Z7982 Long term (current) use of aspirin: Secondary | ICD-10-CM | POA: Diagnosis not present

## 2019-10-03 DIAGNOSIS — Z87891 Personal history of nicotine dependence: Secondary | ICD-10-CM | POA: Insufficient documentation

## 2019-10-03 DIAGNOSIS — F10151 Alcohol abuse with alcohol-induced psychotic disorder with hallucinations: Secondary | ICD-10-CM | POA: Insufficient documentation

## 2019-10-03 DIAGNOSIS — F10951 Alcohol use, unspecified with alcohol-induced psychotic disorder with hallucinations: Secondary | ICD-10-CM | POA: Diagnosis not present

## 2019-10-03 DIAGNOSIS — Z5900 Homelessness unspecified: Secondary | ICD-10-CM

## 2019-10-03 DIAGNOSIS — Z79899 Other long term (current) drug therapy: Secondary | ICD-10-CM | POA: Insufficient documentation

## 2019-10-03 LAB — CBC
HCT: 38.4 % — ABNORMAL LOW (ref 39.0–52.0)
Hemoglobin: 13.7 g/dL (ref 13.0–17.0)
MCH: 32.9 pg (ref 26.0–34.0)
MCHC: 35.7 g/dL (ref 30.0–36.0)
MCV: 92.1 fL (ref 80.0–100.0)
Platelets: 297 10*3/uL (ref 150–400)
RBC: 4.17 MIL/uL — ABNORMAL LOW (ref 4.22–5.81)
RDW: 12.9 % (ref 11.5–15.5)
WBC: 5.6 10*3/uL (ref 4.0–10.5)
nRBC: 0 % (ref 0.0–0.2)

## 2019-10-03 LAB — COMPREHENSIVE METABOLIC PANEL
ALT: 33 U/L (ref 0–44)
AST: 39 U/L (ref 15–41)
Albumin: 3.9 g/dL (ref 3.5–5.0)
Alkaline Phosphatase: 60 U/L (ref 38–126)
Anion gap: 12 (ref 5–15)
BUN: 8 mg/dL (ref 6–20)
CO2: 23 mmol/L (ref 22–32)
Calcium: 8.9 mg/dL (ref 8.9–10.3)
Chloride: 101 mmol/L (ref 98–111)
Creatinine, Ser: 1.02 mg/dL (ref 0.61–1.24)
GFR calc Af Amer: >60 mL/min (ref >60)
GFR calc non Af Amer: >60 mL/min (ref >60)
Glucose, Bld: 92 mg/dL (ref 70–99)
Potassium: 3.4 mmol/L — ABNORMAL LOW (ref 3.5–5.1)
Sodium: 136 mmol/L (ref 135–145)
Total Bilirubin: 0.5 mg/dL (ref 0.3–1.2)
Total Protein: 8 g/dL (ref 6.5–8.1)

## 2019-10-03 LAB — URINE DRUG SCREEN, QUALITATIVE (ARMC ONLY)
Amphetamines, Ur Screen: NOT DETECTED
Barbiturates, Ur Screen: NOT DETECTED
Benzodiazepine, Ur Scrn: NOT DETECTED
Cannabinoid 50 Ng, Ur ~~LOC~~: NOT DETECTED
Cocaine Metabolite,Ur ~~LOC~~: NOT DETECTED
MDMA (Ecstasy)Ur Screen: NOT DETECTED
Methadone Scn, Ur: NOT DETECTED
Opiate, Ur Screen: NOT DETECTED
Phencyclidine (PCP) Ur S: NOT DETECTED
Tricyclic, Ur Screen: NOT DETECTED

## 2019-10-03 LAB — ETHANOL: Alcohol, Ethyl (B): 383 mg/dL (ref <10)

## 2019-10-03 LAB — SALICYLATE LEVEL: Salicylate Lvl: <7 mg/dL — ABNORMAL LOW (ref 7.0–30.0)

## 2019-10-03 LAB — ACETAMINOPHEN LEVEL: Acetaminophen (Tylenol), Serum: <10 ug/mL — ABNORMAL LOW (ref 10–30)

## 2019-10-03 NOTE — ED Triage Notes (Signed)
Patient reports auditory hallucinations X 1 year. Patient denies SI/HI.

## 2019-10-03 NOTE — ED Notes (Signed)
Pt. Transferred from Triage to room after dressing out and screening for contraband. Report to include Situation, Background, Assessment and Recommendations from [facility]. Pt. Oriented to Quad including Q15 minute rounds as well as Engineer, drilling for their protection. Patient is alert and oriented, warm and dry in no acute distress. Patient denies SI, HI, and VH. Patient states he hears voices without command. Pt. Encouraged to let me know if needs arise.

## 2019-10-03 NOTE — ED Provider Notes (Signed)
[facility] Emergency Department Provider Note ____________________________________________   First MD Initiated Contact with Patient 10/03/19 2219     (approximate)  I have reviewed the triage vital signs and the nursing notes.   HISTORY  Chief Complaint Hallucinations    HPI Tristan Cook is a 59 y.o. male with PMH as noted below who presents stating he wants "a good detox."  He also reports auditory hallucinations that are longstanding, and do not tell him to kill himself or anything in particular.  He denies SI or HI.  He denies any acute medical complaints.  Past Medical History:  Diagnosis Date  . Abdominal pain   . Adenomatous colon polyp 2008  . Anxiety   . Arthritis   . Back pain   . Bilateral lumbar radiculopathy   . Blurred vision, bilateral   . Chronic back pain   . Constipation   . Depression   . GERD (gastroesophageal reflux disease)   . Gout   . Hemorrhoids   . High cholesterol   . Hypertension   . Neck pain   . Neuropathy   . Neuropathy of both feet   . Numbness    rectal  . Numbness of legs   . Stroke ([place]) 2019  . Substance abuse ([place])    h/o excessive alcohol use; pt says he limits use to one to 2 beers daily he currently    Patient Active Problem List   Diagnosis Date Noted  . Acute psychosis ([place]) 06/05/2019  . Delusional disorder ([place]) 05/24/2019  . Swelling of limb 03/11/2019  . Peripheral polyneuropathy 02/12/2019  . Alcohol intoxication ([place]) 08/27/2018  . Hallucinations 08/27/2018  . MDD (major depressive disorder), recurrent, severe, with psychosis ([place]) 08/27/2018  . Alcohol use disorder, severe, dependence ([place]) 08/27/2018  . TIA (transient ischemic attack) 05/29/2018  . Syncope 05/29/2018  . Alcohol abuse 05/29/2018  . Fall 05/29/2018  . Dislocation of left shoulder joint 05/29/2018  . Closed fracture of left proximal humerus 05/29/2018  . Abnormal LFTs 05/29/2018  . Hypokalemia 05/29/2018  .  Constipation 10/17/2017  . Hoarseness 10/17/2017  . Weakness 04/11/2017  . Weakness of both legs 04/10/2017  . Vitamin B deficiency 01/22/2017  . 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 Stroke, small vessel ([place]) 01/17/2017  . 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 Vitamin B12 deficiency neuropathy ([place]) 01/17/2017  . Adjustment disorder with depressed mood 08/15/2016  . History of colonic polyps 06/14/2016  . Elevated blood pressure reading 06/14/2016  . Hemorrhoids 09/20/2015  . Depressive disorder 08/03/2015  . Back pain with radiation 06/15/2015  . Insomnia 06/03/2015  . Hyperlipidemia LDL goal <130 04/25/2015  . 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 Fatigue 03/29/2015  . Allergic rhinitis 02/10/2015  . Esophageal stricture 10/22/2014  . Overweight (BMI 25.0-29.9) 10/06/2013  . GERD (gastroesophageal reflux disease) 04/23/2012  . Hemorrhoids, internal 11/02/2011  . Essential hypertension 07/25/2007  . Neck pain on left side 07/25/2007    Past Surgical History:  Procedure Laterality Date  . CATARACT EXTRACTION W/PHACO Right 08/16/2018   Procedure: CATARACT EXTRACTION PHACO AND INTRAOCULAR LENS PLACEMENT RIGHT EYE;  Surgeon: Baruch Goldmann, MD;  Location: [facility];  Service: Ophthalmology;  Laterality: Right;  CDE: 4.18  . COLONOSCOPY  10/09/2006   3 mm pedunculated sigmoid colon polyp removed/8-mm sessile hepatic flexure polyp (tubular villous adenoma) removed/6-mm  descending colon polyp removed/small internal hemorrhoids  . COLONOSCOPY  02/28/2011   [id number], multiple in the rectum/Internal hemorrhoids, MODERATE-CAUSING RECTAL BLEEDING  .  COLONOSCOPY N/A 07/05/2016   Procedure: COLONOSCOPY;  Surgeon: [name], MD;  [facility];  Service: Endoscopy;  Laterality: N/A;  11:15am  . 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 Mild erosive gastrtitis most likely due to indomethacin   . FINGER SURGERY Right      4th and 5th digit  . FLEXIBLE SIGMOIDOSCOPY  01/02/2012   SLF: 1. the colonic mucosa appeared normal in the sigmoid colon 2. Large internal hemorrhoids: cause for rectal bleeding/pain . s/p banding X 3   . HAND SURGERY    . REVERSE SHOULDER ARTHROPLASTY Left 05/29/2018   Procedure: REVERSE SHOULDER ARTHROPLASTY;  Surgeon: Hiram Gash, MD;  Location: [place];  Service: Orthopedics;  Laterality: Left;  . SAVORY DILATION N/A 07/21/2014   Procedure: SAVORY DILATION;  Surgeon: [name], MD;  [facility];  Service: Endoscopy;  Laterality: N/A;    Prior to Admission medications   Medication Sig Start Date End Date Taking? Authorizing Provider  aspirin EC 81 MG tablet Take 1 tablet (81 mg total) by mouth daily. 09/04/18   Clapacs, Madie Reno, MD  atorvastatin (LIPITOR) 20 MG tablet Take 20 mg by mouth daily.     [provider]  DULoxetine (CYMBALTA) 20 MG capsule Take 1 capsule (20 mg total) by mouth daily. Patient not taking: Reported on 09/28/2019 06/10/19   Connye Burkitt, NP  furosemide (LASIX) 40 MG tablet Take 40 mg by mouth daily.     [provider]  gabapentin (NEURONTIN) 800 MG tablet Take by mouth. Patient not taking: Reported on 09/28/2019 08/06/19 08/05/20  [provider]  metoprolol tartrate (LOPRESSOR) 25 MG tablet Take 0.5 tablets (12.5 mg total) by mouth 2 (two) times daily. 06/09/19   Connye Burkitt, NP  Multiple Vitamin (MULTIVITAMIN WITH MINERALS) TABS tablet Take 1 tablet by mouth daily. 06/10/19   Connye Burkitt, NP  pantoprazole (PROTONIX) 40 MG tablet Take 1 tablet (40 mg total) by mouth daily. Patient not taking: Reported on 09/28/2019 06/10/19   Connye Burkitt, NP  risperiDONE (RISPERDAL) 2 MG tablet Take 1 tablet (2 mg total) by mouth at bedtime. Patient not taking: Reported on 09/28/2019 06/09/19   Connye Burkitt, NP  tetrahydrozoline 0.05 % ophthalmic solution Place 1 drop into both eyes daily as needed.    [provider]     Allergies Hydrocodone, Benadryl [diphenhydramine hcl (sleep)], and Hydrocortisone  Family History  Problem Relation Age of Onset  . Hypertension Mother   . Hypertension Father   . Stroke Father   . Hypertension Sister   . Hypertension Brother   . Cancer Brother   . Cancer Brother        throat cancer  . Hypertension Brother   . Diabetes Maternal Grandmother   . Colon cancer Neg Hx     Social History Social History   Tobacco Use  . Smoking status: Former Smoker    Quit date: 08/16/2004    Years since quitting: 15.1  . Smokeless tobacco: Current User  . Tobacco comment: no tobacco, has quit long time ago  Vaping Use  . Vaping Use: Never used  Substance Use Topics  . Alcohol use: Yes    Alcohol/week: 6.0 standard drinks    Types: 6 Cans of beer per week    Comment: six pack beer daily  . Drug use: No    Review of Systems  Constitutional: No fever/chills. Eyes:  No visual changes. ENT: No sore throat. Cardiovascular: Denies chest pain. Respiratory: Denies shortness of breath. Gastrointestinal: No vomiting. Genitourinary: Negative for dysuria.  Musculoskeletal: Negative for acute back pain. Skin: Negative for rash. Neurological: Negative for headache.   ____________________________________________   PHYSICAL EXAM:  VITAL SIGNS: ED Triage Vitals  Enc Vitals Group     BP 10/03/19 2127 (!) 140/100     Pulse Rate 10/03/19 2127 93     Resp 10/03/19 2127 16     Temp 10/03/19 2127 98.9 F (37.2 C)     Temp Source 10/03/19 2127 Oral     SpO2 10/03/19 2127 98 %     Weight --      Height --      Head Circumference --      Peak Flow --      Pain Score 10/03/19 2206 0     Pain Loc --      Pain Edu? --      Excl. in [place]? --     Constitutional: Alert and oriented. Well appearing and in no acute distress. Eyes: Conjunctivae are normal.  Head: Atraumatic. Nose: No congestion/rhinnorhea. Mouth/Throat: Mucous membranes are moist.   Neck: Normal range of  motion.  Cardiovascular: Normal rate, regular rhythm.   Good peripheral circulation. Respiratory: Normal respiratory effort.  No retractions. Gastrointestinal:  No distention.  Musculoskeletal: Extremities warm and well perfused.  Neurologic:  Normal speech and language. No gross focal neurologic deficits are appreciated.  Skin:  Skin is warm and dry. No rash noted. Psychiatric: Mood and affect are normal. Speech and behavior are normal.  ____________________________________________   LABS (all labs ordered are listed, but only abnormal results are displayed)  Labs Reviewed  COMPREHENSIVE METABOLIC PANEL - Abnormal; Notable for the following components:      Result Value   Potassium 3.4 (*)    All other components within normal limits  ETHANOL - Abnormal; Notable for the following components:   Alcohol, Ethyl (B) 383 (*)    All other components within normal limits  SALICYLATE LEVEL - Abnormal; Notable for the following components:   Salicylate Lvl <3.3 (*)    All other components within normal limits  ACETAMINOPHEN LEVEL - Abnormal; Notable for the following components:   Acetaminophen (Tylenol), Serum <10 (*)    All other components within normal limits  CBC - Abnormal; Notable for the following components:   RBC 4.17 (*)    HCT 38.4 (*)    All other components within normal limits  URINE DRUG SCREEN, QUALITATIVE ([facility] ONLY)   ____________________________________________  EKG   ____________________________________________  RADIOLOGY    ____________________________________________   PROCEDURES  Procedure(s) performed: No  Procedures  Critical Care performed: No ____________________________________________   INITIAL IMPRESSION / ASSESSMENT AND PLAN / ED COURSE  Pertinent labs & imaging results that were available during my care of the patient were reviewed by me and considered in my medical decision making (see chart for details).  59 year old male with  PMH as noted above presents stating he has been drinking and wants detox, and also states he has hallucinations.  However, these hallucinations have been chronic and do not tell him to harm himself.  He denies SI or HI.  He states he is not on any psych medication.  He repeatedly talks about "going downstairs" i.e. to inpatient psychiatry and states he will tell the psychiatrist "what ever I need to" in order to get down there.  On exam, he is well-appearing his  vital signs are normal except for mild hypertension.  He is alert and oriented.  Neurologic exam is nonfocal.  The patient is overall jovial and pleasant, repeatedly making jokes with me throughout my history taking.  At this time, there is no evidence of acute danger to self or others.  The patient's alcohol level is 383, but his other labs are within normal limits.  I have ordered psychiatry and TTS consultations.  There is no indication for IVC at this time.  ----------------------------------------- 11:47 PM on 10/03/2019 -----------------------------------------  I am signing the patient out to the oncoming physician Dr. Karma Greaser.  Plan will be psych reassessment in the morning after the patient has had time to sober.  ____________________________________________   FINAL CLINICAL IMPRESSION(S) / ED DIAGNOSES  Final diagnoses:  Alcohol abuse  Auditory hallucination      NEW MEDICATIONS STARTED DURING THIS VISIT:  New Prescriptions   No medications on file     Note:  This document was prepared using Dragon voice recognition software and may include unintentional dictation errors.   Arta Silence, MD 10/03/19 2348

## 2019-10-03 NOTE — ED Notes (Addendum)
Hourly rounding reveals patient awake in hall bed. No complaints, stable, in no acute distress. Q15 minute rounds and monitoring via Rover and Officer to continue.  

## 2019-10-03 NOTE — ED Notes (Signed)
Pt dressed out. Pt belongings bag:  1 pair black shoes 1 grey pants 1 blue hat 1 red shirt 1 silver coloured chain 1 black underwear 2 black knee braces Pt also has a walker and meds were handed off to RN.   Pt states he is here "for a bed for the night, if not he would be sleeping under the bridge." Pt also states he had "Multiple beers" tonight.

## 2019-10-03 NOTE — ED Notes (Signed)
Hourly rounding reveals patient in hall bed talking to TTS. No complaints, stable, in no acute distress. Q15 minute rounds and monitoring via Engineer, drilling to continue.

## 2019-10-04 DIAGNOSIS — F10951 Alcohol use, unspecified with alcohol-induced psychotic disorder with hallucinations: Secondary | ICD-10-CM

## 2019-10-04 DIAGNOSIS — Z5900 Homelessness unspecified: Secondary | ICD-10-CM

## 2019-10-04 NOTE — ED Notes (Signed)
Hourly rounding reveals patient sleeping in hall bed. No complaints, stable, in no acute distress. Q15 minute rounds and monitoring via Rover and Officer to continue.  

## 2019-10-04 NOTE — Social Work (Signed)
TOC CM/SW consult received 0856 due to homelessness.  Chart review and assessment in progress.   Berenice Bouton, MSW, LCSW  [phone] 8am-6pm (weekends)

## 2019-10-04 NOTE — Consult Note (Signed)
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  He reports he is ready to go and declines detox or rehab at this time.  Minimizes his alcohol use and reports his drinking with friends last night when he began hearing voices.  Denies hearing voices at this time or visual hallucinations.  No suicidal/homicidal ideations, paranoia, mania, or other concerning psychiatric issues.  Low level of anxiety related to wanting to discharge.  He lives with a friend and will return living there.  Encouraged to use outpatient resources provided and discharge instructions at [facility].  Psychiatrically stable for discharge.  HPI per [facility] Assessment, Ian Malkin: Tristan Cook is an 59 y.o. male who presents to the ER due to hearing voices. Patient states he has heard voices for several years and currently having them. It's unclear if the voices are an increase in frequency and intensity or whether or not they are at baseline. Patient denies SI/HI and V/H. Patient also reports of alcohol use and his last drank was today (10/03/2019). Upon arrival to the ER, his BAC was 383.  Patient is well known within the [place], for similar presentation. He recently was seen at the [facility] and was discharged (09/28/2019). Prior to that he was seen in [facility] ED (09/27/2019) for arm pain. However, upon discharged, he contacted 911 voicing A/H and was transported to [facility].  Total Time spent with patient: 45 minutes  Past Psychiatric History: alcohol abuse, anxiety  Past Medical History:  Past Medical History:  Diagnosis Date  . Abdominal  pain   . Adenomatous colon polyp 2008  . Anxiety   . Arthritis   . Back pain   . Bilateral lumbar radiculopathy   . Blurred vision, bilateral   . Chronic back pain   . Constipation   . Depression   . GERD (gastroesophageal reflux disease)   . Gout   . Hemorrhoids   . High cholesterol   . Hypertension   . Neck pain   . Neuropathy   . Neuropathy of both feet   . Numbness    rectal  . Numbness of legs   . Stroke ([place]) 2019  . Substance abuse ([place])    h/o excessive alcohol use; pt says he limits use to one to 2 beers daily he currently    Past Surgical History:  Procedure Laterality Date  . CATARACT EXTRACTION W/PHACO Right 08/16/2018   Procedure: CATARACT EXTRACTION PHACO AND INTRAOCULAR LENS PLACEMENT RIGHT EYE;  Surgeon: Baruch Goldmann, MD;  Location: [facility];  Service: Ophthalmology;  Laterality: Right;  CDE: 4.18  . COLONOSCOPY  10/09/2006   3 mm pedunculated sigmoid colon polyp removed/8-mm sessile hepatic flexure polyp (tubular villous adenoma) removed/6-mm  descending colon polyp removed/small internal hemorrhoids  . COLONOSCOPY  02/28/2011   OVF:IEPPIR, multiple in the rectum/Internal hemorrhoids, MODERATE-CAUSING RECTAL BLEEDING  . COLONOSCOPY N/A 07/05/2016   Procedure: COLONOSCOPY;  Surgeon: [name], MD;  [facility];  Service: Endoscopy;  Laterality: N/A;  11:15am  . 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 Mild erosive gastrtitis most likely due to indomethacin   . FINGER  SURGERY Right    4th and 5th digit  . FLEXIBLE SIGMOIDOSCOPY  01/02/2012   SLF: 1. the colonic mucosa appeared normal in the sigmoid colon 2. Large internal hemorrhoids: cause for rectal bleeding/pain . s/p banding X 3   . HAND SURGERY    . REVERSE SHOULDER ARTHROPLASTY Left 05/29/2018   Procedure: REVERSE SHOULDER ARTHROPLASTY;  Surgeon: Hiram Gash, MD;  Location: [place];  Service: Orthopedics;  Laterality: Left;  .  SAVORY DILATION N/A 07/21/2014   Procedure: SAVORY DILATION;  Surgeon: [name], MD;  [facility];  Service: Endoscopy;  Laterality: N/A;   Family History:  Family History  Problem Relation Age of Onset  . Hypertension Mother   . Hypertension Father   . Stroke Father   . Hypertension Sister   . Hypertension Brother   . Cancer Brother   . Cancer Brother        throat cancer  . Hypertension Brother   . Diabetes Maternal Grandmother   . Colon cancer Neg Hx    Family Psychiatric  History: none Social History:  Social History   Substance and Sexual Activity  Alcohol Use Yes  . Alcohol/week: 6.0 standard drinks  . Types: 6 Cans of beer per week   Comment: six pack beer daily     Social History   Substance and Sexual Activity  Drug Use No    Social History   Socioeconomic History  . Marital status: Single    Spouse name: Not on file  . Number of children: 0  . Years of education: 48  . Highest education level: Not on file  Occupational History  . Occupation: unemployed, Retail buyer: UNEMPLOYED  Tobacco Use  . Smoking status: Former Smoker    Quit date: 08/16/2004    Years since quitting: 15.1  . Smokeless tobacco: Current User  . Tobacco comment: no tobacco, has quit long time ago  Vaping Use  . Vaping Use: Never used  Substance and Sexual Activity  . Alcohol use: Yes    Alcohol/week: 6.0 standard drinks    Types: 6 Cans of beer per week    Comment: six pack beer daily  . Drug use: No  . Sexual activity: Not on file  Other Topics Concern  . Not on file  Social History Narrative   Lives alone   No caffeine   Right handed   Social Determinants of Health   Financial Resource Strain:   . Difficulty of Paying Living Expenses:   Food Insecurity:   . Worried About Charity fundraiser in the Last Year:   . Arboriculturist in the Last Year:   Transportation Needs:   . Film/video editor (Medical):   [name] Lack of Transportation  (Non-Medical):   Physical Activity:   . Days of Exercise per Week:   . Minutes of Exercise per Session:   Stress:   . Feeling of Stress :   Social Connections:   . Frequency of Communication with Friends and Family:   . Frequency of Social Gatherings with Friends and Family:   . Attends Religious Services:   . Active Member of Clubs or Organizations:   . Attends Archivist Meetings:   [name] Marital Status:     Has this patient used any form of tobacco in the last 30 days? (Cigarettes, Smokeless Tobacco, Cigars, and/or Pipes) A prescription for an FDA-approved tobacco cessation medication was offered at discharge  and the patient refused  Current Medications: No current facility-administered medications for this encounter.   Current Outpatient Medications  Medication Sig Dispense Refill  . aspirin EC 81 MG tablet Take 1 tablet (81 mg total) by mouth daily. 30 tablet 1  . atorvastatin (LIPITOR) 20 MG tablet Take 20 mg by mouth daily.     [name] gabapentin (NEURONTIN) 800 MG tablet Take 800 mg by mouth 3 (three) times daily.     . metoprolol tartrate (LOPRESSOR) 25 MG tablet Take 0.5 tablets (12.5 mg total) by mouth 2 (two) times daily. 30 tablet 0  . tetrahydrozoline-zinc (VISINE-AC) 0.05-0.25 % ophthalmic solution Place 2 drops into both eyes 3 (three) times daily as needed.     PTA Medications: (Not in a hospital admission)   Musculoskeletal: Strength & Muscle Tone: within normal limits Gait & Station: normal Patient leans: N/A  Psychiatric Specialty Exam: Physical Exam Vitals and nursing note reviewed.  Constitutional:      Appearance: Normal appearance.  HENT:     Head: Normocephalic.     Nose: Nose normal.  Pulmonary:     Effort: Pulmonary effort is normal.  Musculoskeletal:        General: Normal range of motion.     Cervical back: Normal range of motion.  Neurological:     General: No focal deficit present.     Mental Status: He is alert and oriented to  person, place, and time.  Psychiatric:        Attention and Perception: Attention and perception normal.        Mood and Affect: Affect normal. Mood is anxious.        Speech: Speech normal.        Behavior: Behavior normal. Behavior is cooperative.        Thought Content: Thought content normal.        Cognition and Memory: Cognition and memory normal.        Judgment: Judgment normal.     Review of Systems  Psychiatric/Behavioral: The patient is nervous/anxious.   All other systems reviewed and are negative.   Blood pressure (!) 142/93, pulse 88, temperature 98.9 F (37.2 C), temperature source Oral, resp. rate 17, SpO2 98 %.There is no height or weight on file to calculate BMI.  General Appearance: Casual  Eye Contact:  Good  Speech:  Normal Rate  Volume:  Normal  Mood:  Anxious  Affect:  Congruent  Thought Process:  Coherent and Descriptions of Associations: Intact  Orientation:  Full (Time, Place, and Person)  Thought Content:  WDL and Logical  Suicidal Thoughts:  No  Homicidal Thoughts:  No  Memory:  Immediate;   Good Recent;   Good Remote;   Good  Judgement:  Fair  Insight:  Fair  Psychomotor Activity:  Normal  Concentration:  Concentration: Good and Attention Span: Good  Recall:  Good  Fund of Knowledge:  Good  Language:  Good  Akathisia:  No  Handed:  Right  AIMS (if indicated):     Assets:  Housing Leisure Time Physical Health Resilience Social Support  ADL's:  Intact  Cognition:  WNL  Sleep:        Demographic Factors:  Male  Loss Factors: NA  Historical Factors: NA  Risk Reduction Factors:   Sense of responsibility to family, Living with another person, especially a relative and Positive social support  Continued Clinical Symptoms:  Anxiety, mild  Cognitive Features That Contribute To Risk:  None  Suicide Risk:  Minimal: No identifiable suicidal ideation.  Patients presenting with no risk factors but with morbid ruminations; may be  classified as minimal risk based on the severity of the depressive symptoms   Follow-up Information    Schedule an appointment as soon as possible for a visit  with [place], [facility].   Contact information: [place] [address] [phone]        Schedule an appointment as soon as possible for a visit  with [place]   Contact information: [place] [address] [phone]               Plan Of Care/Follow-up recommendations:  Alcohol intoxication: -follow up with [facility], information provided in discharge instructions. --refrain from alcohol and drug use -attend 12-step program with a sponsor  Activity:  as tolerated Diet:  heart healthy diet  Disposition: discharge home Waylan Boga, NP 10/04/2019, 9:53 AM

## 2019-10-04 NOTE — ED Provider Notes (Signed)
-----------------------------------------   9:49 AM on 10/04/2019 -----------------------------------------  Patient has been seen and evaluated by psychiatry they believe the patient safe for discharge home from psychiatric standpoint.  Patient's medical work-up is been largely nonrevealing besides an elevated alcohol level upon arrival.  Patient appears well, is eating breakfast without any issue.  Patient is calling for a ride home.  We will discharge the patient shortly.   Harvest Dark, MD 10/04/19 [phone]

## 2019-10-04 NOTE — Consult Note (Addendum)
[facility] Consult   Reason for Consult:  Psych evaluation  Referring Physician:   Patient Identification: Tristan Cook MRN:  [id number] Principal Diagnosis: <principal problem not specified> Diagnosis:  Active Problems:   Alcohol abuse   Alcohol use disorder, severe, dependence ([place])   Homelessness   Total Time spent with patient: 30 minutes  Subjective:  " I have to get myself together"   HPI:  Tristan Cook, 59 y.o., male patient seen via telepsych by this provider; chart reviewed and consulted with Dr. Dwyane Dee on 10/04/19.  On evaluation [name] reports that he is here to get his life together. He states that his mind is all over the place. He was recently seen at [facility] for audio hallucinations. He states that the voice are not command but instead they tell  him they are going to hurt him.  Patient BAL is 383 at the time of assessment.  Patient denies suicidal and homicidal ideations.  Patient denies history of suicide attempts, denies history of self-harm behaviors.  Patient denies visual hallucinations.  Patient reports intermittent auditory hallucinations that "saying they are going to hurt me." Patient denies symptoms of paranoia.  Patient does not appear to be responding to internal stimuli. Patient reports he is currently homeless.   During evaluation Tristan Cook is laying in bed; he is alert/oriented x 3; calm/disoriented/cooperative; and mood congruent with affect.  Patient is speaking in a slurred tone at moderate volume, and slowed pace; with poor eye contact.  His thought process is coherent and relevant; Patient has remained calm throughout assessment.      Past Psychiatric History: Alcohol abuse  Risk to Self: Suicidal Ideation: No Suicidal Intent: No Is patient at risk for suicide?: No Suicidal Plan?: No Access to Means: No What has been your use of drugs/alcohol within the last 12 months?: Alcohol How many times?: 0 Other Self  Harm Risks: Alcohol Abuse Triggers for Past Attempts: None known Intentional Self Injurious Behavior: None Risk to Others: Homicidal Ideation: No Thoughts of Harm to Others: No Current Homicidal Intent: No Current Homicidal Plan: No Access to Homicidal Means: No Identified Victim: Reports of none History of harm to others?: No Assessment of Violence: None Noted Violent Behavior Description: Reports of none Does patient have access to weapons?: No Criminal Charges Pending?: No Does patient have a court date: No Prior Inpatient Therapy: Prior Inpatient Therapy: Yes Prior Therapy Dates: 07 Prior Therapy Facilty/Provider(s): 09/2019, 05/2019 & 06/2018 Reason for Treatment: Hallucinations Prior Outpatient Therapy: Prior Outpatient Therapy: No Does patient have an ACCT team?: No Does patient have Intensive In-House Services?  : No Does patient have [facility] services? : No Does patient have P4CC services?: No  Past Medical History:  Past Medical History:  Diagnosis Date   Abdominal pain    Adenomatous colon polyp 2008   Anxiety    Arthritis    Back pain    Bilateral lumbar radiculopathy    Blurred vision, bilateral    Chronic back pain    Constipation    Depression    GERD (gastroesophageal reflux disease)    Gout    Hemorrhoids    High cholesterol    Hypertension    Neck pain    Neuropathy    Neuropathy of both feet    Numbness    rectal   Numbness of legs    Stroke ([place]) 2019   Substance abuse ([place])    h/o excessive alcohol use; pt says he  limits use to one to 2 beers daily he currently    Past Surgical History:  Procedure Laterality Date   CATARACT EXTRACTION W/PHACO Right 08/16/2018   Procedure: CATARACT EXTRACTION PHACO AND INTRAOCULAR LENS PLACEMENT RIGHT EYE;  Surgeon: Baruch Goldmann, MD;  Location: [facility];  Service: Ophthalmology;  Laterality: Right;  CDE: 4.18   COLONOSCOPY  10/09/2006   3 mm pedunculated sigmoid colon polyp removed/8-mm  sessile hepatic flexure polyp (tubular villous adenoma) removed/6-mm  descending colon polyp removed/small internal hemorrhoids   COLONOSCOPY  02/28/2011   XHB:[id number], multiple in the rectum/Internal hemorrhoids, MODERATE-CAUSING RECTAL BLEEDING   COLONOSCOPY N/A 07/05/2016   Procedure: COLONOSCOPY;  Surgeon: [name], MD;  Location: [facility];  Service: Endoscopy;  Laterality: N/A;  11:15am   ESOPHAGOGASTRODUODENOSCOPY N/A 07/21/2014   SLF: 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 Mild erosive gastrtitis most likely due to indomethacin    FINGER SURGERY Right    4th and 5th digit   FLEXIBLE SIGMOIDOSCOPY  01/02/2012   SLF: 1. the colonic mucosa appeared normal in the sigmoid colon 2. Large internal hemorrhoids: cause for rectal bleeding/pain . s/p banding X 3    HAND SURGERY     REVERSE SHOULDER ARTHROPLASTY Left 05/29/2018   Procedure: REVERSE SHOULDER ARTHROPLASTY;  Surgeon: Hiram Gash, MD;  Location: [facility];  Service: Orthopedics;  Laterality: Left;   SAVORY DILATION N/A 07/21/2014   Procedure: SAVORY DILATION;  Surgeon: [name], MD;  [facility];  Service: Endoscopy;  Laterality: N/A;   Family History:  Family History  Problem Relation Age of Onset   Hypertension Mother    Hypertension Father    Stroke Father    Hypertension Sister    Hypertension Brother    Cancer Brother    Cancer Brother        throat cancer   Hypertension Brother    Diabetes Maternal Grandmother    Colon cancer Neg Hx    Family Psychiatric  History: unknown Social History:  Social History   Substance and Sexual Activity  Alcohol Use Yes   Alcohol/week: 6.0 standard drinks   Types: 6 Cans of beer per week   Comment: six pack beer daily     Social History   Substance and Sexual Activity  Drug Use No    Social History   Socioeconomic History   Marital status: Single    Spouse name: Not on file   Number of children: 0   Years of  education: 12   Highest education level: Not on file  Occupational History   Occupation: unemployed, Retail buyer: UNEMPLOYED  Tobacco Use   Smoking status: Former Smoker    Quit date: 08/16/2004    Years since quitting: 15.1   Smokeless tobacco: Current User   Tobacco comment: no tobacco, has quit long time ago  Vaping Use   Vaping Use: Never used  Substance and Sexual Activity   Alcohol use: Yes    Alcohol/week: 6.0 standard drinks    Types: 6 Cans of beer per week    Comment: six pack beer daily   Drug use: No   Sexual activity: Not on file  Other Topics Concern   Not on file  Social History Narrative   Lives alone   No caffeine   Right handed   Social Determinants of Health   Financial Resource Strain:    Difficulty of Paying Living Expenses:   Food Insecurity:  Worried About Charity fundraiser in the Last Year:    Arboriculturist in the Last Year:   Transportation Needs:    Film/video editor (Medical):    Lack of Transportation (Non-Medical):   Physical Activity:    Days of Exercise per Week:    Minutes of Exercise per Session:   Stress:    Feeling of Stress :   Social Connections:    Frequency of Communication with Friends and Family:    Frequency of Social Gatherings with Friends and Family:    Attends Religious Services:    Active Member of Clubs or Organizations:    Attends Archivist Meetings:    Marital Status:    Additional Social History:    Allergies:   Allergies  Allergen Reactions   Hydrocodone     hallucinations from hydrocodone Other reaction(s): Hallucination hallucinations from hydrocodone   Benadryl [Diphenhydramine Hcl (Sleep)] Other (See Comments)    Palpitations & SOB   Hydrocortisone     Labs:  Results for orders placed or performed during the hospital encounter of 10/03/19 (from the past 48 hour(s))  Comprehensive metabolic panel     Status: Abnormal   Collection Time:  10/03/19  9:39 PM  Result Value Ref Range   Sodium 136 135 - 145 mmol/L   Potassium 3.4 (L) 3.5 - 5.1 mmol/L   Chloride 101 98 - 111 mmol/L   CO2 23 22 - 32 mmol/L   Glucose, Bld 92 70 - 99 mg/dL    Comment: Glucose reference range applies only to samples taken after fasting for at least 8 hours.   BUN 8 6 - 20 mg/dL   Creatinine, Ser 1.02 0.61 - 1.24 mg/dL   Calcium 8.9 8.9 - 10.3 mg/dL   Total Protein 8.0 6.5 - 8.1 g/dL   Albumin 3.9 3.5 - 5.0 g/dL   AST 39 15 - 41 U/L   ALT 33 0 - 44 U/L   Alkaline Phosphatase 60 38 - 126 U/L   Total Bilirubin 0.5 0.3 - 1.2 mg/dL   GFR calc non Af Amer >60 >60 mL/min   GFR calc Af Amer >60 >60 mL/min   Anion gap 12 5 - 15    Comment: Performed at [facility], [address] [place], [place] [address]  Ethanol     Status: Abnormal   Collection Time: 10/03/19  9:39 PM  Result Value Ref Range   Alcohol, Ethyl (B) 383 (HH) <10 mg/dL    Comment: CRITICAL RESULT CALLED TO, READ BACK BY AND VERIFIED WITH GARY LEDUC ON 10/03/19 AT [address] [place] (NOTE) Lowest detectable limit for serum alcohol is 10 mg/dL.  For medical purposes only. Performed at [facility], [address] [place], [place] [address]   Salicylate level     Status: Abnormal   Collection Time: 10/03/19  9:39 PM  Result Value Ref Range   Salicylate Lvl <3.2 (L) 7.0 - 30.0 mg/dL    Comment: Performed at [facility], [address] [place], [place] [address]  Acetaminophen level     Status: Abnormal   Collection Time: 10/03/19  9:39 PM  Result Value Ref Range   Acetaminophen (Tylenol), Serum <10 (L) 10 - 30 ug/mL    Comment: (NOTE) Therapeutic concentrations vary significantly. A range of 10-30 ug/mL  may be an effective concentration for many patients. However, some  are best treated at concentrations outside of this range. Acetaminophen concentrations >150 ug/mL at 4  hours after ingestion  and >50 ug/mL at 12 hours after ingestion are often  associated with  toxic reactions.  Performed at [facility], [address] [place], [place] [address]   cbc     Status: Abnormal   Collection Time: 10/03/19  9:39 PM  Result Value Ref Range   WBC 5.6 4.0 - 10.5 K/uL   RBC 4.17 (L) 4.22 - 5.81 MIL/uL   Hemoglobin 13.7 13.0 - 17.0 g/dL   HCT 38.4 (L) 39 - 52 %   MCV 92.1 80.0 - 100.0 fL   MCH 32.9 26.0 - 34.0 pg   MCHC 35.7 30.0 - 36.0 g/dL   RDW 12.9 11.5 - 15.5 %   Platelets 297 150 - 400 K/uL   nRBC 0.0 0.0 - 0.2 %    Comment: Performed at [facility], [address] [place], [place] [address]  Urine Drug Screen, Qualitative     Status: None   Collection Time: 10/03/19  9:39 PM  Result Value Ref Range   Tricyclic, Ur Screen NONE DETECTED NONE DETECTED   Amphetamines, Ur Screen NONE DETECTED NONE DETECTED   MDMA (Ecstasy)Ur Screen NONE DETECTED NONE DETECTED   Cocaine Metabolite,Ur [place] NONE DETECTED NONE DETECTED   Opiate, Ur Screen NONE DETECTED NONE DETECTED   Phencyclidine (PCP) Ur S NONE DETECTED NONE DETECTED   Cannabinoid 50 Ng, Ur [place] NONE DETECTED NONE DETECTED   Barbiturates, Ur Screen NONE DETECTED NONE DETECTED   Benzodiazepine, Ur Scrn NONE DETECTED NONE DETECTED   Methadone Scn, Ur NONE DETECTED NONE DETECTED    Comment: (NOTE) Tricyclics + metabolites, urine    Cutoff 1000 ng/mL Amphetamines + metabolites, urine  Cutoff 1000 ng/mL MDMA (Ecstasy), urine              Cutoff 500 ng/mL Cocaine Metabolite, urine          Cutoff 300 ng/mL Opiate + metabolites, urine        Cutoff 300 ng/mL Phencyclidine (PCP), urine         Cutoff 25 ng/mL Cannabinoid, urine                 Cutoff 50 ng/mL Barbiturates + metabolites, urine  Cutoff 200 ng/mL Benzodiazepine, urine              Cutoff 200 ng/mL Methadone, urine                   Cutoff 300 ng/mL  The urine drug screen provides only a preliminary, unconfirmed analytical test result and should not be used for non-medical purposes. Clinical  consideration and professional judgment should be applied to any positive drug screen result due to possible interfering substances. A more specific alternate chemical method must be used in order to obtain a confirmed analytical result. Gas chromatography / mass spectrometry (GC/MS) is the preferred confirm atory method. Performed at [facility], [address] [place], [place] [address]     No current facility-administered medications for this encounter.   Current Outpatient Medications  Medication Sig Dispense Refill   aspirin EC 81 MG tablet Take 1 tablet (81 mg total) by mouth daily. 30 tablet 1   atorvastatin (LIPITOR) 20 MG tablet Take 20 mg by mouth daily.      DULoxetine (CYMBALTA) 20 MG capsule Take 1 capsule (20 mg total) by mouth daily. (Patient not taking: Reported on 09/28/2019) 30 capsule 0   furosemide (LASIX) 40 MG tablet Take 40 mg by mouth  daily.      gabapentin (NEURONTIN) 800 MG tablet Take by mouth. (Patient not taking: Reported on 09/28/2019)     metoprolol tartrate (LOPRESSOR) 25 MG tablet Take 0.5 tablets (12.5 mg total) by mouth 2 (two) times daily. 30 tablet 0   Multiple Vitamin (MULTIVITAMIN WITH MINERALS) TABS tablet Take 1 tablet by mouth daily.     pantoprazole (PROTONIX) 40 MG tablet Take 1 tablet (40 mg total) by mouth daily. (Patient not taking: Reported on 09/28/2019) 30 tablet 0   risperiDONE (RISPERDAL) 2 MG tablet Take 1 tablet (2 mg total) by mouth at bedtime. (Patient not taking: Reported on 09/28/2019) 30 tablet 0   tetrahydrozoline 0.05 % ophthalmic solution Place 1 drop into both eyes daily as needed.      Musculoskeletal: Strength & Muscle Tone: within normal limits Gait & Station: normal Patient leans: N/A  Psychiatric Specialty Exam: Physical Exam Vitals and nursing note reviewed.  Constitutional:      Appearance: Normal appearance. He is normal weight.  HENT:     Nose: Nose normal.  Eyes:     Pupils: Pupils are  equal, round, and reactive to light.  Pulmonary:     Effort: Pulmonary effort is normal.  Musculoskeletal:        General: Normal range of motion.     Cervical back: Normal range of motion.  Skin:    General: Skin is warm and dry.  Neurological:     General: No focal deficit present.  Psychiatric:        Attention and Perception: Attention normal.        Mood and Affect: Mood normal.        Speech: Speech is slurred.        Behavior: Behavior is slowed. Behavior is cooperative.        Thought Content: Thought content normal.        Cognition and Memory: Cognition is impaired.        Judgment: Judgment normal.     Review of Systems  All other systems reviewed and are negative.   Blood pressure (!) 140/100, pulse 93, temperature 98.9 F (37.2 C), temperature source Oral, resp. rate 16, SpO2 98 %.There is no height or weight on file to calculate BMI.  General Appearance: Casual  Eye Contact:  Minimal  Speech:  Garbled  Volume:  Normal  Mood:  intoxicated  Affect:  Congruent  Thought Process:  Coherent and Descriptions of Associations: Intact  Orientation:  Full (Time, Place, and Person)  Thought Content:  WDL  Suicidal Thoughts:  No  Homicidal Thoughts:  No  Memory:  Recent;   Fair  Judgement:  Impaired  Insight:  Lacking  Psychomotor Activity:  Decreased  Concentration:  Attention Span: Fair  Recall:  [employer] of Knowledge:  Fair  Language:  Fair  Akathisia:  NA  Handed:  Right  AIMS (if indicated):     Assets:  Resilience  ADL's:  Intact  Cognition:  Impaired,  Mild  Sleep:       Disposition: No evidence of imminent risk to self or others at present.   Patient does not meet criteria for psychiatric inpatient admission. Discussed crisis plan, support from social network, calling 911, coming to the Emergency Department, and calling Suicide Hotline. Consult with social work   Deloria Lair, NP 10/04/2019 12:55 AM

## 2019-10-04 NOTE — ED Notes (Signed)
Pt given breakfast tray

## 2019-10-04 NOTE — ED Notes (Signed)
Hourly rounding reveals patient sleeping in hall. No complaints, stable, in no acute distress. Q15 minute rounds and monitoring via Engineer, drilling to continue.

## 2019-10-04 NOTE — ED Notes (Signed)
Hourly rounding reveals patient sleeping in room. No complaints, stable, in no acute distress. Q15 minute rounds and monitoring via Rover and Officer to continue.  

## 2019-10-04 NOTE — Discharge Instructions (Signed)
[facility] Non-profit organization in [place] Address: [address], [place], [place] [address] Hours:  Closed ? Opens 9AM Mon Phone: [phone]    Alcohol Use Disorder Alcohol use disorder is when your drinking disrupts your daily life. When you have this condition, you drink too much alcohol and you cannot control your drinking. Alcohol use disorder can cause serious problems with your physical health. It can affect your brain, heart, liver, pancreas, immune system, stomach, and intestines. Alcohol use disorder can increase your risk for certain cancers and cause problems with your mental health, such as depression, anxiety, psychosis, delirium, and dementia. People with this disorder risk hurting themselves and others. What are the causes? This condition is caused by drinking too much alcohol over time. It is not caused by drinking too much alcohol only one or two times. Some people with this condition drink alcohol to cope with or escape from negative life events. Others drink to relieve pain or symptoms of mental illness. What increases the risk? You are more likely to develop this condition if:  You have a family history of alcohol use disorder.  Your culture encourages drinking to the point of intoxication, or makes alcohol easy to get.  You had a mood or conduct disorder in childhood.  You have been a victim of abuse.  You are an adolescent and: ? You have poor grades or difficulties in school. ? Your caregivers do not talk to you about saying no to alcohol, or supervise your activities. ? You are impulsive or you have trouble with self-control. What are the signs or symptoms? Symptoms of this condition include:  Drinkingmore than you want to.  Drinking for longer than you want to.  Trying several times to drink less or to control your drinking.  Spending a lot of time getting alcohol, drinking, or recovering from  drinking.  Craving alcohol.  Having problems at work, at school, or at home due to drinking.  Having problems in relationships due to drinking.  Drinking when it is dangerous to drink, such as before driving a car.  Continuing to drink even though you know you might have a physical or mental problem related to drinking.  Needing more and more alcohol to get the same effect you want from the alcohol (building up tolerance).  Having symptoms of withdrawal when you stop drinking. Symptoms of withdrawal include: ? Fatigue. ? Nightmares. ? Trouble sleeping. ? Depression. ? Anxiety. ? Fever. ? Seizures. ? Severe confusion. ? Feeling or seeing things that are not there (hallucinations). ? Tremors. ? Rapid heart rate. ? Rapid breathing. ? High blood pressure.  Drinking to avoid symptoms of withdrawal. How is this diagnosed? This condition is diagnosed with an assessment. Your health care provider may start the assessment by asking three or four questions about your drinking. Your health care provider may perform a physical exam or do lab tests to see if you have physical problems resulting from alcohol use. She or he may refer you to a mental health professional for evaluation. How is this treated? Some people with alcohol use disorder are able to reduce their alcohol use to low-risk levels. Others need to completely quit drinking alcohol. When necessary, mental health professionals with specialized training in substance use treatment can help. Your health care provider can help you decide how severe your alcohol use disorder is and what type of treatment you need. The following forms of treatment are available:  Detoxification. Detoxification involves quitting drinking and using prescription medicines within the first week to help lessen withdrawal symptoms. This treatment is important for people who have had withdrawal symptoms before and for heavy drinkers who are likely to have  withdrawal symptoms. Alcohol withdrawal can be dangerous, and in severe cases, it can cause death. Detoxification may be provided in a home, community, or primary care setting, or in a hospital or substance use treatment facility.  Counseling. This treatment is also called talk therapy. It is provided by substance use treatment counselors. A counselor can address the reasons you use alcohol and suggest ways to keep you from drinking again or to prevent problem drinking. The goals of talk therapy are to: ? Find healthy activities and ways for you to cope with stress. ? Identify and avoid the things that trigger your alcohol use. ? Help you learn how to handle cravings.  Medicines.Medicines can help treat alcohol use disorder by: ? Decreasing alcohol cravings. ? Decreasing the positive feeling you have when you drink alcohol. ? Causing an uncomfortable physical reaction when you drink alcohol (aversion therapy).  Support groups. Support groups are led by people who have quit drinking. They provide emotional support, advice, and guidance. These forms of treatment are often combined. Some people with this condition benefit from a combination of treatments provided by specialized substance use treatment centers. Follow these instructions at home:  Take over-the-counter and prescription medicines only as told by your health care provider.  Check with your health care provider before starting any new medicines.  Ask friends and family members not to offer you alcohol.  Avoid situations where alcohol is served, including gatherings where others are drinking alcohol.  Create a plan for what to do when you are tempted to use alcohol.  Find hobbies or activities that you enjoy that do not include alcohol.  Keep all follow-up visits as told by your health care provider. This is important. How is this prevented?  If you drink, limit alcohol intake to no more than 1 drink a day for nonpregnant  women and 2 drinks a day for men. One drink equals 12 oz of beer, 5 oz of wine, or 1 oz of hard liquor.  If you have a mental health condition, get treatment and support.  Do not give alcohol to adolescents.  If you are an adolescent: ? Do not drink alcohol. ? Do not be afraid to say no if someone offers you alcohol. Speak up about why you do not want to drink. You can be a positive role model for your friends and set a good example for those around you by not drinking alcohol. ? If your friends drink, spend time with others who do not drink alcohol. Make new friends who do not use alcohol. ? Find healthy ways to manage stress and emotions, such as meditation or deep breathing, exercise, spending time in nature, listening to music, or talking with a trusted friend or family member. Contact a health care provider if:  You are not able to take your medicines as told.  Your symptoms get worse.  You return to drinking alcohol (relapse) and your symptoms get worse. Get help right away if:  You have thoughts about hurting yourself or others. If you ever feel like you may hurt yourself or others, or have thoughts about taking your own life, get help right away. You can go to your nearest emergency department or call:  Your local emergency services (911 in the  [place]  A suicide crisis helpline, such as the [place] at (562)278-6028. This is open 24 hours a day. Summary  Alcohol use disorder is when your drinking disrupts your daily life. When you have this condition, you drink too much alcohol and you cannot control your drinking.  Treatment may include detoxification, counseling, medicine, and support groups.  Ask friends and family members not to offer you alcohol. Avoid situations where alcohol is served.  Get help right away if you have thoughts about hurting yourself or others. This information is not intended to replace advice given to you by your health  care provider. Make sure you discuss any questions you have with your health care provider. Document Revised: 02/09/2017 Document Reviewed: 11/25/2015 Elsevier Patient Education  [place].

## 2019-10-04 NOTE — ED Notes (Addendum)
Hourly rounding reveals patient sleeping in hall bed. No complaints, stable, in no acute distress. Q15 minute rounds and monitoring via Rover and Officer to continue.  

## 2019-10-04 NOTE — BH Assessment (Signed)
Assessment Note  Tristan Cook is an 59 y.o. male who presents to the ER due to hearing voices. Patient states he has heard voices for several years and currently having them. It's unclear if the voices are an increase in frequency and intensity or whether or not they are at baseline. Patient denies SI/HI and V/H. Patient also reports of alcohol use and his last drank was today (10/03/2019). Upon arrival to the ER, his BAC was 383.  Patient is well known within the [place], for similar presentation. He recently was seen at the [facility] and was discharged (09/28/2019). Prior to that he was seen in [facility] ED (09/27/2019) for arm pain. However, upon discharged, he contacted 911 voicing A/H and was transported to [facility].  During the interview, the patient was calm, cooperative and pleasant. He was able to provide appropriate answers to the questions. He denies history of violence and aggression. He denies involvement with the legal system and no history of DSS. Throughout the interview, patient denied HI and AV/H.   Diagnosis: Alcohol Use Disorder  Past Medical History:  Past Medical History:  Diagnosis Date  . Abdominal pain   . Adenomatous colon polyp 2008  . Anxiety   . Arthritis   . Back pain   . Bilateral lumbar radiculopathy   . Blurred vision, bilateral   . Chronic back pain   . Constipation   . Depression   . GERD (gastroesophageal reflux disease)   . Gout   . Hemorrhoids   . High cholesterol   . Hypertension   . Neck pain   . Neuropathy   . Neuropathy of both feet   . Numbness    rectal  . Numbness of legs   . Stroke ([place]) 2019  . Substance abuse ([place])    h/o excessive alcohol use; pt says he limits use to one to 2 beers daily he currently    Past Surgical History:  Procedure Laterality Date  . CATARACT EXTRACTION W/PHACO Right 08/16/2018   Procedure: CATARACT EXTRACTION PHACO AND INTRAOCULAR LENS PLACEMENT RIGHT EYE;  Surgeon: Baruch Goldmann, MD;  [facility];  Service: Ophthalmology;  Laterality: Right;  CDE: 4.18  . COLONOSCOPY  10/09/2006   3 mm pedunculated sigmoid colon polyp removed/8-mm sessile hepatic flexure polyp (tubular villous adenoma) removed/6-mm  descending colon polyp removed/small internal hemorrhoids  . COLONOSCOPY  02/28/2011   XTG:GYIRSW, multiple in the rectum/Internal hemorrhoids, MODERATE-CAUSING RECTAL BLEEDING  . COLONOSCOPY N/A 07/05/2016   Procedure: COLONOSCOPY;  Surgeon: [name], MD;  [facility];  Service: Endoscopy;  Laterality: N/A;  11:15am  . 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 Mild erosive gastrtitis most likely due to indomethacin   . FINGER SURGERY Right    4th and 5th digit  . FLEXIBLE SIGMOIDOSCOPY  01/02/2012   SLF: 1. the colonic mucosa appeared normal in the sigmoid colon 2. Large internal hemorrhoids: cause for rectal bleeding/pain . s/p banding X 3   . HAND SURGERY    . REVERSE SHOULDER ARTHROPLASTY Left 05/29/2018   Procedure: REVERSE SHOULDER ARTHROPLASTY;  Surgeon: Hiram Gash, MD;  Location: [facility];  Service: Orthopedics;  Laterality: Left;  . SAVORY DILATION N/A 07/21/2014   Procedure: SAVORY DILATION;  Surgeon: [name], MD;  [facility];  Service: Endoscopy;  Laterality: N/A;    Family History:  Family History  Problem Relation Age of Onset  . Hypertension Mother   .  Hypertension Father   . Stroke Father   . Hypertension Sister   . Hypertension Brother   . Cancer Brother   . Cancer Brother        throat cancer  . Hypertension Brother   . Diabetes Maternal Grandmother   . Colon cancer Neg Hx     Social History:  reports that he quit smoking about 15 years ago. He uses smokeless tobacco. He reports current alcohol use of about 6.0 standard drinks of alcohol per week. He reports that he does not use drugs.  Additional Social History:  Alcohol / Drug Use Pain Medications: See  PTA Prescriptions: See PTA Over the Counter: See PTA History of alcohol / drug use?: Yes Longest period of sobriety (when/how long): Unable quantify Substance #1 Name of Substance 1: Alcohol 1 - Last Use / Amount: 10/03/2019  CIWA: CIWA-Ar BP: (!) 140/100 Pulse Rate: 93 COWS:    Allergies:  Allergies  Allergen Reactions  . Hydrocodone     hallucinations from hydrocodone Other reaction(s): Hallucination hallucinations from hydrocodone  . Benadryl [Diphenhydramine Hcl (Sleep)] Other (See Comments)    Palpitations & SOB  . Hydrocortisone     Home Medications: (Not in a hospital admission)   OB/GYN Status:  No LMP for male patient.  General Assessment Data Location of Assessment: [facility] ED TTS Assessment: In system Is this a Tele or Face-to-Face Assessment?: Face-to-Face Is this an Initial Assessment or a Re-assessment for this encounter?: Initial Assessment Patient Accompanied by:: N/A Language Other than English: No Living Arrangements:  (Private Home) What gender do you identify as?: Male Date Telepsych consult ordered in [facility]: 10/03/19 Time Telepsych consult ordered in [facility]: 2254 Marital status: Single Pregnancy Status: No Living Arrangements: Alone Can pt return to current living arrangement?: Yes Admission Status: Voluntary Is patient capable of signing voluntary admission?: Yes Referral Source: Self/Family/Friend Insurance type: [facility] MCR  Medical Screening Exam ([place]) Medical Exam completed: Yes  Crisis Care Plan Living Arrangements: Alone Legal Guardian: Other: (Self) Name of Psychiatrist: Reports of none Name of Therapist: Reports of none  Education Status Is patient currently in school?: No Is the patient employed, unemployed or receiving disability?: Unemployed  Risk to self with the past 6 months Suicidal Ideation: No Has patient been a risk to self within the past 6 months prior to admission? : No Suicidal Intent: No Has patient had  any suicidal intent within the past 6 months prior to admission? : No Is patient at risk for suicide?: No Suicidal Plan?: No Has patient had any suicidal plan within the past 6 months prior to admission? : No Access to Means: No What has been your use of drugs/alcohol within the last 12 months?: Alcohol Previous Attempts/Gestures: No How many times?: 0 Other Self Harm Risks: Alcohol Abuse Triggers for Past Attempts: None known Intentional Self Injurious Behavior: None Family Suicide History: Unknown Recent stressful life event(s): Other (Comment), Loss (Comment) Persecutory voices/beliefs?: No Depression: No Substance abuse history and/or treatment for substance abuse?: Yes Suicide prevention information given to non-admitted patients: Not applicable  Risk to Others within the past 6 months Homicidal Ideation: No Does patient have any lifetime risk of violence toward others beyond the six months prior to admission? : No Thoughts of Harm to Others: No Current Homicidal Intent: No Current Homicidal Plan: No Access to Homicidal Means: No Identified Victim: Reports of none History of harm to others?: No Assessment of Violence: None Noted Violent Behavior Description: Reports of none Does  patient have access to weapons?: No Criminal Charges Pending?: No Does patient have a court date: No Is patient on probation?: No  Psychosis Hallucinations: Auditory Delusions: None noted  Mental Status Report Appearance/Hygiene: Unremarkable, In scrubs Eye Contact: Fair Motor Activity: Freedom of movement, Unremarkable Speech: Logical/coherent, Unremarkable Level of Consciousness: Alert Mood: Pleasant Affect: Appropriate to circumstance Anxiety Level: None Thought Processes: Coherent, Relevant Judgement: Unimpaired Orientation: Person, Place, Time, Situation, Appropriate for developmental age Obsessive Compulsive Thoughts/Behaviors: None  Cognitive Functioning Concentration:  Normal Memory: Recent Intact, Remote Intact Is patient IDD: No Insight: Fair Impulse Control: Fair Appetite: Good Have you had any weight changes? : No Change Sleep: No Change Total Hours of Sleep: 8 Vegetative Symptoms: None  ADLScreening [facility] Assessment Services) Patient's cognitive ability adequate to safely complete daily activities?: Yes Patient able to express need for assistance with ADLs?: Yes Independently performs ADLs?: Yes (appropriate for developmental age)  Prior Inpatient Therapy Prior Inpatient Therapy: Yes Prior Therapy Dates: 07 Prior Therapy Facilty/Provider(s): 09/2019, 05/2019 & 06/2018 Reason for Treatment: Hallucinations  Prior Outpatient Therapy Prior Outpatient Therapy: No Does patient have an ACCT team?: No Does patient have Intensive In-House Services?  : No Does patient have [facility] services? : No Does patient have P4CC services?: No  ADL Screening (condition at time of admission) Patient's cognitive ability adequate to safely complete daily activities?: Yes Is the patient deaf or have difficulty hearing?: No Does the patient have difficulty seeing, even when wearing glasses/contacts?: No Does the patient have difficulty concentrating, remembering, or making decisions?: No Patient able to express need for assistance with ADLs?: Yes Does the patient have difficulty dressing or bathing?: No Independently performs ADLs?: Yes (appropriate for developmental age) Does the patient have difficulty walking or climbing stairs?: No Weakness of Legs: None Weakness of Arms/Hands: None  Home Assistive Devices/Equipment Home Assistive Devices/Equipment: None  Therapy Consults (therapy consults require a physician order) PT Evaluation Needed: No OT Evalulation Needed: No SLP Evaluation Needed: No Abuse/Neglect Assessment (Assessment to be complete while patient is alone) Abuse/Neglect Assessment Can Be Completed: Yes Physical Abuse: Denies Verbal Abuse:  Denies Sexual Abuse: Denies Exploitation of patient/patient's resources: Denies Self-Neglect: Denies Values / Beliefs Cultural Requests During Hospitalization: None Spiritual Requests During Hospitalization: None Consults Spiritual Care Consult Needed: No Transition of Care Team Consult Needed: No Advance Directives (For Healthcare) Does Patient Have a Medical Advance Directive?: No Would patient like information on creating a medical advance directive?: No - Patient declined  Disposition:  Disposition Initial Assessment Completed for this Encounter: Yes  On Site Evaluation by:   Reviewed with Physician:    Gunnar Fusi MS, LCAS, [facility], [place] Therapeutic Triage Specialist 10/04/2019 12:45 AM

## 2020-01-14 ENCOUNTER — Ambulatory Visit: Payer: Medicare Other | Admitting: Physical Therapy

## 2020-06-24 ENCOUNTER — Emergency Department
Admission: EM | Admit: 2020-06-24 | Discharge: 2020-06-25 | Payer: 59 | Attending: Emergency Medicine | Admitting: Emergency Medicine

## 2020-06-24 ENCOUNTER — Encounter: Payer: Self-pay | Admitting: Emergency Medicine

## 2020-06-24 ENCOUNTER — Other Ambulatory Visit: Payer: Self-pay

## 2020-06-24 DIAGNOSIS — Z79899 Other long term (current) drug therapy: Secondary | ICD-10-CM | POA: Insufficient documentation

## 2020-06-24 DIAGNOSIS — Z20822 Contact with and (suspected) exposure to covid-19: Secondary | ICD-10-CM | POA: Diagnosis not present

## 2020-06-24 DIAGNOSIS — Z87898 Personal history of other specified conditions: Secondary | ICD-10-CM

## 2020-06-24 DIAGNOSIS — Z7982 Long term (current) use of aspirin: Secondary | ICD-10-CM | POA: Insufficient documentation

## 2020-06-24 DIAGNOSIS — I10 Essential (primary) hypertension: Secondary | ICD-10-CM | POA: Diagnosis not present

## 2020-06-24 DIAGNOSIS — F1099 Alcohol use, unspecified with unspecified alcohol-induced disorder: Secondary | ICD-10-CM | POA: Insufficient documentation

## 2020-06-24 DIAGNOSIS — Z87891 Personal history of nicotine dependence: Secondary | ICD-10-CM | POA: Insufficient documentation

## 2020-06-24 LAB — CBC WITH DIFFERENTIAL/PLATELET
Abs Immature Granulocytes: 0.04 10*3/uL (ref 0.00–0.07)
Basophils Absolute: 0 10*3/uL (ref 0.0–0.1)
Basophils Relative: 1 %
Eosinophils Absolute: 0.1 10*3/uL (ref 0.0–0.5)
Eosinophils Relative: 1 %
HCT: 36.9 % — ABNORMAL LOW (ref 39.0–52.0)
Hemoglobin: 13.8 g/dL (ref 13.0–17.0)
Immature Granulocytes: 1 %
Lymphocytes Relative: 27 %
Lymphs Abs: 1.6 10*3/uL (ref 0.7–4.0)
MCH: 31.5 pg (ref 26.0–34.0)
MCHC: 37.4 g/dL — ABNORMAL HIGH (ref 30.0–36.0)
MCV: 84.2 fL (ref 80.0–100.0)
Monocytes Absolute: 0.8 10*3/uL (ref 0.1–1.0)
Monocytes Relative: 13 %
Neutro Abs: 3.4 10*3/uL (ref 1.7–7.7)
Neutrophils Relative %: 57 %
Platelets: 194 10*3/uL (ref 150–400)
RBC: 4.38 MIL/uL (ref 4.22–5.81)
RDW: 13.1 % (ref 11.5–15.5)
WBC: 5.9 10*3/uL (ref 4.0–10.5)
nRBC: 0 % (ref 0.0–0.2)

## 2020-06-24 LAB — URINE DRUG SCREEN, QUALITATIVE (ARMC ONLY)
Amphetamines, Ur Screen: NOT DETECTED
Barbiturates, Ur Screen: NOT DETECTED
Benzodiazepine, Ur Scrn: NOT DETECTED
Cannabinoid 50 Ng, Ur ~~LOC~~: NOT DETECTED
Cocaine Metabolite,Ur ~~LOC~~: NOT DETECTED
MDMA (Ecstasy)Ur Screen: NOT DETECTED
Methadone Scn, Ur: NOT DETECTED
Opiate, Ur Screen: NOT DETECTED
Phencyclidine (PCP) Ur S: NOT DETECTED
Tricyclic, Ur Screen: POSITIVE — AB

## 2020-06-24 LAB — BASIC METABOLIC PANEL
Anion gap: 11 (ref 5–15)
BUN: <5 mg/dL — ABNORMAL LOW (ref 6–20)
CO2: 22 mmol/L (ref 22–32)
Calcium: 9.1 mg/dL (ref 8.9–10.3)
Chloride: 95 mmol/L — ABNORMAL LOW (ref 98–111)
Creatinine, Ser: 0.92 mg/dL (ref 0.61–1.24)
GFR, Estimated: >60 mL/min (ref >60)
Glucose, Bld: 91 mg/dL (ref 70–99)
Potassium: 3.7 mmol/L (ref 3.5–5.1)
Sodium: 128 mmol/L — ABNORMAL LOW (ref 135–145)

## 2020-06-24 LAB — ACETAMINOPHEN LEVEL: Acetaminophen (Tylenol), Serum: <10 ug/mL — ABNORMAL LOW (ref 10–30)

## 2020-06-24 LAB — ETHANOL: Alcohol, Ethyl (B): <10 mg/dL (ref <10)

## 2020-06-24 LAB — RESP PANEL BY RT-PCR (FLU A&B, COVID) ARPGX2
Influenza A by PCR: NEGATIVE
Influenza B by PCR: NEGATIVE
SARS Coronavirus 2 by RT PCR: NEGATIVE

## 2020-06-24 LAB — SALICYLATE LEVEL: Salicylate Lvl: <7 mg/dL — ABNORMAL LOW (ref 7.0–30.0)

## 2020-06-24 MED ORDER — ONDANSETRON 4 MG PO TBDP
ORAL_TABLET | ORAL | Status: AC
  Administered 2020-06-24: 4 mg via ORAL
  Filled 2020-06-24: qty 1

## 2020-06-24 MED ORDER — GABAPENTIN 400 MG PO CAPS
800.0000 mg | ORAL_CAPSULE | Freq: Three times a day (TID) | ORAL | Status: DC
  Filled 2020-06-24 (×2): qty 2
  Filled 2020-06-24: qty 8

## 2020-06-24 MED ORDER — ALUM & MAG HYDROXIDE-SIMETH 200-200-20 MG/5ML PO SUSP
30.0000 mL | Freq: Once | ORAL | Status: AC
  Administered 2020-06-24: 30 mL via ORAL

## 2020-06-24 MED ORDER — ONDANSETRON 4 MG PO TBDP: 4.0000 mg | ORAL_TABLET | Freq: Once | ORAL | Status: AC

## 2020-06-24 MED ORDER — LIDOCAINE VISCOUS HCL 2 % MT SOLN
15.0000 mL | Freq: Once | OROMUCOSAL | Status: AC
  Administered 2020-06-24: 15 mL via ORAL

## 2020-06-24 MED ORDER — METOPROLOL TARTRATE 25 MG PO TABS
12.5000 mg | ORAL_TABLET | Freq: Two times a day (BID) | ORAL | Status: DC
  Administered 2020-06-24: 12.5 mg via ORAL
  Filled 2020-06-24: qty 1

## 2020-06-24 MED ORDER — ASPIRIN EC 81 MG PO TBEC
81.0000 mg | DELAYED_RELEASE_TABLET | Freq: Every day | ORAL | Status: DC
  Administered 2020-06-24: 81 mg via ORAL
  Filled 2020-06-24: qty 1

## 2020-06-24 NOTE — ED Notes (Signed)
Pt noted to be actively vomiting after attempting to drink water. Zofran administered as ordered.

## 2020-06-24 NOTE — ED Notes (Signed)
Patient is resting comfortably. 

## 2020-06-24 NOTE — ED Notes (Signed)
Lunch given to patient.

## 2020-06-24 NOTE — ED Triage Notes (Signed)
Pt comes into the ED via POV c/o alcohol detox.  Pt admits to drinking a 12 pack per day.  Pt states last ETOH was this morning.  Pt ambulatory to exam area and in NAD with even and unlabored respirations.

## 2020-06-24 NOTE — ED Provider Notes (Signed)
[facility] Emergency Department Provider Note  ____________________________________________   I have reviewed the triage vital signs and the nursing notes.   HISTORY  Chief Complaint Alcohol Detox   History limited by: Not Limited   HPI Tristan Cook is a 60 y.o. male who presents to the emergency department today for alcohol detox.  Patient states that he drinks heavily every day.  His last drink was earlier today.  He denies any history of complicated withdrawals.  Denies any current nausea or vomiting.   Records reviewed. Per medical record review patient has a history of alcohol use disorder.  Past Medical History:  Diagnosis Date  . Abdominal pain   . Adenomatous colon polyp 2008  . Anxiety   . Arthritis   . Back pain   . Bilateral lumbar radiculopathy   . Blurred vision, bilateral   . Chronic back pain   . Constipation   . Depression   . GERD (gastroesophageal reflux disease)   . Gout   . Hemorrhoids   . High cholesterol   . Hypertension   . Neck pain   . Neuropathy   . Neuropathy of both feet   . Numbness    rectal  . Numbness of legs   . Stroke ([place]) 2019  . Substance abuse ([place])    h/o excessive alcohol use; pt says he limits use to one to 2 beers daily he currently    Patient Active Problem List   Diagnosis Date Noted  . Homelessness 10/04/2019  . 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 Acute psychosis ([place]) 06/05/2019  . Delusional disorder ([place]) 05/24/2019  . Swelling of limb 03/11/2019  . Peripheral polyneuropathy 02/12/2019  . Alcohol intoxication ([place]) 08/27/2018  . Hallucinations 08/27/2018  . 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 Alcohol use disorder, severe, dependence ([place]) 08/27/2018  . TIA (transient ischemic attack) 05/29/2018  . Syncope 05/29/2018  . Alcohol abuse 05/29/2018  . Fall 05/29/2018  . Dislocation of left shoulder  joint 05/29/2018  . Closed fracture of left proximal humerus 05/29/2018  . Abnormal LFTs 05/29/2018  . Hypokalemia 05/29/2018  . Constipation 10/17/2017  . Hoarseness 10/17/2017  . Weakness 04/11/2017  . Weakness of both legs 04/10/2017  . Vitamin B deficiency 01/22/2017  . 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 Stroke, small vessel ([place]) 01/17/2017  . 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 Vitamin B12 deficiency neuropathy ([place]) 01/17/2017  . Adjustment disorder with depressed mood 08/15/2016  . History of colonic polyps 06/14/2016  . Elevated blood pressure reading 06/14/2016  . Hemorrhoids 09/20/2015  . Depressive disorder 08/03/2015  . Back pain with radiation 06/15/2015  . Insomnia 06/03/2015  . Hyperlipidemia LDL goal <130 04/25/2015  . 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 Fatigue 03/29/2015  . Allergic rhinitis 02/10/2015  . Esophageal stricture 10/22/2014  . Overweight (BMI 25.0-29.9) 10/06/2013  . GERD (gastroesophageal reflux disease) 04/23/2012  . Hemorrhoids, internal 11/02/2011  . Essential hypertension 07/25/2007  . Neck pain on left side 07/25/2007    Past Surgical History:  Procedure Laterality Date  . CATARACT EXTRACTION W/PHACO Right 08/16/2018   Procedure: CATARACT EXTRACTION PHACO AND INTRAOCULAR LENS PLACEMENT RIGHT EYE;  Surgeon: Baruch Goldmann, MD;  Location: [facility];  Service: Ophthalmology;  Laterality: Right;  CDE: 4.18  . COLONOSCOPY  10/09/2006   3 mm pedunculated sigmoid colon polyp removed/8-mm sessile hepatic flexure polyp (tubular villous adenoma) removed/6-mm  descending colon polyp removed/small internal hemorrhoids  .  COLONOSCOPY  02/28/2011   PQZ:RAQTMA, multiple in the rectum/Internal hemorrhoids, MODERATE-CAUSING RECTAL BLEEDING  . COLONOSCOPY N/A 07/05/2016   Procedure: COLONOSCOPY;  Surgeon: [name], MD;  [facility];  Service: Endoscopy;  Laterality: N/A;  11:15am  . ESOPHAGOGASTRODUODENOSCOPY N/A 07/21/2014   SLF:  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 Mild erosive gastrtitis most likely due to indomethacin   . FINGER SURGERY Right    4th and 5th digit  . FLEXIBLE SIGMOIDOSCOPY  01/02/2012   SLF: 1. the colonic mucosa appeared normal in the sigmoid colon 2. Large internal hemorrhoids: cause for rectal bleeding/pain . s/p banding X 3   . HAND SURGERY    . REVERSE SHOULDER ARTHROPLASTY Left 05/29/2018   Procedure: REVERSE SHOULDER ARTHROPLASTY;  Surgeon: Hiram Gash, MD;  Location: [facility];  Service: Orthopedics;  Laterality: Left;  . SAVORY DILATION N/A 07/21/2014   Procedure: SAVORY DILATION;  Surgeon: [name], MD;  [facility];  Service: Endoscopy;  Laterality: N/A;    Prior to Admission medications   Medication Sig Start Date End Date Taking? Authorizing Provider  aspirin EC 81 MG tablet Take 1 tablet (81 mg total) by mouth daily. 09/04/18   Clapacs, Madie Reno, MD  atorvastatin (LIPITOR) 20 MG tablet Take 20 mg by mouth daily.     [provider]  gabapentin (NEURONTIN) 800 MG tablet Take 800 mg by mouth 3 (three) times daily.  08/06/19 08/05/20  [provider]  metoprolol tartrate (LOPRESSOR) 25 MG tablet Take 0.5 tablets (12.5 mg total) by mouth 2 (two) times daily. 06/09/19   Connye Burkitt, NP  tetrahydrozoline-zinc (VISINE-AC) 0.05-0.25 % ophthalmic solution Place 2 drops into both eyes 3 (three) times daily as needed.    [provider]    Allergies Hydrocodone, Benadryl [diphenhydramine hcl (sleep)], and Hydrocortisone  Family History  Problem Relation Age of Onset  . Hypertension Mother   . Hypertension Father   . Stroke Father   . Hypertension Sister   . Hypertension Brother   . Cancer Brother   . Cancer Brother        throat cancer  . Hypertension Brother   . Diabetes Maternal Grandmother   . Colon cancer Neg Hx     Social History Social History   Tobacco Use  . Smoking status: Former Smoker    Quit date: 08/16/2004     Years since quitting: 15.8  . Smokeless tobacco: Current User  . Tobacco comment: no tobacco, has quit long time ago  Vaping Use  . Vaping Use: Never used  Substance Use Topics  . Alcohol use: Yes    Alcohol/week: 6.0 standard drinks    Types: 6 Cans of beer per week    Comment: six pack beer daily  . Drug use: No    Review of Systems Constitutional: No fever/chills Eyes: No visual changes. ENT: No sore throat. Cardiovascular: Denies chest pain. Respiratory: Denies shortness of breath. Gastrointestinal: No abdominal pain.  No nausea, no vomiting.  No diarrhea.   Genitourinary: Negative for dysuria. Musculoskeletal: Negative for back pain. Skin: Negative for rash. Neurological: Negative for headaches, focal weakness or numbness.  ____________________________________________   PHYSICAL EXAM:  VITAL SIGNS: ED Triage Vitals  Enc Vitals Group     BP 06/24/20 1203 (!) 151/97     Pulse Rate 06/24/20 1203 99     Resp 06/24/20 1203 17     Temp 06/24/20 1203 99 F (37.2 C)  Temp Source 06/24/20 1203 Oral     SpO2 06/24/20 1203 100 %     Weight 06/24/20 1202 180 lb (81.6 kg)     Height 06/24/20 1202 6\' 2"  (1.88 m)     Head Circumference --      Peak Flow --      Pain Score 06/24/20 1202 10   Constitutional: Alert and oriented.  Eyes: Conjunctivae are normal.  ENT      Head: Normocephalic and atraumatic.      Nose: No congestion/rhinnorhea.      Mouth/Throat: Mucous membranes are moist.      Neck: No stridor. Hematological/Lymphatic/Immunilogical: No cervical lymphadenopathy. Cardiovascular: Normal rate, regular rhythm.  No murmurs, rubs, or gallops.  Respiratory: Normal respiratory effort without tachypnea nor retractions. Breath sounds are clear and equal bilaterally. No wheezes/rales/rhonchi. Gastrointestinal: Soft and non tender. No rebound. No guarding.  Genitourinary: Deferred Musculoskeletal: Normal range of motion in all extremities. No lower extremity  edema. Neurologic:  Normal speech and language. No gross focal neurologic deficits are appreciated.  Skin:  Skin is warm, dry and intact. No rash noted. Psychiatric: Mood and affect are normal. Speech and behavior are normal. Patient exhibits appropriate insight and judgment.  ____________________________________________    LABS (pertinent positives/negatives)  None  ____________________________________________   EKG  None  ____________________________________________    RADIOLOGY  None  ____________________________________________   PROCEDURES  Procedures  ____________________________________________   INITIAL IMPRESSION / ASSESSMENT AND PLAN / ED COURSE  Pertinent labs & imaging results that were available during my care of the patient were reviewed by me and considered in my medical decision making (see chart for details).   Patient presented to the emergency department today for help with alcohol detox.  Patient states he is interested in an inpatient detox facility.  He denies any history of complicated withdrawals.  Currently not exhibiting any withdrawal type symptoms.  Will have TTS evaluate to see about detox placement.   ____________________________________________   FINAL CLINICAL IMPRESSION(S) / ED DIAGNOSES  Final diagnoses:  History of alcohol use disorder     Note: This dictation was prepared with Dragon dictation. Any transcriptional errors that result from this process are unintentional     Nance Pear, MD 06/24/20 1353

## 2020-06-24 NOTE — ED Notes (Signed)
Pt updated on disposition plan. Pt verbalizes understanding of plan at this time. No further questions at this time.

## 2020-06-24 NOTE — BH Assessment (Signed)
Patient has been accepted to [facility].  Patient assigned [facility] Accepting physician is Dr. Jonelle Sports.  Call report to [phone].  Representative was [employer].   ER Staff is aware of it:  [place], ER Secretary  Dr. Joni Fears, ER MD  Deneise Lever, Patient's Nurse  Bed available tomorrow (06/25/2020) after 8am.  Address: [address] [place], [place] [address]

## 2020-06-24 NOTE — BH Assessment (Signed)
Comprehensive Clinical Assessment (CCA) Screening, Triage and Referral Note  06/24/2020 LAMAJ METOYER [id number]  Oley L. Quintela is a 60 year old male who presents to the ER seeking assistance for his alcohol use. He states he drinks a 12 pack of beer a day. This has been ongoing for several years. The longest he has been sober is approximately a week and that is when he was inpatient with [facility]. When he was inpatient with [facility] it was for mental health reasons. With this visit it is solely alcohol abuse and detox.  Patients further report having no thoughts of suicide or homicidal ideations. He also denies auditory and visual hallucinations. No current involvement with the legal system and no history of aggression or violence.   Chief Complaint:  Chief Complaint  Patient presents with  . Alcohol Detox   Visit Diagnosis: Alcohol Use Disorder  Patient Reported Information How did you hear about [place]? Self   Referral name: Self   Referral phone number: No data recorded Whom do you see for routine medical problems? I don't have a doctor   Practice/Facility Name: No data recorded  Practice/Facility Phone Number: No data recorded  Name of Contact: No data recorded  Contact Number: No data recorded  Contact Fax Number: No data recorded  Prescriber Name: No data recorded  Prescriber Address (if known): No data recorded What Is the Reason for Your Visit/Call Today? Seeking alcohol detox treatment  How Long Has This Been Causing You Problems? > than 6 months  Have You Recently Been in Any Inpatient Treatment (Hospital/Detox/Crisis Center/28-Day Program)? No   Name/Location of Program/Hospital:[facility] in 06/05/19   How Long Were You There? See Epic chart   When Were You Discharged? 06/09/2019  Have You Ever Received Services From [employer] Before? No   Who Do You See at [facility]? [place]  Have You Recently Had Any Thoughts About Hurting Yourself? No   Are You Planning to Commit  Suicide/Harm Yourself At This time?  No  Have you Recently Had Thoughts About [place]? No   Explanation: No data recorded Have You Used Any Alcohol or Drugs in the Past 24 Hours? Yes   How Long Ago Did You Use Drugs or Alcohol?  1700   What Did You Use and How Much? Alcohol  What Do You Feel Would Help You the Most Today? Alcohol or Drug Use Treatment  Do You Currently Have a Therapist/Psychiatrist? No   Name of Therapist/Psychiatrist: No data recorded  Have You Been Recently Discharged From Any Office Practice or Programs? No   Explanation of Discharge From Practice/Program:  No data recorded    CCA Screening Triage Referral Assessment Type of Contact: Face-to-Face   Is this Initial or Reassessment? No data recorded  Date Telepsych consult ordered in [facility]:  10/03/2019   Time Telepsych consult ordered in [facility]:  2254  Patient Reported Information Reviewed? Yes   Patient Left Without Being Seen? No data recorded  Reason for Not Completing Assessment: No data recorded Collateral Involvement: n/a  Does Patient Have a Court Appointed Legal Guardian? No data recorded  Name and Contact of Legal Guardian:  Self  If Minor and Not Living with Parent(s), Who has Custody? No data recorded Is CPS involved or ever been involved? Never  Is APS involved or ever been involved? Never  Patient Determined To Be At Risk for Harm To Self or Others Based on Review of Patient Reported Information or Presenting Complaint? No   Method:  No data recorded  Availability of Means: No data recorded  Intent: No data recorded  Notification Required: No data recorded  Additional Information for Danger to Others Potential:  No data recorded  Additional Comments for Danger to Others Potential:  No data recorded  Are There Guns or Other Weapons in Your Home?  No data recorded   Types of Guns/Weapons: No data recorded   Are These Weapons Safely Secured?                              No data  recorded   Who Could Verify You Are Able To Have These Secured:    No data recorded Do You Have any Outstanding Charges, Pending Court Dates, Parole/Probation? No data recorded Contacted To Inform of Risk of Harm To Self or Others: No data recorded Location of Assessment: [facility] ED  Does Patient Present under Involuntary Commitment? No   IVC Papers Initial File Date: No data recorded  [place] of Residence: [place]  Patient Currently Receiving the Following Services: Not Receiving Services   Determination of Need: Emergent (2 hours)   Options For Referral: Other: Comment  Gunnar Fusi MS, LCAS, [facility], [place] Therapeutic Triage Specialist 06/24/2020 3:15 PM

## 2020-06-24 NOTE — ED Notes (Signed)
Pt uses cane to get around; and walker for longer distances; pt had a 16ox beer this am; pt states he went through detox many years ago

## 2020-06-24 NOTE — ED Notes (Signed)
Pt provided with meal tray at this time. Pt voices no concern.

## 2020-06-24 NOTE — ED Notes (Signed)
Report given to Annie RN 

## 2020-06-24 NOTE — ED Notes (Signed)
VOL, pend placement 

## 2020-06-25 MED ORDER — TRAZODONE HCL 50 MG PO TABS
50.0000 mg | ORAL_TABLET | Freq: Every evening | ORAL | Status: DC | PRN
  Administered 2020-06-25: 50 mg via ORAL
  Filled 2020-06-25: qty 1

## 2020-06-25 NOTE — ED Provider Notes (Signed)
Emergency Medicine Observation Re-evaluation Note  LYNDELL ALLAIRE is a 60 y.o. male, seen on rounds today.  Pt initially presented to the ED for complaints of Alcohol Detox Currently, the patient is resting comfortably.  Physical Exam  BP 130/90 (BP Location: Left Arm)   Pulse 87   Temp 98.2 F (36.8 C) (Oral)   Resp 18   Ht 6\' 2"  (1.88 m)   Wt 81.6 kg   SpO2 100%   BMI 23.11 kg/m  Physical Exam Gen: No acute distress  Resp: Normal rise and fall of chest Neuro: Moving all four extremities Psych: Resting currently, calm and cooperative when awake    ED Course / MDM  EKG:   I have reviewed the labs performed to date as well as medications administered while in observation.  Recent changes in the last 24 hours include no acute events overnight.  Plan  Current plan is for transfer to [facility] in the morning. Patient is not under full IVC at this time.   Duchess Armendarez, Delice Bison, DO 06/25/20 [phone]

## 2020-07-18 ENCOUNTER — Encounter: Payer: Self-pay | Admitting: Intensive Care

## 2020-07-18 ENCOUNTER — Emergency Department
Admission: EM | Admit: 2020-07-18 | Discharge: 2020-07-19 | Disposition: A | Discharge status: Home / Self Care | Payer: 59 | Attending: Emergency Medicine | Admitting: Emergency Medicine

## 2020-07-18 ENCOUNTER — Other Ambulatory Visit: Payer: Self-pay

## 2020-07-18 DIAGNOSIS — I10 Essential (primary) hypertension: Secondary | ICD-10-CM | POA: Diagnosis not present

## 2020-07-18 DIAGNOSIS — Z20822 Contact with and (suspected) exposure to covid-19: Secondary | ICD-10-CM | POA: Diagnosis not present

## 2020-07-18 DIAGNOSIS — Z7982 Long term (current) use of aspirin: Secondary | ICD-10-CM | POA: Diagnosis not present

## 2020-07-18 DIAGNOSIS — Y907 Blood alcohol level of 200-239 mg/100 ml: Secondary | ICD-10-CM | POA: Insufficient documentation

## 2020-07-18 DIAGNOSIS — Z5900 Homelessness unspecified: Secondary | ICD-10-CM | POA: Diagnosis not present

## 2020-07-18 DIAGNOSIS — F10239 Alcohol dependence with withdrawal, unspecified: Secondary | ICD-10-CM | POA: Diagnosis not present

## 2020-07-18 DIAGNOSIS — Z79899 Other long term (current) drug therapy: Secondary | ICD-10-CM | POA: Diagnosis not present

## 2020-07-18 DIAGNOSIS — E785 Hyperlipidemia, unspecified: Secondary | ICD-10-CM | POA: Insufficient documentation

## 2020-07-18 DIAGNOSIS — Z96612 Presence of left artificial shoulder joint: Secondary | ICD-10-CM | POA: Insufficient documentation

## 2020-07-18 DIAGNOSIS — F19239 Other psychoactive substance dependence with withdrawal, unspecified: Secondary | ICD-10-CM | POA: Insufficient documentation

## 2020-07-18 DIAGNOSIS — F10229 Alcohol dependence with intoxication, unspecified: Secondary | ICD-10-CM | POA: Diagnosis not present

## 2020-07-18 DIAGNOSIS — Z8673 Personal history of transient ischemic attack (TIA), and cerebral infarction without residual deficits: Secondary | ICD-10-CM | POA: Insufficient documentation

## 2020-07-18 DIAGNOSIS — F1010 Alcohol abuse, uncomplicated: Secondary | ICD-10-CM

## 2020-07-18 DIAGNOSIS — Z87891 Personal history of nicotine dependence: Secondary | ICD-10-CM | POA: Insufficient documentation

## 2020-07-18 DIAGNOSIS — F333 Major depressive disorder, recurrent, severe with psychotic symptoms: Secondary | ICD-10-CM | POA: Insufficient documentation

## 2020-07-18 DIAGNOSIS — F10288 Alcohol dependence with other alcohol-induced disorder: Secondary | ICD-10-CM | POA: Diagnosis present

## 2020-07-18 LAB — CBC WITH DIFFERENTIAL/PLATELET
Abs Immature Granulocytes: 0.01 10*3/uL (ref 0.00–0.07)
Basophils Absolute: 0 10*3/uL (ref 0.0–0.1)
Basophils Relative: 1 %
Eosinophils Absolute: 0 10*3/uL (ref 0.0–0.5)
Eosinophils Relative: 1 %
HCT: 37 % — ABNORMAL LOW (ref 39.0–52.0)
Hemoglobin: 13.4 g/dL (ref 13.0–17.0)
Immature Granulocytes: 0 %
Lymphocytes Relative: 29 %
Lymphs Abs: 1.1 10*3/uL (ref 0.7–4.0)
MCH: 31.5 pg (ref 26.0–34.0)
MCHC: 36.2 g/dL — ABNORMAL HIGH (ref 30.0–36.0)
MCV: 86.9 fL (ref 80.0–100.0)
Monocytes Absolute: 0.4 10*3/uL (ref 0.1–1.0)
Monocytes Relative: 11 %
Neutro Abs: 2.1 10*3/uL (ref 1.7–7.7)
Neutrophils Relative %: 58 %
Platelets: 271 10*3/uL (ref 150–400)
RBC: 4.26 MIL/uL (ref 4.22–5.81)
RDW: 14.9 % (ref 11.5–15.5)
WBC: 3.7 10*3/uL — ABNORMAL LOW (ref 4.0–10.5)
nRBC: 0 % (ref 0.0–0.2)

## 2020-07-18 LAB — URINE DRUG SCREEN, QUALITATIVE (ARMC ONLY)
Amphetamines, Ur Screen: NOT DETECTED
Barbiturates, Ur Screen: NOT DETECTED
Benzodiazepine, Ur Scrn: NOT DETECTED
Cannabinoid 50 Ng, Ur ~~LOC~~: NOT DETECTED
Cocaine Metabolite,Ur ~~LOC~~: NOT DETECTED
MDMA (Ecstasy)Ur Screen: NOT DETECTED
Methadone Scn, Ur: NOT DETECTED
Opiate, Ur Screen: NOT DETECTED
Phencyclidine (PCP) Ur S: NOT DETECTED
Tricyclic, Ur Screen: NOT DETECTED

## 2020-07-18 LAB — COMPREHENSIVE METABOLIC PANEL
ALT: 16 U/L (ref 0–44)
AST: 43 U/L — ABNORMAL HIGH (ref 15–41)
Albumin: 3.5 g/dL (ref 3.5–5.0)
Alkaline Phosphatase: 68 U/L (ref 38–126)
Anion gap: 11 (ref 5–15)
BUN: <5 mg/dL — ABNORMAL LOW (ref 6–20)
CO2: 22 mmol/L (ref 22–32)
Calcium: 8.6 mg/dL — ABNORMAL LOW (ref 8.9–10.3)
Chloride: 106 mmol/L (ref 98–111)
Creatinine, Ser: 0.9 mg/dL (ref 0.61–1.24)
GFR, Estimated: >60 mL/min (ref >60)
Glucose, Bld: 98 mg/dL (ref 70–99)
Potassium: 3.9 mmol/L (ref 3.5–5.1)
Sodium: 139 mmol/L (ref 135–145)
Total Bilirubin: 0.8 mg/dL (ref 0.3–1.2)
Total Protein: 7.5 g/dL (ref 6.5–8.1)

## 2020-07-18 LAB — RESP PANEL BY RT-PCR (FLU A&B, COVID) ARPGX2
Influenza A by PCR: NEGATIVE
Influenza B by PCR: NEGATIVE
SARS Coronavirus 2 by RT PCR: NEGATIVE

## 2020-07-18 LAB — ACETAMINOPHEN LEVEL: Acetaminophen (Tylenol), Serum: <10 ug/mL — ABNORMAL LOW (ref 10–30)

## 2020-07-18 LAB — ETHANOL: Alcohol, Ethyl (B): 221 mg/dL — ABNORMAL HIGH (ref <10)

## 2020-07-18 LAB — MAGNESIUM: Magnesium: 2 mg/dL (ref 1.7–2.4)

## 2020-07-18 LAB — SALICYLATE LEVEL: Salicylate Lvl: <7 mg/dL — ABNORMAL LOW (ref 7.0–30.0)

## 2020-07-18 MED ORDER — HYDROXYZINE PAMOATE 25 MG PO CAPS
25.0000 mg | ORAL_CAPSULE | Freq: Every evening | ORAL | Status: DC | PRN
  Filled 2020-07-18: qty 1

## 2020-07-18 MED ORDER — CHLORDIAZEPOXIDE HCL 25 MG PO CAPS
25.0000 mg | ORAL_CAPSULE | Freq: Once | ORAL | Status: AC
  Administered 2020-07-18: 25 mg via ORAL
  Filled 2020-07-18: qty 1

## 2020-07-18 MED ORDER — LORAZEPAM 2 MG/ML IJ SOLN: 0.0000 mg | Freq: Four times a day (QID) | INTRAMUSCULAR | Status: DC

## 2020-07-18 MED ORDER — THIAMINE HCL 100 MG/ML IJ SOLN: 100.0000 mg | Freq: Every day | INTRAMUSCULAR | Status: DC

## 2020-07-18 MED ORDER — LORAZEPAM 2 MG PO TABS: 0.0000 mg | ORAL_TABLET | Freq: Two times a day (BID) | ORAL | Status: DC

## 2020-07-18 MED ORDER — GABAPENTIN 300 MG PO CAPS
800.0000 mg | ORAL_CAPSULE | Freq: Three times a day (TID) | ORAL | Status: DC
  Filled 2020-07-18 (×2): qty 2

## 2020-07-18 MED ORDER — METOPROLOL TARTRATE 25 MG PO TABS
25.0000 mg | ORAL_TABLET | Freq: Two times a day (BID) | ORAL | Status: DC
  Administered 2020-07-18 – 2020-07-19 (×3): 25 mg via ORAL
  Filled 2020-07-18 (×3): qty 1

## 2020-07-18 MED ORDER — ATORVASTATIN CALCIUM 20 MG PO TABS
20.0000 mg | ORAL_TABLET | Freq: Every day | ORAL | Status: DC
  Administered 2020-07-18 – 2020-07-19 (×2): 20 mg via ORAL
  Filled 2020-07-18 (×2): qty 1

## 2020-07-18 MED ORDER — OLANZAPINE 5 MG PO TABS
7.5000 mg | ORAL_TABLET | Freq: Every day | ORAL | Status: DC
  Administered 2020-07-18 – 2020-07-19 (×2): 7.5 mg via ORAL
  Filled 2020-07-18 (×2): qty 2

## 2020-07-18 MED ORDER — THIAMINE HCL 100 MG PO TABS
100.0000 mg | ORAL_TABLET | Freq: Every day | ORAL | Status: DC
  Administered 2020-07-18 – 2020-07-19 (×2): 100 mg via ORAL
  Filled 2020-07-18 (×2): qty 1

## 2020-07-18 MED ORDER — LORAZEPAM 2 MG PO TABS
0.0000 mg | ORAL_TABLET | Freq: Four times a day (QID) | ORAL | Status: DC
  Administered 2020-07-18 – 2020-07-19 (×5): 1 mg via ORAL
  Filled 2020-07-18 (×5): qty 1

## 2020-07-18 MED ORDER — THIAMINE HCL 100 MG PO TABS: 50.0000 mg | ORAL_TABLET | Freq: Every day | ORAL | Status: DC

## 2020-07-18 MED ORDER — LORAZEPAM 2 MG/ML IJ SOLN: 0.0000 mg | Freq: Two times a day (BID) | INTRAMUSCULAR | Status: DC

## 2020-07-18 NOTE — ED Notes (Signed)
Per [facility], no male beds available at this time. Requesting to call back tomorrow after 10am.

## 2020-07-18 NOTE — ED Notes (Addendum)
PT was given a lunch tray at this time

## 2020-07-18 NOTE — ED Triage Notes (Signed)
Pt reports was supposed to be at [facility] this am for detox from [facility] but his ride did not come so instead people from the hotel brough him here for detox pt  Reports drinks about a 12-pack a day

## 2020-07-18 NOTE — ED Provider Notes (Signed)
[facility] Emergency Department Provider Note ____________________________________________   Event Date/Time   First MD Initiated Contact with Patient 07/18/20 1432     (approximate)  I have reviewed the triage vital signs and the nursing notes.  HISTORY  Chief Complaint detox   HPI Tristan Cook is a 60 y.o. malewho presents to the ED for evaluation of detox  Chart review indicates history of HTN and HLD. Patient reports he is currently homeless and has been living out of a local motel for the past 1 month. Patient reports drinking at least a 12 pack/day of beer for many years.  He cannot recall the last time he did not drink in a day.  Reports he gets through a case in 1-2 days.  Denies additional recreational drugs beyond beer.  Patient reports very good insight to his condition and indicates that he knows he needs help.  He reports calling the [employer] for assistance, and being connected with [facility] rehab facility in [place].  He reports being arranged to be seen at this facility tomorrow morning for evaluation and possible detox as an inpatient at their facility.  He reports he was going to have a friend drive him there this morning, but his friend never arrived.   Past Medical History:  Diagnosis Date  . Abdominal pain   . Adenomatous colon polyp 2008  . Anxiety   . Arthritis   . Back pain   . Bilateral lumbar radiculopathy   . Blurred vision, bilateral   . Chronic back pain   . Constipation   . Depression   . GERD (gastroesophageal reflux disease)   . Gout   . Hemorrhoids   . High cholesterol   . Hypertension   . Neck pain   . Neuropathy   . Neuropathy of both feet   . Numbness    rectal  . Numbness of legs   . Stroke ([place]) 2019  . Substance abuse ([place])    h/o excessive alcohol use; pt says he limits use to one to 2 beers daily he currently    Patient Active Problem List   Diagnosis Date Noted  .  Homelessness 10/04/2019  . 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 Acute psychosis ([place]) 06/05/2019  . Delusional disorder ([place]) 05/24/2019  . Swelling of limb 03/11/2019  . Peripheral polyneuropathy 02/12/2019  . Alcohol intoxication ([place]) 08/27/2018  . Hallucinations 08/27/2018  . 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 Alcohol use disorder, severe, dependence ([place]) 08/27/2018  . TIA (transient ischemic attack) 05/29/2018  . Syncope 05/29/2018  . Alcohol abuse 05/29/2018  . Fall 05/29/2018  . Dislocation of left shoulder joint 05/29/2018  . Closed fracture of left proximal humerus 05/29/2018  . Abnormal LFTs 05/29/2018  . Hypokalemia 05/29/2018  . Constipation 10/17/2017  . Hoarseness 10/17/2017  . Weakness 04/11/2017  . Weakness of both legs 04/10/2017  . Vitamin B deficiency 01/22/2017  . 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 Stroke, small vessel ([place]) 01/17/2017  . 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 Vitamin B12 deficiency neuropathy ([place]) 01/17/2017  . Adjustment disorder with depressed mood 08/15/2016  . History of colonic polyps 06/14/2016  . Elevated blood pressure reading 06/14/2016  . Hemorrhoids 09/20/2015  . Depressive disorder 08/03/2015  . Back pain with radiation 06/15/2015  . Insomnia 06/03/2015  . Hyperlipidemia LDL goal <130 04/25/2015  . 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 Fatigue 03/29/2015  . Allergic rhinitis  02/10/2015  . Esophageal stricture 10/22/2014  . Overweight (BMI 25.0-29.9) 10/06/2013  . GERD (gastroesophageal reflux disease) 04/23/2012  . Hemorrhoids, internal 11/02/2011  . Essential hypertension 07/25/2007  . Neck pain on left side 07/25/2007    Past Surgical History:  Procedure Laterality Date  . CATARACT EXTRACTION W/PHACO Right 08/16/2018   Procedure: CATARACT EXTRACTION PHACO AND INTRAOCULAR LENS PLACEMENT RIGHT EYE;  Surgeon: Baruch Goldmann, MD;   Location: [facility];  Service: Ophthalmology;  Laterality: Right;  CDE: 4.18  . COLONOSCOPY  10/09/2006   3 mm pedunculated sigmoid colon polyp removed/8-mm sessile hepatic flexure polyp (tubular villous adenoma) removed/6-mm  descending colon polyp removed/small internal hemorrhoids  . COLONOSCOPY  02/28/2011   IOE:VOJJKK, multiple in the rectum/Internal hemorrhoids, MODERATE-CAUSING RECTAL BLEEDING  . COLONOSCOPY N/A 07/05/2016   Procedure: COLONOSCOPY;  Surgeon: [name], MD;  Location: [facility];  Service: Endoscopy;  Laterality: N/A;  11:15am  . 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 Mild erosive gastrtitis most likely due to indomethacin   . FINGER SURGERY Right    4th and 5th digit  . FLEXIBLE SIGMOIDOSCOPY  01/02/2012   SLF: 1. the colonic mucosa appeared normal in the sigmoid colon 2. Large internal hemorrhoids: cause for rectal bleeding/pain . s/p banding X 3   . HAND SURGERY    . REVERSE SHOULDER ARTHROPLASTY Left 05/29/2018   Procedure: REVERSE SHOULDER ARTHROPLASTY;  Surgeon: Hiram Gash, MD;  Location: [facility];  Service: Orthopedics;  Laterality: Left;  . SAVORY DILATION N/A 07/21/2014   Procedure: SAVORY DILATION;  Surgeon: [name], MD;  Location: [facility];  Service: Endoscopy;  Laterality: N/A;    Prior to Admission medications   Medication Sig Start Date End Date Taking? Authorizing Provider  aspirin EC 81 MG tablet Take 1 tablet (81 mg total) by mouth daily. 09/04/18   Clapacs, Madie Reno, MD  atorvastatin (LIPITOR) 20 MG tablet Take 20 mg by mouth daily.     [provider]  gabapentin (NEURONTIN) 800 MG tablet Take 800 mg by mouth 3 (three) times daily.  08/06/19 08/05/20  [provider]  metoprolol tartrate (LOPRESSOR) 25 MG tablet Take 0.5 tablets (12.5 mg total) by mouth 2 (two) times daily. 06/09/19   Connye Burkitt, NP  tetrahydrozoline-zinc (VISINE-AC) 0.05-0.25 % ophthalmic  solution Place 2 drops into both eyes 3 (three) times daily as needed.    [provider]    Allergies Hydrocodone, Benadryl [diphenhydramine hcl (sleep)], and Hydrocortisone  Family History  Problem Relation Age of Onset  . Hypertension Mother   . Hypertension Father   . Stroke Father   . Hypertension Sister   . Hypertension Brother   . Cancer Brother   . Cancer Brother        throat cancer  . Hypertension Brother   . Diabetes Maternal Grandmother   . Colon cancer Neg Hx     Social History Social History   Tobacco Use  . Smoking status: Former Smoker    Quit date: 08/16/2004    Years since quitting: 15.9  . Smokeless tobacco: Current User  . Tobacco comment: no tobacco, has quit long time ago  Vaping Use  . Vaping Use: Never used  Substance Use Topics  . Alcohol use: Yes    Alcohol/week: 84.0 standard drinks    Types: 84 Cans of beer per week  . Drug use: No    Review of Systems  Constitutional: No  fever/chills Eyes: No visual changes. ENT: No sore throat. Cardiovascular: Denies chest pain. Respiratory: Denies shortness of breath. Gastrointestinal: No abdominal pain.  No nausea, no vomiting.  No diarrhea.  No constipation. Genitourinary: Negative for dysuria. Musculoskeletal: Negative for back pain. Skin: Negative for rash. Neurological: Negative for headaches, focal weakness or numbness.  ____________________________________________   PHYSICAL EXAM:  VITAL SIGNS: Vitals:   07/18/20 1421  BP: (!) 154/100  Pulse: (!) 104  Resp: 16  Temp: 98.2 F (36.8 C)  SpO2: 98%      Constitutional: Alert and oriented. Well appearing and in no acute distress. Eyes: Conjunctivae are normal. PERRL. EOMI. Head: Atraumatic. Nose: No congestion/rhinnorhea. Mouth/Throat: Mucous membranes are dry.  Oropharynx non-erythematous. Neck: No stridor. No cervical spine tenderness to palpation. Cardiovascular: Normal rate, regular rhythm. Grossly normal heart  sounds.  Good peripheral circulation. Respiratory: Normal respiratory effort.  No retractions. Lungs CTAB. Gastrointestinal: Soft , nondistended, nontender to palpation. No CVA tenderness. Musculoskeletal: No lower extremity tenderness nor edema.  No joint effusions. No signs of acute trauma. Neurologic:  Normal speech and language. No gross focal neurologic deficits are appreciated. No gait instability noted. Skin:  Skin is warm, dry and intact. No rash noted. Psychiatric: Mood and affect are normal. Speech and behavior are normal.  ____________________________________________   LABS (all labs ordered are listed, but only abnormal results are displayed)  Labs Reviewed  RESP PANEL BY RT-PCR (FLU A&B, COVID) ARPGX2  COMPREHENSIVE METABOLIC PANEL  ETHANOL  URINE DRUG SCREEN, QUALITATIVE ([facility] ONLY)  CBC WITH DIFFERENTIAL/PLATELET  ACETAMINOPHEN LEVEL  SALICYLATE LEVEL  MAGNESIUM    ____________________________________________   MDM / ED COURSE   60 year old male with history of alcohol abuse presents to the ED because of his inability to find a ride to previously-arranged detox facility.  Tachycardic in triage, resolved by the time that I see the patient.  He looks well without evidence of withdrawals, distress, neurologic or vascular deficits.  No signs of trauma or any acute medical pathology.  We called over to the facility and they report no longer having a bed for him.  We will therefore discussed the case with TTS to help arrange detox disposition for the patient.  We will send screening labs as we may need to cast a wider net for placement options.  Will be signed out to oncoming provider to follow-up in the screening labs.    Clinical Course as of 07/18/20 1458  Sun Jul 18, 2020  1454 Our nursing staff has called [facility] detox facility and indicates that they currently do not have any male beds open.  They requested call back in the morning for possible placement then.  [DS]  5027 Discussed with nurse my recommendation for a blood draw due to uncertainty of disposition and possible need to refer him out. [DS]    Clinical Course User Index [DS] Vladimir Crofts, MD    ____________________________________________   FINAL CLINICAL IMPRESSION(S) / ED DIAGNOSES  Final diagnoses:  Alcohol abuse     ED Discharge Orders    None       Jadene Stemmer Tamala Julian   Note:  This document was prepared using Dragon voice recognition software and may include unintentional dictation errors.   Vladimir Crofts, MD 07/18/20 [phone]

## 2020-07-19 NOTE — ED Notes (Signed)
Pt given lunch tray.

## 2020-07-19 NOTE — BH Assessment (Addendum)
Comprehensive Clinical Assessment (CCA) Note  07/19/2020 BERNHARDT ALZAMORA [id number] Recommendations for Services/Supports/Treatments: Pt is recommended for detox and substance abuse treatment. Notified Dr. Ellender Hose and Arrie Aran, RN of disposition recommendation. Pt has been provided with ACT Team resources.   Pt was alert and oriented x4. Pt's speech was coherent, and his thought processes were relevant and goal directed. Pt was tremulous upon interview. Eye contact was fair. Pt's mood is dysphoric; affect is congruent with mood. Patient was noted to have good insight, evidenced by him acknowledging his need to stop drinking. Pt explained that he drinks about a 12 pack daily, reporting that his last use was on 07/18/20. Pt reported no sleep issues; however, his appetite is decreased. Pt reported that he struggles with symptoms of anxiety and depression. Pt identified his main stressors as having chronic physical pain, and not having access to pain medications. Pt expressed that he wakes up in pain, daily and drinks to manage his pain. Pt reported that he is estranged from his family. The patient denied SI, HI or AV/H. Pt had a BAL of 221. Pt explained that he had [facility] however he did not have transportation. Pt expressed a preference for [facility] as they would accept him with a walker.  [place] [facility] from 07/18/2020 in [place] [facility] from 06/24/2020 in [place] [facility] from 09/28/2019 in [place] No Risk No Risk No Risk    The patient demonstrates the following risk factors for suicide: Chronic risk factors for suicide include: substance use disorder. Acute risk factors for suicide include: social withdrawal/isolation. Protective factors for this patient include: hope for the future. Considering these factors, the overall suicide risk at this  point appears to be no risk. Patient is appropriate for outpatient follow up.  Chief Complaint:  Chief Complaint  Patient presents with  . detox   Visit Diagnosis: Alcohol use disorder, severe, dependence ([place])    CCA Screening, Triage and Referral (STR)  Patient Reported Information How did you hear about [place]? Self  Referral name: Self  Referral phone number: No data recorded  Whom do you see for routine medical problems? I don't have a doctor  Practice/Facility Name: No data recorded Practice/Facility Phone Number: No data recorded Name of Contact: No data recorded Contact Number: No data recorded Contact Fax Number: No data recorded Prescriber Name: No data recorded Prescriber Address (if known): No data recorded  What Is the Reason for Your Visit/Call Today? Alcohol detox treatment  How Long Has This Been Causing You Problems? > than 6 months  What Do You Feel Would Help You the Most Today? Alcohol or Drug Use Treatment   Have You Recently Been in Any Inpatient Treatment (Hospital/Detox/Crisis Center/28-Day Program)? No  Name/Location of Program/Hospital:[facility] in 06/05/19  How Long Were You There? See Epic chart  When Were You Discharged? 06/09/2019   Have You Ever Received Services From [employer] Before? Yes  Who Do You [facility] at [facility]? [place]   Have You Recently Had Any Thoughts About Hurting Yourself? No  Are You Planning to Commit Suicide/Harm Yourself At This time? No   Have you Recently Had Thoughts About [place]? No  Explanation: No data recorded  Have You Used Any Alcohol or Drugs in the Past 24 Hours? Yes  How Long Ago Did You Use Drugs or Alcohol? 1700  What Did You Use  and How Much? Alcohol   Do You Currently Have a Therapist/Psychiatrist? No  Name of Therapist/Psychiatrist: No data recorded  Have You Been Recently Discharged From Any Office Practice or Programs? No  Explanation of Discharge From Practice/Program: No  data recorded    CCA Screening Triage Referral Assessment Type of Contact: Face-to-Face  Is this Initial or Reassessment? No data recorded Date Telepsych consult ordered in [facility]:  10/03/2019  Time Telepsych consult ordered in [facility]:  2254   Patient Reported Information Reviewed? Yes  Patient Left Without Being Seen? No data recorded Reason for Not Completing Assessment: No data recorded  Collateral Involvement: n/a   Does Patient Have a Court Appointed Legal Guardian? No data recorded Name and Contact of Legal Guardian: Self  If Minor and Not Living with Parent(s), Who has Custody? n/a  Is CPS involved or ever been involved? Never  Is APS involved or ever been involved? Never   Patient Determined To Be At Risk for Harm To Self or Others Based on Review of Patient Reported Information or Presenting Complaint? No  Method: No data recorded Availability of Means: No data recorded Intent: No data recorded Notification Required: No data recorded Additional Information for Danger to Others Potential: No data recorded Additional Comments for Danger to Others Potential: No data recorded Are There Guns or Other Weapons in Your Home? No data recorded Types of Guns/Weapons: No data recorded Are These Weapons Safely Secured?                            No data recorded Who Could Verify You Are Able To Have These Secured: No data recorded Do You Have any Outstanding Charges, Pending Court Dates, Parole/Probation? No data recorded Contacted To Inform of Risk of Harm To Self or Others: No data recorded  Location of Assessment: [facility] ED   Does Patient Present under Involuntary Commitment? No  IVC Papers Initial File Date: No data recorded  [place] of Residence: [place]   Patient Currently Receiving the Following Services: Not Receiving Services   Determination of Need: Routine (7 days)   Options For Referral: -- (Detox; substance abuse treatment)     CCA  Biopsychosocial Intake/Chief Complaint:  Pt seeking detox and substance abuse treatment  Current Symptoms/Problems: Pt reports having chronic pain and self medicates with alcohol to get relief. Pt explained that he is tired of drinking daily.   Patient Reported Schizophrenia/Schizoaffective Diagnosis in Past: No   Strengths: Hope  Preferences: [employer]: Able to take care of self   Type of Services Patient Feels are Needed: Pt wants detox and substance abuse treatment   Initial Clinical Notes/Concerns: Pt hearing voices saying they are going to harm him.   Mental Health Symptoms Depression:  Worthlessness; Sleep (too much or little)   Duration of Depressive symptoms: Greater than two weeks   Mania:  None   Anxiety:   Worrying; Tension   Psychosis:  Hallucinations (Hearing voices telling him they are going to harm him.)   Duration of Psychotic symptoms: No data recorded  Trauma:  None   Obsessions:  None   Compulsions:  None   Inattention:  None   Hyperactivity/Impulsivity:  N/A   Oppositional/Defiant Behaviors:  N/A   Emotional Irregularity:  None   Other Mood/Personality Symptoms:  No data recorded   Mental Status Exam Appearance and self-care  Stature:  Tall   Weight:  Average weight   Clothing:  Age-appropriate  Grooming:  Normal   Cosmetic use:  None   Posture/gait:  Other (Comment)   Motor activity:  Not Remarkable   Sensorium  Attention:  Normal   Concentration:  Normal   Orientation:  Situation; Place; Person; Object; Time   Recall/memory:  Normal   Affect and Mood  Affect:  Appropriate   Mood:  Dysphoric   Relating  Eye contact:  Normal   Facial expression:  Responsive   Attitude toward examiner:  Irritable   Thought and Language  Speech flow: Clear and Coherent   Thought content:  Appropriate to Mood and Circumstances   Preoccupation:  None   Hallucinations:  None   Organization:  No data  recorded  [employer] of Knowledge:  Average   Intelligence:  Average   Abstraction:  Normal   Judgement:  Normal   Reality Testing:  Realistic   Insight:  Good   Decision Making:  Normal   Social Functioning  Social Maturity:  Responsible   Social Judgement:  "Games developer"   Stress  Stressors:  Housing; Museum/gallery curator (Pain)   Coping Ability:  Deficient supports   Skill Deficits:  None   Supports:  Support needed     Religion: Religion/Spirituality Are You A Religious Person?:  Special educational needs teacher)  Leisure/Recreation: Leisure / Recreation Do You Have Hobbies?:  (UTA)  Exercise/Diet: Exercise/Diet Do You Exercise?:  (UTA) Have You Gained or Lost A Significant Amount of Weight in the Past Six Months?:  (UTA) Do You Follow a Special Diet?: No Do You Have Any Trouble Sleeping?: No   CCA Employment/Education Employment/Work Situation: Employment / Work Situation Employment situation: On disability Why is patient on disability: Pt reports "my wrist, knees, ankles". How long has patient been on disability: Pt reports "Oct 2019" Patient's job has been impacted by current illness: No What is the longest time patient has a held a job?: 5 years Where was the patient employed at that time?: [employer] Has patient ever been in the [employer]?: No  Education: Education Is Patient Currently Attending School?: No   CCA Family/Childhood History Family and Relationship History: Family history Marital status: Single Are you sexually active?: No What is your sexual orientation?: Heterosexual Has your sexual activity been affected by drugs, alcohol, medication, or emotional stress?: Pt denies. Does patient have children?: Yes How many children?: 1  Childhood History:  Childhood History By whom was/is the patient raised?: Mother Patient's description of current relationship with people who raised him/her: Pt stated, "I don't have family" Did patient suffer any  verbal/emotional/physical/sexual abuse as a child?: No Did patient suffer from severe childhood neglect?: No Has patient ever been sexually abused/assaulted/raped as an adolescent or adult?: No Was the patient ever a victim of a crime or a disaster?: No Witnessed domestic violence?: No Has patient been affected by domestic violence as an adult?: No  Child/Adolescent Assessment:     CCA Substance Use Alcohol/Drug Use: Alcohol / Drug Use Pain Medications: See PTA Prescriptions: See PTA Over the Counter: See PTA History of alcohol / drug use?: Yes Longest period of sobriety (when/how long): Unable quantify Negative Consequences of Use: Personal relationships Withdrawal Symptoms:  (Denied having withdrawal symptoms) Substance #1 Name of Substance 1: Alcohol 1 - Amount (size/oz): 12 pack of beer 1 - Frequency: Daily 1 - Last Use / Amount: 07/18/20                       ASAM's:  Six Dimensions  of Multidimensional Assessment  Dimension 1:  Acute Intoxication and/or Withdrawal Potential:   Dimension 1:  Description of individual's past and current experiences of substance use and withdrawal: Pt denies having withdrawal symptoms  Dimension 2:  Biomedical Conditions and Complications:   Dimension 2:  Description of patient's biomedical conditions and  complications: Pt has multiple health concerns  Dimension 3:  Emotional, Behavioral, or Cognitive Conditions and Complications:  Dimension 3:  Description of emotional, behavioral, or cognitive conditions and complications: Major Depressive Disorder  Dimension 4:  Readiness to Change:  Dimension 4:  Description of Readiness to Change criteria: Pt expressed a readiness to change  Dimension 5:  Relapse, Continued use, or Continued Problem Potential:  Dimension 5:  Relapse, continued use, or continued problem potential critiera description: Pt has chronic pain  Dimension 6:  Recovery/Living Environment:  Dimension 6:  Recovery/Iiving  environment criteria description: Pt has psychosocial problem of homelessness  ASAM Severity Score: ASAM's Severity Rating Score: 19  ASAM Recommended Level of Treatment: ASAM Recommended Level of Treatment: Level III Residential Treatment   Substance use Disorder (SUD) Substance Use Disorder (SUD)  Checklist Symptoms of Substance Use: Continued use despite having a persistent/recurrent physical/psychological problem caused/exacerbated by use,Substance(s) often taken in larger amounts or over longer times than was intended  Recommendations for Services/Supports/Treatments: Recommendations for Services/Supports/Treatments Recommendations For Services/Supports/Treatments: Detox,Inpatient Hospitalization  DSM5 Diagnoses: Patient Active Problem List   Diagnosis Date Noted  . Homelessness 10/04/2019  . 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 Acute psychosis ([place]) 06/05/2019  . Delusional disorder ([place]) 05/24/2019  . Swelling of limb 03/11/2019  . Peripheral polyneuropathy 02/12/2019  . Alcohol intoxication ([place]) 08/27/2018  . Hallucinations 08/27/2018  . MDD (major depressive disorder), recurrent, severe, with psychosis ([place]) 08/27/2018  . Alcohol use disorder, severe, dependence ([place]) 08/27/2018  . TIA (transient ischemic attack) 05/29/2018  . Syncope 05/29/2018  . Alcohol abuse 05/29/2018  . Fall 05/29/2018  . Dislocation of left shoulder joint 05/29/2018  . Closed fracture of left proximal humerus 05/29/2018  . Abnormal LFTs 05/29/2018  . Hypokalemia 05/29/2018  . Constipation 10/17/2017  . Hoarseness 10/17/2017  . Weakness 04/11/2017  . Weakness of both legs 04/10/2017  . Vitamin B deficiency 01/22/2017  . 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 Stroke, small vessel ([place]) 01/17/2017  . 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 Vitamin B12 deficiency neuropathy ([place]) 01/17/2017  . Adjustment disorder with depressed mood 08/15/2016  . History of colonic  polyps 06/14/2016  . Elevated blood pressure reading 06/14/2016  . Hemorrhoids 09/20/2015  . Depressive disorder 08/03/2015  . Back pain with radiation 06/15/2015  . Insomnia 06/03/2015  . Hyperlipidemia LDL goal <130 04/25/2015  . 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 Fatigue 03/29/2015  . Allergic rhinitis 02/10/2015  . Esophageal stricture 10/22/2014  . Overweight (BMI 25.0-29.9) 10/06/2013  . GERD (gastroesophageal reflux disease) 04/23/2012  . Hemorrhoids, internal 11/02/2011  . Essential hypertension 07/25/2007  . Neck pain on left side 07/25/2007    Danelle Curiale R Zayvien Canning, LCAS

## 2020-07-19 NOTE — ED Notes (Signed)
ED charge made aware that pt does not have a ride. Charge okay'd d/c patient to lobby.

## 2020-07-19 NOTE — Discharge Instructions (Signed)
Drink alcohol only in moderation.  Return to the ER for worsening symptoms, persistent vomiting, feelings of hurting yourself or others, or other concerns.

## 2020-07-19 NOTE — ED Provider Notes (Signed)
-----------------------------------------   11:19 PM on 07/19/2020 -----------------------------------------  Patient has changed his mind and desires discharge.  He knows that he will lose his place in the queue for [facility].  Strict return precautions given.  Patient verbalizes understanding agrees with plan of care.   Paulette Blanch, MD 07/20/20 [phone]

## 2020-07-19 NOTE — ED Notes (Signed)
Pt refuses to take Gabapentin, despite being told this would help the craps in his hands.

## 2020-07-19 NOTE — BH Assessment (Addendum)
Referral for substance abuse treatment faxed to;   [name] [phone]),   . [facility] [phone])  . [facility] [phone])  . [facility] [phone])  . [facility] [phone]),    [facility] [phone])  . [facility] [phone])

## 2020-07-19 NOTE — ED Notes (Signed)
Pt up at nurses station requesting d/c paperwork. Pt educated that he needs a safe ride in order for [place] to d/c him. Pt states that he does not have a ride or anyone he can call, but states he will figure it out. Pt educated that EDP has to discharge pt, pt understands.   EDP made aware.

## 2020-07-25 ENCOUNTER — Emergency Department
Admission: EM | Admit: 2020-07-25 | Discharge: 2020-07-25 | Disposition: A | Discharge status: Home / Self Care | Payer: 59 | Attending: Emergency Medicine | Admitting: Emergency Medicine

## 2020-07-25 ENCOUNTER — Other Ambulatory Visit: Payer: Self-pay

## 2020-07-25 DIAGNOSIS — F10920 Alcohol use, unspecified with intoxication, uncomplicated: Secondary | ICD-10-CM

## 2020-07-25 DIAGNOSIS — Z720 Tobacco use: Secondary | ICD-10-CM

## 2020-07-25 DIAGNOSIS — Y906 Blood alcohol level of 120-199 mg/100 ml: Secondary | ICD-10-CM | POA: Insufficient documentation

## 2020-07-25 DIAGNOSIS — F10929 Alcohol use, unspecified with intoxication, unspecified: Secondary | ICD-10-CM | POA: Diagnosis present

## 2020-07-25 DIAGNOSIS — E876 Hypokalemia: Secondary | ICD-10-CM

## 2020-07-25 LAB — SALICYLATE LEVEL: Salicylate Lvl: <7 mg/dL — ABNORMAL LOW (ref 7.0–30.0)

## 2020-07-25 LAB — COMPREHENSIVE METABOLIC PANEL
ALT: 25 U/L (ref 0–44)
AST: 48 U/L — ABNORMAL HIGH (ref 15–41)
Albumin: 3.5 g/dL (ref 3.5–5.0)
Alkaline Phosphatase: 49 U/L (ref 38–126)
Anion gap: 14 (ref 5–15)
BUN: 9 mg/dL (ref 6–20)
CO2: 20 mmol/L — ABNORMAL LOW (ref 22–32)
Calcium: 8.5 mg/dL — ABNORMAL LOW (ref 8.9–10.3)
Chloride: 99 mmol/L (ref 98–111)
Creatinine, Ser: 0.89 mg/dL (ref 0.61–1.24)
GFR, Estimated: >60 mL/min (ref >60)
Glucose, Bld: 98 mg/dL (ref 70–99)
Potassium: 3.1 mmol/L — ABNORMAL LOW (ref 3.5–5.1)
Sodium: 133 mmol/L — ABNORMAL LOW (ref 135–145)
Total Bilirubin: 1.3 mg/dL — ABNORMAL HIGH (ref 0.3–1.2)
Total Protein: 7.5 g/dL (ref 6.5–8.1)

## 2020-07-25 LAB — CBC WITH DIFFERENTIAL/PLATELET
Abs Immature Granulocytes: 0.06 10*3/uL (ref 0.00–0.07)
Basophils Absolute: 0 10*3/uL (ref 0.0–0.1)
Basophils Relative: 0 %
Eosinophils Absolute: 0.1 10*3/uL (ref 0.0–0.5)
Eosinophils Relative: 2 %
HCT: 34.5 % — ABNORMAL LOW (ref 39.0–52.0)
Hemoglobin: 12.2 g/dL — ABNORMAL LOW (ref 13.0–17.0)
Immature Granulocytes: 1 %
Lymphocytes Relative: 20 %
Lymphs Abs: 1.4 10*3/uL (ref 0.7–4.0)
MCH: 32 pg (ref 26.0–34.0)
MCHC: 35.4 g/dL (ref 30.0–36.0)
MCV: 90.6 fL (ref 80.0–100.0)
Monocytes Absolute: 1.5 10*3/uL — ABNORMAL HIGH (ref 0.1–1.0)
Monocytes Relative: 21 %
Neutro Abs: 3.8 10*3/uL (ref 1.7–7.7)
Neutrophils Relative %: 56 %
Platelets: 178 10*3/uL (ref 150–400)
RBC: 3.81 MIL/uL — ABNORMAL LOW (ref 4.22–5.81)
RDW: 13.8 % (ref 11.5–15.5)
WBC: 6.9 10*3/uL (ref 4.0–10.5)
nRBC: 0 % (ref 0.0–0.2)

## 2020-07-25 LAB — MAGNESIUM: Magnesium: 1.7 mg/dL (ref 1.7–2.4)

## 2020-07-25 LAB — ETHANOL: Alcohol, Ethyl (B): 158 mg/dL — ABNORMAL HIGH (ref <10)

## 2020-07-25 LAB — ACETAMINOPHEN LEVEL: Acetaminophen (Tylenol), Serum: <10 ug/mL — ABNORMAL LOW (ref 10–30)

## 2020-07-25 MED ORDER — POTASSIUM CHLORIDE CRYS ER 20 MEQ PO TBCR
80.0000 meq | EXTENDED_RELEASE_TABLET | Freq: Once | ORAL | Status: AC
  Administered 2020-07-25: 80 meq via ORAL
  Filled 2020-07-25: qty 4

## 2020-07-25 MED ORDER — LORAZEPAM 2 MG/ML IJ SOLN: 0.0000 mg | Freq: Four times a day (QID) | INTRAMUSCULAR | Status: DC

## 2020-07-25 MED ORDER — SODIUM CHLORIDE 0.9 % IV BOLUS
1000.0000 mL | Freq: Once | INTRAVENOUS | Status: AC
  Administered 2020-07-25: 1000 mL via INTRAVENOUS

## 2020-07-25 MED ORDER — THIAMINE HCL 100 MG PO TABS
100.0000 mg | ORAL_TABLET | Freq: Every day | ORAL | Status: DC
  Administered 2020-07-25: 100 mg via ORAL
  Filled 2020-07-25 (×2): qty 1

## 2020-07-25 NOTE — ED Provider Notes (Signed)
[facility] Emergency Department Provider Note   ____________________________________________   Event Date/Time   First MD Initiated Contact with Patient 07/25/20 [phone]     (approximate)  I have reviewed the triage vital signs and the nursing notes.   HISTORY  Chief Complaint Alcohol Intoxication  Level of V caveat: Limited by intoxication  HPI Tristan Cook is a 60 y.o. male brought to the ED via EMS from gas station for alcohol intoxication.  Patient was found sleeping in front of a gas station.  Per EMS, [employer] police were attempting to charge him with trespassing but patient told police he had back pain.  Tells triage nurse he does not have back pain.  Denies all pain.  Simply states "I drink too much".  Denies trauma.     Past medical history None  There are no problems to display for this patient.   Prior to Admission medications   Not on File    Allergies Patient has no known allergies.  No family history on file.  Social History    Review of Systems  Constitutional: Positive for intoxication.  No fever/chills Eyes: No visual changes. ENT: No sore throat. Cardiovascular: Denies chest pain. Respiratory: Denies shortness of breath. Gastrointestinal: No abdominal pain.  No nausea, no vomiting.  No diarrhea.  No constipation. Genitourinary: Negative for dysuria. Musculoskeletal: Negative for back pain. Skin: Negative for rash. Neurological: Negative for headaches, focal weakness or numbness.   ____________________________________________   PHYSICAL EXAM:  VITAL SIGNS: ED Triage Vitals  Enc Vitals Group     BP 07/25/20 0629 121/82     Pulse Rate 07/25/20 0629 74     Resp 07/25/20 0624 12     Temp 07/25/20 0626 (!) 96 F (35.6 C)     Temp Source 07/25/20 0624 Axillary     SpO2 07/25/20 0629 97 %     Weight 07/25/20 0625 200 lb (90.7 kg)     Height 07/25/20 0625 6' (1.829 m)     Head Circumference --      Peak Flow  --      Pain Score 07/25/20 0624 0     Pain Loc --      Pain Edu? --      Excl. in [place]? --     Constitutional: Alert and oriented.  Intoxicated appearing and in no acute distress. Eyes: Conjunctivae are normal. PERRL. EOMI. Head: Atraumatic. Nose: Atraumatic. Mouth/Throat: Mucous membranes are moist.  No dental malocclusion. Neck: No stridor.  No cervical spine tenderness to palpation. Cardiovascular: Normal rate, regular rhythm. Grossly normal heart sounds.  Good peripheral circulation. Respiratory: Normal respiratory effort.  No retractions. Lungs CTAB. Gastrointestinal: Soft and nontender to light or deep palpation. No distention. No abdominal bruits. No CVA tenderness. Musculoskeletal: No spinal tenderness to palpation.  Pelvis is stable.  No lower extremity tenderness nor edema.  No joint effusions. Neurologic:  Normal speech and language. No gross focal neurologic deficits are appreciated. MAEx4. Skin:  Skin is warm, dry and intact. No rash noted. Psychiatric: Mood and affect are intoxicated. Speech and behavior are normal.  ____________________________________________   LABS (all labs ordered are listed, but only abnormal results are displayed)  Labs Reviewed  CBC WITH DIFFERENTIAL/PLATELET  COMPREHENSIVE METABOLIC PANEL  ETHANOL  ACETAMINOPHEN LEVEL  SALICYLATE LEVEL   ____________________________________________  EKG  None ____________________________________________  RADIOLOGY I, Raetta Agostinelli J, personally viewed and evaluated these images (plain radiographs) as part of my medical decision making, as well as reviewing  the written report by the radiologist.  ED MD interpretation: None  Official radiology report(s): No results found.  ____________________________________________   PROCEDURES  Procedure(s) performed (including Critical Care):  .1-3 Lead EKG Interpretation Performed by: Paulette Blanch, MD Authorized by: Paulette Blanch, MD     Interpretation:  normal     ECG rate:  75   ECG rate assessment: normal     Rhythm: sinus rhythm     Ectopy: none     Conduction: normal   Comments:     Patient placed on cardiac monitor to evaluate for arrhythmias     ____________________________________________   INITIAL IMPRESSION / ASSESSMENT AND PLAN / ED COURSE  As part of my medical decision making, I reviewed the following data within the [place] notes reviewed and incorporated, Labs reviewed, Old chart reviewed and Notes from prior ED visits     60 year old male presenting with acute alcohol intoxication.  Differential diagnosis includes but is not limited to alcohol intoxication, substance use, infectious, metabolic etiology, etc.  Will obtain lab work, initiate IV fluid resuscitation.  Placed on CIWA protocol.  Clinical Course as of 07/25/20 0645  Sun Jul 25, 2020  0633 Care transferred to Dr. Tamala Julian at change of shift pending laboratory results, IV fluid hydration and reassessment.  Anticipate patient may be able to be discharged once he is sober and ambulatory with steady gait. [JS]    Clinical Course User Index [JS] Paulette Blanch, MD     ____________________________________________   FINAL CLINICAL IMPRESSION(S) / ED DIAGNOSES  Final diagnoses:  Alcoholic intoxication without complication [facility])     ED Discharge Orders    None      *Please note:  Tristan Cook was evaluated in Emergency Department on 07/25/2020 for the symptoms described in the history of present illness. He was evaluated in the context of the global COVID-19 pandemic, which necessitated consideration that the patient might be at risk for infection with the SARS-CoV-2 virus that causes COVID-19. Institutional protocols and algorithms that pertain to the evaluation of patients at risk for COVID-19 are in a state of rapid change based on information released by regulatory bodies including the CDC and federal and state organizations.  These policies and algorithms were followed during the patient's care in the ED.  Some ED evaluations and interventions may be delayed as a result of limited staffing during and the pandemic.*   Note:  This document was prepared using Dragon voice recognition software and may include unintentional dictation errors.   Paulette Blanch, MD 07/25/20 [phone]

## 2020-07-25 NOTE — ED Notes (Signed)
Recollect red top sent to lab.

## 2020-07-25 NOTE — ED Provider Notes (Signed)
I sent care of this patient approximately 0 700.  Please see all providers note for full details regarding patient's initial evaluation assessment.  In brief patient presents with concern for possible alcohol intoxication.  He appears intoxicated but otherwise has a nonfocal neuro exam with no complaints on arrival.  He was brought in by PD after he fell asleep at a gas station.  Plan is to follow-up labs and reassess.  CBC unremarkable.  CMP with K of 3.1 no other significant derangements.  ST is slightly elevated at 48.  Ethanol is elevated at 158.  Magnesium is WNL.  On reassessment patient states he feels little tired.  He states he has chronic weakness in his left arm drinks about 12 pack/day.  He also endorses tobacco abuse but denies any illicit drug use otherwise or any SI HI.  He does endorse being homeless at this time.  Denies any other acute physical complaints.  He is requesting alcohol abuse resources and said that if available would be amenable to inpatient detox.  TTS consulted.  The patient has been placed in psychiatric observation due to the need to provide a safe environment for the patient while obtaining psychiatric consultation and evaluation, as well as ongoing medical and medication management to treat the patient's condition.  The patient has not been placed under full IVC at this time.    Lucrezia Starch, MD 07/25/20 [phone]

## 2020-07-25 NOTE — ED Notes (Signed)
Patient attempting to contact ride at this time.

## 2020-07-25 NOTE — ED Provider Notes (Signed)
Patient seen by TTS and provided outpatient alcohol abuse resources.  He was able to ambulate with steady gait unassisted and has no slurred speech on my reassessment.  He is now clinically sober.  He is requesting discharge.  Discharged stable condition.   Lucrezia Starch, MD 07/25/20 [phone]

## 2020-07-25 NOTE — ED Notes (Signed)
E-signature not working at this time. Pt verbalized understanding of D/C instructions, prescriptions and follow up care with no further questions at this time. Pt in NAD and ambulatory at time of D/C. Pt reports friend is picking him up from ER.

## 2020-07-25 NOTE — ED Triage Notes (Signed)
Pt was found sleeping in front of gas station. Per ems police were attempting to "trespass him" but pt states he had back pain. Pt with ETOH, states "I drank too much". Pt declines pain or needs to this RN.

## 2020-07-25 NOTE — ED Notes (Signed)
Bed alarm on and audible, call light within reach, bed in lowest and locked position.

## 2020-07-25 NOTE — BH Assessment (Signed)
Comprehensive Clinical Assessment (CCA) Screening, Triage and Referral Note  07/25/2020 Zacharie Portner [id number]  Dathan Attia is a 60 year old male who was brought to the ER via law enforcement, after he was found sleep at a local gas station, intoxicated. Offered to connect the patient with detox facility but he declined. Patient denies SI/HI and AV/H. Patient provided outpatient resources. He states it wouldn't be any good because he has made plans to move to [place] this week, to live with his cousin.  Chief Complaint:  Chief Complaint  Patient presents with  . Alcohol Intoxication   Visit Diagnosis: Alcohol Use disorder  Patient Reported Information How did you hear about [place]? Legal System   Referral name: Law Enforcement   Referral phone number: No data recorded Whom do you see for routine medical problems? No data recorded  Practice/Facility Name: No data recorded  Practice/Facility Phone Number: No data recorded  Name of Contact: No data recorded  Contact Number: No data recorded  Contact Fax Number: No data recorded  Prescriber Name: No data recorded  Prescriber Address (if known): No data recorded What Is the Reason for Your Visit/Call Today? Brought to the ER via law enforcement, after he was found sleep at a local gas station, intoxicated.  How Long Has This Been Causing You Problems? <Week  Have You Recently Been in Any Inpatient Treatment (Hospital/Detox/Crisis Center/28-Day Program)? No   Name/Location of Program/Hospital:No data recorded  How Long Were You There? No data recorded  When Were You Discharged? No data recorded Have You Ever Received Services From [facility] Before? No   Who Do You See at [facility]? No data recorded Have You Recently Had Any Thoughts About Hurting Yourself? No   Are You Planning to Commit Suicide/Harm Yourself At This time?  No  Have you Recently Had Thoughts About [place]? No   Explanation: No data recorded Have  You Used Any Alcohol or Drugs in the Past 24 Hours? Yes   How Long Ago Did You Use Drugs or Alcohol?  No data recorded  What Did You Use and How Much? "6 pack"  What Do You Feel Would Help You the Most Today? Alcohol or Drug Use Treatment  Do You Currently Have a Therapist/Psychiatrist? No   Name of Therapist/Psychiatrist: No data recorded  Have You Been Recently Discharged From Any Office Practice or Programs? No   Explanation of Discharge From Practice/Program:  No data recorded    CCA Screening Triage Referral Assessment Type of Contact: Face-to-Face   Is this Initial or Reassessment? No data recorded  Date Telepsych consult ordered in [facility]:  No data recorded  Time Telepsych consult ordered in [facility]:  No data recorded Patient Reported Information Reviewed? Yes   Patient Left Without Being Seen? No data recorded  Reason for Not Completing Assessment: No data recorded Collateral Involvement: No data recorded Does Patient Have a [place]? No data recorded  Name and Contact of Legal Guardian:  No data recorded If Minor and Not Living with Parent(s), Who has Custody? No data recorded Is CPS involved or ever been involved? Never  Is APS involved or ever been involved? Never  Patient Determined To Be At Risk for Harm To Self or Others Based on Review of Patient Reported Information or Presenting Complaint? No   Method: No data recorded  Availability of Means: No data recorded  Intent: No data recorded  Notification Required: No data recorded  Additional Information for Danger to Others  Potential:  No data recorded  Additional Comments for Danger to Others Potential:  No data recorded  Are There Guns or Other Weapons in [place]?  No data recorded   Types of Guns/Weapons: No data recorded   Are These Weapons Safely Secured?                              No data recorded   Who Could Verify You Are Able To Have These Secured:    No data recorded Do You Have  any Outstanding Charges, Pending Court Dates, Parole/Probation? No data recorded Contacted To Inform of Risk of Harm To Self or Others: No data recorded Location of Assessment: [facility] ED  Does Patient Present under Involuntary Commitment? No   IVC Papers Initial File Date: No data recorded  [place] of Residence: [place]  Patient Currently Receiving the Following Services: Not Receiving Services   Determination of Need: Emergent (2 hours)   Options For Referral: Outpatient Therapy   Gunnar Fusi MS, LCAS, [facility], [facility] Therapeutic Triage Specialist 07/25/2020 12:03 PM

## 2020-07-26 ENCOUNTER — Other Ambulatory Visit: Payer: Self-pay

## 2020-07-26 ENCOUNTER — Emergency Department
Admission: EM | Admit: 2020-07-26 | Discharge: 2020-07-27 | Disposition: A | Discharge status: Home / Self Care | Payer: 59 | Attending: Emergency Medicine | Admitting: Emergency Medicine

## 2020-07-26 DIAGNOSIS — F10951 Alcohol use, unspecified with alcohol-induced psychotic disorder with hallucinations: Secondary | ICD-10-CM | POA: Diagnosis present

## 2020-07-26 DIAGNOSIS — Z20822 Contact with and (suspected) exposure to covid-19: Secondary | ICD-10-CM | POA: Diagnosis not present

## 2020-07-26 DIAGNOSIS — Z7982 Long term (current) use of aspirin: Secondary | ICD-10-CM | POA: Diagnosis not present

## 2020-07-26 DIAGNOSIS — Z87891 Personal history of nicotine dependence: Secondary | ICD-10-CM | POA: Diagnosis not present

## 2020-07-26 DIAGNOSIS — Z96612 Presence of left artificial shoulder joint: Secondary | ICD-10-CM | POA: Diagnosis not present

## 2020-07-26 DIAGNOSIS — Z79899 Other long term (current) drug therapy: Secondary | ICD-10-CM | POA: Diagnosis not present

## 2020-07-26 DIAGNOSIS — Y909 Presence of alcohol in blood, level not specified: Secondary | ICD-10-CM | POA: Insufficient documentation

## 2020-07-26 DIAGNOSIS — Z046 Encounter for general psychiatric examination, requested by authority: Secondary | ICD-10-CM | POA: Diagnosis present

## 2020-07-26 DIAGNOSIS — I10 Essential (primary) hypertension: Secondary | ICD-10-CM | POA: Insufficient documentation

## 2020-07-26 DIAGNOSIS — F10151 Alcohol abuse with alcohol-induced psychotic disorder with hallucinations: Secondary | ICD-10-CM | POA: Insufficient documentation

## 2020-07-26 DIAGNOSIS — F1010 Alcohol abuse, uncomplicated: Secondary | ICD-10-CM

## 2020-07-26 DIAGNOSIS — F1020 Alcohol dependence, uncomplicated: Secondary | ICD-10-CM | POA: Diagnosis not present

## 2020-07-26 DIAGNOSIS — Z008 Encounter for other general examination: Secondary | ICD-10-CM

## 2020-07-26 LAB — COMPREHENSIVE METABOLIC PANEL
ALT: 30 U/L (ref 0–44)
AST: 55 U/L — ABNORMAL HIGH (ref 15–41)
Albumin: 3.7 g/dL (ref 3.5–5.0)
Alkaline Phosphatase: 52 U/L (ref 38–126)
Anion gap: 11 (ref 5–15)
BUN: 10 mg/dL (ref 6–20)
CO2: 19 mmol/L — ABNORMAL LOW (ref 22–32)
Calcium: 9.1 mg/dL (ref 8.9–10.3)
Chloride: 108 mmol/L (ref 98–111)
Creatinine, Ser: 1.38 mg/dL — ABNORMAL HIGH (ref 0.61–1.24)
GFR, Estimated: 60 mL/min — ABNORMAL LOW (ref >60)
Glucose, Bld: 126 mg/dL — ABNORMAL HIGH (ref 70–99)
Potassium: 3.5 mmol/L (ref 3.5–5.1)
Sodium: 138 mmol/L (ref 135–145)
Total Bilirubin: 2.3 mg/dL — ABNORMAL HIGH (ref 0.3–1.2)
Total Protein: 8 g/dL (ref 6.5–8.1)

## 2020-07-26 LAB — RESP PANEL BY RT-PCR (FLU A&B, COVID) ARPGX2
Influenza A by PCR: NEGATIVE
Influenza B by PCR: NEGATIVE
SARS Coronavirus 2 by RT PCR: NEGATIVE

## 2020-07-26 LAB — CBC
HCT: 32.3 % — ABNORMAL LOW (ref 39.0–52.0)
Hemoglobin: 11.4 g/dL — ABNORMAL LOW (ref 13.0–17.0)
MCH: 31.8 pg (ref 26.0–34.0)
MCHC: 35.3 g/dL (ref 30.0–36.0)
MCV: 90.2 fL (ref 80.0–100.0)
Platelets: 216 10*3/uL (ref 150–400)
RBC: 3.58 MIL/uL — ABNORMAL LOW (ref 4.22–5.81)
RDW: 13.9 % (ref 11.5–15.5)
WBC: 10.1 10*3/uL (ref 4.0–10.5)
nRBC: 0 % (ref 0.0–0.2)

## 2020-07-26 LAB — SALICYLATE LEVEL: Salicylate Lvl: <7 mg/dL — ABNORMAL LOW (ref 7.0–30.0)

## 2020-07-26 LAB — ACETAMINOPHEN LEVEL: Acetaminophen (Tylenol), Serum: <10 ug/mL — ABNORMAL LOW (ref 10–30)

## 2020-07-26 LAB — ETHANOL: Alcohol, Ethyl (B): <10 mg/dL (ref <10)

## 2020-07-26 MED ORDER — LORAZEPAM 2 MG/ML IJ SOLN: 0.0000 mg | Freq: Two times a day (BID) | INTRAMUSCULAR | Status: DC

## 2020-07-26 MED ORDER — LORAZEPAM 2 MG PO TABS
0.0000 mg | ORAL_TABLET | Freq: Four times a day (QID) | ORAL | Status: DC
  Administered 2020-07-26: 1 mg via ORAL
  Filled 2020-07-26: qty 1

## 2020-07-26 MED ORDER — LORAZEPAM 2 MG/ML IJ SOLN: 0.0000 mg | Freq: Four times a day (QID) | INTRAMUSCULAR | Status: DC

## 2020-07-26 MED ORDER — LORAZEPAM 2 MG PO TABS: 0.0000 mg | ORAL_TABLET | Freq: Two times a day (BID) | ORAL | Status: DC

## 2020-07-26 MED ORDER — THIAMINE HCL 100 MG PO TABS
100.0000 mg | ORAL_TABLET | Freq: Every day | ORAL | Status: DC
  Administered 2020-07-26 – 2020-07-27 (×2): 100 mg via ORAL
  Filled 2020-07-26 (×2): qty 1

## 2020-07-26 MED ORDER — THIAMINE HCL 100 MG/ML IJ SOLN
100.0000 mg | Freq: Every day | INTRAMUSCULAR | Status: DC
  Filled 2020-07-26: qty 1

## 2020-07-26 NOTE — BH Assessment (Signed)
This counselor attempted to assess pt however pt was unable to be aroused. TTS to follow up.

## 2020-07-26 NOTE — ED Notes (Addendum)
Pt's belongings include:  1 pair of white shoes 1 pair of blue sweatpants  1 black shirt  1 silver necklace 1 pair of white underwear

## 2020-07-26 NOTE — ED Provider Notes (Signed)
[facility] Emergency Department Provider Note  ____________________________________________   Event Date/Time   First MD Initiated Contact with Patient 07/26/20 1611     (approximate)  I have reviewed the triage vital signs and the nursing notes.   HISTORY  Chief Complaint IVC    HPI Tristan Cook is a 60 y.o. male with substance abuse who comes in under IVC from police.  Patient was found wandering around [place] parking lot.  He was sitting on someone's vehicle and is not now.  Patient states that he supposed to go to [place] and thought the person's car he was sitting on was supposed take him to [place].  Denies any SI, HI.  Patient does report using alcohol.  Denies any other drugs.            Past Medical History:  Diagnosis Date  . Abdominal pain   . Adenomatous colon polyp 2008  . Anxiety   . Arthritis   . Back pain   . Bilateral lumbar radiculopathy   . Blurred vision, bilateral   . Chronic back pain   . Constipation   . Depression   . GERD (gastroesophageal reflux disease)   . Gout   . Hemorrhoids   . High cholesterol   . Hypertension   . Neck pain   . Neuropathy   . Neuropathy of both feet   . Numbness    rectal  . Numbness of legs   . Stroke ([place]) 2019  . Substance abuse ([place])    h/o excessive alcohol use; pt says he limits use to one to 2 beers daily he currently    Patient Active Problem List   Diagnosis Date Noted  . Homelessness 10/04/2019  . 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 Acute psychosis ([place]) 06/05/2019  . Delusional disorder ([place]) 05/24/2019  . Swelling of limb 03/11/2019  . Peripheral polyneuropathy 02/12/2019  . Alcohol intoxication ([place]) 08/27/2018  . Hallucinations 08/27/2018  . 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 Alcohol use disorder, severe, dependence ([place]) 08/27/2018  . TIA (transient ischemic attack)  05/29/2018  . Syncope 05/29/2018  . Alcohol abuse 05/29/2018  . Fall 05/29/2018  . Dislocation of left shoulder joint 05/29/2018  . Closed fracture of left proximal humerus 05/29/2018  . Abnormal LFTs 05/29/2018  . Hypokalemia 05/29/2018  . Constipation 10/17/2017  . Hoarseness 10/17/2017  . Weakness 04/11/2017  . Weakness of both legs 04/10/2017  . Vitamin B deficiency 01/22/2017  . 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 Stroke, small vessel ([place]) 01/17/2017  . 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 Vitamin B12 deficiency neuropathy ([place]) 01/17/2017  . Adjustment disorder with depressed mood 08/15/2016  . History of colonic polyps 06/14/2016  . Elevated blood pressure reading 06/14/2016  . Hemorrhoids 09/20/2015  . Depressive disorder 08/03/2015  . Back pain with radiation 06/15/2015  . Insomnia 06/03/2015  . Hyperlipidemia LDL goal <130 04/25/2015  . 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 Fatigue 03/29/2015  . Allergic rhinitis 02/10/2015  . Esophageal stricture 10/22/2014  . Overweight (BMI 25.0-29.9) 10/06/2013  . GERD (gastroesophageal reflux disease) 04/23/2012  . Hemorrhoids, internal 11/02/2011  . Essential hypertension 07/25/2007  . Neck pain on left side 07/25/2007    Past Surgical History:  Procedure Laterality Date  . CATARACT EXTRACTION W/PHACO Right 08/16/2018   Procedure: CATARACT EXTRACTION PHACO AND INTRAOCULAR LENS PLACEMENT RIGHT EYE;  Surgeon: Baruch Goldmann, MD;  Location: [facility] ORS;  Service: Ophthalmology;  Laterality: Right;  CDE: 4.18  . COLONOSCOPY  10/09/2006   3 mm pedunculated sigmoid colon polyp removed/8-mm sessile hepatic flexure polyp (tubular villous adenoma) removed/6-mm  descending colon polyp removed/small internal hemorrhoids  . COLONOSCOPY  02/28/2011   KXF:GHWEXH, multiple in the rectum/Internal hemorrhoids, MODERATE-CAUSING RECTAL BLEEDING  . COLONOSCOPY N/A 07/05/2016   Procedure: COLONOSCOPY;  Surgeon: [name], MD;  Location: [facility]  ENDO SUITE;  Service: Endoscopy;  Laterality: N/A;  11:15am  . 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 Mild erosive gastrtitis most likely due to indomethacin   . FINGER SURGERY Right    4th and 5th digit  . FLEXIBLE SIGMOIDOSCOPY  01/02/2012   SLF: 1. the colonic mucosa appeared normal in the sigmoid colon 2. Large internal hemorrhoids: cause for rectal bleeding/pain . s/p banding X 3   . HAND SURGERY    . REVERSE SHOULDER ARTHROPLASTY Left 05/29/2018   Procedure: REVERSE SHOULDER ARTHROPLASTY;  Surgeon: Hiram Gash, MD;  Location: [facility];  Service: Orthopedics;  Laterality: Left;  . SAVORY DILATION N/A 07/21/2014   Procedure: SAVORY DILATION;  Surgeon: [name], MD;  Location: [facility] ENDO SUITE;  Service: Endoscopy;  Laterality: N/A;    Prior to Admission medications   Medication Sig Start Date End Date Taking? Authorizing Provider  aspirin EC 81 MG tablet Take 1 tablet (81 mg total) by mouth daily. 09/04/18   Clapacs, Madie Reno, MD  atorvastatin (LIPITOR) 20 MG tablet Take 20 mg by mouth daily.     [provider]  gabapentin (NEURONTIN) 800 MG tablet Take 800 mg by mouth 3 (three) times daily.  Patient not taking: Reported on 07/18/2020 08/06/19 08/05/20  [provider]  hydrOXYzine (VISTARIL) 25 MG capsule Take 25 mg by mouth at bedtime as needed (insomnia).    [provider]  metoprolol tartrate (LOPRESSOR) 25 MG tablet Take 0.5 tablets (12.5 mg total) by mouth 2 (two) times daily. Patient taking differently: Take 25 mg by mouth 2 (two) times daily. 06/09/19   Connye Burkitt, NP  OLANZapine (ZYPREXA) 7.5 MG tablet Take 7.5 mg by mouth at bedtime.    [provider]  tetrahydrozoline-zinc (VISINE-AC) 0.05-0.25 % ophthalmic solution Place 2 drops into both eyes 3 (three) times daily as needed.    [provider]  thiamine (VITAMIN B-1) 50 MG tablet Take 50 mg by mouth daily.    [provider]    Allergies Hydrocodone, Benadryl [diphenhydramine hcl (sleep)], and Hydrocortisone  Family History  Problem Relation Age of Onset  . Hypertension Mother   . Hypertension Father   . Stroke Father   . Hypertension Sister   . Hypertension Brother   . Cancer Brother   . Cancer Brother        throat cancer  . Hypertension Brother   . Diabetes Maternal Grandmother   . Colon cancer Neg Hx     Social History Social History   Tobacco Use  . Smoking status: Former Smoker    Quit date: 08/16/2004    Years since quitting: 15.9  . Smokeless tobacco: Current User  . Tobacco comment: no tobacco, has quit long time ago  Vaping Use  . Vaping Use: Never used  Substance Use Topics  . Alcohol use: Yes    Alcohol/week: 84.0 standard drinks    Types: 84 Cans of beer per week  . Drug use: No      Review of Systems Constitutional: No  fever/chills Eyes: No visual changes. ENT: No sore throat. Cardiovascular: Denies chest pain. Respiratory: Denies shortness of breath. Gastrointestinal: No abdominal pain.  No nausea, no vomiting.  No diarrhea.  No constipation. Genitourinary: Negative for dysuria. Musculoskeletal: Negative for back pain. Skin: Negative for rash. Neurological: Negative for headaches, focal weakness or numbness. Psych: Possible psychosis All other ROS negative ____________________________________________   PHYSICAL EXAM:  VITAL SIGNS: ED Triage Vitals  Enc Vitals Group     BP 07/26/20 1553 (!) 145/90     Pulse Rate 07/26/20 1553 (!) 110     Resp 07/26/20 1553 17     Temp 07/26/20 1553 98.4 F (36.9 C)     Temp Source 07/26/20 1553 Oral     SpO2 07/26/20 1553 100 %     Weight 07/26/20 1555 180 lb (81.6 kg)     Height 07/26/20 1555 6\' 2"  (1.88 m)     Head Circumference --      Peak Flow --      Pain Score 07/26/20 1555 0     Pain Loc --      Pain Edu? --      Excl. in [place]? --     Constitutional: Alert and oriented. Well appearing and in  no acute distress. Eyes: Conjunctivae are normal. No swelling around eyes Head: Atraumatic. Nose: No congestion/rhinnorhea. Mouth/Throat: Mucous membranes are moist.   Neck: No stridor. Trachea Midline. FROM Cardiovascular: Normal rate, no swelling noted Respiratory: No increased wob, no stridor Gastrointestinal: Soft and nontender. No distention. No abdominal bruits.  Musculoskeletal: No lower extremity tenderness nor edema.  No joint effusions. Neurologic:  Normal speech and language. No gross focal neurologic deficits are appreciated.  Skin:  Skin is warm, dry and intact. No rash noted. Psychiatric:  GU: Deferred   ____________________________________________   LABS (all labs ordered are listed, but only abnormal results are displayed)  Labs Reviewed  SALICYLATE LEVEL - Abnormal; Notable for the following components:      Result Value   Salicylate Lvl <3.5 (*)    All other components within normal limits  ACETAMINOPHEN LEVEL - Abnormal; Notable for the following components:   Acetaminophen (Tylenol), Serum <10 (*)    All other components within normal limits  CBC - Abnormal; Notable for the following components:   RBC 3.58 (*)    Hemoglobin 11.4 (*)    HCT 32.3 (*)    All other components within normal limits  RESP PANEL BY RT-PCR (FLU A&B, COVID) ARPGX2  ETHANOL  COMPREHENSIVE METABOLIC PANEL  URINE DRUG SCREEN, QUALITATIVE ([facility] ONLY)   ____________________________________________    PROCEDURES  Procedure(s) performed (including Critical Care):  Procedures   ____________________________________________   INITIAL IMPRESSION / ASSESSMENT AND PLAN / ED COURSE  [name] was evaluated in Emergency Department on 07/26/2020 for the symptoms described in the history of present illness. He was evaluated in the context of the global COVID-19 pandemic, which necessitated consideration that the patient might be at risk for infection with the SARS-CoV-2 virus that  causes COVID-19. Institutional protocols and algorithms that pertain to the evaluation of patients at risk for COVID-19 are in a state of rapid change based on information released by regulatory bodies including the CDC and federal and state organizations. These policies and algorithms were followed during the patient's care in the ED.    Pt is without any acute medical complaints. No exam findings to suggest medical cause of current presentation. Will order psychiatric screening labs and discuss  further w/ psychiatric service.  Alcohol level is 0 and will need to monitor for withdrawals.  We will place him on CIWA  D/d includes but is not limited to psychiatric disease, behavioral/personality disorder, inadequate socioeconomic support, medical.  Based on HPI, exam, unremarkable labs, no concern for acute medical problem at this time. No rigidity, clonus, hyperthermia, focal neurologic deficit, diaphoresis, tachycardia, meningismus, ataxia, gait abnormality or other finding to suggest this visit represents a non-psychiatric problem. Screening labs reviewed.    Given this, pt medically cleared, to be dispositioned per Psych.    The patient has been placed in psychiatric observation due to the need to provide a safe environment for the patient while obtaining psychiatric consultation and evaluation, as well as ongoing medical and medication management to treat the patient's condition.  The patient has been placed under full IVC at this time.        ____________________________________________   FINAL CLINICAL IMPRESSION(S) / ED DIAGNOSES   Final diagnoses:  Patient needs psychiatric hold for evaluation  Alcohol abuse      MEDICATIONS GIVEN DURING THIS VISIT:  Medications - No data to display   ED Discharge Orders    None       Note:  This document was prepared using Dragon voice recognition software and may include unintentional dictation errors.   Vanessa [place],  MD 07/26/20 1723

## 2020-07-26 NOTE — ED Notes (Signed)
IVC/ Consult completed recommend reassessment/ Moved to [place]

## 2020-07-26 NOTE — Consult Note (Signed)
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 male patient admitted with "there is no problem".  HPI: Patient seen chart reviewed.  60 year old man who is here in the emergency room yesterday but was released was brought in today by authorities who report that they found him in the [place] parking lot very confused sitting on top of a stranger's car.  Reportedly claimed that he thought the car belonged to the person who was going to drive him to [place].  I went to see him this afternoon.  He made only the briefest eye contact and then closed his eyes.  Grunted a couple of answers to questions but would not engage in any further conversation.  Seemed to indicate that he had not taken any medicine or any drugs today.  Reviewed labs.  Largely the same as yesterday.  Creatinine and bilirubin elevated for what ever reason.  No alcohol in his system today as compared to the positive alcohol level yesterday.  Past Psychiatric History: Patient has a long history of use of psychiatric services.  Various diagnoses have been thrown out at times including depression and delusional disorder but the most common factor appears to be his longstanding alcohol abuse which at times causes psychosis and hallucinations.  History of noncompliance with outpatient treatment.  Risk to Self:   Risk to Others:   Prior Inpatient Therapy:   Prior Outpatient Therapy:    Past Medical History:  Past Medical History:   Diagnosis Date  . Abdominal pain   . Adenomatous colon polyp 2008  . Anxiety   . Arthritis   . Back pain   . Bilateral lumbar radiculopathy   . Blurred vision, bilateral   . Chronic back pain   . Constipation   . Depression   . GERD (gastroesophageal reflux disease)   . Gout   . Hemorrhoids   . High cholesterol   . Hypertension   . Neck pain   . Neuropathy   . Neuropathy of both feet   . Numbness    rectal  . Numbness of legs   . Stroke ([place]) 2019  . Substance abuse ([place])    h/o excessive alcohol use; pt says he limits use to one to 2 beers daily he currently    Past Surgical History:  Procedure Laterality Date  . CATARACT EXTRACTION W/PHACO Right 08/16/2018   Procedure: CATARACT EXTRACTION PHACO AND INTRAOCULAR LENS PLACEMENT RIGHT EYE;  Surgeon: Baruch Goldmann, MD;  Location: [facility];  Service: Ophthalmology;  Laterality: Right;  CDE: 4.18  . COLONOSCOPY  10/09/2006   3 mm pedunculated sigmoid colon polyp removed/8-mm sessile hepatic flexure polyp (tubular villous adenoma) removed/6-mm  descending colon polyp removed/small internal hemorrhoids  . COLONOSCOPY  02/28/2011   PNT:IRWERX, multiple in the rectum/Internal hemorrhoids, MODERATE-CAUSING RECTAL BLEEDING  . COLONOSCOPY N/A 07/05/2016   Procedure: COLONOSCOPY;  Surgeon: [name], MD;  Location: [facility];  Service: Endoscopy;  Laterality: N/A;  11:15am  . 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 Mild erosive gastrtitis most likely due to indomethacin   . FINGER SURGERY Right    4th and 5th digit  . FLEXIBLE SIGMOIDOSCOPY  01/02/2012   SLF: 1. the colonic mucosa appeared normal in the sigmoid colon 2. Large internal hemorrhoids: cause for rectal bleeding/pain . s/p banding X 3   . HAND SURGERY    . REVERSE SHOULDER ARTHROPLASTY Left 05/29/2018   Procedure: REVERSE SHOULDER ARTHROPLASTY;  Surgeon: Hiram Gash, MD;  Location: [facility];  Service:  Orthopedics;  Laterality: Left;  . SAVORY DILATION N/A 07/21/2014   Procedure: SAVORY DILATION;  Surgeon: [name], MD;  Location: [facility];  Service: Endoscopy;  Laterality: N/A;   Family History:  Family History  Problem Relation Age of Onset  . Hypertension Mother   . Hypertension Father   . Stroke Father   . Hypertension Sister   . Hypertension Brother   . Cancer Brother   . Cancer Brother        throat cancer  . Hypertension Brother   . Diabetes Maternal Grandmother   . Colon cancer Neg Hx    Family Psychiatric  History: Unknown Social History:  Social History   Substance and Sexual Activity  Alcohol Use Yes  . Alcohol/week: 84.0 standard drinks  . Types: 84 Cans of beer per week     Social History   Substance and Sexual Activity  Drug Use No    Social History   Socioeconomic History  . Marital status: Single    Spouse name: Not on file  . Number of children: 0  . Years of education: 63  . Highest education level: Not on file  Occupational History  . Occupation: unemployed, Retail buyer: UNEMPLOYED  Tobacco Use  . Smoking status: Former Smoker    Quit date: 08/16/2004    Years since quitting: 15.9  . Smokeless tobacco: Current User  . Tobacco comment: no tobacco, has quit long time ago  Vaping Use  . Vaping Use: Never used  Substance and Sexual Activity  . Alcohol use: Yes    Alcohol/week: 84.0 standard drinks    Types: 84 Cans of beer per week  . Drug use: No  . Sexual activity: Not on file  Other Topics Concern  . Not on file  Social History Narrative   Lives alone   No caffeine   Right handed   Social Determinants of Health   Financial Resource Strain: Not on file  Food Insecurity: Not on file  Transportation Needs: Not on file  Physical Activity: Not on file  Stress: Not on file  Social Connections: Not on file   Additional Social History:    Allergies:   Allergies  Allergen Reactions  . Hydrocodone      hallucinations from hydrocodone Other reaction(s): Hallucination hallucinations from hydrocodone  . Benadryl [Diphenhydramine Hcl (Sleep)] Other (See Comments)    Palpitations & SOB  . Hydrocortisone     Labs:  Results for orders placed or performed during the hospital encounter of 07/26/20 (from the past 48 hour(s))  Comprehensive metabolic panel     Status: Abnormal   Collection Time: 07/26/20  3:56 PM  Result Value Ref Range   Sodium 138 135 - 145 mmol/L   Potassium 3.5 3.5 - 5.1 mmol/L   Chloride 108 98 - 111 mmol/L   CO2 19 (L) 22 - 32 mmol/L   Glucose, Bld 126 (H) 70 - 99 mg/dL    Comment:  Glucose reference range applies only to samples taken after fasting for at least 8 hours.   BUN 10 6 - 20 mg/dL   Creatinine, Ser 1.38 (H) 0.61 - 1.24 mg/dL   Calcium 9.1 8.9 - 10.3 mg/dL   Total Protein 8.0 6.5 - 8.1 g/dL   Albumin 3.7 3.5 - 5.0 g/dL   AST 55 (H) 15 - 41 U/L    Comment: RESULT CONFIRMED BY MANUAL DILUTION SKL   ALT 30 0 - 44 U/L   Alkaline Phosphatase 52 38 - 126 U/L   Total Bilirubin 2.3 (H) 0.3 - 1.2 mg/dL   GFR, Estimated 60 (L) >60 mL/min    Comment: (NOTE) Calculated using the CKD-EPI Creatinine Equation (2021)    Anion gap 11 5 - 15    Comment: Performed at [facility], [address] [place], [place] [address]  Ethanol     Status: None   Collection Time: 07/26/20  3:56 PM  Result Value Ref Range   Alcohol, Ethyl (B) <10 <10 mg/dL    Comment: (NOTE) Lowest detectable limit for serum alcohol is 10 mg/dL.  For medical purposes only. Performed at [facility], [address] [place], [place] [address]   Salicylate level     Status: Abnormal   Collection Time: 07/26/20  3:56 PM  Result Value Ref Range   Salicylate Lvl <6.2 (L) 7.0 - 30.0 mg/dL    Comment: Performed at [facility], [place], [place] [address]  Acetaminophen level     Status: Abnormal   Collection Time: 07/26/20  3:56 PM  Result Value Ref  Range   Acetaminophen (Tylenol), Serum <10 (L) 10 - 30 ug/mL    Comment: (NOTE) Therapeutic concentrations vary significantly. A range of 10-30 ug/mL  may be an effective concentration for many patients. However, some  are best treated at concentrations outside of this range. Acetaminophen concentrations >150 ug/mL at 4 hours after ingestion  and >50 ug/mL at 12 hours after ingestion are often associated with  toxic reactions.  Performed at [facility], [address] [place], [place] [address]   cbc     Status: Abnormal   Collection Time: 07/26/20  3:56 PM  Result Value Ref Range   WBC 10.1 4.0 - 10.5 K/uL   RBC 3.58 (L) 4.22 - 5.81 MIL/uL   Hemoglobin 11.4 (L) 13.0 - 17.0 g/dL   HCT 32.3 (L) 39.0 - 52.0 %   MCV 90.2 80.0 - 100.0 fL   MCH 31.8 26.0 - 34.0 pg   MCHC 35.3 30.0 - 36.0 g/dL   RDW 13.9 11.5 - 15.5 %   Platelets 216 150 - 400 K/uL   nRBC 0.0 0.0 - 0.2 %    Comment: Performed at [facility], [address] [place], [place] [address]    Current Facility-Administered Medications  Medication Dose Route Frequency Provider Last Rate Last Admin  . LORazepam (ATIVAN) injection 0-4 mg  0-4 mg Intravenous Q6H Vanessa [place], MD       Or  . LORazepam (ATIVAN) tablet 0-4 mg  0-4 mg Oral Q6H Vanessa [place], MD      . Derrill Memo ON 07/29/2020] LORazepam (ATIVAN) injection 0-4 mg  0-4 mg Intravenous Q12H Vanessa [place], MD       Or  . Derrill Memo ON 07/29/2020] LORazepam (ATIVAN) tablet 0-4 mg  0-4 mg Oral Q12H Vanessa , MD      . thiamine tablet 100 mg  100 mg Oral Daily Concha SeFunke, Mary E, MD       Or  . thiamine (B-1) injection 100 mg  100 mg Intravenous Daily Concha SeFunke, Mary E, MD       Current Outpatient Medications  Medication Sig Dispense Refill  . aspirin EC 81 MG tablet Take 1 tablet (81 mg total) by mouth daily. 30 tablet 1  . atorvastatin (LIPITOR) 20 MG tablet Take 20 mg by mouth daily.     [name]. gabapentin (NEURONTIN) 800 MG tablet Take 800 mg by mouth 3  (three) times daily.  (Patient not taking: Reported on 07/18/2020)    . hydrOXYzine (VISTARIL) 25 MG capsule Take 25 mg by mouth at bedtime as needed (insomnia).    . metoprolol tartrate (LOPRESSOR) 25 MG tablet Take 0.5 tablets (12.5 mg total) by mouth 2 (two) times daily. (Patient taking differently: Take 25 mg by mouth 2 (two) times daily.) 30 tablet 0  . OLANZapine (ZYPREXA) 7.5 MG tablet Take 7.5 mg by mouth at bedtime.    [name]. tetrahydrozoline-zinc (VISINE-AC) 0.05-0.25 % ophthalmic solution Place 2 drops into both eyes 3 (three) times daily as needed.    . thiamine (VITAMIN B-1) 50 MG tablet Take 50 mg by mouth daily.      Musculoskeletal: Strength & Muscle Tone: within normal limits Gait & Station: unsteady Patient leans: N/A            Psychiatric Specialty Exam:  Presentation  General Appearance: Appropriate for Environment; Well Groomed  Eye Contact:Fair  Speech:Clear and Coherent; Normal Rate  Speech Volume:Normal  Handedness:No data recorded  Mood and Affect  Mood:Depressed  Affect:Congruent; Depressed   Thought Process  Thought Processes:Coherent; Linear  Descriptions of Associations:Intact  Orientation:Full (Time, Place and Person)  Thought Content:Logical  History of Schizophrenia/Schizoaffective disorder:No  Duration of Psychotic Symptoms:No data recorded Hallucinations:No data recorded Ideas of Reference:None  Suicidal Thoughts:No data recorded Homicidal Thoughts:No data recorded  Sensorium  Memory:Immediate Good; Recent Good; Remote Good  Judgment:Fair  Insight:Fair   Executive Functions  Concentration:Good  Attention Span:Good  Recall:Good  Fund of Knowledge:Good  Language:Good   Psychomotor Activity  Psychomotor Activity:No data recorded  Assets  Assets:Communication Skills; Desire for Improvement; Physical Health; Resilience   Sleep  Sleep:No data recorded  Physical Exam: Physical Exam Vitals and nursing note  reviewed.  Constitutional:      Appearance: Normal appearance.  HENT:     Head: Normocephalic and atraumatic.     Mouth/Throat:     Pharynx: Oropharynx is clear.  Eyes:     Pupils: Pupils are equal, round, and reactive to light.  Cardiovascular:     Rate and Rhythm: Normal rate and regular rhythm.  Pulmonary:     Effort: Pulmonary effort is normal.     Breath sounds: Normal breath sounds.  Abdominal:     General: Abdomen is flat.     Palpations: Abdomen is soft.  Musculoskeletal:        General: Normal range of motion.  Skin:    General: Skin is warm and dry.  Neurological:     General: No focal deficit present.     Mental Status: Mental status is at baseline.  Psychiatric:        Attention and Perception: He is inattentive.        Mood and Affect: Mood normal. Affect is blunt.        Speech: He is noncommunicative.    Review of Systems  Unable to perform ROS: Psychiatric disorder  Blood pressure (!) 145/90, pulse (!) 110, temperature 98.4 F (36.9 C), temperature source Oral, resp. rate 17, height 6\' 2"  (1.88 m), weight 81.6 kg, SpO2 100 %. Body mass index is 23.11 kg/m.  Treatment Plan Summary: Plan Currently patient is not open to assessment.  Possibly could just be exhausted and coming off of alcohol.  Does not at this moment appear to be having DTs.  No aggression or violence.  Unlikely to need inpatient hospitalization but needs further reassessment.  Case reviewed with emergency room physician.  Needs reassessment when he is more awake.  Disposition: Supportive therapy provided about ongoing stressors. Reassess when more awake  Alethia Berthold, MD 07/26/2020 5:51 PM

## 2020-07-26 NOTE — ED Notes (Signed)
IVC, pend consult 

## 2020-07-26 NOTE — ED Notes (Signed)
Pt states she should not be here and is suppose to be in [place] now if he hadn't been brought here.

## 2020-07-26 NOTE — ED Notes (Signed)
Report received from [place], Conservation officer, nature. Patient alert and oriented, warm and dry, and in no acute distress. Patient denies SI, HI, AVH and pain. Patient made aware of Q15 minute rounds and Engineer, drilling presence for their safety. Patient instructed to come to this nurse with needs or concerns.

## 2020-07-26 NOTE — ED Triage Notes (Addendum)
Pt via [employer] under IVC. Pt has been wandering around the [employer] parking lot, he was sitting on someone's vehicle that he did not know. Pt states he is suppose to going to [place] and thought the person's car he was sitting on was suppose to be taking him to [place]. Pt denies AVH. Denies SI/HI. Pt calm and cooperative at this time. Pt has a hx of alcohol induced psychosis.

## 2020-07-26 NOTE — BH Assessment (Signed)
TTS attempted to speak with patient. Patient unable to stay awake during assessment. Patient reports he is annoyed with the police and does not want to be there. Patient mumbled words and went back to sleep.

## 2020-07-26 NOTE — ED Notes (Signed)
Hourly rounding performed, patient currently awake in room. Patient has no complaints at this time. Q15 minute rounds and monitoring via Rover and Officer to continue. 

## 2020-07-27 NOTE — ED Notes (Signed)
Breakfast tray given at this time.  

## 2020-07-27 NOTE — ED Notes (Signed)
E-signature not working at this time. Pt verbalized understanding of D/C instructions, prescriptions and follow up care with no further questions at this time. Pt in NAD and ambulatory at time of D/C.  

## 2020-07-27 NOTE — Consult Note (Signed)
[facility] Face-to-Face Psychiatry Consult   Reason for Consult: Consult for 59 year old man who came to the hospital with altered mental status having been found passed out in public. 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 male patient admitted with "I just had some problems because I am homeless".  HPI: Patient seen chart reviewed.  Much more pleasant and interactive today.  He remembers being brought to the hospital by the police because he was peeking into cars in the parking lot.  He says that he knows that behavior was strange but that he was doing it because he was certain that someone had promised to give him a ride to [place] and he thought he knew what kind of car they were driving.  Patient is homeless staying here and there.  Minimizes his drinking saying he is drinking less than he ever did before.  Denies other drug use.  Today he is calm and appropriate.  Denies hallucinations.  Denies suicidal or homicidal ideation.  Does not seem obviously confused.  No aggressive behavior.  Past Psychiatric History: Longstanding alcohol abuse with mental health complications both in intoxication and withdrawal  Risk to Self:   Risk to Others:   Prior Inpatient Therapy:   Prior Outpatient Therapy:    Past Medical History:  Past Medical History:  Diagnosis Date  . Abdominal pain   . Adenomatous colon polyp 2008  . Anxiety   . Arthritis   . Back pain   . Bilateral lumbar radiculopathy   . Blurred vision, bilateral   . Chronic back pain   . Constipation   . Depression   . GERD (gastroesophageal reflux disease)   . Gout   . Hemorrhoids   .  High cholesterol   . Hypertension   . Neck pain   . Neuropathy   . Neuropathy of both feet   . Numbness    rectal  . Numbness of legs   . Stroke ([place]) 2019  . Substance abuse ([place])    h/o excessive alcohol use; pt says he limits use to one to 2 beers daily he currently    Past Surgical History:  Procedure Laterality Date  . CATARACT EXTRACTION W/PHACO Right 08/16/2018   Procedure: CATARACT EXTRACTION PHACO AND INTRAOCULAR LENS PLACEMENT RIGHT EYE;  Surgeon: Baruch Goldmann, MD;  Location: [facility];  Service: Ophthalmology;  Laterality: Right;  CDE: 4.18  . COLONOSCOPY  10/09/2006   3 mm pedunculated sigmoid colon polyp removed/8-mm sessile hepatic flexure polyp (tubular villous adenoma) removed/6-mm  descending colon polyp removed/small internal hemorrhoids  . COLONOSCOPY  02/28/2011   HYW:[id number], multiple in the rectum/Internal hemorrhoids, MODERATE-CAUSING RECTAL BLEEDING  . COLONOSCOPY N/A 07/05/2016   Procedure: COLONOSCOPY;  Surgeon: [name], MD;  Location: [facility];  Service: Endoscopy;  Laterality: N/A;  11:15am  . 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 Mild erosive gastrtitis most likely due to indomethacin   . FINGER SURGERY Right    4th and 5th digit  . FLEXIBLE SIGMOIDOSCOPY  01/02/2012   SLF: 1. the colonic mucosa appeared normal in the sigmoid colon 2. Large internal hemorrhoids: cause for rectal bleeding/pain . s/p banding  X 3   . HAND SURGERY    . REVERSE SHOULDER ARTHROPLASTY Left 05/29/2018   Procedure: REVERSE SHOULDER ARTHROPLASTY;  Surgeon: Hiram Gash, MD;  Location: [facility];  Service: Orthopedics;  Laterality: Left;  . SAVORY DILATION N/A 07/21/2014   Procedure: SAVORY DILATION;  Surgeon: [name], MD;  Location: [facility];  Service: Endoscopy;  Laterality: N/A;   Family History:  Family History  Problem Relation Age of Onset  . Hypertension Mother   . Hypertension Father   . Stroke  Father   . Hypertension Sister   . Hypertension Brother   . Cancer Brother   . Cancer Brother        throat cancer  . Hypertension Brother   . Diabetes Maternal Grandmother   . Colon cancer Neg Hx    Family Psychiatric  History: See previous Social History:  Social History   Substance and Sexual Activity  Alcohol Use Yes  . Alcohol/week: 84.0 standard drinks  . Types: 84 Cans of beer per week     Social History   Substance and Sexual Activity  Drug Use No    Social History   Socioeconomic History  . Marital status: Single    Spouse name: Not on file  . Number of children: 0  . Years of education: 20  . Highest education level: Not on file  Occupational History  . Occupation: unemployed, Retail buyer: UNEMPLOYED  Tobacco Use  . Smoking status: Former Smoker    Quit date: 08/16/2004    Years since quitting: 15.9  . Smokeless tobacco: Current User  . Tobacco comment: no tobacco, has quit long time ago  Vaping Use  . Vaping Use: Never used  Substance and Sexual Activity  . Alcohol use: Yes    Alcohol/week: 84.0 standard drinks    Types: 84 Cans of beer per week  . Drug use: No  . Sexual activity: Not on file  Other Topics Concern  . Not on file  Social History Narrative   Lives alone   No caffeine   Right handed   Social Determinants of Health   Financial Resource Strain: Not on file  Food Insecurity: Not on file  Transportation Needs: Not on file  Physical Activity: Not on file  Stress: Not on file  Social Connections: Not on file   Additional Social History:    Allergies:   Allergies  Allergen Reactions  . Hydrocodone     hallucinations from hydrocodone Other reaction(s): Hallucination hallucinations from hydrocodone  . Benadryl [Diphenhydramine Hcl (Sleep)] Other (See Comments)    Palpitations & SOB  . Hydrocortisone     Labs:  Results for orders placed or performed during the hospital encounter of 07/26/20 (from the past 48  hour(s))  Comprehensive metabolic panel     Status: Abnormal   Collection Time: 07/26/20  3:56 PM  Result Value Ref Range   Sodium 138 135 - 145 mmol/L   Potassium 3.5 3.5 - 5.1 mmol/L   Chloride 108 98 - 111 mmol/L   CO2 19 (L) 22 - 32 mmol/L   Glucose, Bld 126 (H) 70 - 99 mg/dL    Comment: Glucose reference range applies only to samples taken after fasting for at least 8 hours.   BUN 10 6 - 20 mg/dL   Creatinine, Ser 1.38 (H) 0.61 - 1.24 mg/dL   Calcium 9.1 8.9 - 10.3 mg/dL   Total Protein 8.0 6.5 - 8.1 g/dL  Albumin 3.7 3.5 - 5.0 g/dL   AST 55 (H) 15 - 41 U/L    Comment: RESULT CONFIRMED BY MANUAL DILUTION SKL   ALT 30 0 - 44 U/L   Alkaline Phosphatase 52 38 - 126 U/L   Total Bilirubin 2.3 (H) 0.3 - 1.2 mg/dL   GFR, Estimated 60 (L) >60 mL/min    Comment: (NOTE) Calculated using the CKD-EPI Creatinine Equation (2021)    Anion gap 11 5 - 15    Comment: Performed at [facility], [address] [place], [place] [address]  Ethanol     Status: None   Collection Time: 07/26/20  3:56 PM  Result Value Ref Range   Alcohol, Ethyl (B) <10 <10 mg/dL    Comment: (NOTE) Lowest detectable limit for serum alcohol is 10 mg/dL.  For medical purposes only. Performed at [facility], [address] [place], [place] [address]   Salicylate level     Status: Abnormal   Collection Time: 07/26/20  3:56 PM  Result Value Ref Range   Salicylate Lvl <2.9 (L) 7.0 - 30.0 mg/dL    Comment: Performed at [facility], [place], [place] [address]  Acetaminophen level     Status: Abnormal   Collection Time: 07/26/20  3:56 PM  Result Value Ref Range   Acetaminophen (Tylenol), Serum <10 (L) 10 - 30 ug/mL    Comment: (NOTE) Therapeutic concentrations vary significantly. A range of 10-30 ug/mL  may be an effective concentration for many patients. However, some  are best treated at concentrations outside of this range. Acetaminophen concentrations >150  ug/mL at 4 hours after ingestion  and >50 ug/mL at 12 hours after ingestion are often associated with  toxic reactions.  Performed at [facility], [address] [place], [place] [address]   cbc     Status: Abnormal   Collection Time: 07/26/20  3:56 PM  Result Value Ref Range   WBC 10.1 4.0 - 10.5 K/uL   RBC 3.58 (L) 4.22 - 5.81 MIL/uL   Hemoglobin 11.4 (L) 13.0 - 17.0 g/dL   HCT 32.3 (L) 39.0 - 52.0 %   MCV 90.2 80.0 - 100.0 fL   MCH 31.8 26.0 - 34.0 pg   MCHC 35.3 30.0 - 36.0 g/dL   RDW 13.9 11.5 - 15.5 %   Platelets 216 150 - 400 K/uL   nRBC 0.0 0.0 - 0.2 %    Comment: Performed at [facility], [address], [place], [place] [address]  Resp Panel by RT-PCR (Flu A&B, Covid) Nasopharyngeal Swab     Status: None   Collection Time: 07/26/20  6:11 PM   Specimen: Nasopharyngeal Swab; Nasopharyngeal(NP) swabs in vial transport medium  Result Value Ref Range   SARS Coronavirus 2 by RT PCR NEGATIVE NEGATIVE    Comment: (NOTE) SARS-CoV-2 target nucleic acids are NOT DETECTED.  The SARS-CoV-2 RNA is generally detectable in upper respiratory specimens during the acute phase of infection. The lowest concentration of SARS-CoV-2 viral copies this assay can detect is 138 copies/mL. A negative result does not preclude SARS-Cov-2 infection and should not be used as the sole basis for treatment or other patient management decisions. A negative result may occur with  improper specimen collection/handling, submission of specimen other than nasopharyngeal swab, presence of viral mutation(s) within the areas targeted by this assay, and inadequate number of viral copies(<138 copies/mL). A negative result must be combined with clinical observations, patient history, and epidemiological information.  The expected result is Negative.  Fact Sheet for Patients:  EntrepreneurPulse.com.au  Fact Sheet for Healthcare Providers:   IncredibleEmployment.be  This test is no t yet approved or cleared by the [place] FDA and  has been authorized for detection and/or diagnosis of SARS-CoV-2 by FDA under an Emergency Use Authorization (EUA). This EUA will remain  in effect (meaning this test can be used) for the duration of the COVID-19 declaration under Section 564(b)(1) of the Act, 21 U.S.C.section 360bbb-3(b)(1), unless the authorization is terminated  or revoked sooner.       Influenza A by PCR NEGATIVE NEGATIVE   Influenza B by PCR NEGATIVE NEGATIVE    Comment: (NOTE) The Xpert Xpress SARS-CoV-2/FLU/RSV plus assay is intended as an aid in the diagnosis of influenza from Nasopharyngeal swab specimens and should not be used as a sole basis for treatment. Nasal washings and aspirates are unacceptable for Xpert Xpress SARS-CoV-2/FLU/RSV testing.  Fact Sheet for Patients: EntrepreneurPulse.com.au  Fact Sheet for Healthcare Providers: IncredibleEmployment.be  This test is not yet approved or cleared by the [place] FDA and has been authorized for detection and/or diagnosis of SARS-CoV-2 by FDA under an Emergency Use Authorization (EUA). This EUA will remain in effect (meaning this test can be used) for the duration of the COVID-19 declaration under Section 564(b)(1) of the Act, 21 U.S.C. section 360bbb-3(b)(1), unless the authorization is terminated or revoked.  Performed at [facility], [address] [place], [place] [address]     Current Facility-Administered Medications  Medication Dose Route Frequency Provider Last Rate Last Admin  . LORazepam (ATIVAN) injection 0-4 mg  0-4 mg Intravenous Q6H Vanessa Kurten, MD       Or  . LORazepam (ATIVAN) tablet 0-4 mg  0-4 mg Oral Q6H Vanessa [place], MD   1 mg at 07/26/20 2000  . [START ON 07/29/2020] LORazepam (ATIVAN) injection 0-4 mg  0-4 mg Intravenous Q12H Vanessa [place], MD       Or   . Derrill Memo ON 07/29/2020] LORazepam (ATIVAN) tablet 0-4 mg  0-4 mg Oral Q12H Vanessa [place], MD      . thiamine tablet 100 mg  100 mg Oral Daily Vanessa Dowell, MD   100 mg at 07/27/20 1914   Or  . thiamine (B-1) injection 100 mg  100 mg Intravenous Daily Vanessa [place], MD       Current Outpatient Medications  Medication Sig Dispense Refill  . aspirin EC 81 MG tablet Take 1 tablet (81 mg total) by mouth daily. 30 tablet 1  . atorvastatin (LIPITOR) 20 MG tablet Take 20 mg by mouth daily.     [name] gabapentin (NEURONTIN) 800 MG tablet Take 800 mg by mouth 3 (three) times daily.  (Patient not taking: Reported on 07/18/2020)    . hydrOXYzine (VISTARIL) 25 MG capsule Take 25 mg by mouth at bedtime as needed (insomnia).    . metoprolol tartrate (LOPRESSOR) 25 MG tablet Take 0.5 tablets (12.5 mg total) by mouth 2 (two) times daily. (Patient taking differently: Take 25 mg by mouth 2 (two) times daily.) 30 tablet 0  . OLANZapine (ZYPREXA) 7.5 MG tablet Take 7.5 mg by mouth at bedtime.    [name] tetrahydrozoline-zinc (VISINE-AC) 0.05-0.25 % ophthalmic solution Place 2 drops into both eyes 3 (three) times daily as needed.    . thiamine (VITAMIN B-1) 50 MG tablet Take 50 mg by mouth daily.      Musculoskeletal: Strength & Muscle Tone: within normal  limits Gait & Station: normal Patient leans: N/A            Psychiatric Specialty Exam:  Presentation  General Appearance: Appropriate for Environment; Well Groomed  Eye Contact:Fair  Speech:Clear and Coherent; Normal Rate  Speech Volume:Normal  Handedness:No data recorded  Mood and Affect  Mood:Depressed  Affect:Congruent; Depressed   Thought Process  Thought Processes:Coherent; Linear  Descriptions of Associations:Intact  Orientation:Full (Time, Place and Person)  Thought Content:Logical  History of Schizophrenia/Schizoaffective disorder:No  Duration of Psychotic Symptoms:No data recorded Hallucinations:No data recorded Ideas of  Reference:None  Suicidal Thoughts:No data recorded Homicidal Thoughts:No data recorded  Sensorium  Memory:Immediate Good; Recent Good; Remote Good  Judgment:Fair  Insight:Fair   Executive Functions  Concentration:Good  Attention Span:Good  [place] of Knowledge:Good  Language:Good   Psychomotor Activity  Psychomotor Activity:No data recorded  Assets  Assets:Communication Skills; Desire for Improvement; Physical Health; Resilience   Sleep  Sleep:No data recorded  Physical Exam: Physical Exam Vitals and nursing note reviewed.  Constitutional:      Appearance: Normal appearance.  HENT:     Head: Normocephalic and atraumatic.     Mouth/Throat:     Pharynx: Oropharynx is clear.  Eyes:     Pupils: Pupils are equal, round, and reactive to light.  Cardiovascular:     Rate and Rhythm: Normal rate and regular rhythm.  Pulmonary:     Effort: Pulmonary effort is normal.     Breath sounds: Normal breath sounds.  Abdominal:     General: Abdomen is flat.     Palpations: Abdomen is soft.  Musculoskeletal:        General: Normal range of motion.  Skin:    General: Skin is warm and dry.  Neurological:     General: No focal deficit present.     Mental Status: He is alert. Mental status is at baseline.  Psychiatric:        Mood and Affect: Mood normal.        Thought Content: Thought content normal.    Review of Systems  Constitutional: Negative.   HENT: Negative.   Eyes: Negative.   Respiratory: Negative.   Cardiovascular: Negative.   Gastrointestinal: Negative.   Musculoskeletal: Negative.   Skin: Negative.   Neurological: Negative.   Psychiatric/Behavioral: Positive for memory loss and substance abuse. Negative for depression, hallucinations and suicidal ideas. The patient is not nervous/anxious.    Blood pressure 133/86, pulse 87, temperature 98.4 F (36.9 C), temperature source Oral, resp. rate 19, height 6\' 2"  (1.88 m), weight 81.6 kg, SpO2  100 %. Body mass index is 23.11 kg/m.  Treatment Plan Summary: Plan Patient is now sober and not showing active signs of withdrawal.  No delirium.  Not shaky or agitated.  No indication for IVC.  Discontinued the commitment paperwork.  Patient says he is going to [place] very soon and does not need follow-up locally.  Reviewed with him the dangers of continued drinking and encouraged him to get into outpatient mental health treatment.  Case reviewed with emergency room physician and TTS.  He can be discharged from the ER.  Disposition: No evidence of imminent risk to self or others at present.   Patient does not meet criteria for psychiatric inpatient admission.  Alethia Berthold, MD 07/27/2020 3:57 PM

## 2020-07-27 NOTE — Discharge Instructions (Signed)
Please seek alcohol abuse counseling and treatment when you arrive in your new location.

## 2020-07-27 NOTE — ED Provider Notes (Signed)
Emergency Medicine Observation Re-evaluation Note  Tristan Cook is a 60 y.o. male, seen on rounds today.  Pt initially presented to the ED for complaints of IVC  Currently, the patient is calm, no acute complaints.  Physical Exam  Blood pressure 133/86, pulse 87, temperature 98.4 F (36.9 C), temperature source Oral, resp. rate 19, height 6\' 2"  (1.88 m), weight 81.6 kg, SpO2 100 %. Physical Exam General: NAD Lungs: CTAB Psych: not agitated  ED Course / MDM  EKG:    I have reviewed the labs performed to date as well as medications administered while in observation.  Recent changes in the last 24 hours include no acute events overnight.  Seen by psychiatry today who finds the patient lucid, no evidence of intoxication or withdrawal, denying any concerning symptoms.  Psychiatry has rescinded IVC and recommends discharge.  Plan  Current plan is for discharge. Patient is not under full IVC at this time.   Carrie Mew, MD 07/27/20 [phone]

## 2020-08-03 ENCOUNTER — Other Ambulatory Visit: Payer: Self-pay

## 2020-08-03 ENCOUNTER — Emergency Department
Admission: EM | Admit: 2020-08-03 | Discharge: 2020-08-04 | Disposition: A | Discharge status: Home / Self Care | Payer: 59 | Attending: Emergency Medicine | Admitting: Emergency Medicine

## 2020-08-03 DIAGNOSIS — Z20822 Contact with and (suspected) exposure to covid-19: Secondary | ICD-10-CM | POA: Insufficient documentation

## 2020-08-03 DIAGNOSIS — Z79899 Other long term (current) drug therapy: Secondary | ICD-10-CM | POA: Insufficient documentation

## 2020-08-03 DIAGNOSIS — Z87891 Personal history of nicotine dependence: Secondary | ICD-10-CM | POA: Insufficient documentation

## 2020-08-03 DIAGNOSIS — I10 Essential (primary) hypertension: Secondary | ICD-10-CM | POA: Insufficient documentation

## 2020-08-03 DIAGNOSIS — F23 Brief psychotic disorder: Secondary | ICD-10-CM | POA: Diagnosis present

## 2020-08-03 DIAGNOSIS — L97519 Non-pressure chronic ulcer of other part of right foot with unspecified severity: Secondary | ICD-10-CM | POA: Diagnosis not present

## 2020-08-03 DIAGNOSIS — G621 Alcoholic polyneuropathy: Secondary | ICD-10-CM | POA: Diagnosis present

## 2020-08-03 DIAGNOSIS — Z7982 Long term (current) use of aspirin: Secondary | ICD-10-CM | POA: Insufficient documentation

## 2020-08-03 DIAGNOSIS — F10951 Alcohol use, unspecified with alcohol-induced psychotic disorder with hallucinations: Secondary | ICD-10-CM | POA: Diagnosis present

## 2020-08-03 DIAGNOSIS — S91109A Unspecified open wound of unspecified toe(s) without damage to nail, initial encounter: Secondary | ICD-10-CM

## 2020-08-03 DIAGNOSIS — Z046 Encounter for general psychiatric examination, requested by authority: Secondary | ICD-10-CM | POA: Insufficient documentation

## 2020-08-03 DIAGNOSIS — F1010 Alcohol abuse, uncomplicated: Secondary | ICD-10-CM | POA: Diagnosis present

## 2020-08-03 DIAGNOSIS — R443 Hallucinations, unspecified: Secondary | ICD-10-CM | POA: Diagnosis present

## 2020-08-03 DIAGNOSIS — R410 Disorientation, unspecified: Secondary | ICD-10-CM

## 2020-08-03 DIAGNOSIS — F10929 Alcohol use, unspecified with intoxication, unspecified: Secondary | ICD-10-CM | POA: Diagnosis present

## 2020-08-03 DIAGNOSIS — F10151 Alcohol abuse with alcohol-induced psychotic disorder with hallucinations: Secondary | ICD-10-CM | POA: Diagnosis not present

## 2020-08-03 LAB — COMPREHENSIVE METABOLIC PANEL
ALT: 28 U/L (ref 0–44)
AST: 50 U/L — ABNORMAL HIGH (ref 15–41)
Albumin: 3.1 g/dL — ABNORMAL LOW (ref 3.5–5.0)
Alkaline Phosphatase: 50 U/L (ref 38–126)
Anion gap: 13 (ref 5–15)
BUN: 19 mg/dL (ref 6–20)
CO2: 22 mmol/L (ref 22–32)
Calcium: 9.2 mg/dL (ref 8.9–10.3)
Chloride: 96 mmol/L — ABNORMAL LOW (ref 98–111)
Creatinine, Ser: 1.24 mg/dL (ref 0.61–1.24)
GFR, Estimated: >60 mL/min (ref >60)
Glucose, Bld: 130 mg/dL — ABNORMAL HIGH (ref 70–99)
Potassium: 3.2 mmol/L — ABNORMAL LOW (ref 3.5–5.1)
Sodium: 131 mmol/L — ABNORMAL LOW (ref 135–145)
Total Bilirubin: 1.5 mg/dL — ABNORMAL HIGH (ref 0.3–1.2)
Total Protein: 8.3 g/dL — ABNORMAL HIGH (ref 6.5–8.1)

## 2020-08-03 LAB — CBC
HCT: 30.9 % — ABNORMAL LOW (ref 39.0–52.0)
Hemoglobin: 11.2 g/dL — ABNORMAL LOW (ref 13.0–17.0)
MCH: 31.9 pg (ref 26.0–34.0)
MCHC: 36.2 g/dL — ABNORMAL HIGH (ref 30.0–36.0)
MCV: 88 fL (ref 80.0–100.0)
Platelets: 483 10*3/uL — ABNORMAL HIGH (ref 150–400)
RBC: 3.51 MIL/uL — ABNORMAL LOW (ref 4.22–5.81)
RDW: 13.2 % (ref 11.5–15.5)
WBC: 20.6 10*3/uL — ABNORMAL HIGH (ref 4.0–10.5)
nRBC: 0 % (ref 0.0–0.2)

## 2020-08-03 LAB — ETHANOL: Alcohol, Ethyl (B): <10 mg/dL (ref <10)

## 2020-08-03 LAB — SALICYLATE LEVEL: Salicylate Lvl: <7 mg/dL — ABNORMAL LOW (ref 7.0–30.0)

## 2020-08-03 LAB — ACETAMINOPHEN LEVEL: Acetaminophen (Tylenol), Serum: <10 ug/mL — ABNORMAL LOW (ref 10–30)

## 2020-08-03 MED ORDER — OLANZAPINE 5 MG PO TABS: 7.5000 mg | ORAL_TABLET | Freq: Every day | ORAL | Status: DC

## 2020-08-03 MED ORDER — HYDROXYZINE HCL 25 MG PO TABS: 25.0000 mg | ORAL_TABLET | Freq: Every evening | ORAL | Status: DC | PRN

## 2020-08-03 MED ORDER — ATORVASTATIN CALCIUM 20 MG PO TABS
20.0000 mg | ORAL_TABLET | Freq: Every day | ORAL | Status: DC
  Administered 2020-08-04: 20 mg via ORAL
  Filled 2020-08-03: qty 1

## 2020-08-03 MED ORDER — THIAMINE HCL 100 MG PO TABS
50.0000 mg | ORAL_TABLET | Freq: Every day | ORAL | Status: DC
  Administered 2020-08-04: 50 mg via ORAL
  Filled 2020-08-03: qty 1

## 2020-08-03 MED ORDER — METOPROLOL TARTRATE 25 MG PO TABS
25.0000 mg | ORAL_TABLET | Freq: Two times a day (BID) | ORAL | Status: DC
  Administered 2020-08-04: 25 mg via ORAL
  Filled 2020-08-03: qty 1

## 2020-08-03 MED ORDER — DOXYCYCLINE HYCLATE 100 MG PO TABS
100.0000 mg | ORAL_TABLET | Freq: Two times a day (BID) | ORAL | Status: DC
  Administered 2020-08-03 – 2020-08-04 (×2): 100 mg via ORAL
  Filled 2020-08-03 (×3): qty 1

## 2020-08-03 MED ORDER — ASPIRIN EC 81 MG PO TBEC
81.0000 mg | DELAYED_RELEASE_TABLET | Freq: Every day | ORAL | Status: DC
  Administered 2020-08-04: 81 mg via ORAL
  Filled 2020-08-03: qty 1

## 2020-08-03 NOTE — ED Notes (Signed)
Patient able to walk on affected extremity but with pain. Patient states it has been this way for 3 days. Patient states he had to walk around a lot and that is why the wound developed. Pt denies psychiatric concerns at this time. Pt denies SI/HI, AH/VH. Pt confused, but appears to be baseline to this RN from prior visits.

## 2020-08-03 NOTE — ED Triage Notes (Addendum)
Patient to ED with [facility] [employer] under IVC.  Per Officer with patient IVC paperwork taken out by another officer.  Patient was found wandering around a grocery store parking lot disoriented and confused, they were unable to locate any address for patient, patient refused to be seen by EMS.  Patient with noted edema to bilateral ankles that patient states has been that way for 3 days.

## 2020-08-03 NOTE — ED Notes (Signed)
Patient personal items placed in labeled belongings bag to be secured on unit:  White colored tennis shoes, blue colored shirt, black colored zip jacket.

## 2020-08-03 NOTE — ED Notes (Signed)
Unable to provide patient with hospital approved scrub pants, patient's blue colored sweat pants wanded by security and nothing harmful found.

## 2020-08-03 NOTE — ED Notes (Signed)
Patient with open wound to sole of right foot.  Patient reports area has been like that for 3 days.

## 2020-08-03 NOTE — ED Provider Notes (Signed)
[facility] Emergency Department Provider Note   ____________________________________________   Event Date/Time   First MD Initiated Contact with Patient 08/03/20 1939     (approximate)  I have reviewed the triage vital signs and the nursing notes.   HISTORY  Chief Complaint Psychiatric Evaluation    HPI Tristan Cook is a 60 y.o. male with the below stated past medical history who presents under IVC by [facility] police after IVC paperwork was taken out due to patient wandering around a grocery store parking lot disoriented and confused.  They were told by patient that he had a psychiatric history and therefore was brought to the emergency department.  Patient states that he has had swelling as well as a wound to his foot for the last 3 days and states that it came from walking too much.  Patient currently denies any SI, HI, or AVH.  Patient continues to be confused and therefore further history and review of systems are unable to be obtained at this time         Past Medical History:  Diagnosis Date  . Abdominal pain   . Adenomatous colon polyp 2008  . Anxiety   . Arthritis   . Back pain   . Bilateral lumbar radiculopathy   . Blurred vision, bilateral   . Chronic back pain   . Constipation   . Depression   . GERD (gastroesophageal reflux disease)   . Gout   . Hemorrhoids   . High cholesterol   . Hypertension   . Neck pain   . Neuropathy   . Neuropathy of both feet   . Numbness    rectal  . Numbness of legs   . Stroke ([place]) 2019  . Substance abuse ([place])    h/o excessive alcohol use; pt says he limits use to one to 2 beers daily he currently    Patient Active Problem List   Diagnosis Date Noted  . Homelessness 10/04/2019  . 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 Acute psychosis ([place]) 06/05/2019  . Delusional disorder ([place]) 05/24/2019  . Swelling of limb 03/11/2019  . Peripheral polyneuropathy  02/12/2019  . Alcohol intoxication ([place]) 08/27/2018  . Hallucinations 08/27/2018  . 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 Alcohol use disorder, severe, dependence ([place]) 08/27/2018  . TIA (transient ischemic attack) 05/29/2018  . Syncope 05/29/2018  . Alcohol abuse 05/29/2018  . Fall 05/29/2018  . Dislocation of left shoulder joint 05/29/2018  . Closed fracture of left proximal humerus 05/29/2018  . Abnormal LFTs 05/29/2018  . Hypokalemia 05/29/2018  . Constipation 10/17/2017  . Hoarseness 10/17/2017  . Weakness 04/11/2017  . Weakness of both legs 04/10/2017  . Vitamin B deficiency 01/22/2017  . 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 Stroke, small vessel ([place]) 01/17/2017  . 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 Vitamin B12 deficiency neuropathy ([place]) 01/17/2017  . Adjustment disorder with depressed mood 08/15/2016  . History of colonic polyps 06/14/2016  . Elevated blood pressure reading 06/14/2016  . Hemorrhoids 09/20/2015  . Depressive disorder 08/03/2015  . Back pain with radiation 06/15/2015  . Insomnia 06/03/2015  . Hyperlipidemia LDL goal <130 04/25/2015  . 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 Fatigue 03/29/2015  . Allergic rhinitis 02/10/2015  . Esophageal stricture 10/22/2014  . Overweight (BMI 25.0-29.9) 10/06/2013  . GERD (gastroesophageal reflux disease) 04/23/2012  . Hemorrhoids, internal 11/02/2011  . Essential hypertension 07/25/2007  . Neck pain on left side  07/25/2007    Past Surgical History:  Procedure Laterality Date  . CATARACT EXTRACTION W/PHACO Right 08/16/2018   Procedure: CATARACT EXTRACTION PHACO AND INTRAOCULAR LENS PLACEMENT RIGHT EYE;  Surgeon: Baruch Goldmann, MD;  Location: [facility];  Service: Ophthalmology;  Laterality: Right;  CDE: 4.18  . COLONOSCOPY  10/09/2006   3 mm pedunculated sigmoid colon polyp removed/8-mm sessile hepatic flexure polyp (tubular villous adenoma) removed/6-mm  descending  colon polyp removed/small internal hemorrhoids  . COLONOSCOPY  02/28/2011   CLE:XNTZGY, multiple in the rectum/Internal hemorrhoids, MODERATE-CAUSING RECTAL BLEEDING  . COLONOSCOPY N/A 07/05/2016   Procedure: COLONOSCOPY;  Surgeon: [name], MD;  Location: [facility];  Service: Endoscopy;  Laterality: N/A;  11:15am  . 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 Mild erosive gastrtitis most likely due to indomethacin   . FINGER SURGERY Right    4th and 5th digit  . FLEXIBLE SIGMOIDOSCOPY  01/02/2012   SLF: 1. the colonic mucosa appeared normal in the sigmoid colon 2. Large internal hemorrhoids: cause for rectal bleeding/pain . s/p banding X 3   . HAND SURGERY    . REVERSE SHOULDER ARTHROPLASTY Left 05/29/2018   Procedure: REVERSE SHOULDER ARTHROPLASTY;  Surgeon: Hiram Gash, MD;  Location: [facility];  Service: Orthopedics;  Laterality: Left;  . SAVORY DILATION N/A 07/21/2014   Procedure: SAVORY DILATION;  Surgeon: [name], MD;  Location: [facility];  Service: Endoscopy;  Laterality: N/A;    Prior to Admission medications   Medication Sig Start Date End Date Taking? Authorizing Provider  aspirin EC 81 MG tablet Take 1 tablet (81 mg total) by mouth daily. 09/04/18   Clapacs, Madie Reno, MD  atorvastatin (LIPITOR) 20 MG tablet Take 20 mg by mouth daily.     [provider]  gabapentin (NEURONTIN) 800 MG tablet Take 800 mg by mouth 3 (three) times daily.  Patient not taking: Reported on 07/18/2020 08/06/19 08/05/20  [provider]  hydrOXYzine (VISTARIL) 25 MG capsule Take 25 mg by mouth at bedtime as needed (insomnia).    [provider]  metoprolol tartrate (LOPRESSOR) 25 MG tablet Take 0.5 tablets (12.5 mg total) by mouth 2 (two) times daily. Patient taking differently: Take 25 mg by mouth 2 (two) times daily. 06/09/19   Connye Burkitt, NP  OLANZapine (ZYPREXA) 7.5 MG tablet Take 7.5 mg by mouth at  bedtime.    [provider]  tetrahydrozoline-zinc (VISINE-AC) 0.05-0.25 % ophthalmic solution Place 2 drops into both eyes 3 (three) times daily as needed.    [provider]  thiamine (VITAMIN B-1) 50 MG tablet Take 50 mg by mouth daily.    [provider]    Allergies Hydrocodone, Benadryl [diphenhydramine hcl (sleep)], and Hydrocortisone  Family History  Problem Relation Age of Onset  . Hypertension Mother   . Hypertension Father   . Stroke Father   . Hypertension Sister   . Hypertension Brother   . Cancer Brother   . Cancer Brother        throat cancer  . Hypertension Brother   . Diabetes Maternal Grandmother   . Colon cancer Neg Hx     Social History Social History   Tobacco Use  . Smoking status: Former Smoker    Quit date: 08/16/2004    Years since quitting: 15.9  . Smokeless tobacco: Current User  . Tobacco comment: no tobacco, has quit long time ago  Vaping Use  . Vaping Use:  Never used  Substance Use Topics  . Alcohol use: Yes    Alcohol/week: 84.0 standard drinks    Types: 84 Cans of beer per week  . Drug use: No    Review of Systems Unable to assess ____________________________________________   PHYSICAL EXAM:  VITAL SIGNS: ED Triage Vitals  Enc Vitals Group     BP 08/03/20 1907 (!) 162/87     Pulse Rate 08/03/20 1907 (!) 101     Resp 08/03/20 1907 18     Temp 08/03/20 1907 98 F (36.7 C)     Temp Source 08/03/20 1907 Oral     SpO2 08/03/20 1907 100 %     Weight 08/03/20 1908 175 lb (79.4 kg)     Height 08/03/20 1908 6\' 2"  (1.88 m)     Head Circumference --      Peak Flow --      Pain Score 08/03/20 1907 8     Pain Loc --      Pain Edu? --      Excl. in [place]? --    Constitutional: Alert and oriented. Well appearing and in no acute distress. Eyes: Conjunctivae are normal. PERRL. Head: Atraumatic. Nose: No congestion/rhinnorhea. Mouth/Throat: Mucous membranes are moist. Neck: No stridor Cardiovascular: Grossly  normal heart sounds.  Good peripheral circulation. Respiratory: Normal respiratory effort.  No retractions. Gastrointestinal: Soft and nontender. No distention. Musculoskeletal: No obvious deformities Neurologic:  Normal speech and language. No gross focal neurologic deficits are appreciated. Skin:  Skin is warm and dry.  Ulceration of right foot with surrounding erythema Psychiatric: Mood and affect are normal. Speech and behavior are normal.  ____________________________________________   LABS (all labs ordered are listed, but only abnormal results are displayed)  Labs Reviewed  COMPREHENSIVE METABOLIC PANEL - Abnormal; Notable for the following components:      Result Value   Sodium 131 (*)    Potassium 3.2 (*)    Chloride 96 (*)    Glucose, Bld 130 (*)    Total Protein 8.3 (*)    Albumin 3.1 (*)    AST 50 (*)    Total Bilirubin 1.5 (*)    All other components within normal limits  SALICYLATE LEVEL - Abnormal; Notable for the following components:   Salicylate Lvl <0.8 (*)    All other components within normal limits  ACETAMINOPHEN LEVEL - Abnormal; Notable for the following components:   Acetaminophen (Tylenol), Serum <10 (*)    All other components within normal limits  CBC - Abnormal; Notable for the following components:   WBC 20.6 (*)    RBC 3.51 (*)    Hemoglobin 11.2 (*)    HCT 30.9 (*)    MCHC 36.2 (*)    Platelets 483 (*)    All other components within normal limits  ETHANOL  URINE DRUG SCREEN, QUALITATIVE ([facility] ONLY)   PROCEDURES  Procedure(s) performed (including Critical Care):  Procedures   ____________________________________________   INITIAL IMPRESSION / ASSESSMENT AND PLAN / ED COURSE  As part of my medical decision making, I reviewed the following data within the [facility] notes reviewed and incorporated, Labs reviewed, EKG interpreted, Old chart reviewed, Radiograph reviewed and Notes from prior ED visits reviewed  and incorporated        Patient presents under IVC for hallucinations/delusions. Thoughts are disorganized. No history of prior suicide attempt, and no SI or HI at this time. Clinically w/ no overt toxidrome, low suspicion for ingestion given hx  and exam Thoughts unlikely 2/2 anemia, hypothyroidism, infection, or ICH. Patients decision making capacity is compromised and they are unable to perform all ADLs (additionally they are without appropriate caretakers to assist through this deficit).  Consult: Psychiatry to evaluate patient for grave disability Disposition: Pending psychiatric evaluation  Care of this patient will be signed out the oncoming physician.  All pertinent patient formation is conveyed and all questions answered.  All further care and disposition decisions will be made by the oncoming physician.      ____________________________________________   FINAL CLINICAL IMPRESSION(S) / ED DIAGNOSES  Final diagnoses:  Confusion     ED Discharge Orders    None       Note:  This document was prepared using Dragon voice recognition software and may include unintentional dictation errors.   Naaman Plummer, MD 08/03/20 [phone]

## 2020-08-04 DIAGNOSIS — R410 Disorientation, unspecified: Secondary | ICD-10-CM

## 2020-08-04 LAB — URINE DRUG SCREEN, QUALITATIVE (ARMC ONLY)
Amphetamines, Ur Screen: NOT DETECTED
Barbiturates, Ur Screen: NOT DETECTED
Benzodiazepine, Ur Scrn: POSITIVE — AB
Cannabinoid 50 Ng, Ur ~~LOC~~: NOT DETECTED
Cocaine Metabolite,Ur ~~LOC~~: NOT DETECTED
MDMA (Ecstasy)Ur Screen: NOT DETECTED
Methadone Scn, Ur: NOT DETECTED
Opiate, Ur Screen: NOT DETECTED
Phencyclidine (PCP) Ur S: NOT DETECTED
Tricyclic, Ur Screen: NOT DETECTED

## 2020-08-04 LAB — RESP PANEL BY RT-PCR (FLU A&B, COVID) ARPGX2
Influenza A by PCR: NEGATIVE
Influenza B by PCR: NEGATIVE
SARS Coronavirus 2 by RT PCR: NEGATIVE

## 2020-08-04 MED ORDER — DOXYCYCLINE HYCLATE 100 MG PO TABS: 100.0000 mg | ORAL_TABLET | Freq: Two times a day (BID) | ORAL | 0 refills | Status: DC

## 2020-08-04 MED ORDER — ASPIRIN 81 MG PO TBEC: 81.0000 mg | DELAYED_RELEASE_TABLET | Freq: Every day | ORAL | 1 refills | Status: DC

## 2020-08-04 MED ORDER — OLANZAPINE 7.5 MG PO TABS: 7.5000 mg | ORAL_TABLET | Freq: Every day | ORAL | 1 refills | Status: DC

## 2020-08-04 MED ORDER — ATORVASTATIN CALCIUM 20 MG PO TABS: 20.0000 mg | ORAL_TABLET | Freq: Every day | ORAL | 1 refills | Status: AC

## 2020-08-04 MED ORDER — VITAMIN B-1 50 MG PO TABS: 50.0000 mg | ORAL_TABLET | Freq: Every day | ORAL | 1 refills | Status: AC

## 2020-08-04 MED ORDER — METOPROLOL TARTRATE 25 MG PO TABS: 25.0000 mg | ORAL_TABLET | Freq: Two times a day (BID) | ORAL | 1 refills | Status: DC

## 2020-08-04 NOTE — ED Provider Notes (Signed)
Patient seen and evaluated by Dr. Weber Cooks, who rescinded the IVC and recommends outpatient management.  I see no barriers to outpatient management medically.  We will provide him with a prescription for doxycycline to finish a course of due to his toe wound and leukocytosis.   Tristan Crofts, MD 08/04/20 [phone]

## 2020-08-04 NOTE — ED Notes (Signed)
IVC, pending consult

## 2020-08-04 NOTE — ED Notes (Signed)
Gave breakfast tray with juice.

## 2020-08-04 NOTE — ED Provider Notes (Signed)
Emergency Medicine Observation Re-evaluation Note  Tristan Cook is a 60 y.o. male, seen on rounds today.  Pt initially presented to the ED for complaints of Psychiatric Evaluation Currently, the patient is resting comfortably.  Physical Exam  BP (!) 162/87 (BP Location: Right Arm)   Pulse (!) 101   Temp 98 F (36.7 C) (Oral)   Resp 18   Ht 6\' 2"  (1.88 m)   Wt 79.4 kg   SpO2 100%   BMI 22.47 kg/m  Physical Exam Gen: No acute distress  Resp: Normal rise and fall of chest Neuro: Moving all four extremities Psych: Resting currently, calm and cooperative when awake    ED Course / MDM  EKG:   I have reviewed the labs performed to date as well as medications administered while in observation.  Recent changes in the last 24 hours include no acute events overnight.  Plan  Current plan is for psychiatry to reassess in the morning for disposition. Patient is under full IVC at this time.   [place] Silveria, Delice Bison, DO 08/04/20 0500

## 2020-08-04 NOTE — Consult Note (Signed)
Tristan Cook Psychiatry Consult   Reason for Consult: Psychiatric Evaluation Referring Physician:  Dr. 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 male patient presented to  [facility] ED via EMS voluntarily. The patient states he is here due to having bilateral lower extremity swelling. He says he is not homeless and does have a room at a hotel. He voiced he is currently on disability. The patient was last seen on 07/27/20 for a similar presentation.  Per the ED triage nurse note, Patient to ED with [facility] PD under IVC.  Per Officer with patient IVC paperwork taken out by another officer.  Patient was found wandering around a grocery store parking lot disoriented and confused, they were unable to locate any address for patient, patient refused to be seen by EMS.  Patient with noted edema to bilateral ankles that patient states has been that way for 3 days. The patient was seen face-to-face by this provider; the chart was reviewed and consulted with Dr. Leonides Schanz on 08/04/2020 due to the patient's care. It was discussed with both providers that the patient remained under observation overnight and will be reassessed in the a.m. to determine if he meets the criteria for psychiatric inpatient admission; he could be discharged home.  On evaluation, the patient is alert and oriented x4, calm and cooperative, and mood-congruent with affect. The patient does not appear to be responding to internal or external stimuli. Neither is the patient presenting with any delusional thinking. The patient  denies auditory or visual hallucinations. The patient denies any suicidal, homicidal, or self-harm ideations. The patient is not presenting with any psychotic or paranoid behaviors. During an encounter with the patient, he was able to answer questions appropriately.    HPI: Per Dr. Cheri [place], Tristan Cook is a 60 y.o. male with the below stated past medical history who presents under IVC by [facility] police after IVC paperwork was taken out due to patient wandering around a grocery store parking lot disoriented and confused.  They were told by patient that he had a psychiatric history and therefore was brought to the emergency department.  Patient states that he has had swelling as well as a wound to his foot for the last 3 days and states that it came from walking too much.  Patient currently denies any SI, HI, or AVH.  Patient continues to be confused and therefore further history and review of systems are unable to be obtained at this time  Past Psychiatric History:  Anxiety Depression Substance abuse ([place])  Risk to Self:   Risk to Others:   Prior Inpatient Therapy:   Prior Outpatient Therapy:    Past Medical History:  Past Medical History:  Diagnosis Date  . Abdominal pain   . Adenomatous colon polyp 2008  . Anxiety   . Arthritis   . Back pain   . Bilateral lumbar radiculopathy   . Blurred vision, bilateral   . Chronic back pain   . Constipation   . Depression   . GERD (gastroesophageal reflux disease)   . Gout   . Hemorrhoids   . High  cholesterol   . Hypertension   . Neck pain   . Neuropathy   . Neuropathy of both feet   . Numbness    rectal  . Numbness of legs   . Stroke ([place]) 2019  . Substance abuse ([place])    h/o excessive alcohol use; pt says he limits use to one to 2 beers daily he currently    Past Surgical History:  Procedure Laterality Date  . CATARACT EXTRACTION W/PHACO Right 08/16/2018   Procedure: CATARACT EXTRACTION PHACO AND INTRAOCULAR LENS PLACEMENT  RIGHT EYE;  Surgeon: Baruch Goldmann, MD;  Location: [facility] ORS;  Service: Ophthalmology;  Laterality: Right;  CDE: 4.18  . COLONOSCOPY  10/09/2006   3 mm pedunculated sigmoid colon polyp removed/8-mm sessile hepatic flexure polyp (tubular villous adenoma) removed/6-mm  descending colon polyp removed/small internal hemorrhoids  . COLONOSCOPY  02/28/2011   UJW:JXBJYN, multiple in the rectum/Internal hemorrhoids, MODERATE-CAUSING RECTAL BLEEDING  . COLONOSCOPY N/A 07/05/2016   Procedure: COLONOSCOPY;  Surgeon: [name], MD;  Location: [facility];  Service: Endoscopy;  Laterality: N/A;  11:15am  . 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 Mild erosive gastrtitis most likely due to indomethacin   . FINGER SURGERY Right    4th and 5th digit  . FLEXIBLE SIGMOIDOSCOPY  01/02/2012   SLF: 1. the colonic mucosa appeared normal in the sigmoid colon 2. Large internal hemorrhoids: cause for rectal bleeding/pain . s/p banding X 3   . HAND SURGERY    . REVERSE SHOULDER ARTHROPLASTY Left 05/29/2018   Procedure: REVERSE SHOULDER ARTHROPLASTY;  Surgeon: Hiram Gash, MD;  Location: [place];  Service: Orthopedics;  Laterality: Left;  . SAVORY DILATION N/A 07/21/2014   Procedure: SAVORY DILATION;  Surgeon: [name], MD;  Location: [facility];  Service: Endoscopy;  Laterality: N/A;   Family History:  Family History  Problem Relation Age of Onset  . Hypertension Mother   . Hypertension Father   . Stroke Father   . Hypertension Sister   . Hypertension Brother   . Cancer Brother   . Cancer Brother        throat cancer  . Hypertension Brother   . Diabetes Maternal Grandmother   . Colon cancer Neg Hx    Family Psychiatric  History:  Social History:  Social History   Substance and Sexual Activity  Alcohol Use Yes  . Alcohol/week: 84.0 standard drinks  . Types: 84 Cans of beer per week     Social History   Substance and Sexual  Activity  Drug Use No    Social History   Socioeconomic History  . Marital status: Single    Spouse name: Not on file  . Number of children: 0  . Years of education: 51  . Highest education level: Not on file  Occupational History  . Occupation: unemployed, Retail buyer: UNEMPLOYED  Tobacco Use  . Smoking status: Former Smoker    Quit date: 08/16/2004    Years since quitting: 15.9  . Smokeless tobacco: Current User  . Tobacco comment: no tobacco, has quit long time ago  Vaping Use  . Vaping Use: Never used  Substance and Sexual Activity  . Alcohol use: Yes    Alcohol/week: 84.0 standard drinks    Types: 84 Cans of beer per week  . Drug use: No  . Sexual activity: Not on file  Other Topics Concern  . Not on file  Social  History Narrative   Lives alone   No caffeine   Right handed   Social Determinants of Health   Financial Resource Strain: Not on file  Food Insecurity: Not on file  Transportation Needs: Not on file  Physical Activity: Not on file  Stress: Not on file  Social Connections: Not on file   Additional Social History:    Allergies:   Allergies  Allergen Reactions  . Hydrocodone     hallucinations from hydrocodone Other reaction(s): Hallucination hallucinations from hydrocodone  . Benadryl [Diphenhydramine Hcl (Sleep)] Other (See Comments)    Palpitations & SOB  . Hydrocortisone     Labs:  Results for orders placed or performed during the hospital encounter of 08/03/20 (from the past 48 hour(s))  Comprehensive metabolic panel     Status: Abnormal   Collection Time: 08/03/20  7:12 PM  Result Value Ref Range   Sodium 131 (L) 135 - 145 mmol/L   Potassium 3.2 (L) 3.5 - 5.1 mmol/L   Chloride 96 (L) 98 - 111 mmol/L   CO2 22 22 - 32 mmol/L   Glucose, Bld 130 (H) 70 - 99 mg/dL    Comment: Glucose reference range applies only to samples taken after fasting for at least 8 hours.   BUN 19 6 - 20 mg/dL   Creatinine, Ser 1.24 0.61 - 1.24  mg/dL   Calcium 9.2 8.9 - 10.3 mg/dL   Total Protein 8.3 (H) 6.5 - 8.1 g/dL   Albumin 3.1 (L) 3.5 - 5.0 g/dL   AST 50 (H) 15 - 41 U/L   ALT 28 0 - 44 U/L   Alkaline Phosphatase 50 38 - 126 U/L   Total Bilirubin 1.5 (H) 0.3 - 1.2 mg/dL   GFR, Estimated >60 >60 mL/min    Comment: (NOTE) Calculated using the CKD-EPI Creatinine Equation (2021)    Anion gap 13 5 - 15    Comment: Performed at [facility], [address] [place], [place] [address]  Ethanol     Status: None   Collection Time: 08/03/20  7:12 PM  Result Value Ref Range   Alcohol, Ethyl (B) <10 <10 mg/dL    Comment: (NOTE) Lowest detectable limit for serum alcohol is 10 mg/dL.  For medical purposes only. Performed at [facility], [address] [place], [place] [address]   Salicylate level     Status: Abnormal   Collection Time: 08/03/20  7:12 PM  Result Value Ref Range   Salicylate Lvl <4.4 (L) 7.0 - 30.0 mg/dL    Comment: Performed at [facility], [place], [place] [address]  Acetaminophen level     Status: Abnormal   Collection Time: 08/03/20  7:12 PM  Result Value Ref Range   Acetaminophen (Tylenol), Serum <10 (L) 10 - 30 ug/mL    Comment: (NOTE) Therapeutic concentrations vary significantly. A range of 10-30 ug/mL  may be an effective concentration for many patients. However, some  are best treated at concentrations outside of this range. Acetaminophen concentrations >150 ug/mL at 4 hours after ingestion  and >50 ug/mL at 12 hours after ingestion are often associated with  toxic reactions.  Performed at [facility], [place], [place] [address]   cbc     Status: Abnormal   Collection Time: 08/03/20  7:12 PM  Result Value Ref Range   WBC 20.6 (H) 4.0 - 10.5 K/uL   RBC 3.51 (L) 4.22 - 5.81 MIL/uL   Hemoglobin 11.2 (  L) 13.0 - 17.0 g/dL   HCT 30.9 (L) 39.0 - 52.0 %   MCV 88.0 80.0 - 100.0 fL   MCH 31.9 26.0 - 34.0 pg   MCHC 36.2 (H)  30.0 - 36.0 g/dL   RDW 13.2 11.5 - 15.5 %   Platelets 483 (H) 150 - 400 K/uL   nRBC 0.0 0.0 - 0.2 %    Comment: Performed at [facility], [address], [place], [place] [address]    Current Facility-Administered Medications  Medication Dose Route Frequency Provider Last Rate Last Admin  . aspirin EC tablet 81 mg  81 mg Oral Daily Ward, Kristen N, DO      . atorvastatin (LIPITOR) tablet 20 mg  20 mg Oral Daily Ward, Kristen N, DO      . doxycycline (VIBRA-TABS) tablet 100 mg  100 mg Oral Q12H Naaman Plummer, MD   100 mg at 08/03/20 2354  . hydrOXYzine (ATARAX/VISTARIL) tablet 25 mg  25 mg Oral QHS PRN Ward, Kristen N, DO      . metoprolol tartrate (LOPRESSOR) tablet 25 mg  25 mg Oral BID Naaman Plummer, MD      . OLANZapine (ZYPREXA) tablet 7.5 mg  7.5 mg Oral QHS Naaman Plummer, MD      . thiamine tablet 50 mg  50 mg Oral Daily Ward, Kristen N, DO       Current Outpatient Medications  Medication Sig Dispense Refill  . aspirin EC 81 MG tablet Take 1 tablet (81 mg total) by mouth daily. 30 tablet 1  . atorvastatin (LIPITOR) 20 MG tablet Take 20 mg by mouth daily.     [name] gabapentin (NEURONTIN) 800 MG tablet Take 800 mg by mouth 3 (three) times daily.  (Patient not taking: Reported on 07/18/2020)    . hydrOXYzine (VISTARIL) 25 MG capsule Take 25 mg by mouth at bedtime as needed (insomnia).    . metoprolol tartrate (LOPRESSOR) 25 MG tablet Take 0.5 tablets (12.5 mg total) by mouth 2 (two) times daily. (Patient taking differently: Take 25 mg by mouth 2 (two) times daily.) 30 tablet 0  . OLANZapine (ZYPREXA) 7.5 MG tablet Take 7.5 mg by mouth at bedtime.    [name] tetrahydrozoline-zinc (VISINE-AC) 0.05-0.25 % ophthalmic solution Place 2 drops into both eyes 3 (three) times daily as needed.    . thiamine (VITAMIN B-1) 50 MG tablet Take 50 mg by mouth daily.      Musculoskeletal: Strength & Muscle Tone: within normal limits Gait & Station: normal Patient leans: N/A  Psychiatric  Specialty Exam:  Presentation  General Appearance: Appropriate for Environment  Eye Contact:Fair  Speech:Clear and Coherent; Normal Rate  Speech Volume:Normal  Handedness:Right   Mood and Affect  Mood:Depressed  Affect:Congruent; Depressed   Thought Process  Thought Processes:Coherent; Linear  Descriptions of Associations:Intact  Orientation:Full (Time, Place and Person)  Thought Content:Logical  History of Schizophrenia/Schizoaffective disorder:No  Duration of Psychotic Symptoms:No data recorded Hallucinations:Hallucinations: None  Ideas of Reference:None  Suicidal Thoughts:Suicidal Thoughts: No  Homicidal Thoughts:Homicidal Thoughts: No   Sensorium  Memory:Immediate Good; Recent Good; Remote Good  Judgment:Fair  Insight:Fair   Executive Functions  Concentration:Good  Attention Span:Good  [place] of Knowledge:Good  Language:Good   Psychomotor Activity  Psychomotor Activity:Psychomotor Activity: Normal   Assets  Assets:Communication Skills; Desire for Improvement; Physical Health; Resilience   Sleep  Sleep:Sleep: Good   Physical Exam: Physical Exam Vitals and nursing note reviewed.  Constitutional:      Appearance:  Normal appearance. He is normal weight.  HENT:     Nose: Nose normal.  Cardiovascular:     Rate and Rhythm: Tachycardia present.     Pulses: Normal pulses.  Pulmonary:     Effort: Pulmonary effort is normal.  Musculoskeletal:        General: Normal range of motion.     Cervical back: Normal range of motion and neck supple.  Neurological:     General: No focal deficit present.     Mental Status: He is alert and oriented to person, place, and time.  Psychiatric:        Attention and Perception: Attention and perception normal.        Mood and Affect: Mood is depressed.        Behavior: Behavior normal. Behavior is cooperative.        Thought Content: Thought content normal.        Cognition and Memory:  Cognition and memory normal.        Judgment: Judgment normal.    ROS Blood pressure (!) 162/87, pulse (!) 101, temperature 98 F (36.7 C), temperature source Oral, resp. rate 18, height 6\' 2"  (1.88 m), weight 79.4 kg, SpO2 100 %. Body mass index is 22.47 kg/m.  Treatment Plan Summary: Daily contact with patient to assess and evaluate symptoms and progress in treatment and Plan The patient remained under observation overnight and will be reassessed in the a.m. to determine if he meets the criteria for psychiatric inpatient admission; he could be discharged home.  Disposition: Supportive therapy provided about ongoing stressors. The patient remained under observation overnight and will be reassessed in the a.m. to determine if he meets the criteria for psychiatric inpatient admission; he could be discharged home. Caroline Sauger, NP 08/04/2020 4:27 AM

## 2020-08-04 NOTE — BH Assessment (Signed)
Comprehensive Clinical Assessment (CCA) Note  08/04/2020 Tristan Cook [id number]  Chief Complaint: Patient is a 60 year old male presenting to [facility] ED under IVC. Per triage note Patient to ED with [facility] under IVC.  Per Officer with patient IVC paperwork taken out by another officer.  Patient was found wandering around a grocery store parking lot disoriented and confused, they were unable to locate any address for patient, patient refused to be seen by EMS.  Patient with noted edema to bilateral ankles that patient states has been that way for 3 days.During assessment patient appeared alert and oriented x4, calm and cooperative. Patient reports why he is presenting to the ED "something stupid, they saw my feet and made me stay." Patient reports that he is not currently homeless and is living in a hotel room. Patient does have a history of alcohol abuse but no current alcohol is showing on his BAL. Patient reports his sleep is good and his appetite is good. Patient denies SI/HI/AH/VH and does not appear to be responding to any internal or external stimuli.  Per Psyc NP Ysidro Evert patient to be reassessed  Chief Complaint  Patient presents with  . Psychiatric Evaluation   Visit Diagnosis: Alcohol Abuse, Alcohol-induced psychosis with hallucinations   CCA Screening, Triage and Referral (STR)  Patient Reported Information How did you hear about [place]? Legal System  Referral name: Law Enforcement  Referral phone number: No data recorded  Whom do you see for routine medical problems? I don't have a doctor  Practice/Facility Name: No data recorded Practice/Facility Phone Number: No data recorded Name of Contact: No data recorded Contact Number: No data recorded Contact Fax Number: No data recorded Prescriber Name: No data recorded Prescriber Address (if known): No data recorded  What Is the Reason for Your Visit/Call Today? Patient brought to the ER after wondering  around  How Long Has This Been Causing You Problems? > than 6 months  What Do You Feel Would Help You the Most Today? Alcohol or Drug Use Treatment   Have You Recently Been in Any Inpatient Treatment (Hospital/Detox/Crisis Center/28-Day Program)? No  Name/Location of Program/Hospital:[facility] in 06/05/19  How Long Were You There? See Epic chart  When Were You Discharged? 06/09/2019   Have You Ever Received Services From [employer] Before? No  Who Do You See at [facility]? [place]   Have You Recently Had Any Thoughts About Hurting Yourself? No  Are You Planning to Commit Suicide/Harm Yourself At This time? No   Have you Recently Had Thoughts About [place]? No  Explanation: No data recorded  Have You Used Any Alcohol or Drugs in the Past 24 Hours? Yes  How Long Ago Did You Use Drugs or Alcohol? 1700  What Did You Use and How Much? Alcohol   Do You Currently Have a Therapist/Psychiatrist? No  Name of Therapist/Psychiatrist: No data recorded  Have You Been Recently Discharged From Any Office Practice or Programs? No  Explanation of Discharge From Practice/Program: No data recorded    CCA Screening Triage Referral Assessment Type of Contact: Face-to-Face  Is this Initial or Reassessment? No data recorded Date Telepsych consult ordered in [facility]:  10/03/2019  Time Telepsych consult ordered in [facility]:  2254   Patient Reported Information Reviewed? Yes  Patient Left Without Being Seen? No data recorded Reason for Not Completing Assessment: No data recorded  Collateral Involvement: n/a   Does Patient Have a Court Appointed Legal Guardian? No data recorded Name  and Contact of Legal Guardian: Self  If Minor and Not Living with Parent(s), Who has Custody? n/a  Is CPS involved or ever been involved? Never  Is APS involved or ever been involved? Never   Patient Determined To Be At Risk for Harm To Self or Others Based on Review of Patient Reported Information  or Presenting Complaint? No  Method: No data recorded Availability of Means: No data recorded Intent: No data recorded Notification Required: No data recorded Additional Information for Danger to Others Potential: No data recorded Additional Comments for Danger to Others Potential: No data recorded Are There Guns or Other Weapons in Your Home? No data recorded Types of Guns/Weapons: No data recorded Are These Weapons Safely Secured?                            No data recorded Who Could Verify You Are Able To Have These Secured: No data recorded Do You Have any Outstanding Charges, Pending Court Dates, Parole/Probation? No data recorded Contacted To Inform of Risk of Harm To Self or Others: No data recorded  Location of Assessment: [facility] ED   Does Patient Present under Involuntary Commitment? Yes  IVC Papers Initial File Date: 08/04/2020   [place] of Residence: [place]   Patient Currently Receiving the Following Services: Not Receiving Services   Determination of Need: Emergent (2 hours)   Options For Referral: Outpatient Therapy     CCA Biopsychosocial Intake/Chief Complaint:  Patient presents under IVC due to wondering around disoriented  Current Symptoms/Problems: Patient presents under IVC due to wondering around disoriented   Patient Reported Schizophrenia/Schizoaffective Diagnosis in Past: No   Strengths: Patient is able to communicate  Preferences: Unknown  Abilities: Patient is able to communicate   Type of Services Patient Feels are Needed: Unknown   Initial Clinical Notes/Concerns: None   Mental Health Symptoms Depression:  Change in energy/activity   Duration of Depressive symptoms: Greater than two weeks   Mania:  None   Anxiety:   None   Psychosis:  None   Duration of Psychotic symptoms: No data recorded  Trauma:  None   Obsessions:  None   Compulsions:  None   Inattention:  None   Hyperactivity/Impulsivity:  N/A    Oppositional/Defiant Behaviors:  None   Emotional Irregularity:  None   Other Mood/Personality Symptoms:  No data recorded   Mental Status Exam Appearance and self-care  Stature:  Average   Weight:  Average weight   Clothing:  Disheveled   Grooming:  Neglected   Cosmetic use:  None   Posture/gait:  Normal   Motor activity:  Not Remarkable   Sensorium  Attention:  Normal   Concentration:  Normal   Orientation:  X5   Recall/memory:  Normal   Affect and Mood  Affect:  Appropriate   Mood:  Depressed   Relating  Eye contact:  Avoided   Facial expression:  Depressed   Attitude toward examiner:  Cooperative   Thought and Language  Speech flow: Clear and Coherent   Thought content:  Appropriate to Mood and Circumstances   Preoccupation:  None   Hallucinations:  None   Organization:  No data recorded  [employer] of Knowledge:  Fair   Intelligence:  Average   Abstraction:  Normal   Judgement:  Fair   Art therapist:  Realistic   Insight:  Lacking   Decision Making:  Impulsive   Social Functioning  Social Maturity:  Isolates   Social Judgement:  Heedless   Stress  Stressors:  Other (Comment)   Coping Ability:  Exhausted   Skill Deficits:  None   Supports:  Support needed     Religion: Religion/Spirituality Are You A Religious Person?: No  Leisure/Recreation: Leisure / Recreation Do You Have Hobbies?: No  Exercise/Diet: Exercise/Diet Do You Exercise?: No Have You Gained or Lost A Significant Amount of Weight in the Past Six Months?: No Do You Follow a Special Diet?: No Do You Have Any Trouble Sleeping?: No   CCA Employment/Education Employment/Work Situation: Employment / Work Situation Employment situation: On disability Why is patient on disability: Pt reports "my wrist, knees, ankles". How long has patient been on disability: Pt reports "Oct 2019" Patient's job has been impacted by current illness:  No What is the longest time patient has a held a job?: 5 years Where was the patient employed at that time?: [employer] Has patient ever been in the [employer]?: No  Education: Education Is Patient Currently Attending School?: No Did You Have An Individualized Education Program (IIEP): No Did You Have Any Difficulty At [employer]?: No Patient's Education Has Been Impacted by Current Illness: No   CCA Family/Childhood History Family and Relationship History: Family history Marital status: Single What is your sexual orientation?: Unknown Has your sexual activity been affected by drugs, alcohol, medication, or emotional stress?: Unknown  Childhood History:  Childhood History Additional childhood history information: None reported Description of patient's relationship with caregiver when they were a child: None reported Patient's description of current relationship with people who raised him/her: None reported How were you disciplined when you got in trouble as a child/adolescent?: None reported Does patient have siblings?: No Did patient suffer any verbal/emotional/physical/sexual abuse as a child?: No Did patient suffer from severe childhood neglect?: No Has patient ever been sexually abused/assaulted/raped as an adolescent or adult?: No Was the patient ever a victim of a crime or a disaster?: No Witnessed domestic violence?: No Has patient been affected by domestic violence as an adult?: No  Child/Adolescent Assessment:     CCA Substance Use Alcohol/Drug Use: Alcohol / Drug Use Pain Medications: See PTA Prescriptions: See PTA Over the Counter: See PTA History of alcohol / drug use?: Yes Longest period of sobriety (when/how long): Unable quantify Negative Consequences of Use: Personal relationships Withdrawal Symptoms:  (Denied having withdrawal symptoms) Substance #1 Name of Substance 1: Alcohol 1 - Amount (size/oz): 12 pack of beer 1 - Frequency: Daily 1 - Last Use /  Amount: 07/18/20                       ASAM's:  Six Dimensions of Multidimensional Assessment  Dimension 1:  Acute Intoxication and/or Withdrawal Potential:      Dimension 2:  Biomedical Conditions and Complications:      Dimension 3:  Emotional, Behavioral, or Cognitive Conditions and Complications:     Dimension 4:  Readiness to Change:     Dimension 5:  Relapse, Continued use, or Continued Problem Potential:     Dimension 6:  Recovery/Living Environment:     ASAM Severity Score:    ASAM Recommended Level of Treatment:     Substance use Disorder (SUD) Substance Use Disorder (SUD)  Checklist Symptoms of Substance Use: Continued use despite having a persistent/recurrent physical/psychological problem caused/exacerbated by use,Substance(s) often taken in larger amounts or over longer times than was intended,Continued use despite persistent or recurrent social, interpersonal problems,  caused or exacerbated by use,Evidence of tolerance,Presence of craving or strong urge to use,Social, occupational, recreational activities given up or reduced due to use,Large amounts of time spent to obtain, use or recover from the substance(s),Recurrent use that results in a failure to fulfill major role obligations (work, school, home),Repeated use in physically hazardous situations,Persistent desire or unsuccessful efforts to cut down or control use  Recommendations for Services/Supports/Treatments:   Per Psyc NP Ysidro Evert patient to be reassessed   DSM5 Diagnoses: Patient Active Problem List   Diagnosis Date Noted  . Homelessness 10/04/2019  . 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 Acute psychosis ([place]) 06/05/2019  . Delusional disorder ([place]) 05/24/2019  . Swelling of limb 03/11/2019  . Peripheral polyneuropathy 02/12/2019  . Alcohol intoxication ([place]) 08/27/2018  . Hallucinations 08/27/2018  . 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 Alcohol use disorder, severe, dependence ([place]) 08/27/2018  . TIA (transient ischemic attack) 05/29/2018  . Syncope 05/29/2018  . Alcohol abuse 05/29/2018  . Fall 05/29/2018  . Dislocation of left shoulder joint 05/29/2018  . Closed fracture of left proximal humerus 05/29/2018  . Abnormal LFTs 05/29/2018  . Hypokalemia 05/29/2018  . Constipation 10/17/2017  . Hoarseness 10/17/2017  . Weakness 04/11/2017  . Weakness of both legs 04/10/2017  . Vitamin B deficiency 01/22/2017  . 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 Stroke, small vessel ([place]) 01/17/2017  . 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 Vitamin B12 deficiency neuropathy ([place]) 01/17/2017  . Adjustment disorder with depressed mood 08/15/2016  . History of colonic polyps 06/14/2016  . Elevated blood pressure reading 06/14/2016  . Hemorrhoids 09/20/2015  . Depressive disorder 08/03/2015  . Back pain with radiation 06/15/2015  . Insomnia 06/03/2015  . Hyperlipidemia LDL goal <130 04/25/2015  . 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 Fatigue 03/29/2015  . Allergic rhinitis 02/10/2015  . Esophageal stricture 10/22/2014  . Overweight (BMI 25.0-29.9) 10/06/2013  . GERD (gastroesophageal reflux disease) 04/23/2012  . Hemorrhoids, internal 11/02/2011  . Essential hypertension 07/25/2007  . Neck pain on left side 07/25/2007    Patient Centered Plan: Patient is on the following Treatment Plan(s):  Substance Abuse   Referrals to Alternative Service(s): Referred to Alternative Service(s):   Place:   Date:   Time:    Referred to Alternative Service(s):   Place:   Date:   Time:    Referred to Alternative Service(s):   Place:   Date:   Time:    Referred to Alternative Service(s):   Place:   Date:   Time:     Tristan Cook, LCAS-A

## 2020-08-04 NOTE — Consult Note (Signed)
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 male patient admitted with "I am doing well thank you".  HPI: Patient seen chart reviewed.  Patient known from recent previous encounter.  60 year old man with a past history of alcohol abuse problems was found by police wandering around in the parking lot of [facility] appearing to be confused.  He would not cooperate with their suggestions of treatment so they followed up by filing IVC.  On interview today the patient says he is feeling fine.  He is alert and oriented to place time and situation.  He says that a friend of his "played a trick on me" by dropping him off in the parking lot and ditching him.  This is somewhat similar to the story last time.  Patient claims that he is living in a motel and still has his belongings there.  He says he still drinks a little bit but minimizes it.  Denies recent use of other drugs.  Patient denies any suicidal or homicidal thoughts at all.  Says his mood feels fine.  Does not show any evidence of psychosis or aggression.  He does note that his edema in his ankles is pretty bad.  Past Psychiatric History: Past history of alcohol abuse.  Has had a couple of admissions to the ER recently for behavior that suggests that he may be getting more confused and delirious possibly from alcohol possibly from some other factor.  It has been clearing up  quickly once he rests in the ER.  No history of suicide attempts.  Risk to Self:   Risk to Others:   Prior Inpatient Therapy:   Prior Outpatient Therapy:    Past Medical History:  Past Medical History:  Diagnosis Date  . Abdominal pain   . Adenomatous colon polyp 2008  . Anxiety   . Arthritis   . Back pain   . Bilateral lumbar radiculopathy   . Blurred vision, bilateral   . Chronic back pain   . Constipation   . Depression   . GERD (gastroesophageal reflux disease)   . Gout   . Hemorrhoids   . High cholesterol   . Hypertension   . Neck pain   . Neuropathy   . Neuropathy of both feet   . Numbness    rectal  . Numbness of legs   . Stroke ([place]) 2019  . Substance abuse ([place])    h/o excessive alcohol use; pt says he limits use to one to 2 beers daily he currently    Past Surgical History:  Procedure Laterality Date  . CATARACT EXTRACTION W/PHACO Right 08/16/2018   Procedure: CATARACT EXTRACTION PHACO AND INTRAOCULAR LENS PLACEMENT RIGHT EYE;  Surgeon: Baruch Goldmann, MD;  Location: [facility];  Service: Ophthalmology;  Laterality: Right;  CDE: 4.18  . COLONOSCOPY  10/09/2006   3 mm pedunculated sigmoid colon polyp removed/8-mm sessile hepatic flexure polyp (tubular villous adenoma) removed/6-mm  descending colon polyp  removed/small internal hemorrhoids  . COLONOSCOPY  02/28/2011   KPT:WSFKCL, multiple in the rectum/Internal hemorrhoids, MODERATE-CAUSING RECTAL BLEEDING  . COLONOSCOPY N/A 07/05/2016   Procedure: COLONOSCOPY;  Surgeon: [name], MD;  Location: [facility];  Service: Endoscopy;  Laterality: N/A;  11:15am  . 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 Mild erosive gastrtitis most likely due to indomethacin   . FINGER SURGERY Right    4th and 5th digit  . FLEXIBLE SIGMOIDOSCOPY  01/02/2012   SLF: 1. the colonic mucosa appeared normal in the sigmoid colon 2. Large internal hemorrhoids: cause for rectal  bleeding/pain . s/p banding X 3   . HAND SURGERY    . REVERSE SHOULDER ARTHROPLASTY Left 05/29/2018   Procedure: REVERSE SHOULDER ARTHROPLASTY;  Surgeon: Hiram Gash, MD;  Location: [facility];  Service: Orthopedics;  Laterality: Left;  . SAVORY DILATION N/A 07/21/2014   Procedure: SAVORY DILATION;  Surgeon: [name], MD;  Location: [facility];  Service: Endoscopy;  Laterality: N/A;   Family History:  Family History  Problem Relation Age of Onset  . Hypertension Mother   . Hypertension Father   . Stroke Father   . Hypertension Sister   . Hypertension Brother   . Cancer Brother   . Cancer Brother        throat cancer  . Hypertension Brother   . Diabetes Maternal Grandmother   . Colon cancer Neg Hx    Family Psychiatric  History: None reported Social History:  Social History   Substance and Sexual Activity  Alcohol Use Yes  . Alcohol/week: 84.0 standard drinks  . Types: 84 Cans of beer per week     Social History   Substance and Sexual Activity  Drug Use No    Social History   Socioeconomic History  . Marital status: Single    Spouse name: Not on file  . Number of children: 0  . Years of education: 47  . Highest education level: Not on file  Occupational History  . Occupation: unemployed, Retail buyer: UNEMPLOYED  Tobacco Use  . Smoking status: Former Smoker    Quit date: 08/16/2004    Years since quitting: 15.9  . Smokeless tobacco: Current User  . Tobacco comment: no tobacco, has quit long time ago  Vaping Use  . Vaping Use: Never used  Substance and Sexual Activity  . Alcohol use: Yes    Alcohol/week: 84.0 standard drinks    Types: 84 Cans of beer per week  . Drug use: No  . Sexual activity: Not on file  Other Topics Concern  . Not on file  Social History Narrative   Lives alone   No caffeine   Right handed   Social Determinants of Health   Financial Resource Strain: Not on file  Food Insecurity: Not on file  Transportation  Needs: Not on file  Physical Activity: Not on file  Stress: Not on file  Social Connections: Not on file   Additional Social History:    Allergies:   Allergies  Allergen Reactions  . Hydrocodone     hallucinations from hydrocodone Other reaction(s): Hallucination hallucinations from hydrocodone  . Benadryl [Diphenhydramine Hcl (Sleep)] Other (See Comments)    Palpitations & SOB  . Hydrocortisone     Labs:  Results for orders placed or performed during the hospital encounter of 08/03/20 (from the past 48 hour(s))  Urine Drug Screen, Qualitative  Status: Abnormal   Collection Time: 08/03/20  6:47 AM  Result Value Ref Range   Tricyclic, Ur Screen NONE DETECTED NONE DETECTED   Amphetamines, Ur Screen NONE DETECTED NONE DETECTED   MDMA (Ecstasy)Ur Screen NONE DETECTED NONE DETECTED   Cocaine Metabolite,Ur [place] NONE DETECTED NONE DETECTED   Opiate, Ur Screen NONE DETECTED NONE DETECTED   Phencyclidine (PCP) Ur S NONE DETECTED NONE DETECTED   Cannabinoid 50 Ng, Ur [place] NONE DETECTED NONE DETECTED   Barbiturates, Ur Screen NONE DETECTED NONE DETECTED   Benzodiazepine, Ur Scrn POSITIVE (A) NONE DETECTED   Methadone Scn, Ur NONE DETECTED NONE DETECTED    Comment: (NOTE) Tricyclics + metabolites, urine    Cutoff 1000 ng/mL Amphetamines + metabolites, urine  Cutoff 1000 ng/mL MDMA (Ecstasy), urine              Cutoff 500 ng/mL Cocaine Metabolite, urine          Cutoff 300 ng/mL Opiate + metabolites, urine        Cutoff 300 ng/mL Phencyclidine (PCP), urine         Cutoff 25 ng/mL Cannabinoid, urine                 Cutoff 50 ng/mL Barbiturates + metabolites, urine  Cutoff 200 ng/mL Benzodiazepine, urine              Cutoff 200 ng/mL Methadone, urine                   Cutoff 300 ng/mL  The urine drug screen provides only a preliminary, unconfirmed analytical test result and should not be used for non-medical purposes. Clinical consideration and professional judgment should be  applied to any positive drug screen result due to possible interfering substances. A more specific alternate chemical method must be used in order to obtain a confirmed analytical result. Gas chromatography / mass spectrometry (GC/MS) is the preferred confirm atory method. Performed at [facility], [address] [place], [place] [address]   Comprehensive metabolic panel     Status: Abnormal   Collection Time: 08/03/20  7:12 PM  Result Value Ref Range   Sodium 131 (L) 135 - 145 mmol/L   Potassium 3.2 (L) 3.5 - 5.1 mmol/L   Chloride 96 (L) 98 - 111 mmol/L   CO2 22 22 - 32 mmol/L   Glucose, Bld 130 (H) 70 - 99 mg/dL    Comment: Glucose reference range applies only to samples taken after fasting for at least 8 hours.   BUN 19 6 - 20 mg/dL   Creatinine, Ser 1.24 0.61 - 1.24 mg/dL   Calcium 9.2 8.9 - 10.3 mg/dL   Total Protein 8.3 (H) 6.5 - 8.1 g/dL   Albumin 3.1 (L) 3.5 - 5.0 g/dL   AST 50 (H) 15 - 41 U/L   ALT 28 0 - 44 U/L   Alkaline Phosphatase 50 38 - 126 U/L   Total Bilirubin 1.5 (H) 0.3 - 1.2 mg/dL   GFR, Estimated >60 >60 mL/min    Comment: (NOTE) Calculated using the CKD-EPI Creatinine Equation (2021)    Anion gap 13 5 - 15    Comment: Performed at [facility], [address] [place], [place] [address]  Ethanol     Status: None   Collection Time: 08/03/20  7:12 PM  Result Value Ref Range   Alcohol, Ethyl (B) <10 <10 mg/dL    Comment: (NOTE) Lowest detectable limit for serum alcohol is 10 mg/dL.  For medical purposes only. Performed at [facility], [address] [place], [place] [address]   Salicylate level     Status: Abnormal   Collection Time: 08/03/20  7:12 PM  Result Value Ref Range   Salicylate Lvl <3.0 (L) 7.0 - 30.0 mg/dL    Comment: Performed at [facility], [place], [place] [address]  Acetaminophen level     Status: Abnormal   Collection Time: 08/03/20  7:12 PM  Result Value Ref Range    Acetaminophen (Tylenol), Serum <10 (L) 10 - 30 ug/mL    Comment: (NOTE) Therapeutic concentrations vary significantly. A range of 10-30 ug/mL  may be an effective concentration for many patients. However, some  are best treated at concentrations outside of this range. Acetaminophen concentrations >150 ug/mL at 4 hours after ingestion  and >50 ug/mL at 12 hours after ingestion are often associated with  toxic reactions.  Performed at [facility], [address] [place], [place] [address]   cbc     Status: Abnormal   Collection Time: 08/03/20  7:12 PM  Result Value Ref Range   WBC 20.6 (H) 4.0 - 10.5 K/uL   RBC 3.51 (L) 4.22 - 5.81 MIL/uL   Hemoglobin 11.2 (L) 13.0 - 17.0 g/dL   HCT 30.9 (L) 39.0 - 52.0 %   MCV 88.0 80.0 - 100.0 fL   MCH 31.9 26.0 - 34.0 pg   MCHC 36.2 (H) 30.0 - 36.0 g/dL   RDW 13.2 11.5 - 15.5 %   Platelets 483 (H) 150 - 400 K/uL   nRBC 0.0 0.0 - 0.2 %    Comment: Performed at [facility], [address], [place], [place] [address]    Current Facility-Administered Medications  Medication Dose Route Frequency Provider Last Rate Last Admin  . aspirin EC tablet 81 mg  81 mg Oral Daily Ward, Kristen N, DO   81 mg at 08/04/20 1019  . atorvastatin (LIPITOR) tablet 20 mg  20 mg Oral Daily Ward, Kristen N, DO   20 mg at 08/04/20 1020  . doxycycline (VIBRA-TABS) tablet 100 mg  100 mg Oral Q12H Naaman Plummer, MD   100 mg at 08/04/20 1020  . hydrOXYzine (ATARAX/VISTARIL) tablet 25 mg  25 mg Oral QHS PRN Ward, Kristen N, DO      . metoprolol tartrate (LOPRESSOR) tablet 25 mg  25 mg Oral BID Naaman Plummer, MD   25 mg at 08/04/20 1020  . OLANZapine (ZYPREXA) tablet 7.5 mg  7.5 mg Oral QHS Naaman Plummer, MD      . thiamine tablet 50 mg  50 mg Oral Daily Ward, Kristen N, DO   50 mg at 08/04/20 1020   Current Outpatient Medications  Medication Sig Dispense Refill  . aspirin EC 81 MG tablet Take 1 tablet (81 mg total) by mouth daily. 30 tablet 1  .  atorvastatin (LIPITOR) 20 MG tablet Take 20 mg by mouth daily.     [name] gabapentin (NEURONTIN) 800 MG tablet Take 800 mg by mouth 3 (three) times daily.  (Patient not taking: Reported on 07/18/2020)    . hydrOXYzine (VISTARIL) 25 MG capsule Take 25 mg by mouth at bedtime as needed (insomnia).    . metoprolol tartrate (LOPRESSOR) 25 MG tablet Take 0.5 tablets (12.5 mg total) by mouth 2 (two) times daily. (Patient taking differently: Take 25 mg by mouth 2 (two) times daily.) 30 tablet 0  . OLANZapine (ZYPREXA) 7.5 MG tablet  Take 7.5 mg by mouth at bedtime.    [name] tetrahydrozoline-zinc (VISINE-AC) 0.05-0.25 % ophthalmic solution Place 2 drops into both eyes 3 (three) times daily as needed.    . thiamine (VITAMIN B-1) 50 MG tablet Take 50 mg by mouth daily.      Musculoskeletal: Strength & Muscle Tone: within normal limits Gait & Station: normal Patient leans: N/A            Psychiatric Specialty Exam:  Presentation  General Appearance: Appropriate for Environment  Eye Contact:Fair  Speech:Clear and Coherent; Normal Rate  Speech Volume:Normal  Handedness:Right   Mood and Affect  Mood:Depressed  Affect:Congruent; Depressed   Thought Process  Thought Processes:Coherent; Linear  Descriptions of Associations:Intact  Orientation:Full (Time, Place and Person)  Thought Content:Logical  History of Schizophrenia/Schizoaffective disorder:No  Duration of Psychotic Symptoms:No data recorded Hallucinations:Hallucinations: None  Ideas of Reference:None  Suicidal Thoughts:Suicidal Thoughts: No  Homicidal Thoughts:Homicidal Thoughts: No   Sensorium  Memory:Immediate Good; Recent Good; Remote Good  Judgment:Fair  Insight:Fair   Executive Functions  Concentration:Good  Attention Span:Good  [place] of Knowledge:Good  Language:Good   Psychomotor Activity  Psychomotor Activity:Psychomotor Activity: Normal   Assets  Assets:Communication Skills;  Desire for Improvement; Physical Health; Resilience   Sleep  Sleep:Sleep: Good   Physical Exam: Physical Exam Vitals and nursing note reviewed.  Constitutional:      Appearance: Normal appearance.  HENT:     Head: Normocephalic and atraumatic.     Mouth/Throat:     Pharynx: Oropharynx is clear.  Eyes:     Pupils: Pupils are equal, round, and reactive to light.  Cardiovascular:     Rate and Rhythm: Normal rate and regular rhythm.  Pulmonary:     Effort: Pulmonary effort is normal.     Breath sounds: Normal breath sounds.  Abdominal:     General: Abdomen is flat.     Palpations: Abdomen is soft.  Musculoskeletal:        General: Normal range of motion.  Skin:    General: Skin is warm and dry.  Neurological:     General: No focal deficit present.     Mental Status: He is alert. Mental status is at baseline.  Psychiatric:        Attention and Perception: Attention normal.        Mood and Affect: Mood normal. Affect is blunt.        Speech: Speech normal.        Behavior: Behavior is cooperative.        Thought Content: Thought content normal.        Cognition and Memory: Memory is impaired.    Review of Systems  Constitutional: Negative.   HENT: Negative.   Eyes: Negative.   Respiratory: Negative.   Cardiovascular: Positive for leg swelling.  Gastrointestinal: Negative.   Musculoskeletal: Negative.   Skin: Negative.   Neurological: Negative.   Psychiatric/Behavioral: Positive for substance abuse. Negative for depression, hallucinations, memory loss and suicidal ideas. The patient has insomnia. The patient is not nervous/anxious.    Blood pressure 135/66, pulse 97, temperature 97.8 F (36.6 C), temperature source Oral, resp. rate 16, height 6\' 2"  (1.88 m), weight 79.4 kg, SpO2 100 %. Body mass index is 22.47 kg/m.  Treatment Plan Summary: Plan This 60 year old man does not meet commitment criteria.  He is not psychotic not depressed not manic.  May have some  degree of memory impairment but the delirium is resolved at this point.  Able  to articulate a reasonable plan for self-care.  Not intoxicated.  Patient does not want to be in the hospital they have more.  I have discontinued the IVC.  As always he is counseled about his own safety having a place to stay and not drinking.  I will print out prescriptions for his usual medications as he may need those.  He is to follow-up with outpatient medical care as previously recommended.  Case reviewed with the ER doctor and TTS  Disposition: No evidence of imminent risk to self or others at present.   Patient does not meet criteria for psychiatric inpatient admission. Supportive therapy provided about ongoing stressors. Discussed crisis plan, support from social network, calling 911, coming to the Emergency Department, and calling Suicide Hotline.  Alethia Berthold, MD 08/04/2020 11:12 AM

## 2020-08-04 NOTE — ED Notes (Signed)
Pt given crutches and bag of wound care supplies to take with him. Pt walked to front of lobby.

## 2020-08-04 NOTE — ED Notes (Signed)
Pt given belongings bags to change and be d/c.

## 2020-08-04 NOTE — ED Notes (Signed)
Pt given lunch tray and juice at this time.

## 2020-08-04 NOTE — ED Notes (Signed)
IVC,pending reassesment in AM

## 2020-08-05 ENCOUNTER — Telehealth: Payer: Self-pay | Admitting: Emergency Medicine

## 2020-08-05 MED ORDER — DOXYCYCLINE HYCLATE 100 MG PO TABS: 100.0000 mg | ORAL_TABLET | Freq: Two times a day (BID) | ORAL | 0 refills | Status: DC

## 2020-08-05 MED ORDER — METOPROLOL TARTRATE 25 MG PO TABS: 25.0000 mg | ORAL_TABLET | Freq: Two times a day (BID) | ORAL | 2 refills | Status: AC

## 2020-08-05 MED ORDER — ASPIRIN EC 81 MG PO TBEC: 81.0000 mg | DELAYED_RELEASE_TABLET | Freq: Every day | ORAL | 2 refills | Status: AC

## 2020-08-05 MED ORDER — OLANZAPINE 7.5 MG PO TABS: 7.5000 mg | ORAL_TABLET | Freq: Every day | ORAL | 0 refills | Status: AC

## 2020-08-05 NOTE — Telephone Encounter (Signed)
Prescriptions were called into the wrong [facility].  I have switched the prescriptions to [facility] [employer] [employer].

## 2020-08-21 ENCOUNTER — Emergency Department (HOSPITAL_COMMUNITY): Payer: 59

## 2020-08-21 ENCOUNTER — Other Ambulatory Visit: Payer: Self-pay

## 2020-08-21 ENCOUNTER — Emergency Department (HOSPITAL_COMMUNITY)
Admission: EM | Admit: 2020-08-21 | Discharge: 2020-08-22 | Disposition: A | Discharge status: Home / Self Care | Payer: 59 | Attending: Emergency Medicine | Admitting: Emergency Medicine

## 2020-08-21 ENCOUNTER — Encounter (HOSPITAL_COMMUNITY): Payer: Self-pay | Admitting: Emergency Medicine

## 2020-08-21 DIAGNOSIS — I10 Essential (primary) hypertension: Secondary | ICD-10-CM | POA: Insufficient documentation

## 2020-08-21 DIAGNOSIS — Z7982 Long term (current) use of aspirin: Secondary | ICD-10-CM | POA: Diagnosis not present

## 2020-08-21 DIAGNOSIS — M7989 Other specified soft tissue disorders: Secondary | ICD-10-CM

## 2020-08-21 DIAGNOSIS — Z87891 Personal history of nicotine dependence: Secondary | ICD-10-CM | POA: Diagnosis not present

## 2020-08-21 DIAGNOSIS — F22 Delusional disorders: Secondary | ICD-10-CM | POA: Diagnosis not present

## 2020-08-21 DIAGNOSIS — Z79899 Other long term (current) drug therapy: Secondary | ICD-10-CM | POA: Diagnosis not present

## 2020-08-21 DIAGNOSIS — L97519 Non-pressure chronic ulcer of other part of right foot with unspecified severity: Secondary | ICD-10-CM | POA: Insufficient documentation

## 2020-08-21 DIAGNOSIS — Z20822 Contact with and (suspected) exposure to covid-19: Secondary | ICD-10-CM | POA: Insufficient documentation

## 2020-08-21 DIAGNOSIS — R2240 Localized swelling, mass and lump, unspecified lower limb: Secondary | ICD-10-CM | POA: Diagnosis present

## 2020-08-21 LAB — CBC WITH DIFFERENTIAL/PLATELET
Abs Immature Granulocytes: 0.02 10*3/uL (ref 0.00–0.07)
Basophils Absolute: 0 10*3/uL (ref 0.0–0.1)
Basophils Relative: 1 %
Eosinophils Absolute: 0.1 10*3/uL (ref 0.0–0.5)
Eosinophils Relative: 1 %
HCT: 28.1 % — ABNORMAL LOW (ref 39.0–52.0)
Hemoglobin: 9.7 g/dL — ABNORMAL LOW (ref 13.0–17.0)
Immature Granulocytes: 0 %
Lymphocytes Relative: 24 %
Lymphs Abs: 1.5 10*3/uL (ref 0.7–4.0)
MCH: 30.6 pg (ref 26.0–34.0)
MCHC: 34.5 g/dL (ref 30.0–36.0)
MCV: 88.6 fL (ref 80.0–100.0)
Monocytes Absolute: 0.7 10*3/uL (ref 0.1–1.0)
Monocytes Relative: 11 %
Neutro Abs: 3.8 10*3/uL (ref 1.7–7.7)
Neutrophils Relative %: 63 %
Platelets: 408 10*3/uL — ABNORMAL HIGH (ref 150–400)
RBC: 3.17 MIL/uL — ABNORMAL LOW (ref 4.22–5.81)
RDW: 13.2 % (ref 11.5–15.5)
WBC: 6.1 10*3/uL (ref 4.0–10.5)
nRBC: 0 % (ref 0.0–0.2)

## 2020-08-21 LAB — ACETAMINOPHEN LEVEL: Acetaminophen (Tylenol), Serum: <10 ug/mL — ABNORMAL LOW (ref 10–30)

## 2020-08-21 LAB — COMPREHENSIVE METABOLIC PANEL
ALT: 10 U/L (ref 0–44)
AST: 16 U/L (ref 15–41)
Albumin: 2.4 g/dL — ABNORMAL LOW (ref 3.5–5.0)
Alkaline Phosphatase: 50 U/L (ref 38–126)
Anion gap: 8 (ref 5–15)
BUN: 7 mg/dL (ref 6–20)
CO2: 24 mmol/L (ref 22–32)
Calcium: 8.3 mg/dL — ABNORMAL LOW (ref 8.9–10.3)
Chloride: 100 mmol/L (ref 98–111)
Creatinine, Ser: 0.96 mg/dL (ref 0.61–1.24)
GFR, Estimated: >60 mL/min (ref >60)
Glucose, Bld: 101 mg/dL — ABNORMAL HIGH (ref 70–99)
Potassium: 2.5 mmol/L — CL (ref 3.5–5.1)
Sodium: 132 mmol/L — ABNORMAL LOW (ref 135–145)
Total Bilirubin: 0.8 mg/dL (ref 0.3–1.2)
Total Protein: 6.9 g/dL (ref 6.5–8.1)

## 2020-08-21 LAB — RESP PANEL BY RT-PCR (FLU A&B, COVID) ARPGX2
Influenza A by PCR: NEGATIVE
Influenza B by PCR: NEGATIVE
SARS Coronavirus 2 by RT PCR: NEGATIVE

## 2020-08-21 LAB — SALICYLATE LEVEL: Salicylate Lvl: <7 mg/dL — ABNORMAL LOW (ref 7.0–30.0)

## 2020-08-21 LAB — ETHANOL: Alcohol, Ethyl (B): <10 mg/dL (ref <10)

## 2020-08-21 LAB — RAPID URINE DRUG SCREEN, HOSP PERFORMED
Amphetamines: NOT DETECTED
Barbiturates: NOT DETECTED
Benzodiazepines: NOT DETECTED
Cocaine: NOT DETECTED
Opiates: NOT DETECTED
Tetrahydrocannabinol: NOT DETECTED

## 2020-08-21 MED ORDER — ZOLPIDEM TARTRATE 5 MG PO TABS: 5.0000 mg | ORAL_TABLET | Freq: Every evening | ORAL | Status: DC | PRN

## 2020-08-21 MED ORDER — ONDANSETRON HCL 4 MG PO TABS: 4.0000 mg | ORAL_TABLET | Freq: Three times a day (TID) | ORAL | Status: DC | PRN

## 2020-08-21 MED ORDER — POTASSIUM CHLORIDE ER 10 MEQ PO TBCR: 10.0000 meq | EXTENDED_RELEASE_TABLET | Freq: Every day | ORAL | 0 refills | Status: AC

## 2020-08-21 MED ORDER — DOXYCYCLINE HYCLATE 100 MG PO CAPS: 100.0000 mg | ORAL_CAPSULE | Freq: Two times a day (BID) | ORAL | 0 refills | Status: DC

## 2020-08-21 MED ORDER — DOXYCYCLINE HYCLATE 100 MG PO TABS
100.0000 mg | ORAL_TABLET | Freq: Once | ORAL | Status: AC
  Administered 2020-08-21: 100 mg via ORAL
  Filled 2020-08-21: qty 1

## 2020-08-21 MED ORDER — ACETAMINOPHEN 325 MG PO TABS: 650.0000 mg | ORAL_TABLET | ORAL | Status: DC | PRN

## 2020-08-21 MED ORDER — ALUM & MAG HYDROXIDE-SIMETH 200-200-20 MG/5ML PO SUSP: 30.0000 mL | Freq: Four times a day (QID) | ORAL | Status: DC | PRN

## 2020-08-21 MED ORDER — DOXYCYCLINE HYCLATE 100 MG PO CAPS: 100.0000 mg | ORAL_CAPSULE | Freq: Two times a day (BID) | ORAL | 0 refills | Status: AC

## 2020-08-21 MED ORDER — POTASSIUM CHLORIDE 10 MEQ/100ML IV SOLN
10.0000 meq | Freq: Once | INTRAVENOUS | Status: AC
  Administered 2020-08-21: 10 meq via INTRAVENOUS
  Filled 2020-08-21: qty 100

## 2020-08-21 MED ORDER — MAGNESIUM SULFATE 2 GM/50ML IV SOLN
2.0000 g | Freq: Once | INTRAVENOUS | Status: AC
  Administered 2020-08-22: 2 g via INTRAVENOUS
  Filled 2020-08-21: qty 50

## 2020-08-21 NOTE — BH Assessment (Signed)
Comprehensive Clinical Assessment (CCA) Note  08/21/2020 Tristan Cook [id number]  DISPOSITION: Gave clinical report to Quintella Reichert, NP who determined Pt does not meet criteria for inpatient psychiatric treatment and states Pt is psychiatrically cleared. Pt was offered referrals for treatment for mental health and substance abuse treatment and he declined. Pt's current presentation does not indicate that he is at imminent risk for harm to himself or others and he does not meet criteria for involuntary commitment.  The patient demonstrates the following risk factors for suicide: Chronic risk factors for suicide include: substance use disorder. Acute risk factors for suicide include: social withdrawal/isolation. Protective factors for this patient include: religious beliefs against suicide. Considering these factors, the overall suicide risk at this point appears to be low. Patient is appropriate for outpatient follow up.  [facility] ED from 08/21/2020 in [facility] from 08/03/2020 in [facility] ED from 07/26/2020 in [place] No Risk No Risk No Risk      Pt is a 60 year old single male who presents unaccompanied to [facility] ED via EMS due to right foot pain. Pt has a history of alcohol used and mental health symptoms including major depressive disorder and delusional disorder. Pt has presented to area EDs several times with alcohol intoxication and possible delirium. Per ED record, this evening the patient was found in the bushes between a gas station in a restaurant, he was sitting minding his own business beside a garbage can.  When the police officers asked him what he was doing he states that he was minding his own business that he was waiting for [place] to pop out of the garbage can and that he had $7000 that were currently being held by the [place] of the city.  EDP states law enforcement is concerned that Pt is wandering, delusional, and might walk into traffic.  During assessment, Pt states that he came to ED  to pain in his right foot. He says his shoes were wet and he believes that is how it became infected. Pt describes his mood recently as "fine." He denies depressive symptoms. He denies any problems with anxiety. He denies problems with sleep. Pt describes his appetite as good. He denies current suicidal ideation or history of suicide attempts. He denies current homicidal ideation or history of aggression. He denies auditory or visual hallucinations. He denies feeling paranoid. He indicates that he is very religious and loves Jesus.  Pt acknowledges that he drinks alcohol. He says he often drinks daily but has not had anything to drink in the past two days. He denies any other substance use. Pt's blood alcohol level and urine drugs screen are negative.  Pt identifies his foot pain as his primary stressor. He says he does not have a permanent residence and stays in [place]. He confirms he receives disability. He cannot identify any family or friends who are supportive and says he stays to himself. He denies history of abuse or trauma. He current legal problems. He denies access to firearms.  Pt says he has no mental health providers and is not taking any psychiatric medications. He says he has received mental health and substance abuse treatment in the past. Pt says he recently spoke to a psychiatrist at [facility] and Pt's medical record states he was evaluated and psychiatrically cleared in the [facility] ED by Dr. Alethia Berthold on 08/04/2020.  Pt is alert and  oriented to person, place, time, and situation. Pt speaks in a clear tone, at moderate volume and normal pace. Motor behavior appears normal. Eye contact is good. Pt's mood is euthymic and affect is congruent with mood. Thought process is coherent and relevant. There is no indication from Pt's report or behavior  that he is currently responding to internal stimuli.   Discussed mental health and substance abuse treatment options with Pt. He says he is not interested in treatment for mental health or alcohol use.    Chief Complaint:  Chief Complaint  Patient presents with   Recurrent Skin Infections   Visit Diagnosis:  F10.20 Alcohol use disorder, Severe   CCA Screening, Triage and Referral (STR)  Patient Reported Information How did you hear about [place]? Legal System  Referral name: Law Enforcement  Referral phone number: No data recorded  Whom do you see for routine medical problems? I don't have a doctor  Practice/Facility Name: No data recorded Practice/Facility Phone Number: No data recorded Name of Contact: No data recorded Contact Number: No data recorded Contact Fax Number: No data recorded Prescriber Name: No data recorded Prescriber Address (if known): No data recorded  What Is the Reason for Your Visit/Call Today? Patient brought to the ER after wondering around  How Long Has This Been Causing You Problems? > than 6 months  What Do You Feel Would Help You the Most Today? Alcohol or Drug Use Treatment   Have You Recently Been in Any Inpatient Treatment (Hospital/Detox/Crisis Center/28-Day Program)? No  Name/Location of Program/Hospital:[facility] in 06/05/19  How Long Were You There? See Epic chart  When Were You Discharged? 06/09/19   Have You Ever Received Services From [employer] Before? No  Who Do You See at [facility]? [place]   Have You Recently Had Any Thoughts About Hurting Yourself? No  Are You Planning to Commit Suicide/Harm Yourself At This time? No   Have you Recently Had Thoughts About [place]? No  Explanation: No data recorded  Have You Used Any Alcohol or Drugs in the Past 24 Hours? Yes  How Long Ago Did You Use Drugs or Alcohol? 1700  What Did You Use and How Much? Alcohol   Do You Currently Have a Therapist/Psychiatrist?  No  Name of Therapist/Psychiatrist: No data recorded  Have You Been Recently Discharged From Any Office Practice or Programs? No  Explanation of Discharge From Practice/Program: No data recorded    CCA Screening Triage Referral Assessment Type of Contact: Face-to-Face  Is this Initial or Reassessment? No data recorded Date Telepsych consult ordered in [facility]:  10/03/19  Time Telepsych consult ordered in [facility]:  2254   Patient Reported Information Reviewed? Yes  Patient Left Without Being Seen? No data recorded Reason for Not Completing Assessment: No data recorded  Collateral Involvement: n/a   Does Patient Have a Court Appointed Legal Guardian? No data recorded Name and Contact of Legal Guardian: Self  If Minor and Not Living with Parent(s), Who has Custody? n/a  Is CPS involved or ever been involved? Never  Is APS involved or ever been involved? Never   Patient Determined To Be At Risk for Harm To Self or Others Based on Review of Patient Reported Information or Presenting Complaint? No  Method: No data recorded Availability of Means: No data recorded Intent: No data recorded Notification Required: No data recorded Additional Information for Danger to Others Potential: No data recorded Additional Comments for Danger to Others Potential: No data recorded Are There  Guns or Other Weapons in [place]? No data recorded Types of Guns/Weapons: No data recorded Are These Weapons Safely Secured?                            No data recorded Who Could Verify You Are Able To Have These Secured: No data recorded Do You Have any Outstanding Charges, Pending Court Dates, Parole/Probation? No data recorded Contacted To Inform of Risk of Harm To Self or Others: No data recorded  Location of Assessment: [facility] ED   Does Patient Present under Involuntary Commitment? Yes  IVC Papers Initial File Date: 08/04/20   [place] of Residence: [place]   Patient Currently Receiving the  Following Services: Not Receiving Services   Determination of Need: Emergent (2 hours)   Options For Referral: Outpatient Therapy     CCA Biopsychosocial Intake/Chief Complaint:  Patient presents under IVC due to wondering around disoriented  Current Symptoms/Problems: Patient presents under IVC due to wondering around disoriented   Patient Reported Schizophrenia/Schizoaffective Diagnosis in Past: No   Strengths: Patient is able to communicate  Preferences: Unknown  Abilities: Patient is able to communicate   Type of Services Patient Feels are Needed: Unknown   Initial Clinical Notes/Concerns: None   Mental Health Symptoms Depression:   Change in energy/activity; Fatigue   Duration of Depressive symptoms:  Greater than two weeks   Mania:   None   Anxiety:    None   Psychosis:   None   Duration of Psychotic symptoms: No data recorded  Trauma:   None   Obsessions:   None   Compulsions:   None   Inattention:   None   Hyperactivity/Impulsivity:   N/A   Oppositional/Defiant Behaviors:   None   Emotional Irregularity:   None   Other Mood/Personality Symptoms:   NA    Mental Status Exam Appearance and self-care  Stature:   Average   Weight:   Average weight   Clothing:   -- (Covered by blanket)   Grooming:   Neglected   Cosmetic use:   None   Posture/gait:   Normal   Motor activity:   Not Remarkable   Sensorium  Attention:   Normal   Concentration:   Normal   Orientation:   X5   Recall/memory:   Normal   Affect and Mood  Affect:   Appropriate   Mood:   Euthymic   Relating  Eye contact:   Normal   Facial expression:   Responsive   Attitude toward examiner:   Cooperative   Thought and Language  Speech flow:  Clear and Coherent   Thought content:   Appropriate to Mood and Circumstances   Preoccupation:   None   Hallucinations:   None   Organization:  No data recorded  [employer] of Knowledge:   Fair   Intelligence:   Average   Abstraction:   Normal   Judgement:   Fair   Art therapist:   Variable   Insight:   Lacking   Decision Making:   Impulsive   Social Functioning  Social Maturity:   Isolates   Social Judgement:   Heedless   Stress  Stressors:   Teacher, music Ability:   Deficient supports   Skill Deficits:   Responsibility   Supports:   Support needed     Religion: Religion/Spirituality Are You A Religious Person?: Yes What is Your Religious Affiliation?: [place]  How Might This Affect Treatment?: NA  Leisure/Recreation: Leisure / Recreation Do You Have Hobbies?: Yes Leisure and Hobbies: Pt reports he enjoys video games.  Exercise/Diet: Exercise/Diet Do You Exercise?: No Have You Gained or Lost A Significant Amount of Weight in the Past Six Months?: No Do You Follow a Special Diet?: No Do You Have Any Trouble Sleeping?: No   CCA Employment/Education Employment/Work Situation: Employment / Work Situation Employment Situation: On disability Why is Patient on Disability: Pt reports "my wrist, knees, ankles". How Long has Patient Been on Disability: Pt reports "Oct 2019" Patient's Job has Been Impacted by Current Illness: No Has Patient ever Been in the [employer]?: No  Education: Education Is Patient Currently Attending School?: No Did You Nutritional therapist?: No Did You Have An Individualized Education Program (IIEP): No Did You Have Any Difficulty At School?: No Patient's Education Has Been Impacted by Current Illness: No   CCA Family/Childhood History Family and Relationship History: Family history Marital status: Single Does patient have children?: Yes How many children?: 1  Childhood History:  Childhood History By whom was/is the patient raised?: Mother Did patient suffer any verbal/emotional/physical/sexual abuse as a child?: No Did patient suffer from severe childhood  neglect?: No Has patient ever been sexually abused/assaulted/raped as an adolescent or adult?: No Was the patient ever a victim of a crime or a disaster?: No Witnessed domestic violence?: No Has patient been affected by domestic violence as an adult?: No  Child/Adolescent Assessment:     CCA Substance Use Alcohol/Drug Use: Alcohol / Drug Use Pain Medications: (P) See PTA Prescriptions: (P) See PTA Over the Counter: (P) See PTA History of alcohol / drug use?: (P) Yes Longest period of sobriety (when/how long): (P) Unable quantify Negative Consequences of Use: (P) Personal relationships, Financial Withdrawal Symptoms: (P)  (Pt denies) Substance #1 Name of Substance 1: (P) Alcohol 1 - Age of First Use: (P) Adolescent 1 - Amount (size/oz): (P) 12 pack of beer 1 - Frequency: (P) Daily 1 - Duration: (P) Ongoing 1 - Last Use / Amount: (P) 08/19/2020 1 - Method of Aquiring: (P) Store 1- Route of Use: (P) Drinking                       ASAM's:  Six Dimensions of Multidimensional Assessment  Dimension 1:  Acute Intoxication and/or Withdrawal Potential:   Dimension 1:  Description of individual's past and current experiences of substance use and withdrawal: Pt denies having withdrawal symptoms  Dimension 2:  Biomedical Conditions and Complications:   Dimension 2:  Description of patient's biomedical conditions and  complications: Pt has multiple health concerns  Dimension 3:  Emotional, Behavioral, or Cognitive Conditions and Complications:  Dimension 3:  Description of emotional, behavioral, or cognitive conditions and complications: Major Depressive Disorder  Dimension 4:  Readiness to Change:  Dimension 4:  Description of Readiness to Change criteria: Pt says he does not want substance abuse treatment  Dimension 5:  Relapse, Continued use, or Continued Problem Potential:  Dimension 5:  Relapse, continued use, or continued problem potential critiera description: Pt has chronic  pain  Dimension 6:  Recovery/Living Environment:  Dimension 6:  Recovery/Iiving environment criteria description: Pt has psychosocial problem of homelessness  ASAM Severity Score: ASAM's Severity Rating Score: 15  ASAM Recommended Level of Treatment: ASAM Recommended Level of Treatment: Level III Residential Treatment   Substance use Disorder (SUD) Substance Use Disorder (SUD)  Checklist Symptoms of Substance Use: Continued use despite having  a persistent/recurrent physical/psychological problem caused/exacerbated by use, Substance(s) often taken in larger amounts or over longer times than was intended, Continued use despite persistent or recurrent social, interpersonal problems, caused or exacerbated by use, Evidence of tolerance, Presence of craving or strong urge to use, Social, occupational, recreational activities given up or reduced due to use, Large amounts of time spent to obtain, use or recover from the substance(s), Recurrent use that results in a failure to fulfill major role obligations (work, school, home), Repeated use in physically hazardous situations, Persistent desire or unsuccessful efforts to cut down or control use  Recommendations for Services/Supports/Treatments: Recommendations for Services/Supports/Treatments Recommendations For Services/Supports/Treatments: Residential-Level 3  DSM5 Diagnoses: Patient Active Problem List   Diagnosis Date Noted   Delirium in remission 08/04/2020   Homelessness 10/04/2019   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   Acute psychosis ([place]) 06/05/2019   Delusional disorder ([place]) 05/24/2019   Swelling of limb 03/11/2019   Peripheral polyneuropathy 02/12/2019   Alcohol intoxication ([place]) 08/27/2018   Hallucinations 08/27/2018   MDD (major depressive disorder), recurrent, severe, 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/27/2018   Alcohol use disorder, severe, dependence ([place]) 08/27/2018   TIA (transient ischemic attack) 05/29/2018    Syncope 05/29/2018   Alcohol abuse 05/29/2018   Fall 05/29/2018   Dislocation of left shoulder joint 05/29/2018   Closed fracture of left proximal humerus 05/29/2018   Abnormal LFTs 05/29/2018   Hypokalemia 05/29/2018   Constipation 10/17/2017   Hoarseness 10/17/2017   Weakness 04/11/2017   Weakness of both legs 04/10/2017   Vitamin B deficiency 01/22/2017   Lead exposure 01/22/2017   Stroke, small vessel ([place]) 56/38/7564   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   Vitamin B12 deficiency neuropathy ([place]) 01/17/2017   Adjustment disorder with depressed mood 08/15/2016   History of colonic polyps 06/14/2016   Elevated blood pressure reading 06/14/2016   Hemorrhoids 09/20/2015   Depressive disorder 08/03/2015   Back pain with radiation 06/15/2015   Insomnia 06/03/2015   Hyperlipidemia LDL goal <130 04/25/2015   Low serum testosterone 04/16/2015   Fatigue 03/29/2015   Allergic rhinitis 02/10/2015   Esophageal stricture 10/22/2014   Overweight (BMI 25.0-29.9) 10/06/2013   GERD (gastroesophageal reflux disease) 04/23/2012   Hemorrhoids, internal 11/02/2011   Essential hypertension 07/25/2007   Neck pain on left side 07/25/2007    Patient Centered Plan: Patient is on the following Treatment Plan(s):  Substance Abuse   Referrals to Alternative Service(s): Referred to Alternative Service(s):   Place:   Date:   Time:    Referred to Alternative Service(s):   Place:   Date:   Time:    Referred to Alternative Service(s):   Place:   Date:   Time:    Referred to Alternative Service(s):   Place:   Date:   Time:     Evelena Peat, [facility]

## 2020-08-21 NOTE — ED Notes (Signed)
Date and time results received: 08/21/20 2254  Test: potassium Critical Value: 2.5  Name of Provider Notified: Sabra Heck, MD  Orders Received? Or Actions Taken?: acknowledged

## 2020-08-21 NOTE — Discharge Instructions (Addendum)
Please take doxycycline twice a day for the next 10 days,  See your doctor for a follow-up within 1 week  Please see your psychiatrist this week, make sure you are taking your medications exactly as prescribed  You are welcome to return should any symptoms worsen  Outpatient Psychiatry and Counseling  [facility]: Mobile Crisis Management 24 hours:  [phone]  [facility] of the [employer] sliding scale fee and walk in schedule: M-F 8am-12pm/1pm-3pm [place], [place] [address] [place], [place] [address] [phone]  [facility] (Formerly known as [facility] [employer])- new patient walk-in appointments available Monday - Friday 8am -3pm.          [address] [place], [place] [address] [phone] or crisis line- [facility]/ Intensive Outpatient Therapy Program [place], [place] [address] [place]      [phone] [place], [place] [address]                 [place]   [facility] [phone]. [place], [place] [address]   [employer] of Care          [address] Johnette Abraham  [place], [place] [address]       [phone]  [place], [place], [place] [address] [phone]  [facility]    [address] [place], [place] [address]     [place], [place] Joycelyn Man     [place] [address]     [phone]       [facility] [place] [address]  [facility]     [address] [place], [place], [place], [place] [address] [place]     [address]     [place], [place] [address]     [phone]       [facility] [address] [place], [place] [address] [phone] Resources for Temporary Residential Assistance/Crisis [facility]) M-F 8am-3pm   [address], [place] [address]   [phone] Services include: laundry, barbering, support groups, case management, phone  & computer access, showers, AA/NA mtgs, mental health/substance abuse nurse, job skills class, disability information, VA assistance, spiritual classes, etc.   [facility]   [address] [place]     [place] (women and children)       [place]. [place], [place] [address] [phone] [email or web address] for application and process Application Required  [facility]   [address]    [place] [address]     [phone]  [place], [place] [address] [phone](JOINOMVE application appt.) Application Required  [facility] (women only)    [address]     [place], [place] [address]     [phone]      Intake starts 6pm daily Need valid ID, SSC, & Police report [employer] [address] [place], [place] [phone] Application Required  [employer] (men only)     [place].      [place], [place]       [place] (Pregnant women only) [address] [place], [place]  [facility]      [place] Dani [address].      [place], [place] [address]     [phone]             [facility] [address] [place], [place] 90 day  commitment/SA/Application process  [facility](men only)     [address]     [place], [place]       Check-in at [facility] of [facility] [address] [place], [place] [address] [phone] Men/Women/Women and Children must be there by 7 pm  [place], [place]

## 2020-08-21 NOTE — ED Triage Notes (Signed)
Pt brought in by EMS for concerns for infection in right foot. Pt recently seen at [facility] for psych eval. Pt was reported missing several days ago and was found tonight by [employer] at [employer].

## 2020-08-21 NOTE — ED Provider Notes (Signed)
[facility] EMERGENCY DEPARTMENT Provider Note   CSN: [id number] Arrival date & time: 08/21/20  2045     History Chief Complaint  Patient presents with   Recurrent Skin Infections    Tristan Cook is a 60 y.o. male.  HPI  This patient is a 60 year old male, he has a history of alcohol abuse per the medical record, there is a history of possible stroke, the patient endorses having a mental health history and according to the record it appears that he has homelessness, alcohol induced psychosis, history of delusional disorder as well as some major depressive disorder as well.  Review of the medical record shows that this patient has been seen at the emergency department multiple times especially in the last couple of months for a mix of either alcohol abuse or psychiatric evaluation because of confusion and delirium.  Over the last month the patient has had 3 different visits to the emergency department where he was found wandering around local stores or parking lots or sitting in somebody else's car doing no harm to anybody but appearing to be confused.  This evening the patient was found in the bushes between a gas station in a restaurant, he was sitting minding his own business beside a garbage can.  When the police officers asked him what he was doing he states that he was minding his own business that he was waiting for [place] to pop out of the garbage can and that he had $7000 that were currently being held by the [place] of the city.  The patient cannot tell me how he got to this location from [place] where he usually lives, there was a welfare check that was called in from his family members who had not seen him in a couple of days.  The patient denies any thoughts of suicide, homicide or abuse to anybody else.  He has no thoughts of being violent to others, he denies any hallucinations but has clear delusional thoughts.  The patient denies taking any medications  The patient was also  noted to have a right foot that was swollen with a ulcer on the bottom of the foot.  When asked about this the patient states it does not hurt, it swells when he walks too much on it.  Review of the medical record shows that he has been seen for this as well in the past and started on doxycycline.  It is unclear whether he continues to take this medication as an outpatient.  He denies fevers or chills and denies any spreading redness around these areas.  The patient has no other physical complaints and frankly denies fevers chills nausea vomiting diarrhea, he denies chest pain shortness of breath coughing abdominal pain or difficulty urinating.  He states he has been eating and has no complaints at this time  The patient decided to come to the emergency department willingly as the other option was to be involuntarily committed by the police prehospital per the policeman's report.  Past Medical History:  Diagnosis Date   Abdominal pain    Adenomatous colon polyp 2008   Anxiety    Arthritis    Back pain    Bilateral lumbar radiculopathy    Blurred vision, bilateral    Chronic back pain    Constipation    Depression    GERD (gastroesophageal reflux disease)    Gout    Hemorrhoids    High cholesterol    Hypertension    Neck pain  Neuropathy    Neuropathy of both feet    Numbness    rectal   Numbness of legs    Stroke ([place]) 2019   Substance abuse ([place])    h/o excessive alcohol use; pt says he limits use to one to 2 beers daily he currently    Patient Active Problem List   Diagnosis Date Noted   Delirium in remission 08/04/2020   Homelessness 10/04/2019   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   Acute psychosis ([place]) 06/05/2019   Delusional disorder ([place]) 05/24/2019   Swelling of limb 03/11/2019   Peripheral polyneuropathy 02/12/2019   Alcohol intoxication ([place]) 08/27/2018   Hallucinations 08/27/2018   MDD (major depressive disorder), recurrent, severe,  with psychosis ([place]) 08/27/2018   Alcohol use disorder, severe, dependence ([place]) 08/27/2018   TIA (transient ischemic attack) 05/29/2018   Syncope 05/29/2018   Alcohol abuse 05/29/2018   Fall 05/29/2018   Dislocation of left shoulder joint 05/29/2018   Closed fracture of left proximal humerus 05/29/2018   Abnormal LFTs 05/29/2018   Hypokalemia 05/29/2018   Constipation 10/17/2017   Hoarseness 10/17/2017   Weakness 04/11/2017   Weakness of both legs 04/10/2017   Vitamin B deficiency 01/22/2017   Lead exposure 01/22/2017   Stroke, small vessel ([place]) 56/43/3295   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   Vitamin B12 deficiency neuropathy ([place]) 01/17/2017   Adjustment disorder with depressed mood 08/15/2016   History of colonic polyps 06/14/2016   Elevated blood pressure reading 06/14/2016   Hemorrhoids 09/20/2015   Depressive disorder 08/03/2015   Back pain with radiation 06/15/2015   Insomnia 06/03/2015   Hyperlipidemia LDL goal <130 04/25/2015   Low serum testosterone 04/16/2015   Fatigue 03/29/2015   Allergic rhinitis 02/10/2015   Esophageal stricture 10/22/2014   Overweight (BMI 25.0-29.9) 10/06/2013   GERD (gastroesophageal reflux disease) 04/23/2012   Hemorrhoids, internal 11/02/2011   Essential hypertension 07/25/2007   Neck pain on left side 07/25/2007    Past Surgical History:  Procedure Laterality Date   CATARACT EXTRACTION W/PHACO Right 08/16/2018   Procedure: CATARACT EXTRACTION PHACO AND INTRAOCULAR LENS PLACEMENT RIGHT EYE;  Surgeon: Baruch Goldmann, MD;  Location: [facility];  Service: Ophthalmology;  Laterality: Right;  CDE: 4.18   COLONOSCOPY  10/09/2006   3 mm pedunculated sigmoid colon polyp removed/8-mm sessile hepatic flexure polyp (tubular villous adenoma) removed/6-mm  descending colon polyp removed/small internal hemorrhoids   COLONOSCOPY  02/28/2011   JOA:CZYSAY, multiple in the rectum/Internal hemorrhoids, MODERATE-CAUSING RECTAL BLEEDING    COLONOSCOPY N/A 07/05/2016   Procedure: COLONOSCOPY;  Surgeon: [name], MD;  Location: [facility];  Service: Endoscopy;  Laterality: N/A;  11:15am   ESOPHAGOGASTRODUODENOSCOPY N/A 07/21/2014   SLF: 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 Mild erosive gastrtitis most likely due to indomethacin    FINGER SURGERY Right    4th and 5th digit   FLEXIBLE SIGMOIDOSCOPY  01/02/2012   SLF: 1. the colonic mucosa appeared normal in the sigmoid colon 2. Large internal hemorrhoids: cause for rectal bleeding/pain . s/p banding X 3    HAND SURGERY     REVERSE SHOULDER ARTHROPLASTY Left 05/29/2018   Procedure: REVERSE SHOULDER ARTHROPLASTY;  Surgeon: Hiram Gash, MD;  Location: [facility];  Service: Orthopedics;  Laterality: Left;   SAVORY DILATION N/A 07/21/2014   Procedure: SAVORY DILATION;  Surgeon: [name], MD;  Location: [facility];  Service: Endoscopy;  Laterality: N/A;       Family History  Problem Relation Age  of Onset   Hypertension Mother    Hypertension Father    Stroke Father    Hypertension Sister    Hypertension Brother    Cancer Brother    Cancer Brother        throat cancer   Hypertension Brother    Diabetes Maternal Grandmother    Colon cancer Neg Hx     Social History   Tobacco Use   Smoking status: Former    Pack years: 0.00    Types: Cigarettes    Quit date: 08/16/2004    Years since quitting: 16.0   Smokeless tobacco: Current   Tobacco comments:    no tobacco, has quit long time ago  Vaping Use   Vaping Use: Never used  Substance Use Topics   Alcohol use: Yes    Alcohol/week: 84.0 standard drinks    Types: 84 Cans of beer per week   Drug use: No    Home Medications Prior to Admission medications   Medication Sig Start Date End Date Taking? Authorizing Provider  doxycycline (VIBRAMYCIN) 100 MG capsule Take 1 capsule (100 mg total) by mouth 2 (two) times daily. 08/21/20  Yes Noemi Chapel, MD  aspirin EC 81 MG tablet Take 1 tablet (81  mg total) by mouth daily. Swallow whole. 08/05/20 08/05/21  Harvest Dark, MD  atorvastatin (LIPITOR) 20 MG tablet Take 1 tablet (20 mg total) by mouth daily. 08/04/20   Clapacs, Madie Reno, MD  hydrOXYzine (VISTARIL) 25 MG capsule Take 25 mg by mouth at bedtime as needed (insomnia).    [provider]  metoprolol tartrate (LOPRESSOR) 25 MG tablet Take 1 tablet (25 mg total) by mouth 2 (two) times daily. 08/05/20 08/05/21  Harvest Dark, MD  OLANZapine (ZYPREXA) 7.5 MG tablet Take 1 tablet (7.5 mg total) by mouth at bedtime. 08/05/20 08/05/21  Harvest Dark, MD  tetrahydrozoline-zinc (VISINE-AC) 0.05-0.25 % ophthalmic solution Place 2 drops into both eyes 3 (three) times daily as needed.    [provider]  thiamine (VITAMIN B-1) 50 MG tablet Take 1 tablet (50 mg total) by mouth daily. 08/04/20   Clapacs, Madie Reno, MD    Allergies    Hydrocodone, Benadryl [diphenhydramine hcl (sleep)], and Hydrocortisone  Review of Systems   Review of Systems  All other systems reviewed and are negative.  Physical Exam Updated Vital Signs BP 113/73 (BP Location: Left Arm)   Pulse 85   Temp 98.3 F (36.8 C) (Oral)   Resp 17   Ht 1.88 m (6\' 2" )   Wt 79.4 kg   SpO2 100%   BMI 22.47 kg/m   Physical Exam Vitals and nursing note reviewed.  Constitutional:      General: He is not in acute distress.    Appearance: He is well-developed.  HENT:     Head: Normocephalic and atraumatic.     Mouth/Throat:     Pharynx: No oropharyngeal exudate.  Eyes:     General: No scleral icterus.       Right eye: No discharge.        Left eye: No discharge.     Conjunctiva/sclera: Conjunctivae normal.     Pupils: Pupils are equal, round, and reactive to light.  Neck:     Thyroid: No thyromegaly.     Vascular: No JVD.  Cardiovascular:     Rate and Rhythm: Normal rate and regular rhythm.     Heart sounds: Normal heart sounds. No murmur heard.   No friction rub. No gallop.  Pulmonary:      Effort: Pulmonary effort is normal. No respiratory distress.     Breath sounds: Normal breath sounds. No wheezing or rales.  Abdominal:     General: Bowel sounds are normal. There is no distension.     Palpations: Abdomen is soft. There is no mass.     Tenderness: There is no abdominal tenderness.  Musculoskeletal:        General: Swelling present. No tenderness. Normal range of motion.     Cervical back: Normal range of motion and neck supple.     Right lower leg: Edema present.     Left lower leg: No edema.     Comments: Right foot appears diffusely swollen, there is pitting edema to the level of just below the knee on the right leg.  On the bottom of the right foot there does appear to be some shallow ulcerations, there is no foul-smelling drainage, he has no tenderness to palpation of the foot, there is no warmth or redness there.  Lymphadenopathy:     Cervical: No cervical adenopathy.  Skin:    General: Skin is warm and dry.     Findings: No erythema or rash.  Neurological:     Mental Status: He is alert.     Coordination: Coordination normal.     Comments: Awake alert and able to follow commands, seems neurologically intact, he does appear delusional  Psychiatric:        Behavior: Behavior normal.     Comments: The patient is calm and redirectable, he is able answer questions about location that he currently is and where he usually lives however he has multiple delusional thoughts as illustrated in the history above.  He denies being suicidal and does not have an affect about him that his dangerous or violent at all    ED Results / Procedures / Treatments   Labs (all labs ordered are listed, but only abnormal results are displayed) Labs Reviewed  COMPREHENSIVE METABOLIC PANEL - Abnormal; Notable for the following components:      Result Value   Sodium 132 (*)    Potassium 2.5 (*)    Glucose, Bld 101 (*)    Calcium 8.3 (*)    Albumin 2.4 (*)    All other components within  normal limits  CBC WITH DIFFERENTIAL/PLATELET - Abnormal; Notable for the following components:   RBC 3.17 (*)    Hemoglobin 9.7 (*)    HCT 28.1 (*)    Platelets 408 (*)    All other components within normal limits  SALICYLATE LEVEL - Abnormal; Notable for the following components:   Salicylate Lvl <6.4 (*)    All other components within normal limits  ACETAMINOPHEN LEVEL - Abnormal; Notable for the following components:   Acetaminophen (Tylenol), Serum <10 (*)    All other components within normal limits  RESP PANEL BY RT-PCR (FLU A&B, COVID) ARPGX2  ETHANOL  RAPID URINE DRUG SCREEN, HOSP PERFORMED    EKG None  Radiology DG Foot Complete Right  Result Date: 08/21/2020 CLINICAL DATA:  Concerns for infection in right foot.  Swelling. EXAM: RIGHT FOOT COMPLETE - 3+ VIEW COMPARISON:  None. FINDINGS: Soft tissue swelling diffusely. No acute bony abnormality. Specifically, no fracture, subluxation, or dislocation. No bone destruction seen to suggest osteomyelitis. IMPRESSION: No acute bony abnormality. Electronically Signed   By: Rolm Baptise M.D.   On: 08/21/2020 21:41    Procedures Procedures   Medications Ordered in ED Medications  acetaminophen (  TYLENOL) tablet 650 mg (has no administration in time range)  zolpidem (AMBIEN) tablet 5 mg (has no administration in time range)  ondansetron (ZOFRAN) tablet 4 mg (has no administration in time range)  alum & mag hydroxide-simeth (MAALOX/MYLANTA) 200-200-20 MG/5ML suspension 30 mL (has no administration in time range)  magnesium sulfate IVPB 2 g 50 mL (has no administration in time range)  potassium chloride 10 mEq in 100 mL IVPB (10 mEq Intravenous New Bag/Given 08/21/20 2327)  doxycycline (VIBRA-TABS) tablet 100 mg (100 mg Oral Given 08/21/20 2139)    ED Course  I have reviewed the triage vital signs and the nursing notes.  Pertinent labs & imaging results that were available during my care of the patient were reviewed by me and  considered in my medical decision making (see chart for details).    MDM Rules/Calculators/A&P                           We will avoid involuntary commitment at this time as the patient does appear to be no danger to himself or others.  That being said he seems disorganized, he has been gone from home for a couple of days and evidently living on the streets, he is not able to tell me what he is going to do, he talks about wanting to stay at a hotel and getting money from the [place] who is holding his wallet, he thinks that [place] is hiding in a garbage can.  Psych consult  This patient has been seen by psychiatry, he is not a danger to himself or others, they have recommended that he can be discharged and follow-up in the outpatient setting.  Doxycycline prescription has been given, potassium has been replaced  Final Clinical Impression(s) / ED Diagnoses Final diagnoses:  Foot swelling  Delusional disorder [facility])    Rx / DC Orders ED Discharge Orders          Ordered    doxycycline (VIBRAMYCIN) 100 MG capsule  2 times daily        08/21/20 2325             Noemi Chapel, MD 08/21/20 2337

## 2020-09-21 ENCOUNTER — Emergency Department (HOSPITAL_COMMUNITY)
Admission: EM | Admit: 2020-09-21 | Discharge: 2020-09-21 | Disposition: A | Discharge status: Home / Self Care | Payer: 59 | Attending: Emergency Medicine | Admitting: Emergency Medicine

## 2020-09-21 ENCOUNTER — Other Ambulatory Visit: Payer: Self-pay

## 2020-09-21 ENCOUNTER — Encounter (HOSPITAL_COMMUNITY): Payer: Self-pay | Admitting: Emergency Medicine

## 2020-09-21 DIAGNOSIS — M7918 Myalgia, other site: Secondary | ICD-10-CM | POA: Diagnosis present

## 2020-09-21 DIAGNOSIS — Z87891 Personal history of nicotine dependence: Secondary | ICD-10-CM | POA: Insufficient documentation

## 2020-09-21 DIAGNOSIS — Z79899 Other long term (current) drug therapy: Secondary | ICD-10-CM | POA: Diagnosis not present

## 2020-09-21 DIAGNOSIS — I10 Essential (primary) hypertension: Secondary | ICD-10-CM | POA: Diagnosis not present

## 2020-09-21 DIAGNOSIS — Z7982 Long term (current) use of aspirin: Secondary | ICD-10-CM | POA: Diagnosis not present

## 2020-09-21 DIAGNOSIS — M2550 Pain in unspecified joint: Secondary | ICD-10-CM

## 2020-09-21 MED ORDER — ACETAMINOPHEN 500 MG PO TABS
1000.0000 mg | ORAL_TABLET | Freq: Once | ORAL | Status: AC
  Administered 2020-09-21: 1000 mg via ORAL
  Filled 2020-09-21: qty 2

## 2020-09-21 MED ORDER — IBUPROFEN 400 MG PO TABS
600.0000 mg | ORAL_TABLET | Freq: Once | ORAL | Status: AC
Start: 2020-09-21 — End: 2020-09-21
  Administered 2020-09-21: 600 mg via ORAL
  Filled 2020-09-21: qty 2

## 2020-09-21 NOTE — ED Provider Notes (Signed)
[facility] EMERGENCY DEPARTMENT Provider Note   CSN: [id number] Arrival date & time: 09/21/20  0746     History Chief Complaint  Patient presents with   Leg Pain    Tristan Cook is a 60 y.o. male.  HPI 60 year old male presents with chronic pain.  He states he has been having diffuse pain throughout his body for at least 3 years.  Over the last month its been gradually worsening, he thinks the weather is involved.  He denies fevers, joint swelling, or new weakness.  He has chronic neuropathy in his legs.  He also has a chronic wound to his right foot that he states has been not getting worse or better over the last month.  No drainage.  He has not been take anything for pain and states nothing has been working.  He is asking to be admitted to a rehab facility as he thinks this would help get him recovered. He is currently homeless.   Past Medical History:  Diagnosis Date   Abdominal pain    Adenomatous colon polyp 2008   Anxiety    Arthritis    Back pain    Bilateral lumbar radiculopathy    Blurred vision, bilateral    Chronic back pain    Constipation    Depression    GERD (gastroesophageal reflux disease)    Gout    Hemorrhoids    High cholesterol    Hypertension    Neck pain    Neuropathy    Neuropathy of both feet    Numbness    rectal   Numbness of legs    Stroke ([place]) 2019   Substance abuse (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   Acute psychosis ([place]) 06/05/2019   Delusional disorder ([place]) 05/24/2019   Swelling of limb 03/11/2019   Peripheral polyneuropathy 02/12/2019   Alcohol intoxication ([place]) 08/27/2018   Hallucinations 08/27/2018   MDD (major depressive disorder), recurrent, severe, with psychosis ([place])  08/27/2018   Alcohol use disorder, severe, dependence ([place]) 08/27/2018   TIA (transient ischemic attack) 05/29/2018   Syncope 05/29/2018   Alcohol abuse 05/29/2018   Fall 05/29/2018   Dislocation of left shoulder joint 05/29/2018   Closed fracture of left proximal humerus 05/29/2018   Abnormal LFTs 05/29/2018   Hypokalemia 05/29/2018   Constipation 10/17/2017   Hoarseness 10/17/2017   Weakness 04/11/2017   Weakness of both legs 04/10/2017   Vitamin B deficiency 01/22/2017   Lead exposure 01/22/2017   Stroke, small vessel ([place]) 25/42/7062   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   Vitamin B12 deficiency neuropathy ([place]) 01/17/2017   Adjustment disorder with depressed mood 08/15/2016   History of colonic polyps 06/14/2016   Elevated blood pressure reading 06/14/2016   Hemorrhoids 09/20/2015   Depressive disorder 08/03/2015   Back pain with radiation 06/15/2015   Insomnia 06/03/2015   Hyperlipidemia LDL goal <130 04/25/2015   Low serum testosterone 04/16/2015   Fatigue 03/29/2015   Allergic rhinitis 02/10/2015   Esophageal stricture 10/22/2014   Overweight (BMI 25.0-29.9) 10/06/2013   GERD (gastroesophageal reflux disease) 04/23/2012   Hemorrhoids, internal 11/02/2011   Essential hypertension 07/25/2007   Neck pain on left side 07/25/2007    Past Surgical History:  Procedure Laterality  Date   CATARACT EXTRACTION W/PHACO Right 08/16/2018   Procedure: CATARACT EXTRACTION PHACO AND INTRAOCULAR LENS PLACEMENT RIGHT EYE;  Surgeon: Baruch Goldmann, MD;  Location: [facility];  Service: Ophthalmology;  Laterality: Right;  CDE: 4.18   COLONOSCOPY  10/09/2006   3 mm pedunculated sigmoid colon polyp removed/8-mm sessile hepatic flexure polyp (tubular villous adenoma) removed/6-mm  descending colon polyp removed/small internal hemorrhoids   COLONOSCOPY  02/28/2011   [id number], multiple in the rectum/Internal hemorrhoids, MODERATE-CAUSING RECTAL BLEEDING   COLONOSCOPY N/A 07/05/2016    Procedure: COLONOSCOPY;  Surgeon: [name], MD;  Location: [facility];  Service: Endoscopy;  Laterality: N/A;  11:15am   ESOPHAGOGASTRODUODENOSCOPY N/A 07/21/2014   SLF: 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 Mild erosive gastrtitis most likely due to indomethacin    FINGER SURGERY Right    4th and 5th digit   FLEXIBLE SIGMOIDOSCOPY  01/02/2012   SLF: 1. the colonic mucosa appeared normal in the sigmoid colon 2. Large internal hemorrhoids: cause for rectal bleeding/pain . s/p banding X 3    HAND SURGERY     REVERSE SHOULDER ARTHROPLASTY Left 05/29/2018   Procedure: REVERSE SHOULDER ARTHROPLASTY;  Surgeon: Hiram Gash, MD;  Location: [facility];  Service: Orthopedics;  Laterality: Left;   SAVORY DILATION N/A 07/21/2014   Procedure: SAVORY DILATION;  Surgeon: [name], MD;  Location: [facility];  Service: Endoscopy;  Laterality: N/A;       Family History  Problem Relation Age of Onset   Hypertension Mother    Hypertension Father    Stroke Father    Hypertension Sister    Hypertension Brother    Cancer Brother    Cancer Brother        throat cancer   Hypertension Brother    Diabetes Maternal Grandmother    Colon cancer Neg Hx     Social History   Tobacco Use   Smoking status: Former    Pack years: 0.00    Types: Cigarettes    Quit date: 08/16/2004    Years since quitting: 16.1   Smokeless tobacco: Current   Tobacco comments:    no tobacco, has quit long time ago  Vaping Use   Vaping Use: Never used  Substance Use Topics   Alcohol use: Yes    Alcohol/week: 84.0 standard drinks    Types: 84 Cans of beer per week   Drug use: No    Home Medications Prior to Admission medications   Medication Sig Start Date End Date Taking? Authorizing Provider  aspirin EC 81 MG tablet Take 1 tablet (81 mg total) by mouth daily. Swallow whole. 08/05/20 08/05/21  Harvest Dark, MD  atorvastatin (LIPITOR) 20 MG tablet Take 1 tablet (20 mg total) by mouth  daily. 08/04/20   Clapacs, Madie Reno, MD  doxycycline (VIBRAMYCIN) 100 MG capsule Take 1 capsule (100 mg total) by mouth 2 (two) times daily. 08/21/20   Noemi Chapel, MD  hydrOXYzine (VISTARIL) 25 MG capsule Take 25 mg by mouth at bedtime as needed (insomnia).    [provider]  metoprolol tartrate (LOPRESSOR) 25 MG tablet Take 1 tablet (25 mg total) by mouth 2 (two) times daily. 08/05/20 08/05/21  Harvest Dark, MD  OLANZapine (ZYPREXA) 7.5 MG tablet Take 1 tablet (7.5 mg total) by mouth at bedtime. 08/05/20 08/05/21  Harvest Dark, MD  potassium chloride (KLOR-CON) 10 MEQ tablet Take 1 tablet (10 mEq total) by mouth daily. 08/21/20   Noemi Chapel, MD  tetrahydrozoline-zinc [facility])  0.05-0.25 % ophthalmic solution Place 2 drops into both eyes 3 (three) times daily as needed.    [provider]  thiamine (VITAMIN B-1) 50 MG tablet Take 1 tablet (50 mg total) by mouth daily. 08/04/20   Clapacs, Madie Reno, MD    Allergies    Hydrocodone, Benadryl [diphenhydramine hcl (sleep)], and Hydrocortisone  Review of Systems   Review of Systems  Constitutional:  Negative for fever.  Musculoskeletal:  Positive for arthralgias. Negative for joint swelling.  Neurological:  Negative for weakness.  All other systems reviewed and are negative.  Physical Exam Updated Vital Signs BP (!) 132/92   Pulse 85   Temp 98.2 F (36.8 C) (Oral)   Resp 18   Ht 6\' 2"  (1.88 m)   Wt 79.4 kg   SpO2 100%   BMI 22.47 kg/m   Physical Exam Vitals and nursing note reviewed.  Constitutional:      Appearance: He is well-developed.  HENT:     Head: Normocephalic and atraumatic.     Right Ear: External ear normal.     Left Ear: External ear normal.     Nose: Nose normal.  Eyes:     General:        Right eye: No discharge.        Left eye: No discharge.  Cardiovascular:     Rate and Rhythm: Normal rate and regular rhythm.     Pulses:          Radial pulses are 2+ on the right side.        Dorsalis pedis pulses are 2+ on the right side.     Heart sounds: Normal heart sounds.  Pulmonary:     Effort: Pulmonary effort is normal.     Breath sounds: Normal breath sounds.  Abdominal:     Palpations: Abdomen is soft.     Tenderness: There is no abdominal tenderness.  Musculoskeletal:     Cervical back: Neck supple.     Comments: No tenderness, swelling, or obvious cellulitis/warmth to bilateral wrists and bilateral knees.  Normal range of motion. At the distal plantar foot on the right side there is a shallow ulcer with granulation tissue under.  No cellulitis or drainage.  Skin:    General: Skin is warm and dry.  Neurological:     Mental Status: He is alert.     Comments: 5/5 strength in all 4 extremities. Diffuse sensation throughout both legs. He is able to ambulate. He is stiff when he first gets up due to his knees hurting but then is able to walk normally.  Psychiatric:        Mood and Affect: Mood is not anxious.    ED Results / Procedures / Treatments   Labs (all labs ordered are listed, but only abnormal results are displayed) Labs Reviewed - No data to display  EKG None  Radiology No results found.  Procedures Procedures   Medications Ordered in ED Medications  ibuprofen (ADVIL) tablet 600 mg (600 mg Oral Given 09/21/20 0852)  acetaminophen (TYLENOL) tablet 1,000 mg (1,000 mg Oral Given 09/21/20 8786)    ED Course  I have reviewed the triage vital signs and the nursing notes.  Pertinent labs & imaging results that were available during my care of the patient were reviewed by me and considered in my medical decision making (see chart for details).    MDM Rules/Calculators/A&P  Patient is complaining of diffuse body pain for quite some time.  No focal significant abnormalities that would warrant further emergent work-up or treatment.  No obvious joint effusions or infections.  The wound on his foot looks okay.  He was given  ibuprofen and Tylenol.  He is requesting to speak with a Education officer, museum, who has seen him and given him resources but there is no ability to admit him straight to a rehab facility as he is ambulatory.  He is also noting that he has been having some rectal discomfort for quite some time.  Has some difficulty with bowel movements.  I performed a rectal exam which does not show an obvious external hemorrhoid, fissure, mass, etc.  Perhaps he has an internal hemorrhoid and given the length of time of his symptoms (months to years) I discussed he needs to follow-up with his gastroenterologist.  He states he can follow-up with Dr. Oneida Alar as he is due for a colonoscopy.  Given return precautions but he otherwise appears stable for discharge home. Final Clinical Impression(s) / ED Diagnoses Final diagnoses:  Arthralgia, unspecified joint    Rx / DC Orders ED Discharge Orders     None        Sherwood Gambler, MD 09/21/20 1013

## 2020-09-21 NOTE — ED Triage Notes (Signed)
Pt tot the ED via [facility] for osteoarthritic pain.  The pt also has a sore on the bottom of his right foot.

## 2020-09-21 NOTE — Progress Notes (Signed)
Transition of Care [facility]) - Emergency Department Mini Assessment   Patient Details  Name: Tristan Cook MRN: [id number] Date of Birth: [birth year]  [facility] CM/SW Contact:    Iona Beard, [place] Phone Number: 09/21/2020, 9:31 AM   Clinical Narrative: TOC consulted to speak with pt due to him being homeless and requesting rehab. CSW spoke with pt about if he has family or any supports. He states he does have family but no contact and that he does not have any supports. CSW informed pt that we are not able to place long term from the hospital however, CSW provided pt with Medicare.gov list so that he can look into facilities. CSW also provided pt with the number for DSS, CSW educated pt to inquire about Medicaid. CSW also informed pt that he may be able to go to the [employer] to use their resources like a computer. CSW provided papers to pts RN to put with his d/c packet. CSW spoke with pts Attending MD to update on resources provided. TOC signing off.    ED Mini Assessment: What brought you to the Emergency Department? 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   Acute psychosis ([place]) 06/05/2019   Delusional disorder ([place]) 05/24/2019   Swelling of limb 03/11/2019   Peripheral polyneuropathy 02/12/2019   Alcohol intoxication ([place]) 08/27/2018   Hallucinations 08/27/2018   MDD (major depressive  disorder), recurrent, severe, with psychosis ([place]) 08/27/2018   Alcohol use disorder, severe, dependence ([place]) 08/27/2018   TIA (transient ischemic attack) 05/29/2018   Syncope 05/29/2018   Alcohol abuse 05/29/2018   Fall 05/29/2018   Dislocation of left shoulder joint 05/29/2018   Closed fracture of left proximal humerus 05/29/2018   Abnormal LFTs 05/29/2018   Hypokalemia 05/29/2018   Constipation 10/17/2017   Hoarseness 10/17/2017   Weakness 04/11/2017   Weakness of both legs 04/10/2017   Vitamin B deficiency 01/22/2017   Lead exposure 01/22/2017   Stroke, small vessel ([place]) 69/62/9528   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   Vitamin B12 deficiency neuropathy ([place]) 01/17/2017   Adjustment disorder with depressed mood 08/15/2016   History of colonic polyps 06/14/2016   Elevated blood pressure reading 06/14/2016   Hemorrhoids 09/20/2015   Depressive disorder 08/03/2015   Back pain with radiation 06/15/2015   Insomnia 06/03/2015   Hyperlipidemia LDL goal <130 04/25/2015   Low serum testosterone 04/16/2015   Fatigue 03/29/2015   Allergic rhinitis 02/10/2015   Esophageal stricture 10/22/2014   Overweight (BMI 25.0-29.9) 10/06/2013   GERD (gastroesophageal reflux disease) 04/23/2012   Hemorrhoids, internal 11/02/2011   Essential hypertension 07/25/2007   Neck pain on left side 07/25/2007   PCP:  Pcp, No Pharmacy:   [facility] [address], [place] - [place] [address] Phone: [phone] Fax: [facility] [address] [place], [place]  [address] [place] [address] Phone: [phone] Fax: [phone]

## 2020-12-11 DEATH — deceased

## 2021-06-28 ENCOUNTER — Encounter: Payer: Self-pay | Admitting: *Deleted

## 2022-01-09 ENCOUNTER — Encounter (INDEPENDENT_AMBULATORY_CARE_PROVIDER_SITE_OTHER): Payer: Self-pay
